# Patient Record
Sex: Female | Born: 1950
Health system: Southern US, Community
[De-identification: ages and names within clinical notes are randomized; demographics above are authoritative.]

## PROBLEM LIST (undated history)

## (undated) DIAGNOSIS — I509 Heart failure, unspecified: Secondary | ICD-10-CM

## (undated) DIAGNOSIS — Z8719 Personal history of other diseases of the digestive system: Secondary | ICD-10-CM

## (undated) DIAGNOSIS — M549 Dorsalgia, unspecified: Secondary | ICD-10-CM

## (undated) DIAGNOSIS — M1990 Unspecified osteoarthritis, unspecified site: Secondary | ICD-10-CM

## (undated) DIAGNOSIS — E785 Hyperlipidemia, unspecified: Secondary | ICD-10-CM

## (undated) DIAGNOSIS — E119 Type 2 diabetes mellitus without complications: Secondary | ICD-10-CM

## (undated) DIAGNOSIS — J449 Chronic obstructive pulmonary disease, unspecified: Secondary | ICD-10-CM

## (undated) DIAGNOSIS — I10 Essential (primary) hypertension: Secondary | ICD-10-CM

## (undated) DIAGNOSIS — G8929 Other chronic pain: Secondary | ICD-10-CM

## (undated) DIAGNOSIS — I639 Cerebral infarction, unspecified: Secondary | ICD-10-CM

## (undated) DIAGNOSIS — I471 Supraventricular tachycardia, unspecified: Secondary | ICD-10-CM

## (undated) DIAGNOSIS — I5030 Unspecified diastolic (congestive) heart failure: Secondary | ICD-10-CM

## (undated) DIAGNOSIS — J849 Interstitial pulmonary disease, unspecified: Secondary | ICD-10-CM

## (undated) DIAGNOSIS — N184 Chronic kidney disease, stage 4 (severe): Secondary | ICD-10-CM

## (undated) DIAGNOSIS — Z8673 Personal history of transient ischemic attack (TIA), and cerebral infarction without residual deficits: Secondary | ICD-10-CM

## (undated) HISTORY — PX: ABDOMINAL HYSTERECTOMY: SHX81

## (undated) HISTORY — DX: Cerebral infarction, unspecified: I63.9

## (undated) HISTORY — PX: HAND SURGERY: SHX662

## (undated) HISTORY — PX: OTHER SURGICAL HISTORY: SHX169

---

## 2001-02-17 ENCOUNTER — Emergency Department (HOSPITAL_COMMUNITY): Admission: EM | Admit: 2001-02-17 | Discharge: 2001-02-17 | Payer: Self-pay | Admitting: Emergency Medicine

## 2001-02-17 ENCOUNTER — Encounter: Payer: Self-pay | Admitting: Emergency Medicine

## 2001-04-19 ENCOUNTER — Emergency Department (HOSPITAL_COMMUNITY): Admission: EM | Admit: 2001-04-19 | Discharge: 2001-04-19 | Payer: Self-pay | Admitting: *Deleted

## 2001-07-13 ENCOUNTER — Emergency Department (HOSPITAL_COMMUNITY): Admission: EM | Admit: 2001-07-13 | Discharge: 2001-07-13 | Payer: Self-pay | Admitting: *Deleted

## 2001-09-04 ENCOUNTER — Ambulatory Visit (HOSPITAL_COMMUNITY): Admission: RE | Admit: 2001-09-04 | Discharge: 2001-09-04 | Payer: Self-pay | Admitting: Internal Medicine

## 2001-09-04 ENCOUNTER — Encounter: Payer: Self-pay | Admitting: Internal Medicine

## 2001-09-15 ENCOUNTER — Encounter: Payer: Self-pay | Admitting: Internal Medicine

## 2001-09-15 ENCOUNTER — Ambulatory Visit (HOSPITAL_COMMUNITY): Admission: RE | Admit: 2001-09-15 | Discharge: 2001-09-15 | Payer: Self-pay | Admitting: Internal Medicine

## 2001-09-30 ENCOUNTER — Encounter: Payer: Self-pay | Admitting: Internal Medicine

## 2001-09-30 ENCOUNTER — Ambulatory Visit (HOSPITAL_COMMUNITY): Admission: RE | Admit: 2001-09-30 | Discharge: 2001-09-30 | Payer: Self-pay | Admitting: Internal Medicine

## 2001-10-14 ENCOUNTER — Encounter (HOSPITAL_COMMUNITY): Admission: RE | Admit: 2001-10-14 | Discharge: 2001-11-13 | Payer: Self-pay | Admitting: Internal Medicine

## 2001-11-15 ENCOUNTER — Encounter (HOSPITAL_COMMUNITY): Admission: RE | Admit: 2001-11-15 | Discharge: 2001-12-15 | Payer: Self-pay | Admitting: Internal Medicine

## 2001-11-25 ENCOUNTER — Encounter: Payer: Self-pay | Admitting: Internal Medicine

## 2001-11-25 ENCOUNTER — Ambulatory Visit (HOSPITAL_COMMUNITY): Admission: RE | Admit: 2001-11-25 | Discharge: 2001-11-25 | Payer: Self-pay | Admitting: Internal Medicine

## 2001-12-02 ENCOUNTER — Encounter: Payer: Self-pay | Admitting: Internal Medicine

## 2001-12-02 ENCOUNTER — Ambulatory Visit (HOSPITAL_COMMUNITY): Admission: RE | Admit: 2001-12-02 | Discharge: 2001-12-02 | Payer: Self-pay | Admitting: Internal Medicine

## 2002-06-30 ENCOUNTER — Ambulatory Visit (HOSPITAL_COMMUNITY): Admission: RE | Admit: 2002-06-30 | Discharge: 2002-06-30 | Payer: Self-pay | Admitting: Internal Medicine

## 2002-06-30 ENCOUNTER — Encounter: Payer: Self-pay | Admitting: Internal Medicine

## 2002-07-10 ENCOUNTER — Encounter: Payer: Self-pay | Admitting: Internal Medicine

## 2002-07-10 ENCOUNTER — Ambulatory Visit (HOSPITAL_COMMUNITY): Admission: RE | Admit: 2002-07-10 | Discharge: 2002-07-10 | Payer: Self-pay | Admitting: Internal Medicine

## 2003-09-22 ENCOUNTER — Ambulatory Visit (HOSPITAL_COMMUNITY): Admission: RE | Admit: 2003-09-22 | Discharge: 2003-09-22 | Payer: Self-pay | Admitting: General Surgery

## 2003-09-29 ENCOUNTER — Ambulatory Visit (HOSPITAL_COMMUNITY): Admission: RE | Admit: 2003-09-29 | Discharge: 2003-09-29 | Payer: Self-pay | Admitting: General Surgery

## 2003-12-25 ENCOUNTER — Ambulatory Visit (HOSPITAL_COMMUNITY): Admission: RE | Admit: 2003-12-25 | Discharge: 2003-12-25 | Payer: Self-pay | Admitting: General Surgery

## 2004-01-19 ENCOUNTER — Encounter (HOSPITAL_COMMUNITY): Admission: RE | Admit: 2004-01-19 | Discharge: 2004-01-20 | Payer: Self-pay | Admitting: Internal Medicine

## 2004-01-21 ENCOUNTER — Ambulatory Visit (HOSPITAL_COMMUNITY): Admission: RE | Admit: 2004-01-21 | Discharge: 2004-01-21 | Payer: Self-pay | Admitting: General Surgery

## 2004-02-25 ENCOUNTER — Emergency Department (HOSPITAL_COMMUNITY): Admission: EM | Admit: 2004-02-25 | Discharge: 2004-02-25 | Payer: Self-pay | Admitting: Emergency Medicine

## 2004-08-15 ENCOUNTER — Ambulatory Visit (HOSPITAL_COMMUNITY): Admission: RE | Admit: 2004-08-15 | Discharge: 2004-08-15 | Payer: Self-pay | Admitting: General Surgery

## 2004-09-25 ENCOUNTER — Emergency Department (HOSPITAL_COMMUNITY): Admission: EM | Admit: 2004-09-25 | Discharge: 2004-09-25 | Payer: Self-pay | Admitting: Emergency Medicine

## 2005-03-03 ENCOUNTER — Ambulatory Visit (HOSPITAL_COMMUNITY): Admission: RE | Admit: 2005-03-03 | Discharge: 2005-03-03 | Payer: Self-pay | Admitting: General Surgery

## 2005-06-02 ENCOUNTER — Emergency Department (HOSPITAL_COMMUNITY): Admission: EM | Admit: 2005-06-02 | Discharge: 2005-06-02 | Payer: Self-pay | Admitting: Emergency Medicine

## 2005-08-05 ENCOUNTER — Emergency Department (HOSPITAL_COMMUNITY): Admission: EM | Admit: 2005-08-05 | Discharge: 2005-08-06 | Payer: Self-pay | Admitting: Emergency Medicine

## 2005-10-13 ENCOUNTER — Ambulatory Visit (HOSPITAL_COMMUNITY): Admission: RE | Admit: 2005-10-13 | Discharge: 2005-10-13 | Payer: Self-pay | Admitting: General Surgery

## 2006-01-17 ENCOUNTER — Ambulatory Visit (HOSPITAL_COMMUNITY): Admission: RE | Admit: 2006-01-17 | Discharge: 2006-01-17 | Payer: Self-pay | Admitting: Family Medicine

## 2006-03-20 ENCOUNTER — Ambulatory Visit (HOSPITAL_COMMUNITY): Admission: RE | Admit: 2006-03-20 | Discharge: 2006-03-20 | Payer: Self-pay | Admitting: General Surgery

## 2006-10-20 ENCOUNTER — Emergency Department (HOSPITAL_COMMUNITY): Admission: EM | Admit: 2006-10-20 | Discharge: 2006-10-20 | Payer: Self-pay | Admitting: Emergency Medicine

## 2007-03-14 ENCOUNTER — Ambulatory Visit (HOSPITAL_COMMUNITY): Admission: RE | Admit: 2007-03-14 | Discharge: 2007-03-14 | Payer: Self-pay | Admitting: Family Medicine

## 2007-04-16 ENCOUNTER — Ambulatory Visit (HOSPITAL_COMMUNITY): Admission: RE | Admit: 2007-04-16 | Discharge: 2007-04-16 | Payer: Self-pay | Admitting: Family Medicine

## 2007-09-18 ENCOUNTER — Emergency Department (HOSPITAL_COMMUNITY): Admission: EM | Admit: 2007-09-18 | Discharge: 2007-09-19 | Payer: Self-pay | Admitting: *Deleted

## 2007-09-26 HISTORY — PX: COLONOSCOPY: SHX174

## 2007-09-26 HISTORY — PX: OTHER SURGICAL HISTORY: SHX169

## 2007-11-29 ENCOUNTER — Emergency Department (HOSPITAL_COMMUNITY): Admission: EM | Admit: 2007-11-29 | Discharge: 2007-11-29 | Payer: Self-pay | Admitting: Emergency Medicine

## 2008-01-22 ENCOUNTER — Ambulatory Visit (HOSPITAL_COMMUNITY): Admission: RE | Admit: 2008-01-22 | Discharge: 2008-01-22 | Payer: Self-pay | Admitting: Internal Medicine

## 2008-01-22 ENCOUNTER — Ambulatory Visit: Payer: Self-pay | Admitting: Internal Medicine

## 2008-01-22 ENCOUNTER — Encounter: Payer: Self-pay | Admitting: Internal Medicine

## 2008-05-05 ENCOUNTER — Emergency Department (HOSPITAL_COMMUNITY): Admission: EM | Admit: 2008-05-05 | Discharge: 2008-05-05 | Payer: Self-pay | Admitting: Emergency Medicine

## 2009-08-16 ENCOUNTER — Other Ambulatory Visit: Admission: RE | Admit: 2009-08-16 | Discharge: 2009-08-16 | Payer: Self-pay | Admitting: Family Medicine

## 2010-03-31 ENCOUNTER — Ambulatory Visit (HOSPITAL_COMMUNITY): Admission: RE | Admit: 2010-03-31 | Discharge: 2010-03-31 | Payer: Self-pay | Admitting: Family Medicine

## 2010-06-30 ENCOUNTER — Emergency Department (HOSPITAL_COMMUNITY)
Admission: EM | Admit: 2010-06-30 | Discharge: 2010-06-30 | Payer: Self-pay | Source: Home / Self Care | Admitting: Emergency Medicine

## 2010-07-27 ENCOUNTER — Encounter: Payer: Self-pay | Admitting: Orthopedic Surgery

## 2010-08-04 ENCOUNTER — Ambulatory Visit: Payer: Self-pay | Admitting: Orthopedic Surgery

## 2010-08-04 DIAGNOSIS — M7020 Olecranon bursitis, unspecified elbow: Secondary | ICD-10-CM

## 2010-08-29 ENCOUNTER — Emergency Department (HOSPITAL_COMMUNITY)
Admission: EM | Admit: 2010-08-29 | Discharge: 2010-08-30 | Payer: Self-pay | Source: Home / Self Care | Admitting: Emergency Medicine

## 2010-10-16 ENCOUNTER — Encounter: Payer: Self-pay | Admitting: Family Medicine

## 2010-10-16 ENCOUNTER — Encounter: Payer: Self-pay | Admitting: General Surgery

## 2010-10-25 NOTE — Assessment & Plan Note (Signed)
Summary: left elbow effusion needs xr/uhc/hill/bsf   Vital Signs:  Patient profile:   60 year old female Height:      65 inches Weight:      190 pounds Pulse rate:   72 / minute Resp:     16 per minute  Vitals Entered By: Arther Abbott MD (August 04, 2010 8:59 AM)  Visit Type:  new patient Referring Provider:  Dr. Iona Beard Primary Provider:  Dr. Iona Beard  CC:  left elbow.  History of Present Illness: I saw Erica [name] in the office today for an initial visit.  She is a 60 years old woman with the complaint of:  left elbow pain and swelling.  No Injury.  Xrays today.  Meds: Oxycontin 40mg , Diovan+/HCTZ, Lipitor, Tekturna, Onglyza, ASA.  This is a 60 year old, diabetic female, with 2 months of pain and swelling over the LEFT elbow. No associated trauma. She complains of constant 10 out of 10 sharp pain over the LEFT elbow with no evidence of previous elbow pressure or resting arms on anything. Of note. She is diabetic   Allergies (verified): No Known Drug Allergies  Past History:  Past Medical History: Back pain htn arthritis Diabetes Bronchitis  Past Surgical History: left middle finger  Family History: Family History of Diabetes Family History of Arthritis  Social History: Patient is divorced.  unemployed smokes 1/2 ppd cigs no alcohol coffee in the am 11th grade ed.  Review of Systems Constitutional:  Denies weight loss, weight gain, fever, chills, and fatigue. Cardiovascular:  Denies chest pain, palpitations, fainting, and murmurs. Respiratory:  Complains of wheezing and couch; denies short of breath, tightness, pain on inspiration, and snoring . Gastrointestinal:  Denies heartburn, nausea, vomiting, diarrhea, constipation, and blood in your stools. Genitourinary:  Complains of frequency; denies urgency, difficulty urinating, painful urination, flank pain, and bleeding in urine. Neurologic:  Denies numbness, tingling, unsteady gait,  dizziness, tremors, and seizure. Musculoskeletal:  Complains of joint pain; denies swelling, instability, stiffness, redness, heat, and muscle pain. Endocrine:  Denies excessive thirst, exessive urination, and heat or cold intolerance. Psychiatric:  Denies nervousness, depression, anxiety, and hallucinations. Skin:  Denies changes in the skin, poor healing, rash, itching, and redness. HEENT:  Complains of watering; denies blurred or double vision, eye pain, and redness. Immunology:  Denies seasonal allergies, sinus problems, and allergic to bee stings. Hemoatologic:  Denies easy bleeding and brusing.  Physical Exam  Skin:  intact without lesions or rashes, skin of LEFT arm and elbow. Dark. Axillary Nodes:  tender axillary nodes without streaking Psych:  alert and cooperative; normal mood and affect; normal attention span and concentration   Shoulder/Elbow Exam  General:    Well-developed, well-nourished, normal body habitus; no deformities, normal grooming.    Inspection:    swelling: LEFT elbow, LEFT forearm  Palpation:     tenderness LEFT elbow, and forearm  Vascular:    Radial, ulnar, brachial, and axillary pulses 2+ and symmetric; capillary refill less than 2 seconds; no evidence of ischemia, clubbing, or cyanosis.    Sensory:    Gross sensation intact in the upper extremities.    Motor:    Normal strength in the upper extremities.    Elbow Exam:    Left:    Stability:  stable    Inspection/Palpation:  full flexion, extension of the elbow with discomfort. Large mass over the LEFT elbow associated with bursitis   Impression & Recommendations:  Problem # 1:  OLECRANON BURSITIS, LEFT (ICD-726.33) Assessment New  x-ray AP, lateral, LEFT elbow, show some soft tissue swelling over the LEFT olecranon with a small olecranon spur. There is mild arthritis on the ulnar side of the joint with a small spur there as well. Ulnohumeral joint is normal.  Aspiration injection LEFT  elbow bursa through  After sterile prep and drape in use of ethyl chloride for anesthesia. Approximately 4 cc of yellow orange fluid was aspirated from the bursa. There were no infectious material.  We injected a cc of Depo-Medrol and lidocaine mixture.  Application of Ace wrap.  Orders: New Patient Level III [id number]) Elbow x-ray, 2 views [id number]) Joint Aspirate / Injection, Intermediate (20605) Depo- Medrol 40mg  (J1030)  Patient Instructions: 1)  Wear ace 48 hrs  2)  Ice for 20 min three times a day x 48 hrs  3)  return as needed    Orders Added: 1)  New Patient Level III [id number] 2)  Elbow x-ray, 2 views [id number] 3)  Joint Aspirate / Injection, Intermediate [id number] 4)  Depo- Medrol 40mg  [id number]

## 2010-10-25 NOTE — Letter (Signed)
Summary: *Orthopedic Consult Note  [facility]  [address] [place] [address]  [place], [place] [address]   Phone: [phone]  Fax: [phone]    Re:    Erica [name] DOB:    July 31, 1951   Dear: Berneta Sages    Thank you for requesting that we see the above patient for consultation.  A copy of the detailed office note will be sent under separate cover, for your review.  Evaluation today is consistent with:  1)  OLECRANON BURSITIS, LEFT (ICD-726.33) 2)  FAMILY HISTORY OF ARTHRITIS (ICD-V17.7) 3)  FAMILY HISTORY OF DIABETES (ICD-V18.0)   an x-ray was obtained, and it was essentially normal except for soft tissue swelling, and some mild arthritis.  We also aspirated and injected the LEFT elbow bursa.         Thank you for this opportunity to look after your patient.  Sincerely,   Demetrius Revel. MD.

## 2010-10-28 NOTE — Letter (Signed)
Summary: history form   history form   Imported By: Ruffin Pyo 08/04/2010 10:32:00  _____________________________________________________________________  External Attachment:    Type:   Image     Comment:   External Document

## 2010-12-05 LAB — RAPID STREP SCREEN (MED CTR MEBANE ONLY): Streptococcus, Group A Screen (Direct): POSITIVE — AB

## 2011-02-07 NOTE — Op Note (Signed)
Erica [name], Erica [name]              ACCOUNT NO.:  [id number]   MEDICAL RECORD NO.:  [id number]          PATIENT TYPE:  AMB   LOCATION:  DAY                           FACILITY:  [facility]   PHYSICIAN:  R. Garfield Cornea, M.D. DATE OF BIRTH:  [birth year]   DATE OF PROCEDURE:  01/22/2008  DATE OF DISCHARGE:                               OPERATIVE REPORT   Colonoscopy with biopsy.   INDICATIONS FOR PROCEDURE:  The patient is a very pleasant 60 year old  [place] American female sent over courtesy of  Zannie Cove, PAC at  [facility] in [place], [place] for  colorectal cancer screening via colonoscopy.  She has never had her  lower GI tract imaged, she has no lower GI tract symptoms.  There is no  family history of colorectal neoplasia.  Colonoscopy is now being done  as a screening maneuver.  The risks, benefits, alternatives, and  limitations have been reviewed.  Questions answered and all parties  agreeable.   PROCEDURE NOTE:  O2 saturation, blood pressure, pulse, and respirations  monitored throughout the entire procedure.   CONSCIOUS SEDATION:  1. Versed 3 mg IV.  2. Demerol 75 mg IV in divided doses.   INSTRUMENT:  Pentax video chip system.   FINDINGS:  Digital rectal exam revealed no abnormalities.  Endoscopic  findings:  The prep was good.  Colon:  Colonic mucosa was surveyed from  the rectosigmoid junction through the left, transverse, and right colon,  to the appendiceal orifice, ileocecal valve, and cecum.  These  structures were well seen and photographed for the record.  From this  level, scope was slowly withdrawn.  All previously mentioned mucosal  surfaces were again seen.  The patient had 2 diminutive polyps in the  rectosigmoid junction.  The  remainder of the colonic mucosa appeared  normal.  These were cold biopsied/removed.  The scope was pulled down  the rectum where thorough examination of rectal mucosa, including  retroflexed view of  anal verge, demonstrated cluster of distal  diminutive rectal polyps.  Four were cold biopsied/removed.  The  reminder of the rectal mucosa appeared normal.  The patient tolerated  the procedure well and was reactive in endoscopy.   IMPRESSION:  1. Distal diminutive rectal polyps status post cold biopsy/removal.  2. Rectosigmoid polyps diminutive lesions, cold biopsy/removal.  3. The remainder of colonic mucosa appeared normal.   RECOMMENDATIONS:  1. We will follow up on path.  2. Further recommendations to follow.      Bridgette Habermann, M.D.  Electronically Signed     RMR/MEDQ  D:  01/22/2008  T:  01/23/2008  Job:  [id number]   cc:   Zannie Cove, [place], [place]

## 2011-02-10 NOTE — Procedures (Signed)
Erica [name], Erica [name]                        ACCOUNT NO.:  [id number]   MEDICAL RECORD NO.:  [id number]                   PATIENT TYPE:  OUT   LOCATION:  RAD                                  FACILITY:  [facility]   PHYSICIAN:  Leslye Peer, MD                    DATE OF BIRTH:  08-Dec-1950   DATE OF PROCEDURE:  DATE OF DISCHARGE:                                  ECHOCARDIOGRAM   INDICATION:  Ms. Kostiuk is a 60 year old female smoker, who has hypertension  and has been experiencing lower-extremity edema as well as cough.  She is  referred to evaluate for the possibility of congestive heart failure.   TECHNICAL QUALITY:  Adequate.   FINDINGS:  1. The aorta is within normal limits at 2.4 cm.  2. The left atrium is also at the upper limits of normal at 3.9 cm.  3. The patient appeared to be in sinus rhythm during this procedure.  4. The intraventricular septum and posterior wall are both mildly thickened     at 1.3 cm for each.  5. The aortic valve is not completely visualized, but appears grossly     structurally normal with normal leaflet excursion.  No aortic     insufficiency is appreciated.  6. Doppler interrogation of the aortic valve is within normal limits.  7. The mitral valve also appears grossly structurally normal, although it     may be mildly thickened. Leaflet excursion is normal.  There is a subtle     suggestion of prolapse of the anterior leaflet of the mitral valve, but     there is some dropout obscuring this definitive diagnosis.  There is no     significant mitral regurgitant lesion noted.  Doppler interrogation of     the mitral valve is within normal limits.  8. The pulmonic valve is incompletely visualized, but appears grossly     structurally normal.  9. Trivial pulmonic insufficiency is noted.  10.      The tricuspid valve also appears grossly structurally normal, and     no significant tricuspid regurgitation is noted.  11.      The left ventricle is normal in  size with the LBIDD measuring 4.1     cm, and the LVISD measured at 2.5 cm.  Overall left ventricular systolic     function appears normal to vigorous, and no regional wall motion     abnormalities are noted.  There is a subtle suggestion of mild diastolic     dysfunction by pulse wave Doppler across the mitral valve.  12.      The right-sided structures appear normal in size.  The right     ventricular systolic function appears normal.  13.      The IVC is not well visualized.  There is a small posterior and     apical pericardial effusion without evidence of  hemodynamic compromise/     tamponade.  I can not exclude the possibility of a small anterior     pericardial effusion as well, but this is not well visualized.   IMPRESSION:  1. Mild, concentric left ventricular hypertrophy.  2. Trivial pulmonic insufficiency.  3. Subtle suggestion of prolapse of the anterior leaflet of the mitral     valve, although there is some dropout to preclude definitive diagnosis;     however, there is no significant mitral regurgitant lesion noted.  4. Normal left ventricular size with normal-to-vigorous left ventricular     systolic function, and no regional wall motion abnormalities noted.  5. There is a suggestion of mild diastolic dysfunction.  6. Small posterior and apical pericardial effusion without evidence of     hemodynamic compromise/ tamponade.  I can not exclude the possibility of     a small anterior pericardial effusion as well.      ___________________________________________                                            Leslye Peer, MD   AB/MEDQ  D:  12/25/2003  T:  12/26/2003  Job:  [id number]   cc:   Vernon Prey. Tamala Julian, M.D.  [address]  [place]  [place] [address]  Fax: [phone]

## 2011-06-19 LAB — URINALYSIS, ROUTINE W REFLEX MICROSCOPIC
Bilirubin Urine: NEGATIVE
Glucose, UA: NEGATIVE
Hgb urine dipstick: NEGATIVE
Ketones, ur: NEGATIVE
Nitrite: NEGATIVE
Specific Gravity, Urine: 1.025
pH: 5

## 2011-06-19 LAB — COMPREHENSIVE METABOLIC PANEL
ALT: 19
AST: 15
Albumin: 4
Alkaline Phosphatase: 72
Calcium: 9.3
GFR calc Af Amer: 54 — ABNORMAL LOW
Glucose, Bld: 121 — ABNORMAL HIGH
Potassium: 4.2
Sodium: 138
Total Protein: 7.1

## 2011-06-19 LAB — DIFFERENTIAL
Basophils Absolute: 0
Basophils Relative: 0
Lymphocytes Relative: 21
Monocytes Absolute: 0.5
Neutro Abs: 4.8
Neutrophils Relative %: 70

## 2011-06-19 LAB — CBC
Hemoglobin: 15.3 — ABNORMAL HIGH
RBC: 5.36 — ABNORMAL HIGH
RDW: 13.8

## 2011-06-19 LAB — LIPASE, BLOOD: Lipase: 23

## 2011-06-30 LAB — URINALYSIS, ROUTINE W REFLEX MICROSCOPIC
Bilirubin Urine: NEGATIVE
Glucose, UA: NEGATIVE
Hgb urine dipstick: NEGATIVE
Specific Gravity, Urine: 1.025
Urobilinogen, UA: 0.2
pH: 5.5

## 2011-06-30 LAB — LIPASE, BLOOD: Lipase: 24

## 2011-06-30 LAB — CBC
Hemoglobin: 14.7
MCHC: 34.4
MCV: 81.4
RBC: 5.26 — ABNORMAL HIGH
WBC: 6.8

## 2011-06-30 LAB — COMPREHENSIVE METABOLIC PANEL
ALT: 18
AST: 22
CO2: 29
Chloride: 101
Creatinine, Ser: 0.81
GFR calc Af Amer: >60
GFR calc non Af Amer: >60
Glucose, Bld: 131 — ABNORMAL HIGH
Total Bilirubin: 1.1

## 2011-06-30 LAB — DIFFERENTIAL
Basophils Absolute: 0
Eosinophils Absolute: 0.1
Eosinophils Relative: 2
Neutrophils Relative %: 46

## 2013-03-14 ENCOUNTER — Emergency Department (HOSPITAL_COMMUNITY)
Admission: EM | Admit: 2013-03-14 | Discharge: 2013-03-14 | Disposition: A | Discharge status: Home / Self Care | Payer: Medicare Other | Attending: Emergency Medicine | Admitting: Emergency Medicine

## 2013-03-14 ENCOUNTER — Emergency Department (HOSPITAL_COMMUNITY): Payer: Medicare Other

## 2013-03-14 ENCOUNTER — Encounter (HOSPITAL_COMMUNITY): Payer: Self-pay | Admitting: *Deleted

## 2013-03-14 DIAGNOSIS — M545 Low back pain, unspecified: Secondary | ICD-10-CM | POA: Insufficient documentation

## 2013-03-14 DIAGNOSIS — Z90710 Acquired absence of both cervix and uterus: Secondary | ICD-10-CM | POA: Insufficient documentation

## 2013-03-14 DIAGNOSIS — J449 Chronic obstructive pulmonary disease, unspecified: Secondary | ICD-10-CM | POA: Insufficient documentation

## 2013-03-14 DIAGNOSIS — N949 Unspecified condition associated with female genital organs and menstrual cycle: Secondary | ICD-10-CM | POA: Insufficient documentation

## 2013-03-14 DIAGNOSIS — J4489 Other specified chronic obstructive pulmonary disease: Secondary | ICD-10-CM | POA: Insufficient documentation

## 2013-03-14 DIAGNOSIS — R109 Unspecified abdominal pain: Secondary | ICD-10-CM | POA: Insufficient documentation

## 2013-03-14 DIAGNOSIS — G8929 Other chronic pain: Secondary | ICD-10-CM | POA: Insufficient documentation

## 2013-03-14 DIAGNOSIS — F17200 Nicotine dependence, unspecified, uncomplicated: Secondary | ICD-10-CM | POA: Insufficient documentation

## 2013-03-14 HISTORY — DX: Other chronic pain: G89.29

## 2013-03-14 HISTORY — DX: Dorsalgia, unspecified: M54.9

## 2013-03-14 HISTORY — DX: Chronic obstructive pulmonary disease, unspecified: J44.9

## 2013-03-14 LAB — URINALYSIS, ROUTINE W REFLEX MICROSCOPIC
Bilirubin Urine: NEGATIVE
Ketones, ur: NEGATIVE mg/dL
Nitrite: NEGATIVE
Protein, ur: NEGATIVE mg/dL
Urobilinogen, UA: 0.2 mg/dL (ref 0.0–1.0)

## 2013-03-14 MED ORDER — CYCLOBENZAPRINE HCL 10 MG PO TABS: 10.0000 mg | ORAL_TABLET | Freq: Two times a day (BID) | ORAL | Status: DC | PRN

## 2013-03-14 MED ORDER — NAPROXEN 500 MG PO TABS: 500.0000 mg | ORAL_TABLET | Freq: Two times a day (BID) | ORAL | Status: DC

## 2013-03-14 MED ORDER — OXYCODONE-ACETAMINOPHEN 5-325 MG PO TABS
2.0000 | ORAL_TABLET | Freq: Once | ORAL | Status: AC
  Administered 2013-03-14: 2 via ORAL
  Filled 2013-03-14: qty 2

## 2013-03-14 NOTE — ED Notes (Signed)
Pt states left flank pain since last night. Denies urinary symptoms or vomiting. Pt states nausea at times.

## 2013-03-14 NOTE — ED Provider Notes (Signed)
History    This chart was scribed for Nat Christen, MD by Roxan Diesel, ED scribe.  This patient was seen in room APA03/APA03 and the patient's care was started at 4:39 PM.   CSN: [id number]  Arrival date & time 03/14/13  1558      Chief Complaint  Patient presents with  . Flank Pain     The history is provided by the patient. No language interpreter was used.    HPI Comments: Erica [name] is a 62 y.o. female with h/o chronic lower back pain who presents to the Emergency Department complaining of constant, moderate left posterior pelvis and left flank pain that began last night.  Pain is described as sharp and exacerbated by walking.  Pt denies injury that may have caused pain.  She denies h/o similar pain.  She denies urinary symptoms, fever, sore throat, CP, cough, SOB, abdominal pain, nausea, emesis, diarrhea, weakness, numbness, rash or any other associated symptoms.      Past Medical History  Diagnosis Date  . COPD (chronic obstructive pulmonary disease)   . Chronic back pain     Past Surgical History  Procedure Laterality Date  . Abdominal hysterectomy      No family history on file.  History  Substance Use Topics  . Smoking status: Current Every Day Smoker  . Smokeless tobacco: Not on file  . Alcohol Use: No    OB History   Grav Para Term Preterm Abortions TAB SAB Ect Mult Living                  Review of Systems A complete 10 system review of systems was obtained and all systems are negative except as noted in the HPI and PMH.    Allergies  Review of patient's allergies indicates no known allergies.  Home Medications  No current outpatient prescriptions on file.  BP 133/55  Temp(Src) 98.4 F (36.9 C) (Oral)  Resp 16  Ht 5\' 4"  (1.626 m)  Wt 189 lb (85.73 kg)  BMI 32.43 kg/m2  SpO2 94%  Physical Exam  Nursing note and vitals reviewed. Constitutional: She is oriented to person, place, and time. She appears well-developed and  well-nourished.  HENT:  Head: Normocephalic and atraumatic.  Eyes: Conjunctivae and EOM are normal. Pupils are equal, round, and reactive to light.  Neck: Normal range of motion. Neck supple.  Cardiovascular: Normal rate, regular rhythm and normal heart sounds.   Pulmonary/Chest: Effort normal and breath sounds normal.  Abdominal: Soft. Bowel sounds are normal.  Musculoskeletal: Normal range of motion. She exhibits tenderness.  Tenderness to left posterior pelvis and left flank  Neurological: She is alert and oriented to person, place, and time.  Skin: Skin is warm and dry.  Psychiatric: She has a normal mood and affect.    ED Course  Procedures (including critical care time)  DIAGNOSTIC STUDIES: Oxygen Saturation is 94% on room air, adequate by my interpretation.    COORDINATION OF CARE: 4:43 PM-Discussed treatment plan which includes pain medication, UA and pelvis x-ray with pt at bedside and pt agreed to plan.    Results for orders placed during the hospital encounter of 03/14/13  URINALYSIS, ROUTINE W REFLEX MICROSCOPIC      Result Value Range   Color, Urine YELLOW  YELLOW   APPearance CLEAR  CLEAR   Specific Gravity, Urine 1.025  1.005 - 1.030   pH 6.0  5.0 - 8.0   Glucose, UA NEGATIVE  NEGATIVE mg/dL  Hgb urine dipstick NEGATIVE  NEGATIVE   Bilirubin Urine NEGATIVE  NEGATIVE   Ketones, ur NEGATIVE  NEGATIVE mg/dL   Protein, ur NEGATIVE  NEGATIVE mg/dL   Urobilinogen, UA 0.2  0.0 - 1.0 mg/dL   Nitrite NEGATIVE  NEGATIVE   Leukocytes, UA NEGATIVE  NEGATIVE    Dg Pelvis 1-2 Views  03/14/2013   *RADIOLOGY REPORT*  Clinical Data: Left sided posterior pelvic pain.  PELVIS - 1-2 VIEW  Comparison: None.  Findings: There is no fracture, dislocation, bone destruction, or other significant abnormality.  Minimal arthritic changes at the SI joints and hips and symphysis pubis.  IMPRESSION: No acute abnormality.   Original Report Authenticated By: Lorriane Shire, M.D.      No  diagnosis found.    MDM  Patient is most tender in the left superior lateral pelvis.   No frank CVA tenderness.   Urinalysis and pelvic x-ray normal. Discharge medications Flexeril and Naprosyn.      I personally performed the services described in this documentation, which was scribed in my presence. The recorded information has been reviewed and is accurate.    Nat Christen, MD 03/19/13 [phone]

## 2013-03-14 NOTE — ED Notes (Signed)
Pain in left flank area, worse with movement

## 2013-03-14 NOTE — ED Notes (Signed)
Pt alert & oriented x4, stable gait. Patient given discharge instructions, paperwork & prescription(s). Patient  instructed to stop at the registration desk to finish any additional paperwork. Patient verbalized understanding. Pt left department w/ no further questions. 

## 2013-11-12 ENCOUNTER — Encounter (HOSPITAL_COMMUNITY): Payer: Self-pay | Admitting: Emergency Medicine

## 2013-11-12 ENCOUNTER — Emergency Department (HOSPITAL_COMMUNITY)
Admission: EM | Admit: 2013-11-12 | Discharge: 2013-11-12 | Disposition: A | Discharge status: Home / Self Care | Payer: Medicare Other | Attending: Emergency Medicine | Admitting: Emergency Medicine

## 2013-11-12 DIAGNOSIS — J441 Chronic obstructive pulmonary disease with (acute) exacerbation: Secondary | ICD-10-CM | POA: Insufficient documentation

## 2013-11-12 DIAGNOSIS — Z7982 Long term (current) use of aspirin: Secondary | ICD-10-CM | POA: Insufficient documentation

## 2013-11-12 DIAGNOSIS — F17200 Nicotine dependence, unspecified, uncomplicated: Secondary | ICD-10-CM | POA: Insufficient documentation

## 2013-11-12 DIAGNOSIS — G8929 Other chronic pain: Secondary | ICD-10-CM | POA: Insufficient documentation

## 2013-11-12 DIAGNOSIS — B86 Scabies: Secondary | ICD-10-CM | POA: Insufficient documentation

## 2013-11-12 DIAGNOSIS — Z79899 Other long term (current) drug therapy: Secondary | ICD-10-CM | POA: Insufficient documentation

## 2013-11-12 DIAGNOSIS — Z791 Long term (current) use of non-steroidal anti-inflammatories (NSAID): Secondary | ICD-10-CM | POA: Insufficient documentation

## 2013-11-12 MED ORDER — PERMETHRIN 5 % EX CREA: TOPICAL_CREAM | CUTANEOUS | Status: DC

## 2013-11-12 MED ORDER — ALBUTEROL SULFATE (2.5 MG/3ML) 0.083% IN NEBU: 2.5000 mg | INHALATION_SOLUTION | Freq: Once | RESPIRATORY_TRACT | Status: DC

## 2013-11-12 MED ORDER — ALBUTEROL SULFATE HFA 108 (90 BASE) MCG/ACT IN AERS
2.0000 | INHALATION_SPRAY | Freq: Once | RESPIRATORY_TRACT | Status: AC
  Administered 2013-11-12: 2 via RESPIRATORY_TRACT
  Filled 2013-11-12: qty 6.7

## 2013-11-12 MED ORDER — IPRATROPIUM-ALBUTEROL 0.5-2.5 (3) MG/3ML IN SOLN
RESPIRATORY_TRACT | Status: AC
  Filled 2013-11-12: qty 3

## 2013-11-12 MED ORDER — IPRATROPIUM BROMIDE 0.02 % IN SOLN: 0.5000 mg | Freq: Once | RESPIRATORY_TRACT | Status: DC

## 2013-11-12 MED ORDER — IPRATROPIUM-ALBUTEROL 0.5-2.5 (3) MG/3ML IN SOLN
3.0000 mL | Freq: Once | RESPIRATORY_TRACT | Status: AC
  Administered 2013-11-12: 3 mL via RESPIRATORY_TRACT
  Filled 2013-11-12: qty 3

## 2013-11-12 NOTE — Discharge Instructions (Signed)
Scabies Scabies are small bugs (mites) that burrow under the skin and cause red bumps and severe itching. These bugs can only be seen with a microscope. Scabies are highly contagious. They can spread easily from person to person by direct contact. They are also spread through sharing clothing or linens that have the scabies mites living in them. It is not unusual for an entire family to become infected through shared towels, clothing, or bedding.  HOME CARE INSTRUCTIONS   Your caregiver may prescribe a cream or lotion to kill the mites. If cream is prescribed, massage the cream into the entire body from the neck to the bottom of both feet. Also massage the cream into the scalp and face if your child is less than 51 year old. Avoid the eyes and mouth. Do not wash your hands after application.  Leave the cream on for 8 to 12 hours. Your child should bathe or shower after the 8 to 12 hour application period. Sometimes it is helpful to apply the cream to your child right before bedtime.  One treatment is usually effective and will eliminate approximately 95% of infestations. For severe cases, your caregiver may decide to repeat the treatment in 1 week. Everyone in your household should be treated with one application of the cream.  New rashes or burrows should not appear within 24 to 48 hours after successful treatment. However, the itching and rash may last for 2 to 4 weeks after successful treatment. Your caregiver may prescribe a medicine to help with the itching or to help the rash go away more quickly.  Scabies can live on clothing or linens for up to 3 days. All of your child's recently used clothing, towels, stuffed toys, and bed linens should be washed in hot water and then dried in a dryer for at least 20 minutes on high heat. Items that cannot be washed should be enclosed in a plastic bag for at least 3 days.  To help relieve itching, bathe your child in a cool bath or apply cool washcloths to the  affected areas.  Your child may return to school after treatment with the prescribed cream. SEEK MEDICAL CARE IF:   The itching persists longer than 4 weeks after treatment.  The rash spreads or becomes infected. Signs of infection include red blisters or yellow-tan crust. Document Released: 09/11/2005 Document Revised: 12/04/2011 Document Reviewed: 01/20/2009 [facility] Patient Information 2014 [place].  Apply cream, do not bathe for 12 hours after cream application Wash linens, towels, clothes in hot water (see above)

## 2013-11-12 NOTE — ED Provider Notes (Signed)
CSN: [id number]     Arrival date & time 11/12/13  1015 History   First MD Initiated Contact with Patient 11/12/13 1117     Chief Complaint  Patient presents with  . Rash     (Consider location/radiation/quality/duration/timing/severity/associated sxs/prior Treatment) Patient is a 63 y.o. female presenting with rash. The history is provided by the patient. No language interpreter was used.  Rash Associated symptoms: no fever, no joint pain and no myalgias    Pt is a 63 year old female who presents with a rash on her left arm. She reports that it is itchy and raised. She denies recent illness or fever. She has tried an OTC cream with no relief. She also has COPD and is currently out of albuterol.    Past Medical History  Diagnosis Date  . COPD (chronic obstructive pulmonary disease)   . Chronic back pain    Past Surgical History  Procedure Laterality Date  . Abdominal hysterectomy     No family history on file. History  Substance Use Topics  . Smoking status: Current Every Day Smoker  . Smokeless tobacco: Not on file  . Alcohol Use: No   OB History   Grav Para Term Preterm Abortions TAB SAB Ect Mult Living                 Review of Systems  Constitutional: Negative for fever and chills.  Musculoskeletal: Negative for arthralgias, joint swelling and myalgias.  Skin: Positive for rash.       Itchy, maculopapular rash      Allergies  Review of patient's allergies indicates no known allergies.  Home Medications   Current Outpatient Rx  Name  Route  Sig  Dispense  Refill  . aspirin EC 81 MG tablet   Oral   Take 162 mg by mouth every morning.         . gabapentin (NEURONTIN) 100 MG capsule   Oral   Take 1 capsule by mouth daily.         . meloxicam (MOBIC) 15 MG tablet   Oral   Take 1 tablet by mouth at bedtime.         . metFORMIN (GLUCOPHAGE) 500 MG tablet   Oral   Take 500 mg by mouth 2 (two) times daily.         Marland Kitchen oxymorphone (OPANA ER) 7.5 MG  TB12   Oral   Take 7.5 mg by mouth daily.         . saxagliptin HCl (ONGLYZA) 5 MG TABS tablet   Oral   Take 5 mg by mouth daily.         Marland Kitchen tiotropium (SPIRIVA) 18 MCG inhalation capsule   Inhalation   Place 18 mcg into inhaler and inhale 2 (two) times daily.         . valsartan-hydrochlorothiazide (DIOVAN-HCT) 160-12.5 MG per tablet   Oral   Take 1 tablet by mouth daily.          BP 166/64  Pulse 87  Temp(Src) 98.5 F (36.9 C)  Resp 20  Ht 5\' 4"  (1.626 m)  Wt 182 lb (82.555 kg)  BMI 31.22 kg/m2  SpO2 97% Physical Exam  Nursing note and vitals reviewed. Constitutional: She is oriented to person, place, and time. She appears well-developed and well-nourished.  HENT:  Head: Normocephalic and atraumatic.  Eyes: Conjunctivae and EOM are normal.  Neck: Normal range of motion. Neck supple. No JVD present. No tracheal deviation present.  No thyromegaly present.  Cardiovascular: Normal rate, regular rhythm and normal heart sounds.   Pulmonary/Chest: Effort normal.  Mild wheezing in upper and middle lung fields bilaterally.   Musculoskeletal: Normal range of motion.  Neurological: She is alert and oriented to person, place, and time.  Skin: Skin is warm and dry. Rash noted.  Itchy, maculopapular rash.  Psychiatric: She has a normal mood and affect. Her behavior is normal. Judgment and thought content normal.    ED Course  Procedures (including critical care time) Labs Review Labs Reviewed - No data to display Imaging Review No results found.  EKG Interpretation   None     Pt with bilateral wheezes on exam. Will give duoneb here and send home with albuterol inhaler. No hypoxia, shortness of breath or tachypnea.   MDM   Final diagnoses:  Scabies    Rash consistent with the appearance of scabies. Permethrin cream prescribed and instructed pt on use. Discussed plan of care and pt agrees. Albuterol inhaler given to take home. Slight wheezing on exam but cleared  after duoneb x1. Follow-up with PCP.     Elisha Headland, NP 11/16/13 1840

## 2013-11-12 NOTE — ED Notes (Signed)
Pt c/o itchy rash to left arm x 1 week.  Reports has been using an otc cream without relief.

## 2013-11-17 NOTE — ED Provider Notes (Signed)
Medical screening examination/treatment/procedure(s) were performed by non-physician practitioner and as supervising physician I was immediately available for consultation/collaboration.  EKG Interpretation   None         Maudry Diego, MD 11/17/13 [id number]

## 2014-05-13 ENCOUNTER — Other Ambulatory Visit (HOSPITAL_COMMUNITY): Payer: Self-pay | Admitting: Family Medicine

## 2014-05-13 DIAGNOSIS — Z1231 Encounter for screening mammogram for malignant neoplasm of breast: Secondary | ICD-10-CM

## 2014-05-20 ENCOUNTER — Ambulatory Visit (HOSPITAL_COMMUNITY): Payer: Medicare Other

## 2014-11-04 DIAGNOSIS — J449 Chronic obstructive pulmonary disease, unspecified: Secondary | ICD-10-CM | POA: Diagnosis not present

## 2014-12-03 DIAGNOSIS — J449 Chronic obstructive pulmonary disease, unspecified: Secondary | ICD-10-CM | POA: Diagnosis not present

## 2014-12-08 DIAGNOSIS — E118 Type 2 diabetes mellitus with unspecified complications: Secondary | ICD-10-CM | POA: Diagnosis not present

## 2014-12-08 DIAGNOSIS — J449 Chronic obstructive pulmonary disease, unspecified: Secondary | ICD-10-CM | POA: Diagnosis not present

## 2014-12-08 DIAGNOSIS — R739 Hyperglycemia, unspecified: Secondary | ICD-10-CM | POA: Diagnosis not present

## 2014-12-08 DIAGNOSIS — I10 Essential (primary) hypertension: Secondary | ICD-10-CM | POA: Diagnosis not present

## 2014-12-08 DIAGNOSIS — M545 Low back pain: Secondary | ICD-10-CM | POA: Diagnosis not present

## 2015-01-03 DIAGNOSIS — J449 Chronic obstructive pulmonary disease, unspecified: Secondary | ICD-10-CM | POA: Diagnosis not present

## 2015-01-28 DIAGNOSIS — H524 Presbyopia: Secondary | ICD-10-CM | POA: Diagnosis not present

## 2015-01-28 DIAGNOSIS — H5203 Hypermetropia, bilateral: Secondary | ICD-10-CM | POA: Diagnosis not present

## 2015-01-28 DIAGNOSIS — H5210 Myopia, unspecified eye: Secondary | ICD-10-CM | POA: Diagnosis not present

## 2015-01-28 DIAGNOSIS — H52209 Unspecified astigmatism, unspecified eye: Secondary | ICD-10-CM | POA: Diagnosis not present

## 2015-02-02 DIAGNOSIS — J449 Chronic obstructive pulmonary disease, unspecified: Secondary | ICD-10-CM | POA: Diagnosis not present

## 2015-03-05 DIAGNOSIS — J449 Chronic obstructive pulmonary disease, unspecified: Secondary | ICD-10-CM | POA: Diagnosis not present

## 2015-03-08 DIAGNOSIS — E118 Type 2 diabetes mellitus with unspecified complications: Secondary | ICD-10-CM | POA: Diagnosis not present

## 2015-03-08 DIAGNOSIS — J449 Chronic obstructive pulmonary disease, unspecified: Secondary | ICD-10-CM | POA: Diagnosis not present

## 2015-03-08 DIAGNOSIS — R634 Abnormal weight loss: Secondary | ICD-10-CM | POA: Diagnosis not present

## 2015-03-08 DIAGNOSIS — I10 Essential (primary) hypertension: Secondary | ICD-10-CM | POA: Diagnosis not present

## 2015-03-26 DIAGNOSIS — J449 Chronic obstructive pulmonary disease, unspecified: Secondary | ICD-10-CM | POA: Diagnosis not present

## 2015-04-04 DIAGNOSIS — J449 Chronic obstructive pulmonary disease, unspecified: Secondary | ICD-10-CM | POA: Diagnosis not present

## 2015-04-26 DIAGNOSIS — J449 Chronic obstructive pulmonary disease, unspecified: Secondary | ICD-10-CM | POA: Diagnosis not present

## 2015-05-04 ENCOUNTER — Ambulatory Visit (HOSPITAL_COMMUNITY)
Admission: RE | Admit: 2015-05-04 | Discharge: 2015-05-04 | Disposition: A | Discharge status: Home / Self Care | Payer: Commercial Managed Care - HMO | Source: Ambulatory Visit | Attending: Family Medicine | Admitting: Family Medicine

## 2015-05-04 ENCOUNTER — Other Ambulatory Visit (HOSPITAL_COMMUNITY): Payer: Self-pay | Admitting: Family Medicine

## 2015-05-04 DIAGNOSIS — Z720 Tobacco use: Secondary | ICD-10-CM | POA: Insufficient documentation

## 2015-05-04 DIAGNOSIS — R634 Abnormal weight loss: Secondary | ICD-10-CM | POA: Diagnosis not present

## 2015-05-04 DIAGNOSIS — F17200 Nicotine dependence, unspecified, uncomplicated: Secondary | ICD-10-CM

## 2015-05-04 DIAGNOSIS — R05 Cough: Secondary | ICD-10-CM | POA: Diagnosis not present

## 2015-05-05 DIAGNOSIS — J449 Chronic obstructive pulmonary disease, unspecified: Secondary | ICD-10-CM | POA: Diagnosis not present

## 2015-05-27 DIAGNOSIS — J449 Chronic obstructive pulmonary disease, unspecified: Secondary | ICD-10-CM | POA: Diagnosis not present

## 2015-06-05 DIAGNOSIS — J449 Chronic obstructive pulmonary disease, unspecified: Secondary | ICD-10-CM | POA: Diagnosis not present

## 2015-06-15 DIAGNOSIS — E118 Type 2 diabetes mellitus with unspecified complications: Secondary | ICD-10-CM | POA: Diagnosis not present

## 2015-06-15 DIAGNOSIS — I10 Essential (primary) hypertension: Secondary | ICD-10-CM | POA: Diagnosis not present

## 2015-06-15 DIAGNOSIS — J449 Chronic obstructive pulmonary disease, unspecified: Secondary | ICD-10-CM | POA: Diagnosis not present

## 2015-06-15 DIAGNOSIS — R0902 Hypoxemia: Secondary | ICD-10-CM | POA: Diagnosis not present

## 2015-06-15 DIAGNOSIS — Z79899 Other long term (current) drug therapy: Secondary | ICD-10-CM | POA: Diagnosis not present

## 2015-06-26 DIAGNOSIS — J449 Chronic obstructive pulmonary disease, unspecified: Secondary | ICD-10-CM | POA: Diagnosis not present

## 2015-07-05 DIAGNOSIS — J449 Chronic obstructive pulmonary disease, unspecified: Secondary | ICD-10-CM | POA: Diagnosis not present

## 2015-07-27 DIAGNOSIS — J449 Chronic obstructive pulmonary disease, unspecified: Secondary | ICD-10-CM | POA: Diagnosis not present

## 2015-08-05 DIAGNOSIS — J449 Chronic obstructive pulmonary disease, unspecified: Secondary | ICD-10-CM | POA: Diagnosis not present

## 2015-08-26 DIAGNOSIS — J449 Chronic obstructive pulmonary disease, unspecified: Secondary | ICD-10-CM | POA: Diagnosis not present

## 2015-09-04 DIAGNOSIS — J449 Chronic obstructive pulmonary disease, unspecified: Secondary | ICD-10-CM | POA: Diagnosis not present

## 2015-09-05 ENCOUNTER — Emergency Department (HOSPITAL_COMMUNITY): Payer: Commercial Managed Care - HMO

## 2015-09-05 ENCOUNTER — Inpatient Hospital Stay (HOSPITAL_COMMUNITY)
Admission: EM | Admit: 2015-09-05 | Discharge: 2015-09-08 | DRG: 190 | Disposition: A | Discharge status: Home / Self Care | Payer: Commercial Managed Care - HMO | Attending: Internal Medicine | Admitting: Internal Medicine

## 2015-09-05 ENCOUNTER — Encounter (HOSPITAL_COMMUNITY): Payer: Self-pay | Admitting: Emergency Medicine

## 2015-09-05 DIAGNOSIS — G894 Chronic pain syndrome: Secondary | ICD-10-CM | POA: Diagnosis not present

## 2015-09-05 DIAGNOSIS — F17210 Nicotine dependence, cigarettes, uncomplicated: Secondary | ICD-10-CM | POA: Diagnosis not present

## 2015-09-05 DIAGNOSIS — R0602 Shortness of breath: Secondary | ICD-10-CM | POA: Diagnosis not present

## 2015-09-05 DIAGNOSIS — E1165 Type 2 diabetes mellitus with hyperglycemia: Secondary | ICD-10-CM

## 2015-09-05 DIAGNOSIS — J441 Chronic obstructive pulmonary disease with (acute) exacerbation: Principal | ICD-10-CM | POA: Diagnosis present

## 2015-09-05 DIAGNOSIS — Z79899 Other long term (current) drug therapy: Secondary | ICD-10-CM | POA: Diagnosis not present

## 2015-09-05 DIAGNOSIS — J9611 Chronic respiratory failure with hypoxia: Secondary | ICD-10-CM | POA: Diagnosis not present

## 2015-09-05 DIAGNOSIS — Z7951 Long term (current) use of inhaled steroids: Secondary | ICD-10-CM | POA: Diagnosis not present

## 2015-09-05 DIAGNOSIS — J9621 Acute and chronic respiratory failure with hypoxia: Secondary | ICD-10-CM

## 2015-09-05 DIAGNOSIS — Z90710 Acquired absence of both cervix and uterus: Secondary | ICD-10-CM | POA: Diagnosis not present

## 2015-09-05 DIAGNOSIS — Z7984 Long term (current) use of oral hypoglycemic drugs: Secondary | ICD-10-CM

## 2015-09-05 DIAGNOSIS — R071 Chest pain on breathing: Secondary | ICD-10-CM | POA: Diagnosis not present

## 2015-09-05 DIAGNOSIS — I10 Essential (primary) hypertension: Secondary | ICD-10-CM

## 2015-09-05 DIAGNOSIS — F17200 Nicotine dependence, unspecified, uncomplicated: Secondary | ICD-10-CM | POA: Diagnosis present

## 2015-09-05 DIAGNOSIS — Z7982 Long term (current) use of aspirin: Secondary | ICD-10-CM | POA: Diagnosis not present

## 2015-09-05 DIAGNOSIS — E875 Hyperkalemia: Secondary | ICD-10-CM | POA: Diagnosis not present

## 2015-09-05 DIAGNOSIS — M549 Dorsalgia, unspecified: Secondary | ICD-10-CM | POA: Diagnosis not present

## 2015-09-05 DIAGNOSIS — J9612 Chronic respiratory failure with hypercapnia: Secondary | ICD-10-CM

## 2015-09-05 DIAGNOSIS — J9610 Chronic respiratory failure, unspecified whether with hypoxia or hypercapnia: Secondary | ICD-10-CM

## 2015-09-05 DIAGNOSIS — J069 Acute upper respiratory infection, unspecified: Secondary | ICD-10-CM | POA: Diagnosis not present

## 2015-09-05 DIAGNOSIS — E119 Type 2 diabetes mellitus without complications: Secondary | ICD-10-CM | POA: Diagnosis not present

## 2015-09-05 DIAGNOSIS — Z9981 Dependence on supplemental oxygen: Secondary | ICD-10-CM | POA: Diagnosis not present

## 2015-09-05 DIAGNOSIS — R05 Cough: Secondary | ICD-10-CM | POA: Diagnosis not present

## 2015-09-05 LAB — CBC WITH DIFFERENTIAL/PLATELET
Basophils Absolute: 0 10*3/uL (ref 0.0–0.1)
Basophils Relative: 0 %
Eosinophils Absolute: 0 10*3/uL (ref 0.0–0.7)
Eosinophils Relative: 0 %
HCT: 49.4 % — ABNORMAL HIGH (ref 36.0–46.0)
HEMOGLOBIN: 17 g/dL — AB (ref 12.0–15.0)
LYMPHS ABS: 1.8 10*3/uL (ref 0.7–4.0)
LYMPHS PCT: 31 %
MCH: 29 pg (ref 26.0–34.0)
MCHC: 34.4 g/dL (ref 30.0–36.0)
MCV: 84.2 fL (ref 78.0–100.0)
Monocytes Absolute: 0.5 10*3/uL (ref 0.1–1.0)
Monocytes Relative: 9 %
Neutro Abs: 3.4 10*3/uL (ref 1.7–7.7)
Neutrophils Relative %: 60 %
Platelets: 170 10*3/uL (ref 150–400)
RBC: 5.87 MIL/uL — ABNORMAL HIGH (ref 3.87–5.11)
RDW: 13.8 % (ref 11.5–15.5)
WBC: 5.7 10*3/uL (ref 4.0–10.5)

## 2015-09-05 LAB — COMPREHENSIVE METABOLIC PANEL
ALT: 24 U/L (ref 14–54)
AST: 23 U/L (ref 15–41)
Albumin: 3.7 g/dL (ref 3.5–5.0)
Alkaline Phosphatase: 74 U/L (ref 38–126)
Anion gap: 6 (ref 5–15)
BILIRUBIN TOTAL: 0.6 mg/dL (ref 0.3–1.2)
BUN: 25 mg/dL — AB (ref 6–20)
CO2: 31 mmol/L (ref 22–32)
Calcium: 8.8 mg/dL — ABNORMAL LOW (ref 8.9–10.3)
Chloride: 105 mmol/L (ref 101–111)
Creatinine, Ser: 1.22 mg/dL — ABNORMAL HIGH (ref 0.44–1.00)
GFR calc Af Amer: 52 mL/min — ABNORMAL LOW (ref >60)
GFR calc non Af Amer: 45 mL/min — ABNORMAL LOW (ref >60)
Glucose, Bld: 104 mg/dL — ABNORMAL HIGH (ref 65–99)
POTASSIUM: 4.1 mmol/L (ref 3.5–5.1)
SODIUM: 142 mmol/L (ref 135–145)
TOTAL PROTEIN: 6.9 g/dL (ref 6.5–8.1)

## 2015-09-05 LAB — I-STAT TROPONIN, ED: TROPONIN I, POC: 0.05 ng/mL (ref 0.00–0.08)

## 2015-09-05 LAB — I-STAT CG4 LACTIC ACID, ED: LACTIC ACID, VENOUS: 0.66 mmol/L (ref 0.5–2.0)

## 2015-09-05 MED ORDER — TIOTROPIUM BROMIDE MONOHYDRATE 18 MCG IN CAPS
ORAL_CAPSULE | RESPIRATORY_TRACT | Status: AC
  Filled 2015-09-05: qty 5

## 2015-09-05 MED ORDER — SODIUM CHLORIDE 0.9 % IV SOLN
INTRAVENOUS | Status: DC
  Administered 2015-09-06: 50 mL via INTRAVENOUS
  Administered 2015-09-08: 05:00:00 via INTRAVENOUS

## 2015-09-05 MED ORDER — OXYMORPHONE HCL ER 7.5 MG PO TB12: 7.5000 mg | ORAL_TABLET | Freq: Every day | ORAL | Status: DC

## 2015-09-05 MED ORDER — ASPIRIN EC 81 MG PO TBEC
162.0000 mg | DELAYED_RELEASE_TABLET | Freq: Every morning | ORAL | Status: DC
  Administered 2015-09-06 – 2015-09-08 (×3): 162 mg via ORAL
  Filled 2015-09-05 (×3): qty 2

## 2015-09-05 MED ORDER — HYDROCHLOROTHIAZIDE 12.5 MG PO CAPS
12.5000 mg | ORAL_CAPSULE | Freq: Every day | ORAL | Status: DC
  Administered 2015-09-06 – 2015-09-08 (×3): 12.5 mg via ORAL
  Filled 2015-09-05 (×3): qty 1

## 2015-09-05 MED ORDER — SODIUM CHLORIDE 0.9 % IV BOLUS (SEPSIS)
1000.0000 mL | Freq: Once | INTRAVENOUS | Status: AC
  Administered 2015-09-05: 1000 mL via INTRAVENOUS

## 2015-09-05 MED ORDER — ENOXAPARIN SODIUM 40 MG/0.4ML ~~LOC~~ SOLN
40.0000 mg | SUBCUTANEOUS | Status: DC
  Administered 2015-09-05 – 2015-09-07 (×3): 40 mg via SUBCUTANEOUS
  Filled 2015-09-05 (×3): qty 0.4

## 2015-09-05 MED ORDER — INSULIN ASPART 100 UNIT/ML ~~LOC~~ SOLN
0.0000 [IU] | Freq: Three times a day (TID) | SUBCUTANEOUS | Status: DC
  Administered 2015-09-06 – 2015-09-07 (×3): 3 [IU] via SUBCUTANEOUS
  Administered 2015-09-08 (×2): 2 [IU] via SUBCUTANEOUS

## 2015-09-05 MED ORDER — TIOTROPIUM BROMIDE MONOHYDRATE 18 MCG IN CAPS
18.0000 ug | ORAL_CAPSULE | Freq: Two times a day (BID) | RESPIRATORY_TRACT | Status: DC
  Administered 2015-09-06 – 2015-09-08 (×5): 18 ug via RESPIRATORY_TRACT
  Filled 2015-09-05: qty 5

## 2015-09-05 MED ORDER — ACETAMINOPHEN 650 MG RE SUPP: 650.0000 mg | Freq: Four times a day (QID) | RECTAL | Status: DC | PRN

## 2015-09-05 MED ORDER — AZITHROMYCIN 250 MG PO TABS
500.0000 mg | ORAL_TABLET | Freq: Once | ORAL | Status: AC
  Administered 2015-09-05: 500 mg via ORAL
  Filled 2015-09-05: qty 2

## 2015-09-05 MED ORDER — SODIUM CHLORIDE 0.9 % IJ SOLN
3.0000 mL | Freq: Two times a day (BID) | INTRAMUSCULAR | Status: DC
  Administered 2015-09-05 – 2015-09-07 (×5): 3 mL via INTRAVENOUS

## 2015-09-05 MED ORDER — METHYLPREDNISOLONE SODIUM SUCC 125 MG IJ SOLR: 80.0000 mg | Freq: Three times a day (TID) | INTRAMUSCULAR | Status: DC

## 2015-09-05 MED ORDER — ALBUTEROL SULFATE (2.5 MG/3ML) 0.083% IN NEBU: 2.5000 mg | INHALATION_SOLUTION | RESPIRATORY_TRACT | Status: DC | PRN

## 2015-09-05 MED ORDER — ALBUTEROL (5 MG/ML) CONTINUOUS INHALATION SOLN
2.5000 mg/h | INHALATION_SOLUTION | RESPIRATORY_TRACT | Status: DC
  Administered 2015-09-05: 2.5 mg/h via RESPIRATORY_TRACT

## 2015-09-05 MED ORDER — INSULIN ASPART 100 UNIT/ML ~~LOC~~ SOLN: 0.0000 [IU] | Freq: Every day | SUBCUTANEOUS | Status: DC

## 2015-09-05 MED ORDER — ACETAMINOPHEN 325 MG PO TABS: 650.0000 mg | ORAL_TABLET | Freq: Four times a day (QID) | ORAL | Status: DC | PRN

## 2015-09-05 MED ORDER — LEVOFLOXACIN IN D5W 750 MG/150ML IV SOLN
750.0000 mg | INTRAVENOUS | Status: DC
  Administered 2015-09-06: 750 mg via INTRAVENOUS
  Filled 2015-09-05 (×2): qty 150

## 2015-09-05 MED ORDER — GABAPENTIN 100 MG PO CAPS
100.0000 mg | ORAL_CAPSULE | Freq: Every day | ORAL | Status: DC
  Administered 2015-09-06 – 2015-09-08 (×3): 100 mg via ORAL
  Filled 2015-09-05 (×4): qty 1

## 2015-09-05 MED ORDER — NICOTINE 21 MG/24HR TD PT24
21.0000 mg | MEDICATED_PATCH | Freq: Every day | TRANSDERMAL | Status: DC
  Administered 2015-09-07 – 2015-09-08 (×2): 21 mg via TRANSDERMAL
  Filled 2015-09-05 (×3): qty 1

## 2015-09-05 MED ORDER — IRBESARTAN 150 MG PO TABS
150.0000 mg | ORAL_TABLET | Freq: Every day | ORAL | Status: DC
  Administered 2015-09-06 – 2015-09-08 (×3): 150 mg via ORAL
  Filled 2015-09-05 (×3): qty 1

## 2015-09-05 MED ORDER — IPRATROPIUM-ALBUTEROL 0.5-2.5 (3) MG/3ML IN SOLN: 3.0000 mL | RESPIRATORY_TRACT | Status: DC | PRN

## 2015-09-05 MED ORDER — METHYLPREDNISOLONE SODIUM SUCC 125 MG IJ SOLR
60.0000 mg | Freq: Three times a day (TID) | INTRAMUSCULAR | Status: DC
  Administered 2015-09-05 – 2015-09-08 (×8): 60 mg via INTRAVENOUS
  Filled 2015-09-05 (×8): qty 2

## 2015-09-05 MED ORDER — ALBUTEROL (5 MG/ML) CONTINUOUS INHALATION SOLN
10.0000 mg/h | INHALATION_SOLUTION | RESPIRATORY_TRACT | Status: DC
  Administered 2015-09-05: 10 mg/h via RESPIRATORY_TRACT
  Filled 2015-09-05 (×2): qty 20

## 2015-09-05 MED ORDER — MORPHINE SULFATE ER 15 MG PO TBCR
15.0000 mg | EXTENDED_RELEASE_TABLET | Freq: Every day | ORAL | Status: DC
  Administered 2015-09-06 – 2015-09-08 (×3): 15 mg via ORAL
  Filled 2015-09-05 (×3): qty 1

## 2015-09-05 MED ORDER — PANTOPRAZOLE SODIUM 40 MG PO TBEC
40.0000 mg | DELAYED_RELEASE_TABLET | Freq: Every day | ORAL | Status: DC
  Administered 2015-09-06 – 2015-09-08 (×3): 40 mg via ORAL
  Filled 2015-09-05 (×4): qty 1

## 2015-09-05 MED ORDER — IPRATROPIUM BROMIDE 0.02 % IN SOLN: 0.5000 mg | Freq: Four times a day (QID) | RESPIRATORY_TRACT | Status: DC

## 2015-09-05 MED ORDER — ALBUTEROL SULFATE (2.5 MG/3ML) 0.083% IN NEBU: 2.5000 mg | INHALATION_SOLUTION | Freq: Four times a day (QID) | RESPIRATORY_TRACT | Status: DC

## 2015-09-05 MED ORDER — VALSARTAN-HYDROCHLOROTHIAZIDE 160-12.5 MG PO TABS: 1.0000 | ORAL_TABLET | Freq: Every day | ORAL | Status: DC

## 2015-09-05 NOTE — ED Provider Notes (Addendum)
CSN: [id number]     Arrival date & time 09/05/15  1321 History   First MD Initiated Contact with Patient 09/05/15 1341     Chief Complaint  Patient presents with  . Shortness of Breath     (Consider location/radiation/quality/duration/timing/severity/associated sxs/prior Treatment) HPI 64 year old female who presents with cough and shortness of breath. History of COPD on 2 L home oxygen, hypertension and diabetes. States that 2 days ago developed cough, congestion, and runny nose. Has been coughing up yellowish to greenish sputum. States that she has been having progressive shortness of breath, I not improved by her home breathing treatment. Called EMS today, and in route received a breathing treatment and Solu-Medrol. Denies any fevers, nausea or vomiting, abdominal pain, urinary complaints, lightheadedness or syncope. States that whenever she coughs she has a sharp pain in the center part of her chest, but resolves. No pain with deep inspiration or activity. Past Medical History  Diagnosis Date  . COPD (chronic obstructive pulmonary disease) ([place])   . Chronic back pain    Past Surgical History  Procedure Laterality Date  . Abdominal hysterectomy     History reviewed. No pertinent family history. Social History  Substance Use Topics  . Smoking status: Current Every Day Smoker -- 1.00 packs/day for 16 years    Types: Cigarettes  . Smokeless tobacco: None  . Alcohol Use: No   OB History    No data available     Review of Systems 10/14 systems reviewed and are negative other than those stated in the HPI   Allergies  Review of patient's allergies indicates no known allergies.  Home Medications   Prior to Admission medications   Medication Sig Start Date End Date Taking? Authorizing Provider  aspirin EC 81 MG tablet Take 162 mg by mouth every morning.   Yes Historical Provider, MD  gabapentin (NEURONTIN) 100 MG capsule Take 1 capsule by mouth daily. 11/05/13  Yes Historical  Provider, MD  meloxicam (MOBIC) 15 MG tablet Take 1 tablet by mouth at bedtime. 11/11/13  Yes Historical Provider, MD  metFORMIN (GLUCOPHAGE) 500 MG tablet Take 500 mg by mouth 2 (two) times daily.   Yes Historical Provider, MD  oxymorphone (OPANA ER) 7.5 MG TB12 Take 7.5 mg by mouth daily.   Yes Historical Provider, MD  saxagliptin HCl (ONGLYZA) 5 MG TABS tablet Take 5 mg by mouth daily.   Yes Historical Provider, MD  tiotropium (SPIRIVA) 18 MCG inhalation capsule Place 18 mcg into inhaler and inhale 2 (two) times daily.   Yes Historical Provider, MD  valsartan-hydrochlorothiazide (DIOVAN-HCT) 160-12.5 MG per tablet Take 1 tablet by mouth daily.   Yes Historical Provider, MD  permethrin (ELIMITE) 5 % cream Apply to affected area once Patient not taking: Reported on 09/05/2015 11/12/13   Elisha Headland, NP   BP 130/60 mmHg  Pulse 112  Temp(Src) 98.4 F (36.9 C) (Oral)  Resp 16  Ht 5\' 4"  (1.626 m)  Wt 182 lb (82.555 kg)  BMI 31.22 kg/m2  SpO2 97% Physical Exam Physical Exam  Nursing note and vitals reviewed. Constitutional: Well developed, well nourished, non-toxic, and in no acute distress Head: Normocephalic and atraumatic.  Mouth/Throat: Oropharynx is clear. Mucous membranes dry.  Neck: Normal range of motion. Neck supple.  Cardiovascular: tachycardic rate and regular rhythm.   Pulmonary/Chest: Effort increased with tachypnea. No accessory muscle usage. No conversational dyspnea. rhonchorous breath sounds with diffuse expiratory wheezing. anterior chest wall tenderness over sternum Abdominal: Soft. There is no tenderness. There  is no rebound and no guarding.  Musculoskeletal: Normal range of motion.  Neurological: Alert, no facial droop, fluent speech, moves all extremities symmetrically Skin: Skin is warm and dry.  Psychiatric: Cooperative  ED Course  Procedures (including critical care time) Labs Review Labs Reviewed  CBC WITH DIFFERENTIAL/PLATELET - Abnormal; Notable for the  following:    RBC 5.87 (*)    Hemoglobin 17.0 (*)    HCT 49.4 (*)    All other components within normal limits  COMPREHENSIVE METABOLIC PANEL - Abnormal; Notable for the following:    Glucose, Bld 104 (*)    BUN 25 (*)    Creatinine, Ser 1.22 (*)    Calcium 8.8 (*)    GFR calc non Af Amer 45 (*)    GFR calc Af Amer 52 (*)    All other components within normal limits  BASIC METABOLIC PANEL  CBC  I-STAT TROPOININ, ED  I-STAT CG4 LACTIC ACID, ED  I-STAT CG4 LACTIC ACID, ED    Imaging Review Dg Chest 2 View  09/05/2015  CLINICAL DATA:  Cough and shortness of Breath EXAM: CHEST - 2 VIEW COMPARISON:  \05/04/2015 FINDINGS: Cardiac shadow is stable. Diffuse interstitial changes are identified bilaterally stable from the prior exam and likely chronic in nature. No sizable infiltrate or effusion is seen. Mild hyperinflation is noted. IMPRESSION: Stable interstitial changes when compare with the prior exam. Electronically Signed   By: Inez Catalina M.D.   On: 09/05/2015 16:49   I have personally reviewed and evaluated these images and lab results as part of my medical decision-making.   EKG Interpretation   Date/Time:  Sunday September 05 2015 13:42:32 EST Ventricular Rate:  105 PR Interval:  134 QRS Duration: 98 QT Interval:  363 QTC Calculation: 480 R Axis:   76 Text Interpretation:  Sinus tachycardia Biatrial enlargement Minimal  depression in inferior leads Confirmed by Quamir Willemsen MD, Hinton Dyer [id number]) on  09/05/2015 4:00:58 PM      MDM   Final diagnoses:  COPD exacerbation ([place])  Upper respiratory infection    64 year old female with history of COPD on 2 L home oxygen who presents with dyspnea in the setting of cough, sputum production, and congestion. On presentation, she is in no acute distress, has oxygenation in the low 90 percentile on 2 L. Eyes able to speak in full sentences, but with diffuse inspiratory and expiratory wheezing on exam and rhonchorous breath sounds throughout.  She has previously received Solu-Medrol by EMS prior to arrival. Also received 2 breathing treatments prior to arrival. Is placed on continuous albuterol for 1 hour, and on my reevaluation has improved air movement but continued rhonchorous breath sounds throughout. X-ray without acute infiltrate. Is given 1 g of azithromycin for treatment of COPD exacerbation.  She has been persistently tachycardic here in the emergency department even prior to receiving her continuous albuterol. Has received IV fluids with some mild improvement. EKG was sinus tachycardia and minimal depressions in the inferior leads, which is more rate related rather than due to underlying ACS. Her troponin is negative, and she is chest pain-free.  Patient not significantly improved with treatments here in the emergency department, but is in no respiratory distress to require BiPAP. Mentating well, and low suspicion for significant CO2 retention at this time. As discussed with [facility] and admitted to telemetry for ongoing treatment for COPD.    Forde Dandy, MD 09/05/15 2229  Forde Dandy, MD 09/14/15 [phone]

## 2015-09-05 NOTE — Progress Notes (Signed)
ANTIBIOTIC CONSULT NOTE - INITIAL  Pharmacy Consult for levacquin Indication:  COPD exacerbation  No Known Allergies  Patient Measurements: Height: 5\' 4"  (162.6 cm) Weight: 182 lb (82.555 kg) IBW/kg (Calculated) : 54.7   Vital Signs: Temp: 97.5 F (36.4 C) (12/11 1331) Temp Source: Oral (12/11 1331) BP: 146/61 mmHg (12/11 1730) Pulse Rate: 120 (12/11 1730) Intake/Output from previous day:   Intake/Output from this shift:    Labs:  Recent Labs  09/05/15 1418  WBC 5.7  HGB 17.0*  PLT 170  CREATININE 1.22*   Estimated Creatinine Clearance: 47.2 mL/min (by C-G formula based on Cr of 1.22). No results for input(s): VANCOTROUGH, VANCOPEAK, VANCORANDOM, GENTTROUGH, GENTPEAK, GENTRANDOM, TOBRATROUGH, TOBRAPEAK, TOBRARND, AMIKACINPEAK, AMIKACINTROU, AMIKACIN in the last 72 hours.   Microbiology: No results found for this or any previous visit (from the past 720 hour(s)).  Medical History: Past Medical History  Diagnosis Date  . COPD (chronic obstructive pulmonary disease) ([place])   . Chronic back pain     Medications:  See medication history Assessment: 64 yo lady to start levaquin for COPD exacerbation.  She received azithromycin earlier today so will delay start of levaquin until tomorrow morning.  Goal of Therapy:  Eradication of infection  Plan:  Levaquin 750 mg IV q48 hours F/u renal function, cultures and clinical course  Thanks for allowing pharmacy to be a part of this patient's care.  Excell Seltzer, PharmD Clinical Pharmacist 09/05/2015,6:14 PM

## 2015-09-05 NOTE — H&P (Signed)
Patient Demographics  Erica [name], is a 64 y.o. female  MRN: [id number]   DOB - 1951/07/02  Admit Date - 09/05/2015  Outpatient Primary MD for the patient is Maggie Font, MD   With History of -  Past Medical History  Diagnosis Date  . COPD (chronic obstructive pulmonary disease) ([place])   . Chronic back pain       Past Surgical History  Procedure Laterality Date  . Abdominal hysterectomy      in for   Chief Complaint  Patient presents with  . Shortness of Breath     HPI  Erica [name]  is a 64 y.o. female, with history of COPD on 2 L nasal cannula at nighttime and as needed daytime, hypertension, diabetes mellitus, chronic back pain, since with complaints of worsening dyspnea over last few days, ports been using nebs multiple times without help, as well had increased oxygen demand, where she needs to use it more often, reports cough, with a productive yellow sputum, denies fever, chills, hemoptysis, orthopnea, lower extremity edema, chest x-ray with no evidence of pneumonia, patient was given nebs in ED, and azithromycin , and tends to complain of dyspnea , but this requested to admit for COPD exacerbation , workup was significant for a glycohemoglobin of 17 ,     Review of Systems    In addition to the HPI above,  No Fever-chills, No Headache, No changes with Vision or hearing, No problems swallowing food or Liquids, No Chest pain,  complaints of Cough And  Shortness of Breath, No Abdominal pain, No Nausea or Vommitting, Bowel movements are regular, No Blood in stool or Urine, No dysuria, No new skin rashes or bruises, No new joints pains-aches,  No new weakness, tingling, numbness in any extremity, No recent weight gain or loss, No polyuria, polydypsia or polyphagia, No significant Mental Stressors.  A full 10 point Review of Systems was done, except as stated above, all other Review of Systems were negative.   Social History Social History    Substance Use Topics  . Smoking status: Current Every Day Smoker  . Smokeless tobacco: Not on file  . Alcohol Use: No     Family History  history reviewed, reports history of coronary artery disease in father, but not at young age.  Prior to Admission medications   Medication Sig Start Date End Date Taking? Authorizing Provider  aspirin EC 81 MG tablet Take 162 mg by mouth every morning.   Yes Historical Provider, MD  gabapentin (NEURONTIN) 100 MG capsule Take 1 capsule by mouth daily. 11/05/13  Yes Historical Provider, MD  meloxicam (MOBIC) 15 MG tablet Take 1 tablet by mouth at bedtime. 11/11/13  Yes Historical Provider, MD  metFORMIN (GLUCOPHAGE) 500 MG tablet Take 500 mg by mouth 2 (two) times daily.   Yes Historical Provider, MD  oxymorphone (OPANA ER) 7.5 MG TB12 Take 7.5 mg by mouth daily.   Yes Historical Provider, MD  saxagliptin HCl (ONGLYZA) 5 MG TABS tablet Take 5 mg by mouth daily.   Yes Historical Provider, MD  tiotropium (SPIRIVA) 18 MCG inhalation capsule Place 18 mcg into inhaler and inhale 2 (two) times daily.   Yes Historical Provider, MD  valsartan-hydrochlorothiazide (DIOVAN-HCT) 160-12.5 MG per tablet Take 1 tablet by mouth daily.   Yes Historical Provider, MD  permethrin (ELIMITE) 5 % cream Apply to affected area once Patient not taking: Reported on 09/05/2015 11/12/13   Elisha Headland, NP    No Known  Allergies  Physical Exam  Vitals  Blood pressure 133/56, pulse 118, temperature 97.5 F (36.4 C), temperature source Oral, resp. rate 25, height 5\' 4"  (1.626 m), weight 82.555 kg (182 lb), SpO2 92 %.   1. General elderly female lying in bed in NAD,    2. Normal affect and insight, Not Suicidal or Homicidal, Awake Alert, Oriented X 3.  3. No F.N deficits, ALL C.Nerves Intact, Strength 5/5 all 4 extremities, Sensation intact all 4 extremities, Plantars down going.  4. Ears and Eyes appear Normal, Conjunctivae clear, PERRLA. Moist Oral Mucosa.  5. Supple Neck,  No JVD, No cervical lymphadenopathy appriciated, No Carotid Bruits.  6. Symmetrical Chest wall movement,  diminished breath sounds bilaterally, and respiratory wheezing  7. Tachycardic, No Gallops, Rubs or Murmurs, No Parasternal Heave.  8. Positive Bowel Sounds, Abdomen Soft, No tenderness, No organomegaly appriciated,No rebound -guarding or rigidity.  9.  No Cyanosis, Normal Skin Turgor, No Skin Rash or Bruise.  10. Good muscle tone,  joints appear normal , no effusions, Normal ROM.  11. No Palpable Lymph Nodes in Neck or Axillae   Data Review  CBC  Recent Labs Lab 09/05/15 1418  WBC 5.7  HGB 17.0*  HCT 49.4*  PLT 170  MCV 84.2  MCH 29.0  MCHC 34.4  RDW 13.8  LYMPHSABS 1.8  MONOABS 0.5  EOSABS 0.0  BASOSABS 0.0   ------------------------------------------------------------------------------------------------------------------  Chemistries   Recent Labs Lab 09/05/15 1418  NA 142  K 4.1  CL 105  CO2 31  GLUCOSE 104*  BUN 25*  CREATININE 1.22*  CALCIUM 8.8*  AST 23  ALT 24  ALKPHOS 74  BILITOT 0.6   ------------------------------------------------------------------------------------------------------------------ estimated creatinine clearance is 47.2 mL/min (by C-G formula based on Cr of 1.22). ------------------------------------------------------------------------------------------------------------------ No results for input(s): TSH, T4TOTAL, T3FREE, THYROIDAB in the last 72 hours.  Invalid input(s): FREET3   Coagulation profile No results for input(s): INR, PROTIME in the last 168 hours. ------------------------------------------------------------------------------------------------------------------- No results for input(s): DDIMER in the last 72 hours. -------------------------------------------------------------------------------------------------------------------  Cardiac Enzymes No results for input(s): CKMB, TROPONINI, MYOGLOBIN in the  last 168 hours.  Invalid input(s): CK ------------------------------------------------------------------------------------------------------------------ Invalid input(s): POCBNP   ---------------------------------------------------------------------------------------------------------------  Urinalysis    Component Value Date/Time   COLORURINE YELLOW 03/14/2013 1847   APPEARANCEUR CLEAR 03/14/2013 1847   LABSPEC 1.025 03/14/2013 1847   PHURINE 6.0 03/14/2013 1847   GLUCOSEU NEGATIVE 03/14/2013 1847   HGBUR NEGATIVE 03/14/2013 1847   BILIRUBINUR NEGATIVE 03/14/2013 1847   KETONESUR NEGATIVE 03/14/2013 1847   PROTEINUR NEGATIVE 03/14/2013 1847   UROBILINOGEN 0.2 03/14/2013 1847   NITRITE NEGATIVE 03/14/2013 1847   LEUKOCYTESUR NEGATIVE 03/14/2013 1847    ----------------------------------------------------------------------------------------------------------------  Imaging results:   Dg Chest 2 View  09/05/2015  CLINICAL DATA:  Cough and shortness of Breath EXAM: CHEST - 2 VIEW COMPARISON:  \05/04/2015 FINDINGS: Cardiac shadow is stable. Diffuse interstitial changes are identified bilaterally stable from the prior exam and likely chronic in nature. No sizable infiltrate or effusion is seen. Mild hyperinflation is noted. IMPRESSION: Stable interstitial changes when compare with the prior exam. Electronically Signed   By: Inez Catalina M.D.   On: 09/05/2015 16:49       Assessment & Plan  Active Problems:   COPD exacerbation ([facility])   Chronic pain syndrome   Hypertension   Diabetes mellitus type 2, controlled ([facility])   Chronic respiratory failure ([facility])   COPD exacerbation  - Impression presents with cough, productive sputum, significant wheezing, known history of COPD ,  will be admitted to telemetry, will be started on IV Solu-Medrol, DuoNeb's, when necessary albuterol, during her productive cough with yellow sputum will be started on IV levofloxacin , continue with Spiriva  .  Acute on chronic respiratory failure - she is on 2 L nasal cannula at bedtime and as needed daytime, 2003 L nasal cannula at trace saturating 92%, - Does not appear to be in respiratory distress to require BiPAP  Diabetes mellitus - Hold oral hypoglycemic agent, and start insulin sliding scale  Hypertension - Continue with home medication  Chronic back pain - Continue with home medication  DVT Prophylaxis   Lovenox -  AM Labs Ordered, also please review Full Orders  Family Communication: Admission, patients condition and plan of care including tests being ordered have been discussed with the patient who indicate understanding and agree with the plan and Code Status.  Code Status Full  Likely DC to  Home when stable  Condition GUARDED    Time spent in minutes : 50 minutes    Delania Ferg M.D on 09/05/2015 at 5:56 PM  Between 7am to 7pm - Pager - [phone]  After 7pm go to [email or web address] - password [email or web address]  And look for the night coverage person covering me after hours  [facility] Office  [phone]

## 2015-09-05 NOTE — ED Notes (Signed)
Per EMS patient walked to EMS station from home. Complains of shortness of breath with left flank pain that radiates to left back, history of COPD.  Given by EMS:  Solumedrol 125 Albuterol nebulizer Deuoned

## 2015-09-06 ENCOUNTER — Encounter (HOSPITAL_COMMUNITY): Payer: Self-pay | Admitting: *Deleted

## 2015-09-06 DIAGNOSIS — J9611 Chronic respiratory failure with hypoxia: Secondary | ICD-10-CM

## 2015-09-06 DIAGNOSIS — E1165 Type 2 diabetes mellitus with hyperglycemia: Secondary | ICD-10-CM

## 2015-09-06 LAB — BASIC METABOLIC PANEL
ANION GAP: 6 (ref 5–15)
BUN: 22 mg/dL — ABNORMAL HIGH (ref 6–20)
CHLORIDE: 103 mmol/L (ref 101–111)
CO2: 32 mmol/L (ref 22–32)
CREATININE: 1.12 mg/dL — AB (ref 0.44–1.00)
Calcium: 8.6 mg/dL — ABNORMAL LOW (ref 8.9–10.3)
GFR calc non Af Amer: 50 mL/min — ABNORMAL LOW (ref >60)
GFR, EST AFRICAN AMERICAN: 58 mL/min — AB (ref >60)
Glucose, Bld: 154 mg/dL — ABNORMAL HIGH (ref 65–99)
POTASSIUM: 5.6 mmol/L — AB (ref 3.5–5.1)
SODIUM: 141 mmol/L (ref 135–145)

## 2015-09-06 LAB — GLUCOSE, CAPILLARY
GLUCOSE-CAPILLARY: 176 mg/dL — AB (ref 65–99)
GLUCOSE-CAPILLARY: 109 mg/dL — AB (ref 65–99)
Glucose-Capillary: 112 mg/dL — ABNORMAL HIGH (ref 65–99)
Glucose-Capillary: 155 mg/dL — ABNORMAL HIGH (ref 65–99)

## 2015-09-06 LAB — CBC
HEMATOCRIT: 48 % — AB (ref 36.0–46.0)
HEMOGLOBIN: 15.9 g/dL — AB (ref 12.0–15.0)
MCH: 28.5 pg (ref 26.0–34.0)
MCHC: 33.1 g/dL (ref 30.0–36.0)
MCV: 86 fL (ref 78.0–100.0)
Platelets: 176 10*3/uL (ref 150–400)
RBC: 5.58 MIL/uL — ABNORMAL HIGH (ref 3.87–5.11)
RDW: 14 % (ref 11.5–15.5)
WBC: 6.8 10*3/uL (ref 4.0–10.5)

## 2015-09-06 MED ORDER — SODIUM POLYSTYRENE SULFONATE 15 GM/60ML PO SUSP
15.0000 g | Freq: Once | ORAL | Status: AC
  Administered 2015-09-06: 15 g via ORAL
  Filled 2015-09-06: qty 60

## 2015-09-06 MED ORDER — HYDROCOD POLST-CPM POLST ER 10-8 MG/5ML PO SUER
5.0000 mL | Freq: Two times a day (BID) | ORAL | Status: DC | PRN
  Administered 2015-09-06: 5 mL via ORAL
  Filled 2015-09-06: qty 5

## 2015-09-06 MED ORDER — ALBUTEROL SULFATE (2.5 MG/3ML) 0.083% IN NEBU
2.5000 mg | INHALATION_SOLUTION | Freq: Four times a day (QID) | RESPIRATORY_TRACT | Status: DC
  Administered 2015-09-06 – 2015-09-08 (×7): 2.5 mg via RESPIRATORY_TRACT
  Filled 2015-09-06 (×8): qty 3

## 2015-09-06 NOTE — Progress Notes (Signed)
[facility] PROGRESS NOTE  Erica [name] [id number] DOB: December 10, 1950 DOA: 09/05/2015 PCP: Maggie Font, MD  Assessment/Plan: Active Problems:   COPD exacerbation ([place]) - Continue current regimen. Currently improving on this regimen. - With continued improvement will plan on decreasing Solumedrol dose. - Pt on levaquin  Hyperkalemia - Will administer kayexalate - albuterol on board    Chronic pain syndrome - continue supportive therapy    Hypertension - Pt is currently on Avapro and hctz    Diabetes mellitus type 2, controlled ([place]) - Currently on SSI,  - continue diabetic diet    Chronic respiratory failure ([place]) - continue supplemental oxygen as needed  Code Status: full Family Communication:  None at bedside Disposition Plan:    Consultants:  None  Procedures:  None  Antibiotics:  Levaquin  HPI/Subjective: Pt has no new complaints. No acute issues reported overnight.  Objective: Filed Vitals:   09/06/15 0521 09/06/15 0738  BP: 147/79   Pulse: 95 97  Temp: 98.5 F (36.9 C)   Resp: 18     Intake/Output Summary (Last 24 hours) at 09/06/15 1414 Last data filed at 09/06/15 0800  Gross per 24 hour  Intake    240 ml  Output      0 ml  Net    240 ml   Filed Weights   09/05/15 1331 09/06/15 0521  Weight: 82.555 kg (182 lb) 72.439 kg (159 lb 11.2 oz)    Exam:   General:  Pt in nad, alert and awake  Cardiovascular: rrr, no mrg  Respiratory: prolonged exp phase, no wheezes, [place] in place, equal chest rise.  Abdomen: soft, ND, NT  Musculoskeletal: no cyanosis or clubbing   Data Reviewed: Basic Metabolic Panel:  Recent Labs Lab 09/05/15 1418 09/06/15 0703  NA 142 141  K 4.1 5.6*  CL 105 103  CO2 31 32  GLUCOSE 104* 154*  BUN 25* 22*  CREATININE 1.22* 1.12*  CALCIUM 8.8* 8.6*   Liver Function Tests:  Recent Labs Lab 09/05/15 1418  AST 23  ALT 24  ALKPHOS 74  BILITOT 0.6  PROT 6.9  ALBUMIN 3.7   No results  for input(s): LIPASE, AMYLASE in the last 168 hours. No results for input(s): AMMONIA in the last 168 hours. CBC:  Recent Labs Lab 09/05/15 1418 09/06/15 0703  WBC 5.7 6.8  NEUTROABS 3.4  --   HGB 17.0* 15.9*  HCT 49.4* 48.0*  MCV 84.2 86.0  PLT 170 176   Cardiac Enzymes: No results for input(s): CKTOTAL, CKMB, CKMBINDEX, TROPONINI in the last 168 hours. BNP (last 3 results) No results for input(s): BNP in the last 8760 hours.  ProBNP (last 3 results) No results for input(s): PROBNP in the last 8760 hours.  CBG:  Recent Labs Lab 09/06/15 0823 09/06/15 1126  GLUCAP 176* 109*    No results found for this or any previous visit (from the past 240 hour(s)).   Studies: Dg Chest 2 View  09/05/2015  CLINICAL DATA:  Cough and shortness of Breath EXAM: CHEST - 2 VIEW COMPARISON:  \05/04/2015 FINDINGS: Cardiac shadow is stable. Diffuse interstitial changes are identified bilaterally stable from the prior exam and likely chronic in nature. No sizable infiltrate or effusion is seen. Mild hyperinflation is noted. IMPRESSION: Stable interstitial changes when compare with the prior exam. Electronically Signed   By: Inez Catalina M.D.   On: 09/05/2015 16:49    Scheduled Meds: . albuterol  2.5 mg Nebulization Q6H WA  . aspirin EC  162 mg Oral q morning - 10a  . enoxaparin (LOVENOX) injection  40 mg Subcutaneous Q24H  . gabapentin  100 mg Oral Daily  . irbesartan  150 mg Oral Daily   And  . hydrochlorothiazide  12.5 mg Oral Daily  . insulin aspart  0-15 Units Subcutaneous TID WC  . insulin aspart  0-5 Units Subcutaneous QHS  . levofloxacin (LEVAQUIN) IV  750 mg Intravenous Q48H  . methylPREDNISolone (SOLU-MEDROL) injection  60 mg Intravenous 3 times per day  . morphine  15 mg Oral Daily  . nicotine  21 mg Transdermal Daily  . pantoprazole  40 mg Oral Daily  . sodium chloride  3 mL Intravenous Q12H  . tiotropium  18 mcg Inhalation BID   Continuous Infusions: . sodium chloride 50  mL (09/06/15 0103)    Time spent: > 35 minutes   Velvet Bathe  [facility] Pager [phone] If 7PM-7AM, please contact night-coverage at [email or web address], password [email or web address] 09/06/2015, 2:14 PM  LOS: 1 day

## 2015-09-06 NOTE — Care Management Note (Signed)
Case Management Note  Patient Details  Name: Erica [name] MRN: [id number] Date of Birth: 01/18/1951  Subjective/Objective:                  Pt is from home, lives with her son and is ind with ADL's. Pt admitted for COPD exacerbation. Pt has home O2 through [place]. Pt has neb machine. Pt uses cane/walker as needed. Prior to admission pt has no DME needs or [place] services.   Action/Plan: Pt plans to return home, lives with her son. No CM needs anticipated.   Expected Discharge Date:     09/09/2015             Expected Discharge Plan:  Home/Self Care  In-House Referral:  NA  Discharge planning Services  CM Consult  Post Acute Care Choice:  NA Choice offered to:  NA  DME Arranged:    DME Agency:     HH Arranged:    HH Agency:     Status of Service:  Completed, signed off  Medicare Important Message Given:    Date Medicare IM Given:    Medicare IM give by:    Date Additional Medicare IM Given:    Additional Medicare Important Message give by:     If discussed at [place] of Stay Meetings, dates discussed:    Additional Comments:  Sherald Barge, RN 09/06/2015, 3:16 PM

## 2015-09-06 NOTE — Progress Notes (Signed)
Notified the MD of the patients request for cough syrup.  I requested tussinex.  New orders given and followed.

## 2015-09-07 LAB — BASIC METABOLIC PANEL
ANION GAP: 4 — AB (ref 5–15)
BUN: 29 mg/dL — ABNORMAL HIGH (ref 6–20)
CO2: 34 mmol/L — ABNORMAL HIGH (ref 22–32)
Calcium: 8.5 mg/dL — ABNORMAL LOW (ref 8.9–10.3)
Chloride: 101 mmol/L (ref 101–111)
Creatinine, Ser: 1.01 mg/dL — ABNORMAL HIGH (ref 0.44–1.00)
GFR, EST NON AFRICAN AMERICAN: 57 mL/min — AB (ref >60)
Glucose, Bld: 148 mg/dL — ABNORMAL HIGH (ref 65–99)
POTASSIUM: 4.8 mmol/L (ref 3.5–5.1)
SODIUM: 139 mmol/L (ref 135–145)

## 2015-09-07 LAB — GLUCOSE, CAPILLARY
GLUCOSE-CAPILLARY: 158 mg/dL — AB (ref 65–99)
GLUCOSE-CAPILLARY: 116 mg/dL — AB (ref 65–99)
Glucose-Capillary: 197 mg/dL — ABNORMAL HIGH (ref 65–99)
Glucose-Capillary: 107 mg/dL — ABNORMAL HIGH (ref 65–99)

## 2015-09-07 MED ORDER — LEVOFLOXACIN IN D5W 750 MG/150ML IV SOLN
750.0000 mg | INTRAVENOUS | Status: DC
  Administered 2015-09-07: 750 mg via INTRAVENOUS

## 2015-09-07 MED ORDER — BISACODYL 5 MG PO TBEC
10.0000 mg | DELAYED_RELEASE_TABLET | Freq: Once | ORAL | Status: AC
  Administered 2015-09-07: 10 mg via ORAL
  Filled 2015-09-07: qty 2

## 2015-09-07 NOTE — Progress Notes (Signed)
[facility] PROGRESS NOTE  Erica [name] [id number] DOB: 1951-04-04 DOA: 09/05/2015 PCP: Maggie Font, MD  Brief Narrative: 64 year old presented with acute on chronic respiratory failure. She has history of COPD and states he only uses oxygen at night. Presented hypoxic and requiring supplemental oxygen. Diagnosed with COPD exacerbation.  Assessment/Plan: Active Problems:   COPD exacerbation (HCC) - Continue current regimen. Currently improving on this regimen. - With continued improvement will plan on decreasing Solumedrol dose. - Pt on levaquin - Wean to room air as tolerated  Hyperkalemia -Resolved after administration of kayexalate    Chronic pain syndrome - continue supportive therapy    Hypertension - Pt is currently on Avapro and hctz    Diabetes mellitus type 2, controlled ([place]) - Currently on SSI,  - continue diabetic diet    Chronic respiratory failure ([place]) - continue supplemental oxygen as needed  Code Status: full Family Communication:  None at bedside Disposition Plan: Patient is a discharge is shortness of breath above baseline requiring supplemental oxygenation.   Consultants:  None  Procedures:  None  Antibiotics:  Levaquin  HPI/Subjective: Pt has no new complaints. No acute issues reported overnight.  Objective: Filed Vitals:   09/06/15 2033 09/07/15 0552  BP: 142/70 133/77  Pulse: 88 104  Temp: 98.4 F (36.9 C) 97.7 F (36.5 C)  Resp: 18 18    Intake/Output Summary (Last 24 hours) at 09/07/15 1123 Last data filed at 09/07/15 0846  Gross per 24 hour  Intake    720 ml  Output    400 ml  Net    320 ml   Filed Weights   09/05/15 1331 09/06/15 0521  Weight: 82.555 kg (182 lb) 72.439 kg (159 lb 11.2 oz)    Exam:   General:  Pt in nad, alert and awake  Cardiovascular: rrr, no mrg  Respiratory: prolonged exp phase, no wheezes, [place] in place, equal chest rise.  Abdomen: soft, ND, NT  Musculoskeletal: no  cyanosis or clubbing   Data Reviewed: Basic Metabolic Panel:  Recent Labs Lab 09/05/15 1418 09/06/15 0703 09/07/15 0557  NA 142 141 139  K 4.1 5.6* 4.8  CL 105 103 101  CO2 31 32 34*  GLUCOSE 104* 154* 148*  BUN 25* 22* 29*  CREATININE 1.22* 1.12* 1.01*  CALCIUM 8.8* 8.6* 8.5*   Liver Function Tests:  Recent Labs Lab 09/05/15 1418  AST 23  ALT 24  ALKPHOS 74  BILITOT 0.6  PROT 6.9  ALBUMIN 3.7   No results for input(s): LIPASE, AMYLASE in the last 168 hours. No results for input(s): AMMONIA in the last 168 hours. CBC:  Recent Labs Lab 09/05/15 1418 09/06/15 0703  WBC 5.7 6.8  NEUTROABS 3.4  --   HGB 17.0* 15.9*  HCT 49.4* 48.0*  MCV 84.2 86.0  PLT 170 176   Cardiac Enzymes: No results for input(s): CKTOTAL, CKMB, CKMBINDEX, TROPONINI in the last 168 hours. BNP (last 3 results) No results for input(s): BNP in the last 8760 hours.  ProBNP (last 3 results) No results for input(s): PROBNP in the last 8760 hours.  CBG:  Recent Labs Lab 09/06/15 0823 09/06/15 1126 09/06/15 1629 09/06/15 2037 09/07/15 0737  GLUCAP 176* 109* 112* 155* 158*    No results found for this or any previous visit (from the past 240 hour(s)).   Studies: Dg Chest 2 View  09/05/2015  CLINICAL DATA:  Cough and shortness of Breath EXAM: CHEST - 2 VIEW COMPARISON:  \05/04/2015 FINDINGS: Cardiac shadow  is stable. Diffuse interstitial changes are identified bilaterally stable from the prior exam and likely chronic in nature. No sizable infiltrate or effusion is seen. Mild hyperinflation is noted. IMPRESSION: Stable interstitial changes when compare with the prior exam. Electronically Signed   By: Inez Catalina M.D.   On: 09/05/2015 16:49    Scheduled Meds: . albuterol  2.5 mg Nebulization Q6H WA  . aspirin EC  162 mg Oral q morning - 10a  . enoxaparin (LOVENOX) injection  40 mg Subcutaneous Q24H  . gabapentin  100 mg Oral Daily  . irbesartan  150 mg Oral Daily   And  .  hydrochlorothiazide  12.5 mg Oral Daily  . insulin aspart  0-15 Units Subcutaneous TID WC  . insulin aspart  0-5 Units Subcutaneous QHS  . levofloxacin (LEVAQUIN) IV  750 mg Intravenous Q48H  . methylPREDNISolone (SOLU-MEDROL) injection  60 mg Intravenous 3 times per day  . morphine  15 mg Oral Daily  . nicotine  21 mg Transdermal Daily  . pantoprazole  40 mg Oral Daily  . sodium chloride  3 mL Intravenous Q12H  . tiotropium  18 mcg Inhalation BID   Continuous Infusions: . sodium chloride 50 mL (09/06/15 0103)    Time spent: > 35 minutes   Velvet Bathe  [facility] Pager [phone] If 7PM-7AM, please contact night-coverage at [email or web address], password [facility] 09/07/2015, 11:23 AM  LOS: 2 days

## 2015-09-07 NOTE — Consult Note (Signed)
   [facility] CM Inpatient Consult   09/07/2015  Erica [name] Jul 26, 1951 [id number]  Met with the patient at bedside to offer [place] Management services as benefit of [facility] Gold/Silverback insurance. Patient agreeable to services and will receive post hospital discharge call and will be evaluated for monthly home visits.  Of note, [facility] Care Management services does not replace or interfere with any services that are arranged by inpatient case management or social work. For additional questions or referrals please contact: Royetta Crochet. Laymond Purser, RN, BSN, [facility] Liaison [phone]

## 2015-09-07 NOTE — Progress Notes (Signed)
ANTIBIOTIC CONSULT NOTE   Pharmacy Consult for levaquin Indication:  COPD exacerbation  No Known Allergies  Patient Measurements: Height: 5\' 4"  (162.6 cm) Weight: 159 lb 11.2 oz (72.439 kg) IBW/kg (Calculated) : 54.7  Vital Signs: Temp: 97.7 F (36.5 C) (12/13 0552) Temp Source: Oral (12/13 0552) BP: 133/77 mmHg (12/13 0552) Pulse Rate: 104 (12/13 0552) Intake/Output from previous day: 12/12 0701 - 12/13 0700 In: 960 [P.O.:960] Out: -  Intake/Output from this shift: Total I/O In: -  Out: 400 [Urine:400]  Labs:  Recent Labs  09/05/15 1418 09/06/15 0703 09/07/15 0557  WBC 5.7 6.8  --   HGB 17.0* 15.9*  --   PLT 170 176  --   CREATININE 1.22* 1.12* 1.01*   Estimated Creatinine Clearance: 53.5 mL/min (by C-G formula based on Cr of 1.01). No results for input(s): VANCOTROUGH, VANCOPEAK, VANCORANDOM, GENTTROUGH, GENTPEAK, GENTRANDOM, TOBRATROUGH, TOBRAPEAK, TOBRARND, AMIKACINPEAK, AMIKACINTROU, AMIKACIN in the last 72 hours.   Microbiology: No results found for this or any previous visit (from the past 720 hour(s)).  Medical History: Past Medical History  Diagnosis Date  . COPD (chronic obstructive pulmonary disease) ([place])   . Chronic back pain    Anti-infectives    Start     Dose/Rate Route Frequency Ordered Stop   09/07/15 1300  levofloxacin (LEVAQUIN) IVPB 750 mg     750 mg 100 mL/hr over 90 Minutes Intravenous Every 24 hours 09/07/15 1156     09/06/15 1000  levofloxacin (LEVAQUIN) IVPB 750 mg  Status:  Discontinued     750 mg 100 mL/hr over 90 Minutes Intravenous Every 48 hours 09/05/15 1814 09/07/15 1156   09/05/15 1415  azithromycin (ZITHROMAX) tablet 500 mg     500 mg Oral  Once 09/05/15 1404 09/05/15 1418     Assessment: 64 yo lady to start levaquin for COPD exacerbation.  SCr has improved since admission. Estimated Creatinine Clearance: 53.5 mL/min (by C-G formula based on Cr of 1.01).  Goal of Therapy:  Eradication of infection  Plan:   Levaquin 750 mg IV q24hrs F/u renal function, cultures and clinical course  Thanks for allowing pharmacy to be a part of this patient's care.  Hart Robinsons, PharmD Clinical Pharmacist 09/07/2015,11:57 AM

## 2015-09-08 DIAGNOSIS — J441 Chronic obstructive pulmonary disease with (acute) exacerbation: Principal | ICD-10-CM

## 2015-09-08 LAB — GLUCOSE, CAPILLARY
GLUCOSE-CAPILLARY: 126 mg/dL — AB (ref 65–99)
Glucose-Capillary: 129 mg/dL — ABNORMAL HIGH (ref 65–99)

## 2015-09-08 MED ORDER — ALBUTEROL SULFATE (2.5 MG/3ML) 0.083% IN NEBU: 2.5000 mg | INHALATION_SOLUTION | Freq: Four times a day (QID) | RESPIRATORY_TRACT | Status: DC

## 2015-09-08 MED ORDER — LEVOFLOXACIN 500 MG PO TABS: 500.0000 mg | ORAL_TABLET | Freq: Every day | ORAL | Status: DC

## 2015-09-08 MED ORDER — PREDNISONE 20 MG PO TABS: 60.0000 mg | ORAL_TABLET | Freq: Two times a day (BID) | ORAL | Status: DC

## 2015-09-08 MED ORDER — PREDNISONE 20 MG PO TABS: ORAL_TABLET | ORAL | Status: DC

## 2015-09-08 MED ORDER — LEVOFLOXACIN 500 MG PO TABS
500.0000 mg | ORAL_TABLET | Freq: Every day | ORAL | Status: DC
  Administered 2015-09-08: 500 mg via ORAL
  Filled 2015-09-08: qty 1

## 2015-09-08 NOTE — Care Management Important Message (Signed)
Important Message  Patient Details  Name: Erica [name] MRN: [id number] Date of Birth: [birth year]   Medicare Important Message Given:  Yes    Joylene Draft, RN 09/08/2015, 11:20 AM

## 2015-09-08 NOTE — Progress Notes (Signed)
Oxygen delivered by [facility] for discharge home.  Central telemetry notified of discharge, telemetry removed.  Discharge instructions reviewed with patient, questions answered.  Oxygen connected to portable tank on 3L.

## 2015-09-08 NOTE — Discharge Instructions (Signed)
Chronic Obstructive Pulmonary Disease Chronic obstructive pulmonary disease (COPD) is a common lung condition in which airflow from the lungs is limited. COPD is a general term that can be used to describe many different lung problems that limit airflow, including both chronic bronchitis and emphysema. If you have COPD, your lung function will probably never return to normal, but there are measures you can take to improve lung function and make yourself feel better. CAUSES   Smoking (common).  Exposure to secondhand smoke.  Genetic problems.  Chronic inflammatory lung diseases or recurrent infections. SYMPTOMS  Shortness of breath, especially with physical activity.  Deep, persistent (chronic) cough with a large amount of thick mucus.  Wheezing.  Rapid breaths (tachypnea).  Gray or bluish discoloration (cyanosis) of the skin, especially in your fingers, toes, or lips.  Fatigue.  Weight loss.  Frequent infections or episodes when breathing symptoms become much worse (exacerbations).  Chest tightness. DIAGNOSIS Your health care provider will take a medical history and perform a physical examination to diagnose COPD. Additional tests for COPD may include:  Lung (pulmonary) function tests.  Chest X-ray.  CT scan.  Blood tests. TREATMENT  Treatment for COPD may include:  Inhaler and nebulizer medicines. These help manage the symptoms of COPD and make your breathing more comfortable.  Supplemental oxygen. Supplemental oxygen is only helpful if you have a low oxygen level in your blood.  Exercise and physical activity. These are beneficial for nearly all people with COPD.  Lung surgery or transplant.  Nutrition therapy to gain weight, if you are underweight.  Pulmonary rehabilitation. This may involve working with a team of health care providers and specialists, such as respiratory, occupational, and physical therapists. HOME CARE INSTRUCTIONS  Take all medicines  (inhaled or pills) as directed by your health care provider.  Avoid over-the-counter medicines or cough syrups that dry up your airway (such as antihistamines) and slow down the elimination of secretions unless instructed otherwise by your health care provider.  If you are a smoker, the most important thing that you can do is stop smoking. Continuing to smoke will cause further lung damage and breathing trouble. Ask your health care provider for help with quitting smoking. He or she can direct you to community resources or hospitals that provide support.  Avoid exposure to irritants such as smoke, chemicals, and fumes that aggravate your breathing.  Use oxygen therapy and pulmonary rehabilitation if directed by your health care provider. If you require home oxygen therapy, ask your health care provider whether you should purchase a pulse oximeter to measure your oxygen level at home.  Avoid contact with individuals who have a contagious illness.  Avoid extreme temperature and humidity changes.  Eat healthy foods. Eating smaller, more frequent meals and resting before meals may help you maintain your strength.  Stay active, but balance activity with periods of rest. Exercise and physical activity will help you maintain your ability to do things you want to do.  Preventing infection and hospitalization is very important when you have COPD. Make sure to receive all the vaccines your health care provider recommends, especially the pneumococcal and influenza vaccines. Ask your health care provider whether you need a pneumonia vaccine.  Learn and use relaxation techniques to manage stress.  Learn and use controlled breathing techniques as directed by your health care provider. Controlled breathing techniques include:  Pursed lip breathing. Start by breathing in (inhaling) through your nose for 1 second. Then, purse your lips as if you were   going to whistle and breathe out (exhale) through the  pursed lips for 2 seconds.  Diaphragmatic breathing. Start by putting one hand on your abdomen just above your waist. Inhale slowly through your nose. The hand on your abdomen should move out. Then purse your lips and exhale slowly. You should be able to feel the hand on your abdomen moving in as you exhale.  Learn and use controlled coughing to clear mucus from your lungs. Controlled coughing is a series of short, progressive coughs. The steps of controlled coughing are: 1. Lean your head slightly forward. 2. Breathe in deeply using diaphragmatic breathing. 3. Try to hold your breath for 3 seconds. 4. Keep your mouth slightly open while coughing twice. 5. Spit any mucus out into a tissue. 6. Rest and repeat the steps once or twice as needed. SEEK MEDICAL CARE IF:  You are coughing up more mucus than usual.  There is a change in the color or thickness of your mucus.  Your breathing is more labored than usual.  Your breathing is faster than usual. SEEK IMMEDIATE MEDICAL CARE IF:  You have shortness of breath while you are resting.  You have shortness of breath that prevents you from:  Being able to talk.  Performing your usual physical activities.  You have chest pain lasting longer than 5 minutes.  Your skin color is more cyanotic than usual.  You measure low oxygen saturations for longer than 5 minutes with a pulse oximeter. MAKE SURE YOU:  Understand these instructions.  Will watch your condition.  Will get help right away if you are not doing well or get worse.   This information is not intended to replace advice given to you by your health care provider. Make sure you discuss any questions you have with your health care provider.   Document Released: 06/21/2005 Document Revised: 10/02/2014 Document Reviewed: 05/08/2013 Elsevier Interactive Patient Education 2016 Elsevier Inc.  

## 2015-09-08 NOTE — Progress Notes (Signed)
Left via wheelchair accompanied by staff for discharge home in care of family.  Oxygen on 3L, [facility] to deliver oxygen at home today.  Stable at discharge.

## 2015-09-08 NOTE — Progress Notes (Addendum)
SATURATION QUALIFICATIONS: (This note is used to comply with regulatory documentation for home oxygen)  Patient Saturations on Room Air at Rest = 90%   Patient Saturations on Room Air while Ambulating = 77%  Patient Saturations on 3 Liters of oxygen while Ambulating = 92%  Please briefly explain why patient needs home oxygen: Sats decreased to 77 off oxygen while ambulating, did not appear to be in respiratory distress, ambulated with 2L with Sat up to 88%, then increased to 3L and Sat up to 92% while ambulating.

## 2015-09-08 NOTE — Care Management Note (Signed)
Case Management Note  Patient Details  Name: Erica [name] MRN: [id number] Date of Birth: March 26, 1951  Subjective/Objective:                    Action/Plan:   Expected Discharge Date:                  Expected Discharge Plan:  Home/Self Care  In-House Referral:  NA  Discharge planning Services  CM Consult  Post Acute Care Choice:  Durable Medical Equipment Choice offered to:  Patient  DME Arranged:  Oxygen DME Agency:  [place]:    [facility] Agency:     Status of Service:  Completed, signed off  Medicare Important Message Given:  Yes Date Medicare IM Given:    Medicare IM give by:    Date Additional Medicare IM Given:    Additional Medicare Important Message give by:     If discussed at [place] of Stay Meetings, dates discussed:    Additional Comments: Pt discharged home today with home O2 arranged with [facility] (per pts choice). Romualdo Bolk of [facility] is aware and will collect the pts information from the chart. Portable O2 will be delivered to pts room prior to discharge. Pt has concentrator already in the home from several years ago. Pt also has neb machine in place. No further CM needs noted. Pt and pts nurse aware of discharge arrangements. Christinia Gully [place], RN 09/08/2015, 3:11 PM

## 2015-09-08 NOTE — Discharge Summary (Signed)
[facility]  Physician Discharge Summary   Patient ID: Erica [name] MRN: [id number] DOB/AGE: 66-Jan-1952 66 y.o.  Admit date: 09/05/2015 Discharge date: 09/08/2015  PCP: Maggie Font, MD  DISCHARGE DIAGNOSES:  Active Problems:   COPD exacerbation (HCC)   Chronic pain syndrome   Hypertension   Diabetes mellitus type 2, controlled ([place])   Chronic respiratory failure (HCC)   RECOMMENDATIONS FOR OUTPATIENT FOLLOW UP: 1. Patient to use oxygen around-the-clock for now   DISCHARGE CONDITION: fair  Diet recommendation: Modified, carbohydrate  Filed Weights   09/05/15 1331 09/06/15 0521  Weight: 82.555 kg (182 lb) 72.439 kg (159 lb 11.2 oz)    INITIAL HISTORY: 64 year old presented with acute on chronic respiratory failure. She has history of COPD and states she only uses oxygen at night. Presented hypoxic and requiring supplemental oxygen. Diagnosed with COPD exacerbation.  Consultations:  None  Procedures:  None  HOSPITAL COURSE:   Acute COPD exacerbation/Likely Interstitial Lung Disease Patient was admitted to the hospital and started on nebulizer treatments, steroids and antibiotics. She started improving with this regimen. Steroids were tapered. Today, patient feels much better and wants to go home. She feels like she is back to her baseline. She uses oxygen only at nighttime. She was ambulated and her sats did drop into the 70s without causing her any distress. With oxygen it came up to 90s. She will be asked to wear her oxygen around-the-clock for now. She has a follow-up with her primary care physician next week. She'll be discharged on tapering doses of steroids and will remain on antibiotics for 3 more days. She likely has a component of interstitial lung disease as well based on x-ray findings. Interstitial changes noted.  Hyperkalemia Resolved after administration of kayexalate  Chronic pain syndrome Continue supportive therapy  Essential  Hypertension Patient may continue her home medications.  Diabetes mellitus type 2, controlled Continue home medications. Blood sugars were reasonably well controlled.  Patient feels much better. She was to go home today. Overall, she appears to be stable for discharge.   PERTINENT LABS:  The results of significant diagnostics from this hospitalization (including imaging, microbiology, ancillary and laboratory) are listed below for reference.     Labs: Basic Metabolic Panel:  Recent Labs Lab 09/05/15 1418 09/06/15 0703 09/07/15 0557  NA 142 141 139  K 4.1 5.6* 4.8  CL 105 103 101  CO2 31 32 34*  GLUCOSE 104* 154* 148*  BUN 25* 22* 29*  CREATININE 1.22* 1.12* 1.01*  CALCIUM 8.8* 8.6* 8.5*   Liver Function Tests:  Recent Labs Lab 09/05/15 1418  AST 23  ALT 24  ALKPHOS 74  BILITOT 0.6  PROT 6.9  ALBUMIN 3.7   CBC:  Recent Labs Lab 09/05/15 1418 09/06/15 0703  WBC 5.7 6.8  NEUTROABS 3.4  --   HGB 17.0* 15.9*  HCT 49.4* 48.0*  MCV 84.2 86.0  PLT 170 176    CBG:  Recent Labs Lab 09/07/15 1153 09/07/15 1638 09/07/15 2042 09/08/15 0753 09/08/15 1122  GLUCAP 116* 197* 107* 129* 126*     IMAGING STUDIES Dg Chest 2 View  09/05/2015  CLINICAL DATA:  Cough and shortness of Breath EXAM: CHEST - 2 VIEW COMPARISON:  \05/04/2015 FINDINGS: Cardiac shadow is stable. Diffuse interstitial changes are identified bilaterally stable from the prior exam and likely chronic in nature. No sizable infiltrate or effusion is seen. Mild hyperinflation is noted. IMPRESSION: Stable interstitial changes when compare with the prior exam. Electronically Signed   By:  Inez Catalina M.D.   On: 09/05/2015 16:49    DISCHARGE EXAMINATION: Filed Vitals:   09/07/15 2022 09/07/15 2132 09/08/15 0518 09/08/15 0813  BP:  140/69 130/76   Pulse: 87 90 88 87  Temp:  98.1 F (36.7 C) 98.2 F (36.8 C)   TempSrc:  Oral Oral   Resp:  18 20 18   Height:      Weight:      SpO2: 93% 93% 96%  93%   General appearance: alert, cooperative and no distress Resp: Few coarse crackles noted bilaterally in the bases, right more than left. No wheezing, rhonchi. Cardio: regular rate and rhythm, S1, S2 normal, no murmur, click, rub or gallop GI: soft, non-tender; bowel sounds normal; no masses,  no organomegaly Extremities: extremities normal, atraumatic, no cyanosis or edema Neurologic: No focal deficits  DISPOSITION: Home  Discharge Instructions    AMB Referral to [place] Management    Complete by:  As directed   Refer to [place] for transition of care.  Patient has 1 inpatient in past 48mos Hx of COPD Of note patient address is in [place]. Please contact for any questions or concerns. Royetta Crochet. Niemczura, RN, BSN, CCM  [facility] [phone]  Reason for consult:  [facility] community, transition of care  Expected date of contact:  1-3 days (reserved for hospital discharges)     Call MD for:  difficulty breathing, headache or visual disturbances    Complete by:  As directed      Call MD for:  extreme fatigue    Complete by:  As directed      Call MD for:  persistant dizziness or light-headedness    Complete by:  As directed      Call MD for:  persistant nausea and vomiting    Complete by:  As directed      Call MD for:  severe uncontrolled pain    Complete by:  As directed      Call MD for:  temperature >100.4    Complete by:  As directed      Diet Carb Modified    Complete by:  As directed      Discharge instructions    Complete by:  As directed   Please follow up with your PCP as planned. Use your O2 even during day time till seen by your PCP in follow up.  You were cared for by a hospitalist during your hospital stay. If you have any questions about your discharge medications or the care you received while you were in the hospital after you are discharged, you can call the unit and asked to speak with the hospitalist on call if the hospitalist that took  care of you is not available. Once you are discharged, your primary care physician will handle any further medical issues. Please note that NO REFILLS for any discharge medications will be authorized once you are discharged, as it is imperative that you return to your primary care physician (or establish a relationship with a primary care physician if you do not have one) for your aftercare needs so that they can reassess your need for medications and monitor your lab values. If you do not have a primary care physician, you can call [phone] for a physician referral.     Increase activity slowly    Complete by:  As directed            ALLERGIES: No Known Allergies  Current Discharge Medication List    START taking these medications   Details  albuterol (PROVENTIL) (2.5 MG/3ML) 0.083% nebulizer solution Take 3 mLs (2.5 mg total) by nebulization every 6 (six) hours. Qty: 75 mL, Refills: 12    levofloxacin (LEVAQUIN) 500 MG tablet Take 1 tablet (500 mg total) by mouth daily. For 4 more days Qty: 4 tablet, Refills: 0    predniSONE (DELTASONE) 20 MG tablet Take 3 tablets twice daily for 3 days, then take 3 tablets once daily for 3 days, then take 2 tablets once daily for 3 days, then take 1 tablet once daily for 3 days, then STOP. Qty: 36 tablet, Refills: 0      CONTINUE these medications which have NOT CHANGED   Details  aspirin EC 81 MG tablet Take 162 mg by mouth every morning.    gabapentin (NEURONTIN) 100 MG capsule Take 1 capsule by mouth daily.    meloxicam (MOBIC) 15 MG tablet Take 1 tablet by mouth at bedtime.    metFORMIN (GLUCOPHAGE) 500 MG tablet Take 500 mg by mouth 2 (two) times daily.    oxymorphone (OPANA ER) 7.5 MG TB12 Take 7.5 mg by mouth daily.    saxagliptin HCl (ONGLYZA) 5 MG TABS tablet Take 5 mg by mouth daily.    tiotropium (SPIRIVA) 18 MCG inhalation capsule Place 18 mcg into inhaler and inhale 2 (two) times daily.    valsartan-hydrochlorothiazide  (DIOVAN-HCT) 160-12.5 MG per tablet Take 1 tablet by mouth daily.      STOP taking these medications     permethrin (ELIMITE) 5 % cream        Follow-up Information    Go to Maggie Font, MD.   Specialty:  Family Medicine   Why:  as scheduled   Contact information:   [address] [place] [address] [phone]       TOTAL DISCHARGE TIME: 21 minutes  [facility] [facility] Pager [phone]  09/08/2015, 12:55 PM

## 2015-09-09 ENCOUNTER — Other Ambulatory Visit: Payer: Self-pay | Admitting: *Deleted

## 2015-09-10 ENCOUNTER — Other Ambulatory Visit: Payer: Self-pay

## 2015-09-10 DIAGNOSIS — M545 Low back pain: Secondary | ICD-10-CM | POA: Diagnosis not present

## 2015-09-10 DIAGNOSIS — E1122 Type 2 diabetes mellitus with diabetic chronic kidney disease: Secondary | ICD-10-CM | POA: Diagnosis not present

## 2015-09-10 DIAGNOSIS — N183 Chronic kidney disease, stage 3 (moderate): Secondary | ICD-10-CM | POA: Diagnosis not present

## 2015-09-10 DIAGNOSIS — J441 Chronic obstructive pulmonary disease with (acute) exacerbation: Secondary | ICD-10-CM

## 2015-09-10 DIAGNOSIS — J449 Chronic obstructive pulmonary disease, unspecified: Secondary | ICD-10-CM | POA: Diagnosis not present

## 2015-09-10 NOTE — Patient Outreach (Addendum)
This RN CM was successful in making contact with patient via telephone for initial transition of care call. Patient identified herself using HIP PA identifiers by providing date of birth and address.  Patient was recently discharged from acute for what she describes as a coughing episode.  Patient has history of COPD and diabetes.  Patient has made post acute care discharge for 12/22 with primary care in [place], however, she states she does not have gas money to make appointment.  Referral made to [facility] for assistance as patient states she would have to cancel because it takes a lot of gas to get here.  Patient states she lives in home with son who is employed and works at night Patient states she is able to afford her medications.  This RN CM collaborated to create her care management goals for managing her chronic illness.    Plan: Meet patient at appointment with primary care physician on December 22.

## 2015-09-10 NOTE — Patient Outreach (Deleted)
Per EPIC, patient remains in acute care. Will check for disposition on Monday, December 19

## 2015-09-16 ENCOUNTER — Other Ambulatory Visit: Payer: Self-pay

## 2015-09-17 ENCOUNTER — Other Ambulatory Visit: Payer: Self-pay | Admitting: Licensed Clinical Social Worker

## 2015-09-17 NOTE — Patient Outreach (Signed)
[facility]) Care Management  September 16, 2015  Erica [name] May 15, 1951 [id number]    Contact was made with patient who identified herself by providing date of birth and address. Contact made with patient to remind her of appointment with her primary care physician. Patient stated the doctor's office called today to change her appointment to Monday, December 26 and her care was broken down.  Patient states her car would be repaired by Saturday, December 24.  Patient states she needs assistance with purchasing automobile fuel for her car. This RNCM reminded patient that Monday is considered a holiday, THN's office will not be open so I will not be available to work with her.   Will follow up with patient next week for further assessment of care management needs.

## 2015-09-17 NOTE — Patient Outreach (Signed)
[facility]) Care Management  09/17/2015  Erica [name] May 21, 1951 [id number]   Assessment-CSW completed initial outreach to patient on 09/17/15. CSW was able to successfully reach her. Patient provided HIPPA verifications. CSW introduced self, reason for call and of [place] services. Patient has canceled MD appointments as she has not had enough money for gas in order to travel to appointments. Patient shares it usually takes her 1 hour to 1 hour and 1/2 to get to her medical appointments. Patient is eligible for financial assistance program as she has Medicaid. CSW will provide patient with a $25.00 gift card to be solely used for gas to her medical appointments.Patient expresses understanding. Patient will pick up her gift card at her medical appointment on 09/20/15. CSW contacted medical office to inform them that envelope will be left for patient to pick up on 09/20/15 for PCP appointment. Patient reports receiving $874.00 per month from [employer]. Patient lives alone. Patient denies any further social work assistance besides needing assistance with affording gas. Patient is agreeable to CSW mailing out financial resources for [employer] and a English as a second language teacher for [place]. CSW reviewed resources available to her over the phone. Patient confirmed mailing address. Patient is agreeable to outreach within two weeks. Patient made aware that CSW will be out of the office starting 09/20/15 and will return on 09/28/15.  Plan-CSW will send in basket message to Care Management Assistant in order to mail out needed resources. CSW will continue to assist patient with any social work needs.  Erica [name], Erica [name], Erica [name], [email or web address] Phone: [phone] Fax: [phone]

## 2015-09-20 DIAGNOSIS — J449 Chronic obstructive pulmonary disease, unspecified: Secondary | ICD-10-CM | POA: Diagnosis not present

## 2015-09-20 DIAGNOSIS — E1122 Type 2 diabetes mellitus with diabetic chronic kidney disease: Secondary | ICD-10-CM | POA: Diagnosis not present

## 2015-09-20 DIAGNOSIS — N183 Chronic kidney disease, stage 3 (moderate): Secondary | ICD-10-CM | POA: Diagnosis not present

## 2015-09-21 ENCOUNTER — Other Ambulatory Visit: Payer: Self-pay | Admitting: Pharmacist

## 2015-09-21 NOTE — Patient Outreach (Signed)
[facility]) Care Management  09/21/2015  AZJA MAXIM 1951/01/21 [id number]   Erica [name] is a 64 y.o. female who was referred to the [employer] for smoking cessation from [place].  I made outreach call to patient regarding smoking cessation.  Patient answered the phone and reports she is still interested in smoking cessation, but reported she did not feel like talking today.  Patient requested an outreach call tomorrow.  I will plan to call patient tomorrow, 09/22/15.     Elisabeth Most, Pharm.D. Pharmacy Resident [place] [phone]

## 2015-09-22 ENCOUNTER — Encounter: Payer: Self-pay | Admitting: Pharmacist

## 2015-09-22 ENCOUNTER — Other Ambulatory Visit: Payer: Self-pay | Admitting: Pharmacist

## 2015-09-22 NOTE — Patient Outreach (Signed)
[facility]) Care Management  09/22/2015  GOLDA MANDO Feb 12, 1951 [id number]   Request received from Eula Fried, LCSW to mail patient information on community resources and financial resources in [place]. Information mailed today, 09/22/15.   Josepha Pigg [facility] CM Assistant

## 2015-09-22 NOTE — Patient Outreach (Signed)
[facility]) Care Management  [place]   09/22/2015  CUCA MOREFIELD Jan 15, 1951 [id number]  Subjective: Erica [name] is a 64 y.o. female who was referred to the [employer] for smoking cessation from [place]. I received a phone call from Sheryn Bison with [facility] who reports patient is out of her Spiriva.  I made outreach call to patient regarding smoking cessation and Spiriva prescription. Patient confirms that she is currently out of Spiriva.  Patient reports she has been out for approximately one month.  Patient had recent hospitalization for COPD exacerbation.  Patient confirms that she completed course of levofloxacin and prednisone and has albuterol nebulizer available if needed.  Patient reports she has [employer] and [employer], but she does not have enough money to afford the prescription copays.  Patient also reports she is out of aspirin 81mg  tablets and she does not have enough money to pay for her Opana ER prescription.  I called patient's pharmacy [facility] in [place]) who reports that patient does have a refill of Spiriva available.  They report that patient has not filled the prescription for Spiriva since September 2016.  Patient reports she has a supply of metformin, Diovan HCT, Onglyza, and gabapentin.  Patient reports her supply will last her until her next paycheck at the beginning of January.    I also discussed smoking cessation with patient.  Patient reports she started using tobacco on a daily bases at the age of 64 yo.  She is currently smoking 10 to 11 cigarettes per day and she is working on reducing the number of cigarettes each day.  She reports her most recent quit attempt was 2 or 3 years ago and the longest time ever been tobacco free was 6 weeks.  During previous quit attempt she did not use any medications (NRT, bupropion, varenicline).  Patient  has current quit date set for 10/13/15.   Rates IMPORTANCE of quitting tobacco on 1-10 scale of 10 Rates READINESS of quitting tobacco on 1-10 scale of 10 Rates CONFIDENCE of quitting tobacco on 1-10 scale of 10 Triggers to use tobacco include: stress  Objective:   Current Medications: Current Outpatient Prescriptions  Medication Sig Dispense Refill  . albuterol (PROVENTIL) (2.5 MG/3ML) 0.083% nebulizer solution Take 3 mLs (2.5 mg total) by nebulization every 6 (six) hours. 75 mL 12  . gabapentin (NEURONTIN) 100 MG capsule Take 1 capsule by mouth daily.    . metFORMIN (GLUCOPHAGE) 500 MG tablet Take 500 mg by mouth 2 (two) times daily.    . saxagliptin HCl (ONGLYZA) 5 MG TABS tablet Take 5 mg by mouth daily. Reported on 09/22/2015    . valsartan-hydrochlorothiazide (DIOVAN-HCT) 160-12.5 MG per tablet Take 1 tablet by mouth daily.    Marland Kitchen aspirin EC 81 MG tablet Take 162 mg by mouth every morning. Reported on 09/22/2015    . meloxicam (MOBIC) 15 MG tablet Take 1 tablet by mouth at bedtime. Reported on 09/22/2015    . oxymorphone (OPANA ER) 7.5 MG TB12 Take 7.5 mg by mouth daily. Reported on 09/22/2015    . tiotropium (SPIRIVA) 18 MCG inhalation capsule Place 18 mcg into inhaler and inhale daily. Reported on 09/22/2015     No current facility-administered medications for this visit.   Functional Status: In your present state of health, do you have any difficulty performing the following activities: 09/06/2015  Hearing? N  Vision? N  Difficulty concentrating or making decisions? N  Walking or climbing stairs? N  Dressing or bathing? N  Doing errands, shopping? N   Fall/Depression Screening: PHQ 2/9 Scores 09/10/2015  PHQ - 2 Score 0    Assessment: 1.  Medication assistance:  Patient had recent hospitalization for COPD and qualifies for [place] [facility] for Spiriva and aspirin.  I informed patient that we would be unable to assist with the copay of controlled substance  prescriptions.  Patient reports she will be able to go pick up prescription for Spiriva if we arrange for medication to be covered by [place].  Counseled patient on purpose and proper use of Spiriva inhaler.  Counseled patient on different between maintenance inhaler (Spiriva) and rescue medication (albuterol) and educated patient on importance of using maintenance inhaler every day.  Discussed with patient the importance of setting aside money each month to cover her prescription medications.  Patient voices understanding and reports she will be able to pay for her prescription medications next month.     2.  Smoking cessation:  Patient appears motivated to quit smoking.  Patient reports she has already called 1 800-QUIT NOW line and has 21mg  nicotine patches and 4mg  nicotine gum at the house.  Recommended nicotine replacement treatment with 21mg  patches for 6 weeks followed by 14 mg patches for 2 weeks then 7 mg patches for 2 weeks to start on patient's quit date of 10/13/15.  Counseled patient to chew 1 piece of nicotine gum every 1 to 2 hours as needed (maximum: 24 pieces/ day).  Patient counseled on purpose, proper use, and potential adverse effects of both nicotine patch and gum.    Plan:  1.  I paid for patient's Spiriva and aspirin using [place] emergency fund.  Patient will go to [facility] to pick up her prescriptions and reports she will call me to confirm she picked up prescriptions.   2.  Patient will quit smoking on 10/13/15.  I mailed patient information regarding Quit Smart Class Schedule through [facility].     Elisabeth Most, Pharm.D. Pharmacy Resident [place] [phone]

## 2015-09-23 ENCOUNTER — Other Ambulatory Visit: Payer: Self-pay | Admitting: Pharmacist

## 2015-09-23 ENCOUNTER — Other Ambulatory Visit: Payer: Self-pay

## 2015-09-23 NOTE — Patient Outreach (Signed)
[facility]) Care Management  09/23/2015  Erica [name] 27-Jun-1951 [id number]   Erica [name] is a [phone] who I am following for medication assistance and smoking cessation.  I spoke to patient who confirms she was able to pick up her Spiriva from [facility] yesterday.  Counseled patient on purpose and proper use of Spiriva.  Patient voices understanding.  I will plan to follow up with patient in three weeks.     Elisabeth Most, Pharm.D. Pharmacy Resident [place] [phone]

## 2015-09-23 NOTE — Patient Outreach (Signed)
This RN CM was able to make contact with patient who identified herself by providing date of birth and address. Patient states she has been in contact with Elisabeth Most, [place] for Smoking Cessation information.  Patient advised this RNCM she had received the gift card to be used for purchasing gasoline for there car to transport her to medical appointments.  Patient was agreeable to disease management case management for further intervention.  Plan: Transfer case to [facility] Disease Management for chronic disease education.

## 2015-09-26 DIAGNOSIS — J449 Chronic obstructive pulmonary disease, unspecified: Secondary | ICD-10-CM | POA: Diagnosis not present

## 2015-10-04 ENCOUNTER — Other Ambulatory Visit: Payer: Self-pay | Admitting: Licensed Clinical Social Worker

## 2015-10-04 NOTE — Patient Outreach (Signed)
[facility]) Care Management  10/04/2015  Erica [name] August 23, 1951 [id number]   Assessment-CSW completed outreach to patient on 10/04/15. Patient answered phone and provided HIPPA verifications. Patient shares that she was able to gain $25.00 gift card which helped to afford her transportation cost to her medication appointments. Patient shares that she received community resource packet for [facility] as well as financial resources for [employer]. Patient reports that she was unable to afford energy bill which was over $600.00 for the month of December of 2016. Patient reports that she became aware of the [place] program at [facility] through the information that was provided by CSW. Patient reports that she went to social services and completed application and was granted $200.00 to pay for energy bill. Patient reports that she was able to pay $356.00 of energy bill and will start on a payment plan to pay the rest starting February of 2017. Patient appreciative of social work resources. Patient reports that she was unable to afford all of rent this past month ($349.00) as well but has great rapport with landlord who is working with her. Patient reports that she only has $100.00 left to pay landlord and stated "I'm not worried about him. He knows I will pay him back because we are friends." CSW took time review all of [place] resources with patient including the [employer] program which provides crisis assistance resources. Patient reports that she will keep resource information in a safe place in case she were to need information. Patient reports "I usually am able to pay my bills better but with the holidays it put me back some." CSW encouraged patient to have other members that live with her to provide assistance with bills as well. Patient expressed understanding. Patient denies needing any further social work  assistance but patient agrees to contact this CSW if ever needed.  Plan-CSW will update involved [place] staff of social work discharge.  Eula Fried, BSW, [email or web address], [email or web address] Phone: [phone] Fax: [phone]

## 2015-10-05 DIAGNOSIS — J449 Chronic obstructive pulmonary disease, unspecified: Secondary | ICD-10-CM | POA: Diagnosis not present

## 2015-10-13 ENCOUNTER — Other Ambulatory Visit: Payer: Self-pay | Admitting: Pharmacist

## 2015-10-13 NOTE — Patient Outreach (Signed)
[facility]) Care Management  [place]   10/13/2015  MANROOP DSOUZA 1951/07/13 [id number]  Subjective: Erica [name] is a 65yo who was referred to [place] for smoking cessation and medication assistance.  I made outreach call to patient to follow up on her medications.  Patient reports compliance with all medications.  Patient reports having all medications at this time and denies any difficulty affording her medications.  Patient has [employer] and [employer].  Patient reports she needs a refill of her glucose test strips.  She reports she tried to call [facility] mail order pharmacy to reorder test strips for her One Touch Ultra Mini glucometer but they told her she needed to get a new meter.  I confirmed with [employer] representative that preferred glucometers through [employer] are now [employer] or [employer].    [place] is no longer providing smoking cessation services. I have provided patient with other resources such as 1 800 QUITNOW and Quit Smart Classes through [facility].  Patient reports she has reduced number of cigarettes per day from 10-11 to 2 cigarettes daily.  Patient reports she has been using the 1 800-QUITNOW line and she received the information I sent her regarding Quit Smart Classes.    Objective:   Current Medications: Current Outpatient Prescriptions  Medication Sig Dispense Refill  . albuterol (PROVENTIL) (2.5 MG/3ML) 0.083% nebulizer solution Take 3 mLs (2.5 mg total) by nebulization every 6 (six) hours. 75 mL 12  . aspirin EC 81 MG tablet Take 162 mg by mouth every morning. Reported on 09/22/2015    . gabapentin (NEURONTIN) 100 MG capsule Take 1 capsule by mouth daily.    . meloxicam (MOBIC) 15 MG tablet Take 1 tablet by mouth at bedtime. Reported on 09/22/2015    . metFORMIN (GLUCOPHAGE) 500 MG tablet Take 500 mg by mouth 2 (two) times daily.    Marland Kitchen oxymorphone (OPANA ER) 7.5 MG TB12 Take 7.5 mg by mouth  daily. Reported on 09/22/2015    . saxagliptin HCl (ONGLYZA) 5 MG TABS tablet Take 5 mg by mouth daily. Reported on 09/22/2015    . tiotropium (SPIRIVA) 18 MCG inhalation capsule Place 18 mcg into inhaler and inhale daily. Reported on 09/22/2015    . valsartan-hydrochlorothiazide (DIOVAN-HCT) 160-12.5 MG per tablet Take 1 tablet by mouth daily.     No current facility-administered medications for this visit.   Functional Status: In your present state of health, do you have any difficulty performing the following activities: 09/06/2015  Hearing? N  Vision? N  Difficulty concentrating or making decisions? N  Walking or climbing stairs? N  Dressing or bathing? N  Doing errands, shopping? N   Fall/Depression Screening: PHQ 2/9 Scores 09/10/2015  PHQ - 2 Score 0    Assessment: 1.  Medication assistance:  Patient has [employer] and [employer].  Patient reports she has all medications at this time and denies any difficulty affording prescription copays.  Patient did report needing a new glucometer.  I reviewed glucometers that are covered by [employer].  Patient wrote down names and reports she will request a new glucometer from her primary care provider.    Plan: 1.  Patient will call primary care provider to request a prescription for a new glucometer.  I will also send a letter to patient's primary care provider listing preferred glucometers under patient's insurance plan.   2.  Patient denies any further pharmacy needs at this time.  Will close pharmacy program.  I will update involved [facility] staff of pharmacy discharge.  Provided patient with my phone number and encouraged her to call me if she has any questions or concerns regarding her medications.    Elisabeth Most, Pharm.D. Pharmacy Resident [place] [phone]

## 2015-10-14 ENCOUNTER — Encounter: Payer: Self-pay | Admitting: Pharmacist

## 2015-11-05 DIAGNOSIS — J449 Chronic obstructive pulmonary disease, unspecified: Secondary | ICD-10-CM | POA: Diagnosis not present

## 2015-11-08 DIAGNOSIS — H524 Presbyopia: Secondary | ICD-10-CM | POA: Diagnosis not present

## 2015-11-08 DIAGNOSIS — Z0100 Encounter for examination of eyes and vision without abnormal findings: Secondary | ICD-10-CM | POA: Diagnosis not present

## 2015-11-08 DIAGNOSIS — H52209 Unspecified astigmatism, unspecified eye: Secondary | ICD-10-CM | POA: Diagnosis not present

## 2015-11-08 DIAGNOSIS — H5210 Myopia, unspecified eye: Secondary | ICD-10-CM | POA: Diagnosis not present

## 2015-11-08 DIAGNOSIS — H5203 Hypermetropia, bilateral: Secondary | ICD-10-CM | POA: Diagnosis not present

## 2015-11-20 ENCOUNTER — Emergency Department (HOSPITAL_COMMUNITY): Payer: Commercial Managed Care - HMO

## 2015-11-20 ENCOUNTER — Encounter (HOSPITAL_COMMUNITY): Payer: Self-pay | Admitting: *Deleted

## 2015-11-20 ENCOUNTER — Inpatient Hospital Stay (HOSPITAL_COMMUNITY)
Admission: EM | Admit: 2015-11-20 | Discharge: 2015-11-23 | DRG: 190 | Disposition: A | Discharge status: Home Health | Payer: Commercial Managed Care - HMO | Attending: Internal Medicine | Admitting: Internal Medicine

## 2015-11-20 DIAGNOSIS — K219 Gastro-esophageal reflux disease without esophagitis: Secondary | ICD-10-CM | POA: Diagnosis present

## 2015-11-20 DIAGNOSIS — I5033 Acute on chronic diastolic (congestive) heart failure: Secondary | ICD-10-CM | POA: Diagnosis not present

## 2015-11-20 DIAGNOSIS — Z23 Encounter for immunization: Secondary | ICD-10-CM

## 2015-11-20 DIAGNOSIS — E1165 Type 2 diabetes mellitus with hyperglycemia: Secondary | ICD-10-CM | POA: Diagnosis present

## 2015-11-20 DIAGNOSIS — G894 Chronic pain syndrome: Secondary | ICD-10-CM | POA: Diagnosis present

## 2015-11-20 DIAGNOSIS — T380X5A Adverse effect of glucocorticoids and synthetic analogues, initial encounter: Secondary | ICD-10-CM | POA: Diagnosis present

## 2015-11-20 DIAGNOSIS — I10 Essential (primary) hypertension: Secondary | ICD-10-CM | POA: Diagnosis not present

## 2015-11-20 DIAGNOSIS — F17210 Nicotine dependence, cigarettes, uncomplicated: Secondary | ICD-10-CM | POA: Diagnosis not present

## 2015-11-20 DIAGNOSIS — J9601 Acute respiratory failure with hypoxia: Secondary | ICD-10-CM | POA: Diagnosis not present

## 2015-11-20 DIAGNOSIS — R0602 Shortness of breath: Secondary | ICD-10-CM

## 2015-11-20 DIAGNOSIS — Z833 Family history of diabetes mellitus: Secondary | ICD-10-CM

## 2015-11-20 DIAGNOSIS — Z720 Tobacco use: Secondary | ICD-10-CM | POA: Diagnosis present

## 2015-11-20 DIAGNOSIS — I110 Hypertensive heart disease with heart failure: Secondary | ICD-10-CM | POA: Diagnosis present

## 2015-11-20 DIAGNOSIS — Z8249 Family history of ischemic heart disease and other diseases of the circulatory system: Secondary | ICD-10-CM

## 2015-11-20 DIAGNOSIS — J441 Chronic obstructive pulmonary disease with (acute) exacerbation: Secondary | ICD-10-CM | POA: Diagnosis not present

## 2015-11-20 HISTORY — DX: Type 2 diabetes mellitus without complications: E11.9

## 2015-11-20 HISTORY — DX: Essential (primary) hypertension: I10

## 2015-11-20 LAB — CBC WITH DIFFERENTIAL/PLATELET
BASOS ABS: 0 10*3/uL (ref 0.0–0.1)
BASOS PCT: 0 %
EOS ABS: 0 10*3/uL (ref 0.0–0.7)
Eosinophils Relative: 0 %
HCT: 46.7 % — ABNORMAL HIGH (ref 36.0–46.0)
HEMOGLOBIN: 16.3 g/dL — AB (ref 12.0–15.0)
Lymphocytes Relative: 6 %
Lymphs Abs: 0.6 10*3/uL — ABNORMAL LOW (ref 0.7–4.0)
MCH: 30 pg (ref 26.0–34.0)
MCHC: 34.9 g/dL (ref 30.0–36.0)
MCV: 85.8 fL (ref 78.0–100.0)
MONOS PCT: 8 %
Monocytes Absolute: 0.8 10*3/uL (ref 0.1–1.0)
NEUTROS PCT: 86 %
Neutro Abs: 8.1 10*3/uL — ABNORMAL HIGH (ref 1.7–7.7)
Platelets: 157 10*3/uL (ref 150–400)
RBC: 5.44 MIL/uL — ABNORMAL HIGH (ref 3.87–5.11)
RDW: 14.1 % (ref 11.5–15.5)
WBC: 9.5 10*3/uL (ref 4.0–10.5)

## 2015-11-20 LAB — BLOOD GAS, ARTERIAL
ACID-BASE EXCESS: 5.5 mmol/L — AB (ref 0.0–2.0)
BICARBONATE: 27.8 meq/L — AB (ref 20.0–24.0)
Drawn by: 23534
O2 Content: 5 L/min
O2 Saturation: 90.5 %
PCO2 ART: 55.7 mmHg — AB (ref 35.0–45.0)
PH ART: 7.36 (ref 7.350–7.450)
pO2, Arterial: 62.3 mmHg — ABNORMAL LOW (ref 80.0–100.0)

## 2015-11-20 LAB — HEPATIC FUNCTION PANEL
ALBUMIN: 3.8 g/dL (ref 3.5–5.0)
ALK PHOS: 69 U/L (ref 38–126)
ALT: 13 U/L — ABNORMAL LOW (ref 14–54)
AST: 18 U/L (ref 15–41)
BILIRUBIN TOTAL: 0.4 mg/dL (ref 0.3–1.2)
Bilirubin, Direct: <0.1 mg/dL — ABNORMAL LOW (ref 0.1–0.5)
TOTAL PROTEIN: 6.9 g/dL (ref 6.5–8.1)

## 2015-11-20 LAB — BASIC METABOLIC PANEL
ANION GAP: 7 (ref 5–15)
BUN: 20 mg/dL (ref 6–20)
CALCIUM: 8.8 mg/dL — AB (ref 8.9–10.3)
CHLORIDE: 98 mmol/L — AB (ref 101–111)
CO2: 32 mmol/L (ref 22–32)
CREATININE: 1.15 mg/dL — AB (ref 0.44–1.00)
GFR calc non Af Amer: 48 mL/min — ABNORMAL LOW (ref >60)
GFR, EST AFRICAN AMERICAN: 56 mL/min — AB (ref >60)
Glucose, Bld: 111 mg/dL — ABNORMAL HIGH (ref 65–99)
Potassium: 4.3 mmol/L (ref 3.5–5.1)
SODIUM: 137 mmol/L (ref 135–145)

## 2015-11-20 LAB — GLUCOSE, CAPILLARY
GLUCOSE-CAPILLARY: 221 mg/dL — AB (ref 65–99)
Glucose-Capillary: 182 mg/dL — ABNORMAL HIGH (ref 65–99)

## 2015-11-20 LAB — D-DIMER, QUANTITATIVE: D-Dimer, Quant: 0.29 ug/mL-FEU (ref 0.00–0.50)

## 2015-11-20 LAB — TSH: TSH: 0.484 u[IU]/mL (ref 0.350–4.500)

## 2015-11-20 LAB — INFLUENZA PANEL BY PCR (TYPE A & B)
H1N1 flu by pcr: NOT DETECTED
Influenza A By PCR: NEGATIVE
Influenza B By PCR: NEGATIVE

## 2015-11-20 LAB — PHOSPHORUS: PHOSPHORUS: 3.2 mg/dL (ref 2.5–4.6)

## 2015-11-20 LAB — MAGNESIUM: Magnesium: 1.8 mg/dL (ref 1.7–2.4)

## 2015-11-20 LAB — BRAIN NATRIURETIC PEPTIDE: B NATRIURETIC PEPTIDE 5: 133 pg/mL — AB (ref 0.0–100.0)

## 2015-11-20 LAB — TROPONIN I

## 2015-11-20 LAB — I-STAT CG4 LACTIC ACID, ED: Lactic Acid, Venous: 1.33 mmol/L (ref 0.5–2.0)

## 2015-11-20 MED ORDER — PREDNISONE 50 MG PO TABS
60.0000 mg | ORAL_TABLET | Freq: Once | ORAL | Status: AC
  Administered 2015-11-20: 60 mg via ORAL
  Filled 2015-11-20: qty 1

## 2015-11-20 MED ORDER — ONDANSETRON HCL 4 MG/2ML IJ SOLN: 4.0000 mg | Freq: Four times a day (QID) | INTRAMUSCULAR | Status: DC | PRN

## 2015-11-20 MED ORDER — DOXYCYCLINE HYCLATE 100 MG PO TABS
100.0000 mg | ORAL_TABLET | Freq: Two times a day (BID) | ORAL | Status: DC
  Administered 2015-11-20 – 2015-11-23 (×6): 100 mg via ORAL
  Filled 2015-11-20 (×6): qty 1

## 2015-11-20 MED ORDER — CETYLPYRIDINIUM CHLORIDE 0.05 % MT LIQD
7.0000 mL | Freq: Two times a day (BID) | OROMUCOSAL | Status: DC
  Administered 2015-11-20 – 2015-11-22 (×2): 7 mL via OROMUCOSAL

## 2015-11-20 MED ORDER — PNEUMOCOCCAL VAC POLYVALENT 25 MCG/0.5ML IJ INJ
0.5000 mL | INJECTION | INTRAMUSCULAR | Status: AC
  Administered 2015-11-21: 0.5 mL via INTRAMUSCULAR
  Filled 2015-11-20: qty 0.5

## 2015-11-20 MED ORDER — SODIUM CHLORIDE 0.9 % IV SOLN: 250.0000 mL | INTRAVENOUS | Status: DC | PRN

## 2015-11-20 MED ORDER — IPRATROPIUM-ALBUTEROL 0.5-2.5 (3) MG/3ML IN SOLN
3.0000 mL | Freq: Once | RESPIRATORY_TRACT | Status: AC
  Administered 2015-11-20: 3 mL via RESPIRATORY_TRACT
  Filled 2015-11-20: qty 3

## 2015-11-20 MED ORDER — INFLUENZA VAC SPLIT QUAD 0.5 ML IM SUSY
0.5000 mL | PREFILLED_SYRINGE | INTRAMUSCULAR | Status: AC
  Administered 2015-11-21: 0.5 mL via INTRAMUSCULAR
  Filled 2015-11-20: qty 0.5

## 2015-11-20 MED ORDER — HYDRALAZINE HCL 20 MG/ML IJ SOLN: 10.0000 mg | Freq: Three times a day (TID) | INTRAMUSCULAR | Status: DC | PRN

## 2015-11-20 MED ORDER — FUROSEMIDE 10 MG/ML IJ SOLN
40.0000 mg | Freq: Every day | INTRAMUSCULAR | Status: DC
Start: 2015-11-20 — End: 2015-11-21
  Administered 2015-11-20 – 2015-11-21 (×2): 40 mg via INTRAVENOUS
  Filled 2015-11-20 (×2): qty 4

## 2015-11-20 MED ORDER — IPRATROPIUM BROMIDE 0.02 % IN SOLN
0.5000 mg | Freq: Once | RESPIRATORY_TRACT | Status: AC
  Administered 2015-11-20: 0.5 mg via RESPIRATORY_TRACT
  Filled 2015-11-20: qty 2.5

## 2015-11-20 MED ORDER — ALBUTEROL SULFATE (2.5 MG/3ML) 0.083% IN NEBU: 2.5000 mg | INHALATION_SOLUTION | RESPIRATORY_TRACT | Status: DC | PRN

## 2015-11-20 MED ORDER — ASPIRIN EC 81 MG PO TBEC
81.0000 mg | DELAYED_RELEASE_TABLET | Freq: Every day | ORAL | Status: DC
  Administered 2015-11-21 – 2015-11-23 (×3): 81 mg via ORAL
  Filled 2015-11-20 (×5): qty 1

## 2015-11-20 MED ORDER — PANTOPRAZOLE SODIUM 40 MG PO TBEC
40.0000 mg | DELAYED_RELEASE_TABLET | Freq: Every day | ORAL | Status: DC
  Administered 2015-11-20 – 2015-11-23 (×4): 40 mg via ORAL
  Filled 2015-11-20 (×4): qty 1

## 2015-11-20 MED ORDER — GUAIFENESIN ER 600 MG PO TB12
600.0000 mg | ORAL_TABLET | Freq: Two times a day (BID) | ORAL | Status: DC
  Administered 2015-11-20 – 2015-11-23 (×6): 600 mg via ORAL
  Filled 2015-11-20 (×6): qty 1

## 2015-11-20 MED ORDER — INSULIN ASPART 100 UNIT/ML ~~LOC~~ SOLN
0.0000 [IU] | Freq: Every day | SUBCUTANEOUS | Status: DC
  Administered 2015-11-20: 2 [IU] via SUBCUTANEOUS
  Administered 2015-11-21: 3 [IU] via SUBCUTANEOUS
  Administered 2015-11-22: 2 [IU] via SUBCUTANEOUS

## 2015-11-20 MED ORDER — ACETAMINOPHEN 325 MG PO TABS
650.0000 mg | ORAL_TABLET | Freq: Once | ORAL | Status: AC
  Administered 2015-11-20: 650 mg via ORAL

## 2015-11-20 MED ORDER — MAGNESIUM SULFATE 2 GM/50ML IV SOLN
2.0000 g | Freq: Once | INTRAVENOUS | Status: AC
  Administered 2015-11-20: 2 g via INTRAVENOUS
  Filled 2015-11-20: qty 50

## 2015-11-20 MED ORDER — ACETAMINOPHEN 650 MG RE SUPP: 650.0000 mg | Freq: Four times a day (QID) | RECTAL | Status: DC | PRN

## 2015-11-20 MED ORDER — METHYLPREDNISOLONE SODIUM SUCC 125 MG IJ SOLR
80.0000 mg | Freq: Once | INTRAMUSCULAR | Status: AC
  Administered 2015-11-20: 12:00:00 via INTRAVENOUS
  Filled 2015-11-20: qty 2

## 2015-11-20 MED ORDER — BUDESONIDE 0.25 MG/2ML IN SUSP
0.2500 mg | Freq: Two times a day (BID) | RESPIRATORY_TRACT | Status: DC
  Administered 2015-11-20 – 2015-11-23 (×6): 0.25 mg via RESPIRATORY_TRACT
  Filled 2015-11-20 (×6): qty 2

## 2015-11-20 MED ORDER — MORPHINE SULFATE (PF) 2 MG/ML IV SOLN
1.0000 mg | INTRAVENOUS | Status: DC | PRN
  Administered 2015-11-21: 1 mg via INTRAVENOUS
  Filled 2015-11-20 (×2): qty 1

## 2015-11-20 MED ORDER — FUROSEMIDE 10 MG/ML IJ SOLN
40.0000 mg | Freq: Once | INTRAMUSCULAR | Status: AC
  Administered 2015-11-20: 40 mg via INTRAVENOUS
  Filled 2015-11-20: qty 4

## 2015-11-20 MED ORDER — CHLORHEXIDINE GLUCONATE 0.12 % MT SOLN
15.0000 mL | Freq: Two times a day (BID) | OROMUCOSAL | Status: DC
  Administered 2015-11-20 – 2015-11-23 (×6): 15 mL via OROMUCOSAL
  Filled 2015-11-20 (×6): qty 15

## 2015-11-20 MED ORDER — ACETAMINOPHEN 325 MG PO TABS
ORAL_TABLET | ORAL | Status: AC
  Filled 2015-11-20: qty 2

## 2015-11-20 MED ORDER — SODIUM CHLORIDE 0.9% FLUSH
3.0000 mL | Freq: Two times a day (BID) | INTRAVENOUS | Status: DC
  Administered 2015-11-20 – 2015-11-23 (×6): 3 mL via INTRAVENOUS

## 2015-11-20 MED ORDER — IPRATROPIUM-ALBUTEROL 0.5-2.5 (3) MG/3ML IN SOLN
3.0000 mL | Freq: Four times a day (QID) | RESPIRATORY_TRACT | Status: DC
  Administered 2015-11-20 – 2015-11-21 (×5): 3 mL via RESPIRATORY_TRACT
  Filled 2015-11-20 (×5): qty 3

## 2015-11-20 MED ORDER — OXYMORPHONE HCL ER 7.5 MG PO T12A: 1.0000 | EXTENDED_RELEASE_TABLET | Freq: Every day | ORAL | Status: DC

## 2015-11-20 MED ORDER — ONDANSETRON HCL 4 MG PO TABS: 4.0000 mg | ORAL_TABLET | Freq: Four times a day (QID) | ORAL | Status: DC | PRN

## 2015-11-20 MED ORDER — SODIUM CHLORIDE 0.9% FLUSH: 3.0000 mL | INTRAVENOUS | Status: DC | PRN

## 2015-11-20 MED ORDER — ALBUTEROL (5 MG/ML) CONTINUOUS INHALATION SOLN
10.0000 mg/h | INHALATION_SOLUTION | Freq: Once | RESPIRATORY_TRACT | Status: AC
  Administered 2015-11-20: 10 mg/h via RESPIRATORY_TRACT
  Filled 2015-11-20: qty 20

## 2015-11-20 MED ORDER — METHYLPREDNISOLONE SODIUM SUCC 125 MG IJ SOLR
80.0000 mg | Freq: Three times a day (TID) | INTRAMUSCULAR | Status: DC
  Administered 2015-11-20 – 2015-11-23 (×7): 80 mg via INTRAVENOUS
  Filled 2015-11-20 (×7): qty 2

## 2015-11-20 MED ORDER — ACETAMINOPHEN 325 MG PO TABS
650.0000 mg | ORAL_TABLET | Freq: Four times a day (QID) | ORAL | Status: DC | PRN
  Administered 2015-11-21: 650 mg via ORAL
  Filled 2015-11-20: qty 2

## 2015-11-20 MED ORDER — INSULIN ASPART 100 UNIT/ML ~~LOC~~ SOLN
0.0000 [IU] | Freq: Three times a day (TID) | SUBCUTANEOUS | Status: DC
  Administered 2015-11-21: 5 [IU] via SUBCUTANEOUS
  Administered 2015-11-21: 2 [IU] via SUBCUTANEOUS
  Administered 2015-11-22: 3 [IU] via SUBCUTANEOUS
  Administered 2015-11-22 – 2015-11-23 (×2): 2 [IU] via SUBCUTANEOUS

## 2015-11-20 MED ORDER — ASPIRIN 81 MG PO CHEW
324.0000 mg | CHEWABLE_TABLET | Freq: Once | ORAL | Status: AC
  Administered 2015-11-20: 324 mg via ORAL
  Filled 2015-11-20: qty 4

## 2015-11-20 MED ORDER — HEPARIN SODIUM (PORCINE) 5000 UNIT/ML IJ SOLN
5000.0000 [IU] | Freq: Three times a day (TID) | INTRAMUSCULAR | Status: DC
  Administered 2015-11-20 – 2015-11-23 (×7): 5000 [IU] via SUBCUTANEOUS
  Filled 2015-11-20 (×7): qty 1

## 2015-11-20 NOTE — ED Notes (Addendum)
Attempted to call report. Nurse to call back. 

## 2015-11-20 NOTE — ED Notes (Signed)
Pt reports body aches, chills, cough (non productive) x 1 week. Denies n/v/d.

## 2015-11-20 NOTE — H&P (Signed)
[facility] History and Physical  Erica [name] [id number] DOB: March 02, 1951 DOA: 11/20/2015  Referring physician: Dr. Wyvonnia Dusky PCP: Maggie Font, MD   Chief Complaint: SOB, myalgia, productive cough  HPI: Erica [name] is a 65 y.o. female with PMH significant for HTN, type 2 diabetes, ongoing tobacco abuse and COPD; who came to ED complaining of 4-5 days worsening SOB, productive cough, myalgias and chills/sweating. Patient reports has failed to improved with use of nebulizer and inhalers at home. In fact they have continue worsening and are now associated with HA's and general malaise. She denies CP, objective fever, nausea, vomiting, abd pain, dysuria, or any other complaints. In Ed she was found to be in acute resp distress, hypoxic on RA, tachypneic, tachycardic and with diffuse wheezing. Patient escalated to require BIPAP due to resp failure. CXR with bronchitic changes, vascular congestion and interstitial edema. [facility] called to admit patient for further evaluation and treatment.  Review of Systems:  Negative except as mentioned on HPI.  Past Medical History  Diagnosis Date  . Diabetes mellitus without complication ([place])   . Hypertension   . COPD (chronic obstructive pulmonary disease) ([place])    History reviewed. No pertinent past surgical history.   Social History:  reports that she has been smoking Cigarettes.  She has been smoking about 1.00 pack per day. She does not have any smokeless tobacco history on file. She reports that she does not drink alcohol. Her drug history is not on file.  No Known Allergies  Family History: for HTN and diabetes.  Prior to Admission medications   Medication Sig Start Date End Date Taking? Authorizing Provider  albuterol (PROVENTIL) (2.5 MG/3ML) 0.083% nebulizer solution Take 2.5 mg by nebulization every 6 (six) hours as needed for wheezing or shortness of breath.  09/09/15  Yes Historical Provider, MD  aspirin 81 MG tablet Take 1  tablet by mouth daily. 10/22/15  Yes Historical Provider, MD  famotidine (PEPCID) 20 MG tablet Take 1 tablet by mouth 2 (two) times daily. 09/21/15  Yes Historical Provider, MD  lidocaine (XYLOCAINE) 5 % ointment Apply 1 application topically 2 (two) times daily. 09/23/15  Yes Historical Provider, MD  lidocaine-prilocaine (EMLA) cream Apply 1 application topically daily as needed (port use).  11/16/15  Yes Historical Provider, MD  OPANA ER, CRUSH RESISTANT, 7.5 MG T12A Take 1 tablet by mouth daily. 09/21/15  Yes Historical Provider, MD  pantoprazole (PROTONIX) 40 MG tablet Take 1 tablet by mouth daily. 10/28/15  Yes Historical Provider, MD  predniSONE (DELTASONE) 20 MG tablet Take by mouth See admin instructions. Take 3 tabs twice daily for 3 days, then 3 tabs daily x 3 days, then 2 tabs once daily for 3 days. 10/28/15  Yes Historical Provider, MD  saxagliptin HCl (ONGLYZA) 5 MG TABS tablet Take 5 mg by mouth daily.   Yes Historical Provider, MD  SPIRIVA HANDIHALER 18 MCG inhalation capsule Place 1 capsule into inhaler and inhale daily. 09/22/15  Yes Historical Provider, MD  valsartan-hydrochlorothiazide (DIOVAN-HCT) 160-12.5 MG tablet Take 1 tablet by mouth daily.   Yes Historical Provider, MD   Physical Exam: Filed Vitals:   11/20/15 1459 11/20/15 1514 11/20/15 1524 11/20/15 1600  BP:   117/63 113/55  Pulse: 95  99 84  Temp:  97.9 F (36.6 C)    TempSrc:  Axillary    Resp: 38  27 26  Height:      Weight:      SpO2: 92%  94% 96%  Wt Readings from Last 3 Encounters:  11/20/15 74.844 kg (165 lb)    General:  Afebrile, in mild resp distress; tachypneic in high 20's. Denies CP. On BIPAP and with difficulty speaking in full sentences. Eyes: PERRL, normal lids, irises & conjunctiva, no icterus, no nystagmus ENT: grossly normal hearing, moist MM; no erythema, no exudates, no thrush. Patient without drainage out of ears or nostrils  Neck: no LAD, masses or thyromegaly, no JVD Cardiovascular:  mild tachycardia, no m/r/g. No LE edema. Respiratory: tachypneic, coarse breath sounds, positive exp wheezing, diffuse rhonchi. No use of accessory muscles  Abdomen: soft, nt, nd, positive BS Skin: no rash, open wounds or induration seen on limited exam Musculoskeletal: grossly normal tone BUE/BLE, FROM Psychiatric: grossly normal mood and affect, speech fluent and appropriate Neurologic: grossly non-focal.          Labs on Admission:  Basic Metabolic Panel:  Recent Labs Lab 11/20/15 1024  NA 137  K 4.3  CL 98*  CO2 32  GLUCOSE 111*  BUN 20  CREATININE 1.15*  CALCIUM 8.8*   CBC:  Recent Labs Lab 11/20/15 1024  WBC 9.5  NEUTROABS 8.1*  HGB 16.3*  HCT 46.7*  MCV 85.8  PLT 157   Cardiac Enzymes:  Recent Labs Lab 11/20/15 1024  TROPONINI <0.03    BNP (last 3 results)  Recent Labs  11/20/15 1024  BNP 133.0*   CBG:  Recent Labs Lab 11/20/15 1509  GLUCAP 182*    Radiological Exams on Admission: Dg Chest Port 1 View  11/20/2015  CLINICAL DATA:  Shortness of breath for 1 week. EXAM: PORTABLE CHEST 1 VIEW COMPARISON:  None. FINDINGS: Cardiomegaly, pulmonary vascular congestion and interstitial opacities noted, likely representing interstitial edema. No definite pleural effusions or pneumothorax. No acute bony abnormalities are present. IMPRESSION: Pulmonary is scratch a cardiomegaly, pulmonary vascular congestion and probable interstitial pulmonary edema. Electronically Signed   By: Margarette Canada M.D.   On: 11/20/2015 11:27    EKG:  Sinus tachycardia, non-specific T waves abnormalities, LAE.  Assessment/Plan 1-Acute respiratory failure with hypoxia ([place]): appears to be secondary to bronchitis and COPD exacerbation. Some vascular congestion and interstitial edema on CXR, no prior hx of CHF. -patient required BIPAP on admission for resp failure -admitted to stepdown -ABG ok -started on steroids, nebulizer treatment, antibiotics, flutter valve and  mucinex -IV lasix and 2-D echo ordered -strict daily weights -modified carb and low sodium diet to be given once off BIPAP -daily weights, strict intake and output -neg troponin and influenza by PCR  2-COPD exacerbation ([place]): -treatment as mentioned above -patient still smoking -cessation counseling provided -nicotine patch ordered  3-Benign essential HTN: -BP overall stable -will use lasix -holding losartan and HCTZ overnight -follow VS and adjust antihypertensives as needed   4-GERD (gastroesophageal reflux disease) -continue PPI  5-Type 2 diabetes mellitus with hyperglycemia ([place]): -will hold oral agents while inpatient -will use SSI -follow A1C  6-chronic pain syndrome -continue home analgesic regimen (Opana) -PRN morphine for breakthrough/uncontrolled pain    Code Status: Full DVT Prophylaxis:heparin Family Communication: daughter at bedside Disposition Plan: inpatient, stepdown, LOS > 2 midnights.  Time spent:65 minutes  Barton Dubois [facility] Pager [phone]

## 2015-11-20 NOTE — ED Notes (Signed)
O2 sats decreased to 67 on room air with good pleth showing. PT placed on 6L [place] initially and O2 sats increased to 95%. EDP to bedside. RT paged for tx and blood gas. Pt states she uses O2 at home prn at night.

## 2015-11-20 NOTE — ED Provider Notes (Signed)
CSN: [id number]     Arrival date & time 11/20/15  [id number] History  By signing my name below, I, Altamease Oiler, attest that this documentation has been prepared under the direction and in the presence of Ezequiel Essex, MD. Electronically Signed: Altamease Oiler, ED Scribe. 11/20/2015. 10:18 AM   Chief Complaint  Patient presents with  . Generalized Body Aches    The history is provided by the patient. No language interpreter was used.   Erica [name] is a 65 y.o. female with history of DM, HTN, and COPD who presents to the Emergency Department complaining of diffuse myalgias with onset 1 week ago. Associated symptoms include sweating, dry cough, SOB not improved by nebulizer treatments or albuterol and Spiriva inhalers, myalgias, and headache. She uses her inhalers and nebulizer machine as needed and has used them everyday this week. Her last breathing treatment was just over 1 hour ago. She uses oxygen at home as needed. Pt denies change in appetite, fever, sore throat, chest pain, abdominal pain, n/v/d, and LE pain or swelling. Pt is a smoker. Her friend is ill with similar symptoms. She did not have a flu shot.    Past Medical History  Diagnosis Date  . Diabetes mellitus without complication ([place])   . Hypertension   . COPD (chronic obstructive pulmonary disease) ([place])    History reviewed. No pertinent past surgical history. History reviewed. No pertinent family history. Social History  Substance Use Topics  . Smoking status: Current Every Day Smoker -- 1.00 packs/day    Types: Cigarettes  . Smokeless tobacco: None  . Alcohol Use: No   OB History    No data available     Review of Systems  10 Systems reviewed and all are negative for acute change except as noted in the HPI.  Allergies  Review of patient's allergies indicates no known allergies.  Home Medications   Prior to Admission medications   Medication Sig Start Date End Date Taking? Authorizing Provider  albuterol  (PROVENTIL) (2.5 MG/3ML) 0.083% nebulizer solution Take 2.5 mg by nebulization every 6 (six) hours as needed for wheezing or shortness of breath.  09/09/15  Yes Historical Provider, MD  aspirin 81 MG tablet Take 1 tablet by mouth daily. 10/22/15  Yes Historical Provider, MD  famotidine (PEPCID) 20 MG tablet Take 1 tablet by mouth 2 (two) times daily. 09/21/15  Yes Historical Provider, MD  lidocaine (XYLOCAINE) 5 % ointment Apply 1 application topically 2 (two) times daily. 09/23/15  Yes Historical Provider, MD  lidocaine-prilocaine (EMLA) cream Apply 1 application topically daily as needed (port use).  11/16/15  Yes Historical Provider, MD  OPANA ER, CRUSH RESISTANT, 7.5 MG T12A Take 1 tablet by mouth daily. 09/21/15  Yes Historical Provider, MD  pantoprazole (PROTONIX) 40 MG tablet Take 1 tablet by mouth daily. 10/28/15  Yes Historical Provider, MD  predniSONE (DELTASONE) 20 MG tablet Take by mouth See admin instructions. Take 3 tabs twice daily for 3 days, then 3 tabs daily x 3 days, then 2 tabs once daily for 3 days. 10/28/15  Yes Historical Provider, MD  saxagliptin HCl (ONGLYZA) 5 MG TABS tablet Take 5 mg by mouth daily.   Yes Historical Provider, MD  SPIRIVA HANDIHALER 18 MCG inhalation capsule Place 1 capsule into inhaler and inhale daily. 09/22/15  Yes Historical Provider, MD  valsartan-hydrochlorothiazide (DIOVAN-HCT) 160-12.5 MG tablet Take 1 tablet by mouth daily.   Yes Historical Provider, MD   BP 146/59 mmHg  Pulse 122  Temp(Src) 99.1  F (37.3 C) (Oral)  Resp 20  Ht 5\' 4"  (1.626 m)  Wt 165 lb (74.844 kg)  BMI 28.31 kg/m2  SpO2 99%  LMP  Physical Exam  Constitutional: She is oriented to person, place, and time. She appears well-developed and well-nourished. She appears distressed.  HENT:  Head: Normocephalic and atraumatic.  Mouth/Throat: Oropharynx is clear and moist. No oropharyngeal exudate.  Eyes: Conjunctivae and EOM are normal. Pupils are equal, round, and reactive to light.   Neck: Normal range of motion. Neck supple.  No meningismus.  Cardiovascular: Regular rhythm, normal heart sounds and intact distal pulses.   No murmur heard. Tachycardic to the 12os  Pulmonary/Chest: Effort normal. No respiratory distress. She exhibits no tenderness.  Dyspneic with conversation Decreased breath sounds with scattered wheezing  Abdominal: Soft. There is no tenderness. There is no rebound and no guarding.  Musculoskeletal: Normal range of motion. She exhibits no edema or tenderness.  No LE edema  Neurological: She is alert and oriented to person, place, and time. No cranial nerve deficit. She exhibits normal muscle tone. Coordination normal.  No ataxia on finger to nose bilaterally. No pronator drift. 5/5 strength throughout. CN 2-12 intact.Equal grip strength. Sensation intact.   Skin: Skin is warm.  Psychiatric: She has a normal mood and affect. Her behavior is normal.  Nursing note and vitals reviewed.   ED Course  Procedures (including critical care time) DIAGNOSTIC STUDIES: Oxygen Saturation is 99% on RA,  normal by my interpretation.    COORDINATION OF CARE: 10:13 AM Discussed treatment plan which includes lab work, CXR, EKG, prednisone, and a breathing treatment with pt at bedside and pt agreed to plan.  Labs Review Labs Reviewed  CBC WITH DIFFERENTIAL/PLATELET - Abnormal; Notable for the following:    RBC 5.44 (*)    Hemoglobin 16.3 (*)    HCT 46.7 (*)    Neutro Abs 8.1 (*)    Lymphs Abs 0.6 (*)    All other components within normal limits  BASIC METABOLIC PANEL - Abnormal; Notable for the following:    Chloride 98 (*)    Glucose, Bld 111 (*)    Creatinine, Ser 1.15 (*)    Calcium 8.8 (*)    GFR calc non Af Amer 48 (*)    GFR calc Af Amer 56 (*)    All other components within normal limits  BRAIN NATRIURETIC PEPTIDE - Abnormal; Notable for the following:    B Natriuretic Peptide 133.0 (*)    All other components within normal limits  BLOOD GAS,  ARTERIAL - Abnormal; Notable for the following:    pCO2 arterial 55.7 (*)    pO2, Arterial 62.3 (*)    Bicarbonate 27.8 (*)    Acid-Base Excess 5.5 (*)    All other components within normal limits  GLUCOSE, CAPILLARY - Abnormal; Notable for the following:    Glucose-Capillary 182 (*)    All other components within normal limits  MRSA PCR SCREENING  TROPONIN I  D-DIMER, QUANTITATIVE (NOT AT [facility])  INFLUENZA PANEL BY PCR (TYPE A & B, H1N1)  I-STAT CG4 LACTIC ACID, ED  I-STAT CG4 LACTIC ACID, ED    Imaging Review Dg Chest Port 1 View  11/20/2015  CLINICAL DATA:  Shortness of breath for 1 week. EXAM: PORTABLE CHEST 1 VIEW COMPARISON:  None. FINDINGS: Cardiomegaly, pulmonary vascular congestion and interstitial opacities noted, likely representing interstitial edema. No definite pleural effusions or pneumothorax. No acute bony abnormalities are present. IMPRESSION: Pulmonary is scratch a  cardiomegaly, pulmonary vascular congestion and probable interstitial pulmonary edema. Electronically Signed   By: Margarette Canada M.D.   On: 11/20/2015 11:27   I have personally reviewed and evaluated these images and lab results as part of my medical decision-making.   EKG Interpretation   Date/Time:  Saturday November 20 2015 10:19:46 EST Ventricular Rate:  126 PR Interval:  123 QRS Duration: 97 QT Interval:  332 QTC Calculation: 481 R Axis:   81 Text Interpretation:  Sinus tachycardia LAE, consider biatrial enlargement  Borderline right axis deviation ST depr, consider ischemia, inferior leads  Baseline wander in lead(s) V2 V5 inferior ST depression No previous ECGs  available Confirmed by Wyvonnia Dusky  MD, Jessiah Wojnar ([id number]) on 11/20/2015 10:31:43  AM      MDM   Final diagnoses:  Acute respiratory failure with hypoxemia (HCC)  COPD exacerbation (HCC)   1 week of body aches, chills, nonproductive cough. No fever. Smoker with history of COPD. denies chest pain.  EKG sinus tachycardia with ST  depression. Poor air exchange with hypoxia on room air. nebulizers and steroids given.  ABG does not show any setting of CO2 retention. Patient remains tachypnea and tachycardic. D-dimer is negative. She is placed on BiPAP for work of breathing. Continue nebulizers and steroids. She denies any chest pain. X-ray shows interstitial edema and she'll be given a small dose of Lasix. Denies any history of heart failure.  Patient tolerating BiPAP well. Heart rate has improved but respirations remain in the 30s. She appears more comfortable but is moving good air. Additional nebulizer given. Denies chest pain. Continue bronchodilators and steroids. She was also given a small dose of Lasix for interstitial edema on her her chest x-ray but does not have a history of CHF.  Admission discussed with Dr. Dyann Kief.  CRITICAL CARE Performed by: Ezequiel Essex Total critical care time: 60 minutes Critical care time was exclusive of separately billable procedures and treating other patients. Critical care was necessary to treat or prevent imminent or life-threatening deterioration. Critical care was time spent personally by me on the following activities: development of treatment plan with patient and/or surrogate as well as nursing, discussions with consultants, evaluation of patient's response to treatment, examination of patient, obtaining history from patient or surrogate, ordering and performing treatments and interventions, ordering and review of laboratory studies, ordering and review of radiographic studies, pulse oximetry and re-evaluation of patient's condition.   I personally performed the services described in this documentation, which was scribed in my presence. The recorded information has been reviewed and is accurate.   Ezequiel Essex, MD 11/20/15 [phone]

## 2015-11-21 ENCOUNTER — Inpatient Hospital Stay (HOSPITAL_COMMUNITY): Payer: Commercial Managed Care - HMO

## 2015-11-21 DIAGNOSIS — J9601 Acute respiratory failure with hypoxia: Secondary | ICD-10-CM | POA: Diagnosis not present

## 2015-11-21 DIAGNOSIS — Z8249 Family history of ischemic heart disease and other diseases of the circulatory system: Secondary | ICD-10-CM | POA: Diagnosis not present

## 2015-11-21 DIAGNOSIS — K219 Gastro-esophageal reflux disease without esophagitis: Secondary | ICD-10-CM | POA: Diagnosis not present

## 2015-11-21 DIAGNOSIS — Z23 Encounter for immunization: Secondary | ICD-10-CM | POA: Diagnosis not present

## 2015-11-21 DIAGNOSIS — J441 Chronic obstructive pulmonary disease with (acute) exacerbation: Secondary | ICD-10-CM | POA: Diagnosis not present

## 2015-11-21 DIAGNOSIS — E1165 Type 2 diabetes mellitus with hyperglycemia: Secondary | ICD-10-CM | POA: Diagnosis not present

## 2015-11-21 DIAGNOSIS — Z833 Family history of diabetes mellitus: Secondary | ICD-10-CM | POA: Diagnosis not present

## 2015-11-21 DIAGNOSIS — F17210 Nicotine dependence, cigarettes, uncomplicated: Secondary | ICD-10-CM | POA: Diagnosis not present

## 2015-11-21 DIAGNOSIS — I5033 Acute on chronic diastolic (congestive) heart failure: Secondary | ICD-10-CM | POA: Diagnosis not present

## 2015-11-21 LAB — CBC
HCT: 44.1 % (ref 36.0–46.0)
HEMOGLOBIN: 15 g/dL (ref 12.0–15.0)
MCH: 29.2 pg (ref 26.0–34.0)
MCHC: 34 g/dL (ref 30.0–36.0)
MCV: 86 fL (ref 78.0–100.0)
Platelets: 147 10*3/uL — ABNORMAL LOW (ref 150–400)
RBC: 5.13 MIL/uL — AB (ref 3.87–5.11)
RDW: 14 % (ref 11.5–15.5)
WBC: 9.7 10*3/uL (ref 4.0–10.5)

## 2015-11-21 LAB — BASIC METABOLIC PANEL
ANION GAP: 9 (ref 5–15)
BUN: 30 mg/dL — ABNORMAL HIGH (ref 6–20)
CHLORIDE: 98 mmol/L — AB (ref 101–111)
CO2: 32 mmol/L (ref 22–32)
Calcium: 8.4 mg/dL — ABNORMAL LOW (ref 8.9–10.3)
Creatinine, Ser: 1.17 mg/dL — ABNORMAL HIGH (ref 0.44–1.00)
GFR calc non Af Amer: 47 mL/min — ABNORMAL LOW (ref >60)
GFR, EST AFRICAN AMERICAN: 55 mL/min — AB (ref >60)
Glucose, Bld: 158 mg/dL — ABNORMAL HIGH (ref 65–99)
POTASSIUM: 5 mmol/L (ref 3.5–5.1)
SODIUM: 139 mmol/L (ref 135–145)

## 2015-11-21 LAB — GLUCOSE, CAPILLARY
GLUCOSE-CAPILLARY: 206 mg/dL — AB (ref 65–99)
GLUCOSE-CAPILLARY: 102 mg/dL — AB (ref 65–99)
GLUCOSE-CAPILLARY: 254 mg/dL — AB (ref 65–99)
Glucose-Capillary: 133 mg/dL — ABNORMAL HIGH (ref 65–99)

## 2015-11-21 LAB — MRSA PCR SCREENING: MRSA by PCR: NEGATIVE

## 2015-11-21 MED ORDER — IPRATROPIUM-ALBUTEROL 0.5-2.5 (3) MG/3ML IN SOLN
3.0000 mL | Freq: Three times a day (TID) | RESPIRATORY_TRACT | Status: DC
  Administered 2015-11-22: 3 mL via RESPIRATORY_TRACT
  Filled 2015-11-21: qty 3

## 2015-11-21 MED ORDER — FUROSEMIDE 10 MG/ML IJ SOLN
20.0000 mg | Freq: Every day | INTRAMUSCULAR | Status: DC
  Administered 2015-11-22: 20 mg via INTRAVENOUS
  Filled 2015-11-21: qty 2

## 2015-11-21 NOTE — Progress Notes (Signed)
*  PRELIMINARY RESULTS* Echocardiogram 2D Echocardiogram has been performed.  Leavy Cella 11/21/2015, 10:06 AM

## 2015-11-21 NOTE — Progress Notes (Signed)
[facility] PROGRESS NOTE  Erica [name] [id number] DOB: 08-08-1951 DOA: 11/20/2015 PCP: Maggie Font, MD  Assessment/Plan: 1-Acute respiratory failure with hypoxia [facility]): appears to be secondary to bronchitis and COPD exacerbation. Some vascular congestion and interstitial edema on CXR, no prior hx of CHF. -patient required BIPAP on admission for resp failure; now on nasal cannula supplementation (3-4 L with O2 sat of 90-93%) -Patient will remain in stepdown -Will continue treatment with steroids, nebulizer treatment, antibiotics, flutter valve and mucinex -We'll continue IV Lasix, but dose has been adjusted -Will follow 2-D echo (currently pending, but has already been done) -Continue modified carb and low sodium diet to be given once off BIPAP -Continue daily weights, strict intake and output -neg troponin and negative influenza by PCR -Will follow clinical response.  2-COPD exacerbation [facility]): -treatment as mentioned above -patient still smoking; but after cessation counseling motivated and looking to quit. -nicotine patch ordered  3-Benign essential HTN: -BP overall stable -will use lasix -Continue holding losartan and HCTZ for now -follow VS and adjust antihypertensives as needed   4-GERD (gastroesophageal reflux disease) -continue PPI  5-Type 2 diabetes mellitus with hyperglycemia ([place]): -will hold oral agents while inpatient -will continue use of SSI -follow A1C  6-chronic pain syndrome: Affecting her Lower back  -continue home analgesic regimen (Opana) -PRN morphine for breakthrough/uncontrolled pain    Code Status: Full code Family Communication: Daughter at bedside Disposition Plan: Since the patient has remained tachypneic with minimal exertion and her oxygen saturation are still borderline, will keep in the stepdown unit for another 24 hours to guarantee stability. Continue nebulizer treatment, antibiotics, oxygen supplementation, follow 2-D  echo.   Consultants:  None  Procedures:  See below for x-ray reports  2-D echo: Pending  Antibiotics:  Doxycycline 2/25  HPI/Subjective: Overall feeling better. Currently afebrile. He still short of breath and bili tachypnea with minimal exertion. Unable to speak in full sentences.  Objective: Filed Vitals:   11/21/15 0900 11/21/15 1206  BP: 118/61   Pulse: 89   Temp:  98.4 F (36.9 C)  Resp: 30     Intake/Output Summary (Last 24 hours) at 11/21/15 1236 Last data filed at 11/20/15 1900  Gross per 24 hour  Intake      0 ml  Output    675 ml  Net   -675 ml   Filed Weights   11/20/15 0951 11/21/15 0500  Weight: 74.844 kg (165 lb) 72.7 kg (160 lb 4.4 oz)    Exam:   General:  Patient is currently afebrile, feeling better overall but is still short of breath. Tachypneic with minimal exertion. Denies chest pain, nausea, vomiting, dysuria, headaches and any other complaints.  Cardiovascular: S1 and S2, no rubs, no gallops  Respiratory: Coarse breath sounds, positive expiratory wheezing, improved air movement in comparison to physical exam on admission. Positive rhonchi.  Abdomen: Soft, nontender, nondistended, positive bowel sounds  Musculoskeletal: No edema, no cyanosis  Data Reviewed: Basic Metabolic Panel:  Recent Labs Lab 11/20/15 1024 11/21/15 0518  NA 137 139  K 4.3 5.0  CL 98* 98*  CO2 32 32  GLUCOSE 111* 158*  BUN 20 30*  CREATININE 1.15* 1.17*  CALCIUM 8.8* 8.4*  MG 1.8  --   PHOS 3.2  --    Liver Function Tests:  Recent Labs Lab 11/20/15 1024  AST 18  ALT 13*  ALKPHOS 69  BILITOT 0.4  PROT 6.9  ALBUMIN 3.8   CBC:  Recent Labs Lab 11/20/15 1024 11/21/15  0518  WBC 9.5 9.7  NEUTROABS 8.1*  --   HGB 16.3* 15.0  HCT 46.7* 44.1  MCV 85.8 86.0  PLT 157 147*   Cardiac Enzymes:  Recent Labs Lab 11/20/15 1024  TROPONINI <0.03   BNP (last 3 results)  Recent Labs  11/20/15 1024  BNP 133.0*   CBG:  Recent Labs Lab  11/20/15 1509 11/20/15 2147 11/21/15 0727 11/21/15 1114  GLUCAP 182* 221* 133* 206*    Recent Results (from the past 240 hour(s))  MRSA PCR Screening     Status: None   Collection Time: 11/21/15  3:12 AM  Result Value Ref Range Status   MRSA by PCR NEGATIVE NEGATIVE Final    Comment:        The GeneXpert MRSA Assay (FDA approved for NASAL specimens only), is one component of a comprehensive MRSA colonization surveillance program. It is not intended to diagnose MRSA infection nor to guide or monitor treatment for MRSA infections.      Studies: Dg Chest Port 1 View  11/20/2015  CLINICAL DATA:  Shortness of breath for 1 week. EXAM: PORTABLE CHEST 1 VIEW COMPARISON:  None. FINDINGS: Cardiomegaly, pulmonary vascular congestion and interstitial opacities noted, likely representing interstitial edema. No definite pleural effusions or pneumothorax. No acute bony abnormalities are present. IMPRESSION: Pulmonary is scratch a cardiomegaly, pulmonary vascular congestion and probable interstitial pulmonary edema. Electronically Signed   By: Margarette Canada M.D.   On: 11/20/2015 11:27    Scheduled Meds: . antiseptic oral rinse  7 mL Mouth Rinse q12n4p  . aspirin EC  81 mg Oral Daily  . budesonide (PULMICORT) nebulizer solution  0.25 mg Nebulization BID  . chlorhexidine  15 mL Mouth Rinse BID  . doxycycline  100 mg Oral Q12H  . furosemide  40 mg Intravenous Daily  . guaiFENesin  600 mg Oral BID  . heparin  5,000 Units Subcutaneous 3 times per day  . insulin aspart  0-15 Units Subcutaneous TID WC  . insulin aspart  0-5 Units Subcutaneous QHS  . ipratropium-albuterol  3 mL Nebulization Q6H  . methylPREDNISolone (SOLU-MEDROL) injection  80 mg Intravenous 3 times per day  . Oxymorphone ER (Crush Resist)  1 tablet Oral Daily  . pantoprazole  40 mg Oral Daily  . sodium chloride flush  3 mL Intravenous Q12H   Continuous Infusions:   Principal Problem:   Acute respiratory failure with hypoxia  (HCC) Active Problems:   COPD exacerbation (HCC)   Benign essential HTN   GERD (gastroesophageal reflux disease)   Tobacco abuse   Type 2 diabetes mellitus with hyperglycemia ([place])    Time spent: 35 minutes    Barton Dubois  [facility] Pager [phone]. If 7PM-7AM, please contact night-coverage at [email or web address], password [facility] 11/21/2015, 12:36 PM  LOS: 1 day

## 2015-11-22 ENCOUNTER — Inpatient Hospital Stay (HOSPITAL_COMMUNITY): Payer: Commercial Managed Care - HMO

## 2015-11-22 LAB — GLUCOSE, CAPILLARY
GLUCOSE-CAPILLARY: 186 mg/dL — AB (ref 65–99)
GLUCOSE-CAPILLARY: 110 mg/dL — AB (ref 65–99)
Glucose-Capillary: 147 mg/dL — ABNORMAL HIGH (ref 65–99)
Glucose-Capillary: 226 mg/dL — ABNORMAL HIGH (ref 65–99)

## 2015-11-22 LAB — HEMOGLOBIN A1C
HEMOGLOBIN A1C: 5.9 % — AB (ref 4.8–5.6)
MEAN PLASMA GLUCOSE: 123 mg/dL

## 2015-11-22 MED ORDER — FUROSEMIDE 20 MG PO TABS
20.0000 mg | ORAL_TABLET | Freq: Every day | ORAL | Status: DC
  Administered 2015-11-23: 20 mg via ORAL
  Filled 2015-11-22: qty 1

## 2015-11-22 MED ORDER — IPRATROPIUM-ALBUTEROL 0.5-2.5 (3) MG/3ML IN SOLN
3.0000 mL | Freq: Two times a day (BID) | RESPIRATORY_TRACT | Status: DC
  Administered 2015-11-22 – 2015-11-23 (×2): 3 mL via RESPIRATORY_TRACT
  Filled 2015-11-22 (×2): qty 3

## 2015-11-22 NOTE — Progress Notes (Signed)
SATURATION QUALIFICATIONS: (This note is used to comply with regulatory documentation for home oxygen)  Patient Saturations on Room Air at Rest 91%  Patient Saturations on Room Air while Ambulating 79%  Patient Saturations on 3 Liters of oxygen while Ambulating 86%  Please briefly explain why patient needs home oxygen:

## 2015-11-22 NOTE — Progress Notes (Signed)
PT Cancellation Note  Patient Details Name: Faatima Peikert MRN: [id number] DOB: Dec 08, 1950   Cancelled Treatment:    Reason Eval/Treat Not Completed: PT screened, no needs identified, will sign off. Chart reviewed, consulted nursing. Pt has been up, ambulating the unit with NA, with mild desaturation, but otherwise strength, balance, and mobility near baseline. No skilled PT services needed at this time. Recommending to continue AMB with nursing daily. Please reorder PT as needed. Signing off.     5:00 PM, 11/22/2015 Etta Grandchild, PT, DPT PRN [facility]rap[facility] at [place] License # [id number] (wireless)  [phone] (mobile)

## 2015-11-22 NOTE — Progress Notes (Signed)
Nutrition Brief Note  Patient identified on the Malnutrition Screening Tool (MST) Report  Pt has hx of DM type 2, HTN and COPD. Complaining of shortness of breath on admission.  Wt Readings from Last 15 Encounters:  11/22/15 159 lb 9.8 oz (72.4 kg)    Body mass index is 27.38 kg/(m^2). Patient meets criteria for overweight based on current BMI. Pt denies any changes in weight or appetite.  Current diet order is [place] Healthy/CHO Modified patient is consuming approximately 100% of meals at this time. She is complaining that she can't have sausage, bacon or a salt packet.  She is eating very well despite these limitations.   Labs and medications reviewed. Pt checks her blood glucose every morning and reports its usually between (117-127).   No nutrition interventions warranted at this time. If nutrition issues arise, please consult RD.   Colman Cater MS,RD,CSG,LDN Office: [phone] Pager: [phone]

## 2015-11-22 NOTE — Progress Notes (Signed)
Inpatient Diabetes Program Recommendations  AACE/ADA: New Consensus Statement on Inpatient Glycemic Control (2015)  Target Ranges:  Prepandial:   less than 140 mg/dL      Peak postprandial:   less than 180 mg/dL (1-2 hours)      Critically ill patients:  140 - 180 mg/dL  Results for ELLIOTT, DILAURO (MRN [id number]) as of 11/22/2015 09:29  Ref. Range 11/21/2015 07:27 11/21/2015 11:14 11/21/2015 16:29 11/21/2015 21:28 11/22/2015 07:46  Glucose-Capillary Latest Ref Range: 65-99 mg/dL 133 (H) 206 (H) 102 (H) 254 (H) 147 (H)   Review of Glycemic Control  Diabetes history: DM2 Outpatient Diabetes medications: Onglyza 5 mg daily Current orders for Inpatient glycemic control: Novolog 0-15 units TID with meals, Novolog 0-5 units QHS   Inpatient Diabetes Program Recommendations: Insulin - Meal Coverage: If steroids are continued as ordered (Solumedrol 80 mg TID), please consider ordering Novolog 4 units TID with meals for meal coverage.  Thanks, Barnie Alderman, RN, MSN, CDE Diabetes Coordinator Inpatient Diabetes Program [phone] (Team Pager from [place] to [place]) [phone] ([facility] office) [phone] [facility] office) [phone] [facility] office)

## 2015-11-22 NOTE — Progress Notes (Signed)
[facility] PROGRESS NOTE  Erica [name] [id number] DOB: 1951-09-22 DOA: 11/20/2015 PCP: Maggie Font, MD  Assessment/Plan: 1-Acute respiratory failure with hypoxia [facility]): appears to be secondary to bronchitis and COPD exacerbation. Some vascular congestion and interstitial edema on CXR, no prior hx of CHF. -patient required BIPAP on admission for resp failure; now on nasal cannula supplementation (3-4 L with O2 sat of 90-95%; but desaturating on exertion) -Patient will Will be transferred to telemetry bed. -Will continue treatment with steroids, nebulizer treatment, antibiotics, flutter valve and mucinex. Patient is slowly making good progress. -Will continue Lasix, but will transition to by mouth regimen.  -2-D echo (with preserved ejection fraction, no wall motion abnormalities and confirming diastolic heart failure) -Continue modified carb and low sodium diet to be given once off BIPAP -Continue daily weights, strict intake and output -neg troponin and negative influenza by PCR -Will follow clinical response.  2-COPD exacerbation [facility]): -treatment as mentioned above -patient still smoking; but after cessation counseling motivated and looking to quit. -nicotine patch ordered  3-Benign essential HTN: -BP overall stable -will use lasix and resume the rest of her medications slowly. -follow VS and adjust antihypertensives as needed   4-GERD (gastroesophageal reflux disease) -continue PPI  5-Type 2 diabetes mellitus with hyperglycemia ([place]): -will hold oral agents while inpatient -will continue use of SSI while inpatient -A1C 5.9 -CBGs slightly elevated secondary to steroids use.  6-chronic pain syndrome: Affecting her Lower back  -continue home analgesic regimen (Opana) -PRN morphine for breakthrough/uncontrolled pain    Code Status: Full code Family Communication: Daughter at bedside Disposition Plan: Since the patient has remained tachypneic with minimal  exertion and her oxygen saturation are still borderline, will keep in the stepdown unit for another 24 hours to guarantee stability. Continue nebulizer treatment, antibiotics, oxygen supplementation, follow 2-D echo.   Consultants:  None  Procedures:  See below for x-ray reports  2-D echo: Preserved ejection fraction, no wall motion abnormalities; findings revealing diastolic heart failure.  Antibiotics:  Doxycycline 2/25  HPI/Subjective: Overall feeling better. Patient is afebrile and with improved air movement on physical exam. Still wheezing, tachypnea take with exertion; ongoing desaturation despite oxygen supplementation on exertion and mild difficulty speaking in full sentences. Has no longer require BiPAP in the last 48 hours.  Objective: Filed Vitals:   11/22/15 1200 11/22/15 1300  BP:    Pulse: 90 90  Temp:    Resp: 27 21    Intake/Output Summary (Last 24 hours) at 11/22/15 1646 Last data filed at 11/22/15 1018  Gross per 24 hour  Intake    480 ml  Output    400 ml  Net     80 ml   Filed Weights   11/20/15 0951 11/21/15 0500 11/22/15 0500  Weight: 74.844 kg (165 lb) 72.7 kg (160 lb 4.4 oz) 72.4 kg (159 lb 9.8 oz)    Exam:   General:  Patient is currently afebrile, feeling better overall but is still short of breath, requiring oxygen supplementation and with desaturation on exertion. Tachypneic with activity, even improved and wheezy.  Cardiovascular: S1 and S2, no rubs, no gallops  Respiratory: Coarse breath sounds, positive expiratory wheezing, moving more air. positive rhonchi .  Abdomen: Soft, nontender, nondistended, positive bowel sounds  Musculoskeletal: No edema, no cyanosis  Data Reviewed: Basic Metabolic Panel:  Recent Labs Lab 11/20/15 1024 11/21/15 0518  NA 137 139  K 4.3 5.0  CL 98* 98*  CO2 32 32  GLUCOSE 111* 158*  BUN  20 30*  CREATININE 1.15* 1.17*  CALCIUM 8.8* 8.4*  MG 1.8  --   PHOS 3.2  --    Liver Function  Tests:  Recent Labs Lab 11/20/15 1024  AST 18  ALT 13*  ALKPHOS 69  BILITOT 0.4  PROT 6.9  ALBUMIN 3.8   CBC:  Recent Labs Lab 11/20/15 1024 11/21/15 0518  WBC 9.5 9.7  NEUTROABS 8.1*  --   HGB 16.3* 15.0  HCT 46.7* 44.1  MCV 85.8 86.0  PLT 157 147*   Cardiac Enzymes:  Recent Labs Lab 11/20/15 1024  TROPONINI <0.03   BNP (last 3 results)  Recent Labs  11/20/15 1024  BNP 133.0*   CBG:  Recent Labs Lab 11/21/15 1114 11/21/15 1629 11/21/15 2128 11/22/15 0746 11/22/15 1159  GLUCAP 206* 102* 254* 147* 186*    Recent Results (from the past 240 hour(s))  MRSA PCR Screening     Status: None   Collection Time: 11/21/15  3:12 AM  Result Value Ref Range Status   MRSA by PCR NEGATIVE NEGATIVE Final    Comment:        The GeneXpert MRSA Assay (FDA approved for NASAL specimens only), is one component of a comprehensive MRSA colonization surveillance program. It is not intended to diagnose MRSA infection nor to guide or monitor treatment for MRSA infections.      Studies: Dg Chest 2 View  11/22/2015  CLINICAL DATA:  Shortness of breath, hit history of bronchitis, COPD EXAM: CHEST  2 VIEW COMPARISON:  11/20/2015 FINDINGS: Cardiomegaly again noted. Mild hyperinflation. Persistent bilateral reticular interstitial prominence highly suspicious for interstitial edema or pneumonitis. No pleural effusion. No pneumothorax. IMPRESSION: Hyperinflation again noted. Cardiomegaly. Persistent bilateral reticular interstitial prominence highly suspicious for interstitial edema or pneumonitis. Electronically Signed   By: Lahoma Crocker M.D.   On: 11/22/2015 14:24    Scheduled Meds: . antiseptic oral rinse  7 mL Mouth Rinse q12n4p  . aspirin EC  81 mg Oral Daily  . budesonide (PULMICORT) nebulizer solution  0.25 mg Nebulization BID  . chlorhexidine  15 mL Mouth Rinse BID  . doxycycline  100 mg Oral Q12H  . furosemide  20 mg Oral Daily  . guaiFENesin  600 mg Oral BID  .  heparin  5,000 Units Subcutaneous 3 times per day  . insulin aspart  0-15 Units Subcutaneous TID WC  . insulin aspart  0-5 Units Subcutaneous QHS  . ipratropium-albuterol  3 mL Nebulization BID  . methylPREDNISolone (SOLU-MEDROL) injection  80 mg Intravenous 3 times per day  . pantoprazole  40 mg Oral Daily  . sodium chloride flush  3 mL Intravenous Q12H   Continuous Infusions:   Principal Problem:   Acute respiratory failure with hypoxia (HCC) Active Problems:   COPD exacerbation (HCC)   Benign essential HTN   GERD (gastroesophageal reflux disease)   Tobacco abuse   Type 2 diabetes mellitus with hyperglycemia ([place])    Time spent: 35 minutes    Barton Dubois  [facility] Pager [phone]. If 7PM-7AM, please contact night-coverage at [email or web address], password [facility] 11/22/2015, 4:46 PM  LOS: 2 days

## 2015-11-23 LAB — GLUCOSE, CAPILLARY
GLUCOSE-CAPILLARY: 174 mg/dL — AB (ref 65–99)
Glucose-Capillary: 146 mg/dL — ABNORMAL HIGH (ref 65–99)

## 2015-11-23 MED ORDER — BUDESONIDE-FORMOTEROL FUMARATE 160-4.5 MCG/ACT IN AERO: 2.0000 | INHALATION_SPRAY | Freq: Two times a day (BID) | RESPIRATORY_TRACT | Status: DC

## 2015-11-23 MED ORDER — GUAIFENESIN ER 600 MG PO TB12: 600.0000 mg | ORAL_TABLET | Freq: Two times a day (BID) | ORAL | Status: DC

## 2015-11-23 MED ORDER — IPRATROPIUM-ALBUTEROL 18-103 MCG/ACT IN AERO: 1.0000 | INHALATION_SPRAY | Freq: Four times a day (QID) | RESPIRATORY_TRACT | Status: DC | PRN

## 2015-11-23 MED ORDER — DOXYCYCLINE HYCLATE 100 MG PO TABS: 100.0000 mg | ORAL_TABLET | Freq: Two times a day (BID) | ORAL | Status: AC

## 2015-11-23 MED ORDER — PREDNISONE 20 MG PO TABS: ORAL_TABLET | ORAL | Status: DC

## 2015-11-23 NOTE — Discharge Summary (Signed)
Expand All Collapse All   Physician Discharge Summary  Erica [name] [id number] DOB: 09-Nov-1950 DOA: 11/20/2015  PCP: Maggie Font, MD  Admit date: 11/20/2015 Discharge date: 11/23/2015  Time spent: 40 minutes  Recommendations for Outpatient Follow-up:  1. Repeat basic metabolic panel to follow electrolytes and renal function 2. Please reassess patient's volume status and adjust her diuretic regimen further as needed 3. Reassess needs for oxygen supplementation 4. Arrange referral to pulmonary service for PFTs and further adjustment on maintenance therapy for her COPD   Discharge Diagnoses:  Principal Problem:  Acute respiratory failure with hypoxia ([place]) Active Problems:  COPD exacerbation (HCC)  Benign essential HTN  GERD (gastroesophageal reflux disease)  Tobacco abuse  Type 2 diabetes mellitus with hyperglycemia ([place])   Discharge Condition: Stable and improved. Still with mild wheezing and desaturation off oxygen supplementation at the moment of discharge. No chest pain, no fever, significant improvement in the air movement and the patient asking to go home. Instructions provided as mentioned below for prednisone tapering, continue antibiotics, rescue inhalers and the use of Pulmicort twice a day. Patient will resume the use of her S per year and has been instructed to stop smoking. Follow-up with PCP in one week.  Diet recommendation: Modify carbohydrates and heart healthy/low sodium diet  Filed Weights   11/21/15 0500 11/22/15 0500 11/23/15 0500  Weight: 72.7 kg (160 lb 4.4 oz) 72.4 kg (159 lb 9.8 oz) 71.7 kg (158 lb 1.1 oz)    History of present illness:  65 y.o. female with PMH significant for HTN, type 2 diabetes, ongoing tobacco abuse and COPD; who came to ED complaining of 4-5 days worsening SOB, productive cough, myalgias and chills/sweating. Patient reports has failed to improved with use of nebulizer and inhalers at home. In fact they have  continue worsening and are now associated with HA's and general malaise. She denies CP, objective fever, nausea, vomiting, abd pain, dysuria, or any other complaints. In Ed she was found to be in acute resp distress, hypoxic on RA, tachypneic, tachycardic and with diffuse wheezing. Patient escalated to require BIPAP due to resp failure. CXR with bronchitic changes, vascular congestion and interstitial edema. [facility] called to admit patient for further evaluation and treatment.  Hospital Course:  1-Acute respiratory failure with hypoxia ([place]): appears to be secondary to bronchitis and COPD exacerbation. Some vascular congestion and interstitial edema on CXR also appreciated. -patient required BIPAP on admission for resp failure; now on nasal cannula supplementation (3 with O2 sat of 90-94%; but still with mild desaturation on exertion) -Patient will be discharge with oxygen supplementation, prednisone tapering instructions, doxycycline BID, Combivent as a rescue inhaler and Symbicort twice a day for maintenance. -She will benefit of outpatient referral to a pulmonary service for PFTs and further adjustment to her maintenance treatment. -Negative troponin and negative influenza by PCR. -Will follow up with PCP in one week  2-COPD exacerbation [facility]): -treatment as mentioned above -patient still smoking; but after cessation counseling motivated and looking to quit. -nicotine patch over-the-counter recommended  3-Benign essential HTN: -BP overall stable -will will resume home medication regimen -Advised to follow a heart healthy diet  4-GERD (gastroesophageal reflux disease) -continue PPI  5-Type 2 diabetes mellitus with hyperglycemia ([place]): -will resume oral hypoglycemic regimen -Patient advised to follow a modified carbohydrate diet -A1C 5.9; demonstrating a stable condition -CBGs slightly elevated during hospitalization secondary to steroids. Anticipation of her to get back to baseline once  completely tapered off prednisone.  6-chronic pain syndrome:  Affecting her Lower back  -continue home analgesic regimen (Opana)  7-mild acute on chronic diastolic heart failure: Preserved ejection fraction and no wall motion abnormalities on 2-D echo -Patient advised to follow a low-sodium diet, to check her weight on daily basis and to maintain adequate hydration -She will continue ACT C and treatment with current antihypertensive medications. Follow-up with PCP 1 week; will be important to reassess patient believed status and if needed adjust long-term therapy with diuretics regimen.  Procedures:  See below for x-ray reports  2-D echo: Preserved ejection fraction, no wall motion abnormalities; findings revealing diastolic heart failure.   Consultations:  None  Discharge Exam: Filed Vitals:   11/23/15 0700 11/23/15 0800  BP: 132/60 119/68  Pulse: 79 90  Temp:    Resp: 27 18    General: Patient is currently afebrile, feeling much better and even she is still slightly short of breath, is a stable for discharge. Patient has required oxygen supplementation especially on exertion to keep oxygen saturation above 90%. Betty mild wheezing on expiration and minimal tachypnea. She wants to go home.  Cardiovascular: S1 and S2, no rubs, no gallops  Respiratory: Positive rhonchi; positive mild expiratory wheezing, improvement in her air movement. No use of accessory muscles and able to speak in full sentences now.   Abdomen: Soft, nontender, nondistended, positive bowel sounds  Musculoskeletal: No edema, no cyanosis   Discharge Instructions   Discharge Instructions    Diet - low sodium heart healthy  Complete by: As directed      Discharge instructions  Complete by: As directed   Arrange follow up with PCP in 1 week Take medications as prescribed Please stop smoking Follow low sodium diet (less than 2 gram sodium daily) Check weight on  daily basis  Use oxygen 24/7 as instructed     Increase activity slowly  Complete by: As directed           Current Discharge Medication List    START taking these medications   Details  albuterol-ipratropium (COMBIVENT) 18-103 MCG/ACT inhaler Inhale 1 puff into the lungs every 6 (six) hours as needed for wheezing or shortness of breath. Qty: 1 Inhaler, Refills: 2    budesonide-formoterol (SYMBICORT) 160-4.5 MCG/ACT inhaler Inhale 2 puffs into the lungs 2 (two) times daily. Qty: 1 Inhaler, Refills: 3    doxycycline (VIBRA-TABS) 100 MG tablet Take 1 tablet (100 mg total) by mouth every 12 (twelve) hours. Qty: 12 tablet, Refills: 0    guaiFENesin (MUCINEX) 600 MG 12 hr tablet Take 1 tablet (600 mg total) by mouth 2 (two) times daily. Qty: 40 tablet, Refills: 0      CONTINUE these medications which have CHANGED   Details  predniSONE (DELTASONE) 20 MG tablet Take 3 tabs daily for 2 days, then 2 tabs daily x 3 days, then 1 tab daily for 3 days; then 1/2 tablet for 3 days and stop prednisone Qty: 18 tablet, Refills: 0      CONTINUE these medications which have NOT CHANGED   Details  albuterol (PROVENTIL) (2.5 MG/3ML) 0.083% nebulizer solution Take 2.5 mg by nebulization every 6 (six) hours as needed for wheezing or shortness of breath.     aspirin 81 MG tablet Take 1 tablet by mouth daily.    famotidine (PEPCID) 20 MG tablet Take 1 tablet by mouth 2 (two) times daily.    lidocaine (XYLOCAINE) 5 % ointment Apply 1 application topically 2 (two) times daily. Refills: 0    lidocaine-prilocaine (EMLA)  cream Apply 1 application topically daily as needed (port use).     OPANA ER, CRUSH RESISTANT, 7.5 MG T12A Take 1 tablet by mouth daily.    pantoprazole (PROTONIX) 40 MG tablet Take 1 tablet by mouth daily.    saxagliptin HCl (ONGLYZA) 5 MG TABS tablet Take 5 mg by mouth daily.    SPIRIVA HANDIHALER 18 MCG  inhalation capsule Place 1 capsule into inhaler and inhale daily.    valsartan-hydrochlorothiazide (DIOVAN-HCT) 160-12.5 MG tablet Take 1 tablet by mouth daily.       No Known Allergies Follow-up Information    Follow up with Maggie Font, MD. Schedule an appointment as soon as possible for a visit in 1 week.   Specialty: Family Medicine   Contact information:   [address] [place] [address] [phone]        The results of significant diagnostics from this hospitalization (including imaging, microbiology, ancillary and laboratory) are listed below for reference.    Significant Diagnostic Studies:  Imaging Results    Dg Chest 2 View  11/22/2015 CLINICAL DATA: Shortness of breath, hit history of bronchitis, COPD EXAM: CHEST 2 VIEW COMPARISON: 11/20/2015 FINDINGS: Cardiomegaly again noted. Mild hyperinflation. Persistent bilateral reticular interstitial prominence highly suspicious for interstitial edema or pneumonitis. No pleural effusion. No pneumothorax. IMPRESSION: Hyperinflation again noted. Cardiomegaly. Persistent bilateral reticular interstitial prominence highly suspicious for interstitial edema or pneumonitis. Electronically Signed By: Lahoma Crocker M.D. On: 11/22/2015 14:24   Dg Chest Port 1 View  11/20/2015 CLINICAL DATA: Shortness of breath for 1 week. EXAM: PORTABLE CHEST 1 VIEW COMPARISON: None. FINDINGS: Cardiomegaly, pulmonary vascular congestion and interstitial opacities noted, likely representing interstitial edema. No definite pleural effusions or pneumothorax. No acute bony abnormalities are present. IMPRESSION: Pulmonary is scratch a cardiomegaly, pulmonary vascular congestion and probable interstitial pulmonary edema. Electronically Signed By: Margarette Canada M.D. On: 11/20/2015 11:27     Microbiology: Recent Results (from the past 240 hour(s))  MRSA PCR Screening Status: None   Collection Time: 11/21/15 3:12  AM  Result Value Ref Range Status   MRSA by PCR NEGATIVE NEGATIVE Final    Comment:   The GeneXpert MRSA Assay (FDA approved for NASAL specimens only), is one component of a comprehensive MRSA colonization surveillance program. It is not intended to diagnose MRSA infection nor to guide or monitor treatment for MRSA infections.      Labs: Basic Metabolic Panel:  Last Labs      Recent Labs Lab 11/20/15 1024 11/21/15 0518  NA 137 139  K 4.3 5.0  CL 98* 98*  CO2 32 32  GLUCOSE 111* 158*  BUN 20 30*  CREATININE 1.15* 1.17*  CALCIUM 8.8* 8.4*  MG 1.8 --   PHOS 3.2 --      Liver Function Tests:  Last Labs      Recent Labs Lab 11/20/15 1024  AST 18  ALT 13*  ALKPHOS 69  BILITOT 0.4  PROT 6.9  ALBUMIN 3.8     CBC:  Last Labs      Recent Labs Lab 11/20/15 1024 11/21/15 0518  WBC 9.5 9.7  NEUTROABS 8.1* --   HGB 16.3* 15.0  HCT 46.7* 44.1  MCV 85.8 86.0  PLT 157 147*     Cardiac Enzymes:  Last Labs      Recent Labs Lab 11/20/15 1024  TROPONINI <0.03     BNP: BNP (last 3 results)  Recent Labs (within last 365 days)     Recent  Labs  11/20/15 1024  BNP 133.0*      CBG:  Last Labs      Recent Labs Lab 11/22/15 1159 11/22/15 1616 11/22/15 2121 11/23/15 0733 11/23/15 1141  GLUCAP 186* 110* 226* 146* 174*      Signed:  Barton Dubois MD.  [facility] 11/23/2015, 12:04 PM          Routing History     Date/Time From To Method   11/23/2015 12:24 PM Barton Dubois, MD Iona Beard, MD Fax

## 2015-11-23 NOTE — Care Management Important Message (Signed)
Important Message  Patient Details  Name: Erica [name] MRN: [id number] Date of Birth: 01-13-1951   Medicare Important Message Given:  Yes    Sherald Barge, RN 11/23/2015, 4:09 PM

## 2015-11-23 NOTE — Progress Notes (Signed)
Physician Discharge Summary  Erica [name] [id number] DOB: 24-Sep-1951 DOA: 11/20/2015  PCP: Maggie Font, MD  Admit date: 11/20/2015 Discharge date: 11/23/2015  Time spent: 40 minutes  Recommendations for Outpatient Follow-up:  1. Repeat basic metabolic panel to follow electrolytes and renal function 2. Please reassess patient's volume status and adjust her diuretic regimen further as needed 3. Reassess needs for oxygen supplementation 4. Arrange referral to pulmonary service for PFTs and further adjustment on maintenance therapy for her COPD   Discharge Diagnoses:  Principal Problem:   Acute respiratory failure with hypoxia ([place]) Active Problems:   COPD exacerbation (HCC)   Benign essential HTN   GERD (gastroesophageal reflux disease)   Tobacco abuse   Type 2 diabetes mellitus with hyperglycemia ([place])   Discharge Condition: Stable and improved. Still with mild wheezing and desaturation off oxygen supplementation at the moment of discharge. No chest pain, no fever, significant improvement in the air movement and the patient asking to go home. Instructions provided as mentioned below for prednisone tapering, continue antibiotics, rescue inhalers and the use of Pulmicort twice a day. Patient will resume the use of her S per year and has been instructed to stop smoking. Follow-up with PCP in one week.  Diet recommendation: Modify carbohydrates and heart healthy/low sodium diet  Filed Weights   11/21/15 0500 11/22/15 0500 11/23/15 0500  Weight: 72.7 kg (160 lb 4.4 oz) 72.4 kg (159 lb 9.8 oz) 71.7 kg (158 lb 1.1 oz)    History of present illness:  65 y.o. female with PMH significant for HTN, type 2 diabetes, ongoing tobacco abuse and COPD; who came to ED complaining of 4-5 days worsening SOB, productive cough, myalgias and chills/sweating. Patient reports has failed to improved with use of nebulizer and inhalers at home. In fact they have continue worsening and are now associated  with HA's and general malaise. She denies CP, objective fever, nausea, vomiting, abd pain, dysuria, or any other complaints. In Ed she was found to be in acute resp distress, hypoxic on RA, tachypneic, tachycardic and with diffuse wheezing. Patient escalated to require BIPAP due to resp failure. CXR with bronchitic changes, vascular congestion and interstitial edema. TRH called to admit patient for further evaluation and treatment.  Hospital Course:  1-Acute respiratory failure with hypoxia ([place]): appears to be secondary to bronchitis and COPD exacerbation. Some vascular congestion and interstitial edema on CXR also appreciated. -patient required BIPAP on admission for resp failure; now on nasal cannula supplementation (3 with O2 sat of 90-94%; but still with mild desaturation on exertion) -Patient will be discharge with oxygen supplementation, prednisone tapering instructions, doxycycline BID, Combivent as a rescue inhaler and Symbicort twice a day for maintenance. -She will benefit of outpatient referral to a pulmonary service for PFTs and further adjustment to her maintenance treatment. -Negative troponin and negative influenza by PCR. -Will follow up with PCP in one week  2-COPD exacerbation [facility]): -treatment as mentioned above -patient still smoking; but after cessation counseling motivated and looking to quit. -nicotine patch over-the-counter recommended  3-Benign essential HTN: -BP overall stable -will will resume home medication regimen -Advised to follow a heart healthy diet  4-GERD (gastroesophageal reflux disease) -continue PPI  5-Type 2 diabetes mellitus with hyperglycemia ([place]): -will resume oral hypoglycemic regimen -Patient advised to follow a modified carbohydrate diet -A1C 5.9; demonstrating a stable condition -CBGs slightly elevated during hospitalization secondary to steroids. Anticipation of her to get back to baseline once completely tapered off  prednisone.  6-chronic pain syndrome:  Affecting her Lower back  -continue home analgesic regimen (Opana)  7-mild acute on chronic diastolic heart failure: Preserved ejection fraction and no wall motion abnormalities on 2-D echo -Patient advised to follow a low-sodium diet, to check her weight on daily basis and to maintain adequate hydration -She will continue ACT C and treatment with current antihypertensive medications. Follow-up with PCP 1 week; will be important to reassess patient believed status and if needed adjust long-term therapy with diuretics regimen.  Procedures:  See below for x-ray reports  2-D echo: Preserved ejection fraction, no wall motion abnormalities; findings revealing diastolic heart failure.   Consultations:  None  Discharge Exam: Filed Vitals:   11/23/15 0700 11/23/15 0800  BP: 132/60 119/68  Pulse: 79 90  Temp:    Resp: 27 18    General: Patient is currently afebrile, feeling much better and even she is still slightly short of breath, is a stable for discharge. Patient has required oxygen supplementation especially on exertion to keep oxygen saturation above 90%. Betty mild wheezing on expiration and minimal tachypnea. She wants to go home.  Cardiovascular: S1 and S2, no rubs, no gallops  Respiratory: Positive rhonchi; positive mild expiratory wheezing, improvement in her air movement. No use of accessory muscles and able to speak in full sentences now.   Abdomen: Soft, nontender, nondistended, positive bowel sounds  Musculoskeletal: No edema, no cyanosis   Discharge Instructions   Discharge Instructions    Diet - low sodium heart healthy    Complete by:  As directed      Discharge instructions    Complete by:  As directed   Arrange follow up with PCP in 1 week Take medications as prescribed Please stop smoking Follow low sodium diet (less than 2 gram sodium daily) Check weight on daily basis  Use oxygen 24/7 as instructed      Increase activity slowly    Complete by:  As directed           Current Discharge Medication List    START taking these medications   Details  albuterol-ipratropium (COMBIVENT) 18-103 MCG/ACT inhaler Inhale 1 puff into the lungs every 6 (six) hours as needed for wheezing or shortness of breath. Qty: 1 Inhaler, Refills: 2    budesonide-formoterol (SYMBICORT) 160-4.5 MCG/ACT inhaler Inhale 2 puffs into the lungs 2 (two) times daily. Qty: 1 Inhaler, Refills: 3    doxycycline (VIBRA-TABS) 100 MG tablet Take 1 tablet (100 mg total) by mouth every 12 (twelve) hours. Qty: 12 tablet, Refills: 0    guaiFENesin (MUCINEX) 600 MG 12 hr tablet Take 1 tablet (600 mg total) by mouth 2 (two) times daily. Qty: 40 tablet, Refills: 0      CONTINUE these medications which have CHANGED   Details  predniSONE (DELTASONE) 20 MG tablet Take 3 tabs daily for 2 days, then 2 tabs daily x 3 days, then 1 tab daily for 3 days; then 1/2 tablet for 3 days and stop prednisone Qty: 18 tablet, Refills: 0      CONTINUE these medications which have NOT CHANGED   Details  albuterol (PROVENTIL) (2.5 MG/3ML) 0.083% nebulizer solution Take 2.5 mg by nebulization every 6 (six) hours as needed for wheezing or shortness of breath.     aspirin 81 MG tablet Take 1 tablet by mouth daily.    famotidine (PEPCID) 20 MG tablet Take 1 tablet by mouth 2 (two) times daily.    lidocaine (XYLOCAINE) 5 % ointment Apply 1 application topically 2 (two)  times daily. Refills: 0    lidocaine-prilocaine (EMLA) cream Apply 1 application topically daily as needed (port use).     OPANA ER, CRUSH RESISTANT, 7.5 MG T12A Take 1 tablet by mouth daily.    pantoprazole (PROTONIX) 40 MG tablet Take 1 tablet by mouth daily.    saxagliptin HCl (ONGLYZA) 5 MG TABS tablet Take 5 mg by mouth daily.    SPIRIVA HANDIHALER 18 MCG inhalation capsule Place 1 capsule into inhaler and inhale daily.    valsartan-hydrochlorothiazide (DIOVAN-HCT) 160-12.5  MG tablet Take 1 tablet by mouth daily.       No Known Allergies Follow-up Information    Follow up with Maggie Font, MD. Schedule an appointment as soon as possible for a visit in 1 week.   Specialty:  Family Medicine   Contact information:   [address] [place] [address] [phone]       The results of significant diagnostics from this hospitalization (including imaging, microbiology, ancillary and laboratory) are listed below for reference.    Significant Diagnostic Studies: Dg Chest 2 View  11/22/2015  CLINICAL DATA:  Shortness of breath, hit history of bronchitis, COPD EXAM: CHEST  2 VIEW COMPARISON:  11/20/2015 FINDINGS: Cardiomegaly again noted. Mild hyperinflation. Persistent bilateral reticular interstitial prominence highly suspicious for interstitial edema or pneumonitis. No pleural effusion. No pneumothorax. IMPRESSION: Hyperinflation again noted. Cardiomegaly. Persistent bilateral reticular interstitial prominence highly suspicious for interstitial edema or pneumonitis. Electronically Signed   By: Lahoma Crocker M.D.   On: 11/22/2015 14:24   Dg Chest Port 1 View  11/20/2015  CLINICAL DATA:  Shortness of breath for 1 week. EXAM: PORTABLE CHEST 1 VIEW COMPARISON:  None. FINDINGS: Cardiomegaly, pulmonary vascular congestion and interstitial opacities noted, likely representing interstitial edema. No definite pleural effusions or pneumothorax. No acute bony abnormalities are present. IMPRESSION: Pulmonary is scratch a cardiomegaly, pulmonary vascular congestion and probable interstitial pulmonary edema. Electronically Signed   By: Margarette Canada M.D.   On: 11/20/2015 11:27    Microbiology: Recent Results (from the past 240 hour(s))  MRSA PCR Screening     Status: None   Collection Time: 11/21/15  3:12 AM  Result Value Ref Range Status   MRSA by PCR NEGATIVE NEGATIVE Final    Comment:        The GeneXpert MRSA Assay (FDA approved for NASAL specimens only), is one  component of a comprehensive MRSA colonization surveillance program. It is not intended to diagnose MRSA infection nor to guide or monitor treatment for MRSA infections.      Labs: Basic Metabolic Panel:  Recent Labs Lab 11/20/15 1024 11/21/15 0518  NA 137 139  K 4.3 5.0  CL 98* 98*  CO2 32 32  GLUCOSE 111* 158*  BUN 20 30*  CREATININE 1.15* 1.17*  CALCIUM 8.8* 8.4*  MG 1.8  --   PHOS 3.2  --    Liver Function Tests:  Recent Labs Lab 11/20/15 1024  AST 18  ALT 13*  ALKPHOS 69  BILITOT 0.4  PROT 6.9  ALBUMIN 3.8   CBC:  Recent Labs Lab 11/20/15 1024 11/21/15 0518  WBC 9.5 9.7  NEUTROABS 8.1*  --   HGB 16.3* 15.0  HCT 46.7* 44.1  MCV 85.8 86.0  PLT 157 147*   Cardiac Enzymes:  Recent Labs Lab 11/20/15 1024  TROPONINI <0.03   BNP: BNP (last 3 results)  Recent Labs  11/20/15 1024  BNP 133.0*    CBG:  Recent Labs  Lab 11/22/15 1159 11/22/15 1616 11/22/15 2121 11/23/15 0733 11/23/15 1141  GLUCAP 186* 110* 226* 146* 174*    Signed:  Barton Dubois MD.  [facility] 11/23/2015, 12:04 PM

## 2015-11-23 NOTE — Progress Notes (Signed)
Discharge instruction reviewed with patient. No distress noted . Patient taken to lobby via wheelchair.

## 2015-11-23 NOTE — Care Management Note (Signed)
Case Management Note  Patient Details  Name: Nikka Griesmer MRN: [id number] Date of Birth: 02-11-1951  Subjective/Objective:                  Pt admitted with acute/chronic resp failure. Pt's son at bedside. Pt is from home, lives with son and is ind with ADL's. Pt has no HH services prior to admission. Pt has neb machine and home O2 for nighttime use, pt does not have port tanks. Pt does not meet requirements for continuous O2 at this time. Pt has PCP and plans to driver herself to appointments. Pt has no problems obtaining her medications. Pt plans to return home with self care today.   Action/Plan: No CM needs.   Expected Discharge Date:  11/23/15               Expected Discharge Plan:  Home/Self Care  In-House Referral:  NA  Discharge planning Services  CM Consult  Post Acute Care Choice:  NA Choice offered to:  NA  DME Arranged:    DME Agency:     HH Arranged:    HH Agency:     Status of Service:  Completed, signed off  Medicare Important Message Given:    yes Date Medicare IM Given:    Medicare IM give by:    Date Additional Medicare IM Given:    Additional Medicare Important Message give by:     If discussed at [place] of Stay Meetings, dates discussed:    Additional Comments:  Sherald Barge, RN 11/23/2015, 4:08 PM

## 2015-11-23 NOTE — Consult Note (Signed)
   [facility] CM Inpatient Consult   11/23/2015  Auryn Selner 1951/09/11 [id number]   Spoke with patient at bedside, she continues to states she wants to continue participation with [facility] services. Patient is currently active with [facility] for chronic disease management services.  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  Our community based plan of care has focused on disease management and community resource support.  Patient will receive a post discharge transition of care call and will be evaluated for monthly home visits for assessments and disease process education.  Made Inpatient Case Manager aware that [facility] following.  Of note, [facility] Care Management services does not replace or interfere with any services that are arranged by inpatient case management or social work.   For additional questions or referrals please contact:  Royetta Crochet. Laymond Purser, RN, BSN, [place] Liaison [phone]

## 2015-11-25 ENCOUNTER — Other Ambulatory Visit: Payer: Self-pay

## 2015-11-25 NOTE — Patient Outreach (Signed)
Unsuccessful attempt made to contact patient for assessment of community care coordination. Female answered telephone at [phone], stated patient was not at home, did not know when she would be. Female asked for this RNCM to call back later.    Plan: Telephone contact on tomorrow, March 3

## 2015-11-26 ENCOUNTER — Other Ambulatory Visit: Payer: Self-pay

## 2015-11-26 NOTE — Patient Outreach (Signed)
Successful telephone contact with patient for assessment of community care coordination needs.  Patient identified herself by providing date of birth and address.  Patient and this RNCM collaborated to form this initial care plan to focus on COPD education.  Home visit planned for March 9 for additional education, assessment of needs.

## 2015-12-01 DIAGNOSIS — I10 Essential (primary) hypertension: Secondary | ICD-10-CM | POA: Diagnosis not present

## 2015-12-01 DIAGNOSIS — Z79891 Long term (current) use of opiate analgesic: Secondary | ICD-10-CM | POA: Diagnosis not present

## 2015-12-01 DIAGNOSIS — J449 Chronic obstructive pulmonary disease, unspecified: Secondary | ICD-10-CM | POA: Diagnosis not present

## 2015-12-01 DIAGNOSIS — E119 Type 2 diabetes mellitus without complications: Secondary | ICD-10-CM | POA: Diagnosis not present

## 2015-12-02 ENCOUNTER — Other Ambulatory Visit: Payer: Self-pay

## 2015-12-02 NOTE — Patient Outreach (Signed)
[facility]) Care Management  12/02/2015  Erica [name] May 30, 1951 [id number]   This RNCM called patient en route to her home to get physical address as patient has a post office box listed as her address. Patient answered telephone, provided date of birth and p.o. Box address.  Patient then informed his RNCM she could not meet today, she has an appointment with a heart doctor in [place], [place] today.  She stated further the appointment was just made today.   Patient and this RNCM agreed to [facility] contact next week to reschedule home visit.

## 2015-12-03 DIAGNOSIS — J449 Chronic obstructive pulmonary disease, unspecified: Secondary | ICD-10-CM | POA: Diagnosis not present

## 2015-12-07 ENCOUNTER — Other Ambulatory Visit: Payer: Self-pay

## 2015-12-07 NOTE — Patient Outreach (Signed)
Unsuccessful attempt made to contact patient via telephone at [phone] or at [phone]. Attempted to contact to reschedule appointment abruptly cancelled last week because patient reported having an appointment with her cardiologist in [place], [place].    Plan:  Plan to attempt contact patient on Friday, March 17.

## 2015-12-10 ENCOUNTER — Other Ambulatory Visit: Payer: Self-pay

## 2015-12-10 NOTE — Patient Outreach (Signed)
Unsuccessful attempt  Made to contact patient via telephone for assessment of community care coordination, discuss plan of care.  Female who answered telephone at [phone] stated patient was not at home, informed me I could call back later to talk with her.  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

## 2015-12-13 ENCOUNTER — Other Ambulatory Visit: Payer: Self-pay

## 2015-12-13 NOTE — Patient Outreach (Signed)
Unsuccessful attempt made to contact patient via telephone.  Will make another attempt to contact patient via telephone on Tuesday, March 21.

## 2015-12-14 ENCOUNTER — Other Ambulatory Visit: Payer: Self-pay

## 2015-12-15 NOTE — Patient Outreach (Signed)
This RNCM was finally successful in making telephone contact with patient who identified herself by date of birth and address.  Patient and RNCM discussed patient's case management care plan which includes COPD Education. Patient states she has an appointment with her primary care physician, Dr. Berdine Addison on April 5.   Patient and this RNCM agreed to meet with patient at that appointment for rCOPD education.    Plan: Telephone contact week of March 27, Appointment with primary care physician on April 5

## 2015-12-23 DIAGNOSIS — J441 Chronic obstructive pulmonary disease with (acute) exacerbation: Secondary | ICD-10-CM | POA: Diagnosis not present

## 2015-12-23 DIAGNOSIS — R0602 Shortness of breath: Secondary | ICD-10-CM | POA: Diagnosis not present

## 2015-12-27 DIAGNOSIS — E119 Type 2 diabetes mellitus without complications: Secondary | ICD-10-CM | POA: Diagnosis not present

## 2015-12-27 DIAGNOSIS — K5903 Drug induced constipation: Secondary | ICD-10-CM | POA: Diagnosis not present

## 2015-12-27 DIAGNOSIS — Z79899 Other long term (current) drug therapy: Secondary | ICD-10-CM | POA: Diagnosis not present

## 2015-12-27 DIAGNOSIS — K219 Gastro-esophageal reflux disease without esophagitis: Secondary | ICD-10-CM | POA: Diagnosis not present

## 2015-12-27 DIAGNOSIS — B370 Candidal stomatitis: Secondary | ICD-10-CM | POA: Diagnosis not present

## 2015-12-27 DIAGNOSIS — J449 Chronic obstructive pulmonary disease, unspecified: Secondary | ICD-10-CM | POA: Diagnosis not present

## 2015-12-27 DIAGNOSIS — E782 Mixed hyperlipidemia: Secondary | ICD-10-CM | POA: Diagnosis not present

## 2015-12-27 DIAGNOSIS — Z0000 Encounter for general adult medical examination without abnormal findings: Secondary | ICD-10-CM | POA: Diagnosis not present

## 2015-12-27 DIAGNOSIS — I10 Essential (primary) hypertension: Secondary | ICD-10-CM | POA: Diagnosis not present

## 2015-12-28 DIAGNOSIS — J449 Chronic obstructive pulmonary disease, unspecified: Secondary | ICD-10-CM | POA: Diagnosis not present

## 2015-12-28 DIAGNOSIS — I10 Essential (primary) hypertension: Secondary | ICD-10-CM | POA: Diagnosis not present

## 2015-12-28 DIAGNOSIS — E782 Mixed hyperlipidemia: Secondary | ICD-10-CM | POA: Diagnosis not present

## 2015-12-28 DIAGNOSIS — E119 Type 2 diabetes mellitus without complications: Secondary | ICD-10-CM | POA: Diagnosis not present

## 2015-12-28 DIAGNOSIS — Z79899 Other long term (current) drug therapy: Secondary | ICD-10-CM | POA: Diagnosis not present

## 2015-12-29 ENCOUNTER — Other Ambulatory Visit: Payer: Self-pay

## 2015-12-29 DIAGNOSIS — E119 Type 2 diabetes mellitus without complications: Secondary | ICD-10-CM | POA: Diagnosis not present

## 2015-12-29 DIAGNOSIS — R0602 Shortness of breath: Secondary | ICD-10-CM | POA: Diagnosis not present

## 2015-12-29 DIAGNOSIS — J441 Chronic obstructive pulmonary disease with (acute) exacerbation: Secondary | ICD-10-CM | POA: Diagnosis not present

## 2015-12-29 DIAGNOSIS — K5900 Constipation, unspecified: Secondary | ICD-10-CM | POA: Diagnosis not present

## 2015-12-30 NOTE — Patient Outreach (Signed)
[facility]) Care Management  12/30/2015  Erica [name] 1951/02/26 [id number]   Unsuccessful attempt to make telephone contact with patient to coordinate office visit to coordinate care. Plan: Make another attempt to contact patient via telephone on Friday, April 7 to assess need for community care coordination and to schedule home visit.

## 2015-12-31 ENCOUNTER — Other Ambulatory Visit: Payer: Self-pay

## 2015-12-31 NOTE — Patient Outreach (Signed)
Unsuccessful attempt made to contact patient via telephone at  [phone]  Plan: Make another attempt to contact patient on Monday, April 10

## 2016-01-03 ENCOUNTER — Other Ambulatory Visit: Payer: Self-pay

## 2016-01-03 DIAGNOSIS — J449 Chronic obstructive pulmonary disease, unspecified: Secondary | ICD-10-CM | POA: Diagnosis not present

## 2016-01-03 NOTE — Patient Outreach (Signed)
Successful attempt made to contact patient via telephone for community care coordination, schedule home visit. Home visit scheduled for tomorrow for COPD Education and assessment of community case management needs.

## 2016-01-04 ENCOUNTER — Other Ambulatory Visit: Payer: Self-pay

## 2016-01-04 ENCOUNTER — Emergency Department (HOSPITAL_COMMUNITY): Payer: Commercial Managed Care - HMO

## 2016-01-04 ENCOUNTER — Encounter (HOSPITAL_COMMUNITY): Payer: Self-pay | Admitting: Emergency Medicine

## 2016-01-04 ENCOUNTER — Emergency Department (HOSPITAL_COMMUNITY)
Admission: EM | Admit: 2016-01-04 | Discharge: 2016-01-04 | Disposition: A | Discharge status: Home / Self Care | Payer: Commercial Managed Care - HMO | Attending: Emergency Medicine | Admitting: Emergency Medicine

## 2016-01-04 DIAGNOSIS — Z7982 Long term (current) use of aspirin: Secondary | ICD-10-CM | POA: Diagnosis not present

## 2016-01-04 DIAGNOSIS — W228XXA Striking against or struck by other objects, initial encounter: Secondary | ICD-10-CM | POA: Diagnosis not present

## 2016-01-04 DIAGNOSIS — Z79899 Other long term (current) drug therapy: Secondary | ICD-10-CM | POA: Insufficient documentation

## 2016-01-04 DIAGNOSIS — Y929 Unspecified place or not applicable: Secondary | ICD-10-CM | POA: Diagnosis not present

## 2016-01-04 DIAGNOSIS — F17210 Nicotine dependence, cigarettes, uncomplicated: Secondary | ICD-10-CM | POA: Diagnosis not present

## 2016-01-04 DIAGNOSIS — S5401XA Injury of ulnar nerve at forearm level, right arm, initial encounter: Secondary | ICD-10-CM | POA: Diagnosis not present

## 2016-01-04 DIAGNOSIS — R0789 Other chest pain: Secondary | ICD-10-CM | POA: Diagnosis not present

## 2016-01-04 DIAGNOSIS — E119 Type 2 diabetes mellitus without complications: Secondary | ICD-10-CM | POA: Diagnosis not present

## 2016-01-04 DIAGNOSIS — J441 Chronic obstructive pulmonary disease with (acute) exacerbation: Secondary | ICD-10-CM | POA: Diagnosis not present

## 2016-01-04 DIAGNOSIS — Y9389 Activity, other specified: Secondary | ICD-10-CM | POA: Diagnosis not present

## 2016-01-04 DIAGNOSIS — N289 Disorder of kidney and ureter, unspecified: Secondary | ICD-10-CM | POA: Diagnosis not present

## 2016-01-04 DIAGNOSIS — R05 Cough: Secondary | ICD-10-CM | POA: Diagnosis not present

## 2016-01-04 DIAGNOSIS — Y999 Unspecified external cause status: Secondary | ICD-10-CM | POA: Diagnosis not present

## 2016-01-04 DIAGNOSIS — I10 Essential (primary) hypertension: Secondary | ICD-10-CM | POA: Insufficient documentation

## 2016-01-04 DIAGNOSIS — Z7984 Long term (current) use of oral hypoglycemic drugs: Secondary | ICD-10-CM | POA: Insufficient documentation

## 2016-01-04 DIAGNOSIS — R209 Unspecified disturbances of skin sensation: Secondary | ICD-10-CM | POA: Diagnosis present

## 2016-01-04 LAB — I-STAT CHEM 8, ED
BUN: 57 mg/dL — AB (ref 6–20)
CHLORIDE: 95 mmol/L — AB (ref 101–111)
CREATININE: 1.7 mg/dL — AB (ref 0.44–1.00)
Calcium, Ion: 1.12 mmol/L — ABNORMAL LOW (ref 1.13–1.30)
Glucose, Bld: 140 mg/dL — ABNORMAL HIGH (ref 65–99)
HCT: 48 % — ABNORMAL HIGH (ref 36.0–46.0)
HEMOGLOBIN: 16.3 g/dL — AB (ref 12.0–15.0)
Potassium: 5.3 mmol/L — ABNORMAL HIGH (ref 3.5–5.1)
Sodium: 137 mmol/L (ref 135–145)
TCO2: 37 mmol/L (ref 0–100)

## 2016-01-04 MED ORDER — INSULIN ASPART 100 UNIT/ML ~~LOC~~ SOLN
SUBCUTANEOUS | Status: AC
Start: 2016-01-04 — End: 2016-01-04
  Filled 2016-01-04: qty 1

## 2016-01-04 MED ORDER — SODIUM CHLORIDE 0.9 % IV BOLUS (SEPSIS)
500.0000 mL | Freq: Once | INTRAVENOUS | Status: AC
  Administered 2016-01-04: 500 mL via INTRAVENOUS

## 2016-01-04 MED ORDER — METHYLPREDNISOLONE SODIUM SUCC 125 MG IJ SOLR
125.0000 mg | Freq: Once | INTRAMUSCULAR | Status: AC
  Administered 2016-01-04: 125 mg via INTRAVENOUS

## 2016-01-04 MED ORDER — LEVOFLOXACIN 500 MG PO TABS: 500.0000 mg | ORAL_TABLET | Freq: Every day | ORAL | Status: DC

## 2016-01-04 MED ORDER — ALBUTEROL SULFATE (2.5 MG/3ML) 0.083% IN NEBU
5.0000 mg | INHALATION_SOLUTION | Freq: Once | RESPIRATORY_TRACT | Status: AC
  Administered 2016-01-04: 5 mg via RESPIRATORY_TRACT
  Filled 2016-01-04: qty 6

## 2016-01-04 MED ORDER — PREDNISONE 20 MG PO TABS: 40.0000 mg | ORAL_TABLET | Freq: Every day | ORAL | Status: DC

## 2016-01-04 MED ORDER — METHYLPREDNISOLONE SODIUM SUCC 125 MG IJ SOLR
125.0000 mg | Freq: Once | INTRAMUSCULAR | Status: DC
  Filled 2016-01-04: qty 2

## 2016-01-04 NOTE — Discharge Instructions (Signed)
Chronic Obstructive Pulmonary Disease Exacerbation Chronic obstructive pulmonary disease (COPD) is a common lung condition in which airflow from the lungs is limited. COPD is a general term that can be used to describe many different lung problems that limit airflow, including chronic bronchitis and emphysema. COPD exacerbations are episodes when breathing symptoms become much worse and require extra treatment. Without treatment, COPD exacerbations can be life threatening, and frequent COPD exacerbations can cause further damage to your lungs. CAUSES  Respiratory infections.  Exposure to smoke.  Exposure to air pollution, chemical fumes, or dust. Sometimes there is no apparent cause or trigger. RISK FACTORS  Smoking cigarettes.  Older age.  Frequent prior COPD exacerbations. SIGNS AND SYMPTOMS  Increased coughing.  Increased thick spit (sputum) production.  Increased wheezing.  Increased shortness of breath.  Rapid breathing.  Chest tightness. DIAGNOSIS Your medical history, a physical exam, and tests will help your health care provider make a diagnosis. Tests may include:  A chest X-ray.  Basic lab tests.  Sputum testing.  An arterial blood gas test. TREATMENT Depending on the severity of your COPD exacerbation, you may need to be admitted to a hospital for treatment. Some of the treatments commonly used to treat COPD exacerbations are:   Antibiotic medicines.  Bronchodilators. These are drugs that expand the air passages. They may be given with an inhaler or nebulizer. Spacer devices may be needed to help improve drug delivery.  Corticosteroid medicines.  Supplemental oxygen therapy.  Airway clearing techniques, such as noninvasive ventilation (NIV) and positive expiratory pressure (PEP). These provide respiratory support through a mask or other noninvasive device. HOME CARE INSTRUCTIONS  Do not smoke. Quitting smoking is very important to prevent COPD from  getting worse and exacerbations from happening as often.  Avoid exposure to all substances that irritate the airway, especially to tobacco smoke.  If you were prescribed an antibiotic medicine, finish it all even if you start to feel better.  Take all medicines as directed by your health care provider.It is important to use correct technique with inhaled medicines.  Drink enough fluids to keep your urine clear or pale yellow (unless you have a medical condition that requires fluid restriction).  Use a cool mist vaporizer. This makes it easier to clear your chest when you cough.  If you have a home nebulizer and oxygen, continue to use them as directed.  Maintain all necessary vaccinations to prevent infections.  Exercise regularly.  Eat a healthy diet.  Keep all follow-up appointments as directed by your health care provider. SEEK IMMEDIATE MEDICAL CARE IF:  You have worsening shortness of breath.  You have trouble talking.  You have severe chest pain.  You have blood in your sputum.  You have a fever.  You have weakness, vomit repeatedly, or faint.  You feel confused.  You continue to get worse. MAKE SURE YOU:  Understand these instructions.  Will watch your condition.  Will get help right away if you are not doing well or get worse.   This information is not intended to replace advice given to you by your health care provider. Make sure you discuss any questions you have with your health care provider.   Document Released: 07/09/2007 Document Revised: 10/02/2014 Document Reviewed: 05/16/2013 Elsevier Interactive Patient Education 2016 [employer]vier [employer]  Chest Wall Pain Chest wall pain is pain in or around the bones and muscles of your chest. Sometimes, an injury causes this pain. Sometimes, the cause may not be known. This pain may  take several weeks or longer to get better. HOME CARE INSTRUCTIONS  Pay attention to any changes in your symptoms. Take these actions  to help with your pain:   Rest as told by your health care provider.   Avoid activities that cause pain. These include any activities that use your chest muscles or your abdominal and side muscles to lift heavy items.   If directed, apply ice to the painful area:  Put ice in a plastic bag.  Place a towel between your skin and the bag.  Leave the ice on for 20 minutes, 2-3 times per day.  Take over-the-counter and prescription medicines only as told by your health care provider.  Do not use tobacco products, including cigarettes, chewing tobacco, and e-cigarettes. If you need help quitting, ask your health care provider.  Keep all follow-up visits as told by your health care provider. This is important. SEEK MEDICAL CARE IF:  You have a fever.  Your chest pain becomes worse.  You have new symptoms. SEEK IMMEDIATE MEDICAL CARE IF:  You have nausea or vomiting.  You feel sweaty or light-headed.  You have a cough with phlegm (sputum) or you cough up blood.  You develop shortness of breath.   This information is not intended to replace advice given to you by your health care provider. Make sure you discuss any questions you have with your health care provider.   Document Released: 09/11/2005 Document Revised: 06/02/2015 Document Reviewed: 12/07/2014 Elsevier Interactive Patient Education 2016 [place] is a temporary loss of nerve function. It does not cause permanent damage to a nerve. If your foot "falls asleep," that is a type of neurapraxia. It will go away as soon as you start moving your foot. A more serious neurapraxia could take up to 6 weeks to go away. CAUSES   Anything that strains a nerve can cause neurapraxia. The nerve might be stretched or twisted. Something might press or pound on it and cause decreased blood flow to the nerve. Causes of neurapraxia can include:  Bones that break (fracture) or move out of place (dislocation).  This can cause the nerves to stretch or twist out of their normal position. This happens most often in the arms and shoulders.  Neck strain when the head moves suddenly, such as in a car crash. This is called whiplash (cervical neurapraxia). It can also happen while playing sports. For example, a football injury called a stinger is a type of neurapraxia. It causes severe pain to shoot down an arm.  Stretching the neck too far from the shoulder. This damages the nerves that go into the shoulder, arm, and hand. It causes numbness and muscle weakness. This condition is called brachial plexus neurapraxia.  Repeated or prolonged pressure can cause decreased blood flow to the nerve (ischemic neurapraxia). Such causes of neurapraxia can include:  A cast or bandage that is too tight around an injured arm or leg. This limits blood flow to the nerves and causes a condition called compartment syndrome. This condition develops when pressure builds up around muscles and nerves.  Infection and disease. This can cut off blood flow to the nerve.  Very cold temperatures.  Sleeping in a position that limits blood flow to your leg or arm. SIGNS AND SYMPTOMS  Numbness and tingling.  Muscle weakness.  Burning pain.  Cool skin. DIAGNOSIS  Neurapraxia is diagnosed through:  A physical exam. This will include asking questions about your health. Your health care  provider will ask about any symptoms you are having. Your health care provider may also:  Test how strong your muscles are.  Check feeling (sensation) in various areas of your body. A very light touch or pricks with a pin may be used.  Check whether you have signs of nerve damage on one side of the body or both.  Tests such as:  Electromyography (EMG). This test measures electrical activity in a muscle. It shows whether the nerve that supplies the muscle is working.  Nerve conduction studies (NCS). They measure the flow of electricity through a  nerve.  Magnetic resonance imaging (MRI). This is a machine that uses magnets and a computer to create pictures of your nerves. TREATMENT  Treatment aims to ease pain and swelling and to provide support while your body heals.  Medicine may include:  Pain medicine.  Antidepressants.  Seizure medicine.  Medicine to reduce swelling.  For support, options may include:  Braces, walkers, or crutches.  Physical therapy. Having a specialist work with you often speeds healing. It can also help prevent stiffness and future damage.  Surgery. This may be needed if broken or dislocated bones are part of the problem. Surgery also may be done to relieve pressure on nerves or to restore normal blood flow to nerves and muscles.  Electrical stimulators. These devices send pulses of electricity into the muscles. The aim is to bring back movement. HOME CARE INSTRUCTIONS What you need to do at home will vary. It will depend on your treatment plan and the type of neurapraxia you have. In general:  Take medicine as told by your health care provider. Follow the directions carefully.  Rest. Give your body time to heal.  Use any splints, braces, or other support devices as directed. If you have questions about their use, ask your health care provider.  Start physical therapy, if that is suggested. Ask if it is okay to practice the exercises at home, too.  If areas of your body are numb, take care to protect them from burns or other injury.  If you had surgery, you will need to care for your surgical cut (incision). Ask for instructions before you leave the hospital.  Keep all follow-up appointments with your health care provider. SEEK MEDICAL CARE IF:   You have any questions about your medicine.  Numbness or muscle weakness continues.  Pain continues, even after taking pain medicine. SEEK IMMEDIATE MEDICAL CARE IF:   Your pain suddenly becomes severe.  Numbness or weakness gets much  worse.  Your muscles start to twitch or you have muscle spasms.   This information is not intended to replace advice given to you by your health care provider. Make sure you discuss any questions you have with your health care provider.   Document Released: 02/13/2011 Document Revised: 10/02/2014 Document Reviewed: 03/15/2015 Elsevier Interactive Patient Education [employer].

## 2016-01-04 NOTE — ED Provider Notes (Signed)
CSN: [id number]     Arrival date & time 01/04/16  1231 History   First MD Initiated Contact with Patient 01/04/16 1305     Chief Complaint  Patient presents with  . Arm Pain     (Consider location/radiation/quality/duration/timing/severity/associated sxs/prior Treatment) HPI Patient presents with right hand and lower arm paresthesias for the past 2 days. States she went to sit in a chair and struck her elbow on the arm radial. She has no weakness. The tingling sensation is mostly in the hand ulnar side of the forearm and the third through fifth digits. No previously similar symptoms. Next  Patient also has a history of COPD. She wears 2 L of oxygen consistently. She states she's had a productive cough of yellow sputum recently right-sided chest pain with deep breathing and increase dyspnea with exertion. She currently complains of no chest pain or shortness of breath. Past Medical History  Diagnosis Date  . Chronic back pain   . Oxygen deficiency   . Diabetes mellitus without complication ([place])   . Hypertension   . COPD (chronic obstructive pulmonary disease) [facility])    Past Surgical History  Procedure Laterality Date  . Abdominal hysterectomy     History reviewed. No pertinent family history. Social History  Substance Use Topics  . Smoking status: Current Every Day Smoker -- 1.00 packs/day for 16 years    Types: Cigarettes  . Smokeless tobacco: None  . Alcohol Use: No   OB History    Gravida Para Term Preterm AB TAB SAB Ectopic Multiple Living   0 0 0 0 0 0 0 0       Review of Systems  Constitutional: Negative for fever and chills.  Respiratory: Positive for cough, shortness of breath and wheezing.   Cardiovascular: Positive for chest pain. Negative for leg swelling.  Gastrointestinal: Negative for nausea, vomiting, abdominal pain and diarrhea.  Musculoskeletal: Negative for myalgias, back pain, arthralgias and neck pain.  Skin: Negative for rash and wound.  Neurological:  Positive for numbness. Negative for dizziness, weakness, light-headedness and headaches.  All other systems reviewed and are negative.     Allergies  Review of patient's allergies indicates no known allergies.  Home Medications   Prior to Admission medications   Medication Sig Start Date End Date Taking? Authorizing Provider  albuterol (PROVENTIL) (2.5 MG/3ML) 0.083% nebulizer solution Take 2.5 mg by nebulization every 6 (six) hours as needed for wheezing or shortness of breath.  09/09/15  Yes Historical Provider, MD  albuterol-ipratropium (COMBIVENT) 18-103 MCG/ACT inhaler Inhale 1 puff into the lungs every 6 (six) hours as needed for wheezing or shortness of breath. 11/23/15  Yes Barton Dubois, MD  aspirin 81 MG tablet Take 2 tablets by mouth daily.  10/22/15  Yes Historical Provider, MD  budesonide-formoterol (SYMBICORT) 160-4.5 MCG/ACT inhaler Inhale 2 puffs into the lungs 2 (two) times daily. 11/23/15  Yes Barton Dubois, MD  famotidine (PEPCID) 20 MG tablet Take 1 tablet by mouth 2 (two) times daily. 09/21/15  Yes Historical Provider, MD  gabapentin (NEURONTIN) 100 MG capsule Take 1 capsule by mouth daily. 11/05/13  Yes Historical Provider, MD  lidocaine-prilocaine (EMLA) cream Apply 1 application topically daily as needed (port use).  11/16/15  Yes Historical Provider, MD  Linaclotide Rolan Lipa) 145 MCG CAPS capsule Take 145 mcg by mouth daily.   Yes Historical Provider, MD  meloxicam (MOBIC) 15 MG tablet Take 1 tablet by mouth at bedtime. Reported on 09/22/2015 11/11/13  Yes Historical Provider, MD  metFORMIN (  GLUCOPHAGE) 500 MG tablet Take 500 mg by mouth 2 (two) times daily.   Yes Historical Provider, MD  pantoprazole (PROTONIX) 40 MG tablet Take 1 tablet by mouth daily. 10/28/15  Yes Historical Provider, MD  saxagliptin HCl (ONGLYZA) 5 MG TABS tablet Take 5 mg by mouth daily. Reported on 09/22/2015   Yes Historical Provider, MD  SPIRIVA HANDIHALER 18 MCG inhalation capsule Place 1 capsule  into inhaler and inhale daily. 09/22/15  Yes Historical Provider, MD  valsartan-hydrochlorothiazide (DIOVAN-HCT) 160-12.5 MG per tablet Take 1 tablet by mouth daily.   Yes Historical Provider, MD  levofloxacin (LEVAQUIN) 500 MG tablet Take 1 tablet (500 mg total) by mouth daily. 01/04/16   Julianne Rice, MD  predniSONE (DELTASONE) 20 MG tablet Take 2 tablets (40 mg total) by mouth daily. 01/04/16   Julianne Rice, MD   BP 119/59 mmHg  Pulse 87  Temp(Src) 98.2 F (36.8 C) (Oral)  Resp 18  Ht 5\' 4"  (1.626 m)  Wt 167 lb (75.751 kg)  BMI 28.65 kg/m2  SpO2 90% Physical Exam  Constitutional: She is oriented to person, place, and time. She appears well-developed and well-nourished. No distress.  HENT:  Head: Normocephalic and atraumatic.  Mouth/Throat: Oropharynx is clear and moist. No oropharyngeal exudate.  Eyes: EOM are normal. Pupils are equal, round, and reactive to light.  Neck: Normal range of motion. Neck supple. No JVD present.  Cardiovascular: Normal rate and regular rhythm.  Exam reveals no gallop and no friction rub.   No murmur heard. Pulmonary/Chest: Effort normal and breath sounds normal. No respiratory distress. She has no wheezes. She has no rales. She exhibits no tenderness.  Patient has diffuse inspiratory wheezing. Decreased air movement. Patient is in no respiratory distress.  Abdominal: Soft. Bowel sounds are normal. She exhibits no distension and no mass. There is no tenderness. There is no rebound and no guarding.  Musculoskeletal: Normal range of motion. She exhibits no edema or tenderness.  Patient full range of motion of the right elbow. No tenderness to palpation. No obvious swelling. Distal pulses are 2+. Distal cap refill is 2+. Positive Tinel sign over the ulnar nerve on the right.  Neurological: She is alert and oriented to person, place, and time.  5/5 grip strength bilaterally. Patient has decreased sensation to the third through fifth digits to the right hand  as well as the ulnar surface of the palm and ulnar surface of the right forearm.  Skin: Skin is warm and dry. No rash noted. No erythema.  Psychiatric: She has a normal mood and affect. Her behavior is normal.  Nursing note and vitals reviewed.   ED Course  Procedures (including critical care time) Labs Review Labs Reviewed  I-STAT CHEM 8, ED - Abnormal; Notable for the following:    Potassium 5.3 (*)    Chloride 95 (*)    BUN 57 (*)    Creatinine, Ser 1.70 (*)    Glucose, Bld 140 (*)    Calcium, Ion 1.12 (*)    Hemoglobin 16.3 (*)    HCT 48.0 (*)    All other components within normal limits    Imaging Review Dg Chest 2 View  01/04/2016  CLINICAL DATA:  64 year old female with right upper extremity pain for 2 days with no known injury. Cough. Initial encounter. EXAM: CHEST  2 VIEW COMPARISON:  Report of 09/05/2015 radiographs (no images available). FINDINGS: Chronic interstitial markings are described on the prior study. There are small pleural effusions. Calcified pleural plaques are  suspected in the right lung. No pneumothorax. No consolidation. Stable cardiomegaly and mediastinal contours. Visualized tracheal air column is within normal limits. No acute osseous abnormality identified. Calcified aortic at sclerosis. IMPRESSION: Small bilateral pleural effusions. No other acute cardiopulmonary abnormality; diffuse pulmonary interstitial markings felt to be chronic. Electronically Signed   By: Genevie Ann M.D.   On: 01/04/2016 13:40   I have personally reviewed and evaluated these images and lab results as part of my medical decision-making.   EKG Interpretation None      MDM   Final diagnoses:  Neuropraxia of right ulnar nerve, initial encounter  Chronic obstructive pulmonary disease with acute exacerbation (HCC)  Right-sided chest wall pain  Renal insufficiency   Patient states she is feeling much better after breathing treatment. Chest x-ray without any acute findings. Given  history of COPD and productive cough will start on antibiotics for possible atypical infection. Patient does have mild increase in her creatinine and BUN. Possibly due to mild dehydration. Given IV fluids in the emergency department. Patient's right arm paresthesias likely due to ulnar nerve neuropraxia. She stands need to follow-up with neurology should symptoms persist greater than 1 week. Return precautions given.     Julianne Rice, MD 01/04/16 1520

## 2016-01-04 NOTE — Patient Outreach (Signed)
[facility]) Care Management  01/04/2016  Erica [name] September 04, 1951 [id number]   Initial home visit for community care coordination and COPD Education. During this home visit, patient complained of right lower arm numbness for the last 3 days, dizziness.  Call made to Dr. Cathey Endow office, was advised by Dr. Cathey Endow Nurse to get patient to emergency room Patient advised, refused emergency transportation. Patient elected instead to have her cousin transport her in her (patient's) car.  Patient stated she has been out of her fluid pills for 2 weeks. Patient also stated she was started on Linzess 145 mg, was given samples by Dr. Berdine Addison.   Patient reports not being able to afford fluid medication or Liness. Patient states she has chronic constipation, Linzess has helped her a whole lot.    Initial case management care plan goals focus will be on COPD Education, being connected to appropriate community resources to assist with purchasing medication. Cousin arrived to transport patient to the emergency room at [place], [facility] Liaison, called and advised of assessment and physician's advice for patient to be evaluated in [facility] Emergency Room.  Plan: Telephone call Thursday to reschedule home visit. Pharmacy referral to assess for community resource to assist with purchasing medications.

## 2016-01-04 NOTE — ED Notes (Addendum)
PT c/o right arm pain with no reported injury x2 days. Upper extremity strength are equal in both sides. PT states painful pulsation with tingling. PT also c/o cough and right sided rib pain with cramping.

## 2016-01-05 ENCOUNTER — Other Ambulatory Visit: Payer: Self-pay | Admitting: Pharmacist

## 2016-01-05 NOTE — Patient Outreach (Addendum)
[place] [facility]) Care Management  [place]   01/05/2016  Erica [name] 26-Nov-1950 [id number]  Subjective:  Patient was referred to [place] for patient assistance with a fluid pill and Linzess per referral from [facility] [place].   Pharmacist placed call to patient and patient verified name and date of birth.  The only fluid pill on patient's medication list was valsartan/hydrochlorothiazide 160/12.5 mg.  Patient reports that her PCP [name] increased this to valsartan/hydrochlorothiazide 320/25 mg daily and she reports she has both at home but is only taking the higher strength.    Patient was unsure what other fluid pill she may take.   She reports that she had previously filled Amitiza and had sample from her PCP, she also reports that she has samples of Linzess from PCP.  Patient reports Linzess helps her more.    Patient appears to have be fully dual eligible with [facility] SNP plan, which means that patient assistance options will be very limited.    Objective:   Current Medications: Current Outpatient Prescriptions  Medication Sig Dispense Refill  . albuterol (PROVENTIL) (2.5 MG/3ML) 0.083% nebulizer solution Take 2.5 mg by nebulization every 6 (six) hours as needed for wheezing or shortness of breath.     Marland Kitchen albuterol-ipratropium (COMBIVENT) 18-103 MCG/ACT inhaler Inhale 1 puff into the lungs every 6 (six) hours as needed for wheezing or shortness of breath. 1 Inhaler 2  . aspirin 81 MG tablet Take 2 tablets by mouth daily.     . budesonide-formoterol (SYMBICORT) 160-4.5 MCG/ACT inhaler Inhale 2 puffs into the lungs 2 (two) times daily. 1 Inhaler 3  . famotidine (PEPCID) 20 MG tablet Take 1 tablet by mouth 2 (two) times daily.    Marland Kitchen gabapentin (NEURONTIN) 100 MG capsule Take 1 capsule by mouth daily.    Marland Kitchen levofloxacin (LEVAQUIN) 500 MG tablet Take 1 tablet (500 mg total) by mouth daily. 7 tablet 0  . lidocaine-prilocaine (EMLA) cream Apply 1  application topically daily as needed (port use).     . Linaclotide (LINZESS) 145 MCG CAPS capsule Take 145 mcg by mouth daily.    . meloxicam (MOBIC) 15 MG tablet Take 1 tablet by mouth at bedtime. Reported on 09/22/2015    . metFORMIN (GLUCOPHAGE) 500 MG tablet Take 500 mg by mouth 2 (two) times daily.    . pantoprazole (PROTONIX) 40 MG tablet Take 1 tablet by mouth daily.    . predniSONE (DELTASONE) 20 MG tablet Take 2 tablets (40 mg total) by mouth daily. 10 tablet 0  . saxagliptin HCl (ONGLYZA) 5 MG TABS tablet Take 5 mg by mouth daily. Reported on 09/22/2015    . SPIRIVA HANDIHALER 18 MCG inhalation capsule Place 1 capsule into inhaler and inhale daily.    . valsartan-hydrochlorothiazide (DIOVAN-HCT) 160-12.5 MG per tablet Take 1 tablet by mouth daily.     No current facility-administered medications for this visit.    Functional Status: In your present state of health, do you have any difficulty performing the following activities: 12/14/2015 11/20/2015  Hearing? N N  Vision? Y N  Difficulty concentrating or making decisions? N N  Walking or climbing stairs? N N  Dressing or bathing? N N  Doing errands, shopping? N N  Preparing Food and eating ? N -  Using the Toilet? N -  In the past six months, have you accidently leaked urine? N -  Do you have problems with loss of bowel control? N -  Managing  your Medications? N -  Managing your Finances? N -  Housekeeping or managing your Housekeeping? N -    Fall/Depression Screening: PHQ 2/9 Scores 01/04/2016 12/14/2015 11/26/2015 09/10/2015  PHQ - 2 Score 0 0 0 0    Assessment:  1) Medication assistance:  Called patient's pharmacy, [facility], with patient's permission to verify if there is any other fluid pill besides valsartan/hydrochlorothiazide or a Linzess prescription.   [facility] reports the valsartan/hydrochlorothiazide is the only fluid pill they have filled recently and that there is no prescription  for Linzess there.   Placed call to Dr Audree Bane office and was told that patient doesn't have any other fluid pill and that she was given samples of Amitiza to take, not Linzess.    Plan:  1) Counseled patient that valsartan/hydrochlorothiazide has two medications in it---a blood pressure pill (valsartan) and a diuretic (hydrochlorothiazide).    2) Advised patient to call her PCP to discuss using either Amitiza or Linzess---and to have PCP send prescription to her pharmacy given she has full extra help.    3) Recommend consideration to a repeat BMP to monitor renal function given recent dose increase to valsartan/hydrochlorothizide.    4)  Will call patient in 1-2 weeks to see if she was able to get her prescription and afford it.   Will route this initial note/assessment to patient's PCP.    Karrie Meres, PharmD, [place] [phone]

## 2016-01-06 DIAGNOSIS — R0602 Shortness of breath: Secondary | ICD-10-CM | POA: Diagnosis not present

## 2016-01-06 DIAGNOSIS — R918 Other nonspecific abnormal finding of lung field: Secondary | ICD-10-CM | POA: Diagnosis not present

## 2016-01-06 DIAGNOSIS — J449 Chronic obstructive pulmonary disease, unspecified: Secondary | ICD-10-CM | POA: Diagnosis not present

## 2016-01-06 DIAGNOSIS — J929 Pleural plaque without asbestos: Secondary | ICD-10-CM | POA: Diagnosis not present

## 2016-01-10 DIAGNOSIS — B370 Candidal stomatitis: Secondary | ICD-10-CM | POA: Diagnosis not present

## 2016-01-10 DIAGNOSIS — K5903 Drug induced constipation: Secondary | ICD-10-CM | POA: Diagnosis not present

## 2016-01-10 DIAGNOSIS — K219 Gastro-esophageal reflux disease without esophagitis: Secondary | ICD-10-CM | POA: Diagnosis not present

## 2016-01-10 DIAGNOSIS — R634 Abnormal weight loss: Secondary | ICD-10-CM | POA: Diagnosis not present

## 2016-01-10 DIAGNOSIS — Z1239 Encounter for other screening for malignant neoplasm of breast: Secondary | ICD-10-CM | POA: Diagnosis not present

## 2016-01-10 DIAGNOSIS — E119 Type 2 diabetes mellitus without complications: Secondary | ICD-10-CM | POA: Diagnosis not present

## 2016-01-10 DIAGNOSIS — J449 Chronic obstructive pulmonary disease, unspecified: Secondary | ICD-10-CM | POA: Diagnosis not present

## 2016-01-13 DIAGNOSIS — I5032 Chronic diastolic (congestive) heart failure: Secondary | ICD-10-CM | POA: Diagnosis not present

## 2016-01-13 DIAGNOSIS — E871 Hypo-osmolality and hyponatremia: Secondary | ICD-10-CM | POA: Diagnosis not present

## 2016-01-13 DIAGNOSIS — E860 Dehydration: Secondary | ICD-10-CM | POA: Diagnosis not present

## 2016-01-13 DIAGNOSIS — D72829 Elevated white blood cell count, unspecified: Secondary | ICD-10-CM | POA: Diagnosis not present

## 2016-01-13 DIAGNOSIS — I10 Essential (primary) hypertension: Secondary | ICD-10-CM | POA: Diagnosis not present

## 2016-01-13 DIAGNOSIS — J449 Chronic obstructive pulmonary disease, unspecified: Secondary | ICD-10-CM | POA: Diagnosis not present

## 2016-01-13 DIAGNOSIS — I509 Heart failure, unspecified: Secondary | ICD-10-CM | POA: Diagnosis not present

## 2016-01-13 DIAGNOSIS — N179 Acute kidney failure, unspecified: Secondary | ICD-10-CM | POA: Diagnosis not present

## 2016-01-13 DIAGNOSIS — E1165 Type 2 diabetes mellitus with hyperglycemia: Secondary | ICD-10-CM | POA: Diagnosis not present

## 2016-01-13 DIAGNOSIS — K5790 Diverticulosis of intestine, part unspecified, without perforation or abscess without bleeding: Secondary | ICD-10-CM | POA: Diagnosis not present

## 2016-01-13 DIAGNOSIS — N261 Atrophy of kidney (terminal): Secondary | ICD-10-CM | POA: Diagnosis not present

## 2016-01-14 ENCOUNTER — Other Ambulatory Visit: Payer: Self-pay | Admitting: Pharmacist

## 2016-01-14 ENCOUNTER — Encounter: Payer: Self-pay | Admitting: Pharmacist

## 2016-01-14 NOTE — Patient Outreach (Signed)
[place] [facility]) Care Management  01/14/2016  TEIGAN CARVER Dec 05, 1950 [id number]   [place] Pharmacist attempted to reach patient to follow-up if she followed-up with her PCP for her prescription for Linzess.  No answer, HIPAA compliant message left.     Will attempt to each patient within the next week.  Karrie Meres, PharmD, [place] [phone]

## 2016-01-14 NOTE — Patient Outreach (Signed)
[place] [facility]) Care Management  01/14/2016  COSETTE SURACE 06-29-1951 [id number]  Pharmacist placed call to patient to follow-up on medication assistance---patient was going to follow-up with her PCP regarding Linzess prescription.   No answer, left HIPAA compliant message.   Will make another attempt to reach patient within next week.  Karrie Meres, PharmD, [place] [phone]

## 2016-01-19 DIAGNOSIS — Z1231 Encounter for screening mammogram for malignant neoplasm of breast: Secondary | ICD-10-CM | POA: Diagnosis not present

## 2016-01-21 ENCOUNTER — Other Ambulatory Visit: Payer: Self-pay | Admitting: Pharmacist

## 2016-01-21 NOTE — Patient Outreach (Signed)
[place] [facility]) Care Management  01/21/2016  Erica [name] Jul 14, 1951 [id number]  [facility] Pharmacist called patient and patient verified name and date of birth.  Pharmacist was calling patient to follow-up her medication assistance needs and pharmacy needs.   Patient reports she is still using Linzess and reports she will be getting a prescription from her PCP for Linzess---she reports she has not done this yet since she has samples.    Given that patient was referred to [place] for patient assistance needs with Linzess and patient is full dual eligible with [employer] SNP, would not expect many patient assistance options.   As patient denies other pharmacy needs, will close out pharmacy case at this time.  Patient confirmed she has pharmacist phone number should something new arise.    Plan:    Will notify Jeannene Patella [facility] RN community care coordinator of pharmacy case closure via in-basket.  Would be happy to assist should new pharmacy needs arise in the future.   Karrie Meres, PharmD, [place] [phone]

## 2016-01-24 ENCOUNTER — Encounter: Payer: Self-pay | Admitting: Licensed Clinical Social Worker

## 2016-01-27 DIAGNOSIS — R922 Inconclusive mammogram: Secondary | ICD-10-CM | POA: Diagnosis not present

## 2016-02-05 DIAGNOSIS — J449 Chronic obstructive pulmonary disease, unspecified: Secondary | ICD-10-CM | POA: Diagnosis not present

## 2016-02-09 DIAGNOSIS — K5903 Drug induced constipation: Secondary | ICD-10-CM | POA: Diagnosis not present

## 2016-02-09 DIAGNOSIS — Z789 Other specified health status: Secondary | ICD-10-CM | POA: Diagnosis not present

## 2016-02-09 DIAGNOSIS — R634 Abnormal weight loss: Secondary | ICD-10-CM | POA: Diagnosis not present

## 2016-02-09 DIAGNOSIS — J449 Chronic obstructive pulmonary disease, unspecified: Secondary | ICD-10-CM | POA: Diagnosis not present

## 2016-02-09 DIAGNOSIS — Z720 Tobacco use: Secondary | ICD-10-CM | POA: Diagnosis not present

## 2016-02-16 DIAGNOSIS — J449 Chronic obstructive pulmonary disease, unspecified: Secondary | ICD-10-CM | POA: Diagnosis not present

## 2016-03-01 DIAGNOSIS — Z6827 Body mass index (BMI) 27.0-27.9, adult: Secondary | ICD-10-CM | POA: Diagnosis not present

## 2016-03-01 DIAGNOSIS — E118 Type 2 diabetes mellitus with unspecified complications: Secondary | ICD-10-CM | POA: Diagnosis not present

## 2016-03-01 DIAGNOSIS — J449 Chronic obstructive pulmonary disease, unspecified: Secondary | ICD-10-CM | POA: Diagnosis not present

## 2016-03-07 DIAGNOSIS — J449 Chronic obstructive pulmonary disease, unspecified: Secondary | ICD-10-CM | POA: Diagnosis not present

## 2016-03-09 DIAGNOSIS — K219 Gastro-esophageal reflux disease without esophagitis: Secondary | ICD-10-CM | POA: Diagnosis not present

## 2016-03-09 DIAGNOSIS — J449 Chronic obstructive pulmonary disease, unspecified: Secondary | ICD-10-CM | POA: Diagnosis not present

## 2016-03-09 DIAGNOSIS — J441 Chronic obstructive pulmonary disease with (acute) exacerbation: Secondary | ICD-10-CM | POA: Diagnosis not present

## 2016-04-06 DIAGNOSIS — J449 Chronic obstructive pulmonary disease, unspecified: Secondary | ICD-10-CM | POA: Diagnosis not present

## 2016-04-26 DIAGNOSIS — R0902 Hypoxemia: Secondary | ICD-10-CM | POA: Diagnosis not present

## 2016-04-26 DIAGNOSIS — E118 Type 2 diabetes mellitus with unspecified complications: Secondary | ICD-10-CM | POA: Diagnosis not present

## 2016-04-26 DIAGNOSIS — I10 Essential (primary) hypertension: Secondary | ICD-10-CM | POA: Diagnosis not present

## 2016-04-26 DIAGNOSIS — J449 Chronic obstructive pulmonary disease, unspecified: Secondary | ICD-10-CM | POA: Diagnosis not present

## 2016-04-26 DIAGNOSIS — F1120 Opioid dependence, uncomplicated: Secondary | ICD-10-CM | POA: Diagnosis not present

## 2016-04-27 DIAGNOSIS — J449 Chronic obstructive pulmonary disease, unspecified: Secondary | ICD-10-CM | POA: Diagnosis not present

## 2016-05-28 DIAGNOSIS — J449 Chronic obstructive pulmonary disease, unspecified: Secondary | ICD-10-CM | POA: Diagnosis not present

## 2016-06-06 DIAGNOSIS — Z79899 Other long term (current) drug therapy: Secondary | ICD-10-CM | POA: Diagnosis not present

## 2016-06-06 DIAGNOSIS — I10 Essential (primary) hypertension: Secondary | ICD-10-CM | POA: Diagnosis not present

## 2016-06-06 DIAGNOSIS — K219 Gastro-esophageal reflux disease without esophagitis: Secondary | ICD-10-CM | POA: Diagnosis not present

## 2016-06-06 DIAGNOSIS — E119 Type 2 diabetes mellitus without complications: Secondary | ICD-10-CM | POA: Diagnosis not present

## 2016-06-06 DIAGNOSIS — E782 Mixed hyperlipidemia: Secondary | ICD-10-CM | POA: Diagnosis not present

## 2016-06-06 DIAGNOSIS — J449 Chronic obstructive pulmonary disease, unspecified: Secondary | ICD-10-CM | POA: Diagnosis not present

## 2016-06-07 DIAGNOSIS — R0602 Shortness of breath: Secondary | ICD-10-CM | POA: Diagnosis not present

## 2016-06-07 DIAGNOSIS — D869 Sarcoidosis, unspecified: Secondary | ICD-10-CM | POA: Diagnosis not present

## 2016-06-07 DIAGNOSIS — R0902 Hypoxemia: Secondary | ICD-10-CM | POA: Diagnosis not present

## 2016-06-07 DIAGNOSIS — J984 Other disorders of lung: Secondary | ICD-10-CM | POA: Diagnosis not present

## 2016-06-07 DIAGNOSIS — Z720 Tobacco use: Secondary | ICD-10-CM | POA: Diagnosis not present

## 2016-06-07 DIAGNOSIS — R938 Abnormal findings on diagnostic imaging of other specified body structures: Secondary | ICD-10-CM | POA: Diagnosis not present

## 2016-06-07 DIAGNOSIS — R0689 Other abnormalities of breathing: Secondary | ICD-10-CM | POA: Diagnosis not present

## 2016-06-07 DIAGNOSIS — R942 Abnormal results of pulmonary function studies: Secondary | ICD-10-CM | POA: Diagnosis not present

## 2016-06-12 DIAGNOSIS — R0902 Hypoxemia: Secondary | ICD-10-CM | POA: Diagnosis not present

## 2016-06-27 DIAGNOSIS — J449 Chronic obstructive pulmonary disease, unspecified: Secondary | ICD-10-CM | POA: Diagnosis not present

## 2016-07-28 DIAGNOSIS — J449 Chronic obstructive pulmonary disease, unspecified: Secondary | ICD-10-CM | POA: Diagnosis not present

## 2016-08-03 DIAGNOSIS — Z720 Tobacco use: Secondary | ICD-10-CM | POA: Diagnosis not present

## 2016-08-03 DIAGNOSIS — R0689 Other abnormalities of breathing: Secondary | ICD-10-CM | POA: Diagnosis not present

## 2016-08-03 DIAGNOSIS — J984 Other disorders of lung: Secondary | ICD-10-CM | POA: Diagnosis not present

## 2016-08-03 DIAGNOSIS — R0602 Shortness of breath: Secondary | ICD-10-CM | POA: Diagnosis not present

## 2016-08-03 DIAGNOSIS — R0902 Hypoxemia: Secondary | ICD-10-CM | POA: Diagnosis not present

## 2016-08-03 DIAGNOSIS — D869 Sarcoidosis, unspecified: Secondary | ICD-10-CM | POA: Diagnosis not present

## 2016-08-03 DIAGNOSIS — Z23 Encounter for immunization: Secondary | ICD-10-CM | POA: Diagnosis not present

## 2016-08-03 DIAGNOSIS — J929 Pleural plaque without asbestos: Secondary | ICD-10-CM | POA: Diagnosis not present

## 2016-08-27 DIAGNOSIS — J449 Chronic obstructive pulmonary disease, unspecified: Secondary | ICD-10-CM | POA: Diagnosis not present

## 2016-09-07 DIAGNOSIS — R0689 Other abnormalities of breathing: Secondary | ICD-10-CM | POA: Diagnosis not present

## 2016-09-07 DIAGNOSIS — R938 Abnormal findings on diagnostic imaging of other specified body structures: Secondary | ICD-10-CM | POA: Diagnosis not present

## 2016-09-07 DIAGNOSIS — Z79899 Other long term (current) drug therapy: Secondary | ICD-10-CM | POA: Diagnosis not present

## 2016-09-07 DIAGNOSIS — R0902 Hypoxemia: Secondary | ICD-10-CM | POA: Diagnosis not present

## 2016-09-07 DIAGNOSIS — J449 Chronic obstructive pulmonary disease, unspecified: Secondary | ICD-10-CM | POA: Diagnosis not present

## 2016-09-07 DIAGNOSIS — R0602 Shortness of breath: Secondary | ICD-10-CM | POA: Diagnosis not present

## 2016-09-07 DIAGNOSIS — J984 Other disorders of lung: Secondary | ICD-10-CM | POA: Diagnosis not present

## 2016-09-07 DIAGNOSIS — D869 Sarcoidosis, unspecified: Secondary | ICD-10-CM | POA: Diagnosis not present

## 2016-09-07 DIAGNOSIS — Z720 Tobacco use: Secondary | ICD-10-CM | POA: Diagnosis not present

## 2016-09-07 DIAGNOSIS — K219 Gastro-esophageal reflux disease without esophagitis: Secondary | ICD-10-CM | POA: Diagnosis not present

## 2016-09-07 DIAGNOSIS — E119 Type 2 diabetes mellitus without complications: Secondary | ICD-10-CM | POA: Diagnosis not present

## 2016-09-07 DIAGNOSIS — I10 Essential (primary) hypertension: Secondary | ICD-10-CM | POA: Diagnosis not present

## 2016-09-07 DIAGNOSIS — E782 Mixed hyperlipidemia: Secondary | ICD-10-CM | POA: Diagnosis not present

## 2016-09-12 DIAGNOSIS — E782 Mixed hyperlipidemia: Secondary | ICD-10-CM | POA: Diagnosis not present

## 2016-09-12 DIAGNOSIS — J449 Chronic obstructive pulmonary disease, unspecified: Secondary | ICD-10-CM | POA: Diagnosis not present

## 2016-09-12 DIAGNOSIS — K219 Gastro-esophageal reflux disease without esophagitis: Secondary | ICD-10-CM | POA: Diagnosis not present

## 2016-09-12 DIAGNOSIS — I10 Essential (primary) hypertension: Secondary | ICD-10-CM | POA: Diagnosis not present

## 2016-09-12 DIAGNOSIS — G8929 Other chronic pain: Secondary | ICD-10-CM | POA: Diagnosis not present

## 2016-09-12 DIAGNOSIS — E119 Type 2 diabetes mellitus without complications: Secondary | ICD-10-CM | POA: Diagnosis not present

## 2016-09-12 DIAGNOSIS — M5441 Lumbago with sciatica, right side: Secondary | ICD-10-CM | POA: Diagnosis not present

## 2016-09-12 DIAGNOSIS — M5442 Lumbago with sciatica, left side: Secondary | ICD-10-CM | POA: Diagnosis not present

## 2016-09-12 DIAGNOSIS — Z79899 Other long term (current) drug therapy: Secondary | ICD-10-CM | POA: Diagnosis not present

## 2016-09-25 DIAGNOSIS — J449 Chronic obstructive pulmonary disease, unspecified: Secondary | ICD-10-CM | POA: Diagnosis not present

## 2016-09-27 DIAGNOSIS — J449 Chronic obstructive pulmonary disease, unspecified: Secondary | ICD-10-CM | POA: Diagnosis not present

## 2016-10-20 DIAGNOSIS — J449 Chronic obstructive pulmonary disease, unspecified: Secondary | ICD-10-CM | POA: Diagnosis not present

## 2016-10-20 DIAGNOSIS — J441 Chronic obstructive pulmonary disease with (acute) exacerbation: Secondary | ICD-10-CM | POA: Diagnosis not present

## 2016-10-23 DIAGNOSIS — Z0100 Encounter for examination of eyes and vision without abnormal findings: Secondary | ICD-10-CM | POA: Diagnosis not present

## 2016-10-23 DIAGNOSIS — H25013 Cortical age-related cataract, bilateral: Secondary | ICD-10-CM | POA: Diagnosis not present

## 2016-10-23 DIAGNOSIS — E119 Type 2 diabetes mellitus without complications: Secondary | ICD-10-CM | POA: Diagnosis not present

## 2016-10-23 DIAGNOSIS — H524 Presbyopia: Secondary | ICD-10-CM | POA: Diagnosis not present

## 2016-10-28 DIAGNOSIS — J449 Chronic obstructive pulmonary disease, unspecified: Secondary | ICD-10-CM | POA: Diagnosis not present

## 2016-11-07 DIAGNOSIS — R21 Rash and other nonspecific skin eruption: Secondary | ICD-10-CM | POA: Diagnosis not present

## 2016-11-07 DIAGNOSIS — R319 Hematuria, unspecified: Secondary | ICD-10-CM | POA: Diagnosis not present

## 2016-11-25 DIAGNOSIS — J449 Chronic obstructive pulmonary disease, unspecified: Secondary | ICD-10-CM | POA: Diagnosis not present

## 2016-12-07 DIAGNOSIS — E782 Mixed hyperlipidemia: Secondary | ICD-10-CM | POA: Diagnosis not present

## 2016-12-07 DIAGNOSIS — E119 Type 2 diabetes mellitus without complications: Secondary | ICD-10-CM | POA: Diagnosis not present

## 2016-12-07 DIAGNOSIS — J449 Chronic obstructive pulmonary disease, unspecified: Secondary | ICD-10-CM | POA: Diagnosis not present

## 2016-12-07 DIAGNOSIS — I10 Essential (primary) hypertension: Secondary | ICD-10-CM | POA: Diagnosis not present

## 2016-12-07 DIAGNOSIS — Z79899 Other long term (current) drug therapy: Secondary | ICD-10-CM | POA: Diagnosis not present

## 2016-12-07 DIAGNOSIS — Z1159 Encounter for screening for other viral diseases: Secondary | ICD-10-CM | POA: Diagnosis not present

## 2016-12-12 DIAGNOSIS — J449 Chronic obstructive pulmonary disease, unspecified: Secondary | ICD-10-CM | POA: Diagnosis not present

## 2016-12-12 DIAGNOSIS — Z13820 Encounter for screening for osteoporosis: Secondary | ICD-10-CM | POA: Diagnosis not present

## 2016-12-12 DIAGNOSIS — E119 Type 2 diabetes mellitus without complications: Secondary | ICD-10-CM | POA: Diagnosis not present

## 2016-12-12 DIAGNOSIS — Z79899 Other long term (current) drug therapy: Secondary | ICD-10-CM | POA: Diagnosis not present

## 2016-12-12 DIAGNOSIS — I10 Essential (primary) hypertension: Secondary | ICD-10-CM | POA: Diagnosis not present

## 2016-12-12 DIAGNOSIS — E782 Mixed hyperlipidemia: Secondary | ICD-10-CM | POA: Diagnosis not present

## 2016-12-18 ENCOUNTER — Encounter (HOSPITAL_COMMUNITY): Payer: Self-pay | Admitting: Emergency Medicine

## 2016-12-18 ENCOUNTER — Emergency Department (HOSPITAL_COMMUNITY)
Admission: EM | Admit: 2016-12-18 | Discharge: 2016-12-18 | Disposition: A | Discharge status: Home / Self Care | Payer: Medicare HMO | Attending: Emergency Medicine | Admitting: Emergency Medicine

## 2016-12-18 ENCOUNTER — Emergency Department (HOSPITAL_COMMUNITY): Payer: Medicare HMO

## 2016-12-18 DIAGNOSIS — Z7982 Long term (current) use of aspirin: Secondary | ICD-10-CM | POA: Insufficient documentation

## 2016-12-18 DIAGNOSIS — J069 Acute upper respiratory infection, unspecified: Secondary | ICD-10-CM | POA: Insufficient documentation

## 2016-12-18 DIAGNOSIS — Z7984 Long term (current) use of oral hypoglycemic drugs: Secondary | ICD-10-CM | POA: Insufficient documentation

## 2016-12-18 DIAGNOSIS — I10 Essential (primary) hypertension: Secondary | ICD-10-CM | POA: Insufficient documentation

## 2016-12-18 DIAGNOSIS — R0602 Shortness of breath: Secondary | ICD-10-CM | POA: Diagnosis not present

## 2016-12-18 DIAGNOSIS — Z79899 Other long term (current) drug therapy: Secondary | ICD-10-CM | POA: Insufficient documentation

## 2016-12-18 DIAGNOSIS — J441 Chronic obstructive pulmonary disease with (acute) exacerbation: Secondary | ICD-10-CM | POA: Insufficient documentation

## 2016-12-18 DIAGNOSIS — R05 Cough: Secondary | ICD-10-CM | POA: Diagnosis not present

## 2016-12-18 DIAGNOSIS — E119 Type 2 diabetes mellitus without complications: Secondary | ICD-10-CM | POA: Diagnosis not present

## 2016-12-18 DIAGNOSIS — F17210 Nicotine dependence, cigarettes, uncomplicated: Secondary | ICD-10-CM | POA: Insufficient documentation

## 2016-12-18 LAB — CBC WITH DIFFERENTIAL/PLATELET
Basophils Absolute: 0 K/uL (ref 0.0–0.1)
Basophils Relative: 0 %
Eosinophils Absolute: 0.3 K/uL (ref 0.0–0.7)
Eosinophils Relative: 3 %
HCT: 39.8 % (ref 36.0–46.0)
Hemoglobin: 13.3 g/dL (ref 12.0–15.0)
Lymphocytes Relative: 34 %
Lymphs Abs: 2.9 K/uL (ref 0.7–4.0)
MCH: 27.5 pg (ref 26.0–34.0)
MCHC: 33.4 g/dL (ref 30.0–36.0)
MCV: 82.4 fL (ref 78.0–100.0)
Monocytes Absolute: 0.7 K/uL (ref 0.1–1.0)
Monocytes Relative: 8 %
Neutro Abs: 4.6 K/uL (ref 1.7–7.7)
Neutrophils Relative %: 55 %
Platelets: 280 K/uL (ref 150–400)
RBC: 4.83 MIL/uL (ref 3.87–5.11)
RDW: 15 % (ref 11.5–15.5)
WBC: 8.6 K/uL (ref 4.0–10.5)

## 2016-12-18 LAB — COMPREHENSIVE METABOLIC PANEL WITH GFR
ALT: 10 U/L — ABNORMAL LOW (ref 14–54)
AST: 13 U/L — ABNORMAL LOW (ref 15–41)
Albumin: 3.6 g/dL (ref 3.5–5.0)
Alkaline Phosphatase: 81 U/L (ref 38–126)
Anion gap: 9 (ref 5–15)
BUN: 18 mg/dL (ref 6–20)
CO2: 28 mmol/L (ref 22–32)
Calcium: 9.5 mg/dL (ref 8.9–10.3)
Chloride: 103 mmol/L (ref 101–111)
Creatinine, Ser: 1.4 mg/dL — ABNORMAL HIGH (ref 0.44–1.00)
GFR calc Af Amer: 44 mL/min — ABNORMAL LOW (ref >60)
GFR calc non Af Amer: 38 mL/min — ABNORMAL LOW (ref >60)
Glucose, Bld: 128 mg/dL — ABNORMAL HIGH (ref 65–99)
Potassium: 4.1 mmol/L (ref 3.5–5.1)
Sodium: 140 mmol/L (ref 135–145)
Total Bilirubin: 0.6 mg/dL (ref 0.3–1.2)
Total Protein: 8 g/dL (ref 6.5–8.1)

## 2016-12-18 LAB — TROPONIN I: Troponin I: <0.03 ng/mL (ref <0.03)

## 2016-12-18 MED ORDER — AZITHROMYCIN 250 MG PO TABS
500.0000 mg | ORAL_TABLET | Freq: Once | ORAL | Status: AC
  Administered 2016-12-18: 500 mg via ORAL
  Filled 2016-12-18: qty 2

## 2016-12-18 MED ORDER — PREDNISONE 20 MG PO TABS
20.0000 mg | ORAL_TABLET | Freq: Once | ORAL | Status: AC
  Administered 2016-12-18: 20 mg via ORAL
  Filled 2016-12-18: qty 1

## 2016-12-18 MED ORDER — PREDNISONE 10 MG PO TABS: 20.0000 mg | ORAL_TABLET | Freq: Every day | ORAL | 0 refills | Status: DC

## 2016-12-18 MED ORDER — SODIUM CHLORIDE 0.9 % IV BOLUS (SEPSIS): 1000.0000 mL | Freq: Once | INTRAVENOUS | Status: DC

## 2016-12-18 MED ORDER — IPRATROPIUM-ALBUTEROL 0.5-2.5 (3) MG/3ML IN SOLN
3.0000 mL | Freq: Once | RESPIRATORY_TRACT | Status: AC
  Administered 2016-12-18: 3 mL via RESPIRATORY_TRACT
  Filled 2016-12-18: qty 3

## 2016-12-18 MED ORDER — AZITHROMYCIN 250 MG PO TABS: ORAL_TABLET | ORAL | 0 refills | Status: DC

## 2016-12-18 MED ORDER — HYDROCOD POLST-CPM POLST ER 10-8 MG/5ML PO SUER: 5.0000 mL | Freq: Two times a day (BID) | ORAL | 0 refills | Status: DC | PRN

## 2016-12-18 MED ORDER — HYDROCOD POLST-CPM POLST ER 10-8 MG/5ML PO SUER
5.0000 mL | Freq: Once | ORAL | Status: AC
  Administered 2016-12-18: 5 mL via ORAL
  Filled 2016-12-18: qty 5

## 2016-12-18 NOTE — ED Notes (Signed)
ED Provider at bedside. 

## 2016-12-18 NOTE — Discharge Instructions (Signed)
Chest x-ray showed no pneumonia. I would recommend using your nebulizer machine. Prescription for antibiotic, prednisone, cough syrup.

## 2016-12-18 NOTE — ED Provider Notes (Signed)
[facility] DEPT Provider Note   CSN: [id number] Arrival date & time: 12/18/16  1556     History   Chief Complaint Chief Complaint  Patient presents with  . Shortness of Breath    HPI Erica [name] is a 66 y.o. female.  Patient with known COPD and oxygen dependency presents with productive cough with yellow sputum, wheezing, dyspnea. She is normally on 2 L of oxygen at home. No fever, sweats, chills, rusty sputum. She has not been using her home nebulizer machine. Severity of symptoms is moderate. Nothing makes symptoms better or worse      Past Medical History:  Diagnosis Date  . Chronic back pain   . COPD (chronic obstructive pulmonary disease) ([place])   . Diabetes mellitus without complication ([place])   . Hypertension   . Oxygen deficiency     Patient Active Problem List   Diagnosis Date Noted  . Acute respiratory failure with hypoxia ([place]) 11/20/2015  . COPD exacerbation ([place]) 11/20/2015  . Benign essential HTN 11/20/2015  . GERD (gastroesophageal reflux disease) 11/20/2015  . Tobacco abuse 11/20/2015  . Type 2 diabetes mellitus with hyperglycemia ([place]) 11/20/2015  . COPD exacerbation ([place]) 09/05/2015  . Chronic pain syndrome 09/05/2015  . Hypertension 09/05/2015  . Diabetes mellitus type 2, controlled ([place]) 09/05/2015  . Chronic respiratory failure ([place]) 09/05/2015  . OLECRANON BURSITIS, LEFT 08/04/2010    Past Surgical History:  Procedure Laterality Date  . ABDOMINAL HYSTERECTOMY      OB History    Gravida Para Term Preterm AB Living   0 0 0 0 0     SAB TAB Ectopic Multiple Live Births   0 0 0           Home Medications    Prior to Admission medications   Medication Sig Start Date End Date Taking? Authorizing Provider  albuterol (PROVENTIL) (2.5 MG/3ML) 0.083% nebulizer solution Take 2.5 mg by nebulization every 6 (six) hours as needed for wheezing or shortness of breath.  09/09/15  Yes Historical Provider, MD  albuterol-ipratropium  (COMBIVENT) 18-103 MCG/ACT inhaler Inhale 1 puff into the lungs every 6 (six) hours as needed for wheezing or shortness of breath. 11/23/15  Yes Barton Dubois, MD  aspirin 81 MG tablet Take 2 tablets by mouth daily.  10/22/15  Yes Historical Provider, MD  atorvastatin (LIPITOR) 40 MG tablet Take 40 mg by mouth daily. 12/04/16  Yes Historical Provider, MD  budesonide-formoterol (SYMBICORT) 160-4.5 MCG/ACT inhaler Inhale 2 puffs into the lungs 2 (two) times daily. 11/23/15  Yes Barton Dubois, MD  buPROPion [facility] SR) 150 MG 12 hr tablet Take 150 mg by mouth 2 (two) times daily. 12/04/16  Yes Historical Provider, MD  famotidine (PEPCID) 20 MG tablet Take 1 tablet by mouth 2 (two) times daily. 09/21/15  Yes Historical Provider, MD  gabapentin (NEURONTIN) 100 MG capsule Take 1 capsule by mouth daily. 11/05/13  Yes Historical Provider, MD  Linaclotide Rolan Lipa) 145 MCG CAPS capsule Take 145 mcg by mouth daily.   Yes Historical Provider, MD  meloxicam (MOBIC) 15 MG tablet Take 1 tablet by mouth daily. Reported on 09/22/2015 11/11/13  Yes Historical Provider, MD  metFORMIN (GLUCOPHAGE-XR) 500 MG 24 hr tablet Take 500 mg by mouth 2 (two) times daily. 12/04/16  Yes Historical Provider, MD  omega-3 acid ethyl esters (LOVAZA) 1 g capsule Take 1 capsule by mouth 4 (four) times daily. 12/07/16  Yes Historical Provider, MD  omeprazole (PRILOSEC) 20 MG capsule Take 20 mg by mouth  daily. 12/04/16  Yes Historical Provider, MD  saxagliptin HCl (ONGLYZA) 5 MG TABS tablet Take 5 mg by mouth daily. Reported on 09/22/2015   Yes Historical Provider, MD  SPIRIVA HANDIHALER 18 MCG inhalation capsule Place 1 capsule into inhaler and inhale daily. 09/22/15  Yes Historical Provider, MD  valsartan-hydrochlorothiazide (DIOVAN-HCT) 160-12.5 MG per tablet Take 1 tablet by mouth daily.   Yes Historical Provider, MD  azithromycin (ZITHROMAX) 250 MG tablet 1 tablet daily starting Tuesday evening 12/18/16   Nat Christen, MD    chlorpheniramine-HYDROcodone [facility]) 10-8 MG/5ML SUER Take 5 mLs by mouth every 12 (twelve) hours as needed for cough. 12/18/16   Nat Christen, MD  predniSONE (DELTASONE) 10 MG tablet Take 2 tablets (20 mg total) by mouth daily. 12/18/16   Nat Christen, MD    Family History History reviewed. No pertinent family history.  Social History Social History  Substance Use Topics  . Smoking status: Current Every Day Smoker    Packs/day: 0.50    Years: 16.00    Types: Cigarettes  . Smokeless tobacco: Never Used  . Alcohol use No     Allergies   Patient has no known allergies.   Review of Systems Review of Systems  All other systems reviewed and are negative.    Physical Exam Updated Vital Signs BP (!) 161/83 (BP Location: Left Arm)   Pulse (!) 102   Temp 98.4 F (36.9 C) (Oral)   Resp 20   Ht 5\' 4"  (1.626 m)   Wt 180 lb (81.6 kg)   SpO2 96%   BMI 30.90 kg/m   Physical Exam  Constitutional: She is oriented to person, place, and time. She appears well-developed and well-nourished.  No acute distress.  HENT:  Head: Normocephalic and atraumatic.  Eyes: Conjunctivae are normal.  Neck: Neck supple.  Cardiovascular: Normal rate and regular rhythm.   Pulmonary/Chest:  Bilateral expiratory wheeze  Abdominal: Soft. Bowel sounds are normal.  Musculoskeletal: Normal range of motion.  Neurological: She is alert and oriented to person, place, and time.  Skin: Skin is warm and dry.  Psychiatric: She has a normal mood and affect. Her behavior is normal.  Nursing note and vitals reviewed.    ED Treatments / Results  Labs (all labs ordered are listed, but only abnormal results are displayed) Labs Reviewed  COMPREHENSIVE METABOLIC PANEL - Abnormal; Notable for the following:       Result Value   Glucose, Bld 128 (*)    Creatinine, Ser 1.40 (*)    AST 13 (*)    ALT 10 (*)    GFR calc non Af Amer 38 (*)    GFR calc Af Amer 44 (*)    All other components  within normal limits  TROPONIN I  CBC WITH DIFFERENTIAL/PLATELET    EKG  EKG Interpretation None       Radiology Dg Chest 2 View  Result Date: 12/18/2016 CLINICAL DATA:  Productive cough. EXAM: CHEST  2 VIEW COMPARISON:  01/04/2016.  09/05/2015.  08/30/2010. FINDINGS: Mediastinum and hilar structures normal. Heart size normal. Diffuse bilateral from interstitial prominence noted consistent with interstitial fibrosis. These changes are stable. Stable bilateral pleural-parenchymal thickening noted consistent with scarring. IMPRESSION: 1. Stable chronic interstitial lung disease and bilateral pleuroparenchymal scarring. No acute focal alveolar infiltrate. 2. Stable cardiomegaly . Electronically Signed   By: Marcello Moores  Register   On: 12/18/2016 16:24    Procedures Procedures (including critical care time)  Medications Ordered in ED Medications  chlorpheniramine-HYDROcodone (TUSSIONEX) 10-8 MG/5ML suspension 5 mL (not administered)  sodium chloride 0.9 % bolus 1,000 mL (not administered)  ipratropium-albuterol (DUONEB) 0.5-2.5 (3) MG/3ML nebulizer solution 3 mL (3 mLs Nebulization Given 12/18/16 2010)  azithromycin (ZITHROMAX) tablet 500 mg (500 mg Oral Given 12/18/16 2000)  predniSONE (DELTASONE) tablet 20 mg (20 mg Oral Given 12/18/16 2000)     Initial Impression / Assessment and Plan / ED Course  I have reviewed the triage vital signs and the nursing notes.  Pertinent labs & imaging results that were available during my care of the patient were reviewed by me and considered in my medical decision making (see chart for details).     Patient has known COPD and is oxygen dependent. Chest x-ray shows no pneumonia. She's feels better after nebulizer treatments. Discharge medications include Zithromax, prednisone, Tussionex.  Final Clinical Impressions(s) / ED Diagnoses   Final diagnoses:  Upper respiratory tract infection, unspecified type    New Prescriptions New Prescriptions    AZITHROMYCIN (ZITHROMAX) 250 MG TABLET    1 tablet daily starting Tuesday evening   CHLORPHENIRAMINE-HYDROCODONE (TUSSIONEX PENNKINETIC ER) 10-8 MG/5ML SUER    Take 5 mLs by mouth every 12 (twelve) hours as needed for cough.   PREDNISONE (DELTASONE) 10 MG TABLET    Take 2 tablets (20 mg total) by mouth daily.     Nat Christen, MD 12/18/16 2208

## 2016-12-18 NOTE — ED Triage Notes (Signed)
PT c/o SOB on exertion over the past week with productive yellow sputum cough and states has been wearing her night time oxygen daily over the past few days as well.

## 2016-12-18 NOTE — ED Notes (Signed)
Called patient to obtain vital signs not received during triage. Patient ambulated to desk in waiting room and sat down in chair. Patient's O2 saturation 79% on room air. Patient complaining of shortness of breath. States "I'm normally on oxygen all the time, 2 L, but my portable tank is out." NAD noted. Patient able to talk in full sentences. Retrieved oxygen tank and nasal canula to place patient on hospital O2. Upon returning to patient, patient's O2 saturation noted to be 90% on room air. States "It's because I rested." Placed patient on 2L O2 via nasal canula and returned patient to waiting room area. Advised patient to make this nurse aware of any worsening conditions.

## 2016-12-26 DIAGNOSIS — J449 Chronic obstructive pulmonary disease, unspecified: Secondary | ICD-10-CM | POA: Diagnosis not present

## 2016-12-27 DIAGNOSIS — E2839 Other primary ovarian failure: Secondary | ICD-10-CM | POA: Diagnosis not present

## 2017-01-17 DIAGNOSIS — R062 Wheezing: Secondary | ICD-10-CM | POA: Diagnosis not present

## 2017-01-17 DIAGNOSIS — J441 Chronic obstructive pulmonary disease with (acute) exacerbation: Secondary | ICD-10-CM | POA: Diagnosis not present

## 2017-01-22 DIAGNOSIS — E118 Type 2 diabetes mellitus with unspecified complications: Secondary | ICD-10-CM | POA: Diagnosis not present

## 2017-01-22 DIAGNOSIS — J449 Chronic obstructive pulmonary disease, unspecified: Secondary | ICD-10-CM | POA: Diagnosis not present

## 2017-01-23 DIAGNOSIS — J449 Chronic obstructive pulmonary disease, unspecified: Secondary | ICD-10-CM | POA: Diagnosis not present

## 2017-01-25 DIAGNOSIS — J449 Chronic obstructive pulmonary disease, unspecified: Secondary | ICD-10-CM | POA: Diagnosis not present

## 2017-02-11 ENCOUNTER — Emergency Department (HOSPITAL_COMMUNITY): Payer: Medicare Other

## 2017-02-11 ENCOUNTER — Inpatient Hospital Stay (HOSPITAL_COMMUNITY)
Admission: EM | Admit: 2017-02-11 | Discharge: 2017-02-13 | DRG: 190 | Disposition: A | Discharge status: Home / Self Care | Payer: Medicare Other | Attending: Family Medicine | Admitting: Family Medicine

## 2017-02-11 ENCOUNTER — Encounter (HOSPITAL_COMMUNITY): Payer: Self-pay | Admitting: Emergency Medicine

## 2017-02-11 DIAGNOSIS — Z7984 Long term (current) use of oral hypoglycemic drugs: Secondary | ICD-10-CM | POA: Diagnosis not present

## 2017-02-11 DIAGNOSIS — Z7982 Long term (current) use of aspirin: Secondary | ICD-10-CM

## 2017-02-11 DIAGNOSIS — J9621 Acute and chronic respiratory failure with hypoxia: Secondary | ICD-10-CM | POA: Diagnosis not present

## 2017-02-11 DIAGNOSIS — J9622 Acute and chronic respiratory failure with hypercapnia: Secondary | ICD-10-CM | POA: Diagnosis present

## 2017-02-11 DIAGNOSIS — N179 Acute kidney failure, unspecified: Secondary | ICD-10-CM | POA: Diagnosis not present

## 2017-02-11 DIAGNOSIS — Z66 Do not resuscitate: Secondary | ICD-10-CM | POA: Diagnosis not present

## 2017-02-11 DIAGNOSIS — K219 Gastro-esophageal reflux disease without esophagitis: Secondary | ICD-10-CM | POA: Diagnosis present

## 2017-02-11 DIAGNOSIS — Z720 Tobacco use: Secondary | ICD-10-CM | POA: Diagnosis not present

## 2017-02-11 DIAGNOSIS — J9620 Acute and chronic respiratory failure, unspecified whether with hypoxia or hypercapnia: Secondary | ICD-10-CM | POA: Diagnosis not present

## 2017-02-11 DIAGNOSIS — E1165 Type 2 diabetes mellitus with hyperglycemia: Secondary | ICD-10-CM | POA: Diagnosis not present

## 2017-02-11 DIAGNOSIS — E119 Type 2 diabetes mellitus without complications: Secondary | ICD-10-CM | POA: Diagnosis not present

## 2017-02-11 DIAGNOSIS — R0600 Dyspnea, unspecified: Secondary | ICD-10-CM

## 2017-02-11 DIAGNOSIS — I5033 Acute on chronic diastolic (congestive) heart failure: Secondary | ICD-10-CM | POA: Diagnosis not present

## 2017-02-11 DIAGNOSIS — Z7951 Long term (current) use of inhaled steroids: Secondary | ICD-10-CM | POA: Diagnosis not present

## 2017-02-11 DIAGNOSIS — I110 Hypertensive heart disease with heart failure: Secondary | ICD-10-CM | POA: Diagnosis not present

## 2017-02-11 DIAGNOSIS — Z9981 Dependence on supplemental oxygen: Secondary | ICD-10-CM | POA: Diagnosis not present

## 2017-02-11 DIAGNOSIS — I10 Essential (primary) hypertension: Secondary | ICD-10-CM | POA: Diagnosis present

## 2017-02-11 DIAGNOSIS — F17210 Nicotine dependence, cigarettes, uncomplicated: Secondary | ICD-10-CM | POA: Diagnosis present

## 2017-02-11 DIAGNOSIS — J441 Chronic obstructive pulmonary disease with (acute) exacerbation: Secondary | ICD-10-CM | POA: Diagnosis not present

## 2017-02-11 DIAGNOSIS — R0602 Shortness of breath: Secondary | ICD-10-CM | POA: Diagnosis not present

## 2017-02-11 HISTORY — DX: Heart failure, unspecified: I50.9

## 2017-02-11 LAB — BASIC METABOLIC PANEL
ANION GAP: 9 (ref 5–15)
BUN: 14 mg/dL (ref 6–20)
CALCIUM: 9.3 mg/dL (ref 8.9–10.3)
CO2: 28 mmol/L (ref 22–32)
CREATININE: 1.21 mg/dL — AB (ref 0.44–1.00)
Chloride: 104 mmol/L (ref 101–111)
GFR calc Af Amer: 52 mL/min — ABNORMAL LOW (ref >60)
GFR, EST NON AFRICAN AMERICAN: 45 mL/min — AB (ref >60)
GLUCOSE: 123 mg/dL — AB (ref 65–99)
Potassium: 4 mmol/L (ref 3.5–5.1)
Sodium: 141 mmol/L (ref 135–145)

## 2017-02-11 LAB — CBC WITH DIFFERENTIAL/PLATELET
BASOS ABS: 0 10*3/uL (ref 0.0–0.1)
BASOS PCT: 0 %
Eosinophils Absolute: 0.2 10*3/uL (ref 0.0–0.7)
Eosinophils Relative: 3 %
HEMATOCRIT: 36.6 % (ref 36.0–46.0)
Hemoglobin: 12.1 g/dL (ref 12.0–15.0)
Lymphocytes Relative: 29 %
Lymphs Abs: 1.7 10*3/uL (ref 0.7–4.0)
MCH: 26.9 pg (ref 26.0–34.0)
MCHC: 33.1 g/dL (ref 30.0–36.0)
MCV: 81.5 fL (ref 78.0–100.0)
Monocytes Absolute: 0.6 10*3/uL (ref 0.1–1.0)
Monocytes Relative: 11 %
NEUTROS ABS: 3.3 10*3/uL (ref 1.7–7.7)
NEUTROS PCT: 57 %
PLATELETS: 223 10*3/uL (ref 150–400)
RBC: 4.49 MIL/uL (ref 3.87–5.11)
RDW: 15.7 % — AB (ref 11.5–15.5)
WBC: 5.8 10*3/uL (ref 4.0–10.5)

## 2017-02-11 LAB — GLUCOSE, CAPILLARY: Glucose-Capillary: 224 mg/dL — ABNORMAL HIGH (ref 65–99)

## 2017-02-11 LAB — BRAIN NATRIURETIC PEPTIDE: B NATRIURETIC PEPTIDE 5: 243 pg/mL — AB (ref 0.0–100.0)

## 2017-02-11 MED ORDER — ENOXAPARIN SODIUM 40 MG/0.4ML ~~LOC~~ SOLN
40.0000 mg | SUBCUTANEOUS | Status: DC
  Administered 2017-02-11 – 2017-02-12 (×2): 40 mg via SUBCUTANEOUS
  Filled 2017-02-11 (×2): qty 0.4

## 2017-02-11 MED ORDER — PREDNISONE 20 MG PO TABS
40.0000 mg | ORAL_TABLET | Freq: Once | ORAL | Status: AC
  Administered 2017-02-11: 40 mg via ORAL
  Filled 2017-02-11: qty 2

## 2017-02-11 MED ORDER — IRBESARTAN 150 MG PO TABS
150.0000 mg | ORAL_TABLET | Freq: Every day | ORAL | Status: DC
  Administered 2017-02-12 – 2017-02-13 (×3): 150 mg via ORAL
  Filled 2017-02-11 (×3): qty 1

## 2017-02-11 MED ORDER — DOXYCYCLINE HYCLATE 100 MG PO TABS
100.0000 mg | ORAL_TABLET | Freq: Two times a day (BID) | ORAL | Status: DC
  Administered 2017-02-11 – 2017-02-13 (×4): 100 mg via ORAL
  Filled 2017-02-11 (×4): qty 1

## 2017-02-11 MED ORDER — VALSARTAN-HYDROCHLOROTHIAZIDE 160-12.5 MG PO TABS: 1.0000 | ORAL_TABLET | Freq: Every day | ORAL | Status: DC

## 2017-02-11 MED ORDER — LINACLOTIDE 145 MCG PO CAPS
145.0000 ug | ORAL_CAPSULE | Freq: Every day | ORAL | Status: DC
  Administered 2017-02-12 – 2017-02-13 (×2): 145 ug via ORAL
  Filled 2017-02-11 (×2): qty 1

## 2017-02-11 MED ORDER — ONDANSETRON HCL 4 MG/2ML IJ SOLN: 4.0000 mg | Freq: Four times a day (QID) | INTRAMUSCULAR | Status: DC | PRN

## 2017-02-11 MED ORDER — ACETAMINOPHEN 325 MG PO TABS: 650.0000 mg | ORAL_TABLET | ORAL | Status: DC | PRN

## 2017-02-11 MED ORDER — SODIUM CHLORIDE 0.9 % IV SOLN: 250.0000 mL | INTRAVENOUS | Status: DC | PRN

## 2017-02-11 MED ORDER — METHYLPREDNISOLONE SODIUM SUCC 125 MG IJ SOLR
60.0000 mg | Freq: Two times a day (BID) | INTRAMUSCULAR | Status: DC
  Administered 2017-02-11 – 2017-02-12 (×2): 60 mg via INTRAVENOUS
  Filled 2017-02-11 (×2): qty 2

## 2017-02-11 MED ORDER — INSULIN ASPART 100 UNIT/ML ~~LOC~~ SOLN
0.0000 [IU] | Freq: Three times a day (TID) | SUBCUTANEOUS | Status: DC
  Administered 2017-02-12 (×3): 3 [IU] via SUBCUTANEOUS

## 2017-02-11 MED ORDER — MOMETASONE FURO-FORMOTEROL FUM 200-5 MCG/ACT IN AERO
2.0000 | INHALATION_SPRAY | Freq: Two times a day (BID) | RESPIRATORY_TRACT | Status: DC
  Administered 2017-02-12 – 2017-02-13 (×2): 2 via RESPIRATORY_TRACT
  Filled 2017-02-11: qty 8.8

## 2017-02-11 MED ORDER — IPRATROPIUM-ALBUTEROL 0.5-2.5 (3) MG/3ML IN SOLN
3.0000 mL | Freq: Four times a day (QID) | RESPIRATORY_TRACT | Status: DC
  Administered 2017-02-11 – 2017-02-12 (×4): 3 mL via RESPIRATORY_TRACT
  Filled 2017-02-11 (×3): qty 3

## 2017-02-11 MED ORDER — FUROSEMIDE 10 MG/ML IJ SOLN
40.0000 mg | Freq: Two times a day (BID) | INTRAMUSCULAR | Status: DC
  Administered 2017-02-11 – 2017-02-12 (×2): 40 mg via INTRAVENOUS
  Filled 2017-02-11 (×2): qty 4

## 2017-02-11 MED ORDER — ALBUTEROL (5 MG/ML) CONTINUOUS INHALATION SOLN
10.0000 mg/h | INHALATION_SOLUTION | Freq: Once | RESPIRATORY_TRACT | Status: AC
  Administered 2017-02-11: 10 mg/h via RESPIRATORY_TRACT
  Filled 2017-02-11: qty 20

## 2017-02-11 MED ORDER — NICOTINE 21 MG/24HR TD PT24
21.0000 mg | MEDICATED_PATCH | Freq: Every day | TRANSDERMAL | Status: DC
  Administered 2017-02-12 – 2017-02-13 (×2): 21 mg via TRANSDERMAL
  Filled 2017-02-11 (×2): qty 1

## 2017-02-11 MED ORDER — FUROSEMIDE 10 MG/ML IJ SOLN
40.0000 mg | Freq: Once | INTRAMUSCULAR | Status: AC
  Administered 2017-02-11: 40 mg via INTRAVENOUS
  Filled 2017-02-11: qty 4

## 2017-02-11 MED ORDER — ALBUTEROL SULFATE (2.5 MG/3ML) 0.083% IN NEBU
5.0000 mg | INHALATION_SOLUTION | Freq: Once | RESPIRATORY_TRACT | Status: AC
  Administered 2017-02-11: 5 mg via RESPIRATORY_TRACT
  Filled 2017-02-11: qty 6

## 2017-02-11 MED ORDER — ALBUTEROL SULFATE (2.5 MG/3ML) 0.083% IN NEBU: 2.5000 mg | INHALATION_SOLUTION | RESPIRATORY_TRACT | Status: DC | PRN

## 2017-02-11 MED ORDER — SODIUM CHLORIDE 0.9% FLUSH: 3.0000 mL | INTRAVENOUS | Status: DC | PRN

## 2017-02-11 MED ORDER — HYDROCHLOROTHIAZIDE 12.5 MG PO CAPS
12.5000 mg | ORAL_CAPSULE | Freq: Every day | ORAL | Status: DC
  Administered 2017-02-11 – 2017-02-12 (×2): 12.5 mg via ORAL
  Filled 2017-02-11 (×2): qty 1

## 2017-02-11 MED ORDER — GUAIFENESIN ER 600 MG PO TB12
600.0000 mg | ORAL_TABLET | Freq: Two times a day (BID) | ORAL | Status: DC
  Administered 2017-02-11 – 2017-02-13 (×4): 600 mg via ORAL
  Filled 2017-02-11 (×6): qty 1

## 2017-02-11 MED ORDER — GABAPENTIN 100 MG PO CAPS
100.0000 mg | ORAL_CAPSULE | Freq: Every day | ORAL | Status: DC
  Administered 2017-02-12 – 2017-02-13 (×3): 100 mg via ORAL
  Filled 2017-02-11 (×3): qty 1

## 2017-02-11 MED ORDER — ASPIRIN EC 81 MG PO TBEC
162.0000 mg | DELAYED_RELEASE_TABLET | Freq: Every day | ORAL | Status: DC
  Administered 2017-02-12 – 2017-02-13 (×2): 162 mg via ORAL
  Filled 2017-02-11 (×4): qty 2

## 2017-02-11 MED ORDER — BUPROPION HCL ER (SR) 150 MG PO TB12
150.0000 mg | ORAL_TABLET | Freq: Two times a day (BID) | ORAL | Status: DC
  Administered 2017-02-11 – 2017-02-13 (×4): 150 mg via ORAL
  Filled 2017-02-11 (×4): qty 1

## 2017-02-11 MED ORDER — ATORVASTATIN CALCIUM 40 MG PO TABS
40.0000 mg | ORAL_TABLET | Freq: Every day | ORAL | Status: DC
  Administered 2017-02-12 – 2017-02-13 (×2): 40 mg via ORAL
  Filled 2017-02-11 (×2): qty 1

## 2017-02-11 MED ORDER — PANTOPRAZOLE SODIUM 40 MG PO TBEC
40.0000 mg | DELAYED_RELEASE_TABLET | Freq: Every day | ORAL | Status: DC
  Administered 2017-02-12 – 2017-02-13 (×2): 40 mg via ORAL
  Filled 2017-02-11 (×2): qty 1

## 2017-02-11 MED ORDER — SODIUM CHLORIDE 0.9% FLUSH
3.0000 mL | Freq: Two times a day (BID) | INTRAVENOUS | Status: DC
Start: 2017-02-11 — End: 2017-02-13
  Administered 2017-02-11 – 2017-02-13 (×4): 3 mL via INTRAVENOUS

## 2017-02-11 NOTE — ED Notes (Signed)
Pt says she tolerated walking well and did not feel tired but VS were... O2 - 91% HR 147, Respirations were 32, and BP 132/60 upon sitting back down. Had to tell her to take some deep breaths to slow respirations down.

## 2017-02-11 NOTE — ED Triage Notes (Signed)
Patient c/o shortness of breath that started yesterday. Per patient productive cough with thick white sputum, sore throat, and possible fever. Patient also states soreness in chest with cough. Hx of COPD and CHF. Patient does report swelling in feet bilaterally that started last night. Per patient used neb treatment last night with no relief.

## 2017-02-11 NOTE — ED Provider Notes (Signed)
[facility] DEPT Provider Note   CSN: [id number] Arrival date & time: 02/11/17  1252  By signing my name below, I, Hansel Feinstein, attest that this documentation has been prepared under the direction and in the presence of Orlie Dakin, MD. Electronically Signed: Hansel Feinstein, ED Scribe. 02/11/17. 1:36 PM.     History   Chief Complaint Chief Complaint  Patient presents with  . Shortness of Breath    HPI Erica [name] is a 66 y.o. female with h/o COPD, CHF, bronchitis, DM who presents to the Emergency Department complaining of intermittent, productive cough with white phlegm that began a week ago and worsened last night. Pt reports associated subjective fever and sore throat. Pt has taken her regular medications as prescribed with no relief. She additionally reports b/l feet swelling last night that improved in the morning. She is O2 dependent at home on 2L. Pt reports h/o back injury 19 years ago with chronicBack pain. She is a current smoker. Pt is a non-drinker and denies illicit drug use. She denies any additional associated symptoms. She treated herself with her usual medications, without relief. Associated symptoms include questionable fever but she did not take her temperature No other associated symptoms. Nothing makes symptoms better or worse  The history is provided by the patient. No language interpreter was used.    Past Medical History:  Diagnosis Date  . Chronic back pain   . COPD (chronic obstructive pulmonary disease) ([place])   . Diabetes mellitus without complication ([place])   . Hypertension   . Oxygen deficiency     Patient Active Problem List   Diagnosis Date Noted  . Acute respiratory failure with hypoxia ([place]) 11/20/2015  . COPD exacerbation ([place]) 11/20/2015  . Benign essential HTN 11/20/2015  . GERD (gastroesophageal reflux disease) 11/20/2015  . Tobacco abuse 11/20/2015  . Type 2 diabetes mellitus with hyperglycemia ([place]) 11/20/2015  . COPD exacerbation  ([place]) 09/05/2015  . Chronic pain syndrome 09/05/2015  . Hypertension 09/05/2015  . Diabetes mellitus type 2, controlled ([place]) 09/05/2015  . Chronic respiratory failure ([place]) 09/05/2015  . OLECRANON BURSITIS, LEFT 08/04/2010    Past Surgical History:  Procedure Laterality Date  . ABDOMINAL HYSTERECTOMY      OB History    Gravida Para Term Preterm AB Living   0 0 0 0 0     SAB TAB Ectopic Multiple Live Births   0 0 0           Home Medications    Prior to Admission medications   Medication Sig Start Date End Date Taking? Authorizing Provider  albuterol (PROVENTIL) (2.5 MG/3ML) 0.083% nebulizer solution Take 2.5 mg by nebulization every 6 (six) hours as needed for wheezing or shortness of breath.  09/09/15   [provider]  albuterol-ipratropium (COMBIVENT) 18-103 MCG/ACT inhaler Inhale 1 puff into the lungs every 6 (six) hours as needed for wheezing or shortness of breath. 11/23/15   Barton Dubois, MD  aspirin 81 MG tablet Take 2 tablets by mouth daily.  10/22/15   [provider]  atorvastatin (LIPITOR) 40 MG tablet Take 40 mg by mouth daily. 12/04/16   [provider]  azithromycin (ZITHROMAX) 250 MG tablet 1 tablet daily starting Tuesday evening 12/18/16   Nat Christen, MD  budesonide-formoterol [facility]) 160-4.5 MCG/ACT inhaler Inhale 2 puffs into the lungs 2 (two) times daily. 11/23/15   Barton Dubois, MD  buPROPion [facility] SR) 150 MG 12 hr tablet Take 150 mg by mouth 2 (two) times daily.  12/04/16   [provider]  chlorpheniramine-HYDROcodone (TUSSIONEX PENNKINETIC ER) 10-8 MG/5ML SUER Take 5 mLs by mouth every 12 (twelve) hours as needed for cough. 12/18/16   Nat Christen, MD  famotidine (PEPCID) 20 MG tablet Take 1 tablet by mouth 2 (two) times daily. 09/21/15   [provider]  gabapentin (NEURONTIN) 100 MG capsule Take 1 capsule by mouth daily. 11/05/13   [provider]  Linaclotide [facility]) 145 MCG CAPS capsule Take  145 mcg by mouth daily.    [provider]  meloxicam (MOBIC) 15 MG tablet Take 1 tablet by mouth daily. Reported on 09/22/2015 11/11/13   [provider]  metFORMIN (GLUCOPHAGE-XR) 500 MG 24 hr tablet Take 500 mg by mouth 2 (two) times daily. 12/04/16   [provider]  omega-3 acid ethyl esters (LOVAZA) 1 g capsule Take 1 capsule by mouth 4 (four) times daily. 12/07/16   [provider]  omeprazole (PRILOSEC) 20 MG capsule Take 20 mg by mouth daily. 12/04/16   [provider]  predniSONE (DELTASONE) 10 MG tablet Take 2 tablets (20 mg total) by mouth daily. 12/18/16   Nat Christen, MD  saxagliptin HCl (ONGLYZA) 5 MG TABS tablet Take 5 mg by mouth daily. Reported on 09/22/2015    [provider]  SPIRIVA HANDIHALER 18 MCG inhalation capsule Place 1 capsule into inhaler and inhale daily. 09/22/15   [provider]  valsartan-hydrochlorothiazide (DIOVAN-HCT) 160-12.5 MG per tablet Take 1 tablet by mouth daily.    [provider]    Family History No family history on file.  Social History Social History  Substance Use Topics  . Smoking status: Current Every Day Smoker    Packs/day: 0.50    Years: 16.00    Types: Cigarettes  . Smokeless tobacco: Never Used  . Alcohol use No     Allergies   Patient has no known allergies.   Review of Systems Review of Systems  Constitutional: Positive for fever (subjective).  HENT: Positive for sore throat.   Respiratory: Positive for cough.   Cardiovascular: Positive for leg swelling.  Gastrointestinal: Negative.   Musculoskeletal: Positive for back pain.  Skin: Negative.   Allergic/Immunologic: Positive for immunocompromised state.       Diabetic  Neurological: Negative.   Psychiatric/Behavioral: Negative.   All other systems reviewed and are negative.    Physical Exam Updated Vital Signs Ht 5\' 4"  (1.626 m)   Wt 177 lb (80.3 kg)   SpO2 97%   BMI 30.38 kg/m    Physical Exam  Constitutional:  Chronically ill-appearing. No respiratory distress  HENT:  Head: Normocephalic and atraumatic.  Mouth/Throat: Oropharynx is clear and moist.  Eyes: Conjunctivae are normal. Pupils are equal, round, and reactive to light.  Neck: Neck supple. No tracheal deviation present. No thyromegaly present.  Cardiovascular: Normal rate.   No murmur heard. Mildly tachycardia  Pulmonary/Chest: Effort normal.  Expiratory wheezes and diffuse rhonchi no respiratory distress  Abdominal: Soft. Bowel sounds are normal. She exhibits no distension. There is no tenderness.  Musculoskeletal: Normal range of motion. She exhibits edema. She exhibits no tenderness.  Trace minimal pedal edema bilaterally  Neurological: She is alert. Coordination normal.  Skin: Skin is warm and dry. No rash noted.  Psychiatric: She has a normal mood and affect.  Nursing note and vitals reviewed.    ED Treatments / Results   DIAGNOSTIC STUDIES: Oxygen Saturation is 97% on RA, normal by my interpretation.    COORDINATION OF CARE:  1:29 PM Discussed treatment plan with pt at bedside which includes labs and pt agreed to plan.    Labs (all labs ordered are listed, but only abnormal results are displayed) Labs Reviewed  BRAIN NATRIURETIC PEPTIDE    EKG  EKG Interpretation  Date/Time:  Sunday Feb 11 2017 13:01:23 EDT Ventricular Rate:  122 PR Interval:    QRS Duration: 105 QT Interval:  318 QTC Calculation: 453 R Axis:   71 Text Interpretation:  Sinus tachycardia Ventricular premature complex Aberrant conduction of SV complex(es) Consider right atrial enlargement Minimal ST depression, inferior leads Minimal ST elevation, lateral leads No significant change since last tracing Confirmed by Winfred Leeds  MD, Amritha Yorke [phone]) on 02/11/2017 1:28:39 PM       Radiology No results found.  Procedures Procedures (including critical care time)  Medications Ordered in ED Medications  albuterol  (PROVENTIL) (2.5 MG/3ML) 0.083% nebulizer solution 5 mg (not administered)    Chest x-ray viewed by me Results for orders placed or performed during the hospital encounter of 02/11/17  Brain natriuretic peptide  Result Value Ref Range   B Natriuretic Peptide 243.0 (H) 0.0 - 100.0 pg/mL  CBC with Differential/Platelet  Result Value Ref Range   WBC 5.8 4.0 - 10.5 K/uL   RBC 4.49 3.87 - 5.11 MIL/uL   Hemoglobin 12.1 12.0 - 15.0 g/dL   HCT 36.6 36.0 - 46.0 %   MCV 81.5 78.0 - 100.0 fL   MCH 26.9 26.0 - 34.0 pg   MCHC 33.1 30.0 - 36.0 g/dL   RDW 15.7 (H) 11.5 - 15.5 %   Platelets 223 150 - 400 K/uL   Neutrophils Relative % 57 %   Neutro Abs 3.3 1.7 - 7.7 K/uL   Lymphocytes Relative 29 %   Lymphs Abs 1.7 0.7 - 4.0 K/uL   Monocytes Relative 11 %   Monocytes Absolute 0.6 0.1 - 1.0 K/uL   Eosinophils Relative 3 %   Eosinophils Absolute 0.2 0.0 - 0.7 K/uL   Basophils Relative 0 %   Basophils Absolute 0.0 0.0 - 0.1 K/uL  Basic metabolic panel  Result Value Ref Range   Sodium 141 135 - 145 mmol/L   Potassium 4.0 3.5 - 5.1 mmol/L   Chloride 104 101 - 111 mmol/L   CO2 28 22 - 32 mmol/L   Glucose, Bld 123 (H) 65 - 99 mg/dL   BUN 14 6 - 20 mg/dL   Creatinine, Ser 1.21 (H) 0.44 - 1.00 mg/dL   Calcium 9.3 8.9 - 10.3 mg/dL   GFR calc non Af Amer 45 (L) >60 mL/min   GFR calc Af Amer 52 (L) >60 mL/min   Anion gap 9 5 - 15  Chest x-ray viewed by me Dg Chest 2 View  Result Date: 02/11/2017 CLINICAL DATA:  Shortness of breath. EXAM: CHEST  2 VIEW COMPARISON:  December 18, 2016 FINDINGS: No pneumothorax. Cardiomegaly identified. Small bilateral pleural effusions, unchanged since March 2017. Diffuse interstitial opacities are largely chronic. It would be difficult to exclude an acute on chronic process is a findings are more prominent when compared to March 2018. The cardiomediastinal silhouette is stable. No pneumothorax. No other change. IMPRESSION: 1. Diffuse interstitial opacities certainly have  are chronic component consistent with chronic interstitial lung disease. There are also small chronic pleural effusions. An acute on chronic process such is mild edema is not excluded as the findings are mildly more prominent when compared to March of 2018. Electronically Signed   By: Dorise Bullion  III M.D   On: 02/11/2017 13:59   Initial Impression / Assessment and Plan / ED Course  I have reviewed the triage vital signs and the nursing notes.  Pertinent labs & imaging results that were available during my care of the patient were reviewed by me and considered in my medical decision making (see chart for details).     6:10 PM Patient diuresed 900 mL of urine after treatment intravenous Lasix and oral prednisone. Her breathing is improved after treatment with continuous nebulization with albuterol. However she remains tachypneic and tachycardic. Lungs with prolonged at expiratory with extra wheezes .  Dyspnea is multifactorial secondary to CHF and COPD. I've consulted Dr. Lorin Mercy from hospital service who will arrange for overnight stay  Final Clinical Impressions(s) / ED Diagnoses  Diagnosis acute dyspnea  Final diagnoses:  None    New Prescriptions New Prescriptions   No medications on file    I personally performed the services described in this documentation, which was scribed in my presence. The recorded information has been reviewed and considered.     Orlie Dakin, MD 02/11/17 [phone]

## 2017-02-11 NOTE — ED Notes (Signed)
Pt.'s O2 sats were reading lower at times due to SPO2 monitor. Changed out cord and now sats are in the mid 90s.

## 2017-02-11 NOTE — H&P (Signed)
History and Physical    Erica [name] ZOX:[id number] DOB: 1951-02-04 DOA: 02/11/2017  PCP: Iona Beard, MD Consultants:  Pulmonology in [place] Patient coming from: Home - lives with son; Donald Prose: sister, [phone]  Chief Complaint: SOB  HPI: Erica [name] is a 67 y.o. female with medical history significant of HTN, DM, COPD on 2L home O2, chronic pain, and CHF (preserved EF with ungradeable diastolic dysfunction in 5/62) presents because "I couldn't breathe, I was short of breath".  Started last night, feet "swelled up real big" so she came over to the ER today.  +wheezing.  +cough - thick yellow-white phlegm, "sometimes it feels like it's stuck down inside and won't come up".  No fever but she has been sweating and feeling cold.  Swelling in her feet is improved with Lasix given in the ER.  Wears 2L home O2, currently still on 2L.     ED Course:  Diuresed 900 mL in ER with Lasix and given PO Prednisone and Albuterol neb.  +tachycardia, tachypnea and prolonged expiration with expiratory wheezing.  Dyspnea multifactorial from COPD and CHF.  Review of Systems: As per HPI; otherwise review of systems reviewed and negative.   Ambulatory Status:  Ambulates with a cane  Past Medical History:  Diagnosis Date  . CHF (congestive heart failure) ([place])   . Chronic back pain   . COPD (chronic obstructive pulmonary disease) ([facility])    2L home O2  . Diabetes mellitus without complication ([place])   . Hypertension   . Oxygen deficiency     Past Surgical History:  Procedure Laterality Date  . ABDOMINAL HYSTERECTOMY      Social History   Social History  . Marital status: Divorced    Spouse name: N/A  . Number of children: N/A  . Years of education: N/A   Occupational History  . retired    Social History Main Topics  . Smoking status: Current Every Day Smoker    Packs/day: 1.00    Years: 51.00    Types: Cigarettes  . Smokeless tobacco: Never Used  . Alcohol use No  . Drug use: No   . Sexual activity: Not on file   Other Topics Concern  . Not on file   Social History Narrative   ** Merged History Encounter **        No Known Allergies  Family History  Problem Relation Age of Onset  . CVA Mother 45    Prior to Admission medications   Medication Sig Start Date End Date Taking? Authorizing Provider  albuterol (PROVENTIL) (2.5 MG/3ML) 0.083% nebulizer solution Take 2.5 mg by nebulization every 6 (six) hours as needed for wheezing or shortness of breath.  09/09/15  Yes [provider]  albuterol-ipratropium (COMBIVENT) 18-103 MCG/ACT inhaler Inhale 1 puff into the lungs every 6 (six) hours as needed for wheezing or shortness of breath. 11/23/15  Yes Barton Dubois, MD  aspirin 81 MG tablet Take 2 tablets by mouth daily.  10/22/15  Yes [provider]  atorvastatin (LIPITOR) 40 MG tablet Take 40 mg by mouth daily. 12/04/16  Yes [provider]  budesonide-formoterol (SYMBICORT) 160-4.5 MCG/ACT inhaler Inhale 2 puffs into the lungs 2 (two) times daily. 11/23/15  Yes Barton Dubois, MD  buPROPion [facility] SR) 150 MG 12 hr tablet Take 150 mg by mouth 2 (two) times daily. 12/04/16  Yes [provider]  chlorpheniramine-HYDROcodone (TUSSIONEX PENNKINETIC ER) 10-8 MG/5ML SUER Take 5 mLs by mouth every 12 (twelve) hours as  needed for cough. 12/18/16  Yes Nat Christen, MD  gabapentin (NEURONTIN) 100 MG capsule Take 1 capsule by mouth daily. 11/05/13  Yes [provider]  Linaclotide (LINZESS) 145 MCG CAPS capsule Take 145 mcg by mouth daily.   Yes [provider]  metFORMIN (GLUCOPHAGE-XR) 500 MG 24 hr tablet Take 500 mg by mouth 2 (two) times daily. 12/04/16  Yes [provider]  Multiple Vitamin (MULTIVITAMIN WITH MINERALS) TABS tablet Take 1 tablet by mouth daily.   Yes [provider]  naproxen sodium (ANAPROX) 220 MG tablet Take 440 mg by mouth daily as needed (pain).   Yes [provider]    omega-3 acid ethyl esters (LOVAZA) 1 g capsule Take 1 capsule by mouth 4 (four) times daily. 12/07/16  Yes [provider]  omeprazole (PRILOSEC) 20 MG capsule Take 20 mg by mouth daily. 12/04/16  Yes [provider]  saxagliptin HCl (ONGLYZA) 5 MG TABS tablet Take 5 mg by mouth daily. Reported on 09/22/2015   Yes [provider]  SPIRIVA HANDIHALER 18 MCG inhalation capsule Place 1 capsule into inhaler and inhale daily. 09/22/15  Yes [provider]  valsartan-hydrochlorothiazide (DIOVAN-HCT) 160-12.5 MG per tablet Take 1 tablet by mouth daily.   Yes [provider]  azithromycin (ZITHROMAX) 250 MG tablet 1 tablet daily starting Tuesday evening Patient not taking: Reported on 02/11/2017 12/18/16   Nat Christen, MD    Physical Exam: Vitals:   02/11/17 1938 02/11/17 1950 02/11/17 2000 02/11/17 2030  BP:  (!) 136/56 (!) 183/163 136/61  Pulse:  (!) 115 (!) 128 (!) 119  Resp:  17 (!) 29 (!) 22  Temp:      TempSrc:      SpO2: 97% 98% 100% 97%  Weight:      Height:         General:  Appears calm and comfortable and is NAD on [place] O2 Eyes:  PERRL, EOMI, normal lids, iris ENT:  grossly normal hearing, lips & tongue, mmm Neck:  no LAD, masses or thyromegaly Cardiovascular:  +tachycardia,  no m/r/g. Trace dependent LE edema.  Respiratory: Diffuse expiratory wheezes.  Mild L>R bibasilar crackles.  Increased respiratory rate. Abdomen:  soft, mild midepigastric TTP, nd, NABS Skin:  no rash or induration seen on limited exam Musculoskeletal:  grossly normal tone BUE/BLE, good ROM, no bony abnormality Psychiatric:  grossly normal mood and affect, speech fluent and appropriate, AOx3 Neurologic:  CN 2-12 grossly intact, moves all extremities in coordinated fashion, sensation intact  Labs on Admission: I have personally reviewed following labs and imaging studies  CBC:  Recent Labs Lab 02/11/17 1303  WBC 5.8  NEUTROABS 3.3  HGB 12.1  HCT 36.6  MCV  81.5  PLT 706   Basic Metabolic Panel:  Recent Labs Lab 02/11/17 1303  NA 141  K 4.0  CL 104  CO2 28  GLUCOSE 123*  BUN 14  CREATININE 1.21*  CALCIUM 9.3   GFR: Estimated Creatinine Clearance: 46.2 mL/min (A) (by C-G formula based on SCr of 1.21 mg/dL (H)). Liver Function Tests: No results for input(s): AST, ALT, ALKPHOS, BILITOT, PROT, ALBUMIN in the last 168 hours. No results for input(s): LIPASE, AMYLASE in the last 168 hours. No results for input(s): AMMONIA in the last 168 hours. Coagulation Profile: No results for input(s): INR, PROTIME in the last 168 hours. Cardiac Enzymes: No results for input(s): CKTOTAL, CKMB, CKMBINDEX, TROPONINI in the last 168 hours. BNP (last 3 results) No results for input(s):  PROBNP in the last 8760 hours. HbA1C: No results for input(s): HGBA1C in the last 72 hours. CBG: No results for input(s): GLUCAP in the last 168 hours. Lipid Profile: No results for input(s): CHOL, HDL, LDLCALC, TRIG, CHOLHDL, LDLDIRECT in the last 72 hours. Thyroid Function Tests: No results for input(s): TSH, T4TOTAL, FREET4, T3FREE, THYROIDAB in the last 72 hours. Anemia Panel: No results for input(s): VITAMINB12, FOLATE, FERRITIN, TIBC, IRON, RETICCTPCT in the last 72 hours. Urine analysis:    Component Value Date/Time   COLORURINE YELLOW 03/14/2013 1847   APPEARANCEUR CLEAR 03/14/2013 1847   LABSPEC 1.025 03/14/2013 1847   PHURINE 6.0 03/14/2013 1847   GLUCOSEU NEGATIVE 03/14/2013 1847   HGBUR NEGATIVE 03/14/2013 1847   BILIRUBINUR NEGATIVE 03/14/2013 1847   KETONESUR NEGATIVE 03/14/2013 1847   PROTEINUR NEGATIVE 03/14/2013 1847   UROBILINOGEN 0.2 03/14/2013 1847   NITRITE NEGATIVE 03/14/2013 1847   LEUKOCYTESUR NEGATIVE 03/14/2013 1847    Creatinine Clearance: Estimated Creatinine Clearance: 46.2 mL/min (A) (by C-G formula based on SCr of 1.21 mg/dL (H)).  Sepsis Labs: @LABRCNTIP (procalcitonin:4,lacticidven:4) )No results found for this or any  previous visit (from the past 240 hour(s)).   Radiological Exams on Admission: Dg Chest 2 View  Result Date: 02/11/2017 CLINICAL DATA:  Shortness of breath. EXAM: CHEST  2 VIEW COMPARISON:  December 18, 2016 FINDINGS: No pneumothorax. Cardiomegaly identified. Small bilateral pleural effusions, unchanged since March 2017. Diffuse interstitial opacities are largely chronic. It would be difficult to exclude an acute on chronic process is a findings are more prominent when compared to March 2018. The cardiomediastinal silhouette is stable. No pneumothorax. No other change. IMPRESSION: 1. Diffuse interstitial opacities certainly have are chronic component consistent with chronic interstitial lung disease. There are also small chronic pleural effusions. An acute on chronic process such is mild edema is not excluded as the findings are mildly more prominent when compared to March of 2018. Electronically Signed   By: Dorise Bullion III M.D   On: 02/11/2017 13:59    EKG: Independently reviewed.  Sinus tachycardia with rate 122;rate related changes with NSCSLT  Assessment/Plan Principal Problem:   Acute on chronic respiratory failure (H[facility]) Active Problems:   COPD exacerbation ([facility])   Diabetes mellitus type 2, controlled ([place])   Benign essential HTN   Tobacco abuse   Acute on chronic diastolic CHF (congestive heart failure) (H[facility])     Acute on chronic respiratory failure likely resulting from both COPD and mild CHF exacerbation -Patient presenting with c/o SOB; also with tachycardia and tachypnea -Given both Lasix and Prednisone in the ER with some improvement -She is on her home O2 at 2L at this time -will place in observation with telemetry bed  -Nebulizers: scheduled Duoneb and prn albuterol -Solu-Medrol 60 mg IV BID  -Oral doxycycline.  -Mucinex for cough  -CXR somewhat consistent with pulmonary edema -Elevated BNP 243, prior 133 in 2/17 -There is likely also a CHF component here -Will  request echocardiogram -last one appears to have been in 2/17 -Continue ASA -Will continue Diovan HCT -She is not on apparent beta blocker; will not start due to acute exacerbation but consider addition -CHF order set utilized; may need CHF team consult but will hold until Echo results are available -Was given Lasix 40 mg x 1 in ER and will repeat with 40 mg IV BID for now -Continue  O2  -Stable kidney function at this time (Creatinine 1.21/GFR 45), will follow -Repeat EKG in AM  DM -Glucose 123 -Will check  A1c -hold Glucophage, Onglyza -Cover with SSI  HTN -Continue Diovan-HCT -Consider addition of beta blocker, as above  Tobacco dependence -Encourage cessation.  This was discussed with the patient and should be reviewed on an ongoing basis.   -Patch ordered at patient request.  DVT prophylaxis: Lovenox  Code Status: DNR - confirmed with patient Family Communication: None present Disposition Plan:  Home once clinically improved Consults called: None  Admission status: It is my clinical opinion that referral for OBSERVATION is reasonable and necessary in this patient based on the above information provided. The aforementioned taken together are felt to place the patient at high risk for further clinical deterioration. However it is anticipated that the patient may be medically stable for discharge from the hospital within 24 to 48 hours.    Karmen Bongo MD [facility]  If 7PM-7AM, please contact night-coverage [email or web address] Password [facility]  02/11/2017, 9:16 PM

## 2017-02-12 ENCOUNTER — Observation Stay (HOSPITAL_BASED_OUTPATIENT_CLINIC_OR_DEPARTMENT_OTHER): Payer: Medicare Other

## 2017-02-12 DIAGNOSIS — E119 Type 2 diabetes mellitus without complications: Secondary | ICD-10-CM | POA: Diagnosis not present

## 2017-02-12 DIAGNOSIS — J9620 Acute and chronic respiratory failure, unspecified whether with hypoxia or hypercapnia: Secondary | ICD-10-CM

## 2017-02-12 DIAGNOSIS — Z9981 Dependence on supplemental oxygen: Secondary | ICD-10-CM | POA: Diagnosis not present

## 2017-02-12 DIAGNOSIS — J441 Chronic obstructive pulmonary disease with (acute) exacerbation: Secondary | ICD-10-CM | POA: Diagnosis not present

## 2017-02-12 DIAGNOSIS — I10 Essential (primary) hypertension: Secondary | ICD-10-CM | POA: Diagnosis not present

## 2017-02-12 DIAGNOSIS — I5033 Acute on chronic diastolic (congestive) heart failure: Secondary | ICD-10-CM

## 2017-02-12 DIAGNOSIS — I340 Nonrheumatic mitral (valve) insufficiency: Secondary | ICD-10-CM | POA: Diagnosis not present

## 2017-02-12 DIAGNOSIS — F17210 Nicotine dependence, cigarettes, uncomplicated: Secondary | ICD-10-CM | POA: Diagnosis not present

## 2017-02-12 DIAGNOSIS — J9621 Acute and chronic respiratory failure with hypoxia: Secondary | ICD-10-CM | POA: Diagnosis not present

## 2017-02-12 DIAGNOSIS — Z7982 Long term (current) use of aspirin: Secondary | ICD-10-CM | POA: Diagnosis not present

## 2017-02-12 DIAGNOSIS — Z720 Tobacco use: Secondary | ICD-10-CM

## 2017-02-12 DIAGNOSIS — R0600 Dyspnea, unspecified: Secondary | ICD-10-CM

## 2017-02-12 DIAGNOSIS — E1165 Type 2 diabetes mellitus with hyperglycemia: Secondary | ICD-10-CM | POA: Diagnosis not present

## 2017-02-12 DIAGNOSIS — K219 Gastro-esophageal reflux disease without esophagitis: Secondary | ICD-10-CM | POA: Diagnosis not present

## 2017-02-12 DIAGNOSIS — Z7984 Long term (current) use of oral hypoglycemic drugs: Secondary | ICD-10-CM | POA: Diagnosis not present

## 2017-02-12 DIAGNOSIS — I110 Hypertensive heart disease with heart failure: Secondary | ICD-10-CM | POA: Diagnosis not present

## 2017-02-12 DIAGNOSIS — J9622 Acute and chronic respiratory failure with hypercapnia: Secondary | ICD-10-CM | POA: Diagnosis not present

## 2017-02-12 DIAGNOSIS — N179 Acute kidney failure, unspecified: Secondary | ICD-10-CM | POA: Diagnosis not present

## 2017-02-12 DIAGNOSIS — Z66 Do not resuscitate: Secondary | ICD-10-CM | POA: Diagnosis not present

## 2017-02-12 DIAGNOSIS — Z7951 Long term (current) use of inhaled steroids: Secondary | ICD-10-CM | POA: Diagnosis not present

## 2017-02-12 LAB — CBC WITH DIFFERENTIAL/PLATELET
BASOS ABS: 0 10*3/uL (ref 0.0–0.1)
Basophils Relative: 0 %
EOS PCT: 0 %
Eosinophils Absolute: 0 10*3/uL (ref 0.0–0.7)
HCT: 37.5 % (ref 36.0–46.0)
Hemoglobin: 12.1 g/dL (ref 12.0–15.0)
Lymphocytes Relative: 8 %
Lymphs Abs: 0.4 10*3/uL — ABNORMAL LOW (ref 0.7–4.0)
MCH: 26.4 pg (ref 26.0–34.0)
MCHC: 32.3 g/dL (ref 30.0–36.0)
MCV: 81.7 fL (ref 78.0–100.0)
MONO ABS: 0 10*3/uL — AB (ref 0.1–1.0)
MONOS PCT: 1 %
NEUTROS ABS: 4.5 10*3/uL (ref 1.7–7.7)
Neutrophils Relative %: 91 %
PLATELETS: 230 10*3/uL (ref 150–400)
RBC: 4.59 MIL/uL (ref 3.87–5.11)
RDW: 15.7 % — AB (ref 11.5–15.5)
WBC: 5 10*3/uL (ref 4.0–10.5)

## 2017-02-12 LAB — GLUCOSE, CAPILLARY
GLUCOSE-CAPILLARY: 146 mg/dL — AB (ref 65–99)
Glucose-Capillary: 188 mg/dL — ABNORMAL HIGH (ref 65–99)
Glucose-Capillary: 164 mg/dL — ABNORMAL HIGH (ref 65–99)
Glucose-Capillary: 187 mg/dL — ABNORMAL HIGH (ref 65–99)

## 2017-02-12 LAB — BASIC METABOLIC PANEL
ANION GAP: 12 (ref 5–15)
BUN: 18 mg/dL (ref 6–20)
CO2: 30 mmol/L (ref 22–32)
CREATININE: 1.41 mg/dL — AB (ref 0.44–1.00)
Calcium: 9.2 mg/dL (ref 8.9–10.3)
Chloride: 98 mmol/L — ABNORMAL LOW (ref 101–111)
GFR, EST AFRICAN AMERICAN: 44 mL/min — AB (ref >60)
GFR, EST NON AFRICAN AMERICAN: 38 mL/min — AB (ref >60)
Glucose, Bld: 184 mg/dL — ABNORMAL HIGH (ref 65–99)
Potassium: 4.4 mmol/L (ref 3.5–5.1)
SODIUM: 140 mmol/L (ref 135–145)

## 2017-02-12 LAB — ECHOCARDIOGRAM COMPLETE
HEIGHTINCHES: 64 in
Weight: 2801.6 oz

## 2017-02-12 MED ORDER — PREDNISONE 20 MG PO TABS
40.0000 mg | ORAL_TABLET | Freq: Every day | ORAL | Status: DC
  Administered 2017-02-13: 40 mg via ORAL
  Filled 2017-02-12: qty 2

## 2017-02-12 MED ORDER — LEVALBUTEROL HCL 0.63 MG/3ML IN NEBU
0.6300 mg | INHALATION_SOLUTION | Freq: Three times a day (TID) | RESPIRATORY_TRACT | Status: DC
  Administered 2017-02-12 – 2017-02-13 (×2): 0.63 mg via RESPIRATORY_TRACT
  Filled 2017-02-12 (×2): qty 3

## 2017-02-12 MED ORDER — IPRATROPIUM-ALBUTEROL 0.5-2.5 (3) MG/3ML IN SOLN: 3.0000 mL | Freq: Four times a day (QID) | RESPIRATORY_TRACT | Status: DC | PRN

## 2017-02-12 MED ORDER — FUROSEMIDE 20 MG PO TABS
20.0000 mg | ORAL_TABLET | Freq: Every day | ORAL | Status: DC
  Administered 2017-02-13: 20 mg via ORAL
  Filled 2017-02-12: qty 1

## 2017-02-12 MED ORDER — LEVALBUTEROL HCL 0.63 MG/3ML IN NEBU: 0.6300 mg | INHALATION_SOLUTION | Freq: Four times a day (QID) | RESPIRATORY_TRACT | Status: DC | PRN

## 2017-02-12 NOTE — Progress Notes (Signed)
PROGRESS NOTE    Erica [name]  KDX:[id number] DOB: 04/17/1951 DOA: 02/11/2017 PCP: Iona Beard, MD   Brief Narrative:  Erica [name] is a 66 y.o. female with medical history significant of HTN, DM, COPD on 2L home O2, chronic pain, and CHF (preserved EF with ungradeable diastolic dysfunction in 9/76) presents because "I couldn't breathe, I was short of breath".  Started last night, feet "swelled up real big" so she came over to the ER today.  +wheezing.  +cough - thick yellow-white phlegm, "sometimes it feels like it's stuck down inside and won't come up".  No fever but she has been sweating and feeling cold.  Swelling in her feet is improved with Lasix given in the ER.  Wears 2L home O2, currently still on 2L.    Assessment & Plan:   Principal Problem:   Acute on chronic respiratory failure (HCC) Active Problems:   COPD exacerbation (HCC)   Diabetes mellitus type 2, controlled ([place])   Benign essential HTN   Tobacco abuse   Acute on chronic diastolic CHF (congestive heart failure) (HCC)   Acute on chronic respiratory failure likely resulting from both COPD and mild CHF exacerbation - continue home O2 at 2L at this time  - Nebulizers: scheduled Duoneb and prn albuterol - transition to PO prednisone in am - Oral doxycycline.  - Mucinex for cough  - echocardiogram done today -Continue ASA -Will continue Diovan HCT -Continue [place] O2  - slightly increased creatinine so will change lasix to PO  DM -Glucose 123 -Will check A1c -hold Glucophage, Onglyza -Cover with SSI  HTN -Continue Diovan-HCT -Consider addition of beta blocker  Tobacco dependence -Encourage cessation  DVT prophylaxis: Lovenox  Code Status: DNR  Family Communication: None present Disposition Plan:  Home once clinically improved- likely tomorrow if tachycardia improves   Consultants:   None  Procedures:   None  Antimicrobials:   Doxycycline    Subjective: Patient seen and  examined.  She is hopeful to go home today but was very tachycardic during ambulation and when discussing echocardiogram.  Says she feels markedly better today.  Still needing 2L [place] oxygen.    Objective: Vitals:   02/12/17 1115 02/12/17 1449 02/12/17 1535 02/12/17 1631  BP:   (!) 144/84 121/68  Pulse: (!) 133  (!) 119 72  Resp:   18 20  Temp:    98.2 F (36.8 C)  TempSrc:    Oral  SpO2: 90% 93% 92% 95%  Weight:      Height:        Intake/Output Summary (Last 24 hours) at 02/12/17 1824 Last data filed at 02/12/17 1200  Gross per 24 hour  Intake              480 ml  Output             1500 ml  Net            -1020 ml   Filed Weights   02/11/17 1300 02/11/17 2135 02/12/17 0538  Weight: 80.3 kg (177 lb) 80.3 kg (177 lb) 79.4 kg (175 lb 1.6 oz)    Examination:  General exam: mild distress Respiratory system: expiratory wheezing, no rales, slightly increased work of breathing but no accessory muscle use Cardiovascular system: S1 & S2 heard, tachycardic. No JVD, murmurs, rubs, gallops or clicks. No pedal edema. Gastrointestinal system: Abdomen is nondistended, soft and nontender. No organomegaly or masses felt. Normal bowel sounds heard. Central nervous system: Alert and  oriented. No focal neurological deficits. Extremities: Symmetric 5 x 5 power. Skin: No rashes, lesions or ulcers Psychiatry: Judgement and insight appear normal. Mood & affect appropriate.     Data Reviewed: I have personally reviewed following labs and imaging studies  CBC:  Recent Labs Lab 02/11/17 1303 02/12/17 0429  WBC 5.8 5.0  NEUTROABS 3.3 4.5  HGB 12.1 12.1  HCT 36.6 37.5  MCV 81.5 81.7  PLT 223 518   Basic Metabolic Panel:  Recent Labs Lab 02/11/17 1303 02/12/17 0429  NA 141 140  K 4.0 4.4  CL 104 98*  CO2 28 30  GLUCOSE 123* 184*  BUN 14 18  CREATININE 1.21* 1.41*  CALCIUM 9.3 9.2   GFR: Estimated Creatinine Clearance: 39.5 mL/min (A) (by C-G formula based on SCr of 1.41  mg/dL (H)). Liver Function Tests: No results for input(s): AST, ALT, ALKPHOS, BILITOT, PROT, ALBUMIN in the last 168 hours. No results for input(s): LIPASE, AMYLASE in the last 168 hours. No results for input(s): AMMONIA in the last 168 hours. Coagulation Profile: No results for input(s): INR, PROTIME in the last 168 hours. Cardiac Enzymes: No results for input(s): CKTOTAL, CKMB, CKMBINDEX, TROPONINI in the last 168 hours. BNP (last 3 results) No results for input(s): PROBNP in the last 8760 hours. HbA1C: No results for input(s): HGBA1C in the last 72 hours. CBG:  Recent Labs Lab 02/11/17 2218 02/12/17 0736 02/12/17 1103 02/12/17 1615  GLUCAP 224* 188* 164* 187*   Lipid Profile: No results for input(s): CHOL, HDL, LDLCALC, TRIG, CHOLHDL, LDLDIRECT in the last 72 hours. Thyroid Function Tests: No results for input(s): TSH, T4TOTAL, FREET4, T3FREE, THYROIDAB in the last 72 hours. Anemia Panel: No results for input(s): VITAMINB12, FOLATE, FERRITIN, TIBC, IRON, RETICCTPCT in the last 72 hours. Sepsis Labs: No results for input(s): PROCALCITON, LATICACIDVEN in the last 168 hours.  No results found for this or any previous visit (from the past 240 hour(s)).       Radiology Studies: Dg Chest 2 View  Result Date: 02/11/2017 CLINICAL DATA:  Shortness of breath. EXAM: CHEST  2 VIEW COMPARISON:  December 18, 2016 FINDINGS: No pneumothorax. Cardiomegaly identified. Small bilateral pleural effusions, unchanged since March 2017. Diffuse interstitial opacities are largely chronic. It would be difficult to exclude an acute on chronic process is a findings are more prominent when compared to March 2018. The cardiomediastinal silhouette is stable. No pneumothorax. No other change. IMPRESSION: 1. Diffuse interstitial opacities certainly have are chronic component consistent with chronic interstitial lung disease. There are also small chronic pleural effusions. An acute on chronic process such is  mild edema is not excluded as the findings are mildly more prominent when compared to March of 2018. Electronically Signed   By: Dorise Bullion III M.D   On: 02/11/2017 13:59        Scheduled Meds: . aspirin EC  162 mg Oral Daily  . atorvastatin  40 mg Oral Daily  . buPROPion  150 mg Oral BID  . doxycycline  100 mg Oral Q12H  . enoxaparin (LOVENOX) injection  40 mg Subcutaneous Q24H  . gabapentin  100 mg Oral Daily  . guaiFENesin  600 mg Oral BID  . irbesartan  150 mg Oral Daily   And  . hydrochlorothiazide  12.5 mg Oral Daily  . insulin aspart  0-15 Units Subcutaneous TID WC  . levalbuterol  0.63 mg Nebulization TID  . linaclotide  145 mcg Oral Daily  . methylPREDNISolone (SOLU-MEDROL) injection  60 mg  Intravenous Q12H  . mometasone-formoterol  2 puff Inhalation BID  . nicotine  21 mg Transdermal Daily  . pantoprazole  40 mg Oral Daily  . sodium chloride flush  3 mL Intravenous Q12H   Continuous Infusions: . sodium chloride       LOS: 0 days    Time spent: 30 minutes    Loretha Stapler, MD [facility] Pager [phone]  If 7PM-7AM, please contact night-coverage [email or web address] Password [facility] 02/12/2017, 6:24 PM

## 2017-02-12 NOTE — Progress Notes (Signed)
Initial Nutrition Assessment   INTERVENTION:  Focus on preventing unplanned wt loss  Provide soft foods which will require less chewing during symptom exacerbation  Encourage adquate fluid intake and compliance with limiting high sodium foods   NUTRITION DIAGNOSIS:   Inadequate oral intake related to  (acute exacerbation COPD) as evidenced by per patient/family report.   GOAL:   Patient will meet greater than or equal to 90% of their needs (The pt will recognize symptoms that can interfere with adequate oral intake such as shortness of breath and early satiety. She will consume high energy-nutrient rich foods when needed to compensate for decreased consumption of solid foods)   MONITOR:   PO intake, Labs, Weight trends  REASON FOR ASSESSMENT:   Consult COPD Protocol  ASSESSMENT:  Erica [name] is a 66 yo female smoker with hx of COPD, DM, HTN and CHF. She presents with shortness of breath and is on home O2 @ 2 L.   Her home diet is Regular textures and pt says she limits high salt food items. She endorses a good appetite. Her meal intake is self- reported at 25% of breakfast, 50-75% of lunch.  Weight hx reviewed and is within usual range of 76-82 kg. Her nutrition focused exam unremarkable.  She is at risk for malnutrition and decline in nutrition status with the progression of her COPD.  Strategies to prevent unwanted wt loss during times of exacerbation discussed.  Recent Labs Lab 02/11/17 1303 02/12/17 0429  NA 141 140  K 4.0 4.4  CL 104 98*  CO2 28 30  BUN 14 18  CREATININE 1.21* 1.41*  CALCIUM 9.3 9.2  GLUCOSE 123* 184*   Labs and meds reviewed.  Diet Order:  Diet heart healthy/carb modified Room service appropriate? Yes; Fluid consistency: Thin  Skin:  Reviewed, no issues  Last BM:  prior to admission  Height:   Ht Readings from Last 1 Encounters:  02/11/17 5\' 4"  (1.626 m)    Weight:   Wt Readings from Last 1 Encounters:  02/12/17 175 lb 1.6 oz  (79.4 kg)    Ideal Body Weight:  55 kg  BMI:  Body mass index is 30.06 kg/m.  Estimated Nutritional Needs:   Kcal:  1585-1717 (MSJ X1.2-1.3 AF)  Protein:  93-99 gr   Fluid:  1.6-1.7 liters daily  EDUCATION NEEDS:    (Encourage Heart Healthy [place] modified diet)  Colman Cater Erica,RD,CSG,LDN Office: [phone] Pager: [phone]

## 2017-02-12 NOTE — Progress Notes (Signed)
*  PRELIMINARY RESULTS* Echocardiogram 2D Echocardiogram has been performed.  Leavy Cella 02/12/2017, 2:54 PM

## 2017-02-12 NOTE — Progress Notes (Signed)
Pt has order to discontinue monitor. Dr. On call paged to make aware d/t pt being ST and staying in hospital one more day d/t HR in the 120s.  Dr. Hal Hope order to keep monitor on pt for now.

## 2017-02-12 NOTE — Evaluation (Signed)
Physical Therapy Evaluation Patient Details Name: Erica [name] MRN: [id number] DOB: 05-13-1951 Today's Date: 02/12/2017   History of Present Illness  66 y.o. female with medical history significant of HTN, DM, COPD on 2L home O2, chronic pain, and CHF (preserved EF with ungradeable diastolic dysfunction in 7/67) presents because "I couldn't breathe, I was short of breath".  Started last night, feet "swelled up real big" so she came over to the ER today.  +wheezing.  +cough - thick yellow-white phlegm, "sometimes it feels like it's stuck down inside and won't come up".  No fever but she has been sweating and feeling cold.  Swelling in her feet is improved with Lasix given in the ER.  Wears 2L home O2, currently still on 2L.  Dyspnea multifactorial from COPD and CHF    Clinical Impression  Pt received in bed, and is agreeable to PT evaluation.  She is normally independent with ambulation, as well as ADL's, and IADL's.  During today's PT evaluation, she was able to ambulate 25ft independently, no LOB, and negotiate portable O2 tank independently.  During ambulation SpO2 ranged from 98-90% while on 3L, and HR increased to 133bpm.  She does not demonstrate need for skilled PT at this time, therefore will d/c PT.      Follow Up Recommendations No PT follow up    Equipment Recommendations  None recommended by PT    Recommendations for Other Services       Precautions / Restrictions Precautions Precautions: None Restrictions Weight Bearing Restrictions: No      Mobility  Bed Mobility Overal bed mobility: Independent                Transfers Overall transfer level: Independent                  Ambulation/Gait Ambulation/Gait assistance: Independent Ambulation Distance (Feet): 200 Feet Assistive device: None     Gait velocity interpretation: at or above normal speed for age/gender General Gait Details: Able to negotiate portable O2 tank independently.   Stairs             Wheelchair Mobility    Modified Rankin (Stroke Patients Only)       Balance Overall balance assessment: Independent;No apparent balance deficits (not formally assessed)                                           Pertinent Vitals/Pain Pain Assessment: 0-10 Pain Location: L ankle muscle spasm - anterior tibialis area Pain Descriptors / Indicators: Cramping Pain Intervention(s): Limited activity within patient's tolerance;Monitored during session;Repositioned    Home Living   Living Arrangements:  (friend) Available Help at Discharge: Available 24 hours/day Type of Home: Mobile home Home Access: Stairs to enter Entrance Stairs-Rails: Can reach both Entrance Stairs-Number of Steps: 5 Home Layout: One level Home Equipment: Cane - single point;Other (comment) (O2 - 2L prn)      Prior Function Level of Independence: Independent   Gait / Transfers Assistance Needed: independent  ADL's / Homemaking Assistance Needed: independent  Comments: independent with driving and running errands and grocery shopping.      Hand Dominance        Extremity/Trunk Assessment   Upper Extremity Assessment Upper Extremity Assessment: Overall WFL for tasks assessed    Lower Extremity Assessment Lower Extremity Assessment: Overall WFL for tasks assessed  Communication   Communication: No difficulties  Cognition Arousal/Alertness: Awake/alert Behavior During Therapy: WFL for tasks assessed/performed Overall Cognitive Status: Within Functional Limits for tasks assessed                                        General Comments      Exercises     Assessment/Plan    PT Assessment Patent does not need any further PT services  PT Problem List         PT Treatment Interventions      PT Goals (Current goals can be found in the Care Plan section)  Acute Rehab PT Goals Patient Stated Goal: Pt wants to go home.  PT Goal  Formulation: All assessment and education complete, DC therapy    Frequency     Barriers to discharge        Co-evaluation               AM-PAC PT "6 Clicks" Daily Activity  Outcome Measure Difficulty turning over in bed (including adjusting bedclothes, sheets and blankets)?: None Difficulty moving from lying on back to sitting on the side of the bed? : None Difficulty sitting down on and standing up from a chair with arms (e.g., wheelchair, bedside commode, etc,.)?: None Help needed moving to and from a bed to chair (including a wheelchair)?: None Help needed walking in hospital room?: None Help needed climbing 3-5 steps with a railing? : None 6 Click Score: 24    End of Session Equipment Utilized During Treatment: Gait belt;Oxygen Activity Tolerance: Patient tolerated treatment well Patient left: in chair;with call bell/phone within reach Nurse Communication: Mobility status (mobility sheet left hanging in pt's room.  Encouraged mobility in the room.  ) PT Visit Diagnosis: Muscle weakness (generalized) (M62.81);Other abnormalities of gait and mobility (R26.89)    Time: 4268-3419 PT Time Calculation (min) (ACUTE ONLY): 16 min   Charges:   PT Evaluation $PT Eval Low Complexity: 1 Procedure     PT G Codes:   PT G-Codes **NOT FOR INPATIENT CLASS** Functional Assessment Tool Used: AM-PAC 6 Clicks Basic Mobility;Clinical judgement Functional Limitation: Mobility: Walking and moving around Mobility: Walking and Moving Around Current Status (Q2229): 0 percent impaired, limited or restricted Mobility: Walking and Moving Around Goal Status (N9892): 0 percent impaired, limited or restricted Mobility: Walking and Moving Around Discharge Status (J1941): 0 percent impaired, limited or restricted    Beth Yola [place], PT, DPT X: [id number]

## 2017-02-12 NOTE — Care Management Note (Signed)
Case Management Note  Patient Details  Name: Erica [name] MRN: [id number] Date of Birth: [birth year]  Subjective/Objective:                  Pt admitted with CHF. She is from home, lives with a friend. She is ind with ADL's. She has PCP, goes to there appointments regularly. She drives herself to appointments, she is not homebound. She has insurance with drug coverage and reports no difficulty affording medications. She has scale that she does not use everyday but does weigh herself every few days. She uses a cane with ambulation. She has no HH. She has home oxygen on 2lpm through [employer]. She has neb machine but says its old and not working properly, asking for new one to be ordered. She would like it ordered through [facility]. Pt communicates no other needs.   Action/Plan: Plan for return home with self care. Romualdo Bolk, of [facility], aware of DME referral. Will obtain pt info from chart and deliver to pt room prior to DC. CM will cont to follow to DC.   Expected Discharge Date:     02/16/2017             Expected Discharge Plan:  Home/Self Care  In-House Referral:  NA  Discharge planning Services  CM Consult  Post Acute Care Choice:  Durable Medical Equipment Choice offered to:  Patient  DME Arranged:  Nebulizer/meds DME Agency:  [facility].  Status of Service:  In process, will continue to follow  Sherald Barge, RN 02/12/2017, 11:07 AM

## 2017-02-12 NOTE — Discharge Summary (Signed)
Physician Discharge Summary  [name] INO:[id number] DOB: [birth year] DOA: 02/11/2017  PCP: Iona Beard, MD  Admit date: 02/11/2017 Discharge date: 02/13/2017  Admitted From:  Home  Disposition:  Home   Recommendations for Outpatient Follow-up:  1. Follow up with PCP in 1-2 weeks 2. Take prescriptions as prescribed 3. Take lasix as prescribed 4. Please obtain BMP/CBC in one week   Home Health: No Equipment/Devices: Oxygen 2L [place]   Discharge Condition: Stable  CODE STATUS: DNR  Diet recommendation: Heart Healthy  With 1537ml fluid restriction  Brief/Interim Summary: Erica [name] is a 66 y.o. female with medical history significant of HTN, DM, COPD on 2L home O2, chronic pain, and CHF (preserved EF with ungradeable diastolic dysfunction in 0/96) presents because "I couldn't breathe, I was short of breath".  Started last night, feet "swelled up real big" so she came over to the ER today.  +wheezing.  +cough - thick yellow-white phlegm, "sometimes it feels like it's stuck down inside and won't come up".  No fever but she has been sweating and feeling cold.  Swelling in her feet is improved with Lasix given in the ER.  Wears 2L home O2, currently still on 2L.  Patient was diuresed and given breathing treatments as well as antibiotics for COPD exacerbation.  She improved and was stable for discharge on 5/22.  She was given a prescription for lasix outpatient and told to follow up with her PCP for BMP and check up within 1 week.  She was also given a nebulizer machine while admitted but stated she had nebulizer medications at home.  Discharge Diagnoses:  Principal Problem:   Acute on chronic respiratory failure (HCC) Active Problems:   COPD exacerbation (HCC)   Diabetes mellitus type 2, controlled (HCC)   Benign essential HTN   Tobacco abuse   Acute on chronic diastolic CHF (congestive heart failure) (HCC)   Acute dyspnea    Discharge Instructions  Discharge Instructions     Call MD for:  difficulty breathing, headache or visual disturbances    Complete by:  As directed    Call MD for:  extreme fatigue    Complete by:  As directed    Call MD for:  hives    Complete by:  As directed    Call MD for:  persistant dizziness or light-headedness    Complete by:  As directed    Call MD for:  persistant nausea and vomiting    Complete by:  As directed    Call MD for:  severe uncontrolled pain    Complete by:  As directed    Call MD for:  temperature >100.4    Complete by:  As directed    Diet - low sodium heart healthy    Complete by:  As directed    Discharge instructions    Complete by:  As directed    Please use oxygen as instructed Use nebulizer medications in nebulizer machine Follow up with your PCP within 1 week and get blood work (metabolic panel) within 1 week Return to ED with any increased breathlessness or shortness of breath   Increase activity slowly    Complete by:  As directed      Allergies as of 02/13/2017   No Known Allergies     Medication List    TAKE these medications   albuterol (2.5 MG/3ML) 0.083% nebulizer solution Commonly known as:  PROVENTIL Take 2.5 mg by nebulization every 6 (six) hours as needed  for wheezing or shortness of breath.   albuterol-ipratropium 18-103 MCG/ACT inhaler Commonly known as:  COMBIVENT Inhale 1 puff into the lungs every 6 (six) hours as needed for wheezing or shortness of breath.   aspirin 81 MG tablet Take 2 tablets by mouth daily.   atorvastatin 40 MG tablet Commonly known as:  LIPITOR Take 40 mg by mouth daily.   budesonide-formoterol 160-4.5 MCG/ACT inhaler Commonly known as:  SYMBICORT Inhale 2 puffs into the lungs 2 (two) times daily.   buPROPion 150 MG 12 hr tablet Commonly known as:  WELLBUTRIN SR Take 150 mg by mouth 2 (two) times daily.   chlorpheniramine-HYDROcodone 10-8 MG/5ML Suer Commonly known as:  TUSSIONEX PENNKINETIC ER Take 5 mLs by mouth every 12 (twelve) hours as  needed for cough.   doxycycline 100 MG tablet Commonly known as:  ADOXA Take 1 tablet (100 mg total) by mouth 2 (two) times daily.   furosemide 20 MG tablet Commonly known as:  LASIX Take 1 tablet (20 mg total) by mouth daily. Start taking on:  02/14/2017   gabapentin 100 MG capsule Commonly known as:  NEURONTIN Take 1 capsule by mouth daily.   LINZESS 145 MCG Caps capsule Generic drug:  linaclotide Take 145 mcg by mouth daily.   metFORMIN 500 MG 24 hr tablet Commonly known as:  GLUCOPHAGE-XR Take 500 mg by mouth 2 (two) times daily.   multivitamin with minerals Tabs tablet Take 1 tablet by mouth daily.   naproxen sodium 220 MG tablet Commonly known as:  ANAPROX Take 440 mg by mouth daily as needed (pain).   omega-3 acid ethyl esters 1 g capsule Commonly known as:  LOVAZA Take 1 capsule by mouth 4 (four) times daily.   omeprazole 20 MG capsule Commonly known as:  PRILOSEC Take 20 mg by mouth daily.   ONGLYZA 5 MG Tabs tablet Generic drug:  saxagliptin HCl Take 5 mg by mouth daily. Reported on 09/22/2015   predniSONE 20 MG tablet Commonly known as:  DELTASONE Take 2 tablets (40 mg total) by mouth daily with breakfast. Start taking on:  02/14/2017   SPIRIVA HANDIHALER 18 MCG inhalation capsule Generic drug:  tiotropium Place 1 capsule into inhaler and inhale daily.   valsartan-hydrochlorothiazide 160-12.5 MG tablet Commonly known as:  DIOVAN-HCT Take 1 tablet by mouth daily.      Follow-up Information    Iona Beard, MD. Go on 02/26/2017.   Specialty:  Family Medicine Why:  at 3:15pm Contact information: [address] [place] [address] [phone]          No Known Allergies  Consultations:  CM   Procedures/Studies: Dg Chest 2 View  Result Date: 02/11/2017 CLINICAL DATA:  Shortness of breath. EXAM: CHEST  2 VIEW COMPARISON:  December 18, 2016 FINDINGS: No pneumothorax. Cardiomegaly identified. Small bilateral pleural effusions,  unchanged since March 2017. Diffuse interstitial opacities are largely chronic. It would be difficult to exclude an acute on chronic process is a findings are more prominent when compared to March 2018. The cardiomediastinal silhouette is stable. No pneumothorax. No other change. IMPRESSION: 1. Diffuse interstitial opacities certainly have are chronic component consistent with chronic interstitial lung disease. There are also small chronic pleural effusions. An acute on chronic process such is mild edema is not excluded as the findings are mildly more prominent when compared to March of 2018. Electronically Signed   By: Dorise Bullion III M.D   On: 02/11/2017 13:59   Echocardiogram: Left ventricle: The  cavity size was normal. Wall thickness was   increased in a pattern of mild LVH. Systolic function was normal.   The estimated ejection fraction was in the range of 60% to 65%.   Wall motion was normal; there were no regional wall motion   abnormalities. Doppler parameters are consistent with abnormal   left ventricular relaxation (grade 1 diastolic dysfunction).   Doppler parameters are consistent with high ventricular filling   pressure. - Mitral valve: There was mild regurgitation. - Pulmonic valve: There was mild regurgitation.   Subjective: Patient ambulating about the room.  Feels well.  Discharge Exam: Vitals:   02/12/17 2210 02/13/17 0531  BP: 118/63 99/61  Pulse: 87 93  Resp: 20 20  Temp: 98.3 F (36.8 C) 98 F (36.7 C)   Vitals:   02/12/17 2210 02/13/17 0531 02/13/17 0813 02/13/17 0823  BP: 118/63 99/61    Pulse: 87 93    Resp: 20 20    Temp: 98.3 F (36.8 C) 98 F (36.7 C)    TempSrc: Oral Oral    SpO2: 94% 97% 91% 92%  Weight:  78.9 kg (173 lb 14.4 oz)    Height:        General: Pt is alert, awake, not in acute distress Cardiovascular: RRR, S1/S2 +, no rubs, no gallops Respiratory: few end expiratory wheezes, no rhonchi Abdominal: Soft, NT, ND, bowel sounds  + Extremities: no edema, no cyanosis    The results of significant diagnostics from this hospitalization (including imaging, microbiology, ancillary and laboratory) are listed below for reference.     Microbiology: No results found for this or any previous visit (from the past 240 hour(s)).   Labs: BNP (last 3 results)  Recent Labs  02/11/17 1303  BNP 297.9*   Basic Metabolic Panel:  Recent Labs Lab 02/11/17 1303 02/12/17 0429 02/13/17 0442  NA 141 140 139  K 4.0 4.4 5.0  CL 104 98* 97*  CO2 28 30 32  GLUCOSE 123* 184* 116*  BUN 14 18 34*  CREATININE 1.21* 1.41* 1.55*  CALCIUM 9.3 9.2 8.9   Liver Function Tests: No results for input(s): AST, ALT, ALKPHOS, BILITOT, PROT, ALBUMIN in the last 168 hours. No results for input(s): LIPASE, AMYLASE in the last 168 hours. No results for input(s): AMMONIA in the last 168 hours. CBC:  Recent Labs Lab 02/11/17 1303 02/12/17 0429  WBC 5.8 5.0  NEUTROABS 3.3 4.5  HGB 12.1 12.1  HCT 36.6 37.5  MCV 81.5 81.7  PLT 223 230   Cardiac Enzymes: No results for input(s): CKTOTAL, CKMB, CKMBINDEX, TROPONINI in the last 168 hours. BNP: Invalid input(s): POCBNP CBG:  Recent Labs Lab 02/12/17 1103 02/12/17 1615 02/12/17 2018 02/13/17 0716 02/13/17 1119  GLUCAP 164* 187* 146* 115* 115*   D-Dimer No results for input(s): DDIMER in the last 72 hours. Hgb A1c  Recent Labs  02/12/17 0429  HGBA1C 5.7*   Lipid Profile No results for input(s): CHOL, HDL, LDLCALC, TRIG, CHOLHDL, LDLDIRECT in the last 72 hours. Thyroid function studies No results for input(s): TSH, T4TOTAL, T3FREE, THYROIDAB in the last 72 hours.  Invalid input(s): FREET3 Anemia work up No results for input(s): VITAMINB12, FOLATE, FERRITIN, TIBC, IRON, RETICCTPCT in the last 72 hours. Urinalysis    Component Value Date/Time   COLORURINE YELLOW 03/14/2013 1847   APPEARANCEUR CLEAR 03/14/2013 1847   LABSPEC 1.025 03/14/2013 1847   PHURINE 6.0  03/14/2013 1847   GLUCOSEU NEGATIVE 03/14/2013 1847   HGBUR NEGATIVE 03/14/2013 1847  BILIRUBINUR NEGATIVE 03/14/2013 1847   KETONESUR NEGATIVE 03/14/2013 1847   PROTEINUR NEGATIVE 03/14/2013 1847   UROBILINOGEN 0.2 03/14/2013 1847   NITRITE NEGATIVE 03/14/2013 1847   LEUKOCYTESUR NEGATIVE 03/14/2013 1847   Sepsis Labs Invalid input(s): PROCALCITONIN,  WBC,  LACTICIDVEN Microbiology No results found for this or any previous visit (from the past 240 hour(s)).   Time coordinating discharge: Over 30 minutes  SIGNED:   Loretha Stapler, MD  [facility] 02/13/2017, 6:43 PM Pager [phone] If 7PM-7AM, please contact night-coverage [email or web address] Password [email or web address]

## 2017-02-12 NOTE — Care Management Obs Status (Signed)
[place] NOTIFICATION   Patient Details  Name: Erica [name] MRN: [id number] Date of Birth: 02/13/1951   Medicare Observation Status Notification Given:  Yes    Sherald Barge, RN 02/12/2017, 11:06 AM

## 2017-02-13 DIAGNOSIS — I110 Hypertensive heart disease with heart failure: Secondary | ICD-10-CM | POA: Diagnosis not present

## 2017-02-13 DIAGNOSIS — E119 Type 2 diabetes mellitus without complications: Secondary | ICD-10-CM | POA: Diagnosis not present

## 2017-02-13 DIAGNOSIS — J9610 Chronic respiratory failure, unspecified whether with hypoxia or hypercapnia: Secondary | ICD-10-CM | POA: Diagnosis not present

## 2017-02-13 DIAGNOSIS — K219 Gastro-esophageal reflux disease without esophagitis: Secondary | ICD-10-CM | POA: Diagnosis not present

## 2017-02-13 DIAGNOSIS — J9622 Acute and chronic respiratory failure with hypercapnia: Secondary | ICD-10-CM | POA: Diagnosis not present

## 2017-02-13 DIAGNOSIS — Z9981 Dependence on supplemental oxygen: Secondary | ICD-10-CM | POA: Diagnosis not present

## 2017-02-13 DIAGNOSIS — R0600 Dyspnea, unspecified: Secondary | ICD-10-CM | POA: Diagnosis not present

## 2017-02-13 DIAGNOSIS — J9621 Acute and chronic respiratory failure with hypoxia: Secondary | ICD-10-CM | POA: Diagnosis not present

## 2017-02-13 DIAGNOSIS — J441 Chronic obstructive pulmonary disease with (acute) exacerbation: Secondary | ICD-10-CM | POA: Diagnosis not present

## 2017-02-13 DIAGNOSIS — I5033 Acute on chronic diastolic (congestive) heart failure: Secondary | ICD-10-CM | POA: Diagnosis not present

## 2017-02-13 DIAGNOSIS — N179 Acute kidney failure, unspecified: Secondary | ICD-10-CM | POA: Diagnosis not present

## 2017-02-13 LAB — GLUCOSE, CAPILLARY
GLUCOSE-CAPILLARY: 115 mg/dL — AB (ref 65–99)
GLUCOSE-CAPILLARY: 115 mg/dL — AB (ref 65–99)

## 2017-02-13 LAB — BASIC METABOLIC PANEL
Anion gap: 10 (ref 5–15)
BUN: 34 mg/dL — ABNORMAL HIGH (ref 6–20)
CALCIUM: 8.9 mg/dL (ref 8.9–10.3)
CO2: 32 mmol/L (ref 22–32)
Chloride: 97 mmol/L — ABNORMAL LOW (ref 101–111)
Creatinine, Ser: 1.55 mg/dL — ABNORMAL HIGH (ref 0.44–1.00)
GFR calc non Af Amer: 34 mL/min — ABNORMAL LOW (ref >60)
GFR, EST AFRICAN AMERICAN: 39 mL/min — AB (ref >60)
Glucose, Bld: 116 mg/dL — ABNORMAL HIGH (ref 65–99)
Potassium: 5 mmol/L (ref 3.5–5.1)
SODIUM: 139 mmol/L (ref 135–145)

## 2017-02-13 LAB — HEMOGLOBIN A1C
Hgb A1c MFr Bld: 5.7 % — ABNORMAL HIGH (ref 4.8–5.6)
MEAN PLASMA GLUCOSE: 117 mg/dL

## 2017-02-13 MED ORDER — PREDNISONE 20 MG PO TABS: 40.0000 mg | ORAL_TABLET | Freq: Every day | ORAL | 0 refills | Status: AC

## 2017-02-13 MED ORDER — FUROSEMIDE 20 MG PO TABS: 20.0000 mg | ORAL_TABLET | Freq: Every day | ORAL | 0 refills | Status: DC

## 2017-02-13 MED ORDER — DOXYCYCLINE MONOHYDRATE 100 MG PO TABS: 100.0000 mg | ORAL_TABLET | Freq: Two times a day (BID) | ORAL | 0 refills | Status: AC

## 2017-02-13 NOTE — Progress Notes (Signed)
OT Cancellation Note  Patient Details Name: Erica [name] MRN: [id number] DOB: 10-Mar-1951   Cancelled Treatment:     Reason evaluation not completed: Chart reviewed, spoke with PT regarding evaluation. Pt demonstrating independence in ADL and functional mobility tasks, is at baseline independence. No OT needs at this time.   Guadelupe Sabin, OTR/L  [phone] 02/13/2017, 8:17 AM

## 2017-02-13 NOTE — Care Management Note (Signed)
Case Management Note  Patient Details  Name: Erica [name] MRN: [id number] Date of Birth: 1951/03/29  Expected Discharge Date:  02/13/17               Expected Discharge Plan:  Home/Self Care  In-House Referral:  NA  Discharge planning Services  CM Consult  Post Acute Care Choice:  Durable Medical Equipment Choice offered to:  Patient  DME Arranged:  Nebulizer/meds DME Agency:  [employer]  Status of Service:  Completed, signed off  Additional Comments: Pt discharging home today with self care. Neb machine delivered to pt room prior to DC.   Sherald Barge, RN 02/13/2017, 11:26 AM

## 2017-02-13 NOTE — Progress Notes (Signed)
Patient is to be discharged home and in stable condition. Patient given discharge instructions and verbalizes understanding. Patient's sister will be taking patient home and will bring patient's oxygen to the room upon arrival. Patient will be escorted out by staff via wheelchair upon sister's arrival.  Celestia Khat, RN

## 2017-02-25 DIAGNOSIS — J449 Chronic obstructive pulmonary disease, unspecified: Secondary | ICD-10-CM | POA: Diagnosis not present

## 2017-02-26 DIAGNOSIS — I5021 Acute systolic (congestive) heart failure: Secondary | ICD-10-CM | POA: Diagnosis not present

## 2017-02-26 DIAGNOSIS — I509 Heart failure, unspecified: Secondary | ICD-10-CM | POA: Diagnosis not present

## 2017-02-26 DIAGNOSIS — J449 Chronic obstructive pulmonary disease, unspecified: Secondary | ICD-10-CM | POA: Diagnosis not present

## 2017-02-26 DIAGNOSIS — Z6829 Body mass index (BMI) 29.0-29.9, adult: Secondary | ICD-10-CM | POA: Diagnosis not present

## 2017-02-26 DIAGNOSIS — E118 Type 2 diabetes mellitus with unspecified complications: Secondary | ICD-10-CM | POA: Diagnosis not present

## 2017-03-16 DIAGNOSIS — J9610 Chronic respiratory failure, unspecified whether with hypoxia or hypercapnia: Secondary | ICD-10-CM | POA: Diagnosis not present

## 2017-03-16 DIAGNOSIS — J441 Chronic obstructive pulmonary disease with (acute) exacerbation: Secondary | ICD-10-CM | POA: Diagnosis not present

## 2017-03-26 DIAGNOSIS — F17200 Nicotine dependence, unspecified, uncomplicated: Secondary | ICD-10-CM | POA: Diagnosis not present

## 2017-03-26 DIAGNOSIS — J449 Chronic obstructive pulmonary disease, unspecified: Secondary | ICD-10-CM | POA: Diagnosis not present

## 2017-03-26 DIAGNOSIS — Z9981 Dependence on supplemental oxygen: Secondary | ICD-10-CM | POA: Diagnosis not present

## 2017-03-26 DIAGNOSIS — E118 Type 2 diabetes mellitus with unspecified complications: Secondary | ICD-10-CM | POA: Diagnosis not present

## 2017-03-27 DIAGNOSIS — J449 Chronic obstructive pulmonary disease, unspecified: Secondary | ICD-10-CM | POA: Diagnosis not present

## 2017-04-15 DIAGNOSIS — J441 Chronic obstructive pulmonary disease with (acute) exacerbation: Secondary | ICD-10-CM | POA: Diagnosis not present

## 2017-04-15 DIAGNOSIS — J9610 Chronic respiratory failure, unspecified whether with hypoxia or hypercapnia: Secondary | ICD-10-CM | POA: Diagnosis not present

## 2017-04-20 DIAGNOSIS — E119 Type 2 diabetes mellitus without complications: Secondary | ICD-10-CM | POA: Diagnosis not present

## 2017-04-20 DIAGNOSIS — I10 Essential (primary) hypertension: Secondary | ICD-10-CM | POA: Diagnosis not present

## 2017-04-20 DIAGNOSIS — Z79899 Other long term (current) drug therapy: Secondary | ICD-10-CM | POA: Diagnosis not present

## 2017-04-20 DIAGNOSIS — E782 Mixed hyperlipidemia: Secondary | ICD-10-CM | POA: Diagnosis not present

## 2017-04-24 DIAGNOSIS — J449 Chronic obstructive pulmonary disease, unspecified: Secondary | ICD-10-CM | POA: Diagnosis not present

## 2017-04-24 DIAGNOSIS — E119 Type 2 diabetes mellitus without complications: Secondary | ICD-10-CM | POA: Diagnosis not present

## 2017-04-24 DIAGNOSIS — Z0000 Encounter for general adult medical examination without abnormal findings: Secondary | ICD-10-CM | POA: Diagnosis not present

## 2017-04-24 DIAGNOSIS — Z79899 Other long term (current) drug therapy: Secondary | ICD-10-CM | POA: Diagnosis not present

## 2017-04-24 DIAGNOSIS — F17200 Nicotine dependence, unspecified, uncomplicated: Secondary | ICD-10-CM | POA: Diagnosis not present

## 2017-04-24 DIAGNOSIS — I10 Essential (primary) hypertension: Secondary | ICD-10-CM | POA: Diagnosis not present

## 2017-04-24 DIAGNOSIS — Z720 Tobacco use: Secondary | ICD-10-CM | POA: Diagnosis not present

## 2017-04-24 DIAGNOSIS — E782 Mixed hyperlipidemia: Secondary | ICD-10-CM | POA: Diagnosis not present

## 2017-04-27 DIAGNOSIS — J449 Chronic obstructive pulmonary disease, unspecified: Secondary | ICD-10-CM | POA: Diagnosis not present

## 2017-05-02 DIAGNOSIS — R0902 Hypoxemia: Secondary | ICD-10-CM | POA: Diagnosis not present

## 2017-05-02 DIAGNOSIS — J984 Other disorders of lung: Secondary | ICD-10-CM | POA: Diagnosis not present

## 2017-05-02 DIAGNOSIS — R0602 Shortness of breath: Secondary | ICD-10-CM | POA: Diagnosis not present

## 2017-05-02 DIAGNOSIS — Z720 Tobacco use: Secondary | ICD-10-CM | POA: Diagnosis not present

## 2017-05-02 DIAGNOSIS — D869 Sarcoidosis, unspecified: Secondary | ICD-10-CM | POA: Diagnosis not present

## 2017-05-02 DIAGNOSIS — J449 Chronic obstructive pulmonary disease, unspecified: Secondary | ICD-10-CM | POA: Diagnosis not present

## 2017-05-02 DIAGNOSIS — R938 Abnormal findings on diagnostic imaging of other specified body structures: Secondary | ICD-10-CM | POA: Diagnosis not present

## 2017-05-14 DIAGNOSIS — Z720 Tobacco use: Secondary | ICD-10-CM | POA: Diagnosis not present

## 2017-05-14 DIAGNOSIS — J449 Chronic obstructive pulmonary disease, unspecified: Secondary | ICD-10-CM | POA: Diagnosis not present

## 2017-05-14 DIAGNOSIS — Z0000 Encounter for general adult medical examination without abnormal findings: Secondary | ICD-10-CM | POA: Diagnosis not present

## 2017-05-14 DIAGNOSIS — Z9981 Dependence on supplemental oxygen: Secondary | ICD-10-CM | POA: Diagnosis not present

## 2017-05-14 DIAGNOSIS — M545 Low back pain: Secondary | ICD-10-CM | POA: Diagnosis not present

## 2017-05-14 DIAGNOSIS — E118 Type 2 diabetes mellitus with unspecified complications: Secondary | ICD-10-CM | POA: Diagnosis not present

## 2017-05-16 DIAGNOSIS — J441 Chronic obstructive pulmonary disease with (acute) exacerbation: Secondary | ICD-10-CM | POA: Diagnosis not present

## 2017-05-16 DIAGNOSIS — J9610 Chronic respiratory failure, unspecified whether with hypoxia or hypercapnia: Secondary | ICD-10-CM | POA: Diagnosis not present

## 2017-05-20 DIAGNOSIS — J449 Chronic obstructive pulmonary disease, unspecified: Secondary | ICD-10-CM | POA: Diagnosis not present

## 2017-05-20 DIAGNOSIS — I638 Other cerebral infarction: Secondary | ICD-10-CM | POA: Diagnosis not present

## 2017-05-20 DIAGNOSIS — R51 Headache: Secondary | ICD-10-CM | POA: Diagnosis not present

## 2017-05-20 DIAGNOSIS — E118 Type 2 diabetes mellitus with unspecified complications: Secondary | ICD-10-CM | POA: Diagnosis not present

## 2017-05-20 DIAGNOSIS — I10 Essential (primary) hypertension: Secondary | ICD-10-CM | POA: Diagnosis not present

## 2017-05-20 DIAGNOSIS — Z716 Tobacco abuse counseling: Secondary | ICD-10-CM | POA: Diagnosis not present

## 2017-05-20 DIAGNOSIS — Z9981 Dependence on supplemental oxygen: Secondary | ICD-10-CM | POA: Diagnosis not present

## 2017-05-20 DIAGNOSIS — I340 Nonrheumatic mitral (valve) insufficiency: Secondary | ICD-10-CM | POA: Diagnosis not present

## 2017-05-20 DIAGNOSIS — Z6830 Body mass index (BMI) 30.0-30.9, adult: Secondary | ICD-10-CM | POA: Diagnosis not present

## 2017-05-20 DIAGNOSIS — E119 Type 2 diabetes mellitus without complications: Secondary | ICD-10-CM | POA: Diagnosis not present

## 2017-05-20 DIAGNOSIS — F17210 Nicotine dependence, cigarettes, uncomplicated: Secondary | ICD-10-CM | POA: Diagnosis not present

## 2017-05-20 DIAGNOSIS — I639 Cerebral infarction, unspecified: Secondary | ICD-10-CM | POA: Diagnosis not present

## 2017-05-28 DIAGNOSIS — J449 Chronic obstructive pulmonary disease, unspecified: Secondary | ICD-10-CM | POA: Diagnosis not present

## 2017-05-30 DIAGNOSIS — J449 Chronic obstructive pulmonary disease, unspecified: Secondary | ICD-10-CM | POA: Diagnosis not present

## 2017-05-30 DIAGNOSIS — Z8673 Personal history of transient ischemic attack (TIA), and cerebral infarction without residual deficits: Secondary | ICD-10-CM | POA: Diagnosis not present

## 2017-05-30 DIAGNOSIS — E118 Type 2 diabetes mellitus with unspecified complications: Secondary | ICD-10-CM | POA: Diagnosis not present

## 2017-05-30 DIAGNOSIS — I10 Essential (primary) hypertension: Secondary | ICD-10-CM | POA: Diagnosis not present

## 2017-06-05 DIAGNOSIS — J441 Chronic obstructive pulmonary disease with (acute) exacerbation: Secondary | ICD-10-CM | POA: Diagnosis not present

## 2017-06-16 DIAGNOSIS — J9610 Chronic respiratory failure, unspecified whether with hypoxia or hypercapnia: Secondary | ICD-10-CM | POA: Diagnosis not present

## 2017-06-16 DIAGNOSIS — J441 Chronic obstructive pulmonary disease with (acute) exacerbation: Secondary | ICD-10-CM | POA: Diagnosis not present

## 2017-06-27 DIAGNOSIS — J449 Chronic obstructive pulmonary disease, unspecified: Secondary | ICD-10-CM | POA: Diagnosis not present

## 2017-07-16 DIAGNOSIS — J9610 Chronic respiratory failure, unspecified whether with hypoxia or hypercapnia: Secondary | ICD-10-CM | POA: Diagnosis not present

## 2017-07-16 DIAGNOSIS — J441 Chronic obstructive pulmonary disease with (acute) exacerbation: Secondary | ICD-10-CM | POA: Diagnosis not present

## 2017-07-18 DIAGNOSIS — J441 Chronic obstructive pulmonary disease with (acute) exacerbation: Secondary | ICD-10-CM | POA: Diagnosis not present

## 2017-07-28 DIAGNOSIS — J449 Chronic obstructive pulmonary disease, unspecified: Secondary | ICD-10-CM | POA: Diagnosis not present

## 2017-08-03 DIAGNOSIS — B029 Zoster without complications: Secondary | ICD-10-CM | POA: Diagnosis not present

## 2017-08-16 DIAGNOSIS — J441 Chronic obstructive pulmonary disease with (acute) exacerbation: Secondary | ICD-10-CM | POA: Diagnosis not present

## 2017-08-16 DIAGNOSIS — J9610 Chronic respiratory failure, unspecified whether with hypoxia or hypercapnia: Secondary | ICD-10-CM | POA: Diagnosis not present

## 2017-08-27 ENCOUNTER — Emergency Department (HOSPITAL_COMMUNITY): Payer: Medicare HMO

## 2017-08-27 ENCOUNTER — Inpatient Hospital Stay (HOSPITAL_COMMUNITY)
Admission: EM | Admit: 2017-08-27 | Discharge: 2017-08-30 | DRG: 391 | Disposition: A | Discharge status: Home / Self Care | Payer: Medicare HMO | Attending: Internal Medicine | Admitting: Internal Medicine

## 2017-08-27 ENCOUNTER — Encounter (HOSPITAL_COMMUNITY): Payer: Self-pay | Admitting: Emergency Medicine

## 2017-08-27 ENCOUNTER — Other Ambulatory Visit: Payer: Self-pay

## 2017-08-27 ENCOUNTER — Inpatient Hospital Stay (HOSPITAL_COMMUNITY): Payer: Medicare HMO

## 2017-08-27 DIAGNOSIS — R195 Other fecal abnormalities: Secondary | ICD-10-CM | POA: Diagnosis not present

## 2017-08-27 DIAGNOSIS — Z7982 Long term (current) use of aspirin: Secondary | ICD-10-CM | POA: Diagnosis not present

## 2017-08-27 DIAGNOSIS — R0902 Hypoxemia: Secondary | ICD-10-CM | POA: Diagnosis not present

## 2017-08-27 DIAGNOSIS — F17210 Nicotine dependence, cigarettes, uncomplicated: Secondary | ICD-10-CM | POA: Diagnosis present

## 2017-08-27 DIAGNOSIS — Z7984 Long term (current) use of oral hypoglycemic drugs: Secondary | ICD-10-CM | POA: Diagnosis not present

## 2017-08-27 DIAGNOSIS — I248 Other forms of acute ischemic heart disease: Secondary | ICD-10-CM | POA: Diagnosis present

## 2017-08-27 DIAGNOSIS — J9601 Acute respiratory failure with hypoxia: Secondary | ICD-10-CM | POA: Diagnosis present

## 2017-08-27 DIAGNOSIS — Z7902 Long term (current) use of antithrombotics/antiplatelets: Secondary | ICD-10-CM

## 2017-08-27 DIAGNOSIS — R778 Other specified abnormalities of plasma proteins: Secondary | ICD-10-CM | POA: Diagnosis present

## 2017-08-27 DIAGNOSIS — D649 Anemia, unspecified: Secondary | ICD-10-CM | POA: Diagnosis not present

## 2017-08-27 DIAGNOSIS — Z7951 Long term (current) use of inhaled steroids: Secondary | ICD-10-CM | POA: Diagnosis not present

## 2017-08-27 DIAGNOSIS — Z6830 Body mass index (BMI) 30.0-30.9, adult: Secondary | ICD-10-CM

## 2017-08-27 DIAGNOSIS — I130 Hypertensive heart and chronic kidney disease with heart failure and stage 1 through stage 4 chronic kidney disease, or unspecified chronic kidney disease: Secondary | ICD-10-CM | POA: Diagnosis not present

## 2017-08-27 DIAGNOSIS — E1152 Type 2 diabetes mellitus with diabetic peripheral angiopathy with gangrene: Secondary | ICD-10-CM | POA: Diagnosis not present

## 2017-08-27 DIAGNOSIS — I5032 Chronic diastolic (congestive) heart failure: Secondary | ICD-10-CM | POA: Diagnosis not present

## 2017-08-27 DIAGNOSIS — K5732 Diverticulitis of large intestine without perforation or abscess without bleeding: Principal | ICD-10-CM | POA: Diagnosis present

## 2017-08-27 DIAGNOSIS — K5790 Diverticulosis of intestine, part unspecified, without perforation or abscess without bleeding: Secondary | ICD-10-CM | POA: Diagnosis not present

## 2017-08-27 DIAGNOSIS — J8410 Pulmonary fibrosis, unspecified: Secondary | ICD-10-CM | POA: Diagnosis present

## 2017-08-27 DIAGNOSIS — N183 Chronic kidney disease, stage 3 (moderate): Secondary | ICD-10-CM | POA: Diagnosis present

## 2017-08-27 DIAGNOSIS — I471 Supraventricular tachycardia: Secondary | ICD-10-CM | POA: Diagnosis not present

## 2017-08-27 DIAGNOSIS — J9610 Chronic respiratory failure, unspecified whether with hypoxia or hypercapnia: Secondary | ICD-10-CM | POA: Diagnosis not present

## 2017-08-27 DIAGNOSIS — I5030 Unspecified diastolic (congestive) heart failure: Secondary | ICD-10-CM | POA: Diagnosis not present

## 2017-08-27 DIAGNOSIS — R0602 Shortness of breath: Secondary | ICD-10-CM

## 2017-08-27 DIAGNOSIS — E1129 Type 2 diabetes mellitus with other diabetic kidney complication: Secondary | ICD-10-CM | POA: Diagnosis not present

## 2017-08-27 DIAGNOSIS — K922 Gastrointestinal hemorrhage, unspecified: Secondary | ICD-10-CM | POA: Diagnosis not present

## 2017-08-27 DIAGNOSIS — B029 Zoster without complications: Secondary | ICD-10-CM | POA: Diagnosis present

## 2017-08-27 DIAGNOSIS — Z9981 Dependence on supplemental oxygen: Secondary | ICD-10-CM | POA: Diagnosis not present

## 2017-08-27 DIAGNOSIS — J441 Chronic obstructive pulmonary disease with (acute) exacerbation: Secondary | ICD-10-CM | POA: Diagnosis not present

## 2017-08-27 DIAGNOSIS — K449 Diaphragmatic hernia without obstruction or gangrene: Secondary | ICD-10-CM | POA: Diagnosis present

## 2017-08-27 DIAGNOSIS — J449 Chronic obstructive pulmonary disease, unspecified: Secondary | ICD-10-CM | POA: Diagnosis not present

## 2017-08-27 DIAGNOSIS — J9620 Acute and chronic respiratory failure, unspecified whether with hypoxia or hypercapnia: Secondary | ICD-10-CM | POA: Diagnosis not present

## 2017-08-27 DIAGNOSIS — I10 Essential (primary) hypertension: Secondary | ICD-10-CM | POA: Diagnosis present

## 2017-08-27 DIAGNOSIS — E669 Obesity, unspecified: Secondary | ICD-10-CM | POA: Diagnosis present

## 2017-08-27 DIAGNOSIS — K5792 Diverticulitis of intestine, part unspecified, without perforation or abscess without bleeding: Secondary | ICD-10-CM | POA: Diagnosis not present

## 2017-08-27 DIAGNOSIS — E1165 Type 2 diabetes mellitus with hyperglycemia: Secondary | ICD-10-CM

## 2017-08-27 DIAGNOSIS — E1122 Type 2 diabetes mellitus with diabetic chronic kidney disease: Secondary | ICD-10-CM | POA: Diagnosis present

## 2017-08-27 DIAGNOSIS — T380X5A Adverse effect of glucocorticoids and synthetic analogues, initial encounter: Secondary | ICD-10-CM | POA: Diagnosis present

## 2017-08-27 DIAGNOSIS — Z720 Tobacco use: Secondary | ICD-10-CM | POA: Diagnosis not present

## 2017-08-27 DIAGNOSIS — M549 Dorsalgia, unspecified: Secondary | ICD-10-CM | POA: Diagnosis present

## 2017-08-27 DIAGNOSIS — D500 Iron deficiency anemia secondary to blood loss (chronic): Secondary | ICD-10-CM | POA: Diagnosis not present

## 2017-08-27 DIAGNOSIS — R05 Cough: Secondary | ICD-10-CM | POA: Diagnosis not present

## 2017-08-27 DIAGNOSIS — D509 Iron deficiency anemia, unspecified: Secondary | ICD-10-CM | POA: Diagnosis not present

## 2017-08-27 DIAGNOSIS — E119 Type 2 diabetes mellitus without complications: Secondary | ICD-10-CM

## 2017-08-27 DIAGNOSIS — I340 Nonrheumatic mitral (valve) insufficiency: Secondary | ICD-10-CM | POA: Diagnosis not present

## 2017-08-27 DIAGNOSIS — I509 Heart failure, unspecified: Secondary | ICD-10-CM | POA: Diagnosis not present

## 2017-08-27 DIAGNOSIS — G894 Chronic pain syndrome: Secondary | ICD-10-CM | POA: Diagnosis not present

## 2017-08-27 DIAGNOSIS — J9621 Acute and chronic respiratory failure with hypoxia: Secondary | ICD-10-CM | POA: Diagnosis not present

## 2017-08-27 DIAGNOSIS — R7989 Other specified abnormal findings of blood chemistry: Secondary | ICD-10-CM

## 2017-08-27 DIAGNOSIS — R1013 Epigastric pain: Secondary | ICD-10-CM | POA: Diagnosis not present

## 2017-08-27 DIAGNOSIS — R748 Abnormal levels of other serum enzymes: Secondary | ICD-10-CM | POA: Diagnosis not present

## 2017-08-27 HISTORY — DX: Unspecified osteoarthritis, unspecified site: M19.90

## 2017-08-27 LAB — BASIC METABOLIC PANEL
Anion gap: 8 (ref 5–15)
BUN: 25 mg/dL — AB (ref 6–20)
CALCIUM: 8.8 mg/dL — AB (ref 8.9–10.3)
CHLORIDE: 104 mmol/L (ref 101–111)
CO2: 28 mmol/L (ref 22–32)
CREATININE: 1.62 mg/dL — AB (ref 0.44–1.00)
GFR calc Af Amer: 37 mL/min — ABNORMAL LOW (ref >60)
GFR calc non Af Amer: 32 mL/min — ABNORMAL LOW (ref >60)
Glucose, Bld: 112 mg/dL — ABNORMAL HIGH (ref 65–99)
Potassium: 4.1 mmol/L (ref 3.5–5.1)
SODIUM: 140 mmol/L (ref 135–145)

## 2017-08-27 LAB — CBC WITH DIFFERENTIAL/PLATELET
BASOS ABS: 0 10*3/uL (ref 0.0–0.1)
Basophils Relative: 0 %
EOS PCT: 4 %
Eosinophils Absolute: 0.3 10*3/uL (ref 0.0–0.7)
HEMATOCRIT: 25.3 % — AB (ref 36.0–46.0)
Hemoglobin: 7.5 g/dL — ABNORMAL LOW (ref 12.0–15.0)
LYMPHS PCT: 25 %
Lymphs Abs: 2.1 10*3/uL (ref 0.7–4.0)
MCH: 22.7 pg — AB (ref 26.0–34.0)
MCHC: 29.6 g/dL — AB (ref 30.0–36.0)
MCV: 76.7 fL — AB (ref 78.0–100.0)
MONO ABS: 0.6 10*3/uL (ref 0.1–1.0)
MONOS PCT: 7 %
NEUTROS ABS: 5.4 10*3/uL (ref 1.7–7.7)
Neutrophils Relative %: 64 %
PLATELETS: 277 10*3/uL (ref 150–400)
RBC: 3.3 MIL/uL — ABNORMAL LOW (ref 3.87–5.11)
RDW: 17.2 % — AB (ref 11.5–15.5)
WBC: 8.4 10*3/uL (ref 4.0–10.5)

## 2017-08-27 LAB — TROPONIN I
TROPONIN I: 0.05 ng/mL — AB (ref <0.03)
Troponin I: 0.06 ng/mL (ref <0.03)

## 2017-08-27 LAB — BRAIN NATRIURETIC PEPTIDE: B NATRIURETIC PEPTIDE 5: 100 pg/mL (ref 0.0–100.0)

## 2017-08-27 MED ORDER — HYDRALAZINE HCL 20 MG/ML IJ SOLN: 5.0000 mg | INTRAMUSCULAR | Status: DC | PRN

## 2017-08-27 MED ORDER — METRONIDAZOLE IN NACL 5-0.79 MG/ML-% IV SOLN
500.0000 mg | Freq: Three times a day (TID) | INTRAVENOUS | Status: DC
  Administered 2017-08-27 – 2017-08-30 (×8): 500 mg via INTRAVENOUS
  Filled 2017-08-27 (×8): qty 100

## 2017-08-27 MED ORDER — METHYLPREDNISOLONE SODIUM SUCC 125 MG IJ SOLR
125.0000 mg | Freq: Once | INTRAMUSCULAR | Status: AC
  Administered 2017-08-27: 125 mg via INTRAVENOUS
  Filled 2017-08-27: qty 2

## 2017-08-27 MED ORDER — PANTOPRAZOLE SODIUM 40 MG IV SOLR
40.0000 mg | Freq: Two times a day (BID) | INTRAVENOUS | Status: DC
  Administered 2017-08-27 – 2017-08-30 (×6): 40 mg via INTRAVENOUS
  Filled 2017-08-27 (×6): qty 40

## 2017-08-27 MED ORDER — FAMOTIDINE 20 MG PO TABS
20.0000 mg | ORAL_TABLET | Freq: Two times a day (BID) | ORAL | Status: DC
  Administered 2017-08-27 – 2017-08-30 (×6): 20 mg via ORAL
  Filled 2017-08-27 (×6): qty 1

## 2017-08-27 MED ORDER — ATORVASTATIN CALCIUM 40 MG PO TABS
80.0000 mg | ORAL_TABLET | Freq: Every day | ORAL | Status: DC
  Administered 2017-08-27 – 2017-08-29 (×3): 80 mg via ORAL
  Filled 2017-08-27 (×4): qty 2

## 2017-08-27 MED ORDER — ONDANSETRON HCL 4 MG/2ML IJ SOLN: 4.0000 mg | Freq: Four times a day (QID) | INTRAMUSCULAR | Status: DC | PRN

## 2017-08-27 MED ORDER — ALBUTEROL SULFATE (2.5 MG/3ML) 0.083% IN NEBU: 5.0000 mg | INHALATION_SOLUTION | Freq: Once | RESPIRATORY_TRACT | Status: DC

## 2017-08-27 MED ORDER — IPRATROPIUM BROMIDE 0.02 % IN SOLN
1.0000 mg | Freq: Once | RESPIRATORY_TRACT | Status: AC
  Administered 2017-08-27: 1 mg via RESPIRATORY_TRACT
  Filled 2017-08-27: qty 5

## 2017-08-27 MED ORDER — ONDANSETRON HCL 4 MG PO TABS: 4.0000 mg | ORAL_TABLET | Freq: Four times a day (QID) | ORAL | Status: DC | PRN

## 2017-08-27 MED ORDER — ACETAMINOPHEN 650 MG RE SUPP: 650.0000 mg | Freq: Four times a day (QID) | RECTAL | Status: DC | PRN

## 2017-08-27 MED ORDER — MORPHINE SULFATE (PF) 2 MG/ML IV SOLN: 2.0000 mg | INTRAVENOUS | Status: DC | PRN

## 2017-08-27 MED ORDER — METHYLPREDNISOLONE SODIUM SUCC 125 MG IJ SOLR
60.0000 mg | Freq: Two times a day (BID) | INTRAMUSCULAR | Status: DC
  Administered 2017-08-27 – 2017-08-30 (×5): 60 mg via INTRAVENOUS
  Filled 2017-08-27 (×5): qty 2

## 2017-08-27 MED ORDER — IOPAMIDOL (ISOVUE-300) INJECTION 61%
100.0000 mL | Freq: Once | INTRAVENOUS | Status: AC | PRN
  Administered 2017-08-27: 75 mL via INTRAVENOUS

## 2017-08-27 MED ORDER — ALBUTEROL SULFATE (2.5 MG/3ML) 0.083% IN NEBU: 2.5000 mg | INHALATION_SOLUTION | RESPIRATORY_TRACT | Status: DC | PRN

## 2017-08-27 MED ORDER — ACETAMINOPHEN 325 MG PO TABS: 650.0000 mg | ORAL_TABLET | Freq: Four times a day (QID) | ORAL | Status: DC | PRN

## 2017-08-27 MED ORDER — CIPROFLOXACIN IN D5W 400 MG/200ML IV SOLN
400.0000 mg | Freq: Two times a day (BID) | INTRAVENOUS | Status: DC
  Administered 2017-08-27 – 2017-08-30 (×6): 400 mg via INTRAVENOUS
  Filled 2017-08-27 (×6): qty 200

## 2017-08-27 MED ORDER — NICOTINE 14 MG/24HR TD PT24
14.0000 mg | MEDICATED_PATCH | Freq: Every day | TRANSDERMAL | Status: DC
  Administered 2017-08-28 – 2017-08-30 (×3): 14 mg via TRANSDERMAL
  Filled 2017-08-27 (×3): qty 1

## 2017-08-27 MED ORDER — INSULIN ASPART 100 UNIT/ML ~~LOC~~ SOLN
0.0000 [IU] | Freq: Three times a day (TID) | SUBCUTANEOUS | Status: DC
  Administered 2017-08-28 – 2017-08-29 (×3): 3 [IU] via SUBCUTANEOUS
  Administered 2017-08-29: 2 [IU] via SUBCUTANEOUS
  Administered 2017-08-30: 3 [IU] via SUBCUTANEOUS

## 2017-08-27 MED ORDER — LACTATED RINGERS IV SOLN
INTRAVENOUS | Status: DC
  Administered 2017-08-27 – 2017-08-28 (×3): via INTRAVENOUS

## 2017-08-27 MED ORDER — BUPROPION HCL ER (SR) 150 MG PO TB12
150.0000 mg | ORAL_TABLET | Freq: Two times a day (BID) | ORAL | Status: DC
  Administered 2017-08-28 – 2017-08-30 (×4): 150 mg via ORAL
  Filled 2017-08-27 (×5): qty 1

## 2017-08-27 MED ORDER — ALBUTEROL (5 MG/ML) CONTINUOUS INHALATION SOLN
10.0000 mg/h | INHALATION_SOLUTION | Freq: Once | RESPIRATORY_TRACT | Status: AC
  Administered 2017-08-27: 10 mg/h via RESPIRATORY_TRACT
  Filled 2017-08-27: qty 20

## 2017-08-27 MED ORDER — IPRATROPIUM-ALBUTEROL 0.5-2.5 (3) MG/3ML IN SOLN
3.0000 mL | Freq: Four times a day (QID) | RESPIRATORY_TRACT | Status: DC
  Administered 2017-08-28: 3 mL via RESPIRATORY_TRACT
  Filled 2017-08-27: qty 3

## 2017-08-27 NOTE — ED Notes (Signed)
CRITICAL VALUE ALERT  Critical Value:  Troponin 0.05  Date & Time Notied:  08/27/17 1747  Provider Notified: Notified MD  Orders Received/Actions taken: Notified MD

## 2017-08-27 NOTE — H&P (Addendum)
History and Physical    Erica [name] ZOX:[id number] DOB: April 14, 1951 DOA: 08/27/2017  PCP: Iona Beard, MD Consultants:  None Patient coming from:  Home - lives with friend; NOK: Sister, [phone]  Chief Complaint: SOB  HPI: Erica [name] is a 66 y.o. female with medical history significant of HTN, DM, COPD on 2L home O2, chronic pain, and CHF (preserved EF with ungradeable diastolic dysfunction in 5/62) presenting with SOB.  She started feeling it this AM about 0400.  She couldn't sleep.  Unable to lie flat.  +cough, productive of white phlegm x 1 week.  She has been noticing some SOB before today.  No fevers.  No sick contacts.  No change in stools.  Yesterday normal BM.  No n/v.  No chest pain.  She has had SOB several times requiring hospitalization.  She does wear 2L home O2.  Last colonoscopy was about 5 years ago.   ED Course: Cough, wheezing, SOB this AM.  Continuous neb.  CXR negative.  Hgb 7.5, heme positive.  Usual 12-13.  Trop 0.05, no chest pain.  Type and screen done.  Desats into 80s with walking.  Review of Systems: As per HPI; otherwise review of systems reviewed and negative.   Ambulatory Status:  Ambulates with a cane  Past Medical History:  Diagnosis Date  . Arthritis   . CHF (congestive heart failure) ([place])   . Chronic back pain   . COPD (chronic obstructive pulmonary disease) (HCC)    2L home O2  . Diabetes mellitus without complication ([place])   . Hypertension     Past Surgical History:  Procedure Laterality Date  . ABDOMINAL HYSTERECTOMY      Social History   Socioeconomic History  . Marital status: Divorced    Spouse name: Not on file  . Number of children: Not on file  . Years of education: Not on file  . Highest education level: Not on file  Social Needs  . Financial resource strain: Not on file  . Food insecurity - worry: Not on file  . Food insecurity - inability: Not on file  . Transportation needs - medical: Not on file  .  Transportation needs - non-medical: Not on file  Occupational History  . Occupation: retired  Tobacco Use  . Smoking status: Current Every Day Smoker    Packs/day: 1.00    Years: 52.00    Pack years: 52.00    Types: Cigarettes  . Smokeless tobacco: Never Used  Substance and Sexual Activity  . Alcohol use: No  . Drug use: No  . Sexual activity: Not on file  Other Topics Concern  . Not on file  Social History Narrative   ** Merged History Encounter **        No Known Allergies  Family History  Problem Relation Age of Onset  . CVA Mother 15  . Cancer Neg Hx     Prior to Admission medications   Medication Sig Start Date End Date Taking? Authorizing Provider  aspirin 81 MG tablet Take 2 tablets by mouth daily.  10/22/15  Yes [provider]  atorvastatin (LIPITOR) 80 MG tablet Take 80 mg by mouth daily.  12/04/16  Yes [provider]  budesonide-formoterol (SYMBICORT) 160-4.5 MCG/ACT inhaler Inhale 2 puffs into the lungs 2 (two) times daily. 11/23/15  Yes Barton Dubois, MD  buPROPion [facility] SR) 150 MG 12 hr tablet Take 150 mg by mouth 2 (two) times daily. 12/04/16  Yes [provider]  clopidogrel (PLAVIX) 75 MG tablet Take 75 mg by mouth daily.   Yes [provider]  famotidine (PEPCID) 20 MG tablet Take 20 mg by mouth 2 (two) times daily.   Yes [provider]  ipratropium-albuterol (DUONEB) 0.5-2.5 (3) MG/3ML SOLN Take 3 mLs by nebulization every 6 (six) hours as needed (for shortness of breath).   Yes [provider]  Linaclotide (LINZESS) 145 MCG CAPS capsule Take 145 mcg by mouth daily.   Yes [provider]  metFORMIN (GLUCOPHAGE-XR) 500 MG 24 hr tablet Take 500 mg by mouth 2 (two) times daily. 12/04/16  Yes [provider]  naproxen sodium (ANAPROX) 220 MG tablet Take 440 mg by mouth daily as needed (pain).   Yes [provider]  omega-3 acid ethyl esters (LOVAZA) 1 g capsule Take 2 g by  mouth 2 (two) times daily.  12/07/16  Yes [provider]  omeprazole (PRILOSEC) 20 MG capsule Take 20 mg by mouth daily. 12/04/16  Yes [provider]  OXYGEN Inhale 2 L into the lungs continuous.   Yes [provider]  oxymorphone (OPANA ER) 5 MG 12 hr tablet Take 5 mg by mouth daily.   Yes [provider]  saxagliptin HCl (ONGLYZA) 5 MG TABS tablet Take 5 mg by mouth daily. Reported on 09/22/2015   Yes [provider]  valsartan-hydrochlorothiazide (DIOVAN-HCT) 160-12.5 MG per tablet Take 1 tablet by mouth daily.   Yes [provider]    Physical Exam: Vitals:   08/27/17 1730 08/27/17 1830 08/27/17 1948 08/27/17 2000  BP: (!) 141/64 (!) 137/50 (!) 167/71 (!) 146/58  Pulse:  (!) 109 (!) 105 (!) 114  Resp: (!) 26 (!) 24 (!) 22   Temp:      TempSrc:      SpO2:  100% 100% 99%  Weight:      Height:         General: Appears calm and comfortable and is NAD on 2L [place] O2 Eyes:  PERRL, EOMI, normal lids, iris ENT:  grossly normal hearing, lips & tongue, mmm Neck:  no LAD, masses or thyromegaly; no carotid bruits Cardiovascular:  RRR, no m/r/g. No LE edema.  Respiratory: Diffuse expiratory wheezing.  Mildly increased respiratory effort. Abdomen:  soft, RLQ TTP, ND, NABS Back:   normal alignment, no CVAT Skin:  Herpes zoster along right lower back, fully crusted over Musculoskeletal:  grossly normal tone BUE/BLE, good ROM, no bony abnormality Psychiatric:  grossly normal mood and affect, speech fluent and appropriate, AOx3 Neurologic:  CN 2-12 grossly intact, moves all extremities in coordinated fashion, sensation intact    Radiological Exams on Admission: Dg Chest 2 View  Result Date: 08/27/2017 CLINICAL DATA:  Progressive shortness of breath for 1 week. Chest pain. Productive cough. Chest congestion. COPD. EXAM: CHEST  2 VIEW COMPARISON:  02/11/2017 and 12/18/2016 FINDINGS: There is chronic interstitial and obstructive lung disease  with pleural based calcification in the right hemithorax. No acute infiltrates or effusions. Mild cardiomegaly. Pulmonary vascularity is normal. Aortic atherosclerosis. By no discrete effusions. No significant bone abnormality. IMPRESSION: By 1. Chronic interstitial and obstructive lung disease without acute abnormality. 2. Mild cardiomegaly. 3.  Aortic Atherosclerosis (ICD10-I70.0). Electronically Signed   By: Lorriane Shire M.D.   On: 08/27/2017 13:32    EKG: Independently reviewed.  Sinus tachycardia with rate 111; nonspecific ST changes with no evidence of acute ischemia, NSCSLT   Labs on Admission: I have personally reviewed the available labs and imaging studies  at the time of the admission.  Pertinent labs:   Glucose 112 BUN 25/Creatinine 1.62/GFR 37 - stable Troponin 0.05 Hgb 7.5; 12.1 in 5/18   Assessment/Plan Principal Problem:   Acute on chronic respiratory failure (HCC) Active Problems:   Chronic pain syndrome   Hypertension   Diabetes mellitus type 2, controlled (HCC)   COPD exacerbation (HCC)   Benign essential HTN   Tobacco abuse   GI bleed   Elevated troponin   (HFpEF) heart failure with preserved ejection fraction (HCC)   Acute on chronic respiratory failure associated with a COPD exacerbation -Patient's shortness of breath and productive cough are most likely caused by acute COPD exacerbation.  -She has history of O2-dependent COPD -She does not have fever or leukocytosis. Chest x-ray is not consistent with pneumonia -She was given a continuous neb treatment in the ED with some improvement. -will admit patient  -Nebulizers: scheduled Duoneb and prn albuterol -Solu-Medrol 60 mg IV BID (d/c when appropriate due to GI bleed) -Azithromycin  GI bleed -Uncertain source -Patient reports that she has been asymptomatic and has not noticed a change in her stools -Symptomatic anemia may be contributing to her current SOB, but she is improved with the continuous neb  and yet her anemia has not been treated so this makes this seem less likely -Recheck CBC in AM -Will hold transfusion until Hgb <7 -She has been taking NSAID so will hold and add BID IV PPI -GI consult in AM -Hold ASA/Plavix for now -Clear liquids, NPO after MN just in case -Will order CT A/P with contrast since patient does endorse RLQ TTP  Elevated troponin -In the setting of no CP and stable EKG, this is likely demand ischemia -Will trend troponins -If uptrending, will need telemetry and repeat EKG  DM -A1c 5.7 in 5/18 -Hold Onglyza and Glucophage -Cover with moderate-scale SSI  HTN -Hold Diovan-HCT -Cover with prn IV hydralazine  HFpEF -Appears to be compensated at this time  Tobacco dependence -Encourage cessation.  This was discussed with the patient and should be reviewed on an ongoing basis.   -Patch ordered at patient request.  Chronic pain -Hold Opana (our formulary substitute is MS Contin and the Opana dose is too low); additionally in review of [place] she is not consistently prescribed this medication -Cover with IV morphine prn  DVT prophylaxis: SCDs Code Status: Full - confirmed with patient/family Family Communication: Boyfriend and sister present throughout evaluation Disposition Plan:  Home once clinically improved Consults called: GI; CM/Nutrition/RT/SW/PT/OT Admission status: Admit - It is my clinical opinion that admission to INPATIENT is reasonable and necessary because this patient will require at least 2 midnights in the hospital to treat this condition based on the medical complexity of the problems presented.  Given the aforementioned information, the predictability of an adverse outcome is felt to be significant.    Karmen Bongo MD [facility]  If note is complete, please contact covering daytime or nighttime physician. [email or web address] Password [facility]  08/27/2017, 8:37 PM

## 2017-08-27 NOTE — ED Notes (Signed)
Ambulated pt with pulse ox start level at 89 full circle around nurse station level was at 81 when we returned to room.

## 2017-08-27 NOTE — Care Management Note (Signed)
Case Management Note  Patient Details  Name: Erica [name] MRN: [id number] Date of Birth: 09-11-1951  Subjective/Objective:          Sob - states oxygen equipment gets too hot.  [name] was brought to the ER by her sister Enid Derry.  She has a big oxygen concentrator at home that works and she can use at home.  It is her portable oxygen concentrator that does not work.  She also has portable tanks at home. She gets through [phone].  Does not have portable oxygen tank with her.   Sister in agreement to go home and get oxygen tank and bring back.  Talking with patient now on exactly where it is.  [employer] will come by tomorrow and check her portable oxygen concentrator.    Patient getting an hour long breathing treatment and then will need to make sure she can ambulate prior to dc.    Action/Plan:   Expected Discharge Date:                  Expected Discharge Plan:     In-House Referral:     Discharge planning Services     Post Acute Care Choice:    Choice offered to:     DME Arranged:    DME Agency:     HH Arranged:    HH Agency:     Status of Service:     If discussed at [employer] of Stay Meetings, dates discussed:    Additional Comments:  Vergie Living, RN 08/27/2017, 4:12 PM

## 2017-08-27 NOTE — Progress Notes (Signed)
Addendum: CT report received and appears to show mild acute diverticulitis.  Will change antibiotics from azithro to Cipro/Flagyl, as the Cipro should also provide COPD coverage.  Renal atrophy and developing pulmonary fibrosis deferred to day team for f/u in AM.  Carlyon Shadow, M.D.

## 2017-08-27 NOTE — ED Provider Notes (Signed)
[facility] EMERGENCY DEPARTMENT Provider Note   CSN: [id number] Arrival date & time: 08/27/17  1237     History   Chief Complaint Chief Complaint  Patient presents with  . Shortness of Breath    HPI Erica [name] is a 66 y.o. female.  HPI  Pt was seen at 1540.  Per pt, c/o gradual onset and worsening of persistent cough, wheezing and SOB since this morning.  States her O2 machine at home was overheating and stopped working. Has been using home O2, MDI and nebs without relief.  Denies CP/palpitations, no back pain, no abd pain, no N/V/D, no fevers, no rash.    Past Medical History:  Diagnosis Date  . CHF (congestive heart failure) ([place])   . Chronic back pain   . COPD (chronic obstructive pulmonary disease) (HCC)    2L home O2  . Diabetes mellitus without complication ([place])   . Hypertension   . Oxygen deficiency     Patient Active Problem List   Diagnosis Date Noted  . Acute dyspnea   . Acute on chronic respiratory failure ([place]) 02/11/2017  . Acute on chronic diastolic CHF (congestive heart failure) ([place]) 02/11/2017  . Acute respiratory failure with hypoxia ([place]) 11/20/2015  . COPD exacerbation ([place]) 11/20/2015  . Benign essential HTN 11/20/2015  . GERD (gastroesophageal reflux disease) 11/20/2015  . Tobacco abuse 11/20/2015  . Type 2 diabetes mellitus with hyperglycemia ([place]) 11/20/2015  . COPD exacerbation ([place]) 09/05/2015  . Chronic pain syndrome 09/05/2015  . Hypertension 09/05/2015  . Diabetes mellitus type 2, controlled ([place]) 09/05/2015  . Chronic respiratory failure ([place]) 09/05/2015  . OLECRANON BURSITIS, LEFT 08/04/2010    Past Surgical History:  Procedure Laterality Date  . ABDOMINAL HYSTERECTOMY      OB History    Gravida Para Term Preterm AB Living   0 0 0 0 0     SAB TAB Ectopic Multiple Live Births   0 0 0           Home Medications    Prior to Admission medications   Medication Sig Start Date End Date Taking? Authorizing Provider    aspirin 81 MG tablet Take 2 tablets by mouth daily.  10/22/15  Yes [provider]  atorvastatin (LIPITOR) 80 MG tablet Take 80 mg by mouth daily.  12/04/16  Yes [provider]  budesonide-formoterol (SYMBICORT) 160-4.5 MCG/ACT inhaler Inhale 2 puffs into the lungs 2 (two) times daily. 11/23/15  Yes Barton Dubois, MD  buPROPion [facility] SR) 150 MG 12 hr tablet Take 150 mg by mouth 2 (two) times daily. 12/04/16  Yes [provider]  clopidogrel (PLAVIX) 75 MG tablet Take 75 mg by mouth daily.   Yes [provider]  famotidine (PEPCID) 20 MG tablet Take 20 mg by mouth 2 (two) times daily.   Yes [provider]  ipratropium-albuterol (DUONEB) 0.5-2.5 (3) MG/3ML SOLN Take 3 mLs by nebulization every 6 (six) hours as needed (for shortness of breath).   Yes [provider]  Linaclotide (LINZESS) 145 MCG CAPS capsule Take 145 mcg by mouth daily.   Yes [provider]  metFORMIN (GLUCOPHAGE-XR) 500 MG 24 hr tablet Take 500 mg by mouth 2 (two) times daily. 12/04/16  Yes [provider]  naproxen sodium (ANAPROX) 220 MG tablet Take 440 mg by mouth daily as needed (pain).   Yes [provider]  omega-3 acid ethyl esters (LOVAZA) 1 g capsule Take 2 g by mouth 2 (two) times daily.  12/07/16  Yes [provider]  omeprazole (PRILOSEC) 20 MG capsule Take 20 mg by mouth daily. 12/04/16  Yes [provider]  OXYGEN Inhale 2 L into the lungs continuous.   Yes [provider]  oxymorphone (OPANA ER) 5 MG 12 hr tablet Take 5 mg by mouth daily.   Yes [provider]  saxagliptin HCl (ONGLYZA) 5 MG TABS tablet Take 5 mg by mouth daily. Reported on 09/22/2015   Yes [provider]  valsartan-hydrochlorothiazide (DIOVAN-HCT) 160-12.5 MG per tablet Take 1 tablet by mouth daily.   Yes [provider]    Family History Family History  Problem Relation Age of Onset  . CVA Mother 7     Social History Social History   Tobacco Use  . Smoking status: Current Every Day Smoker    Packs/day: 1.00    Years: 51.00    Pack years: 51.00    Types: Cigarettes  . Smokeless tobacco: Never Used  Substance Use Topics  . Alcohol use: No  . Drug use: No     Allergies   Patient has no known allergies.   Review of Systems Review of Systems ROS: Statement: All systems negative except as marked or noted in the HPI; Constitutional: Negative for fever and chills. ; ; Eyes: Negative for eye pain, redness and discharge. ; ; ENMT: Negative for ear pain, hoarseness, nasal congestion, sinus pressure and sore throat. ; ; Cardiovascular: Negative for chest pain, palpitations, diaphoresis, and peripheral edema. ; ; Respiratory: +SOB, cough, wheezing. Negative for stridor. ; ; Gastrointestinal: Negative for nausea, vomiting, diarrhea, abdominal pain, blood in stool, hematemesis, jaundice and rectal bleeding. . ; ; Genitourinary: Negative for dysuria, flank pain and hematuria. ; ; Musculoskeletal: Negative for back pain and neck pain. Negative for swelling and trauma.; ; Skin: Negative for pruritus, rash, abrasions, blisters, bruising and skin lesion.; ; Neuro: Negative for headache, lightheadedness and neck stiffness. Negative for weakness, altered level of consciousness, altered mental status, extremity weakness, paresthesias, involuntary movement, seizure and syncope.       Physical Exam Updated Vital Signs BP (!) 167/69   Pulse (!) 110   Temp 98.4 F (36.9 C) (Oral)   Resp (!) 24   Ht 5\' 4"  (1.626 m)   Wt 79.8 kg (176 lb)   SpO2 100%   BMI 30.21 kg/m    Patient Vitals for the past 24 hrs:  BP Temp Temp src Pulse Resp SpO2 Height Weight  08/27/17 1830 (!) 167/69 - - (!) 110 (!) 24 100 % - -  08/27/17 1730 (!) 141/64 - - - (!) 26 - - -  08/27/17 1700 (!) 136/55 - - 81 (!) 22 100 % - -  08/27/17 1630 (!) 130/108 - - - (!) 30 - - -  08/27/17 1615 - - - 99 20 100 % - -  08/27/17  1606 (!) 141/54 - - (!) 104 (!) 26 98 % - -  08/27/17 1600 (!) 141/54 - - (!) 107 (!) 27 98 % - -  08/27/17 1255 - - - - - - 5\' 4"  (1.626 m) 79.8 kg (176 lb)  08/27/17 1253 (!) 140/46 98.4 F (36.9 C) Oral (!) 114 16 100 % - -      Physical Exam 1545: Physical examination:  Nursing notes reviewed; Vital signs and O2 SAT reviewed;  Constitutional: Well developed, Well nourished, Well hydrated, Uncomfortable appearing.;; Head:  Normocephalic, atraumatic; Eyes: EOMI, PERRL, No scleral icterus; ENMT: Mouth and pharynx  normal, Mucous membranes moist; Neck: Supple, Full range of motion, No lymphadenopathy; Cardiovascular: Tachycardic rate and rhythm, No gallop; Respiratory: Breath sounds coarse & equal bilaterally, insp/exp wheezes bilat. +audible wheezing.  Speaking short sentences, sitting upright, tachypneic.; Chest: Nontender, Movement normal; Abdomen: Soft, Nontender, Nondistended, Normal bowel sounds; Genitourinary: No CVA tenderness; Extremities: Pulses normal, No tenderness, No edema, No calf edema or asymmetry.; Neuro: AA&Ox3, Major CN grossly intact.  Speech clear. No gross focal motor or sensory deficits in extremities.; Skin: Color normal, Warm, Dry.   ED Treatments / Results  Labs (all labs ordered are listed, but only abnormal results are displayed)   EKG  EKG Interpretation  Date/Time:  Monday August 27 2017 12:54:36 EST Ventricular Rate:  111 PR Interval:  148 QRS Duration: 92 QT Interval:  352 QTC Calculation: 478 R Axis:   66 Text Interpretation:  Sinus tachycardia Otherwise normal ECG When compared with ECG of 02/12/2017 No significant change was found Confirmed by Francine Graven [phone]) on 08/27/2017 3:55:40 PM       Radiology   Procedures Procedures (including critical care time)  Medications Ordered in ED Medications  albuterol (PROVENTIL) (2.5 MG/3ML) 0.083% nebulizer solution 5 mg (5 mg Nebulization Not Given 08/27/17 1501)  methylPREDNISolone sodium  succinate (SOLU-MEDROL) 125 mg/2 mL injection 125 mg (125 mg Intravenous Given 08/27/17 1643)  albuterol (PROVENTIL,VENTOLIN) solution continuous neb (10 mg/hr Nebulization Given 08/27/17 1600)  ipratropium (ATROVENT) nebulizer solution 1 mg (1 mg Nebulization Given 08/27/17 1600)     Initial Impression / Assessment and Plan / ED Course  I have reviewed the triage vital signs and the nursing notes.  Pertinent labs & imaging results that were available during my care of the patient were reviewed by me and considered in my medical decision making (see chart for details).  MDM Reviewed: previous chart, nursing note and vitals Reviewed previous: labs and ECG Interpretation: labs, ECG and x-ray Total time providing critical care: 30-74 minutes. This excludes time spent performing separately reportable procedures and services. Consults: admitting MD   CRITICAL CARE Performed by: Alfonzo Feller Total critical care time: 35 minutes Critical care time was exclusive of separately billable procedures and treating other patients. Critical care was necessary to treat or prevent imminent or life-threatening deterioration. Critical care was time spent personally by me on the following activities: development of treatment plan with patient and/or surrogate as well as nursing, discussions with consultants, evaluation of patient's response to treatment, examination of patient, obtaining history from patient or surrogate, ordering and performing treatments and interventions, ordering and review of laboratory studies, ordering and review of radiographic studies, pulse oximetry and re-evaluation of patient's condition.   Results for orders placed or performed during the hospital encounter of 79/02/40  Basic metabolic panel  Result Value Ref Range   Sodium 140 135 - 145 mmol/L   Potassium 4.1 3.5 - 5.1 mmol/L   Chloride 104 101 - 111 mmol/L   CO2 28 22 - 32 mmol/L   Glucose, Bld 112 (H) 65 - 99 mg/dL    BUN 25 (H) 6 - 20 mg/dL   Creatinine, Ser 1.62 (H) 0.44 - 1.00 mg/dL   Calcium 8.8 (L) 8.9 - 10.3 mg/dL   GFR calc non Af Amer 32 (L) >60 mL/min   GFR calc Af Amer 37 (L) >60 mL/min   Anion gap 8 5 - 15  Brain natriuretic peptide  Result Value Ref Range   B Natriuretic Peptide 100.0 0.0 - 100.0 pg/mL  Troponin I  Result Value Ref Range   Troponin I 0.05 (HH) <0.03 ng/mL  CBC with Differential  Result Value Ref Range   WBC 8.4 4.0 - 10.5 K/uL   RBC 3.30 (L) 3.87 - 5.11 MIL/uL   Hemoglobin 7.5 (L) 12.0 - 15.0 g/dL   HCT 25.3 (L) 36.0 - 46.0 %   MCV 76.7 (L) 78.0 - 100.0 fL   MCH 22.7 (L) 26.0 - 34.0 pg   MCHC 29.6 (L) 30.0 - 36.0 g/dL   RDW 17.2 (H) 11.5 - 15.5 %   Platelets 277 150 - 400 K/uL   Neutrophils Relative % 64 %   Neutro Abs 5.4 1.7 - 7.7 K/uL   Lymphocytes Relative 25 %   Lymphs Abs 2.1 0.7 - 4.0 K/uL   Monocytes Relative 7 %   Monocytes Absolute 0.6 0.1 - 1.0 K/uL   Eosinophils Relative 4 %   Eosinophils Absolute 0.3 0.0 - 0.7 K/uL   Basophils Relative 0 %   Basophils Absolute 0.0 0.0 - 0.1 K/uL  Type and screen  Result Value Ref Range   ABO/RH(D) PENDING    Antibody Screen PENDING    Sample Expiration 08/30/2017    Dg Chest 2 View Result Date: 08/27/2017 CLINICAL DATA:  Progressive shortness of breath for 1 week. Chest pain. Productive cough. Chest congestion. COPD. EXAM: CHEST  2 VIEW COMPARISON:  02/11/2017 and 12/18/2016 FINDINGS: There is chronic interstitial and obstructive lung disease with pleural based calcification in the right hemithorax. No acute infiltrates or effusions. Mild cardiomegaly. Pulmonary vascularity is normal. Aortic atherosclerosis. By no discrete effusions. No significant bone abnormality. IMPRESSION: By 1. Chronic interstitial and obstructive lung disease without acute abnormality. 2. Mild cardiomegaly. 3.  Aortic Atherosclerosis (ICD10-I70.0). Electronically Signed   By: Lorriane Shire M.D.   On: 08/27/2017 13:32   Results for MCKINZIE, SAKSA (MRN [id number]) as of 08/27/2017 18:49  Ref. Range 01/04/2016 13:45 12/18/2016 16:15 02/11/2017 13:03 02/12/2017 04:29 08/27/2017 16:28  Hemoglobin Latest Ref Range: 12.0 - 15.0 g/dL 16.3 (H) 13.3 12.1 12.1 7.5 (L)  HCT Latest Ref Range: 36.0 - 46.0 % 48.0 (H) 39.8 36.6 37.5 25.3 (L)     1905:  On arrival: pt sitting upright, tachypneic, tachycardic, Sats 98 % O2 2L N/C, lungs coarse with wheezing. IV solumedrol and hour long neb started. After neb: pt appears more comfortable at rest, less tachypneic, Sats 98 % on O2 2L N/C, lungs continue coarse with scattered wheezing. Pt ambulated in hallway: pt's O2 Sats dropped to 81 % with pt c/o increasing SOB, with increasing HR and RR. Pt escorted back to stretcher with O2 Sats slowly increasing to 98 % on O2 2L N/C.  H/H lower than previous:  Rectal exam performed w/permission of pt and ED RN chaperone present.  Anal tone normal.  Non-tender, soft brown stool in rectal vault, heme positive.  No fissures, no external hemorrhoids, no palp masses. Type and screen ordered. Dx and testing d/w pt and family.  Questions answered.  Verb understanding, agreeable to admit. T/C to [facility]. Lorin Mercy, case discussed, including:  HPI, pertinent PM/SHx, VS/PE, dx testing, ED course and treatment:  Agreeable to admit.        Final Clinical Impressions(s) / ED Diagnoses   Final diagnoses:  None    ED Discharge Orders    None       Francine Graven, DO 08/31/17 0719

## 2017-08-27 NOTE — ED Triage Notes (Signed)
Pt reports shortness of breath since this am. Pt reports machine at home was overheating and stopped working approximately 30 minutes ago. Pt is on 2 liters at home. Pt denies pain but reports wheezing with cough at home. Denies any known fevers.

## 2017-08-28 ENCOUNTER — Encounter (HOSPITAL_COMMUNITY): Payer: Self-pay | Admitting: Gastroenterology

## 2017-08-28 DIAGNOSIS — D649 Anemia, unspecified: Secondary | ICD-10-CM

## 2017-08-28 DIAGNOSIS — R195 Other fecal abnormalities: Secondary | ICD-10-CM

## 2017-08-28 LAB — TROPONIN I
TROPONIN I: 0.05 ng/mL — AB (ref <0.03)
TROPONIN I: 0.04 ng/mL — AB (ref <0.03)

## 2017-08-28 LAB — GLUCOSE, CAPILLARY
GLUCOSE-CAPILLARY: 173 mg/dL — AB (ref 65–99)
GLUCOSE-CAPILLARY: 183 mg/dL — AB (ref 65–99)
Glucose-Capillary: 124 mg/dL — ABNORMAL HIGH (ref 65–99)
Glucose-Capillary: 118 mg/dL — ABNORMAL HIGH (ref 65–99)

## 2017-08-28 LAB — BASIC METABOLIC PANEL
Anion gap: 6 (ref 5–15)
BUN: 22 mg/dL — AB (ref 6–20)
CALCIUM: 8.9 mg/dL (ref 8.9–10.3)
CO2: 28 mmol/L (ref 22–32)
Chloride: 104 mmol/L (ref 101–111)
Creatinine, Ser: 1.33 mg/dL — ABNORMAL HIGH (ref 0.44–1.00)
GFR calc Af Amer: 46 mL/min — ABNORMAL LOW (ref >60)
GFR, EST NON AFRICAN AMERICAN: 40 mL/min — AB (ref >60)
GLUCOSE: 159 mg/dL — AB (ref 65–99)
POTASSIUM: 4.6 mmol/L (ref 3.5–5.1)
SODIUM: 138 mmol/L (ref 135–145)

## 2017-08-28 LAB — CBC
HEMATOCRIT: 23.9 % — AB (ref 36.0–46.0)
Hemoglobin: 7.1 g/dL — ABNORMAL LOW (ref 12.0–15.0)
MCH: 22.5 pg — ABNORMAL LOW (ref 26.0–34.0)
MCHC: 29.7 g/dL — AB (ref 30.0–36.0)
MCV: 75.9 fL — ABNORMAL LOW (ref 78.0–100.0)
PLATELETS: 297 10*3/uL (ref 150–400)
RBC: 3.15 MIL/uL — ABNORMAL LOW (ref 3.87–5.11)
RDW: 16.7 % — AB (ref 11.5–15.5)
WBC: 6.1 10*3/uL (ref 4.0–10.5)

## 2017-08-28 MED ORDER — AMLODIPINE BESYLATE 5 MG PO TABS
5.0000 mg | ORAL_TABLET | Freq: Every day | ORAL | Status: DC
  Administered 2017-08-28 – 2017-08-30 (×3): 5 mg via ORAL
  Filled 2017-08-28 (×3): qty 1

## 2017-08-28 MED ORDER — ALUM & MAG HYDROXIDE-SIMETH 200-200-20 MG/5ML PO SUSP
30.0000 mL | Freq: Once | ORAL | Status: AC
  Administered 2017-08-28: 30 mL via ORAL
  Filled 2017-08-28: qty 30

## 2017-08-28 MED ORDER — IPRATROPIUM-ALBUTEROL 0.5-2.5 (3) MG/3ML IN SOLN
3.0000 mL | Freq: Three times a day (TID) | RESPIRATORY_TRACT | Status: DC
  Administered 2017-08-28 – 2017-08-30 (×7): 3 mL via RESPIRATORY_TRACT
  Filled 2017-08-28 (×7): qty 3

## 2017-08-28 NOTE — Progress Notes (Signed)
PROGRESS NOTE    Erica [name]  OEU:[id number] DOB: 31-Jan-1951 DOA: 08/27/2017 PCP: Iona Beard, MD     Brief Narrative:  66 year old woman admitted from home on 12/3 due to shortness of breath.  She has a history significant for hypertension, diabetes, COPD on chronic oxygen, diastolic heart failure.  She denied fevers.  She is also been having some right lower quadrant pain.  She was diagnosed with COPD exacerbation as well as acute diverticulitis and admission was requested.   Assessment & Plan:   Principal Problem:   Acute on chronic respiratory failure (HCC) Active Problems:   Chronic pain syndrome   Hypertension   Diabetes mellitus type 2, controlled (HCC)   COPD exacerbation (HCC)   Benign essential HTN   Tobacco abuse   GI bleed   Elevated troponin   (HFpEF) heart failure with preserved ejection fraction (HCC)   Heme positive stool   Symptomatic anemia   Acute on chronic respiratory failure -On account of COPD with acute exacerbation. -Seems improved today, only minimal wheezing on exam. -Continue steroids, frequent nebs. -No signs of pneumonia on chest x-ray.  Microcytic anemia -With positive FOBT. -Suspect likely due to chronic blood loss anemia.  She is known to use NSAIDs frequently at home. -Advised to quit NSAID use, has been placed on Protonix.  GI has been consulted. -Hemoglobin this a.m. is 7.1, hold transfusion for hemoglobin less than 7. -Believe she probably should have an EGD and possibly colonoscopy as an outpatient. -Chronic kidney disease is also probably playing a role in her microcytic anemia.  Chronic kidney disease stage III -Baseline creatinine between 1.4 and 1.5. -Creatinine is better than/at baseline today. -Is chronically on ARB/diuretic combination pill, this probably should be discontinued long-term given her chronic kidney disease, also Diovan has been recalled.  Elevated troponin -Flat, highest of 0.06.  No chest pain. -EKG  without acute ischemic changes. -Echo in May 2018 with preserved ejection fraction, do not believe further cardiac workup is indicated at this time.  Chronic diastolic heart failure -Echo in May 2018 with ejection fraction of 60-65% and grade 1 diastolic dysfunction. -Is compensated at present.  Acute diverticulitis -Pain is improved, continue Cipro and Flagyl for now.  Diabetes -Fair control, continue to follow and adjust regimen as needed.  Hypertension -BP remains elevated, since we have taken her off her ARB/diuretic combo, will go ahead and start Norvasc 5 mg daily.   DVT prophylaxis: SCDs Code Status: Full code Family Communication: Patient only Disposition Plan: Home once medically stable  Consultants:   GI  Procedures:   None  Antimicrobials:  Anti-infectives (From admission, onward)   Start     Dose/Rate Route Frequency Ordered Stop   08/27/17 2300  metroNIDAZOLE (FLAGYL) IVPB 500 mg     500 mg 100 mL/hr over 60 Minutes Intravenous Every 8 hours 08/27/17 2110     08/27/17 2200  ciprofloxacin (CIPRO) IVPB 400 mg     400 mg 200 mL/hr over 60 Minutes Intravenous Every 12 hours 08/27/17 2110         Subjective: Feels improved, less short of breath, abdominal pain is resolving.  Objective: Vitals:   08/28/17 0207 08/28/17 0555 08/28/17 0801 08/28/17 1433  BP:  (!) 151/64    Pulse:  (!) 103    Resp:  15    Temp:  97.8 F (36.6 C)    TempSrc:  Oral    SpO2: 98% 93% 94% 99%  Weight:  Height:       No intake or output data in the 24 hours ending 08/28/17 1514 Filed Weights   08/27/17 1255 08/27/17 2134  Weight: 79.8 kg (176 lb) 79.7 kg (175 lb 11.2 oz)    Examination:  General exam: Alert, awake, oriented x 3 Respiratory system: Minimal expiratory wheezing Cardiovascular system:RRR. No murmurs, rubs, gallops. Gastrointestinal system: Abdomen is nondistended, soft and slightly tender to palpation of the left lower quadrant. No organomegaly or  masses felt. Normal bowel sounds heard. Central nervous system: Alert and oriented. No focal neurological deficits. Extremities: No C/C/E, +pedal pulses Skin: No rashes, lesions or ulcers Psychiatry: Judgement and insight appear normal. Mood & affect appropriate.     Data Reviewed: I have personally reviewed following labs and imaging studies  CBC: Recent Labs  Lab 08/27/17 1628 08/28/17 0436  WBC 8.4 6.1  NEUTROABS 5.4  --   HGB 7.5* 7.1*  HCT 25.3* 23.9*  MCV 76.7* 75.9*  PLT 277 269   Basic Metabolic Panel: Recent Labs  Lab 08/27/17 1628 08/28/17 0436  NA 140 138  K 4.1 4.6  CL 104 104  CO2 28 28  GLUCOSE 112* 159*  BUN 25* 22*  CREATININE 1.62* 1.33*  CALCIUM 8.8* 8.9   GFR: Estimated Creatinine Clearance: 41.3 mL/min (A) (by C-G formula based on SCr of 1.33 mg/dL (H)). Liver Function Tests: No results for input(s): AST, ALT, ALKPHOS, BILITOT, PROT, ALBUMIN in the last 168 hours. No results for input(s): LIPASE, AMYLASE in the last 168 hours. No results for input(s): AMMONIA in the last 168 hours. Coagulation Profile: No results for input(s): INR, PROTIME in the last 168 hours. Cardiac Enzymes: Recent Labs  Lab 08/27/17 1628 08/27/17 2249 08/28/17 0436 08/28/17 0958  TROPONINI 0.05* 0.06* 0.05* 0.04*   BNP (last 3 results) No results for input(s): PROBNP in the last 8760 hours. HbA1C: No results for input(s): HGBA1C in the last 72 hours. CBG: Recent Labs  Lab 08/28/17 0812 08/28/17 1107  GLUCAP 173* 183*   Lipid Profile: No results for input(s): CHOL, HDL, LDLCALC, TRIG, CHOLHDL, LDLDIRECT in the last 72 hours. Thyroid Function Tests: No results for input(s): TSH, T4TOTAL, FREET4, T3FREE, THYROIDAB in the last 72 hours. Anemia Panel: No results for input(s): VITAMINB12, FOLATE, FERRITIN, TIBC, IRON, RETICCTPCT in the last 72 hours. Urine analysis:    Component Value Date/Time   COLORURINE YELLOW 03/14/2013 1847   APPEARANCEUR CLEAR  03/14/2013 1847   LABSPEC 1.025 03/14/2013 1847   PHURINE 6.0 03/14/2013 1847   GLUCOSEU NEGATIVE 03/14/2013 1847   HGBUR NEGATIVE 03/14/2013 1847   BILIRUBINUR NEGATIVE 03/14/2013 1847   KETONESUR NEGATIVE 03/14/2013 1847   PROTEINUR NEGATIVE 03/14/2013 1847   UROBILINOGEN 0.2 03/14/2013 1847   NITRITE NEGATIVE 03/14/2013 1847   LEUKOCYTESUR NEGATIVE 03/14/2013 1847   Sepsis Labs: @LABRCNTIP (procalcitonin:4,lacticidven:4)  )No results found for this or any previous visit (from the past 240 hour(s)).       Radiology Studies: Dg Chest 2 View  Result Date: 08/27/2017 CLINICAL DATA:  Progressive shortness of breath for 1 week. Chest pain. Productive cough. Chest congestion. COPD. EXAM: CHEST  2 VIEW COMPARISON:  02/11/2017 and 12/18/2016 FINDINGS: There is chronic interstitial and obstructive lung disease with pleural based calcification in the right hemithorax. No acute infiltrates or effusions. Mild cardiomegaly. Pulmonary vascularity is normal. Aortic atherosclerosis. By no discrete effusions. No significant bone abnormality. IMPRESSION: By 1. Chronic interstitial and obstructive lung disease without acute abnormality. 2. Mild cardiomegaly. 3.  Aortic Atherosclerosis (  ICD10-I70.0). Electronically Signed   By: Lorriane Shire M.D.   On: 08/27/2017 13:32   Ct Abdomen Pelvis W Contrast  Result Date: 08/27/2017 CLINICAL DATA:  66 y/o female with shortness of Breath. Blood in stool. EXAM: CT ABDOMEN AND PELVIS WITH CONTRAST TECHNIQUE: Multidetector CT imaging of the abdomen and pelvis was performed using the standard protocol following bolus administration of intravenous contrast. CONTRAST:  17mL ISOVUE-300 IOPAMIDOL (ISOVUE-300) INJECTION 61% COMPARISON:  CT Abdomen and Pelvis 09/18/2007. FINDINGS: Lower chest: Chronic cardiomegaly.  No pericardial effusion. Chronic bilateral lower lobe pleural thickening and small calcified pleural plaques. Superimposed chronic bilateral lung base  architectural distortion which has progressed since 2008. Areas of honeycombing in the right lower lobe. No right pleural effusion or definite acute pulmonary opacity. Hepatobiliary: The gallbladder is distended but there is no pericholecystic inflammation. No biliary ductal dilatation. Normal liver enhancement. Pancreas: Negative. Spleen: Negative. Adrenals/Urinary Tract: Normal adrenal glands. The right kidney has severely atrophied since 2008. The left kidney appears stable to mildly hypertrophied since the prior CT. There is normal appearing left renal contrast excretion on the delayed images. No right renal contrast excretion. Negative course of the left ureter. Unremarkable urinary bladder. Stomach/Bowel: Negative rectum. Mild to moderate diverticulosis in the sigmoid colon and descending colon. Best seen on coronal image 51 there is a small area of acute mesenteric stranding at the level of the proximal sigmoid colon. This is more subtle on the axial images. No other active inflammation identified in the region. Negative splenic flexure, transverse colon and right colon aside from retained stool. Appendix is diminutive or absent. Flocculated material in the mid and distal small bowel which otherwise appears normal. Oral contrast has not yet reached the distal small bowel. No dilated small bowel. Chronic juxta phrenic gastric diverticulum is stable (series 2, image 16). Otherwise negative stomach. Negative duodenum ; small chronic duodenum lipoma is unchanged (series 2, image 33). No abdominal free fluid or free air. Vascular/Lymphatic: Extensive Aortoiliac calcified atherosclerosis. Major arterial structures in the abdomen and pelvis are patent. Portal venous system is patent. No lymphadenopathy. Reproductive: Surgically absent. Other: No pelvic free fluid. Postoperative changes to the ventral low or abdominal wall appears stable. Musculoskeletal: No acute osseous abnormality identified. IMPRESSION: 1.  Diverticulosis in the distal colon with mild acute inflammation at the proximal sigmoid compatible with mild acute diverticulitis. No abscess or complicating features. 2. No other acute findings in the abdomen or pelvis. 3. Severe right renal atrophy since 2008. 4. Extensive Aortoiliac calcified atherosclerosis. 5. Chronic lung disease with lower lobe pleural plaques and developing pulmonary fibrosis. Electronically Signed   By: Genevie Ann M.D.   On: 08/27/2017 20:51        Scheduled Meds: . atorvastatin  80 mg Oral q1800  . buPROPion  150 mg Oral BID  . famotidine  20 mg Oral BID  . insulin aspart  0-15 Units Subcutaneous TID WC  . ipratropium-albuterol  3 mL Nebulization TID  . methylPREDNISolone (SOLU-MEDROL) injection  60 mg Intravenous Q12H  . nicotine  14 mg Transdermal Daily  . pantoprazole (PROTONIX) IV  40 mg Intravenous Q12H   Continuous Infusions: . ciprofloxacin 400 mg (08/28/17 1024)  . lactated ringers 100 mL/hr at 08/28/17 1258  . metronidazole 500 mg (08/28/17 0600)     LOS: 1 day    Time spent: 30 minutes. Greater than 50% of this time was spent in direct contact with the patient coordinating care.     Lelon Frohlich, MD  [facility] Pager [phone]  If 7PM-7AM, please contact night-coverage [email or web address] Password [email or web address] 08/28/2017, 3:14 PM

## 2017-08-28 NOTE — Progress Notes (Signed)
Nutrition Brief Note  Patient screened as part of the COPD Gold protocol.   Wt Readings from Last 15 Encounters:  08/27/17 175 lb 11.2 oz (79.7 kg)  02/13/17 173 lb 14.4 oz (78.9 kg)  12/18/16 180 lb (81.6 kg)  01/04/16 167 lb (75.8 kg)  11/23/15 158 lb 1.1 oz (71.7 kg)  09/06/15 159 lb 11.2 oz (72.4 kg)  11/12/13 182 lb (82.6 kg)  03/14/13 189 lb (85.7 kg)   Body mass index is 30.16 kg/m. Patient meets criteria for Obese based on current BMI.   Patient reports, that PTA, her PO intake was good. She denies her COPD having any negative impact on her PO intake. Denies weight changes. Per chart, her weight has been trending up for the last 2 years.   Denies any n/v/c/d or other factors limiting PO intake. She has a strong appetite and is anxiously awaiting for her diet to be advanced.   No nutrition interventions warranted at this time. If nutrition issues arise, please consult RD.   Burtis Junes RD, LDN, CNSC Clinical Nutrition Pager: [phone] 08/28/2017 1:15 PM

## 2017-08-28 NOTE — Consult Note (Signed)
Referring Provider: Dr. Lorin Mercy  Primary Care Physician:  Iona Beard, MD Primary Gastroenterologist:  Dr. Gala Romney   Date of Admission: 08/27/17 Date of Consultation: 08/28/17  Reason for Consultation:  Heme positive stool  HPI:  Erica [name] is a 66 y.o. year old female who presented with worsening shortness of breath, dyspnea on exertion, feeling as if something is pushing her down in upper abdomen right below sternum. No N/V. No fatigue. No overt GI bleeding. Found to be heme positive in ED. Noted to have new onset microcytic anemia with admitting Hgb 7.5.  Hgb in May of this year normal at 12.1. CT abd/pelvis with contrast this admission due to abdominal pain showed small area of acute stranding at proximal sigmoid compatible with mild acute diverticultiis.  She notes she has been taking a lot of Ibuprofen recently due to back pain, and she also reports a "pain reliever 5 mg" over the counter. She is unsure the name. Notes upper abdominal pain for approximately a month.  No dysphagia. Appetite is good. States she will go a whole day without making any urine at times. She denies any lower abdominal pain to me other than upper abdomen. States while breathing in, it feels like something is squeezing her. She is on Plavix, 81 mg aspirin as well. Prilosec daily as outpatient. No constipation or diarrhea, no changes in bowel habits.   Colonoscopy in 2009 by Dr. Gala Romney with cluster of diminutive rectal polyps and 2 diminutive polyps in rectosigmoid junction, all hyperplastic. She is on Cipro and Flagyl.   Past Medical History:  Diagnosis Date  . Arthritis   . CHF (congestive heart failure) ([place])   . Chronic back pain   . COPD (chronic obstructive pulmonary disease) ([facility])    2L home O2  . Diabetes mellitus without complication ([place])   . Hypertension     Past Surgical History:  Procedure Laterality Date  . ABDOMINAL HYSTERECTOMY    . colonoscopy  2009   Dr. Gala Romney: cluster of diminutive  rectal polyps and 2 diminutive polyps in rectosigmoid junction, hyperplastic  . left hand middle finger     in an accident at work, amputated    Prior to Admission medications   Medication Sig Start Date End Date Taking? Authorizing Provider  aspirin 81 MG tablet Take 2 tablets by mouth daily.  10/22/15  Yes [provider]  atorvastatin (LIPITOR) 80 MG tablet Take 80 mg by mouth daily.  12/04/16  Yes [provider]  budesonide-formoterol (SYMBICORT) 160-4.5 MCG/ACT inhaler Inhale 2 puffs into the lungs 2 (two) times daily. 11/23/15  Yes Barton Dubois, MD  buPROPion [facility] SR) 150 MG 12 hr tablet Take 150 mg by mouth 2 (two) times daily. 12/04/16  Yes [provider]  clopidogrel (PLAVIX) 75 MG tablet Take 75 mg by mouth daily.   Yes [provider]  famotidine (PEPCID) 20 MG tablet Take 20 mg by mouth 2 (two) times daily.   Yes [provider]  ipratropium-albuterol (DUONEB) 0.5-2.5 (3) MG/3ML SOLN Take 3 mLs by nebulization every 6 (six) hours as needed (for shortness of breath).   Yes [provider]  Linaclotide (LINZESS) 145 MCG CAPS capsule Take 145 mcg by mouth daily.   Yes [provider]  metFORMIN (GLUCOPHAGE-XR) 500 MG 24 hr tablet Take 500 mg by mouth 2 (two) times daily. 12/04/16  Yes [provider]  naproxen sodium (ANAPROX) 220 MG tablet Take 440 mg by mouth daily as needed (  pain).   Yes [provider]  omega-3 acid ethyl esters (LOVAZA) 1 g capsule Take 2 g by mouth 2 (two) times daily.  12/07/16  Yes [provider]  omeprazole (PRILOSEC) 20 MG capsule Take 20 mg by mouth daily. 12/04/16  Yes [provider]  OXYGEN Inhale 2 L into the lungs continuous.   Yes [provider]  oxymorphone (OPANA ER) 5 MG 12 hr tablet Take 5 mg by mouth daily.   Yes [provider]  saxagliptin HCl (ONGLYZA) 5 MG TABS tablet Take 5 mg by mouth daily. Reported on 09/22/2015   Yes  [provider]  valsartan-hydrochlorothiazide (DIOVAN-HCT) 160-12.5 MG per tablet Take 1 tablet by mouth daily.   Yes [provider]    Current Facility-Administered Medications  Medication Dose Route Frequency Provider Last Rate Last Dose  . acetaminophen (TYLENOL) tablet 650 mg  650 mg Oral Q6H PRN Karmen Bongo, MD       Or  . acetaminophen (TYLENOL) suppository 650 mg  650 mg Rectal Q6H PRN Karmen Bongo, MD      . albuterol (PROVENTIL) (2.5 MG/3ML) 0.083% nebulizer solution 2.5 mg  2.5 mg Nebulization Q2H PRN Karmen Bongo, MD      . atorvastatin (LIPITOR) tablet 80 mg  80 mg Oral q1800 Karmen Bongo, MD   80 mg at 08/27/17 2333  . buPROPion (WELLBUTRIN SR) 12 hr tablet 150 mg  150 mg Oral BID Karmen Bongo, MD      . ciprofloxacin (CIPRO) IVPB 400 mg  400 mg Intravenous Lillia Mountain, MD 200 mL/hr at 08/28/17 1024 400 mg at 08/28/17 1024  . famotidine (PEPCID) tablet 20 mg  20 mg Oral BID Karmen Bongo, MD   20 mg at 08/27/17 2333  . hydrALAZINE (APRESOLINE) injection 5 mg  5 mg Intravenous Q4H PRN Karmen Bongo, MD      . insulin aspart (novoLOG) injection 0-15 Units  0-15 Units Subcutaneous TID WC Karmen Bongo, MD   3 Units at 08/28/17 1158  . ipratropium-albuterol (DUONEB) 0.5-2.5 (3) MG/3ML nebulizer solution 3 mL  3 mL Nebulization TID Karmen Bongo, MD   3 mL at 08/28/17 0801  . lactated ringers infusion   Intravenous Continuous Karmen Bongo, MD 100 mL/hr at 08/27/17 2345    . methylPREDNISolone sodium succinate (SOLU-MEDROL) 125 mg/2 mL injection 60 mg  60 mg Intravenous Lillia Mountain, MD   60 mg at 08/28/17 1159  . metroNIDAZOLE (FLAGYL) IVPB 500 mg  500 mg Intravenous Lynne Logan, MD 100 mL/hr at 08/28/17 0600 500 mg at 08/28/17 0600  . morphine 2 MG/ML injection 2 mg  2 mg Intravenous Q2H PRN Karmen Bongo, MD      . nicotine (NICODERM CQ - dosed in mg/24 hours) patch 14 mg  14 mg Transdermal Daily Karmen Bongo,  MD   14 mg at 08/28/17 1029  . ondansetron (ZOFRAN) tablet 4 mg  4 mg Oral Q6H PRN Karmen Bongo, MD       Or  . ondansetron [facility]) injection 4 mg  4 mg Intravenous Q6H PRN Karmen Bongo, MD      . pantoprazole (PROTONIX) injection 40 mg  40 mg Intravenous Lillia Mountain, MD   40 mg at 08/28/17 1024    Allergies as of 08/27/2017  . (No Known Allergies)    Family History  Problem Relation Age of Onset  . CVA Mother 88  . Cancer Neg Hx   . Colon cancer Neg Hx  Social History   Socioeconomic History  . Marital status: Divorced    Spouse name: Not on file  . Number of children: Not on file  . Years of education: Not on file  . Highest education level: Not on file  Social Needs  . Financial resource strain: Not on file  . Food insecurity - worry: Not on file  . Food insecurity - inability: Not on file  . Transportation needs - medical: Not on file  . Transportation needs - non-medical: Not on file  Occupational History  . Occupation: retired  Tobacco Use  . Smoking status: Current Every Day Smoker    Packs/day: 1.00    Years: 52.00    Pack years: 52.00    Types: Cigarettes  . Smokeless tobacco: Never Used  Substance and Sexual Activity  . Alcohol use: No  . Drug use: No  . Sexual activity: Not Currently  Other Topics Concern  . Not on file  Social History Narrative   ** Merged History Encounter **        Review of Systems: Gen: Denies fever, chills, loss of appetite, change in weight or weight loss CV: Denies chest pain, heart palpitations, syncope, edema  Resp: see HPI  GI: see HPI  GU : Denies urinary burning, urinary frequency, urinary incontinence.  MS: +back pain  Derm: Denies rash, itching, dry skin Psych: Denies depression, anxiety,confusion, or memory loss Heme: Denies bruising, bleeding, and enlarged lymph nodes.  Physical Exam: Vital signs in last 24 hours: Temp:  [97.8 F (36.6 C)-98.7 F (37.1 C)] 97.8 F (36.6 C) (12/04  0555) Pulse Rate:  [81-117] 103 (12/04 0555) Resp:  [15-30] 15 (12/04 0555) BP: (130-167)/(46-108) 151/64 (12/04 0555) SpO2:  [93 %-100 %] 94 % (12/04 0801) Weight:  [175 lb 11.2 oz (79.7 kg)-176 lb (79.8 kg)] 175 lb 11.2 oz (79.7 kg) (12/03 2134) Last BM Date: 08/26/17 General:   Alert,  Well-developed, well-nourished, pleasant and cooperative in NAD Head:  Normocephalic and atraumatic. Eyes:  Sclera clear, no icterus.   Conjunctiva pink. Ears:  Normal auditory acuity. Nose:  No deformity, discharge,  or lesions. Mouth:  No deformity or lesions, dentition normal. Lungs:  Clear throughout to auscultation.    Heart:  S1 S2 present without murmurs  Abdomen:  Soft, nontender and nondistended. No masses, hepatosplenomegaly or hernias noted. Normal bowel sounds, without guarding, and without rebound.   Rectal:  Deferred until time of colonoscopy.   Msk:  Symmetrical without gross deformities. Normal posture. Pulses:  Normal pulses noted. Extremities:  Without clubbing or edema. Neurologic:  Alert and  oriented x4;  grossly normal neurologically. Skin:  Intact without significant lesions or rashes. Cervical Nodes:  No significant cervical adenopathy. Psych:  Alert and cooperative. Normal mood and affect.  Intake/Output from previous day: No intake/output data recorded. Intake/Output this shift: No intake/output data recorded.  Lab Results: Recent Labs    08/27/17 1628 08/28/17 0436  WBC 8.4 6.1  HGB 7.5* 7.1*  HCT 25.3* 23.9*  PLT 277 297   BMET Recent Labs    08/27/17 1628 08/28/17 0436  NA 140 138  K 4.1 4.6  CL 104 104  CO2 28 28  GLUCOSE 112* 159*  BUN 25* 22*  CREATININE 1.62* 1.33*  CALCIUM 8.8* 8.9    Studies/Results: Dg Chest 2 View  Result Date: 08/27/2017 CLINICAL DATA:  Progressive shortness of breath for 1 week. Chest pain. Productive cough. Chest congestion. COPD. EXAM: CHEST  2 VIEW COMPARISON:  02/11/2017 and 12/18/2016 FINDINGS: There is chronic  interstitial and obstructive lung disease with pleural based calcification in the right hemithorax. No acute infiltrates or effusions. Mild cardiomegaly. Pulmonary vascularity is normal. Aortic atherosclerosis. By no discrete effusions. No significant bone abnormality. IMPRESSION: By 1. Chronic interstitial and obstructive lung disease without acute abnormality. 2. Mild cardiomegaly. 3.  Aortic Atherosclerosis (ICD10-I70.0). Electronically Signed   By: Lorriane Shire M.D.   On: 08/27/2017 13:32   Ct Abdomen Pelvis W Contrast  Result Date: 08/27/2017 CLINICAL DATA:  66 y/o female with shortness of Breath. Blood in stool. EXAM: CT ABDOMEN AND PELVIS WITH CONTRAST TECHNIQUE: Multidetector CT imaging of the abdomen and pelvis was performed using the standard protocol following bolus administration of intravenous contrast. CONTRAST:  12mL ISOVUE-300 IOPAMIDOL (ISOVUE-300) INJECTION 61% COMPARISON:  CT Abdomen and Pelvis 09/18/2007. FINDINGS: Lower chest: Chronic cardiomegaly.  No pericardial effusion. Chronic bilateral lower lobe pleural thickening and small calcified pleural plaques. Superimposed chronic bilateral lung base architectural distortion which has progressed since 2008. Areas of honeycombing in the right lower lobe. No right pleural effusion or definite acute pulmonary opacity. Hepatobiliary: The gallbladder is distended but there is no pericholecystic inflammation. No biliary ductal dilatation. Normal liver enhancement. Pancreas: Negative. Spleen: Negative. Adrenals/Urinary Tract: Normal adrenal glands. The right kidney has severely atrophied since 2008. The left kidney appears stable to mildly hypertrophied since the prior CT. There is normal appearing left renal contrast excretion on the delayed images. No right renal contrast excretion. Negative course of the left ureter. Unremarkable urinary bladder. Stomach/Bowel: Negative rectum. Mild to moderate diverticulosis in the sigmoid colon and descending  colon. Best seen on coronal image 51 there is a small area of acute mesenteric stranding at the level of the proximal sigmoid colon. This is more subtle on the axial images. No other active inflammation identified in the region. Negative splenic flexure, transverse colon and right colon aside from retained stool. Appendix is diminutive or absent. Flocculated material in the mid and distal small bowel which otherwise appears normal. Oral contrast has not yet reached the distal small bowel. No dilated small bowel. Chronic juxta phrenic gastric diverticulum is stable (series 2, image 16). Otherwise negative stomach. Negative duodenum ; small chronic duodenum lipoma is unchanged (series 2, image 33). No abdominal free fluid or free air. Vascular/Lymphatic: Extensive Aortoiliac calcified atherosclerosis. Major arterial structures in the abdomen and pelvis are patent. Portal venous system is patent. No lymphadenopathy. Reproductive: Surgically absent. Other: No pelvic free fluid. Postoperative changes to the ventral low or abdominal wall appears stable. Musculoskeletal: No acute osseous abnormality identified. IMPRESSION: 1. Diverticulosis in the distal colon with mild acute inflammation at the proximal sigmoid compatible with mild acute diverticulitis. No abscess or complicating features. 2. No other acute findings in the abdomen or pelvis. 3. Severe right renal atrophy since 2008. 4. Extensive Aortoiliac calcified atherosclerosis. 5. Chronic lung disease with lower lobe pleural plaques and developing pulmonary fibrosis. Electronically Signed   By: Genevie Ann M.D.   On: 08/27/2017 20:51    Impression: 66 year old female presenting with acute on chronic respiratory failure/COPD exacerbation in setting of new onset microcytic anemia and Hgb 7.5 on admission. No overt GI bleeding noted, but she was heme positive in ED. Endorses large amounts of Ibuprofen and some type of unknown pain reliever OTC. Epigastric pain noted for  past month but no other concerning upper or lower GI symptoms. Found to have mild acute sigmoid diverticulitis via CT imaging.   Microcytic anemia: new  onset, with normal Hgb 6 months ago. Last colonoscopy in 2009 with hyperplastic polyps, and no prior EGD. Concern for gastritis/PUD in setting of NSAIDs, as she notes worsening epigastric pain over past month. Although heme positive, she has no overt GI bleeding. Will need colonoscopy/EGD as outpatient once treated for diverticulitis. If no improvement with abdominal pain while inpatient, could consider EGD only prior to discharge with colonoscopy as outpatient.  Diverticulitis: uncomplicated, without typical features consistent with diverticulitis. Once treated, will need colonoscopy as outpatient.    Plan: May have full liquids, advancing as tolerated to low residue Cipro and Flagyl for diverticulitis: transition to oral once tolerating diet Agree with PPI BID Colonoscopy as outpatient in 4-6 weeks Consider EGD as inpatient if no improvement in epigastric pain despite BID PPI Avoidance of NSAIDs.  Follow H/H, transfuse as needed   Annitta Needs, PhD, ANP-BC [facility]     LOS: 1 day    08/28/2017, 12:10 PM

## 2017-08-28 NOTE — Care Management Note (Signed)
Case Management Note  Patient Details  Name: ANAELLE DUNTON MRN: [id number] Date of Birth: 1950-10-19  Subjective/Objective:         Admitted with COPD exacerbation. From home, ind with ADL's, she has PCP, transportation and insurance with drug coverage. She plans to return home at DC. She has home oxygen PTA. Company expected to come services concentrator once she returns home. PT recommending rollator. Pt agreeable, has no preference of DME provider. No needs or concerns communicated about DC.            Action/Plan: DC home with self care. Vaughan Basta, [facility] rep, given DME referral. She will pull pt info from chart and deliver DME to pt room prior to.   Expected Discharge Date:  08/30/17               Expected Discharge Plan:  Home/Self Care  In-House Referral:  NA  Discharge planning Services  CM Consult  Post Acute Care Choice:  Durable Medical Equipment Choice offered to:  Patient  DME Arranged:  Gilford Rile rolling with seat DME Agency:  [place].  Status of Service:  Completed, signed off  Sherald Barge, RN 08/28/2017, 1:55 PM

## 2017-08-28 NOTE — Clinical Social Work Note (Signed)
CSW received consult for COPD Gold. Pt does not meet criteria (3 admissions in a six month period) for this protocol. Will be available if other needs arise.

## 2017-08-28 NOTE — Progress Notes (Signed)
OT Cancellation Note  Patient Details Name: CATTIE TINEO MRN: [id number] DOB: Sep 23, 1951   Cancelled Treatment:    Reason Eval/Treat Not Completed: OT screened, no needs identified, will sign off. Pt independent in B/ADLs this am, wearing O2 for mobility purposes with no SOB. No further OT needs at this time as pt is at functional baseline.    Guadelupe Sabin, OTR/L  [phone] 08/28/2017, 7:47 AM

## 2017-08-28 NOTE — Evaluation (Signed)
Physical Therapy Evaluation Patient Details Name: Erica [name] MRN: [id number] DOB: Oct 08, 1950 Today's Date: 08/28/2017   History of Present Illness  Erica [name] is a 66 y.o. female with medical history significant of HTN, DM, COPD on 2L home O2, chronic pain, and CHF (preserved EF with ungradeable diastolic dysfunction in 9/47) presenting with SOB.  She started feeling it this AM about 0400.  She couldn't sleep.  Unable to lie flat.  +cough, productive of white phlegm x 1 week.  She has been noticing some SOB before today.  No fevers.  No sick contacts.  No change in stools.  Yesterday normal BM.  No n/v.  No chest pain.  She has had SOB several times requiring hospitalization.  She does wear 2L home O2.  Last colonoscopy was about 5 years ago.    Clinical Impression  Patient functioning at baseline for functional mobility and gait.  Patient discharged from physical therapy to care of nursing for ambulation ad lib for length of stay.  Patient will benefit from use of 4 wheeled walker with seat for longer distances when needing to take a sitting rest break.    Follow Up Recommendations No PT follow up    Equipment Recommendations  Other (comment)(4 wheeled walker with seat and brakes)    Recommendations for Other Services       Precautions / Restrictions Precautions Precautions: None Restrictions Weight Bearing Restrictions: No      Mobility  Bed Mobility Overal bed mobility: Independent                Transfers Overall transfer level: Independent                  Ambulation/Gait Ambulation/Gait assistance: Independent Ambulation Distance (Feet): 75 Feet Assistive device: None Gait Pattern/deviations: WFL(Within Functional Limits)   Gait velocity interpretation: Below normal speed for age/gender General Gait Details: grossly WFL except slightly slower than normal cadence, no loss of balance  Stairs            Wheelchair Mobility    Modified  Rankin (Stroke Patients Only)       Balance Overall balance assessment: No apparent balance deficits (not formally assessed)                                           Pertinent Vitals/Pain Pain Assessment: No/denies pain    Home Living Family/patient expects to be discharged to:: Private residence Living Arrangements: Non-relatives/Friends Available Help at Discharge: Available 24 hours/day Type of Home: Mobile home Home Access: Stairs to enter Entrance Stairs-Rails: Right;Left;Can reach both Entrance Stairs-Number of Steps: 4 Home Layout: One level Home Equipment: Walker - 2 wheels;Cane - single point Additional Comments: Patient request the use of Rollator walker due to chronic SOB    Prior Function Level of Independence: Independent               Hand Dominance        Extremity/Trunk Assessment   Upper Extremity Assessment Upper Extremity Assessment: Overall WFL for tasks assessed    Lower Extremity Assessment Lower Extremity Assessment: Overall WFL for tasks assessed    Cervical / Trunk Assessment Cervical / Trunk Assessment: Normal  Communication   Communication: No difficulties  Cognition Arousal/Alertness: Awake/alert Behavior During Therapy: WFL for tasks assessed/performed Overall Cognitive Status: Within Functional Limits for tasks assessed  General Comments      Exercises     Assessment/Plan    PT Assessment Patent does not need any further PT services  PT Problem List         PT Treatment Interventions      PT Goals (Current goals can be found in the Care Plan section)  Acute Rehab PT Goals Patient Stated Goal: return home with use of 4 wheeled walker with seat for longer distances PT Goal Formulation: With patient Time For Goal Achievement: 08/28/17 Potential to Achieve Goals: Good    Frequency     Barriers to discharge        Co-evaluation                AM-PAC PT "6 Clicks" Daily Activity  Outcome Measure Difficulty turning over in bed (including adjusting bedclothes, sheets and blankets)?: None Difficulty moving from lying on back to sitting on the side of the bed? : None Difficulty sitting down on and standing up from a chair with arms (e.g., wheelchair, bedside commode, etc,.)?: None Help needed moving to and from a bed to chair (including a wheelchair)?: None Help needed walking in hospital room?: None Help needed climbing 3-5 steps with a railing? : None 6 Click Score: 24    End of Session   Activity Tolerance: Patient tolerated treatment well Patient left: in bed;with call bell/phone within reach   PT Visit Diagnosis: Unsteadiness on feet (R26.81);Other abnormalities of gait and mobility (R26.89);Muscle weakness (generalized) (M62.81)    Time: 4081-4481 PT Time Calculation (min) (ACUTE ONLY): 21 min   Charges:   PT Evaluation $PT Eval Low Complexity: 1 Low PT Treatments $Gait Training: 8-22 mins   PT G Codes:   PT G-Codes **NOT FOR INPATIENT CLASS** Functional Assessment Tool Used: AM-PAC 6 Clicks Basic Mobility Functional Limitation: Mobility: Walking and moving around Mobility: Walking and Moving Around Current Status (E5631): 0 percent impaired, limited or restricted Mobility: Walking and Moving Around Goal Status (S9702): 0 percent impaired, limited or restricted Mobility: Walking and Moving Around Discharge Status [phone]): 0 percent impaired, limited or restricted    9:57 AM, 08/28/17 Lonell Grandchild, MPT Physical Therapist with [facility] [phone] office [phone] mobile phone

## 2017-08-29 ENCOUNTER — Inpatient Hospital Stay (HOSPITAL_COMMUNITY): Payer: Medicare HMO | Admitting: Anesthesiology

## 2017-08-29 ENCOUNTER — Inpatient Hospital Stay (HOSPITAL_COMMUNITY): Payer: Medicare HMO

## 2017-08-29 ENCOUNTER — Encounter (HOSPITAL_COMMUNITY)
Admission: EM | Disposition: A | Discharge status: Home / Self Care | Payer: Self-pay | Source: Home / Self Care | Attending: Internal Medicine

## 2017-08-29 ENCOUNTER — Encounter (HOSPITAL_COMMUNITY): Payer: Self-pay | Admitting: *Deleted

## 2017-08-29 DIAGNOSIS — E1129 Type 2 diabetes mellitus with other diabetic kidney complication: Secondary | ICD-10-CM

## 2017-08-29 DIAGNOSIS — I10 Essential (primary) hypertension: Secondary | ICD-10-CM

## 2017-08-29 DIAGNOSIS — I340 Nonrheumatic mitral (valve) insufficiency: Secondary | ICD-10-CM

## 2017-08-29 DIAGNOSIS — I509 Heart failure, unspecified: Secondary | ICD-10-CM

## 2017-08-29 DIAGNOSIS — R7989 Other specified abnormal findings of blood chemistry: Secondary | ICD-10-CM

## 2017-08-29 DIAGNOSIS — I5030 Unspecified diastolic (congestive) heart failure: Secondary | ICD-10-CM

## 2017-08-29 DIAGNOSIS — R1013 Epigastric pain: Secondary | ICD-10-CM

## 2017-08-29 DIAGNOSIS — K5792 Diverticulitis of intestine, part unspecified, without perforation or abscess without bleeding: Secondary | ICD-10-CM

## 2017-08-29 DIAGNOSIS — J441 Chronic obstructive pulmonary disease with (acute) exacerbation: Secondary | ICD-10-CM

## 2017-08-29 DIAGNOSIS — R748 Abnormal levels of other serum enzymes: Secondary | ICD-10-CM

## 2017-08-29 DIAGNOSIS — R0902 Hypoxemia: Secondary | ICD-10-CM

## 2017-08-29 DIAGNOSIS — E1122 Type 2 diabetes mellitus with diabetic chronic kidney disease: Secondary | ICD-10-CM

## 2017-08-29 DIAGNOSIS — G894 Chronic pain syndrome: Secondary | ICD-10-CM

## 2017-08-29 DIAGNOSIS — N189 Chronic kidney disease, unspecified: Secondary | ICD-10-CM

## 2017-08-29 DIAGNOSIS — J9620 Acute and chronic respiratory failure, unspecified whether with hypoxia or hypercapnia: Secondary | ICD-10-CM

## 2017-08-29 DIAGNOSIS — J9621 Acute and chronic respiratory failure with hypoxia: Secondary | ICD-10-CM

## 2017-08-29 HISTORY — PX: ESOPHAGOGASTRODUODENOSCOPY (EGD) WITH PROPOFOL: SHX5813

## 2017-08-29 LAB — BASIC METABOLIC PANEL
Anion gap: 7 (ref 5–15)
BUN: 23 mg/dL — AB (ref 6–20)
CO2: 29 mmol/L (ref 22–32)
CREATININE: 1.41 mg/dL — AB (ref 0.44–1.00)
Calcium: 9 mg/dL (ref 8.9–10.3)
Chloride: 103 mmol/L (ref 101–111)
GFR calc Af Amer: 43 mL/min — ABNORMAL LOW (ref >60)
GFR, EST NON AFRICAN AMERICAN: 37 mL/min — AB (ref >60)
GLUCOSE: 154 mg/dL — AB (ref 65–99)
Potassium: 5 mmol/L (ref 3.5–5.1)
SODIUM: 139 mmol/L (ref 135–145)

## 2017-08-29 LAB — GLUCOSE, CAPILLARY
GLUCOSE-CAPILLARY: 162 mg/dL — AB (ref 65–99)
GLUCOSE-CAPILLARY: 115 mg/dL — AB (ref 65–99)
GLUCOSE-CAPILLARY: 113 mg/dL — AB (ref 65–99)
Glucose-Capillary: 142 mg/dL — ABNORMAL HIGH (ref 65–99)
Glucose-Capillary: 124 mg/dL — ABNORMAL HIGH (ref 65–99)

## 2017-08-29 LAB — ECHOCARDIOGRAM COMPLETE
AO mean calculated velocity dopler: 106 cm/s
AOVTI: 34.6 cm
AV Area VTI index: 0.83 cm2/m2
AV Area VTI: 1.67 cm2
AV Mean grad: 6 mmHg
AVAREAMEANV: 1.74 cm2
AVAREAMEANVIN: 0.9 cm2/m2
AVCELMEANRAT: 0.68
AVLVOTPG: 6 mmHg
AVPG: 14 mmHg
AVPKVEL: 184 cm/s
Ao pk vel: 0.66 m/s
CHL CUP AV PEAK INDEX: 0.87
CHL CUP AV VEL: 1.6
CHL CUP MV DEC (S): 109
EWDT: 109 ms
FS: 42 % (ref 28–44)
HEIGHTINCHES: 64 in
IV/PV OW: 1.02
LA diam end sys: 45 mm
LA diam index: 2.34 cm/m2
LA vol: 64.6 mL
LASIZE: 45 mm
LAVOLA4C: 70.4 mL
LAVOLIN: 33.6 mL/m2
LDCA: 2.54 cm2
LV PW d: 12.2 mm — AB (ref 0.6–1.1)
LV dias vol index: 49 mL/m2
LV dias vol: 95 mL (ref 46–106)
LV sys vol index: 19 mL/m2
LVOT SV: 55 mL
LVOT VTI: 21.8 cm
LVOT diameter: 18 mm
LVOT peak VTI: 0.63 cm
LVOT peak vel: 121 cm/s
LVSYSVOL: 37 mL (ref 14–42)
MV pk E vel: 163 m/s
MVPG: 11 mmHg
Simpson's disk: 62
Stroke v: 58 ml
TAPSE: 24.3 mm
Valve area index: 0.83
Valve area: 1.6 cm2
WEIGHTICAEL: 2811.2 [oz_av]

## 2017-08-29 LAB — CBC
HCT: 23.6 % — ABNORMAL LOW (ref 36.0–46.0)
Hemoglobin: 7.2 g/dL — ABNORMAL LOW (ref 12.0–15.0)
MCH: 23.2 pg — ABNORMAL LOW (ref 26.0–34.0)
MCHC: 30.5 g/dL (ref 30.0–36.0)
MCV: 75.9 fL — AB (ref 78.0–100.0)
PLATELETS: 307 10*3/uL (ref 150–400)
RBC: 3.11 MIL/uL — ABNORMAL LOW (ref 3.87–5.11)
RDW: 17.5 % — AB (ref 11.5–15.5)
WBC: 8.9 10*3/uL (ref 4.0–10.5)

## 2017-08-29 LAB — PREPARE RBC (CROSSMATCH)

## 2017-08-29 LAB — ABO/RH: ABO/RH(D): B POS

## 2017-08-29 SURGERY — ESOPHAGOGASTRODUODENOSCOPY (EGD) WITH PROPOFOL
Anesthesia: Monitor Anesthesia Care

## 2017-08-29 MED ORDER — SODIUM CHLORIDE 0.9 % IV SOLN
INTRAVENOUS | Status: DC
  Administered 2017-08-29: 12:00:00 via INTRAVENOUS

## 2017-08-29 MED ORDER — MIDAZOLAM HCL 2 MG/2ML IJ SOLN: 1.0000 mg | Freq: Once | INTRAMUSCULAR | Status: DC | PRN

## 2017-08-29 MED ORDER — MORPHINE SULFATE (PF) 2 MG/ML IV SOLN
2.0000 mg | Freq: Once | INTRAVENOUS | Status: AC
  Administered 2017-08-29: 2 mg via INTRAVENOUS

## 2017-08-29 MED ORDER — ONDANSETRON 4 MG PO TBDP: 4.0000 mg | ORAL_TABLET | Freq: Once | ORAL | Status: DC

## 2017-08-29 MED ORDER — BUDESONIDE 0.5 MG/2ML IN SUSP
0.5000 mg | Freq: Two times a day (BID) | RESPIRATORY_TRACT | Status: DC
  Administered 2017-08-29 – 2017-08-30 (×2): 0.5 mg via RESPIRATORY_TRACT
  Filled 2017-08-29 (×2): qty 2

## 2017-08-29 MED ORDER — SODIUM CHLORIDE 0.9 % IV SOLN: Freq: Once | INTRAVENOUS | Status: DC

## 2017-08-29 MED ORDER — LACTATED RINGERS IV SOLN: INTRAVENOUS | Status: DC

## 2017-08-29 MED ORDER — SODIUM CHLORIDE 0.9 % IV SOLN
INTRAVENOUS | Status: AC
  Administered 2017-08-29: 14:00:00 via INTRAVENOUS

## 2017-08-29 MED ORDER — MIDAZOLAM HCL 2 MG/2ML IJ SOLN
1.0000 mg | Freq: Once | INTRAMUSCULAR | Status: AC | PRN
  Administered 2017-08-29: 2 mg via INTRAVENOUS

## 2017-08-29 MED ORDER — METOPROLOL TARTRATE 5 MG/5ML IV SOLN
INTRAVENOUS | Status: AC
  Filled 2017-08-29: qty 5

## 2017-08-29 MED ORDER — ONDANSETRON 4 MG PO TBDP
ORAL_TABLET | ORAL | Status: AC
  Filled 2017-08-29: qty 1

## 2017-08-29 MED ORDER — LIDOCAINE VISCOUS 2 % MT SOLN
15.0000 mL | Freq: Once | OROMUCOSAL | Status: AC
  Administered 2017-08-29: 5 mL via OROMUCOSAL

## 2017-08-29 MED ORDER — MIDAZOLAM HCL 2 MG/2ML IJ SOLN
INTRAMUSCULAR | Status: AC
  Filled 2017-08-29: qty 2

## 2017-08-29 MED ORDER — METOPROLOL TARTRATE 5 MG/5ML IV SOLN
2.0000 mg | Freq: Once | INTRAVENOUS | Status: AC
  Administered 2017-08-29: 2 mg via INTRAVENOUS

## 2017-08-29 MED ORDER — ONDANSETRON 4 MG PO TBDP
4.0000 mg | ORAL_TABLET | Freq: Once | ORAL | Status: AC
  Administered 2017-08-29: 4 mg via ORAL

## 2017-08-29 MED ORDER — PROPOFOL 500 MG/50ML IV EMUL
INTRAVENOUS | Status: DC | PRN
  Administered 2017-08-29: 150 ug/kg/min via INTRAVENOUS

## 2017-08-29 MED ORDER — LIDOCAINE VISCOUS 2 % MT SOLN
OROMUCOSAL | Status: AC
  Filled 2017-08-29: qty 15

## 2017-08-29 MED ORDER — MORPHINE SULFATE (PF) 2 MG/ML IV SOLN
INTRAVENOUS | Status: AC
  Filled 2017-08-29: qty 1

## 2017-08-29 MED ORDER — METOPROLOL TARTRATE 25 MG PO TABS
12.5000 mg | ORAL_TABLET | Freq: Two times a day (BID) | ORAL | Status: DC
  Administered 2017-08-29 – 2017-08-30 (×2): 12.5 mg via ORAL
  Filled 2017-08-29 (×2): qty 1

## 2017-08-29 NOTE — Op Note (Addendum)
[facility] Patient Name: Erica [name] Procedure Date: 08/29/2017 10:51 AM MRN: [id number] Date of Birth: Nov 29, 1950 Attending MD: Norvel Richards , MD CSN: [id number] Age: 66 Admit Type: Inpatient Procedure:                Upper GI endoscopy Indications:              Epigastric abdominal pain, , Heme positive stool Providers:                Norvel Richards, MD, Otis Peak B. Sharon Seller, RN,                            Nelma Rothman, Technician, Aram Candela Referring MD:             Barrie Folk. Hill MD, MD Medicines:                Propofol per Anesthesia Complications:            No immediate complications. Estimated Blood Loss:     Estimated blood loss: none. Procedure:                Pre-Anesthesia Assessment:                           - Prior to the procedure, a History and Physical                            was performed, and patient medications and                            allergies were reviewed. The patient's tolerance of                            previous anesthesia was also reviewed. The risks                            and benefits of the procedure and the sedation                            options and risks were discussed with the patient.                            All questions were answered, and informed consent                            was obtained. Prior Anticoagulants: The patient has                            taken no previous anticoagulant or antiplatelet                            agents. ASA Grade Assessment: II - A patient with                            mild systemic disease. After reviewing the risks  and benefits, the patient was deemed in                            satisfactory condition to undergo the procedure.                           After obtaining informed consent, the endoscope was                            passed under direct vision. Throughout the                            procedure, the patient's blood  pressure, pulse, and                            oxygen saturations were monitored continuously. The                            EG-[id number] ([id number]) scope was introduced through the                            and advanced to the second part of duodenum. The                            upper GI endoscopy was accomplished without                            difficulty. The patient tolerated the procedure                            well. Scope In: 11:34:40 AM Scope Out: 11:38:31 AM Total Procedure Duration: 0 hours 3 minutes 51 seconds  Findings:      The examined esophagus was normal.      A small hiatal hernia was present. 4 cm fundal diverticulum present.      stomach normal otherwise.      The duodenal bulb and second portion of the duodenum were normal. Impression:               - Normal esophagus.                           - Small hiatal hernia. gastric fundal diverticulum                           -                           - Normal duodenal bulb and second portion of the                            duodenum.                           - No specimens collected. No explanation for  epigastric pain or bleeding on today's examination.                            Patient may be experiencing some dyspepsia from                            recent NSAID use. Moderate Sedation:      Moderate (conscious) sedation was personally administered by an       anesthesia professional. The following parameters were monitored: oxygen       saturation, heart rate, blood pressure, respiratory rate, EKG, adequacy       of pulmonary ventilation, and response to care. Total physician       intraservice time was 12 minutes. Recommendation:           - Return patient to hospital ward for ongoing care.                           - Clear liquid diet.                           - Continue present medications. Complete treatment                            for diverticulitis.                            - No repeat upper endoscopy.                           - Return to GI office in 1 month. Colonoscopy in                            several weeks.                           Addendum: Following the procedure, patient                            complained of chest pain and tachycardia. SVT noted                            at about a rate of 140. Given Lopressor and                            morphine per anesthesia. tachycardia resolved. No                            EKG changes. Chest pain resolved completely.                           It is recommended patient receive 1 unit of packed                            red cells. Procedure Code(s):        --- Professional ---  93570, Esophagogastroduodenoscopy, flexible,                            transoral; diagnostic, including collection of                            specimen(s) by brushing or washing, when performed                            (separate procedure) Diagnosis Code(s):        --- Professional ---                           K44.9, Diaphragmatic hernia without obstruction or                            gangrene                           R10.13, Epigastric pain                           R19.5, Other fecal abnormalities CPT copyright 2016 [employer]. All rights reserved. The codes documented in this report are preliminary and upon coder review may  be revised to meet current compliance requirements. Cristopher Estimable. Jenaveve Fenstermaker, MD Norvel Richards, MD 08/29/2017 11:48:21 AM This report has been signed electronically. Number of Addenda: 0

## 2017-08-29 NOTE — Transfer of Care (Signed)
Immediate Anesthesia Transfer of Care Note  Patient: Erica [name]  Procedure(s) Performed: ESOPHAGOGASTRODUODENOSCOPY (EGD) WITH PROPOFOL (N/A )  Patient Location: PACU  Anesthesia Type:MAC  Level of Consciousness: awake, alert , oriented and patient cooperative  Airway & Oxygen Therapy: Patient Spontanous Breathing and Patient connected to face mask oxygen  Post-op Assessment: Report given to RN and Post -op Vital signs reviewed and stable  Post vital signs: Reviewed and stable  Last Vitals:  Vitals:   08/29/17 1115 08/29/17 1145  BP:  (!) 170/73  Pulse:  (!) 104  Resp: (!) 30 (!) 26  Temp:  36.6 C  SpO2: 94% 100%    Last Pain:  Vitals:   08/29/17 1145  TempSrc:   PainSc: 8       Patients Stated Pain Goal: 4 (96/28/36 6294)  Complications: No apparent anesthesia complications

## 2017-08-29 NOTE — Progress Notes (Signed)
Patient seen and examined in short stay. Patient remained stable. Hemoglobin stable at 7.2.  Denies any dysphagia. Agree with need for EGD. Discussed with anesthesia.  The risks, benefits, limitations, alternatives and imponderables have been reviewed with the patient. Potential for esophageal dilation, biopsy, etc. have also been reviewed.  Questions have been answered. All parties agreeable.

## 2017-08-29 NOTE — Anesthesia Postprocedure Evaluation (Signed)
Anesthesia Post Note  Patient: Erica [name]  Procedure(s) Performed: ESOPHAGOGASTRODUODENOSCOPY (EGD) WITH PROPOFOL (N/A )  Patient location during evaluation: PACU Anesthesia Type: MAC Level of consciousness: awake and alert and oriented Pain management: pain level controlled Vital Signs Assessment: post-procedure vital signs reviewed and stable Respiratory status: spontaneous breathing and patient connected to nasal cannula oxygen Cardiovascular status: stable Anesthetic complications: no     Last Vitals:  Vitals:   08/29/17 1115 08/29/17 1145  BP:  (!) 170/73  Pulse:  (!) 104  Resp: (!) 30 (!) 26  Temp:  36.6 C  SpO2: 94% 100%    Last Pain:  Vitals:   08/29/17 1145  TempSrc:   PainSc: 8                  Eragon Hammond A

## 2017-08-29 NOTE — Progress Notes (Signed)
PROGRESS NOTE  Erica [name] VZD:[id number] DOB: 02/18/1951 DOA: 08/27/2017 PCP: Iona Beard, MD  Brief History:  66 year old female with a history of COPD, chronic respiratory failure on 2 L, diabetes mellitus, hypertension, diastolic CHF presented with 1 day history of shortness of breath and 1 week history of cough with white sputum.  The patient denied any fevers, chills, nausea, vomiting, diarrhea.  Oxygen saturation dropped to the 80s in the emergency department with ambulation.  Hemoglobin was noted to be 7.5.  Hemoccult was positive.  The patient also complained of left lower quadrant abdominal pain for which a CT of the abdomen and pelvis was performed on August 27, 2017.  The CT of the abdomen and pelvis revealed diverticulosis of the distended colon with mild acute inflammatory changes in the proximal sigmoid consistent with mild acute diverticulitis.  The patient was started on IV Solu-Medrol and bronchodilators.  Cipro and Flagyl was started for her diverticulitis.  Assessment/Plan: Acute on chronic respiratory failure with hypoxia -Secondary COPD exacerbation -She has been weaned back to her home 2 L  COPD exacerbation -Continue Solu-Medrol -Continue bronchodilators -Add Pulmicort  Chronic blood loss anemia/FOBT positive/Symptomatic Anemia -Source of bleeding is unclear, but pt with known NSAID use -Hemoglobin on Feb 12, 2017--12.1 -Presenting hemoglobin--7.5 -08/29/17 EGD--gastric fundal diverticulum, normal duodenum, small hiatus hernia -transfuse one unit PRBC -appreciate GI follow up -08/29/17--EGD--small hiatus hernia, gastric fundal diverticulum, normal duodenum.  SVT -pt had HR into 140s right after endoscopy 12/5--resolved with morphine, lopressor IV x 1 -Echo -TSH -place on telemetry -start low dose metoprolol tartrate  Acute diverticulitis -Pain is improved, continue Cipro and Flagyl for now. -CT abdomen as discussed above  CKD stage  III -Baseline creatinine 1.2-1.5 -Holding ARB/diuretic  Elevated troponin -Flat, highest of 0.06.  No chest pain. -EKG without acute ischemic changes. -Echo in May 2018 with preserved ejection fraction, do not believe further cardiac workup is indicated at this time.  Chronic diastolic heart failure -Echo in May 2018 with ejection fraction of 60-65% and grade 1 diastolic dysfunction. -Is compensated at present.  Diabetes mellitus type 2, controlled  -CBGs elevated secondary to steroids -Continue NovoLog sliding scale -Holding metformin and saxagliptin  Essential hypertension -Holding valsartan/HCTZ in the setting of CKD     Disposition Plan:   Home in 1-2 days  Family Communication: No  Family at bedside  Consultants:  GI  Code Status:  FULL   DVT Prophylaxis:  SCD   Procedures: As Listed in Progress Note Above  Antibiotics: None    Subjective: Overall, she states that she is breathing better but not yet back to baseline.  She denies any fevers, chills, headache, chest pain, nausea, vomiting, diarrhea, dysuria.  Her abdominal pain is improving.  Objective: Vitals:   08/29/17 1110 08/29/17 1115 08/29/17 1145 08/29/17 1215  BP:   (!) 170/73 132/62  Pulse:   (!) 104 82  Resp: (!) 21 (!) 30 (!) 26 14  Temp:   97.9 F (36.6 C)   TempSrc:      SpO2: 96% 94% 100% 99%  Weight:      Height:        Intake/Output Summary (Last 24 hours) at 08/29/2017 1432 Last data filed at 08/29/2017 1159 Gross per 24 hour  Intake 4605 ml  Output 450 ml  Net 4155 ml   Weight change:  Exam:   General:  Pt is alert, follows commands appropriately, not in acute distress  HEENT: No icterus, No thrush, No neck mass, Tool/AT  Cardiovascular: RRR, S1/S2, no rubs, no gallops  Respiratory: Bilateral scattered rales.  Bibasilar wheezing.  Good air movement.  Abdomen: Soft/+BS, non tender, non distended, no guarding  Extremities: No edema, No lymphangitis, No petechiae, No  rashes, no synovitis   Data Reviewed: I have personally reviewed following labs and imaging studies Basic Metabolic Panel: Recent Labs  Lab 08/27/17 1628 08/28/17 0436 08/29/17 0502  NA 140 138 139  K 4.1 4.6 5.0  CL 104 104 103  CO2 28 28 29   GLUCOSE 112* 159* 154*  BUN 25* 22* 23*  CREATININE 1.62* 1.33* 1.41*  CALCIUM 8.8* 8.9 9.0   Liver Function Tests: No results for input(s): AST, ALT, ALKPHOS, BILITOT, PROT, ALBUMIN in the last 168 hours. No results for input(s): LIPASE, AMYLASE in the last 168 hours. No results for input(s): AMMONIA in the last 168 hours. Coagulation Profile: No results for input(s): INR, PROTIME in the last 168 hours. CBC: Recent Labs  Lab 08/27/17 1628 08/28/17 0436 08/29/17 0502  WBC 8.4 6.1 8.9  NEUTROABS 5.4  --   --   HGB 7.5* 7.1* 7.2*  HCT 25.3* 23.9* 23.6*  MCV 76.7* 75.9* 75.9*  PLT 277 297 307   Cardiac Enzymes: Recent Labs  Lab 08/27/17 1628 08/27/17 2249 08/28/17 0436 08/28/17 0958  TROPONINI 0.05* 0.06* 0.05* 0.04*   BNP: Invalid input(s): POCBNP CBG: Recent Labs  Lab 08/28/17 1648 08/28/17 2115 08/29/17 0754 08/29/17 1110 08/29/17 1206  GLUCAP 124* 118* 162* 142* 115*   HbA1C: No results for input(s): HGBA1C in the last 72 hours. Urine analysis:    Component Value Date/Time   COLORURINE YELLOW 03/14/2013 1847   APPEARANCEUR CLEAR 03/14/2013 1847   LABSPEC 1.025 03/14/2013 1847   PHURINE 6.0 03/14/2013 1847   GLUCOSEU NEGATIVE 03/14/2013 1847   HGBUR NEGATIVE 03/14/2013 1847   BILIRUBINUR NEGATIVE 03/14/2013 1847   KETONESUR NEGATIVE 03/14/2013 1847   PROTEINUR NEGATIVE 03/14/2013 1847   UROBILINOGEN 0.2 03/14/2013 1847   NITRITE NEGATIVE 03/14/2013 1847   LEUKOCYTESUR NEGATIVE 03/14/2013 1847   Sepsis Labs: @LABRCNTIP (procalcitonin:4,lacticidven:4) )No results found for this or any previous visit (from the past 240 hour(s)).   Scheduled Meds: . amLODipine  5 mg Oral Daily  . atorvastatin  80 mg  Oral q1800  . buPROPion  150 mg Oral BID  . famotidine  20 mg Oral BID  . insulin aspart  0-15 Units Subcutaneous TID WC  . ipratropium-albuterol  3 mL Nebulization TID  . methylPREDNISolone (SOLU-MEDROL) injection  60 mg Intravenous Q12H  . nicotine  14 mg Transdermal Daily  . pantoprazole (PROTONIX) IV  40 mg Intravenous Q12H   Continuous Infusions: . sodium chloride 50 mL/hr at 08/29/17 1426  . ciprofloxacin Stopped (08/29/17 1100)  . metronidazole 500 mg (08/29/17 1427)    Procedures/Studies: Dg Chest 2 View  Result Date: 08/27/2017 CLINICAL DATA:  Progressive shortness of breath for 1 week. Chest pain. Productive cough. Chest congestion. COPD. EXAM: CHEST  2 VIEW COMPARISON:  02/11/2017 and 12/18/2016 FINDINGS: There is chronic interstitial and obstructive lung disease with pleural based calcification in the right hemithorax. No acute infiltrates or effusions. Mild cardiomegaly. Pulmonary vascularity is normal. Aortic atherosclerosis. By no discrete effusions. No significant bone abnormality. IMPRESSION: By 1. Chronic interstitial and obstructive lung disease without acute abnormality. 2. Mild cardiomegaly. 3.  Aortic Atherosclerosis (ICD10-I70.0). Electronically Signed   By: Lorriane Shire M.D.   On: 08/27/2017 13:32   Ct Abdomen  Pelvis W Contrast  Result Date: 08/27/2017 CLINICAL DATA:  66 y/o female with shortness of Breath. Blood in stool. EXAM: CT ABDOMEN AND PELVIS WITH CONTRAST TECHNIQUE: Multidetector CT imaging of the abdomen and pelvis was performed using the standard protocol following bolus administration of intravenous contrast. CONTRAST:  30mL ISOVUE-300 IOPAMIDOL (ISOVUE-300) INJECTION 61% COMPARISON:  CT Abdomen and Pelvis 09/18/2007. FINDINGS: Lower chest: Chronic cardiomegaly.  No pericardial effusion. Chronic bilateral lower lobe pleural thickening and small calcified pleural plaques. Superimposed chronic bilateral lung base architectural distortion which has progressed  since 2008. Areas of honeycombing in the right lower lobe. No right pleural effusion or definite acute pulmonary opacity. Hepatobiliary: The gallbladder is distended but there is no pericholecystic inflammation. No biliary ductal dilatation. Normal liver enhancement. Pancreas: Negative. Spleen: Negative. Adrenals/Urinary Tract: Normal adrenal glands. The right kidney has severely atrophied since 2008. The left kidney appears stable to mildly hypertrophied since the prior CT. There is normal appearing left renal contrast excretion on the delayed images. No right renal contrast excretion. Negative course of the left ureter. Unremarkable urinary bladder. Stomach/Bowel: Negative rectum. Mild to moderate diverticulosis in the sigmoid colon and descending colon. Best seen on coronal image 51 there is a small area of acute mesenteric stranding at the level of the proximal sigmoid colon. This is more subtle on the axial images. No other active inflammation identified in the region. Negative splenic flexure, transverse colon and right colon aside from retained stool. Appendix is diminutive or absent. Flocculated material in the mid and distal small bowel which otherwise appears normal. Oral contrast has not yet reached the distal small bowel. No dilated small bowel. Chronic juxta phrenic gastric diverticulum is stable (series 2, image 16). Otherwise negative stomach. Negative duodenum ; small chronic duodenum lipoma is unchanged (series 2, image 33). No abdominal free fluid or free air. Vascular/Lymphatic: Extensive Aortoiliac calcified atherosclerosis. Major arterial structures in the abdomen and pelvis are patent. Portal venous system is patent. No lymphadenopathy. Reproductive: Surgically absent. Other: No pelvic free fluid. Postoperative changes to the ventral low or abdominal wall appears stable. Musculoskeletal: No acute osseous abnormality identified. IMPRESSION: 1. Diverticulosis in the distal colon with mild acute  inflammation at the proximal sigmoid compatible with mild acute diverticulitis. No abscess or complicating features. 2. No other acute findings in the abdomen or pelvis. 3. Severe right renal atrophy since 2008. 4. Extensive Aortoiliac calcified atherosclerosis. 5. Chronic lung disease with lower lobe pleural plaques and developing pulmonary fibrosis. Electronically Signed   By: Genevie Ann M.D.   On: 08/27/2017 20:51    Orson Eva, DO  [facility] Pager [phone]  If 7PM-7AM, please contact night-coverage [email or web address] Password [email or web address] 08/29/2017, 2:32 PM   LOS: 2 days

## 2017-08-29 NOTE — Discharge Summary (Addendum)
Physician Discharge Summary  Erica [name] IRJ:[id number] DOB: 12-11-1950 DOA: 08/27/2017  PCP: Iona Beard, MD  Admit date: 08/27/2017 Discharge date: 08/30/2017  Admitted From: Home Disposition:  Home  Recommendations for Outpatient Follow-up:  1. Follow up with PCP in 1-2 weeks 2. Please obtain BMP/CBC in one week    Discharge Condition: Stable CODE STATUS: FULL Diet recommendation: Heart Healthy / Carb Modified   Brief/Interim Summary: 66 year old female with a history of COPD, chronic respiratory failure on 2 L, diabetes mellitus, hypertension, diastolic CHF presented with 1 day history of shortness of breath and 1 week history of cough with white sputum.  The patient denied any fevers, chills, nausea, vomiting, diarrhea.  Oxygen saturation dropped to the 80s in the emergency department with ambulation.  Hemoglobin was noted to be 7.5.  Hemoccult was positive.  The patient also complained of left lower quadrant abdominal pain for which a CT of the abdomen and pelvis was performed on August 27, 2017.  The CT of the abdomen and pelvis revealed diverticulosis of the distended colon with mild acute inflammatory changes in the proximal sigmoid consistent with mild acute diverticulitis.  The patient was started on IV Solu-Medrol and bronchodilators.  Cipro and Flagyl was started for her diverticulitis.    Discharge Diagnoses:  Acute on chronic respiratory failure with hypoxia -Secondary COPD exacerbation -She has been weaned back to her home 2 L  COPD exacerbation -Continue Solu-Medrol>>>home with prednisone taper -Continue bronchodilators -Added Pulmicort  Chronic blood loss anemia/FOBT positive/Symptomatic Anemia -Source of bleeding is unclear, but pt with known NSAID use -Hemoglobin on Feb 12, 2017--12.1 -Presenting hemoglobin--7.5 -08/29/17 EGD--gastric fundal diverticulum, normal duodenum, small hiatus hernia -transfuse one unit PRBC-->Hgb 9.0 on day of  d/c -appreciate GI follow up -08/29/17--EGD--small hiatus hernia, gastric fundal diverticulum, normal duodenum.  SVT -pt had HR into 140s right after endoscopy 12/5--resolved with morphine, lopressor IV x 1 -Echo--EF 60-65%, no WMA, mild MR -TSH-0.85 -place on telemetry--no further SVT after starting metoprolol tartrate -start low dose metoprolol tartrate  Acute diverticulitis -Pain is improved, continue Cipro and Flagyl for now. -CT abdomen as discussed above -Discharge home with 6 additional days of Cipro and Flagyl to complete a 10-day course  CKD stage III -Baseline creatinine 1.2-1.5 -Holding ARB/diuretic -Serum creatinine 1.46 on the day of discharge  Elevated troponin -Flat, highest of 0.06. No chest pain. -EKG without acute ischemic changes. -Echo in May 2018 with preserved ejection fraction, do not believe further cardiac workup is indicated at this time.  Chronic diastolic heart failure -Echo in May 2018 with ejection fraction of 60-65% and grade 1 diastolic dysfunction. -compensated at present.  Diabetes mellitus type 2, controlled  -CBGs elevated secondary to steroids -Continue NovoLog sliding scale -Holding metformin and saxagliptin--restart saxagliptin only after discharge due to CKD3 (will not restart metformin)  Essential hypertension -Holding valsartan/HCTZ in the setting of CKD and recent malignancy association -Metoprolol tartrate and amlodipine started       Discharge Instructions  Discharge Instructions    Diet Carb Modified   Complete by:  As directed    Increase activity slowly   Complete by:  As directed      Allergies as of 08/30/2017   No Known Allergies     Medication List    STOP taking these medications   metFORMIN 500 MG 24 hr tablet Commonly known as:  GLUCOPHAGE-XR   naproxen sodium 220 MG tablet Commonly known as:  ALEVE   oxymorphone 5 MG 12 hr tablet Commonly  known as:  OPANA ER    valsartan-hydrochlorothiazide 160-12.5 MG tablet Commonly known as:  DIOVAN-HCT     TAKE these medications   amLODipine 5 MG tablet Commonly known as:  NORVASC Take 1 tablet (5 mg total) by mouth daily. Start taking on:  08/31/2017   aspirin 81 MG tablet Take 2 tablets by mouth daily.   atorvastatin 80 MG tablet Commonly known as:  LIPITOR Take 80 mg by mouth daily.   budesonide-formoterol 160-4.5 MCG/ACT inhaler Commonly known as:  SYMBICORT Inhale 2 puffs into the lungs 2 (two) times daily.   buPROPion 150 MG 12 hr tablet Commonly known as:  WELLBUTRIN SR Take 150 mg by mouth 2 (two) times daily.   ciprofloxacin 500 MG tablet Commonly known as:  CIPRO Take 1 tablet (500 mg total) by mouth 2 (two) times daily.   clopidogrel 75 MG tablet Commonly known as:  PLAVIX Take 75 mg by mouth daily.   famotidine 20 MG tablet Commonly known as:  PEPCID Take 20 mg by mouth 2 (two) times daily.   ipratropium-albuterol 0.5-2.5 (3) MG/3ML Soln Commonly known as:  DUONEB Take 3 mLs by nebulization every 6 (six) hours as needed (for shortness of breath).   LINZESS 145 MCG Caps capsule Generic drug:  linaclotide Take 145 mcg by mouth daily.   metoprolol tartrate 25 MG tablet Commonly known as:  LOPRESSOR Take 0.5 tablets (12.5 mg total) by mouth 2 (two) times daily.   metroNIDAZOLE 500 MG tablet Commonly known as:  FLAGYL Take 1 tablet (500 mg total) by mouth every 8 (eight) hours.   omega-3 acid ethyl esters 1 g capsule Commonly known as:  LOVAZA Take 2 g by mouth 2 (two) times daily.   omeprazole 20 MG capsule Commonly known as:  PRILOSEC Take 20 mg by mouth daily.   ONGLYZA 5 MG Tabs tablet Generic drug:  saxagliptin HCl Take 5 mg by mouth daily. Reported on 09/22/2015   OXYGEN Inhale 2 L into the lungs continuous.   predniSONE 10 MG tablet Commonly known as:  DELTASONE Take 6 tablets (60 mg total) by mouth daily with breakfast. And decrease by one tablet  daily Start taking on:  08/31/2017            Durable Medical Equipment  (From admission, onward)        Start     Ordered   08/28/17 1040  For home use only DME 4 wheeled rolling walker with seat  Once    Question:  Patient needs a walker to treat with the following condition  Answer:  COPD (chronic obstructive pulmonary disease) ([place])   08/28/17 1039      No Known Allergies  Consultations:  GI--[facility]   Procedures/Studies: Dg Chest 2 View  Result Date: 08/27/2017 CLINICAL DATA:  Progressive shortness of breath for 1 week. Chest pain. Productive cough. Chest congestion. COPD. EXAM: CHEST  2 VIEW COMPARISON:  02/11/2017 and 12/18/2016 FINDINGS: There is chronic interstitial and obstructive lung disease with pleural based calcification in the right hemithorax. No acute infiltrates or effusions. Mild cardiomegaly. Pulmonary vascularity is normal. Aortic atherosclerosis. By no discrete effusions. No significant bone abnormality. IMPRESSION: By 1. Chronic interstitial and obstructive lung disease without acute abnormality. 2. Mild cardiomegaly. 3.  Aortic Atherosclerosis (ICD10-I70.0). Electronically Signed   By: Lorriane Shire M.D.   On: 08/27/2017 13:32   Ct Abdomen Pelvis W Contrast  Result Date: 08/27/2017 CLINICAL DATA:  66 y/o female with shortness of Breath. Blood in  stool. EXAM: CT ABDOMEN AND PELVIS WITH CONTRAST TECHNIQUE: Multidetector CT imaging of the abdomen and pelvis was performed using the standard protocol following bolus administration of intravenous contrast. CONTRAST:  53mL ISOVUE-300 IOPAMIDOL (ISOVUE-300) INJECTION 61% COMPARISON:  CT Abdomen and Pelvis 09/18/2007. FINDINGS: Lower chest: Chronic cardiomegaly.  No pericardial effusion. Chronic bilateral lower lobe pleural thickening and small calcified pleural plaques. Superimposed chronic bilateral lung base architectural distortion which has progressed since 2008. Areas of honeycombing in the right lower lobe. No  right pleural effusion or definite acute pulmonary opacity. Hepatobiliary: The gallbladder is distended but there is no pericholecystic inflammation. No biliary ductal dilatation. Normal liver enhancement. Pancreas: Negative. Spleen: Negative. Adrenals/Urinary Tract: Normal adrenal glands. The right kidney has severely atrophied since 2008. The left kidney appears stable to mildly hypertrophied since the prior CT. There is normal appearing left renal contrast excretion on the delayed images. No right renal contrast excretion. Negative course of the left ureter. Unremarkable urinary bladder. Stomach/Bowel: Negative rectum. Mild to moderate diverticulosis in the sigmoid colon and descending colon. Best seen on coronal image 51 there is a small area of acute mesenteric stranding at the level of the proximal sigmoid colon. This is more subtle on the axial images. No other active inflammation identified in the region. Negative splenic flexure, transverse colon and right colon aside from retained stool. Appendix is diminutive or absent. Flocculated material in the mid and distal small bowel which otherwise appears normal. Oral contrast has not yet reached the distal small bowel. No dilated small bowel. Chronic juxta phrenic gastric diverticulum is stable (series 2, image 16). Otherwise negative stomach. Negative duodenum ; small chronic duodenum lipoma is unchanged (series 2, image 33). No abdominal free fluid or free air. Vascular/Lymphatic: Extensive Aortoiliac calcified atherosclerosis. Major arterial structures in the abdomen and pelvis are patent. Portal venous system is patent. No lymphadenopathy. Reproductive: Surgically absent. Other: No pelvic free fluid. Postoperative changes to the ventral low or abdominal wall appears stable. Musculoskeletal: No acute osseous abnormality identified. IMPRESSION: 1. Diverticulosis in the distal colon with mild acute inflammation at the proximal sigmoid compatible with mild acute  diverticulitis. No abscess or complicating features. 2. No other acute findings in the abdomen or pelvis. 3. Severe right renal atrophy since 2008. 4. Extensive Aortoiliac calcified atherosclerosis. 5. Chronic lung disease with lower lobe pleural plaques and developing pulmonary fibrosis. Electronically Signed   By: Genevie Ann M.D.   On: 08/27/2017 20:51        Discharge Exam: Vitals:   08/30/17 0723 08/30/17 0901  BP:  140/61  Pulse:    Resp:    Temp:    SpO2: 95%    Vitals:   08/29/17 2151 08/30/17 0641 08/30/17 0723 08/30/17 0901  BP: (!) 142/57 (!) 155/68  140/61  Pulse: (!) 107 78    Resp: 20 20    Temp: 98.4 F (36.9 C) 98.1 F (36.7 C)    TempSrc: Oral Oral    SpO2: 100% 99% 95%   Weight:      Height:        General: Pt is alert, awake, not in acute distress Cardiovascular: RRR, S1/S2 +, no rubs, no gallops Respiratory: CTA bilaterally, no wheezing, no rhonchi Abdominal: Soft, NT, ND, bowel sounds + Extremities: no edema, no cyanosis   The results of significant diagnostics from this hospitalization (including imaging, microbiology, ancillary and laboratory) are listed below for reference.    Significant Diagnostic Studies: Dg Chest 2 View  Result Date: 08/27/2017  CLINICAL DATA:  Progressive shortness of breath for 1 week. Chest pain. Productive cough. Chest congestion. COPD. EXAM: CHEST  2 VIEW COMPARISON:  02/11/2017 and 12/18/2016 FINDINGS: There is chronic interstitial and obstructive lung disease with pleural based calcification in the right hemithorax. No acute infiltrates or effusions. Mild cardiomegaly. Pulmonary vascularity is normal. Aortic atherosclerosis. By no discrete effusions. No significant bone abnormality. IMPRESSION: By 1. Chronic interstitial and obstructive lung disease without acute abnormality. 2. Mild cardiomegaly. 3.  Aortic Atherosclerosis (ICD10-I70.0). Electronically Signed   By: Lorriane Shire M.D.   On: 08/27/2017 13:32   Ct Abdomen  Pelvis W Contrast  Result Date: 08/27/2017 CLINICAL DATA:  66 y/o female with shortness of Breath. Blood in stool. EXAM: CT ABDOMEN AND PELVIS WITH CONTRAST TECHNIQUE: Multidetector CT imaging of the abdomen and pelvis was performed using the standard protocol following bolus administration of intravenous contrast. CONTRAST:  49mL ISOVUE-300 IOPAMIDOL (ISOVUE-300) INJECTION 61% COMPARISON:  CT Abdomen and Pelvis 09/18/2007. FINDINGS: Lower chest: Chronic cardiomegaly.  No pericardial effusion. Chronic bilateral lower lobe pleural thickening and small calcified pleural plaques. Superimposed chronic bilateral lung base architectural distortion which has progressed since 2008. Areas of honeycombing in the right lower lobe. No right pleural effusion or definite acute pulmonary opacity. Hepatobiliary: The gallbladder is distended but there is no pericholecystic inflammation. No biliary ductal dilatation. Normal liver enhancement. Pancreas: Negative. Spleen: Negative. Adrenals/Urinary Tract: Normal adrenal glands. The right kidney has severely atrophied since 2008. The left kidney appears stable to mildly hypertrophied since the prior CT. There is normal appearing left renal contrast excretion on the delayed images. No right renal contrast excretion. Negative course of the left ureter. Unremarkable urinary bladder. Stomach/Bowel: Negative rectum. Mild to moderate diverticulosis in the sigmoid colon and descending colon. Best seen on coronal image 51 there is a small area of acute mesenteric stranding at the level of the proximal sigmoid colon. This is more subtle on the axial images. No other active inflammation identified in the region. Negative splenic flexure, transverse colon and right colon aside from retained stool. Appendix is diminutive or absent. Flocculated material in the mid and distal small bowel which otherwise appears normal. Oral contrast has not yet reached the distal small bowel. No dilated small  bowel. Chronic juxta phrenic gastric diverticulum is stable (series 2, image 16). Otherwise negative stomach. Negative duodenum ; small chronic duodenum lipoma is unchanged (series 2, image 33). No abdominal free fluid or free air. Vascular/Lymphatic: Extensive Aortoiliac calcified atherosclerosis. Major arterial structures in the abdomen and pelvis are patent. Portal venous system is patent. No lymphadenopathy. Reproductive: Surgically absent. Other: No pelvic free fluid. Postoperative changes to the ventral low or abdominal wall appears stable. Musculoskeletal: No acute osseous abnormality identified. IMPRESSION: 1. Diverticulosis in the distal colon with mild acute inflammation at the proximal sigmoid compatible with mild acute diverticulitis. No abscess or complicating features. 2. No other acute findings in the abdomen or pelvis. 3. Severe right renal atrophy since 2008. 4. Extensive Aortoiliac calcified atherosclerosis. 5. Chronic lung disease with lower lobe pleural plaques and developing pulmonary fibrosis. Electronically Signed   By: Genevie Ann M.D.   On: 08/27/2017 20:51     Microbiology: No results found for this or any previous visit (from the past 240 hour(s)).   Labs: Basic Metabolic Panel: Recent Labs  Lab 08/27/17 1628 08/28/17 0436 08/29/17 0502 08/30/17 0421  NA 140 138 139 139  K 4.1 4.6 5.0 5.0  CL 104 104 103 105  CO2 28  28 29 29   GLUCOSE 112* 159* 154* 150*  BUN 25* 22* 23* 22*  CREATININE 1.62* 1.33* 1.41* 1.46*  CALCIUM 8.8* 8.9 9.0 8.6*   Liver Function Tests: No results for input(s): AST, ALT, ALKPHOS, BILITOT, PROT, ALBUMIN in the last 168 hours. No results for input(s): LIPASE, AMYLASE in the last 168 hours. No results for input(s): AMMONIA in the last 168 hours. CBC: Recent Labs  Lab 08/27/17 1628 08/28/17 0436 08/29/17 0502 08/30/17 0421  WBC 8.4 6.1 8.9 8.9  NEUTROABS 5.4  --   --   --   HGB 7.5* 7.1* 7.2* 9.0*  HCT 25.3* 23.9* 23.6* 29.6*  MCV 76.7*  75.9* 75.9* 79.6  PLT 277 297 307 303   Cardiac Enzymes: Recent Labs  Lab 08/27/17 1628 08/27/17 2249 08/28/17 0436 08/28/17 0958  TROPONINI 0.05* 0.06* 0.05* 0.04*   BNP: Invalid input(s): POCBNP CBG: Recent Labs  Lab 08/29/17 1206 08/29/17 1640 08/29/17 2149 08/30/17 0827 08/30/17 1203  GLUCAP 115* 124* 113* 167* 88    Time coordinating discharge:  Greater than 30 minutes  Signed:  Orson Eva, DO [facility] Pager: [phone] 08/30/2017, 1:32 PM

## 2017-08-29 NOTE — Progress Notes (Signed)
*  PRELIMINARY RESULTS* Echocardiogram 2D Echocardiogram has been performed.  Erica [name] 08/29/2017, 2:58 PM

## 2017-08-29 NOTE — Anesthesia Preprocedure Evaluation (Signed)
Anesthesia Evaluation  Patient identified by MRN, date of birth, ID band Patient awake    Airway Mallampati: I  TM Distance: >3 FB     Dental no notable dental hx. (+) Edentulous Upper, Edentulous Lower   Pulmonary COPD,  COPD inhaler, Current Smoker,    Pulmonary exam normal        Cardiovascular hypertension, Pt. on medications +CHF   Rhythm:Regular Rate:Normal   Systolic function was normal.   The estimated ejection fraction was in the range of 60% to 65%.   Wall motion was normal; there were no regional wall motion   abnormalities.     Neuro/Psych    GI/Hepatic GERD  Medicated and Controlled,  Endo/Other  diabetes, Well Controlled, Type 2  Renal/GU      Musculoskeletal  (+) Arthritis ,   Abdominal Normal abdominal exam  (+)  Abdomen: soft.    Peds  Hematology  (+) anemia ,   Anesthesia Other Findings   Reproductive/Obstetrics                            Anesthesia Physical Anesthesia Plan  ASA: IV and emergent  Anesthesia Plan: MAC   Post-op Pain Management:    Induction:   PONV Risk Score and Plan:   Airway Management Planned: Nasal Cannula  Additional Equipment:   Intra-op Plan:   Post-operative Plan:   Informed Consent: I have reviewed the patients History and Physical, chart, labs and discussed the procedure including the risks, benefits and alternatives for the proposed anesthesia with the patient or authorized representative who has indicated his/her understanding and acceptance.     Plan Discussed with: CRNA and Surgeon  Anesthesia Plan Comments:         Anesthesia Quick Evaluation

## 2017-08-29 NOTE — Evaluation (Signed)
Patient briefly had 4/10 chest discomfort after her EGD, HR at this time was 135.  Patient was placed on Oxygen facemask, and give 2 mg Lopressor.  12-lead ECG was obtained (non specific). HR reduced to 88.  CP was relieved to 0/10.  .  2 mg Morphine was given about the same time.  Will speak to Dr. Gala Romney about further evaluation.  The patient's Hgb was in the mid 7's at this time.

## 2017-08-29 NOTE — Addendum Note (Signed)
Addendum  created 08/29/17 1217 by Mikey College, MD   Sign clinical note

## 2017-08-30 ENCOUNTER — Telehealth: Payer: Self-pay | Admitting: Gastroenterology

## 2017-08-30 ENCOUNTER — Encounter: Payer: Self-pay | Admitting: Gastroenterology

## 2017-08-30 ENCOUNTER — Encounter (HOSPITAL_COMMUNITY): Payer: Self-pay | Admitting: Internal Medicine

## 2017-08-30 DIAGNOSIS — D500 Iron deficiency anemia secondary to blood loss (chronic): Secondary | ICD-10-CM

## 2017-08-30 DIAGNOSIS — K5792 Diverticulitis of intestine, part unspecified, without perforation or abscess without bleeding: Secondary | ICD-10-CM

## 2017-08-30 DIAGNOSIS — Z720 Tobacco use: Secondary | ICD-10-CM

## 2017-08-30 LAB — GLUCOSE, CAPILLARY
GLUCOSE-CAPILLARY: 88 mg/dL (ref 65–99)
Glucose-Capillary: 167 mg/dL — ABNORMAL HIGH (ref 65–99)

## 2017-08-30 LAB — BASIC METABOLIC PANEL
ANION GAP: 5 (ref 5–15)
BUN: 22 mg/dL — ABNORMAL HIGH (ref 6–20)
CALCIUM: 8.6 mg/dL — AB (ref 8.9–10.3)
CHLORIDE: 105 mmol/L (ref 101–111)
CO2: 29 mmol/L (ref 22–32)
Creatinine, Ser: 1.46 mg/dL — ABNORMAL HIGH (ref 0.44–1.00)
GFR calc non Af Amer: 36 mL/min — ABNORMAL LOW (ref >60)
GFR, EST AFRICAN AMERICAN: 41 mL/min — AB (ref >60)
Glucose, Bld: 150 mg/dL — ABNORMAL HIGH (ref 65–99)
Potassium: 5 mmol/L (ref 3.5–5.1)
SODIUM: 139 mmol/L (ref 135–145)

## 2017-08-30 LAB — CBC
HCT: 29.6 % — ABNORMAL LOW (ref 36.0–46.0)
HEMOGLOBIN: 9 g/dL — AB (ref 12.0–15.0)
MCH: 24.2 pg — AB (ref 26.0–34.0)
MCHC: 30.4 g/dL (ref 30.0–36.0)
MCV: 79.6 fL (ref 78.0–100.0)
PLATELETS: 303 10*3/uL (ref 150–400)
RBC: 3.72 MIL/uL — AB (ref 3.87–5.11)
RDW: 18 % — ABNORMAL HIGH (ref 11.5–15.5)
WBC: 8.9 10*3/uL (ref 4.0–10.5)

## 2017-08-30 LAB — TYPE AND SCREEN
ABO/RH(D): B POS
Antibody Screen: NEGATIVE
Unit division: 0

## 2017-08-30 LAB — BPAM RBC
Blood Product Expiration Date: 201812242359
ISSUE DATE / TIME: 201812051625
Unit Type and Rh: 5100

## 2017-08-30 LAB — TSH: TSH: 0.885 u[IU]/mL (ref 0.350–4.500)

## 2017-08-30 MED ORDER — METOPROLOL TARTRATE 25 MG PO TABS: 12.5000 mg | ORAL_TABLET | Freq: Two times a day (BID) | ORAL | 1 refills | Status: DC

## 2017-08-30 MED ORDER — CIPROFLOXACIN HCL 250 MG PO TABS: 500.0000 mg | ORAL_TABLET | Freq: Two times a day (BID) | ORAL | Status: DC

## 2017-08-30 MED ORDER — AMLODIPINE BESYLATE 5 MG PO TABS: 5.0000 mg | ORAL_TABLET | Freq: Every day | ORAL | 1 refills | Status: DC

## 2017-08-30 MED ORDER — PREDNISONE 10 MG PO TABS: 60.0000 mg | ORAL_TABLET | Freq: Every day | ORAL | 0 refills | Status: DC

## 2017-08-30 MED ORDER — PREDNISONE 20 MG PO TABS: 60.0000 mg | ORAL_TABLET | Freq: Every day | ORAL | Status: DC

## 2017-08-30 MED ORDER — CIPROFLOXACIN HCL 500 MG PO TABS: 500.0000 mg | ORAL_TABLET | Freq: Two times a day (BID) | ORAL | 0 refills | Status: DC

## 2017-08-30 MED ORDER — METRONIDAZOLE 500 MG PO TABS: 500.0000 mg | ORAL_TABLET | Freq: Three times a day (TID) | ORAL | 0 refills | Status: DC

## 2017-08-30 MED ORDER — METRONIDAZOLE 500 MG PO TABS: 500.0000 mg | ORAL_TABLET | Freq: Three times a day (TID) | ORAL | Status: DC

## 2017-08-30 NOTE — Progress Notes (Signed)
Pt's IV catheter removed and intact. Pt's IV site clean dry and intact. Discharge instructions including medications were reviewed and discussed with patient. All questions were answered and no further questions at this time. Pt in stable condition and in no acute distress at time of discharge. Pt escorted by nurse tech.

## 2017-08-30 NOTE — Telephone Encounter (Signed)
PATIENT SCHEDULED  °

## 2017-08-30 NOTE — Telephone Encounter (Signed)
Hospital follow up in 3-4 weeks. May use urgent. Will need colonoscopy scheduled at that time.

## 2017-08-30 NOTE — Care Management Important Message (Signed)
Important Message  Patient Details  Name: Erica [name] MRN: [id number] Date of Birth: Oct 11, 1950   Medicare Important Message Given:  Yes    Sherald Barge, RN 08/30/2017, 1:11 PM

## 2017-08-30 NOTE — Progress Notes (Signed)
Subjective:  Patient denies abd pain. Wants to go home.   Objective: Vital signs in last 24 hours: Temp:  [97.9 F (36.6 C)-98.4 F (36.9 C)] 98.1 F (36.7 C) (12/06 0641) Pulse Rate:  [78-109] 78 (12/06 0641) Resp:  [14-30] 20 (12/06 0641) BP: (132-170)/(57-82) 155/68 (12/06 0641) SpO2:  [89 %-100 %] 95 % (12/06 0723) Last BM Date: 08/26/17 General:   Alert,  Well-developed, well-nourished, pleasant and cooperative in NAD Head:  Normocephalic and atraumatic. Eyes:  Sclera clear, no icterus.  Abdomen:  Soft, nontender and nondistended.   Normal bowel sounds, without guarding, and without rebound.   Extremities:  Without clubbing, deformity or edema. Neurologic:  Alert and  oriented x4;  grossly normal neurologically. Skin:  Intact without significant lesions or rashes. Psych:  Alert and cooperative. Normal mood and affect.  Intake/Output from previous day: 12/05 0701 - 12/06 0700 In: 2475.8 [I.V.:1228.3; Blood:347.5; IV Piggyback:900] Out: -  Intake/Output this shift: No intake/output data recorded.  Lab Results: CBC Recent Labs    08/28/17 0436 08/29/17 0502 08/30/17 0421  WBC 6.1 8.9 8.9  HGB 7.1* 7.2* 9.0*  HCT 23.9* 23.6* 29.6*  MCV 75.9* 75.9* 79.6  PLT 297 307 303   BMET Recent Labs    08/28/17 0436 08/29/17 0502 08/30/17 0421  NA 138 139 139  K 4.6 5.0 5.0  CL 104 103 105  CO2 28 29 29   GLUCOSE 159* 154* 150*  BUN 22* 23* 22*  CREATININE 1.33* 1.41* 1.46*  CALCIUM 8.9 9.0 8.6*   LFTs No results for input(s): BILITOT, BILIDIR, IBILI, ALKPHOS, AST, ALT, PROT, ALBUMIN in the last 72 hours. No results for input(s): LIPASE in the last 72 hours. PT/INR No results for input(s): LABPROT, INR in the last 72 hours.    Imaging Studies: Dg Chest 2 View  Result Date: 08/27/2017 CLINICAL DATA:  Progressive shortness of breath for 1 week. Chest pain. Productive cough. Chest congestion. COPD. EXAM: CHEST  2 VIEW COMPARISON:  02/11/2017 and  12/18/2016 FINDINGS: There is chronic interstitial and obstructive lung disease with pleural based calcification in the right hemithorax. No acute infiltrates or effusions. Mild cardiomegaly. Pulmonary vascularity is normal. Aortic atherosclerosis. By no discrete effusions. No significant bone abnormality. IMPRESSION: By 1. Chronic interstitial and obstructive lung disease without acute abnormality. 2. Mild cardiomegaly. 3.  Aortic Atherosclerosis (ICD10-I70.0). Electronically Signed   By: Lorriane Shire M.D.   On: 08/27/2017 13:32   Ct Abdomen Pelvis W Contrast  Result Date: 08/27/2017 CLINICAL DATA:  66 y/o female with shortness of Breath. Blood in stool. EXAM: CT ABDOMEN AND PELVIS WITH CONTRAST TECHNIQUE: Multidetector CT imaging of the abdomen and pelvis was performed using the standard protocol following bolus administration of intravenous contrast. CONTRAST:  33mL ISOVUE-300 IOPAMIDOL (ISOVUE-300) INJECTION 61% COMPARISON:  CT Abdomen and Pelvis 09/18/2007. FINDINGS: Lower chest: Chronic cardiomegaly.  No pericardial effusion. Chronic bilateral lower lobe pleural thickening and small calcified pleural plaques. Superimposed chronic bilateral lung base architectural distortion which has progressed since 2008. Areas of honeycombing in the right lower lobe. No right pleural effusion or definite acute pulmonary opacity. Hepatobiliary: The gallbladder is distended but there is no pericholecystic inflammation. No biliary ductal dilatation. Normal liver enhancement. Pancreas: Negative. Spleen: Negative. Adrenals/Urinary Tract: Normal adrenal glands. The right kidney has severely atrophied since 2008. The left kidney appears stable to mildly hypertrophied since the prior CT. There is normal appearing left renal contrast excretion on the delayed images. No right renal contrast  excretion. Negative course of the left ureter. Unremarkable urinary bladder. Stomach/Bowel: Negative rectum. Mild to moderate diverticulosis  in the sigmoid colon and descending colon. Best seen on coronal image 51 there is a small area of acute mesenteric stranding at the level of the proximal sigmoid colon. This is more subtle on the axial images. No other active inflammation identified in the region. Negative splenic flexure, transverse colon and right colon aside from retained stool. Appendix is diminutive or absent. Flocculated material in the mid and distal small bowel which otherwise appears normal. Oral contrast has not yet reached the distal small bowel. No dilated small bowel. Chronic juxta phrenic gastric diverticulum is stable (series 2, image 16). Otherwise negative stomach. Negative duodenum ; small chronic duodenum lipoma is unchanged (series 2, image 33). No abdominal free fluid or free air. Vascular/Lymphatic: Extensive Aortoiliac calcified atherosclerosis. Major arterial structures in the abdomen and pelvis are patent. Portal venous system is patent. No lymphadenopathy. Reproductive: Surgically absent. Other: No pelvic free fluid. Postoperative changes to the ventral low or abdominal wall appears stable. Musculoskeletal: No acute osseous abnormality identified. IMPRESSION: 1. Diverticulosis in the distal colon with mild acute inflammation at the proximal sigmoid compatible with mild acute diverticulitis. No abscess or complicating features. 2. No other acute findings in the abdomen or pelvis. 3. Severe right renal atrophy since 2008. 4. Extensive Aortoiliac calcified atherosclerosis. 5. Chronic lung disease with lower lobe pleural plaques and developing pulmonary fibrosis. Electronically Signed   By: Genevie Ann M.D.   On: 08/27/2017 20:51  [2 weeks]   Assessment: 66 year old female with acute on chronic respiratory failure/COPD exacerbation in the setting of new on no overt GI bleeding but she was heme positive in the ED.  Consumes large amounts of ibuprofen.  Epigastric pain for about a month.  Found to have mild acute sigmoid  diverticulitis via CT imaging this admission.  Microcytic anemia, new onset, with normal hemoglobin 6 months ago.  Last colonoscopy 2009 with hyperplastic polyps.  Patient received 1 unit of blood this admission.  EGD this admission with small hiatal hernia, gastric fundal diverticulum.  Nothing to explain epigastric pain or bleeding.  Question dyspepsia in the setting of recent NSAID use.  Plan: 1. Complete full course of Cipro and Flagyl. 2. Advance to low residue diet for a few days and transition to high-fiber diet once diverticulitis treated. 3. Continue PPI daily. 4. Colonoscopy as an outpatient in a few weeks.  We will make arrangements.  Laureen Ochs. Bernarda Caffey [facility] [phone] 12/6/20188:50 AM      LOS: 3 days

## 2017-09-15 DIAGNOSIS — J9610 Chronic respiratory failure, unspecified whether with hypoxia or hypercapnia: Secondary | ICD-10-CM | POA: Diagnosis not present

## 2017-09-15 DIAGNOSIS — J441 Chronic obstructive pulmonary disease with (acute) exacerbation: Secondary | ICD-10-CM | POA: Diagnosis not present

## 2017-09-19 ENCOUNTER — Emergency Department (HOSPITAL_COMMUNITY): Payer: Medicare Other

## 2017-09-19 ENCOUNTER — Other Ambulatory Visit: Payer: Self-pay

## 2017-09-19 ENCOUNTER — Encounter (HOSPITAL_COMMUNITY): Payer: Self-pay | Admitting: *Deleted

## 2017-09-19 ENCOUNTER — Inpatient Hospital Stay (HOSPITAL_COMMUNITY)
Admission: EM | Admit: 2017-09-19 | Discharge: 2017-09-19 | DRG: 291 | Disposition: A | Discharge status: Home / Self Care | Payer: Medicare Other | Attending: Family Medicine | Admitting: Family Medicine

## 2017-09-19 DIAGNOSIS — I5030 Unspecified diastolic (congestive) heart failure: Secondary | ICD-10-CM

## 2017-09-19 DIAGNOSIS — Z720 Tobacco use: Secondary | ICD-10-CM

## 2017-09-19 DIAGNOSIS — J9621 Acute and chronic respiratory failure with hypoxia: Secondary | ICD-10-CM

## 2017-09-19 DIAGNOSIS — E1165 Type 2 diabetes mellitus with hyperglycemia: Secondary | ICD-10-CM | POA: Diagnosis not present

## 2017-09-19 DIAGNOSIS — J9620 Acute and chronic respiratory failure, unspecified whether with hypoxia or hypercapnia: Secondary | ICD-10-CM | POA: Diagnosis present

## 2017-09-19 DIAGNOSIS — J810 Acute pulmonary edema: Secondary | ICD-10-CM | POA: Diagnosis not present

## 2017-09-19 DIAGNOSIS — Z90710 Acquired absence of both cervix and uterus: Secondary | ICD-10-CM

## 2017-09-19 DIAGNOSIS — I110 Hypertensive heart disease with heart failure: Secondary | ICD-10-CM | POA: Diagnosis not present

## 2017-09-19 DIAGNOSIS — Z9981 Dependence on supplemental oxygen: Secondary | ICD-10-CM

## 2017-09-19 DIAGNOSIS — D500 Iron deficiency anemia secondary to blood loss (chronic): Secondary | ICD-10-CM | POA: Diagnosis not present

## 2017-09-19 DIAGNOSIS — Z7982 Long term (current) use of aspirin: Secondary | ICD-10-CM | POA: Diagnosis not present

## 2017-09-19 DIAGNOSIS — J9610 Chronic respiratory failure, unspecified whether with hypoxia or hypercapnia: Secondary | ICD-10-CM | POA: Diagnosis present

## 2017-09-19 DIAGNOSIS — E119 Type 2 diabetes mellitus without complications: Secondary | ICD-10-CM

## 2017-09-19 DIAGNOSIS — G894 Chronic pain syndrome: Secondary | ICD-10-CM | POA: Diagnosis present

## 2017-09-19 DIAGNOSIS — I5031 Acute diastolic (congestive) heart failure: Secondary | ICD-10-CM | POA: Diagnosis not present

## 2017-09-19 DIAGNOSIS — R0602 Shortness of breath: Secondary | ICD-10-CM

## 2017-09-19 DIAGNOSIS — I5033 Acute on chronic diastolic (congestive) heart failure: Secondary | ICD-10-CM

## 2017-09-19 DIAGNOSIS — F17210 Nicotine dependence, cigarettes, uncomplicated: Secondary | ICD-10-CM | POA: Diagnosis not present

## 2017-09-19 DIAGNOSIS — E1122 Type 2 diabetes mellitus with diabetic chronic kidney disease: Secondary | ICD-10-CM | POA: Diagnosis not present

## 2017-09-19 DIAGNOSIS — J441 Chronic obstructive pulmonary disease with (acute) exacerbation: Secondary | ICD-10-CM | POA: Diagnosis not present

## 2017-09-19 DIAGNOSIS — R0600 Dyspnea, unspecified: Secondary | ICD-10-CM | POA: Diagnosis present

## 2017-09-19 DIAGNOSIS — K219 Gastro-esophageal reflux disease without esophagitis: Secondary | ICD-10-CM | POA: Diagnosis present

## 2017-09-19 DIAGNOSIS — I10 Essential (primary) hypertension: Secondary | ICD-10-CM | POA: Diagnosis present

## 2017-09-19 DIAGNOSIS — J9611 Chronic respiratory failure with hypoxia: Secondary | ICD-10-CM | POA: Diagnosis present

## 2017-09-19 DIAGNOSIS — Z79899 Other long term (current) drug therapy: Secondary | ICD-10-CM

## 2017-09-19 LAB — COMPREHENSIVE METABOLIC PANEL
ALBUMIN: 3.3 g/dL — AB (ref 3.5–5.0)
ALT: 10 U/L — ABNORMAL LOW (ref 14–54)
ANION GAP: 11 (ref 5–15)
AST: 13 U/L — ABNORMAL LOW (ref 15–41)
Alkaline Phosphatase: 69 U/L (ref 38–126)
BILIRUBIN TOTAL: 0.2 mg/dL — AB (ref 0.3–1.2)
BUN: 13 mg/dL (ref 6–20)
CHLORIDE: 105 mmol/L (ref 101–111)
CO2: 25 mmol/L (ref 22–32)
Calcium: 8.7 mg/dL — ABNORMAL LOW (ref 8.9–10.3)
Creatinine, Ser: 1.17 mg/dL — ABNORMAL HIGH (ref 0.44–1.00)
GFR calc Af Amer: 54 mL/min — ABNORMAL LOW (ref >60)
GFR, EST NON AFRICAN AMERICAN: 47 mL/min — AB (ref >60)
Glucose, Bld: 126 mg/dL — ABNORMAL HIGH (ref 65–99)
POTASSIUM: 4.1 mmol/L (ref 3.5–5.1)
Sodium: 141 mmol/L (ref 135–145)
TOTAL PROTEIN: 6.5 g/dL (ref 6.5–8.1)

## 2017-09-19 LAB — CBC WITH DIFFERENTIAL/PLATELET
BASOS PCT: 0 %
Basophils Absolute: 0 10*3/uL (ref 0.0–0.1)
EOS PCT: 4 %
Eosinophils Absolute: 0.2 10*3/uL (ref 0.0–0.7)
HEMATOCRIT: 29.1 % — AB (ref 36.0–46.0)
Hemoglobin: 8.7 g/dL — ABNORMAL LOW (ref 12.0–15.0)
Lymphocytes Relative: 24 %
Lymphs Abs: 1.3 10*3/uL (ref 0.7–4.0)
MCH: 23.4 pg — ABNORMAL LOW (ref 26.0–34.0)
MCHC: 29.9 g/dL — AB (ref 30.0–36.0)
MCV: 78.2 fL (ref 78.0–100.0)
MONO ABS: 0.4 10*3/uL (ref 0.1–1.0)
MONOS PCT: 8 %
NEUTROS ABS: 3.6 10*3/uL (ref 1.7–7.7)
Neutrophils Relative %: 64 %
PLATELETS: 213 10*3/uL (ref 150–400)
RBC: 3.72 MIL/uL — ABNORMAL LOW (ref 3.87–5.11)
RDW: 18.1 % — AB (ref 11.5–15.5)
WBC: 5.6 10*3/uL (ref 4.0–10.5)

## 2017-09-19 LAB — TROPONIN I

## 2017-09-19 LAB — LACTIC ACID, PLASMA: LACTIC ACID, VENOUS: 1.2 mmol/L (ref 0.5–1.9)

## 2017-09-19 LAB — BRAIN NATRIURETIC PEPTIDE: B Natriuretic Peptide: 244 pg/mL — ABNORMAL HIGH (ref 0.0–100.0)

## 2017-09-19 MED ORDER — ALBUTEROL (5 MG/ML) CONTINUOUS INHALATION SOLN
10.0000 mg/h | INHALATION_SOLUTION | RESPIRATORY_TRACT | Status: AC
  Administered 2017-09-19: 10 mg/h via RESPIRATORY_TRACT
  Filled 2017-09-19: qty 20

## 2017-09-19 MED ORDER — DEXTROSE 5 % IV SOLN
1.0000 g | Freq: Once | INTRAVENOUS | Status: AC
  Administered 2017-09-19: 1 g via INTRAVENOUS
  Filled 2017-09-19: qty 10

## 2017-09-19 MED ORDER — METHYLPREDNISOLONE SODIUM SUCC 125 MG IJ SOLR
125.0000 mg | Freq: Once | INTRAMUSCULAR | Status: AC
  Administered 2017-09-19: 125 mg via INTRAVENOUS
  Filled 2017-09-19: qty 2

## 2017-09-19 MED ORDER — ALBUTEROL (5 MG/ML) CONTINUOUS INHALATION SOLN: 10.0000 mg/h | INHALATION_SOLUTION | RESPIRATORY_TRACT | Status: AC

## 2017-09-19 MED ORDER — DEXTROSE 5 % IV SOLN
500.0000 mg | Freq: Once | INTRAVENOUS | Status: AC
  Administered 2017-09-19: 500 mg via INTRAVENOUS
  Filled 2017-09-19: qty 500

## 2017-09-19 MED ORDER — SODIUM CHLORIDE 0.9 % IV SOLN
1000.0000 mL | INTRAVENOUS | Status: DC
  Administered 2017-09-19: 1000 mL via INTRAVENOUS

## 2017-09-19 MED ORDER — AMOXICILLIN-POT CLAVULANATE 875-125 MG PO TABS: 1.0000 | ORAL_TABLET | Freq: Two times a day (BID) | ORAL | 0 refills | Status: DC

## 2017-09-19 MED ORDER — FUROSEMIDE 10 MG/ML IJ SOLN
40.0000 mg | INTRAMUSCULAR | Status: AC
  Administered 2017-09-19: 40 mg via INTRAVENOUS
  Filled 2017-09-19: qty 4

## 2017-09-19 NOTE — Progress Notes (Signed)
09/19/2017 6:07 PM  Pt refuses admission to hospital.  I counseled her on the risks and she verbalized understanding.  I let the ED providers know that patient refused admission and will need to be discharged home.   Murvin Natal MD

## 2017-09-19 NOTE — Consult Note (Signed)
[facility] Consult Note  Erica [name] OXB:[id number],AST:[id number]  Patients out patient PCP is Iona Beard, MD Consult requested in the Hospital by Murlean Iba, MD, On 09/19/2017  Reason for consult:  Shortness of breath   With History of - Principal Problem:   Acute on chronic diastolic CHF (congestive heart failure) ([place]) Active Problems:   COPD exacerbation ([place])   Chronic pain syndrome   Hypertension   Diabetes mellitus type 2, controlled ([place])   Chronic respiratory failure ([place])   GERD (gastroesophageal reflux disease)   Tobacco abuse   Type 2 diabetes mellitus with hyperglycemia (HCC)   Acute on chronic respiratory failure (HCC)   Acute dyspnea   (HFpEF) heart failure with preserved ejection fraction (HCC)   Iron deficiency anemia due to chronic blood loss   Acute diastolic CHF (congestive heart failure) ([place])   Past Medical History:  Diagnosis Date  . Arthritis   . CHF (congestive heart failure) ([place])   . Chronic back pain   . COPD (chronic obstructive pulmonary disease) (HCC)    2L home O2  . Diabetes mellitus without complication ([place])   . Hypertension      Past Surgical History:  Procedure Laterality Date  . ABDOMINAL HYSTERECTOMY    . colonoscopy  2009   Dr. Gala Romney: cluster of diminutive rectal polyps and 2 diminutive polyps in rectosigmoid junction, hyperplastic  . ESOPHAGOGASTRODUODENOSCOPY (EGD) WITH PROPOFOL N/A 08/29/2017   Procedure: ESOPHAGOGASTRODUODENOSCOPY (EGD) WITH PROPOFOL;  Surgeon: Daneil Dolin, MD;  [facility];  Service: Gastroenterology;  Laterality: N/A;  . left hand middle finger     in an accident at work, amputated    Past Surgical History:  Procedure Laterality Date  . ABDOMINAL HYSTERECTOMY    . colonoscopy  2009   Dr. Gala Romney: cluster of diminutive rectal polyps and 2 diminutive polyps in rectosigmoid junction, hyperplastic  . ESOPHAGOGASTRODUODENOSCOPY (EGD) WITH PROPOFOL N/A 08/29/2017   Procedure: ESOPHAGOGASTRODUODENOSCOPY (EGD) WITH PROPOFOL;  Surgeon: Daneil Dolin, MD;  Location: [facility];  Service: Gastroenterology;  Laterality: N/A;  . left hand middle finger     in an accident at work, amputated    HPI:-  Erica [name] CSN:[id number],MRN:[id number] is a 66 y.o. female with chronic diastolic heart failure, COPD, ongoing tobacco abuse who was recently discharged from the hospital with an admission for acute diastolic heart failure exacerbation and diverticulitis.  She presented to the emergency department today with 2-3 days of progressive shortness of breath and slight edema in the lower extremities.  She denied chest pain.  She reports that she does not take Lasix but has been taking aspirin.  She has been working with her diet to avoid salt intake.  She did have a recent echocardiogram approximately 3 weeks ago that showed a normal ejection fraction with some diastolic dysfunction and some mild mitral regurgitation.  In the emergency department she was evaluated and noted to have some mild pulmonary edema and they were some concern that she may have an atypical pneumonia.  She was treated with IV ceftriaxone and azithromycin and given IV furosemide.  She was also given a nebulizer treatment.  She reports that she is feeling much better after these treatments.  She was offered hospital admission but she has adamantly and repeatedly declined admission.  Review of Systems   As noted in HPI, otherwise all reviewed and reported negative.   Social History Social History   Tobacco Use  . Smoking status: Current Every Day Smoker  Packs/day: 1.00    Years: 52.00    Pack years: 52.00    Types: Cigarettes  . Smokeless tobacco: Never Used  Substance Use Topics  . Alcohol use: No   Family History Family History  Problem Relation Age of Onset  . CVA Mother 28  . Cancer Neg Hx   . Colon cancer Neg Hx      Prior to Admission medications   Medication Sig Start Date  End Date Taking? Authorizing Provider  amLODipine (NORVASC) 5 MG tablet Take 1 tablet (5 mg total) by mouth daily. 08/31/17  Yes TatShanon Brow, MD  aspirin 81 MG tablet Take 2 tablets by mouth daily.  10/22/15  Yes [provider]  atorvastatin (LIPITOR) 80 MG tablet Take 80 mg by mouth daily.  12/04/16  Yes [provider]  budesonide-formoterol (SYMBICORT) 160-4.5 MCG/ACT inhaler Inhale 2 puffs into the lungs 2 (two) times daily. 11/23/15  Yes Barton Dubois, MD  buPROPion [facility] SR) 150 MG 12 hr tablet Take 150 mg by mouth 2 (two) times daily. 12/04/16  Yes [provider]  clopidogrel (PLAVIX) 75 MG tablet Take 75 mg by mouth daily.   Yes [provider]  famotidine (PEPCID) 20 MG tablet Take 20 mg by mouth 2 (two) times daily.   Yes [provider]  ipratropium-albuterol (DUONEB) 0.5-2.5 (3) MG/3ML SOLN Take 3 mLs by nebulization every 6 (six) hours as needed (for shortness of breath).   Yes [provider]  Linaclotide (LINZESS) 145 MCG CAPS capsule Take 145 mcg by mouth daily.   Yes [provider]  metoprolol tartrate (LOPRESSOR) 25 MG tablet Take 0.5 tablets (12.5 mg total) by mouth 2 (two) times daily. 08/30/17  Yes Tat, Shanon Brow, MD  omega-3 acid ethyl esters (LOVAZA) 1 g capsule Take 2 g by mouth 2 (two) times daily.  12/07/16  Yes [provider]  omeprazole (PRILOSEC) 20 MG capsule Take 20 mg by mouth daily. 12/04/16  Yes [provider]  OXYGEN Inhale 2 L into the lungs continuous.   Yes [provider]  saxagliptin HCl (ONGLYZA) 5 MG TABS tablet Take 5 mg by mouth daily. Reported on 09/22/2015   Yes [provider]  metroNIDAZOLE (FLAGYL) 500 MG tablet Take 1 tablet (500 mg total) by mouth every 8 (eight) hours. Patient not taking: Reported on 09/19/2017 08/30/17   Orson Eva, MD    No Known Allergies   Physical Exam No intake or output data in the 24 hours ending 09/19/17 1749 Blood  pressure (!) 136/96, pulse 69, temperature 98.1 F (36.7 C), resp. rate (!) 24, height 5\' 4"  (1.626 m), SpO2 100 %. Constitutional: She appears well-developed and well-nourished. She is sitting up in bed in no distress. Cooperative.  HENT: Head: Normocephalic and atraumatic.  Mouth/Throat: Oropharynx is clear and moist. No oropharyngeal exudate.  Eyes: Conjunctivae and EOM are normal. Pupils are equal, round, and reactive to light.   Neck: Normal range of motion. Neck supple. No JVD present. No thyromegaly present.  Cardiovascular: Normal rate, regular rhythm, normal heart sounds and intact distal pulses. Exam reveals no gallop and no friction rub. No murmur heard. Pulmonary/Chest: Lungs are fairly clear bilateral with rare basilar crackles heard but no wheezing. She exhibits no tenderness.  Abdominal: Soft. Bowel sounds are normal. She exhibits no distension and no mass. There is no tenderness.  Musculoskeletal: Normal range of motion. She exhibits mild nonpitting bilateral symmetric pretibial edema. She exhibits no tenderness.  Lymphadenopathy:  She has  no cervical adenopathy.  Neurological: She is alert. Coordination normal.  Skin: Skin is warm and dry. No rash noted. No erythema.  Psychiatric: She has a normal mood and affect. Her behavior is normal.   Data Review CBC w Diff:  Lab Results  Component Value Date   WBC 5.6 09/19/2017   HGB 8.7 (L) 09/19/2017   HCT 29.1 (L) 09/19/2017   PLT 213 09/19/2017   LYMPHOPCT 24 09/19/2017   MONOPCT 8 09/19/2017   EOSPCT 4 09/19/2017   BASOPCT 0 09/19/2017    CMP:  Lab Results  Component Value Date   NA 141 09/19/2017   K 4.1 09/19/2017   CL 105 09/19/2017   CO2 25 09/19/2017   BUN 13 09/19/2017   CREATININE 1.17 (H) 09/19/2017   PROT 6.5 09/19/2017   ALBUMIN 3.3 (L) 09/19/2017   BILITOT 0.2 (L) 09/19/2017   ALKPHOS 69 09/19/2017   AST 13 (L) 09/19/2017   ALT 10 (L) 09/19/2017    Coagulation: No results found for: PROTIME, INR,  PTT  Cardiac markers:  Lab Results  Component Value Date   TROPONINI <0.03 09/19/2017     Patient Active Problem List   Diagnosis Date Noted  . Acute on chronic diastolic CHF (congestive heart failure) ([place]) 02/11/2017    Priority: High  . Acute diastolic CHF (congestive heart failure) ([place]) 09/19/2017  . Iron deficiency anemia due to chronic blood loss   . Acute diverticulitis 08/29/2017  . Heme positive stool   . Symptomatic anemia   . GI bleed 08/27/2017  . Elevated troponin 08/27/2017  . (HFpEF) heart failure with preserved ejection fraction ([place]) 08/27/2017  . Acute dyspnea   . Acute on chronic respiratory failure ([place]) 02/11/2017  . COPD exacerbation ([place]) 11/20/2015  . Benign essential HTN 11/20/2015  . GERD (gastroesophageal reflux disease) 11/20/2015  . Tobacco abuse 11/20/2015  . Type 2 diabetes mellitus with hyperglycemia ([place]) 11/20/2015  . COPD exacerbation ([place]) 09/05/2015  . Chronic pain syndrome 09/05/2015  . Hypertension 09/05/2015  . Diabetes mellitus type 2, controlled ([place]) 09/05/2015  . Chronic respiratory failure ([place]) 09/05/2015  . OLECRANON BURSITIS, LEFT 08/04/2010   EKG      EKG Interpretation  Date/Time:                        Wednesday September 19 2017 13:27:47 EST Ventricular Rate:   66 PR Interval:                      156 QRS Duration:        84 QT Interval:                      424 QTC Calculation:    444 R Axis:                         74 Text Interpretation:  Normal sinus rhythm Possible Left atrial enlargement Borderline ECG    Assessment & Plan  1. Mild acute diastolic heart failure - Pt was given IV lasix in ED and she reports that she is feeling much better and refuses admission.  I recommend discharging on oral lasix and potassium and have her follow up with her PCP outpatient in 1 week. Also recommend that she continue daily weights and report weight gain>3 pounds to PCP.    2. Atypical pneumonia - suspect viral URI but  would cover with  antibiotics given chest xray findings.  Pt is feeling better and has refused hospital admission.   3. Tobacco - Pt was counseled and strongly advised to stop using tobacco products.  Pt verbalized understanding.    4.  DM type 2 - resume home treatment and management program and follow up with PCP.    Thank you for the consult.  [facility]

## 2017-09-19 NOTE — ED Provider Notes (Addendum)
[facility] EMERGENCY DEPARTMENT Provider Note   CSN: [id number] Arrival date & time: 09/19/17  1152     History   Chief Complaint No chief complaint on file.   HPI Erica [name] is a 66 y.o. female.  HPI  Hx of COPD and CHF 0 on hom3e O2 (unsure how much), presents with worsening SOB over the last few days - worsening - assocaited with swelling of the legs - no CP - no fevers, no rashes, no abdominal pain - but has some abd swelling.  She states she is not on lasix but takes 2 ASA 81mg  / day.  Trying to avoid Salts.  Sx are gradually worseninga nd severe today - very bad with ambulation and exertion.  Echocardiogram recorded from 3 weeks ago showed an ejection fraction of 60-65%, she did have findings of mild mitral regurgitation but there was no other ability to estimate diastolic function based on the quality of the study.  However the study from May 21 of 2018 showed mild grade 1 diastolic dysfunction.    Past Medical History:  Diagnosis Date  . Arthritis   . CHF (congestive heart failure) ([place])   . Chronic back pain   . COPD (chronic obstructive pulmonary disease) (HCC)    2L home O2  . Diabetes mellitus without complication ([place])   . Hypertension     Patient Active Problem List   Diagnosis Date Noted  . Acute diastolic CHF (congestive heart failure) ([place]) 09/19/2017  . Iron deficiency anemia due to chronic blood loss   . Acute diverticulitis 08/29/2017  . Heme positive stool   . Symptomatic anemia   . GI bleed 08/27/2017  . Elevated troponin 08/27/2017  . (HFpEF) heart failure with preserved ejection fraction ([place]) 08/27/2017  . Acute dyspnea   . Acute on chronic respiratory failure ([place]) 02/11/2017  . Acute on chronic diastolic CHF (congestive heart failure) ([place]) 02/11/2017  . COPD exacerbation ([place]) 11/20/2015  . Benign essential HTN 11/20/2015  . GERD (gastroesophageal reflux disease) 11/20/2015  . Tobacco abuse 11/20/2015  . Type 2 diabetes mellitus  with hyperglycemia ([place]) 11/20/2015  . COPD exacerbation ([place]) 09/05/2015  . Chronic pain syndrome 09/05/2015  . Hypertension 09/05/2015  . Diabetes mellitus type 2, controlled ([place]) 09/05/2015  . Chronic respiratory failure ([place]) 09/05/2015  . OLECRANON BURSITIS, LEFT 08/04/2010    Past Surgical History:  Procedure Laterality Date  . ABDOMINAL HYSTERECTOMY    . colonoscopy  2009   Dr. Gala Romney: cluster of diminutive rectal polyps and 2 diminutive polyps in rectosigmoid junction, hyperplastic  . ESOPHAGOGASTRODUODENOSCOPY (EGD) WITH PROPOFOL N/A 08/29/2017   Procedure: ESOPHAGOGASTRODUODENOSCOPY (EGD) WITH PROPOFOL;  Surgeon: Daneil Dolin, MD;  Location: [facility] ENDO SUITE;  Service: Gastroenterology;  Laterality: N/A;  . left hand middle finger     in an accident at work, amputated    OB History    Gravida Para Term Preterm AB Living   0 0 0 0 0     SAB TAB Ectopic Multiple Live Births   0 0 0           Home Medications    Prior to Admission medications   Medication Sig Start Date End Date Taking? Authorizing Provider  amLODipine (NORVASC) 5 MG tablet Take 1 tablet (5 mg total) by mouth daily. 08/31/17  Yes TatShanon Brow, MD  aspirin 81 MG tablet Take 2 tablets by mouth daily.  10/22/15  Yes [provider]  atorvastatin (LIPITOR) 80 MG tablet Take  80 mg by mouth daily.  12/04/16  Yes [provider]  budesonide-formoterol (SYMBICORT) 160-4.5 MCG/ACT inhaler Inhale 2 puffs into the lungs 2 (two) times daily. 11/23/15  Yes Barton Dubois, MD  buPROPion [facility] SR) 150 MG 12 hr tablet Take 150 mg by mouth 2 (two) times daily. 12/04/16  Yes [provider]  clopidogrel (PLAVIX) 75 MG tablet Take 75 mg by mouth daily.   Yes [provider]  famotidine (PEPCID) 20 MG tablet Take 20 mg by mouth 2 (two) times daily.   Yes [provider]  ipratropium-albuterol (DUONEB) 0.5-2.5 (3) MG/3ML SOLN Take 3 mLs by nebulization every 6 (six) hours as needed  (for shortness of breath).   Yes [provider]  Linaclotide (LINZESS) 145 MCG CAPS capsule Take 145 mcg by mouth daily.   Yes [provider]  metoprolol tartrate (LOPRESSOR) 25 MG tablet Take 0.5 tablets (12.5 mg total) by mouth 2 (two) times daily. 08/30/17  Yes Tat, Shanon Brow, MD  omega-3 acid ethyl esters (LOVAZA) 1 g capsule Take 2 g by mouth 2 (two) times daily.  12/07/16  Yes [provider]  omeprazole (PRILOSEC) 20 MG capsule Take 20 mg by mouth daily. 12/04/16  Yes [provider]  OXYGEN Inhale 2 L into the lungs continuous.   Yes [provider]  saxagliptin HCl (ONGLYZA) 5 MG TABS tablet Take 5 mg by mouth daily. Reported on 09/22/2015   Yes [provider]  amoxicillin-clavulanate (AUGMENTIN) 875-125 MG tablet Take 1 tablet by mouth every 12 (twelve) hours. 09/19/17   Noemi Chapel, MD  metroNIDAZOLE (FLAGYL) 500 MG tablet Take 1 tablet (500 mg total) by mouth every 8 (eight) hours. Patient not taking: Reported on 09/19/2017 08/30/17   Orson Eva, MD    Family History Family History  Problem Relation Age of Onset  . CVA Mother 8  . Cancer Neg Hx   . Colon cancer Neg Hx     Social History Social History   Tobacco Use  . Smoking status: Current Every Day Smoker    Packs/day: 1.00    Years: 52.00    Pack years: 52.00    Types: Cigarettes  . Smokeless tobacco: Never Used  Substance Use Topics  . Alcohol use: No  . Drug use: No     Allergies   Patient has no known allergies.   Review of Systems Review of Systems  All other systems reviewed and are negative.    Physical Exam Updated Vital Signs BP (!) 136/96   Pulse 69   Temp 98.1 F (36.7 C)   Resp (!) 24   Ht 5\' 4"  (1.626 m)   SpO2 100%   BMI 30.16 kg/m   Physical Exam  Constitutional: She appears well-developed and well-nourished. She appears distressed.  HENT:  Head: Normocephalic and atraumatic.  Mouth/Throat: Oropharynx is clear and moist. No  oropharyngeal exudate.  Eyes: Conjunctivae and EOM are normal. Pupils are equal, round, and reactive to light. Right eye exhibits no discharge. Left eye exhibits no discharge. No scleral icterus.  Neck: Normal range of motion. Neck supple. No JVD present. No thyromegaly present.  Cardiovascular: Normal rate, regular rhythm, normal heart sounds and intact distal pulses. Exam reveals no gallop and no friction rub.  No murmur heard. Pulmonary/Chest: She is in respiratory distress. She has wheezes. She has rales. She exhibits no tenderness.  Abdominal: Soft. Bowel sounds are normal. She exhibits no distension and no mass. There is no tenderness.  Musculoskeletal: Normal  range of motion. She exhibits edema. She exhibits no tenderness.  Lymphadenopathy:    She has no cervical adenopathy.  Neurological: She is alert. Coordination normal.  Skin: Skin is warm and dry. No rash noted. No erythema.  Psychiatric: She has a normal mood and affect. Her behavior is normal.  Nursing note and vitals reviewed.    ED Treatments / Results  Labs (all labs ordered are listed, but only abnormal results are displayed) Labs Reviewed  CBC WITH DIFFERENTIAL/PLATELET - Abnormal; Notable for the following components:      Result Value   RBC 3.72 (*)    Hemoglobin 8.7 (*)    HCT 29.1 (*)    MCH 23.4 (*)    MCHC 29.9 (*)    RDW 18.1 (*)    All other components within normal limits  COMPREHENSIVE METABOLIC PANEL - Abnormal; Notable for the following components:   Glucose, Bld 126 (*)    Creatinine, Ser 1.17 (*)    Calcium 8.7 (*)    Albumin 3.3 (*)    AST 13 (*)    ALT 10 (*)    Total Bilirubin 0.2 (*)    GFR calc non Af Amer 47 (*)    GFR calc Af Amer 54 (*)    All other components within normal limits  BRAIN NATRIURETIC PEPTIDE - Abnormal; Notable for the following components:   B Natriuretic Peptide 244.0 (*)    All other components within normal limits  CULTURE, BLOOD (ROUTINE X 2)  CULTURE, BLOOD  (ROUTINE X 2)  URINE CULTURE  TROPONIN I  LACTIC ACID, PLASMA  URINALYSIS, ROUTINE W REFLEX MICROSCOPIC  LACTIC ACID, PLASMA    EKG  EKG Interpretation  Date/Time:  Wednesday September 19 2017 13:27:47 EST Ventricular Rate:  66 PR Interval:  156 QRS Duration: 84 QT Interval:  424 QTC Calculation: 444 R Axis:   74 Text Interpretation:  Normal sinus rhythm Possible Left atrial enlargement Borderline ECG Since last tracing rate slower Confirmed by Noemi Chapel [phone]) on 09/19/2017 2:55:49 PM       Radiology Dg Chest 2 View  Result Date: 09/19/2017 CLINICAL DATA:  66 year old female with a history of increased shortness of breath for 1 week EXAM: CHEST  2 VIEW COMPARISON:  08/27/2017, 02/11/2017 FINDINGS: Cardiomediastinal silhouette unchanged in size and contour. Interval worsening of interlobular septal thickening and mixed interstitial and airspace opacities with blunting of the bilateral costophrenic angles. No pneumothorax. Calcifications of the aortic arch. No displaced fracture IMPRESSION: Worsening mixed interstitial and airspace opacity with small pleural effusions, most likely representing congestive heart failure, though atypical pneumonia cannot be excluded. Aortic calcifications. Electronically Signed   By: Corrie Mckusick D.O.   On: 09/19/2017 14:11    Procedures Procedures (including critical care time)  Medications Ordered in ED Medications  albuterol (PROVENTIL,VENTOLIN) solution continuous neb (not administered)  albuterol (PROVENTIL,VENTOLIN) solution continuous neb (10 mg/hr Nebulization New Bag/Given 09/19/17 1617)  0.9 %  sodium chloride infusion (1,000 mLs Intravenous New Bag/Given 09/19/17 1615)  azithromycin (ZITHROMAX) 500 mg in dextrose 5 % 250 mL IVPB (not administered)  albuterol (PROVENTIL,VENTOLIN) solution continuous neb (not administered)  methylPREDNISolone sodium succinate (SOLU-MEDROL) 125 mg/2 mL injection 125 mg (125 mg Intravenous Given  09/19/17 1609)  furosemide (LASIX) injection 40 mg (40 mg Intravenous Given 09/19/17 1610)  cefTRIAXone (ROCEPHIN) 1 g in dextrose 5 % 50 mL IVPB (1 g Intravenous New Bag/Given 09/19/17 1610)     Initial Impression / Assessment and Plan / ED Course  I have reviewed the triage vital signs and the nursing notes.  Pertinent labs & imaging results that were available during my care of the patient were reviewed by me and considered in my medical decision making (see chart for details).     Has symmetrical swelling edema of the legs, rales and wheezing and off O2 drops to 77%.  There is no significant history of severe heart failure suggesting that more of this may be COPD or possibly superimposed pneumonia.  Chest x-ray, EKG, labs pending, continuous nebulizer therapy.  The patient has improved with nebulizer, chronically anemic, abnormal x-ray showing infiltrates but without leukocytosis it is hard to know whether this is pneumonia versus pulmonary edema.  I have discussed her care with the hospitalist Dr. Wynetta Emery who will come to admit.  At this time the patient does not necessarily appear septic, code sepsis will be discontinued  The patient has now refused to be admitted to the hospital, she has received a dose of Lasix as well as some antibiotics, she was informed that her chest x-ray was abnormal, her labs were abnormal but she refuses to stay and wants to go home.  She was informed of the risks of leaving the hospital [place] and accepted these risks.  She was also evaluated by the hospitalist who recommended admission but she refused as well.  She will be given a course of Augmentin and encouraged to take her Lasix and return at any point should she change her mind or symptoms worsen.  She expressed her understanding  Final Clinical Impressions(s) / ED Diagnoses   Final diagnoses:  COPD exacerbation ([place])  Acute pulmonary edema [facility])    ED Discharge Orders         Ordered    amoxicillin-clavulanate (AUGMENTIN) 875-125 MG tablet  Every 12 hours     09/19/17 1815       Noemi Chapel, MD 09/19/17 1657    Noemi Chapel, MD 09/19/17 1816

## 2017-09-19 NOTE — Progress Notes (Signed)
Pharmacy Note:  Initial antibiotic(s) regimen of zithromax and ceftriaxone ordered by EDP to treat CAP.  CrCl cannot be calculated (Unknown ideal weight.).   No Known Allergies  Vitals:   09/19/17 1316 09/19/17 1514  BP: (!) 155/71 (!) 141/60  Pulse: 68 69  Resp: (!) 24 (!) 22  Temp: 98.1 F (36.7 C)   SpO2: (!) 78% (!) 87%    Anti-infectives (From admission, onward)   Start     Dose/Rate Route Frequency Ordered Stop   09/19/17 1545  cefTRIAXone (ROCEPHIN) 1 g in dextrose 5 % 50 mL IVPB     1 g 100 mL/hr over 30 Minutes Intravenous  Once 09/19/17 1533     09/19/17 1545  azithromycin (ZITHROMAX) 500 mg in dextrose 5 % 250 mL IVPB     500 mg 250 mL/hr over 60 Minutes Intravenous  Once 09/19/17 1533        Plan: Initial dose(s) of azithromycin 500mg  and ceftriaxone 1gm IV X 1 ordered. F/U admission orders for further dosing if therapy continued. Will sign off.  Isac Sarna, BS [place], [place] Clinical Pharmacist Pager [phone] 09/19/2017 3:43 PM

## 2017-09-19 NOTE — ED Triage Notes (Signed)
Short of breath

## 2017-09-19 NOTE — Discharge Instructions (Signed)
You have decided to leave the emergency department [place].  I want to admit you to the hospital because of your symptoms, your shortness of breath, your swelling and your abnormal x-ray but you have refused.  Please take the antibiotic that I have prescribed called Augmentin, twice a day for 7 days, you should take your Lasix for swelling and follow-up with your doctor in the office.  You may return at any time should you change your mind or should your symptoms worsen.

## 2017-09-21 ENCOUNTER — Other Ambulatory Visit: Payer: Self-pay | Admitting: *Deleted

## 2017-09-21 DIAGNOSIS — Z79899 Other long term (current) drug therapy: Secondary | ICD-10-CM | POA: Diagnosis not present

## 2017-09-21 DIAGNOSIS — E782 Mixed hyperlipidemia: Secondary | ICD-10-CM | POA: Diagnosis not present

## 2017-09-21 DIAGNOSIS — I10 Essential (primary) hypertension: Secondary | ICD-10-CM | POA: Diagnosis not present

## 2017-09-21 DIAGNOSIS — E119 Type 2 diabetes mellitus without complications: Secondary | ICD-10-CM | POA: Diagnosis not present

## 2017-09-21 NOTE — Patient Outreach (Addendum)
[facility]) Care Management  09/21/2017  Erica [name] Feb 03, 1951 [id number]   Transition of Care Referral  Referral Date: 09/21/17 Referral Source: [employer] [facility]) Date of Discharge: 09/19/17 Facility: [facility] Discharge Diagnosis: COPD Exacerbation Insurance: [place] attempt # 1 to patient. HIPAA identifiers verified x's 2. Patient was admitted to [facility] from 08/27/17 to 08/30/17 diagnosed with CHF and COPD exacerbation, per EMR. Patient had an emergency room visit on 09/19/17 for COPD exacerbation and she declined to be admitted. Patient discussed how she was just in the hospital and didn't want to be admitted again. Patient was prescribed Augmentin and Lasix during her visit at the emergency room. She plans to get the prescriptions filled on 09/22/17 due to her lack of finances. Patient verbalized having a visit with Dr. Maree Erie today at [facility] in [place]. She stated, "I had a difficult time with transportation due to my car not running". Patient has a scheduled appointment with Dr. [name] on 10/03/17. She voiced having difficulty with arranging transportation. Patient stated, she needs to be referred to a Cardiologist. Per patient, "She was told that her heart is enlarged".    Patient verbalized receiving and understanding her discharge instructions. She stated, "I am not monitoring my weight at home because I don't have a scale". She doesn't monitor her blood glucose at home. Patient stated, she doesn't follow a heart healthy diet. She verbalized drinking "a lot of water and sodas". She stated, "I eat a lot of bananas and fruits". Patient has a history of COPD, CHF, CKD 3, DM, HTN, Chronic back pain, Arthritis, and Hip Complications. [facility] benefits and services explained to patient. Patient agreed to services.    Plan: RN CM advised patient to contact RNCM for any needs or concerns. RN CM advised  patient to alert MD for any changes in conditions.  RN CM will send referral to [facility] RN for further in home eval/assessment of care needs and management of chronic conditions. RN CM will send referral to [place] for transition of care. RN CM will send [facility] SW referral for possible assistance with community resources related to transportation.   [place]ake Bells, RN, BSN, MHA/MSL, [place] Telephonic Care Manager Coordinator [facility] Direct Phone: [phone] Toll Free: [phone] Fax: [phone]

## 2017-09-24 ENCOUNTER — Other Ambulatory Visit: Payer: Self-pay | Admitting: *Deleted

## 2017-09-24 DIAGNOSIS — I10 Essential (primary) hypertension: Secondary | ICD-10-CM | POA: Diagnosis not present

## 2017-09-24 DIAGNOSIS — R918 Other nonspecific abnormal finding of lung field: Secondary | ICD-10-CM | POA: Diagnosis not present

## 2017-09-24 DIAGNOSIS — E7800 Pure hypercholesterolemia, unspecified: Secondary | ICD-10-CM | POA: Diagnosis not present

## 2017-09-24 DIAGNOSIS — J811 Chronic pulmonary edema: Secondary | ICD-10-CM | POA: Diagnosis not present

## 2017-09-24 DIAGNOSIS — K5730 Diverticulosis of large intestine without perforation or abscess without bleeding: Secondary | ICD-10-CM | POA: Diagnosis not present

## 2017-09-24 DIAGNOSIS — I517 Cardiomegaly: Secondary | ICD-10-CM | POA: Diagnosis not present

## 2017-09-24 DIAGNOSIS — Z720 Tobacco use: Secondary | ICD-10-CM | POA: Diagnosis not present

## 2017-09-24 DIAGNOSIS — Z79899 Other long term (current) drug therapy: Secondary | ICD-10-CM | POA: Diagnosis not present

## 2017-09-24 DIAGNOSIS — I509 Heart failure, unspecified: Secondary | ICD-10-CM | POA: Diagnosis not present

## 2017-09-24 DIAGNOSIS — K922 Gastrointestinal hemorrhage, unspecified: Secondary | ICD-10-CM | POA: Diagnosis not present

## 2017-09-24 DIAGNOSIS — J449 Chronic obstructive pulmonary disease, unspecified: Secondary | ICD-10-CM | POA: Diagnosis not present

## 2017-09-24 DIAGNOSIS — D649 Anemia, unspecified: Secondary | ICD-10-CM | POA: Diagnosis not present

## 2017-09-24 DIAGNOSIS — I110 Hypertensive heart disease with heart failure: Secondary | ICD-10-CM | POA: Diagnosis not present

## 2017-09-24 DIAGNOSIS — E782 Mixed hyperlipidemia: Secondary | ICD-10-CM | POA: Diagnosis not present

## 2017-09-24 DIAGNOSIS — R0602 Shortness of breath: Secondary | ICD-10-CM | POA: Diagnosis not present

## 2017-09-24 DIAGNOSIS — E119 Type 2 diabetes mellitus without complications: Secondary | ICD-10-CM | POA: Diagnosis not present

## 2017-09-24 LAB — CULTURE, BLOOD (ROUTINE X 2)
CULTURE: NO GROWTH
SPECIAL REQUESTS: ADEQUATE

## 2017-09-24 NOTE — Patient Outreach (Signed)
[facility]) Care Management  09/24/2017  TIKESHA MORT 1951/06/30 [id number]   CSW attempted to reach patient to follow-up on referral received from [place], [place] for transportation resources. CSW attempted patient's home#: [phone] but phone just kept ringing. CSW also tried patient's mobile #: [phone] but no answer & voicemail box has not been setup. CSW will make second attempt to reach patient later this week.    Raynaldo Opitz, LCSW [facility]  Clinical Social Worker cell #: [phone]

## 2017-09-25 DIAGNOSIS — N261 Atrophy of kidney (terminal): Secondary | ICD-10-CM | POA: Diagnosis not present

## 2017-09-25 DIAGNOSIS — K922 Gastrointestinal hemorrhage, unspecified: Secondary | ICD-10-CM | POA: Diagnosis not present

## 2017-09-25 DIAGNOSIS — I509 Heart failure, unspecified: Secondary | ICD-10-CM | POA: Diagnosis not present

## 2017-09-25 DIAGNOSIS — R0602 Shortness of breath: Secondary | ICD-10-CM | POA: Diagnosis not present

## 2017-09-25 DIAGNOSIS — J441 Chronic obstructive pulmonary disease with (acute) exacerbation: Secondary | ICD-10-CM | POA: Diagnosis not present

## 2017-09-25 DIAGNOSIS — J984 Other disorders of lung: Secondary | ICD-10-CM | POA: Diagnosis not present

## 2017-09-25 DIAGNOSIS — D649 Anemia, unspecified: Secondary | ICD-10-CM | POA: Diagnosis not present

## 2017-09-25 DIAGNOSIS — Z9981 Dependence on supplemental oxygen: Secondary | ICD-10-CM | POA: Diagnosis not present

## 2017-09-25 DIAGNOSIS — K5790 Diverticulosis of intestine, part unspecified, without perforation or abscess without bleeding: Secondary | ICD-10-CM | POA: Diagnosis not present

## 2017-09-25 LAB — CULTURE, BLOOD (ROUTINE X 2)
Culture: NO GROWTH
SPECIAL REQUESTS: ADEQUATE

## 2017-09-26 ENCOUNTER — Other Ambulatory Visit: Payer: Self-pay | Admitting: *Deleted

## 2017-09-26 ENCOUNTER — Other Ambulatory Visit: Payer: Self-pay

## 2017-09-26 NOTE — Patient Outreach (Signed)
Request received from Kelly Harrison, LCSW to mail patient personal care resources.  Information mailed today. 

## 2017-09-26 NOTE — Patient Outreach (Signed)
[facility]) Care Management  09/26/2017  Erica [name] March 22, 1951 [id number]  Transition of care/care coordination/telephone assessment  Erica [name] is a  67 year old female with PMH which includes congestive HF, DM II controlled,, COPD, HTN, GERD, tobacco use, arthritis, GI bleed, diverticulosis, iron deficiency anemia and HLD.  She was referred to [facility]) Care Management by [facility] for Transition of care services and first encountered by our telephonic nurse team. Lauretta Chester, telephonic RN was assisting Erica. Percell [name] with issues surrounding transition of care  Lauretta Chester, telephonic RN referred Erica [name] to the nursing team for further engagement for in home eval/assessment of care needs medical management on 09/21/17.  Cm was able to reach Erica [name] via telephone to discuss this referral to this [facility].  Erica [name] shared the following information with this Curator.  Discharge from hospital.  Epic indicated Erica [name] left against medical advices from a [place] system facility on 09/19/17 but Erica [name] also shares that she was further hospitalized in [place] and discharged home with assistance from a Social worker there on September 24 2017. She reports having her discharge sheets from this facility This Cm unable to see those discharge forms in EPIC  Appointments- She confirms she has not made a follow up appointment with her Primary care provider, Dr French Ana, since she was discharged.  CM encouraged her to call the MD office to make an appointment after discussing the importance of following up with Primary care provider after a hospitalization  [place] [facility]) Care Management SW services  Erica [name] confirms contact from [place]. She confirms she now has her care fixed,therefore has transportation   Medications Reviewed medications and she confirms "I have all my medicine.  I got them from the  [employer]" She reports having taken antibiotics and has lasix. She denies any issues with obtaining or affording her medications   congestive HF- Erica [name] did not recognized that one of her discharging diagnoses from the [place] system facility was  congestive HF.  After CM discussed  congestive HF, Erica [name] stated she understood the dx and confirms she weighed on her scales this am with a weight of 167 lbs. She states this is her baseline weight.  CM reviewed CHF action plan and emphasized contact with medical providers if she has a weight gain of 3- 5 lbs.    COPD- She confirms she does not have a pulmonologist following her but uses O2 at 2 L/[place] and her medications for management of COPD,. Denies SOB today\ GI PMH of GI bleed She reports being hospitalized in [place] for "blood found in my stool specimen but when I got to the hospital they did not find anything."   Diabetes When Cm inquired about today's cbg, Erica Wentworth reports she has not taken her cbg because she has to get more test strips from her pharmacy She reports not following her diet well and can not recall the type of diet until reminded   Upcoming appointments She confirms being aware of her appointment with PA, Lewis scheduled for 10/03/17 for GI hospital follow up  Support system She lives with "friend" She also repeated twice that she sees a female medical provider "Erica [name]" CM able to find - Cm able to find a medical provider with this name at a MD office in [place] Transition of care telephonic assessment completed Follow  up with Erica Hedstrom in 1-2 days to see if she has obtained a Primary care provider follow up visit, cbg test strips Follow up with Erica Ackerley for further transition of care services/needs to coordinate care prn  Route note to care team providers Sent outreach letters to patient and Primary care provider  [facility] CM Care Plan Problem One     Most Recent Value  Care Plan  Problem One  transition of care   Role Documenting the Problem One  Care Management [place] for Problem One  Active  [facility] Long Term Goal   over the next 45 days the patient will remain out of hospital with assist of transition of care managment and education for home care for congestive HF, review Congestive HF action plan  [facility] Long Term Goal Start Date  09/26/17  Interventions for Problem One Long Term Goal  complete transition of care assessment and coordinate care for identify needs, encouraged patient to follow up wtih MD, attempt to follow up MD office visit, referrals to other providers and community providers  [facility] CM Short Term Goal #1   over the next 14 days patient will obtain follow up appointment with MD (primary provider and/or CV MD) and cbg test strips  [facility] CM Short Term Goal #1 Start Date  09/26/17  Interventions for Short Term Goal #1  encouraged patient to call MD to make an appoiintment within 7-14 days per her schedule, attempt to meet patient at scheduled appointment to further coordinate care Follow up wtih patient for scheduled appointment and cbg test strips       Mac Dowdell L. Lavina Hamman, RN, BSN, [place] Care Management [phone]

## 2017-09-26 NOTE — Patient Outreach (Signed)
[facility]) Care Management  09/26/2017  Erica [name] 1951-06-22 [id number]   CSW called & spoke with patient who informed CSW that she no longer has a need for transportation resources as she was able to get her car fixed. Patient agreed to have information sent to patient should she need assistance in the future. CSW will have CMA mail information to patient & will follow-up in 3 weeks to ensure that she has received resources.    Raynaldo Opitz, LCSW [facility]  Clinical Social Worker cell #: [phone]

## 2017-09-27 DIAGNOSIS — J449 Chronic obstructive pulmonary disease, unspecified: Secondary | ICD-10-CM | POA: Diagnosis not present

## 2017-09-28 ENCOUNTER — Other Ambulatory Visit: Payer: Self-pay | Admitting: *Deleted

## 2017-09-28 NOTE — Patient Outreach (Signed)
[facility] [facility]) Care Management  09/28/2017  Erica [name] [birth year] [id number]   Care coordination   CM called Dr French Ana office to see if [name] made a follow appointment It was confrmed she did not and that the last time she was scheduled to see Dr hill was  on 06/13/17 and she was a "no show" for her 8 week follow up on 08/08/17  CM attempted to call [name] but her mobile number is not accepting calls at this time  [name] had agreed to call CM when she made her appointment with [name] to meet Cm at the appointment but has not called CM since CM spoke with her on 09/26/17  plan CM to attempt to follow up with [name] next week   [name], RN, BSN, [place] Care Management [phone]

## 2017-10-03 ENCOUNTER — Encounter: Payer: Self-pay | Admitting: *Deleted

## 2017-10-03 ENCOUNTER — Ambulatory Visit (INDEPENDENT_AMBULATORY_CARE_PROVIDER_SITE_OTHER): Payer: Medicare HMO | Admitting: Gastroenterology

## 2017-10-03 ENCOUNTER — Telehealth: Payer: Self-pay | Admitting: *Deleted

## 2017-10-03 ENCOUNTER — Encounter: Payer: Self-pay | Admitting: Gastroenterology

## 2017-10-03 ENCOUNTER — Other Ambulatory Visit: Payer: Self-pay | Admitting: *Deleted

## 2017-10-03 VITALS — BP 158/72 | HR 102 | Temp 97.0°F | Ht 64.0 in | Wt 179.0 lb

## 2017-10-03 DIAGNOSIS — D500 Iron deficiency anemia secondary to blood loss (chronic): Secondary | ICD-10-CM | POA: Diagnosis not present

## 2017-10-03 DIAGNOSIS — D509 Iron deficiency anemia, unspecified: Secondary | ICD-10-CM | POA: Diagnosis not present

## 2017-10-03 DIAGNOSIS — R195 Other fecal abnormalities: Secondary | ICD-10-CM

## 2017-10-03 MED ORDER — PEG 3350-KCL-NA BICARB-NACL 420 G PO SOLR: 4000.0000 mL | Freq: Once | ORAL | 0 refills | Status: AC

## 2017-10-03 NOTE — Assessment & Plan Note (Signed)
New onset microcytic anemia, heme positive stool diagnosed back in December when she presented with progressive shortness of breath.  Upper endoscopy performed for upper abdominal pain, unremarkable.  Felt to be NSAID related.  CT suspicious for diverticulitis involving the sigmoid colon.  She presents now for colonoscopy to follow-up on CT findings as well as microcytic anemia, heme positive stool.  Plan for colonoscopy in the near future with propofol.  I have discussed the risks, alternatives, benefits with regards to but not limited to the risk of reaction to medication, bleeding, infection, perforation and the patient is agreeable to proceed. Written consent to be obtained.  Will update labs today.   She is having difficulty getting back to her PCP, Erica [name] in [place] due to transportation. Medical transportation options provided.

## 2017-10-03 NOTE — Progress Notes (Signed)
cc'ed to pcp °

## 2017-10-03 NOTE — H&P (View-Only) (Signed)
Primary Care Physician:  Iona Beard, MD  Primary Gastroenterologist:  Garfield Cornea, MD   Chief Complaint  Patient presents with  . Follow-up    doing okay. Denies abd pain.  . Constipation    linzess not effective. has BM q 2-3 days    HPI:  Erica [name] is a 67 y.o. female here for hospital follow-up.  She was seen back in December for new onset microcytic anemia, heme positive stool, abdominal pain.  She actually presented to the hospital with worsening shortness of breath, dyspnea on exertion.  She was heme positive in the ED.  New onset microcytic anemia with hemoglobin of 7.5.  Hemoglobin in May 2018 was normal at 12.1.  CT abdomen pelvis with contrast showed mild acute diverticulitis involving the proximal sigmoid colon.  She endorsed significant ibuprofen use for back pain (postherpetic neuralgia).  She had also complained of upper abdominal pain for about a month.  Denied lower abdominal pain.  Treated for diverticulitis given compelling CT findings.  She underwent an endoscopy during her hospitalization showing small hiatal hernia, gastric fundal diverticulum.  It was felt that her upper abdominal pain was likely related to NSAID use.  She presents today to schedule colonoscopy for microcytic anemia, heme positive stools.  Last colonoscopy 2009.  She feels well.  Denies further abdominal pain.  Appetite is good.  Linzess 15mcg QOD is controlling her constipation. She has a bowel movement every other day.  If she takes daily she has frequent stools.  Stools are dark on iron.  No rectal bleeding.  No heartburn.   Current Outpatient Medications  Medication Sig Dispense Refill  . amLODipine (NORVASC) 5 MG tablet Take 1 tablet (5 mg total) by mouth daily. 30 tablet 1  . aspirin 81 MG tablet Take 2 tablets by mouth daily.     Marland Kitchen atorvastatin (LIPITOR) 80 MG tablet Take 80 mg by mouth daily.     . budesonide-formoterol (SYMBICORT) 160-4.5 MCG/ACT inhaler Inhale 2 puffs into the  lungs 2 (two) times daily. 1 Inhaler 3  . buPROPion (WELLBUTRIN SR) 150 MG 12 hr tablet Take 150 mg by mouth 2 (two) times daily.    . clopidogrel (PLAVIX) 75 MG tablet Take 75 mg by mouth daily.    . famotidine (PEPCID) 20 MG tablet Take 20 mg by mouth 2 (two) times daily.    Marland Kitchen ipratropium-albuterol (DUONEB) 0.5-2.5 (3) MG/3ML SOLN Take 3 mLs by nebulization every 6 (six) hours as needed (for shortness of breath).    . Linaclotide (LINZESS) 145 MCG CAPS capsule Take 145 mcg by mouth daily.    . metoprolol tartrate (LOPRESSOR) 25 MG tablet Take 0.5 tablets (12.5 mg total) by mouth 2 (two) times daily. 60 tablet 1  . omega-3 acid ethyl esters (LOVAZA) 1 g capsule Take 2 g by mouth 2 (two) times daily.     Marland Kitchen omeprazole (PRILOSEC) 20 MG capsule Take 20 mg by mouth daily.    . OXYGEN Inhale 2 L into the lungs continuous.    . saxagliptin HCl (ONGLYZA) 5 MG TABS tablet Take 5 mg by mouth daily. Reported on 09/22/2015    . valsartan-hydrochlorothiazide (DIOVAN-HCT) 160-25 MG tablet Take 1 tablet by mouth daily.     No current facility-administered medications for this visit.     Allergies as of 10/03/2017  . (No Known Allergies)    Past Medical History:  Diagnosis Date  . Arthritis   . CHF (congestive heart failure) ([place])   .  Chronic back pain   . COPD (chronic obstructive pulmonary disease) ([facility])    2L home O2  . Diabetes mellitus without complication ([place])   . Hypertension   . Stroke (cerebrum) [facility])     Past Surgical History:  Procedure Laterality Date  . ABDOMINAL HYSTERECTOMY    . colonoscopy  2009   Dr. Gala Romney: cluster of diminutive rectal polyps and 2 diminutive polyps in rectosigmoid junction, hyperplastic  . ESOPHAGOGASTRODUODENOSCOPY (EGD) WITH PROPOFOL N/A 08/29/2017   Procedure: ESOPHAGOGASTRODUODENOSCOPY (EGD) WITH PROPOFOL;  Surgeon: Daneil Dolin, MD;  [facility];  Service: Gastroenterology;  Laterality: N/A;  . left hand middle finger     in an accident  at work, amputated    Family History  Problem Relation Age of Onset  . CVA Mother 43  . Cancer Neg Hx   . Colon cancer Neg Hx     Social History   Socioeconomic History  . Marital status: Divorced    Spouse name: Not on file  . Number of children: Not on file  . Years of education: Not on file  . Highest education level: Not on file  Social Needs  . Financial resource strain: Not on file  . Food insecurity - worry: Not on file  . Food insecurity - inability: Not on file  . Transportation needs - medical: Not on file  . Transportation needs - non-medical: Not on file  Occupational History  . Occupation: retired  Tobacco Use  . Smoking status: Former Smoker    Packs/day: 1.00    Years: 52.00    Pack years: 52.00    Types: Cigarettes    Last attempt to quit: 09/23/2017    Years since quitting: 0.0  . Smokeless tobacco: Never Used  Substance and Sexual Activity  . Alcohol use: No  . Drug use: No  . Sexual activity: Not Currently  Other Topics Concern  . Not on file  Social History Narrative   ** Merged History Encounter **          ROS:  General: Negative for anorexia, weight loss, fever, chills, fatigue, weakness. Eyes: Negative for vision changes.  ENT: Negative for hoarseness, difficulty swallowing , nasal congestion. CV: Negative for chest pain, angina, palpitations, dyspnea on exertion, peripheral edema.  Respiratory: Negative for dyspnea at rest, dyspnea on exertion, cough, sputum, wheezing.  GI: See history of present illness. GU:  Negative for dysuria, hematuria, urinary incontinence, urinary frequency, nocturnal urination.  MS: Negative for joint pain, low back pain.  Derm: Negative for rash or itching.  Neuro: Negative for weakness, abnormal sensation, seizure, frequent headaches, memory loss, confusion.  Psych: Negative for anxiety, depression, suicidal ideation, hallucinations.  Endo: Negative for unusual weight change.  Heme: Negative for bruising  or bleeding. Allergy: Negative for rash or hives.    Physical Examination:  BP (!) 158/72   Pulse (!) 102   Temp (!) 97 F (36.1 C) (Oral)   Ht 5\' 4"  (1.626 m)   Wt 179 lb (81.2 kg)   BMI 30.73 kg/m    General: Well-nourished, well-developed in no acute distress.  Head: Normocephalic, atraumatic.   Eyes: Conjunctiva pink, no icterus. Mouth: Oropharyngeal mucosa moist and pink , no lesions erythema or exudate. Neck: Supple without thyromegaly, masses, or lymphadenopathy.  Lungs: Clear to auscultation bilaterally.  Heart: Regular rate and rhythm, no murmurs rubs or gallops.  Abdomen: Bowel sounds are normal, mild central abd tender, nondistended, no hepatosplenomegaly or masses, no abdominal bruits  or    hernia , no rebound or guarding.   Rectal: not performed Extremities: No lower extremity edema. No clubbing or deformities.  Neuro: Alert and oriented x 4 , grossly normal neurologically.  Skin: Warm and dry, no rash or jaundice.   Psych: Alert and cooperative, normal mood and affect.  Labs: Lab Results  Component Value Date   WBC 5.6 09/19/2017   HGB 8.7 (L) 09/19/2017   HCT 29.1 (L) 09/19/2017   MCV 78.2 09/19/2017   PLT 213 09/19/2017   Lab Results  Component Value Date   CREATININE 1.17 (H) 09/19/2017   BUN 13 09/19/2017   NA 141 09/19/2017   K 4.1 09/19/2017   CL 105 09/19/2017   CO2 25 09/19/2017   Lab Results  Component Value Date   ALT 10 (L) 09/19/2017   AST 13 (L) 09/19/2017   ALKPHOS 69 09/19/2017   BILITOT 0.2 (L) 09/19/2017   No results found for: IRON, TIBC, FERRITIN   Imaging Studies: Dg Chest 2 View  Result Date: 09/19/2017 CLINICAL DATA:  67 year old female with a history of increased shortness of breath for 1 week EXAM: CHEST  2 VIEW COMPARISON:  08/27/2017, 02/11/2017 FINDINGS: Cardiomediastinal silhouette unchanged in size and contour. Interval worsening of interlobular septal thickening and mixed interstitial and airspace opacities with  blunting of the bilateral costophrenic angles. No pneumothorax. Calcifications of the aortic arch. No displaced fracture IMPRESSION: Worsening mixed interstitial and airspace opacity with small pleural effusions, most likely representing congestive heart failure, though atypical pneumonia cannot be excluded. Aortic calcifications. Electronically Signed   By: Corrie Mckusick D.O.   On: 09/19/2017 14:11

## 2017-10-03 NOTE — Progress Notes (Signed)
Primary Care Physician:  Iona Beard, MD  Primary Gastroenterologist:  Garfield Cornea, MD   Chief Complaint  Patient presents with  . Follow-up    doing okay. Denies abd pain.  . Constipation    linzess not effective. has BM q 2-3 days    HPI:  Erica [name] is a 67 y.o. female here for hospital follow-up.  She was seen back in December for new onset microcytic anemia, heme positive stool, abdominal pain.  She actually presented to the hospital with worsening shortness of breath, dyspnea on exertion.  She was heme positive in the ED.  New onset microcytic anemia with hemoglobin of 7.5.  Hemoglobin in May 2018 was normal at 12.1.  CT abdomen pelvis with contrast showed mild acute diverticulitis involving the proximal sigmoid colon.  She endorsed significant ibuprofen use for back pain (postherpetic neuralgia).  She had also complained of upper abdominal pain for about a month.  Denied lower abdominal pain.  Treated for diverticulitis given compelling CT findings.  She underwent an endoscopy during her hospitalization showing small hiatal hernia, gastric fundal diverticulum.  It was felt that her upper abdominal pain was likely related to NSAID use.  She presents today to schedule colonoscopy for microcytic anemia, heme positive stools.  Last colonoscopy 2009.  She feels well.  Denies further abdominal pain.  Appetite is good.  Linzess 155mcg QOD is controlling her constipation. She has a bowel movement every other day.  If she takes daily she has frequent stools.  Stools are dark on iron.  No rectal bleeding.  No heartburn.   Current Outpatient Medications  Medication Sig Dispense Refill  . amLODipine (NORVASC) 5 MG tablet Take 1 tablet (5 mg total) by mouth daily. 30 tablet 1  . aspirin 81 MG tablet Take 2 tablets by mouth daily.     Marland Kitchen atorvastatin (LIPITOR) 80 MG tablet Take 80 mg by mouth daily.     . budesonide-formoterol (SYMBICORT) 160-4.5 MCG/ACT inhaler Inhale 2 puffs into the  lungs 2 (two) times daily. 1 Inhaler 3  . buPROPion (WELLBUTRIN SR) 150 MG 12 hr tablet Take 150 mg by mouth 2 (two) times daily.    . clopidogrel (PLAVIX) 75 MG tablet Take 75 mg by mouth daily.    . famotidine (PEPCID) 20 MG tablet Take 20 mg by mouth 2 (two) times daily.    Marland Kitchen ipratropium-albuterol (DUONEB) 0.5-2.5 (3) MG/3ML SOLN Take 3 mLs by nebulization every 6 (six) hours as needed (for shortness of breath).    . Linaclotide (LINZESS) 145 MCG CAPS capsule Take 145 mcg by mouth daily.    . metoprolol tartrate (LOPRESSOR) 25 MG tablet Take 0.5 tablets (12.5 mg total) by mouth 2 (two) times daily. 60 tablet 1  . omega-3 acid ethyl esters (LOVAZA) 1 g capsule Take 2 g by mouth 2 (two) times daily.     Marland Kitchen omeprazole (PRILOSEC) 20 MG capsule Take 20 mg by mouth daily.    . OXYGEN Inhale 2 L into the lungs continuous.    . saxagliptin HCl (ONGLYZA) 5 MG TABS tablet Take 5 mg by mouth daily. Reported on 09/22/2015    . valsartan-hydrochlorothiazide (DIOVAN-HCT) 160-25 MG tablet Take 1 tablet by mouth daily.     No current facility-administered medications for this visit.     Allergies as of 10/03/2017  . (No Known Allergies)    Past Medical History:  Diagnosis Date  . Arthritis   . CHF (congestive heart failure) ([place])   .  Chronic back pain   . COPD (chronic obstructive pulmonary disease) (H[facility])    2L home O2  . Diabetes mellitus without complication ([place])   . Hypertension   . Stroke (cerebrum) [facility])     Past Surgical History:  Procedure Laterality Date  . ABDOMINAL HYSTERECTOMY    . colonoscopy  2009   Dr. Gala Romney: cluster of diminutive rectal polyps and 2 diminutive polyps in rectosigmoid junction, hyperplastic  . ESOPHAGOGASTRODUODENOSCOPY (EGD) WITH PROPOFOL N/A 08/29/2017   Procedure: ESOPHAGOGASTRODUODENOSCOPY (EGD) WITH PROPOFOL;  Surgeon: Daneil Dolin, MD;  Location[facility];  Service: Gastroenterology;  Laterality: N/A;  . left hand middle finger     in an accident  at work, amputated    Family History  Problem Relation Age of Onset  . CVA Mother 22  . Cancer Neg Hx   . Colon cancer Neg Hx     Social History   Socioeconomic History  . Marital status: Divorced    Spouse name: Not on file  . Number of children: Not on file  . Years of education: Not on file  . Highest education level: Not on file  Social Needs  . Financial resource strain: Not on file  . Food insecurity - worry: Not on file  . Food insecurity - inability: Not on file  . Transportation needs - medical: Not on file  . Transportation needs - non-medical: Not on file  Occupational History  . Occupation: retired  Tobacco Use  . Smoking status: Former Smoker    Packs/day: 1.00    Years: 52.00    Pack years: 52.00    Types: Cigarettes    Last attempt to quit: 09/23/2017    Years since quitting: 0.0  . Smokeless tobacco: Never Used  Substance and Sexual Activity  . Alcohol use: No  . Drug use: No  . Sexual activity: Not Currently  Other Topics Concern  . Not on file  Social History Narrative   ** Merged History Encounter **          ROS:  General: Negative for anorexia, weight loss, fever, chills, fatigue, weakness. Eyes: Negative for vision changes.  ENT: Negative for hoarseness, difficulty swallowing , nasal congestion. CV: Negative for chest pain, angina, palpitations, dyspnea on exertion, peripheral edema.  Respiratory: Negative for dyspnea at rest, dyspnea on exertion, cough, sputum, wheezing.  GI: See history of present illness. GU:  Negative for dysuria, hematuria, urinary incontinence, urinary frequency, nocturnal urination.  MS: Negative for joint pain, low back pain.  Derm: Negative for rash or itching.  Neuro: Negative for weakness, abnormal sensation, seizure, frequent headaches, memory loss, confusion.  Psych: Negative for anxiety, depression, suicidal ideation, hallucinations.  Endo: Negative for unusual weight change.  Heme: Negative for bruising  or bleeding. Allergy: Negative for rash or hives.    Physical Examination:  BP (!) 158/72   Pulse (!) 102   Temp (!) 97 F (36.1 C) (Oral)   Ht 5\' 4"  (1.626 m)   Wt 179 lb (81.2 kg)   BMI 30.73 kg/m    General: Well-nourished, well-developed in no acute distress.  Head: Normocephalic, atraumatic.   Eyes: Conjunctiva pink, no icterus. Mouth: Oropharyngeal mucosa moist and pink , no lesions erythema or exudate. Neck: Supple without thyromegaly, masses, or lymphadenopathy.  Lungs: Clear to auscultation bilaterally.  Heart: Regular rate and rhythm, no murmurs rubs or gallops.  Abdomen: Bowel sounds are normal, mild central abd tender, nondistended, no hepatosplenomegaly or masses, no abdominal bruits  or    hernia , no rebound or guarding.   Rectal: not performed Extremities: No lower extremity edema. No clubbing or deformities.  Neuro: Alert and oriented x 4 , grossly normal neurologically.  Skin: Warm and dry, no rash or jaundice.   Psych: Alert and cooperative, normal mood and affect.  Labs: Lab Results  Component Value Date   WBC 5.6 09/19/2017   HGB 8.7 (L) 09/19/2017   HCT 29.1 (L) 09/19/2017   MCV 78.2 09/19/2017   PLT 213 09/19/2017   Lab Results  Component Value Date   CREATININE 1.17 (H) 09/19/2017   BUN 13 09/19/2017   NA 141 09/19/2017   K 4.1 09/19/2017   CL 105 09/19/2017   CO2 25 09/19/2017   Lab Results  Component Value Date   ALT 10 (L) 09/19/2017   AST 13 (L) 09/19/2017   ALKPHOS 69 09/19/2017   BILITOT 0.2 (L) 09/19/2017   No results found for: IRON, TIBC, FERRITIN   Imaging Studies: Dg Chest 2 View  Result Date: 09/19/2017 CLINICAL DATA:  67 year old female with a history of increased shortness of breath for 1 week EXAM: CHEST  2 VIEW COMPARISON:  08/27/2017, 02/11/2017 FINDINGS: Cardiomediastinal silhouette unchanged in size and contour. Interval worsening of interlobular septal thickening and mixed interstitial and airspace opacities with  blunting of the bilateral costophrenic angles. No pneumothorax. Calcifications of the aortic arch. No displaced fracture IMPRESSION: Worsening mixed interstitial and airspace opacity with small pleural effusions, most likely representing congestive heart failure, though atypical pneumonia cannot be excluded. Aortic calcifications. Electronically Signed   By: Corrie Mckusick D.O.   On: 09/19/2017 14:11

## 2017-10-03 NOTE — Telephone Encounter (Signed)
Spoke with pt and is aware preop scheduled for 10/26/17 Friday at 12:45pm. Letter mailed with appt details. Pt also aware she needs to hold iron 7 days prior to procedure. New instructions mailed to her with updated information.

## 2017-10-03 NOTE — Progress Notes (Signed)
Patient was made aware and new instructions mailed to her with this information.

## 2017-10-03 NOTE — Patient Instructions (Signed)
1. Please have your labs done today.  2. Colonoscopy as scheduled. See separate instructions.

## 2017-10-03 NOTE — Progress Notes (Signed)
PLEASE ASK PATIENT TO HOLD IRON FOR 7 DAYS BEFORE HER PROCEDURE.

## 2017-10-04 ENCOUNTER — Other Ambulatory Visit: Payer: Self-pay

## 2017-10-04 ENCOUNTER — Telehealth: Payer: Self-pay

## 2017-10-04 DIAGNOSIS — D509 Iron deficiency anemia, unspecified: Secondary | ICD-10-CM

## 2017-10-04 DIAGNOSIS — Z0100 Encounter for examination of eyes and vision without abnormal findings: Secondary | ICD-10-CM | POA: Diagnosis not present

## 2017-10-04 DIAGNOSIS — H524 Presbyopia: Secondary | ICD-10-CM | POA: Diagnosis not present

## 2017-10-04 LAB — IRON,TIBC AND FERRITIN PANEL
%SAT: 7 % — AB (ref 11–50)
Ferritin: 25 ng/mL (ref 20–288)
IRON: 28 ug/dL — AB (ref 45–160)
TIBC: 389 ug/dL (ref 250–450)

## 2017-10-04 LAB — CBC WITH DIFFERENTIAL/PLATELET
Basophils Absolute: 29 cells/uL (ref 0–200)
Basophils Relative: 0.4 %
EOS PCT: 2.4 %
Eosinophils Absolute: 173 cells/uL (ref 15–500)
HEMATOCRIT: 24.5 % — AB (ref 35.0–45.0)
HEMOGLOBIN: 7.4 g/dL — AB (ref 11.7–15.5)
LYMPHS ABS: 2052 {cells}/uL (ref 850–3900)
MCH: 21.9 pg — ABNORMAL LOW (ref 27.0–33.0)
MCHC: 30.2 g/dL — ABNORMAL LOW (ref 32.0–36.0)
MCV: 72.5 fL — ABNORMAL LOW (ref 80.0–100.0)
MONOS PCT: 7.7 %
MPV: 10.7 fL (ref 7.5–12.5)
NEUTROS ABS: 4392 {cells}/uL (ref 1500–7800)
Neutrophils Relative %: 61 %
Platelets: 362 10*3/uL (ref 140–400)
RBC: 3.38 10*6/uL — AB (ref 3.80–5.10)
RDW: 17.8 % — AB (ref 11.0–15.0)
Total Lymphocyte: 28.5 %
WBC mixed population: 554 cells/uL (ref 200–950)
WBC: 7.2 10*3/uL (ref 3.8–10.8)

## 2017-10-04 NOTE — Telephone Encounter (Signed)
Spoke with pt, pt notified.

## 2017-10-04 NOTE — Progress Notes (Signed)
Hgb down somewhat but yesterday patient feeling good. Baseline DOE, chronic oxygen. No lightheadedness or dizziness.  Continue iron twice daily until one week before colonoscopy.  Call if worsening shortness of breath, lightheadedness, fatigue.  Repeat H/H in 2 weeks.    [facility] clinical pool: Can we please get her procedure done sooner due to significant anemia?

## 2017-10-04 NOTE — Telephone Encounter (Signed)
Ferrous sulfate 325mg  oral bid. She can by OTC.

## 2017-10-04 NOTE — Telephone Encounter (Signed)
Pt was given lab results and asked to continue Iron twice daily until 1 week prior to TCS. Pt said she hasn't been taking iron tabs. Please tell me the strength of iron she should take otc.

## 2017-10-12 ENCOUNTER — Other Ambulatory Visit: Payer: Self-pay | Admitting: *Deleted

## 2017-10-12 NOTE — Patient Outreach (Signed)
[place] [facility]) Care Management  10/12/2017  Erica [name] December 25, 1950 [id number]   Care coordination/Transition of care   Upcoming appointments 10/17/17 [name] 1145   10/18/17 1420 at [facility] Dr Sydell Axon will complete a colonoscopy  She reports having one 10 years ago "but I di not have to go through all that I have to this time to get one"  Reports needing to purchase an enema and Bisacodyl Cm discussed purchasing these items with cost values at the [employer] and she then confirmed she "can afford that" CM and Erica [name] discussed the colonoscopy procedure in detail and what to expect.  Discussed the purpose is to attempt to identify irritated and swollen tissue, ulcers, or growths such as polyps and   possibly causes of bleeding in her intestines/colon She voiced understanding  Transportation car "was messing up but now it is fixed"  She is being followed by [facility] also and helped with transportation resources Refer to [employer] notes for SW   Medications "doing good" no issues   No falls   Denied s/s of depression   Wears Glasses and she reports " I don't see too good"  Pneumovax on 09/19/16 need flu   CHF "feet swell some times"  CM again reviewed the CHF action plan with her She reports she has not weighed today but reports she will  "when I get through eating. I weighed one hundred seventy eight pounds yesterday"  During the last call to Erica [name] she ws at 167 lbs and is now 178 lbs Cm discussed low sodium diet, fluid restrictions and activity    DM cbg not done today but she did it while on the phone with cm Her cbg was 137 She reports her BP was 116/57 and pulse was 88  She discussed not liking to "stick myself' and inquired about the [place] referred the to her pharmacy for cost of the glucose meter She reports if it is over $10-20 "I can't afford it." "I bleeding a lot when I check my sugar"  CM and patient discussed her  blood thinners and other medications.  She was encouraged to speak with [name] about this excess bleeding during next week office visit.  Last A1 C was 5.7 on 5/21/8 per [employer] (knowledge performance now online site) - Cm discussed this with Erica [name]    Plan follow up with Erica [name] for transition of care services in 1-2 weeks She agreed to [facility] possibly meeting her at one of her upcoming office visits  Route note to care team members   [name], RN, BSN, [place] Care Management [phone]

## 2017-10-12 NOTE — Patient Outreach (Signed)
[place] [facility]) Care Management  10/12/2017  Erica [name] 1951/01/25 [id number]   Opened in error   [name], RN, BSN, [place] Care Management [phone]

## 2017-10-15 ENCOUNTER — Encounter (HOSPITAL_COMMUNITY): Payer: Self-pay

## 2017-10-15 ENCOUNTER — Encounter (HOSPITAL_COMMUNITY)
Admission: RE | Admit: 2017-10-15 | Discharge: 2017-10-15 | Disposition: A | Discharge status: Home / Self Care | Payer: Medicare HMO | Source: Ambulatory Visit | Attending: Internal Medicine | Admitting: Internal Medicine

## 2017-10-15 ENCOUNTER — Inpatient Hospital Stay (HOSPITAL_COMMUNITY): Admission: RE | Admit: 2017-10-15 | Payer: Self-pay | Source: Ambulatory Visit

## 2017-10-16 DIAGNOSIS — J9610 Chronic respiratory failure, unspecified whether with hypoxia or hypercapnia: Secondary | ICD-10-CM | POA: Diagnosis not present

## 2017-10-16 DIAGNOSIS — J441 Chronic obstructive pulmonary disease with (acute) exacerbation: Secondary | ICD-10-CM | POA: Diagnosis not present

## 2017-10-17 ENCOUNTER — Other Ambulatory Visit: Payer: Self-pay | Admitting: *Deleted

## 2017-10-17 NOTE — Patient Outreach (Signed)
[place] [facility]) Care Management  10/17/2017  Erica [name] 12/14/1950 [id number]   Care coordination  Cm called to get an update on visit to [name] office  No answer when Cm called [name]'s phone Cm checked in [name] office and informed during the office visit that [name] called "Monday and changed her appointment" to Monday 10/22/17    Plan follow up with [name] related to her appointment with Dr Cassandria Anger L. Lavina Hamman, RN, BSN, [place] Care Management [phone]

## 2017-10-18 ENCOUNTER — Encounter (HOSPITAL_COMMUNITY): Payer: Self-pay | Admitting: *Deleted

## 2017-10-18 ENCOUNTER — Ambulatory Visit (HOSPITAL_COMMUNITY): Payer: Medicare HMO | Admitting: Anesthesiology

## 2017-10-18 ENCOUNTER — Ambulatory Visit (HOSPITAL_COMMUNITY)
Admission: RE | Admit: 2017-10-18 | Discharge: 2017-10-18 | Disposition: A | Discharge status: Home / Self Care | Payer: Medicare HMO | Source: Ambulatory Visit | Attending: Internal Medicine | Admitting: Internal Medicine

## 2017-10-18 ENCOUNTER — Encounter (HOSPITAL_COMMUNITY)
Admission: RE | Disposition: A | Discharge status: Home / Self Care | Payer: Self-pay | Source: Ambulatory Visit | Attending: Internal Medicine

## 2017-10-18 DIAGNOSIS — I110 Hypertensive heart disease with heart failure: Secondary | ICD-10-CM | POA: Insufficient documentation

## 2017-10-18 DIAGNOSIS — K5900 Constipation, unspecified: Secondary | ICD-10-CM | POA: Insufficient documentation

## 2017-10-18 DIAGNOSIS — D509 Iron deficiency anemia, unspecified: Secondary | ICD-10-CM | POA: Diagnosis not present

## 2017-10-18 DIAGNOSIS — G8929 Other chronic pain: Secondary | ICD-10-CM | POA: Diagnosis not present

## 2017-10-18 DIAGNOSIS — K5730 Diverticulosis of large intestine without perforation or abscess without bleeding: Secondary | ICD-10-CM | POA: Diagnosis not present

## 2017-10-18 DIAGNOSIS — B0229 Other postherpetic nervous system involvement: Secondary | ICD-10-CM | POA: Diagnosis not present

## 2017-10-18 DIAGNOSIS — Z79899 Other long term (current) drug therapy: Secondary | ICD-10-CM | POA: Diagnosis not present

## 2017-10-18 DIAGNOSIS — Z7982 Long term (current) use of aspirin: Secondary | ICD-10-CM | POA: Diagnosis not present

## 2017-10-18 DIAGNOSIS — E119 Type 2 diabetes mellitus without complications: Secondary | ICD-10-CM | POA: Diagnosis not present

## 2017-10-18 DIAGNOSIS — R195 Other fecal abnormalities: Secondary | ICD-10-CM | POA: Diagnosis not present

## 2017-10-18 DIAGNOSIS — Z9981 Dependence on supplemental oxygen: Secondary | ICD-10-CM | POA: Insufficient documentation

## 2017-10-18 DIAGNOSIS — Z8673 Personal history of transient ischemic attack (TIA), and cerebral infarction without residual deficits: Secondary | ICD-10-CM | POA: Diagnosis not present

## 2017-10-18 DIAGNOSIS — M1990 Unspecified osteoarthritis, unspecified site: Secondary | ICD-10-CM | POA: Insufficient documentation

## 2017-10-18 DIAGNOSIS — I509 Heart failure, unspecified: Secondary | ICD-10-CM | POA: Insufficient documentation

## 2017-10-18 DIAGNOSIS — J449 Chronic obstructive pulmonary disease, unspecified: Secondary | ICD-10-CM | POA: Diagnosis not present

## 2017-10-18 DIAGNOSIS — D649 Anemia, unspecified: Secondary | ICD-10-CM | POA: Diagnosis not present

## 2017-10-18 DIAGNOSIS — M549 Dorsalgia, unspecified: Secondary | ICD-10-CM | POA: Diagnosis not present

## 2017-10-18 DIAGNOSIS — Z87891 Personal history of nicotine dependence: Secondary | ICD-10-CM | POA: Insufficient documentation

## 2017-10-18 DIAGNOSIS — Z7902 Long term (current) use of antithrombotics/antiplatelets: Secondary | ICD-10-CM | POA: Insufficient documentation

## 2017-10-18 HISTORY — PX: COLONOSCOPY WITH PROPOFOL: SHX5780

## 2017-10-18 LAB — GLUCOSE, CAPILLARY: GLUCOSE-CAPILLARY: 92 mg/dL (ref 65–99)

## 2017-10-18 SURGERY — COLONOSCOPY WITH PROPOFOL
Anesthesia: Monitor Anesthesia Care

## 2017-10-18 MED ORDER — METOPROLOL TARTRATE 5 MG/5ML IV SOLN
INTRAVENOUS | Status: AC
  Filled 2017-10-18: qty 5

## 2017-10-18 MED ORDER — MIDAZOLAM HCL 2 MG/2ML IJ SOLN
INTRAMUSCULAR | Status: AC
  Filled 2017-10-18: qty 2

## 2017-10-18 MED ORDER — MIDAZOLAM HCL 2 MG/2ML IJ SOLN
1.0000 mg | INTRAMUSCULAR | Status: AC
  Administered 2017-10-18: 2 mg via INTRAVENOUS

## 2017-10-18 MED ORDER — FENTANYL CITRATE (PF) 100 MCG/2ML IJ SOLN
INTRAMUSCULAR | Status: AC
  Filled 2017-10-18: qty 2

## 2017-10-18 MED ORDER — METOPROLOL TARTRATE 5 MG/5ML IV SOLN
INTRAVENOUS | Status: DC | PRN
  Administered 2017-10-18: 2 mg via INTRAVENOUS

## 2017-10-18 MED ORDER — PROPOFOL 500 MG/50ML IV EMUL
INTRAVENOUS | Status: DC | PRN
  Administered 2017-10-18: 100 ug/kg/min via INTRAVENOUS

## 2017-10-18 MED ORDER — SIMETHICONE 40 MG/0.6ML PO SUSP
ORAL | Status: DC | PRN
  Administered 2017-10-18: 100 mL

## 2017-10-18 MED ORDER — CHLORHEXIDINE GLUCONATE CLOTH 2 % EX PADS: 6.0000 | MEDICATED_PAD | Freq: Once | CUTANEOUS | Status: DC

## 2017-10-18 MED ORDER — MIDAZOLAM HCL 5 MG/5ML IJ SOLN
INTRAMUSCULAR | Status: DC | PRN
  Administered 2017-10-18: 2 mg via INTRAVENOUS

## 2017-10-18 MED ORDER — LACTATED RINGERS IV SOLN
INTRAVENOUS | Status: DC
  Administered 2017-10-18: 15:00:00 via INTRAVENOUS

## 2017-10-18 MED ORDER — FENTANYL CITRATE (PF) 100 MCG/2ML IJ SOLN
25.0000 ug | Freq: Once | INTRAMUSCULAR | Status: AC
  Administered 2017-10-18: 25 ug via INTRAVENOUS

## 2017-10-18 NOTE — Anesthesia Preprocedure Evaluation (Signed)
Anesthesia Evaluation  Patient identified by MRN, date of birth, ID band Patient awake    Reviewed: Allergy & Precautions, NPO status , Patient's Chart, lab work & pertinent test results  Airway Mallampati: I  TM Distance: >3 FB     Dental no notable dental hx. (+) Edentulous Upper, Edentulous Lower   Pulmonary COPD,  COPD inhaler, Current Smoker, former smoker,    Pulmonary exam normal        Cardiovascular hypertension, Pt. on medications +CHF   Rhythm:Regular Rate:Normal   Systolic function was normal.   The estimated ejection fraction was in the range of 60% to 65%.   Wall motion was normal; there were no regional wall motion   abnormalities.     Neuro/Psych CVA    GI/Hepatic GERD  Medicated and Controlled,  Endo/Other  diabetes, Well Controlled, Type 2  Renal/GU      Musculoskeletal  (+) Arthritis ,   Abdominal Normal abdominal exam  (+)  Abdomen: soft.    Peds  Hematology  (+) anemia ,   Anesthesia Other Findings   Reproductive/Obstetrics                             Anesthesia Physical Anesthesia Plan  ASA: IV and emergent  Anesthesia Plan: MAC   Post-op Pain Management:    Induction: Intravenous  PONV Risk Score and Plan:   Airway Management Planned: Simple Face Mask  Additional Equipment:   Intra-op Plan:   Post-operative Plan:   Informed Consent: I have reviewed the patients History and Physical, chart, labs and discussed the procedure including the risks, benefits and alternatives for the proposed anesthesia with the patient or authorized representative who has indicated his/her understanding and acceptance.     Plan Discussed with: CRNA and Surgeon  Anesthesia Plan Comments:         Anesthesia Quick Evaluation

## 2017-10-18 NOTE — Discharge Instructions (Signed)
Colonoscopy Discharge Instructions  Read the instructions outlined below and refer to this sheet in the next few weeks. These discharge instructions provide you with general information on caring for yourself after you leave the hospital. Your doctor may also give you specific instructions. While your treatment has been planned according to the most current medical practices available, unavoidable complications occasionally occur. If you have any problems or questions after discharge, call Dr. Gala Romney at [phone]. ACTIVITY  You may resume your regular activity, but move at a slower pace for the next 24 hours.   Take frequent rest periods for the next 24 hours.   Walking will help get rid of the air and reduce the bloated feeling in your belly (abdomen).   No driving for 24 hours (because of the medicine (anesthesia) used during the test).    Do not sign any important legal documents or operate any machinery for 24 hours (because of the anesthesia used during the test).  NUTRITION  Drink plenty of fluids.   You may resume your normal diet as instructed by your doctor.   Begin with a light meal and progress to your normal diet. Heavy or fried foods are harder to digest and may make you feel sick to your stomach (nauseated).   Avoid alcoholic beverages for 24 hours or as instructed.  MEDICATIONS  You may resume your normal medications unless your doctor tells you otherwise.  WHAT YOU CAN EXPECT TODAY  Some feelings of bloating in the abdomen.   Passage of more gas than usual.   Spotting of blood in your stool or on the toilet paper.  IF YOU HAD POLYPS REMOVED DURING THE COLONOSCOPY:  No aspirin products for 7 days or as instructed.   No alcohol for 7 days or as instructed.   Eat a soft diet for the next 24 hours.  FINDING OUT THE RESULTS OF YOUR TEST Not all test results are available during your visit. If your test results are not back during the visit, make an appointment  with your caregiver to find out the results. Do not assume everything is normal if you have not heard from your caregiver or the medical facility. It is important for you to follow up on all of your test results.  SEEK IMMEDIATE MEDICAL ATTENTION IF:  You have more than a spotting of blood in your stool.   Your belly is swollen (abdominal distention).   You are nauseated or vomiting.   You have a temperature over 101.   You have abdominal pain or discomfort that is severe or gets worse throughout the day.     Diverticulosis information provided  Office visit in [redacted] weeks along with a CBC     Diverticulosis Diverticulosis is a condition that develops when small pouches (diverticula) form in the wall of the large intestine (colon). The colon is where water is absorbed and stool is formed. The pouches form when the inside layer of the colon pushes through weak spots in the outer layers of the colon. You may have a few pouches or many of them. What are the causes? The cause of this condition is not known. What increases the risk? The following factors may make you more likely to develop this condition:  Being older than age 55. Your risk for this condition increases with age. Diverticulosis is rare among people younger than age 36. By age 27, many people have it.  Eating a low-fiber diet.  Having frequent constipation.  Being overweight.  Not getting enough exercise.  Smoking.  Taking over-the-counter pain medicines, like aspirin and ibuprofen.  Having a family history of diverticulosis.  What are the signs or symptoms? In most people, there are no symptoms of this condition. If you do have symptoms, they may include:  Bloating.  Cramps in the abdomen.  Constipation or diarrhea.  Pain in the lower left side of the abdomen.  How is this diagnosed? This condition is most often diagnosed during an exam for other colon problems. Because diverticulosis usually has no  symptoms, it often cannot be diagnosed independently. This condition may be diagnosed by:  Using a flexible scope to examine the colon (colonoscopy).  Taking an X-ray of the colon after dye has been put into the colon (barium enema).  Doing a CT scan.  How is this treated? You may not need treatment for this condition if you have never developed an infection related to diverticulosis. If you have had an infection before, treatment may include:  Eating a high-fiber diet. This may include eating more fruits, vegetables, and grains.  Taking a fiber supplement.  Taking a live bacteria supplement (probiotic).  Taking medicine to relax your colon.  Taking antibiotic medicines.  Follow these instructions at home:  Drink 6-8 glasses of water or more each day to prevent constipation.  Try not to strain when you have a bowel movement.  If you have had an infection before: ? Eat more fiber as directed by your health care provider or your diet and nutrition specialist (dietitian). ? Take a fiber supplement or probiotic, if your health care provider approves.  Take over-the-counter and prescription medicines only as told by your health care provider.  If you were prescribed an antibiotic, take it as told by your health care provider. Do not stop taking the antibiotic even if you start to feel better.  Keep all follow-up visits as told by your health care provider. This is important. Contact a health care provider if:  You have pain in your abdomen.  You have bloating.  You have cramps.  You have not had a bowel movement in 3 days. Get help right away if:  Your pain gets worse.  Your bloating becomes very bad.  You have a fever or chills, and your symptoms suddenly get worse.  You vomit.  You have bowel movements that are bloody or black.  You have bleeding from your rectum. Summary  Diverticulosis is a condition that develops when small pouches (diverticula) form in the  wall of the large intestine (colon).  You may have a few pouches or many of them.  This condition is most often diagnosed during an exam for other colon problems.  If you have had an infection related to diverticulosis, treatment may include increasing the fiber in your diet, taking supplements, or taking medicines. This information is not intended to replace advice given to you by your health care provider. Make sure you discuss any questions you have with your health care provider. Document Released: 06/08/2004 Document Revised: 07/31/2016 Document Reviewed: 07/31/2016 Elsevier Interactive Patient Education  2017 Elsevier Inc[employer]   PATIENT INSTRUCTIONS POST-ANESTHESIA  IMMEDIATELY FOLLOWING SURGERY:  Do not drive or operate machinery for the first twenty four hours after surgery.  Do not make any important decisions for twenty four hours after surgery or while taking narcotic pain medications or sedatives.  If you develop intractable nausea and vomiting or a severe headache please notify your doctor immediately.  FOLLOW-UP:  Please make an  appointment with your surgeon as instructed. You do not need to follow up with anesthesia unless specifically instructed to do so. ° °WOUND CARE INSTRUCTIONS (if applicable):  Keep a dry clean dressing on the anesthesia/puncture wound site if there is drainage.  Once the wound has quit draining you may leave it open to air.  Generally you should leave the bandage intact for twenty four hours unless there is drainage.  If the epidural site drains for more than 36-48 hours please call the anesthesia department. ° °QUESTIONS?:  Please feel free to call your physician or the hospital operator if you have any questions, and they will be happy to assist you.    ° ° ° °

## 2017-10-18 NOTE — Anesthesia Postprocedure Evaluation (Signed)
Anesthesia Post Note  Patient: Erica [name]  Procedure(s) Performed: COLONOSCOPY WITH PROPOFOL (N/A )  Patient location during evaluation: PACU Anesthesia Type: MAC Level of consciousness: awake Pain management: pain level controlled Vital Signs Assessment: post-procedure vital signs reviewed and stable Respiratory status: spontaneous breathing, nonlabored ventilation and respiratory function stable Cardiovascular status: blood pressure returned to baseline Postop Assessment: no apparent nausea or vomiting Anesthetic complications: no     Last Vitals:  Vitals:   10/18/17 1530 10/18/17 1608  BP:  (!) 131/56  Pulse:  (!) (P) 111  Resp: 19   Temp:  (P) 37.1 C  SpO2: 100%     Last Pain:  Vitals:   10/18/17 1219  TempSrc: Oral                 Haruka Kowaleski J

## 2017-10-18 NOTE — Transfer of Care (Signed)
Immediate Anesthesia Transfer of Care Note  Patient: Erica [name]  Procedure(s) Performed: COLONOSCOPY WITH PROPOFOL (N/A )  Patient Location: PACU  Anesthesia Type:MAC  Level of Consciousness: drowsy  Airway & Oxygen Therapy: Patient Spontanous Breathing and Patient connected to face mask oxygen  Post-op Assessment: Report given to RN and Post -op Vital signs reviewed and stable  Post vital signs: Reviewed and stable  Last Vitals:  Vitals:   10/18/17 1515 10/18/17 1530  BP: 133/62   Pulse:    Resp: (!) 33 19  Temp:    SpO2: 100% 100%    Last Pain:  Vitals:   10/18/17 1219  TempSrc: Oral         Complications: No apparent anesthesia complications

## 2017-10-18 NOTE — Op Note (Signed)
[facility] Patient Name: Erica [name] Procedure Date: 10/18/2017 3:08 PM MRN: [id number] Date of Birth: 1951/05/31 Attending MD: Norvel Richards , MD CSN: [id number] Age: 67 Admit Type: Outpatient Procedure:                Colonoscopy Indications:              Iron deficiency anemia Providers:                Norvel Richards, MD, Lurline Del, RN, Aram Candela Referring MD:              Medicines:                Propofol per Anesthesia Complications:            No immediate complications. Estimated Blood Loss:     Estimated blood loss: none. Estimated blood loss:                            none. Estimated blood loss: none. Procedure:                Pre-Anesthesia Assessment:                           - Prior to the procedure, a History and Physical                            was performed, and patient medications and                            allergies were reviewed. The patient's tolerance of                            previous anesthesia was also reviewed. The risks                            and benefits of the procedure and the sedation                            options and risks were discussed with the patient.                            All questions were answered, and informed consent                            was obtained. Prior Anticoagulants: The patient has                            taken no previous anticoagulant or antiplatelet                            agents. ASA Grade Assessment: III - A patient with  severe systemic disease. After reviewing the risks                            and benefits, the patient was deemed in                            satisfactory condition to undergo the procedure.                           After obtaining informed consent, the colonoscope                            was passed under direct vision. Throughout the                            procedure, the patient's blood  pressure, pulse, and                            oxygen saturations were monitored continuously. The                            EC38-i10L ([id number]) scope was introduced through                            the and advanced to the the cecum, identified by                            appendiceal orifice and ileocecal valve. The                            colonoscopy was performed without difficulty. The                            patient tolerated the procedure well. The quality                            of the bowel preparation was poor. The ileocecal                            valve, appendiceal orifice, and rectum were                            photographed. The colonoscopy was performed without                            difficulty. The patient tolerated the procedure                            well. A portion of the colon was not fully examined. Scope In: 3:48:17 PM Scope Out: 4:00:04 PM Scope Withdrawal Time: 0 hours 6 minutes 10 seconds  Total Procedure Duration: 0 hours 11 minutes 47 seconds  Findings:      The perianal and digital rectal examinations were normal.      Scattered small and large-mouthed diverticula  were found in the sigmoid       colon and descending colon. Suboptimal preparation Impression:               - Diverticulosis in the sigmoid colon and in the                            descending colon.                           - No specimens collected. suboptimal preparation. Moderate Sedation:      Moderate (conscious) sedation was personally administered by an       anesthesia professional. The following parameters were monitored: oxygen       saturation, heart rate, blood pressure, respiratory rate, EKG, adequacy       of pulmonary ventilation, and response to care. Total physician       intraservice time was 30 minutes. Recommendation:           - Patient has a contact number available for                            emergencies. The signs and symptoms of potential                             delayed complications were discussed with the                            patient. Return to normal activities tomorrow.                            Written discharge instructions were provided to the                            patient.                           - Resume regular diet.                           - Continue present medications.                           -                           - Return to GI clinic in [redacted] weeks along with a CBC.                            Will determine whether or not GI evaluation is                            warranted or not at that time. Procedure Code(s):        --- Professional ---                           [id number], Colonoscopy, flexible; diagnostic, including  collection of specimen(s) by brushing or washing,                            when performed (separate procedure) Diagnosis Code(s):        --- Professional ---                           D50.9, Iron deficiency anemia, unspecified                           K57.30, Diverticulosis of large intestine without                            perforation or abscess without bleeding CPT copyright 2016 American Medical Association. All rights reserved. The codes documented in this report are preliminary and upon coder review may  be revised to meet current compliance requirements. Cristopher Estimable. [name], MD Norvel Richards, MD 10/18/2017 4:11:53 PM This report has been signed electronically. Number of Addenda: 0

## 2017-10-18 NOTE — Interval H&P Note (Signed)
History and Physical Interval Note:  10/18/2017 3:22 PM  Erica [name]  has presented today for surgery, with the diagnosis of anemia, heme + stool  The various methods of treatment have been discussed with the patient and family. After consideration of risks, benefits and other options for treatment, the patient has consented to  Procedure(s) with comments: COLONOSCOPY WITH PROPOFOL (N/A) - 9:30am as a surgical intervention .  The patient's history has been reviewed, patient examined, no change in status, stable for surgery.  I have reviewed the patient's chart and labs.  Questions were answered to the patient's satisfaction.     Nishka Heide  No change for diagnostic colonoscopy per plan.  The risks, benefits, limitations, alternatives and imponderables have been reviewed with the patient. Questions have been answered. All parties are agreeable.

## 2017-10-19 ENCOUNTER — Other Ambulatory Visit: Payer: Self-pay | Admitting: *Deleted

## 2017-10-19 NOTE — Patient Outreach (Signed)
[facility]) Care Management  10/19/2017  KALLI GREENFIELD Sep 12, 1951 [id number]  Care coordination  Community CM called to continue transition of care services for Erica [name]  She had not seen her pcp and had made and appointment for this week but cancelled it she reports. Cm confirmed this with [name] office staff She confirms she changed her appointment to Monday 10/22/17 and will need to change it again because she does not have any money to put gas in her car to drive to [place] When CM inquired about possible obtaining a MD closer to her home she stated "I have been with [name] for about fifteen to twenty years now and I use to go to see someone but she did not check me out as good as he does. "I'll ask if they make it after February second so I can get my check and have money for gas" CM discussed the importance of follow up with her MD  GI bleeding Erica spoerl confirmed she did go to have her colonoscopy for microcytic anemia, heme positive stools with Dr Gala Romney on 10/18/17  " They did not find anything."  "I noticed now my words are trying to get twisted on me since I had this year." "It is like my tongue gets tied."  It is about the same not worst" CM reviewed the stroke symptoms for worsening symptom CM encourage her to ask [name] about Speech therapy service at her next appointment  She also want to ask him about a lighter oxygen tank She uses 2 L oxygen and reports the machine is "too heavy It is causing my back to hurt."   Discussed CM's collaboration with [facility] and  That CM was informed by the [facility] of case closure    Erica Morden confirms she "has to go to sign paper so I can ride the bus when I can't drive to my appointments."   Plan To follow up with Erica Coor for further transition of care services in 1-2 weeks  Cm encouraged her to contact CM after she has confirmed the correct date she is going to see [name] so that CM can attempt to meet her at  this appointment   [name], RN, BSN, [place] Care Management [phone]

## 2017-10-19 NOTE — Patient Outreach (Signed)
[place] [facility]) Care Management  10/19/2017  Erica [name] 09/14/1951 [id number]   CSW called & spoke with patient to follow-up on transportation resources. Patient reports that she has received the information on [facility] sent by CMA and that her sister was able to take her to her appointment yesterday at [facility] for a colonoscopy.   CSW will perform a case closure on patient, as all goals of treatment have been met from social work standpoint and no additional social work needs have been identified at this time. CSW will notify patient's RNCM with [facility], Joellyn Quails of CSW's plans to close patient's case.   CSW will fax an update to patient's Primary Care Physician, Dr. Iona Beard to ensure that they are aware of CSW's involvement with patient's plan of care. CSW ensured patient of RNCM with [facility] continued involvement with patient's care.    Raynaldo Opitz, LCSW [facility]  Clinical Social Worker cell #: [phone]

## 2017-10-22 ENCOUNTER — Encounter (HOSPITAL_COMMUNITY): Payer: Self-pay | Admitting: Internal Medicine

## 2017-10-23 ENCOUNTER — Telehealth: Payer: Self-pay | Admitting: Internal Medicine

## 2017-10-23 NOTE — Telephone Encounter (Signed)
Pt had procedure on 1/24 and was calling to see if results were available. Please call [phone]

## 2017-10-25 NOTE — Telephone Encounter (Signed)
Spoke with pt, will notify pt when results are ready.

## 2017-10-26 ENCOUNTER — Other Ambulatory Visit (HOSPITAL_COMMUNITY): Payer: Self-pay

## 2017-10-28 DIAGNOSIS — J449 Chronic obstructive pulmonary disease, unspecified: Secondary | ICD-10-CM | POA: Diagnosis not present

## 2017-10-31 DIAGNOSIS — E118 Type 2 diabetes mellitus with unspecified complications: Secondary | ICD-10-CM | POA: Diagnosis not present

## 2017-10-31 DIAGNOSIS — I10 Essential (primary) hypertension: Secondary | ICD-10-CM | POA: Diagnosis not present

## 2017-10-31 DIAGNOSIS — J449 Chronic obstructive pulmonary disease, unspecified: Secondary | ICD-10-CM | POA: Diagnosis not present

## 2017-10-31 DIAGNOSIS — D509 Iron deficiency anemia, unspecified: Secondary | ICD-10-CM | POA: Diagnosis not present

## 2017-11-02 ENCOUNTER — Other Ambulatory Visit: Payer: Self-pay

## 2017-11-02 ENCOUNTER — Other Ambulatory Visit: Payer: Self-pay | Admitting: *Deleted

## 2017-11-02 NOTE — Patient Outreach (Signed)
[facility]) Care Management  11/02/2017  Erica [name] 12/12/1950 [id number]   Transition services/care coordination  [facility] Curator ([place]) received update from Cabin crew. [facility] CM spoke with Erica [name] for last transition of care contact call today  She confirmed with CM she did go to see her Primary care provider, [name] "It was doing good" She states he did not say anything about "my tests results"   CM inquired about her exam with [name] and was informed that he checked "my ears,  Nose, throat, feet, legs"   CHF She denies swelling of legs, cough or increase weight. She informed CM "I am watching my diet."  DM she is not aware of what her recent A1c is and states [name] is suppose to call her back about it  HTN She reports her Systolic BP was "good at 789 but could not recall her diastolic BP  Transportation Erica [name] confirmed with Cm that she is to "Go on Monday to talk with the social worker" for assistance with transportation.  CM emphasized again that this assistance would be for medical  Appointments. She voiced understanding  GI - Erica [name] states she is not to follow back up with Dr Sydell Axon but Dr Bobby Rumpf Reports Dr Sydell Axon only completed her colonoscopy.  When CM questioned what she may have eaten in December 2018 to possibly initiate diverticulitis she stated " I love grapes and peanuts.  COPD- continue use of her oxygen at home without voiced concerns. Denies sob, cough, wheezing etc  Medical providers She confirms she only sees [name], Primary care provider, Dr Bobby Rumpf -GI and Has seen NP Prudy Feeler in the past   Medications-  Erica [name] has all her medications but informed CM she was not sure of what she was taking each for.  CM reviewed each and told her what each was used for. She voiced understanding and appreciation again.    Concern during the call - back pain- "My back aches so bad sometimes that I'm not  able to walk.  I have discs messed up back there" CM reviewed standard recommended treatment for back pain to include heat, cold, massage, pain medications and DME prn.  She confirms she has had to use her cane prn. She confirms that [name] has her on Hydrocodone prn and she has had "therapy before and still stand with my back against the wall to help."  She mentions she will use her [employer] over the counter booklet to check for possible massager for her back.   Upcoming appointments Dr [name] GI 11/30/17 0830 [name] 11/30/17 0945 Erica [name] reports she will have to call to reschedule her appointment with [name] because she does not think she can make the appointment and be to [name] office in time   Plan CM will continue to follow Erica [name] for complex CM services with contact to be made in 2-3 weeks or prn is She returns a call to CM CM again had Erica [name] to write down CM mobile number, CM work hours and the 24 hour RN line number and encouraged use of these numbers CM reviewed the colonoscopy information in Epic to include the dx diverticulosis of the large intestine without perforation or infection.  Discussed differences in polyps and diverticulosis and diverticulitis. Discussed home care for prevention of diverticulitis Discuss no seeds, fatty or red meats Cm recommend possible trying seedless grapes or peanut  butter.  CM reviewed standard recommended treatment for back pain to include heat, cold, massage, pain medications CM encouraged Erica [name] again to contact CM to meet her at her next appointment Reviewed medications with the brand and generic names     [name], RN, BSN, [place] Coordinator [phone] week day mobile

## 2017-11-16 DIAGNOSIS — J9610 Chronic respiratory failure, unspecified whether with hypoxia or hypercapnia: Secondary | ICD-10-CM | POA: Diagnosis not present

## 2017-11-16 DIAGNOSIS — J441 Chronic obstructive pulmonary disease with (acute) exacerbation: Secondary | ICD-10-CM | POA: Diagnosis not present

## 2017-11-23 ENCOUNTER — Other Ambulatory Visit: Payer: Self-pay | Admitting: *Deleted

## 2017-11-23 NOTE — Patient Outreach (Signed)
[facility]) Care Management  11/23/2017  MARGURIETE WOOTAN [birth year] [id number]   Care coordination  Cm spoke with Mrs Burkhammer after a female answered. She denied having any medical coincerns  "I feel a lot better" Mrs Tenenbaum concluded the call and thanked CM for calling    Plans Cm discussed case closure and Mrs Pogorzelski agreed  [name], RN, BSN, [place] Coordinator [phone] week day mobile

## 2017-11-25 DIAGNOSIS — J449 Chronic obstructive pulmonary disease, unspecified: Secondary | ICD-10-CM | POA: Diagnosis not present

## 2017-11-30 ENCOUNTER — Encounter: Payer: Self-pay | Admitting: Gastroenterology

## 2017-11-30 ENCOUNTER — Ambulatory Visit: Payer: Medicare HMO | Admitting: Gastroenterology

## 2017-11-30 ENCOUNTER — Telehealth: Payer: Self-pay | Admitting: Gastroenterology

## 2017-11-30 NOTE — Telephone Encounter (Signed)
PATIENT WAS A NO SHOW AND LETTER SENT  °

## 2017-12-03 DIAGNOSIS — E118 Type 2 diabetes mellitus with unspecified complications: Secondary | ICD-10-CM | POA: Diagnosis not present

## 2017-12-03 DIAGNOSIS — D509 Iron deficiency anemia, unspecified: Secondary | ICD-10-CM | POA: Diagnosis not present

## 2017-12-03 DIAGNOSIS — J449 Chronic obstructive pulmonary disease, unspecified: Secondary | ICD-10-CM | POA: Diagnosis not present

## 2017-12-03 DIAGNOSIS — I10 Essential (primary) hypertension: Secondary | ICD-10-CM | POA: Diagnosis not present

## 2017-12-03 NOTE — Telephone Encounter (Signed)
Patient with h/o profound anemia and was supposed to have follow up labs. She no showed.   Please find out if she have labs with another provider?  Please let her know it is very important to follow up regarding this significant anemia.

## 2017-12-04 NOTE — Telephone Encounter (Signed)
Spoke with pt and she had lab work yesterday at the [facility] ([name]). I spoke with the [facility] and they are faxing labs over. Will place on [facility] desk.  R/s pt for 01/14/18. Pt only wants to see [facility].

## 2017-12-07 ENCOUNTER — Other Ambulatory Visit: Payer: Self-pay

## 2017-12-07 ENCOUNTER — Inpatient Hospital Stay (HOSPITAL_COMMUNITY)
Admission: EM | Admit: 2017-12-07 | Discharge: 2017-12-10 | DRG: 291 | Disposition: A | Discharge status: Home / Self Care | Payer: Medicare HMO | Attending: Internal Medicine | Admitting: Internal Medicine

## 2017-12-07 ENCOUNTER — Inpatient Hospital Stay (HOSPITAL_COMMUNITY): Payer: Medicare HMO

## 2017-12-07 ENCOUNTER — Encounter (HOSPITAL_COMMUNITY): Payer: Self-pay | Admitting: Emergency Medicine

## 2017-12-07 ENCOUNTER — Emergency Department (HOSPITAL_COMMUNITY): Payer: Medicare HMO

## 2017-12-07 DIAGNOSIS — E1165 Type 2 diabetes mellitus with hyperglycemia: Secondary | ICD-10-CM | POA: Diagnosis not present

## 2017-12-07 DIAGNOSIS — R0602 Shortness of breath: Secondary | ICD-10-CM | POA: Diagnosis not present

## 2017-12-07 DIAGNOSIS — I130 Hypertensive heart and chronic kidney disease with heart failure and stage 1 through stage 4 chronic kidney disease, or unspecified chronic kidney disease: Principal | ICD-10-CM | POA: Diagnosis present

## 2017-12-07 DIAGNOSIS — Z7982 Long term (current) use of aspirin: Secondary | ICD-10-CM | POA: Diagnosis not present

## 2017-12-07 DIAGNOSIS — G894 Chronic pain syndrome: Secondary | ICD-10-CM | POA: Diagnosis present

## 2017-12-07 DIAGNOSIS — J441 Chronic obstructive pulmonary disease with (acute) exacerbation: Secondary | ICD-10-CM | POA: Diagnosis not present

## 2017-12-07 DIAGNOSIS — E1122 Type 2 diabetes mellitus with diabetic chronic kidney disease: Secondary | ICD-10-CM | POA: Diagnosis not present

## 2017-12-07 DIAGNOSIS — J810 Acute pulmonary edema: Secondary | ICD-10-CM | POA: Diagnosis not present

## 2017-12-07 DIAGNOSIS — M1990 Unspecified osteoarthritis, unspecified site: Secondary | ICD-10-CM | POA: Diagnosis not present

## 2017-12-07 DIAGNOSIS — E875 Hyperkalemia: Secondary | ICD-10-CM | POA: Diagnosis not present

## 2017-12-07 DIAGNOSIS — K219 Gastro-esophageal reflux disease without esophagitis: Secondary | ICD-10-CM | POA: Diagnosis present

## 2017-12-07 DIAGNOSIS — J9621 Acute and chronic respiratory failure with hypoxia: Secondary | ICD-10-CM | POA: Diagnosis present

## 2017-12-07 DIAGNOSIS — Z823 Family history of stroke: Secondary | ICD-10-CM | POA: Diagnosis not present

## 2017-12-07 DIAGNOSIS — Z8673 Personal history of transient ischemic attack (TIA), and cerebral infarction without residual deficits: Secondary | ICD-10-CM | POA: Diagnosis not present

## 2017-12-07 DIAGNOSIS — Z7902 Long term (current) use of antithrombotics/antiplatelets: Secondary | ICD-10-CM

## 2017-12-07 DIAGNOSIS — Z87891 Personal history of nicotine dependence: Secondary | ICD-10-CM

## 2017-12-07 DIAGNOSIS — N183 Chronic kidney disease, stage 3 (moderate): Secondary | ICD-10-CM | POA: Diagnosis present

## 2017-12-07 DIAGNOSIS — Z90710 Acquired absence of both cervix and uterus: Secondary | ICD-10-CM | POA: Diagnosis not present

## 2017-12-07 DIAGNOSIS — I5033 Acute on chronic diastolic (congestive) heart failure: Secondary | ICD-10-CM | POA: Diagnosis present

## 2017-12-07 DIAGNOSIS — R05 Cough: Secondary | ICD-10-CM | POA: Diagnosis not present

## 2017-12-07 DIAGNOSIS — R0603 Acute respiratory distress: Secondary | ICD-10-CM

## 2017-12-07 DIAGNOSIS — N184 Chronic kidney disease, stage 4 (severe): Secondary | ICD-10-CM

## 2017-12-07 DIAGNOSIS — Z794 Long term (current) use of insulin: Secondary | ICD-10-CM | POA: Diagnosis not present

## 2017-12-07 DIAGNOSIS — N179 Acute kidney failure, unspecified: Secondary | ICD-10-CM

## 2017-12-07 LAB — COMPREHENSIVE METABOLIC PANEL
ALT: 9 U/L — AB (ref 14–54)
ANION GAP: 11 (ref 5–15)
AST: 10 U/L — ABNORMAL LOW (ref 15–41)
Albumin: 3.3 g/dL — ABNORMAL LOW (ref 3.5–5.0)
Alkaline Phosphatase: 90 U/L (ref 38–126)
BUN: 28 mg/dL — ABNORMAL HIGH (ref 6–20)
CHLORIDE: 104 mmol/L (ref 101–111)
CO2: 26 mmol/L (ref 22–32)
Calcium: 8.7 mg/dL — ABNORMAL LOW (ref 8.9–10.3)
Creatinine, Ser: 1.7 mg/dL — ABNORMAL HIGH (ref 0.44–1.00)
GFR, EST AFRICAN AMERICAN: 35 mL/min — AB (ref >60)
GFR, EST NON AFRICAN AMERICAN: 30 mL/min — AB (ref >60)
Glucose, Bld: 167 mg/dL — ABNORMAL HIGH (ref 65–99)
POTASSIUM: 4 mmol/L (ref 3.5–5.1)
SODIUM: 141 mmol/L (ref 135–145)
Total Bilirubin: 0.3 mg/dL (ref 0.3–1.2)
Total Protein: 6.8 g/dL (ref 6.5–8.1)

## 2017-12-07 LAB — CBC WITH DIFFERENTIAL/PLATELET
Basophils Absolute: 0 10*3/uL (ref 0.0–0.1)
Basophils Relative: 0 %
EOS ABS: 0.2 10*3/uL (ref 0.0–0.7)
EOS PCT: 3 %
HCT: 32.1 % — ABNORMAL LOW (ref 36.0–46.0)
Hemoglobin: 9.4 g/dL — ABNORMAL LOW (ref 12.0–15.0)
LYMPHS PCT: 12 %
Lymphs Abs: 1 10*3/uL (ref 0.7–4.0)
MCH: 24.3 pg — ABNORMAL LOW (ref 26.0–34.0)
MCHC: 29.3 g/dL — ABNORMAL LOW (ref 30.0–36.0)
MCV: 82.9 fL (ref 78.0–100.0)
MONO ABS: 0.5 10*3/uL (ref 0.1–1.0)
Monocytes Relative: 7 %
Neutro Abs: 6.1 10*3/uL (ref 1.7–7.7)
Neutrophils Relative %: 78 %
PLATELETS: 250 10*3/uL (ref 150–400)
RBC: 3.87 MIL/uL (ref 3.87–5.11)
RDW: 17.8 % — AB (ref 11.5–15.5)
WBC: 7.8 10*3/uL (ref 4.0–10.5)

## 2017-12-07 LAB — GLUCOSE, CAPILLARY: GLUCOSE-CAPILLARY: 120 mg/dL — AB (ref 65–99)

## 2017-12-07 LAB — BRAIN NATRIURETIC PEPTIDE: B Natriuretic Peptide: 155 pg/mL — ABNORMAL HIGH (ref 0.0–100.0)

## 2017-12-07 LAB — I-STAT CG4 LACTIC ACID, ED
LACTIC ACID, VENOUS: 1.09 mmol/L (ref 0.5–1.9)
LACTIC ACID, VENOUS: 0.51 mmol/L (ref 0.5–1.9)

## 2017-12-07 LAB — TROPONIN I: Troponin I: <0.03 ng/mL (ref <0.03)

## 2017-12-07 MED ORDER — SODIUM CHLORIDE 0.9 % IV SOLN: 250.0000 mL | INTRAVENOUS | Status: DC | PRN

## 2017-12-07 MED ORDER — ALBUTEROL SULFATE (2.5 MG/3ML) 0.083% IN NEBU
5.0000 mg | INHALATION_SOLUTION | Freq: Once | RESPIRATORY_TRACT | Status: AC
  Administered 2017-12-07: 5 mg via RESPIRATORY_TRACT
  Filled 2017-12-07: qty 6

## 2017-12-07 MED ORDER — FUROSEMIDE 10 MG/ML IJ SOLN
40.0000 mg | Freq: Two times a day (BID) | INTRAMUSCULAR | Status: DC
  Administered 2017-12-07: 40 mg via INTRAVENOUS
  Filled 2017-12-07 (×2): qty 4

## 2017-12-07 MED ORDER — LINACLOTIDE 145 MCG PO CAPS
145.0000 ug | ORAL_CAPSULE | Freq: Every day | ORAL | Status: DC
  Administered 2017-12-08 – 2017-12-10 (×3): 145 ug via ORAL
  Filled 2017-12-07 (×3): qty 1

## 2017-12-07 MED ORDER — IPRATROPIUM-ALBUTEROL 0.5-2.5 (3) MG/3ML IN SOLN
3.0000 mL | Freq: Four times a day (QID) | RESPIRATORY_TRACT | Status: DC
  Administered 2017-12-07 – 2017-12-10 (×8): 3 mL via RESPIRATORY_TRACT
  Filled 2017-12-07 (×9): qty 3

## 2017-12-07 MED ORDER — FUROSEMIDE 10 MG/ML IJ SOLN
40.0000 mg | INTRAMUSCULAR | Status: AC
  Administered 2017-12-07: 40 mg via INTRAVENOUS
  Filled 2017-12-07: qty 4

## 2017-12-07 MED ORDER — INSULIN ASPART 100 UNIT/ML ~~LOC~~ SOLN
0.0000 [IU] | Freq: Three times a day (TID) | SUBCUTANEOUS | Status: DC
  Administered 2017-12-08 – 2017-12-09 (×4): 2 [IU] via SUBCUTANEOUS
  Administered 2017-12-09: 3 [IU] via SUBCUTANEOUS
  Administered 2017-12-09 – 2017-12-10 (×2): 2 [IU] via SUBCUTANEOUS

## 2017-12-07 MED ORDER — TECHNETIUM TC 99M DIETHYLENETRIAME-PENTAACETIC ACID
30.0000 | Freq: Once | INTRAVENOUS | Status: AC | PRN
  Administered 2017-12-07: 33 via RESPIRATORY_TRACT

## 2017-12-07 MED ORDER — ENOXAPARIN SODIUM 40 MG/0.4ML ~~LOC~~ SOLN
40.0000 mg | SUBCUTANEOUS | Status: DC
  Administered 2017-12-07 – 2017-12-09 (×3): 40 mg via SUBCUTANEOUS
  Filled 2017-12-07 (×3): qty 0.4

## 2017-12-07 MED ORDER — PANTOPRAZOLE SODIUM 40 MG PO TBEC
40.0000 mg | DELAYED_RELEASE_TABLET | Freq: Every day | ORAL | Status: DC
Start: 2017-12-07 — End: 2017-12-10
  Administered 2017-12-07 – 2017-12-10 (×4): 40 mg via ORAL
  Filled 2017-12-07 (×4): qty 1

## 2017-12-07 MED ORDER — ASPIRIN 81 MG PO CHEW
81.0000 mg | CHEWABLE_TABLET | Freq: Every day | ORAL | Status: DC
  Administered 2017-12-08 – 2017-12-10 (×3): 81 mg via ORAL
  Filled 2017-12-07 (×3): qty 1

## 2017-12-07 MED ORDER — BUDESONIDE 0.5 MG/2ML IN SUSP
0.5000 mg | Freq: Two times a day (BID) | RESPIRATORY_TRACT | Status: DC
  Administered 2017-12-07 – 2017-12-10 (×6): 0.5 mg via RESPIRATORY_TRACT
  Filled 2017-12-07 (×6): qty 2

## 2017-12-07 MED ORDER — METHYLPREDNISOLONE SODIUM SUCC 125 MG IJ SOLR
INTRAMUSCULAR | Status: AC
  Filled 2017-12-07: qty 2

## 2017-12-07 MED ORDER — FAMOTIDINE 20 MG PO TABS
20.0000 mg | ORAL_TABLET | Freq: Two times a day (BID) | ORAL | Status: DC
  Administered 2017-12-07 – 2017-12-10 (×6): 20 mg via ORAL
  Filled 2017-12-07 (×6): qty 1

## 2017-12-07 MED ORDER — SODIUM CHLORIDE 0.9% FLUSH
3.0000 mL | Freq: Two times a day (BID) | INTRAVENOUS | Status: DC
  Administered 2017-12-07 – 2017-12-10 (×5): 3 mL via INTRAVENOUS

## 2017-12-07 MED ORDER — ACETAMINOPHEN 325 MG PO TABS: 650.0000 mg | ORAL_TABLET | ORAL | Status: DC | PRN

## 2017-12-07 MED ORDER — METHYLPREDNISOLONE SODIUM SUCC 125 MG IJ SOLR
60.0000 mg | Freq: Four times a day (QID) | INTRAMUSCULAR | Status: DC
  Administered 2017-12-07 – 2017-12-09 (×7): 60 mg via INTRAVENOUS
  Filled 2017-12-07 (×7): qty 2

## 2017-12-07 MED ORDER — CLOPIDOGREL BISULFATE 75 MG PO TABS
75.0000 mg | ORAL_TABLET | Freq: Every day | ORAL | Status: DC
  Administered 2017-12-07 – 2017-12-10 (×4): 75 mg via ORAL
  Filled 2017-12-07 (×4): qty 1

## 2017-12-07 MED ORDER — ONDANSETRON HCL 4 MG/2ML IJ SOLN: 4.0000 mg | Freq: Four times a day (QID) | INTRAMUSCULAR | Status: DC | PRN

## 2017-12-07 MED ORDER — NITROGLYCERIN 0.4 MG SL SUBL: 0.4000 mg | SUBLINGUAL_TABLET | SUBLINGUAL | Status: DC | PRN

## 2017-12-07 MED ORDER — TECHNETIUM TO 99M ALBUMIN AGGREGATED
4.0000 | Freq: Once | INTRAVENOUS | Status: AC | PRN
  Administered 2017-12-07: 4.4 via INTRAVENOUS

## 2017-12-07 MED ORDER — METOPROLOL TARTRATE 25 MG PO TABS
12.5000 mg | ORAL_TABLET | Freq: Two times a day (BID) | ORAL | Status: DC
  Administered 2017-12-07 – 2017-12-10 (×6): 12.5 mg via ORAL
  Filled 2017-12-07 (×6): qty 1

## 2017-12-07 MED ORDER — ATORVASTATIN CALCIUM 40 MG PO TABS
80.0000 mg | ORAL_TABLET | Freq: Every day | ORAL | Status: DC
  Administered 2017-12-08 – 2017-12-09 (×2): 80 mg via ORAL
  Filled 2017-12-07 (×2): qty 2

## 2017-12-07 MED ORDER — INSULIN ASPART 100 UNIT/ML ~~LOC~~ SOLN: 0.0000 [IU] | Freq: Every day | SUBCUTANEOUS | Status: DC

## 2017-12-07 MED ORDER — SODIUM CHLORIDE 0.9% FLUSH: 3.0000 mL | INTRAVENOUS | Status: DC | PRN

## 2017-12-07 MED ORDER — BUPROPION HCL ER (SR) 150 MG PO TB12
150.0000 mg | ORAL_TABLET | Freq: Two times a day (BID) | ORAL | Status: DC
  Administered 2017-12-07 – 2017-12-10 (×6): 150 mg via ORAL
  Filled 2017-12-07 (×6): qty 1

## 2017-12-07 MED ORDER — OMEGA-3-ACID ETHYL ESTERS 1 G PO CAPS
2.0000 g | ORAL_CAPSULE | Freq: Two times a day (BID) | ORAL | Status: DC
  Administered 2017-12-07 – 2017-12-10 (×6): 2 g via ORAL
  Filled 2017-12-07 (×6): qty 2

## 2017-12-07 NOTE — ED Provider Notes (Signed)
[facility] EMERGENCY DEPARTMENT Provider Note   CSN: [id number] Arrival date & time: 12/07/17  1144     History   Chief Complaint Chief Complaint  Patient presents with  . Shortness of Breath    HPI Erica [name] is a 67 y.o. female.  HPI  The pt is a 67 y/o female - she has known CHF - and hx of COPD - quit smoking 3 months ago - presents with sister who is the primary historian - states that the pt has had icnraesed sOB over the last 3 days - is on 3 L chronically due to CHF / COPD - has had incraesed cough and swelling, no fevers.  Pt is in acute distress and can't give history due to tachypnea.  Has recently  Had orthopnea and severe DOE.  Level 5 caveat applies 2/2 acute respiratory distress  Review of EMR - mutliple admission in last 3 months - Echo in 12/18, has 60% EF, known diastolic dysfunction  Past Medical History:  Diagnosis Date  . Arthritis   . CHF (congestive heart failure) ([place])   . Chronic back pain   . COPD (chronic obstructive pulmonary disease) (HCC)    2L home O2  . Diabetes mellitus without complication ([place])   . Hypertension   . Stroke (cerebrum) [place])     Patient Active Problem List   Diagnosis Date Noted  . Acute diastolic CHF (congestive heart failure) ([place]) 09/19/2017  . Iron deficiency anemia due to chronic blood loss   . Acute diverticulitis 08/29/2017  . Heme positive stool   . Symptomatic anemia   . GI bleed 08/27/2017  . Elevated troponin 08/27/2017  . (HFpEF) heart failure with preserved ejection fraction ([place]) 08/27/2017  . Acute dyspnea   . Acute on chronic respiratory failure ([place]) 02/11/2017  . Acute on chronic diastolic CHF (congestive heart failure) ([place]) 02/11/2017  . COPD exacerbation ([place]) 11/20/2015  . Benign essential HTN 11/20/2015  . GERD (gastroesophageal reflux disease) 11/20/2015  . Tobacco abuse 11/20/2015  . Type 2 diabetes mellitus with hyperglycemia ([place]) 11/20/2015  . COPD exacerbation ([place]) 09/05/2015   . Chronic pain syndrome 09/05/2015  . Hypertension 09/05/2015  . Diabetes mellitus type 2, controlled ([place]) 09/05/2015  . Chronic respiratory failure ([place]) 09/05/2015  . OLECRANON BURSITIS, LEFT 08/04/2010    Past Surgical History:  Procedure Laterality Date  . ABDOMINAL HYSTERECTOMY    . colonoscopy  2009   Dr. Gala Romney: cluster of diminutive rectal polyps and 2 diminutive polyps in rectosigmoid junction, hyperplastic  . COLONOSCOPY WITH PROPOFOL N/A 10/18/2017   Procedure: COLONOSCOPY WITH PROPOFOL;  Surgeon: Daneil Dolin, MD;  Location: [facility] ENDO SUITE;  Service: Endoscopy;  Laterality: N/A;  9:30am  . ESOPHAGOGASTRODUODENOSCOPY (EGD) WITH PROPOFOL N/A 08/29/2017   Dr. Gala Romney with propofol: small hiatal hernia. 4cm fundal diverticulum.   Marland Kitchen left hand middle finger     in an accident at work, amputated    OB History    Gravida Para Term Preterm AB Living   0 0 0 0 0     SAB TAB Ectopic Multiple Live Births   0 0 0           Home Medications    Prior to Admission medications   Medication Sig Start Date End Date Taking? Authorizing Provider  amLODipine (NORVASC) 5 MG tablet Take 1 tablet (5 mg total) by mouth daily. 08/31/17   Orson Eva, MD  aspirin 81 MG tablet Take 2 tablets by mouth  daily.  10/22/15   [provider]  atorvastatin (LIPITOR) 80 MG tablet Take 80 mg by mouth daily.  12/04/16   [provider]  budesonide-formoterol (SYMBICORT) 160-4.5 MCG/ACT inhaler Inhale 2 puffs into the lungs 2 (two) times daily. 11/23/15   Barton Dubois, MD  buPROPion [facility] SR) 150 MG 12 hr tablet Take 150 mg by mouth 2 (two) times daily. 12/04/16   [provider]  clopidogrel (PLAVIX) 75 MG tablet Take 75 mg by mouth daily.    [provider]  famotidine (PEPCID) 20 MG tablet Take 20 mg by mouth 2 (two) times daily.    [provider]  ipratropium-albuterol (DUONEB) 0.5-2.5 (3) MG/3ML SOLN Take 3 mLs by nebulization every 6 (six) hours as  needed (for shortness of breath).    [provider]  Linaclotide Rolan Lipa) 145 MCG CAPS capsule Take 145 mcg by mouth daily.    [provider]  metoprolol tartrate (LOPRESSOR) 25 MG tablet Take 0.5 tablets (12.5 mg total) by mouth 2 (two) times daily. 08/30/17   Orson Eva, MD  omega-3 acid ethyl esters (LOVAZA) 1 g capsule Take 2 g by mouth 2 (two) times daily.  12/07/16   [provider]  omeprazole (PRILOSEC) 20 MG capsule Take 20 mg by mouth daily. 12/04/16   [provider]  OXYGEN Inhale 2 L into the lungs continuous.    [provider]  saxagliptin HCl (ONGLYZA) 5 MG TABS tablet Take 5 mg by mouth daily. Reported on 09/22/2015    [provider]  SPIRIVA HANDIHALER 18 MCG inhalation capsule Take 1 puff by mouth daily. 10/31/17   [provider]  valsartan-hydrochlorothiazide (DIOVAN-HCT) 160-25 MG tablet Take 1 tablet by mouth daily. 09/24/17   [provider]    Family History Family History  Problem Relation Age of Onset  . CVA Mother 6  . Cancer Neg Hx   . Colon cancer Neg Hx     Social History Social History   Tobacco Use  . Smoking status: Former Smoker    Packs/day: 1.00    Years: 52.00    Pack years: 52.00    Types: Cigarettes    Last attempt to quit: 09/23/2017    Years since quitting: 0.2  . Smokeless tobacco: Never Used  Substance Use Topics  . Alcohol use: No  . Drug use: No     Allergies   Patient has no known allergies.   Review of Systems Review of Systems  Unable to perform ROS: Acuity of condition     Physical Exam Updated Vital Signs BP (!) 140/55   Pulse 69   Temp 98 F (36.7 C) (Oral)   Resp (!) 25   Ht 5\' 4"  (1.626 m)   Wt 81.6 kg (180 lb)   SpO2 93%   BMI 30.90 kg/m   Physical Exam  Constitutional: She appears well-developed and well-nourished. She appears distressed.  HENT:  Head: Normocephalic and atraumatic.  Mouth/Throat: Oropharynx is clear and moist.  No oropharyngeal exudate.  Eyes: Conjunctivae and EOM are normal. Pupils are equal, round, and reactive to light. Right eye exhibits no discharge. Left eye exhibits no discharge. No scleral icterus.  Neck: Normal range of motion. Neck supple. No JVD present. No thyromegaly present.  Cardiovascular: Normal rate, regular rhythm and intact distal pulses. Exam reveals no gallop and no friction rub.  Murmur heard. JVD  Pulmonary/Chest: She is in respiratory distress. She has wheezes. She has rales.  Increased WOB, acute  resp distress, accessory muscle use - rales and wheezing diffusely  Abdominal: Soft. Bowel sounds are normal. She exhibits no distension and no mass. There is no tenderness.  Musculoskeletal: Normal range of motion. She exhibits edema ( bilatearl ankles - edemetaus to mid calf - symmetrical). She exhibits no tenderness.  Lymphadenopathy:    She has no cervical adenopathy.  Neurological: She is alert. Coordination normal.  Skin: Skin is warm. No rash noted. She is diaphoretic. No erythema.  Psychiatric: She has a normal mood and affect. Her behavior is normal.  Nursing note and vitals reviewed.    ED Treatments / Results  Labs (all labs ordered are listed, but only abnormal results are displayed) Labs Reviewed  BRAIN NATRIURETIC PEPTIDE - Abnormal; Notable for the following components:      Result Value   B Natriuretic Peptide 155.0 (*)    All other components within normal limits  CBC WITH DIFFERENTIAL/PLATELET - Abnormal; Notable for the following components:   Hemoglobin 9.4 (*)    HCT 32.1 (*)    MCH 24.3 (*)    MCHC 29.3 (*)    RDW 17.8 (*)    All other components within normal limits  COMPREHENSIVE METABOLIC PANEL - Abnormal; Notable for the following components:   Glucose, Bld 167 (*)    BUN 28 (*)    Creatinine, Ser 1.70 (*)    Calcium 8.7 (*)    Albumin 3.3 (*)    AST 10 (*)    ALT 9 (*)    GFR calc non Af Amer 30 (*)    GFR calc Af Amer 35 (*)    All  other components within normal limits  TROPONIN I  I-STAT CG4 LACTIC ACID, ED  I-STAT CG4 LACTIC ACID, ED    EKG  EKG Interpretation  Date/Time:  Friday December 07 2017 12:38:07 EDT Ventricular Rate:  68 PR Interval:    QRS Duration: 105 QT Interval:  422 QTC Calculation: 449 R Axis:   -15 Text Interpretation:  Sinus rhythm Inferior infarct, old ST elevation consistent with prior - new from last visit as are Q waves inferior Left anterior fasicular block , new otherwise unremarkable Confirmed by Noemi Chapel [phone]) on 12/07/2017 12:45:38 PM       Radiology Dg Chest Portable 1 View  Result Date: 12/07/2017 CLINICAL DATA:  Increased shortness of breath with productive cough. EXAM: PORTABLE CHEST 1 VIEW COMPARISON:  Chest x-ray dated September 19, 2017. FINDINGS: Stable cardiomegaly. Diffusely increased interstitial markings. Hazy opacities at both lung bases. Small bilateral pleural effusions. No consolidation or pneumothorax. No acute osseous abnormality. IMPRESSION: Diffuse interstitial and lower lobe predominant alveolar pulmonary edema with small bilateral pleural effusions. Electronically Signed   By: Titus Dubin M.D.   On: 12/07/2017 12:17    Procedures .Critical Care Performed by: Noemi Chapel, MD Authorized by: Noemi Chapel, MD   Critical care provider statement:    Critical care time (minutes):  35   Critical care time was exclusive of:  Separately billable procedures and treating other patients and teaching time   Critical care was necessary to treat or prevent imminent or life-threatening deterioration of the following conditions:  Cardiac failure and respiratory failure   Critical care was time spent personally by me on the following activities:  Blood draw for specimens, development of treatment plan with patient or surrogate, discussions with consultants, evaluation of patient's response to treatment, examination of patient, obtaining history from patient or  surrogate, ordering and performing treatments  and interventions, ordering and review of laboratory studies, ordering and review of radiographic studies, pulse oximetry, re-evaluation of patient's condition and review of old charts    (including critical care time)  Medications Ordered in ED Medications  methylPREDNISolone sodium succinate (SOLU-MEDROL) 125 mg/2 mL injection (not administered)  nitroGLYCERIN (NITROSTAT) SL tablet 0.4 mg (not administered)  albuterol (PROVENTIL) (2.5 MG/3ML) 0.083% nebulizer solution 5 mg (5 mg Nebulization Given 12/07/17 1219)  furosemide (LASIX) injection 40 mg (40 mg Intravenous Given 12/07/17 1330)     Initial Impression / Assessment and Plan / ED Course  I have reviewed the triage vital signs and the nursing notes.  Pertinent labs & imaging results that were available during my care of the patient were reviewed by me and considered in my medical decision making (see chart for details).      Acute resp distres, consider COPD vs Emphysema vs CHF vs PNA  Fluid overloaded suggestive of CHF  Lasix, nitro, labs, xray,  bipap for hypoxia - 79% on 5L, and still tachpneic  Needs high level of care.  Critically ill   bipap required - ongoing distress but improved CXR with edema Labs without obvsiou source VQ ordered D/w hospitalist Dr. Carles Collet who will kindly admit.  Final Clinical Impressions(s) / ED Diagnoses   Final diagnoses:  Acute pulmonary edema ([place])  Respiratory distress      Noemi Chapel, MD 12/07/17 1458

## 2017-12-07 NOTE — H&P (Signed)
History and Physical  Erica [name] OHY:[id number] DOB: April 28, 1951 DOA: 12/07/2017   PCP: Iona Beard, MD   Patient coming from: Home  Chief Complaint: dyspnea  HPI:  Erica [name] is a 67 y.o. female with medical history of 67 year old female with a history of COPD, chronic respiratory failure on 2 L, SVT, diastolic CHF, CKD stage III, and diabetes mellitus presenting with 3-day history of worsening shortness of breath.  Upon arrival, the patient was in respiratory distress with oxygen saturation in the 70s.  The patient was placed on BiPAP.  The patient denied any fevers, chills, chest pain, coughing, hemoptysis, nausea, vomiting, diarrhea, abdominal pain, hematochezia, melena, dysuria, hematuria, headaches.  She noted increasing lower extremity edema and orthopnea for the past 3 days.  She has not on any type of fluid restriction nor does she weigh herself.  In the emergency department, The patient was placed on BiPAP initially.  Chest x-ray revealed diffuse interstitial markings and pulmonary edema with small bilateral pleural effusions.  She was started on intravenous furosemide 40 mg IV x1 with improvement of her respiratory status.  BMP showed serum creatinine of 1.70 which is above her baseline.  LFTs and CBC were unremarkable.  Hemoglobin was at baseline 9.4.  Lactic acid was 1.09.  EKG shows sinus rhythm with nonspecific ST changes.  Assessment/Plan: Acute on chronic respiratory failure with hypoxia -Secondary COPD exacerbation and CHF exacerbation -Initially placed on BiPAP -Now presently stable on 4 L -Wean back to home 2 L  Acute on chronic diastolic CHF -daily weights -accurate I/O's -fluid restrict -Echo -continue IV Lasix 40 mg BID  COPD exacerbation -Start Solu-Medrol IV -Start duo nebs -Add Pulmicort -quit smoking 3 weeks PTA after 25 pk years  Diabetes mellitus type 2 -NovoLog sliding scale -Hemoglobin A1c  Acute on chronic renal failure--CKD stage  III -Baseline creatinine 1.1-1.4 -Monitor BMP with diuresis  Essential hypertension -Holding valsartan/HCTZ to allow more room for diuresis -Holding amlodipine -Continue metoprolol tartrate  History of SVT -Presently in sinus rhythm -Continue metoprolol tartrate        Past Medical History:  Diagnosis Date  . Arthritis   . CHF (congestive heart failure) ([place])   . Chronic back pain   . COPD (chronic obstructive pulmonary disease) (HCC)    2L home O2  . Diabetes mellitus without complication ([place])   . Hypertension   . Stroke (cerebrum) [facility])    Past Surgical History:  Procedure Laterality Date  . ABDOMINAL HYSTERECTOMY    . colonoscopy  2009   Dr. Gala Romney: cluster of diminutive rectal polyps and 2 diminutive polyps in rectosigmoid junction, hyperplastic  . COLONOSCOPY WITH PROPOFOL N/A 10/18/2017   Procedure: COLONOSCOPY WITH PROPOFOL;  Surgeon: Daneil Dolin, MD;  Location: [facility] ENDO SUITE;  Service: Endoscopy;  Laterality: N/A;  9:30am  . ESOPHAGOGASTRODUODENOSCOPY (EGD) WITH PROPOFOL N/A 08/29/2017   Dr. Gala Romney with propofol: small hiatal hernia. 4cm fundal diverticulum.   Marland Kitchen left hand middle finger     in an accident at work, amputated   Social History:  reports that she quit smoking about 2 months ago. Her smoking use included cigarettes. She has a 52.00 pack-year smoking history. she has never used smokeless tobacco. She reports that she does not drink alcohol or use drugs.   Family History  Problem Relation Age of Onset  . CVA Mother 34  . Cancer Neg Hx   . Colon cancer Neg Hx      No  Known Allergies   Prior to Admission medications   Medication Sig Start Date End Date Taking? Authorizing Provider  amLODipine (NORVASC) 5 MG tablet Take 1 tablet (5 mg total) by mouth daily. 08/31/17   Orson Eva, MD  aspirin 81 MG tablet Take 2 tablets by mouth daily.  10/22/15   [provider]  atorvastatin (LIPITOR) 80 MG tablet Take 80 mg by mouth daily.  12/04/16    [provider]  budesonide-formoterol (SYMBICORT) 160-4.5 MCG/ACT inhaler Inhale 2 puffs into the lungs 2 (two) times daily. 11/23/15   Barton Dubois, MD  buPROPion [facility] SR) 150 MG 12 hr tablet Take 150 mg by mouth 2 (two) times daily. 12/04/16   [provider]  clopidogrel (PLAVIX) 75 MG tablet Take 75 mg by mouth daily.    [provider]  famotidine (PEPCID) 20 MG tablet Take 20 mg by mouth 2 (two) times daily.    [provider]  ipratropium-albuterol (DUONEB) 0.5-2.5 (3) MG/3ML SOLN Take 3 mLs by nebulization every 6 (six) hours as needed (for shortness of breath).    [provider]  Linaclotide Rolan Lipa) 145 MCG CAPS capsule Take 145 mcg by mouth daily.    [provider]  metoprolol tartrate (LOPRESSOR) 25 MG tablet Take 0.5 tablets (12.5 mg total) by mouth 2 (two) times daily. 08/30/17   Orson Eva, MD  omega-3 acid ethyl esters (LOVAZA) 1 g capsule Take 2 g by mouth 2 (two) times daily.  12/07/16   [provider]  omeprazole (PRILOSEC) 20 MG capsule Take 20 mg by mouth daily. 12/04/16   [provider]  OXYGEN Inhale 2 L into the lungs continuous.    [provider]  saxagliptin HCl (ONGLYZA) 5 MG TABS tablet Take 5 mg by mouth daily. Reported on 09/22/2015    [provider]  SPIRIVA HANDIHALER 18 MCG inhalation capsule Take 1 puff by mouth daily. 10/31/17   [provider]  valsartan-hydrochlorothiazide (DIOVAN-HCT) 160-25 MG tablet Take 1 tablet by mouth daily. 09/24/17   [provider]    Review of Systems:  Constitutional:  No weight loss, night sweats, Fevers, chills, fatigue.  Head&Eyes: No headache.  No vision loss.  No eye pain or scotoma ENT:  No Difficulty swallowing,Tooth/dental problems,Sore throat,  No ear ache, post nasal drip,  Cardio-vascular:  No chest pain,  dizziness, palpitations  GI:  No  abdominal pain, nausea, vomiting, diarrhea, loss of  appetite, hematochezia, melena, heartburn, indigestion, Resp:   No cough. No coughing up of blood .No wheezing.No chest wall deformity  Skin:  no rash or lesions.  GU:  no dysuria, change in color of urine, no urgency or frequency. No flank pain.  Musculoskeletal:  No joint pain or swelling. No decreased range of motion. No back pain.  Psych:  No change in mood or affect. No depression or anxiety. Neurologic: No headache, no dysesthesia, no focal weakness, no vision loss. No syncope  Physical Exam: Vitals:   12/07/17 1248 12/07/17 1300 12/07/17 1330 12/07/17 1400  BP: 106/70 (!) 127/51 (!) 135/53 (!) 140/55  Pulse: 79 75 67 69  Resp: 16 (!) 25 (!) 23 (!) 25  Temp:      TempSrc:      SpO2: 100% 100% 96% 93%  Weight:      Height:       General:  A&O x 3, NAD, nontoxic, pleasant/cooperative Head/Eye: No conjunctival hemorrhage, no icterus, [place]/AT, No nystagmus ENT:  No icterus,  No thrush,  good dentition, no pharyngeal exudate Neck:  No masses, no lymphadenpathy, no bruits CV:  RRR, no rub, no gallop, no S3 Lung: Bilateral crackles.  Bilateral scattered rhonchi.  Bilateral expiratory wheeze.  Good air movement. Abdomen: soft/NT, +BS, nondistended, no peritoneal signs Ext: No cyanosis, No rashes, No petechiae, No lymphangitis, 2 + LE edema Neuro: CNII-XII intact, strength 4/5 in bilateral upper and lower extremities, no dysmetria  Labs on Admission:  Basic Metabolic Panel: Recent Labs  Lab 12/07/17 1219  NA 141  K 4.0  CL 104  CO2 26  GLUCOSE 167*  BUN 28*  CREATININE 1.70*  CALCIUM 8.7*   Liver Function Tests: Recent Labs  Lab 12/07/17 1219  AST 10*  ALT 9*  ALKPHOS 90  BILITOT 0.3  PROT 6.8  ALBUMIN 3.3*   No results for input(s): LIPASE, AMYLASE in the last 168 hours. No results for input(s): AMMONIA in the last 168 hours. CBC: Recent Labs  Lab 12/07/17 1219  WBC 7.8  NEUTROABS 6.1  HGB 9.4*  HCT 32.1*  MCV 82.9  PLT 250   Coagulation  Profile: No results for input(s): INR, PROTIME in the last 168 hours. Cardiac Enzymes: Recent Labs  Lab 12/07/17 1219  TROPONINI <0.03   BNP: Invalid input(s): POCBNP CBG: No results for input(s): GLUCAP in the last 168 hours. Urine analysis:    Component Value Date/Time   COLORURINE YELLOW 03/14/2013 1847   APPEARANCEUR CLEAR 03/14/2013 1847   LABSPEC 1.025 03/14/2013 1847   PHURINE 6.0 03/14/2013 1847   GLUCOSEU NEGATIVE 03/14/2013 1847   HGBUR NEGATIVE 03/14/2013 1847   BILIRUBINUR NEGATIVE 03/14/2013 1847   KETONESUR NEGATIVE 03/14/2013 1847   PROTEINUR NEGATIVE 03/14/2013 1847   UROBILINOGEN 0.2 03/14/2013 1847   NITRITE NEGATIVE 03/14/2013 1847   LEUKOCYTESUR NEGATIVE 03/14/2013 1847   Sepsis Labs: @LABRCNTIP (procalcitonin:4,lacticidven:4) )No results found for this or any previous visit (from the past 240 hour(s)).   Radiological Exams on Admission: Dg Chest Portable 1 View  Result Date: 12/07/2017 CLINICAL DATA:  Increased shortness of breath with productive cough. EXAM: PORTABLE CHEST 1 VIEW COMPARISON:  Chest x-ray dated September 19, 2017. FINDINGS: Stable cardiomegaly. Diffusely increased interstitial markings. Hazy opacities at both lung bases. Small bilateral pleural effusions. No consolidation or pneumothorax. No acute osseous abnormality. IMPRESSION: Diffuse interstitial and lower lobe predominant alveolar pulmonary edema with small bilateral pleural effusions. Electronically Signed   By: Titus Dubin M.D.   On: 12/07/2017 12:17    EKG: Independently reviewed.  Sinus rhythm, nonspecific ST changes    Time spent:60 minutes Code Status:   FULL Family Communication:  No Family at bedside Disposition Plan: expect 2-3 day hospitalization Consults called: none DVT Prophylaxis: [place] Lovenox  Orson Eva, DO  [facility] Pager [phone]  If 7PM-7AM, please contact night-coverage [email or web address] Password [facility] 12/07/2017, 3:03 PM

## 2017-12-07 NOTE — ED Notes (Signed)
Patient taken off of Bipap per Dr. Carles Collet.

## 2017-12-07 NOTE — ED Notes (Addendum)
Patient resting off of Bipap.

## 2017-12-07 NOTE — ED Notes (Signed)
Patient transported to Imaging for Pulmonary Perf.

## 2017-12-07 NOTE — ED Triage Notes (Signed)
Pt reports worsening SOB and productive yellow cough x 3 days. Pt reports recent illness. Hx of COPD, wears 2L at home.

## 2017-12-08 LAB — GLUCOSE, CAPILLARY
GLUCOSE-CAPILLARY: 195 mg/dL — AB (ref 65–99)
Glucose-Capillary: 146 mg/dL — ABNORMAL HIGH (ref 65–99)
Glucose-Capillary: 174 mg/dL — ABNORMAL HIGH (ref 65–99)
Glucose-Capillary: 170 mg/dL — ABNORMAL HIGH (ref 65–99)

## 2017-12-08 LAB — HEMOGLOBIN A1C
Hgb A1c MFr Bld: 4.7 % — ABNORMAL LOW (ref 4.8–5.6)
MEAN PLASMA GLUCOSE: 88.19 mg/dL

## 2017-12-08 LAB — BASIC METABOLIC PANEL
Anion gap: 11 (ref 5–15)
BUN: 30 mg/dL — ABNORMAL HIGH (ref 6–20)
CHLORIDE: 103 mmol/L (ref 101–111)
CO2: 29 mmol/L (ref 22–32)
CREATININE: 1.71 mg/dL — AB (ref 0.44–1.00)
Calcium: 8.9 mg/dL (ref 8.9–10.3)
GFR calc Af Amer: 34 mL/min — ABNORMAL LOW (ref >60)
GFR, EST NON AFRICAN AMERICAN: 30 mL/min — AB (ref >60)
Glucose, Bld: 167 mg/dL — ABNORMAL HIGH (ref 65–99)
POTASSIUM: 5 mmol/L (ref 3.5–5.1)
Sodium: 143 mmol/L (ref 135–145)

## 2017-12-08 MED ORDER — FUROSEMIDE 40 MG PO TABS
40.0000 mg | ORAL_TABLET | Freq: Every day | ORAL | Status: DC
  Administered 2017-12-09 – 2017-12-10 (×2): 40 mg via ORAL
  Filled 2017-12-08 (×2): qty 1

## 2017-12-08 MED ORDER — FUROSEMIDE 10 MG/ML IJ SOLN
40.0000 mg | Freq: Once | INTRAMUSCULAR | Status: AC
  Administered 2017-12-08: 40 mg via INTRAVENOUS

## 2017-12-08 NOTE — Progress Notes (Signed)
PROGRESS NOTE  Erica [name]:[id number] DOB: March 13, 1951 DOA: 12/07/2017 PCP: Iona Beard, MD  Brief History:  67 year old female with a history of COPD, chronic respiratory failure on 2 L, SVT, diastolic CHF, CKD stage III, and diabetes mellitus presenting with 3-day history of worsening shortness of breath.  Upon arrival, the patient was in respiratory distress with oxygen saturation in the 70s.  The patient was placed on BiPAP.  The patient denied any fevers, chills, chest pain, coughing, hemoptysis, nausea, vomiting, diarrhea, abdominal pain, hematochezia, melena, dysuria, hematuria, headaches.  She noted increasing lower extremity edema and orthopnea for the past 3 days.  She has not on any type of fluid restriction nor does she weigh herself.  In the emergency department, The patient was placed on BiPAP initially.  Chest x-ray revealed diffuse interstitial markings and pulmonary edema with small bilateral pleural effusions.  She was started on intravenous furosemide 40 mg IV x1 with improvement of her respiratory status.    She was continued on IV  Lasix and started on IV steroids with clinical improvement.  She was weaned off of BiPAP.    Assessment/Plan: Acute on chronic respiratory failure with hypoxia -Secondary COPD exacerbation and CHF exacerbation -Initially placed on BiPAP -Now presently stable on 4 L -Wean back to home 2 L -V/Q scan neg  Acute on chronic diastolic CHF -daily weights--NEG 1 lb -accurate I/O's--not complete -fluid restrict -08/29/17 Echo--EF 60-65%, no WMA -continue IV Lasix 40 mg--decrease to once daily  COPD exacerbation -continue Solu-Medrol IV -Continue duo nebs -Continue Pulmicort -quit smoking 3 weeks PTA after 25 pk years  Diabetes mellitus type 2 -NovoLog sliding scale -Hemoglobin A1c--pending  Acute on chronic renal failure--CKD stage III -Baseline creatinine 1.1-1.4 -Monitor BMP with diuresis -presenting creatinine  1.70  Essential hypertension -Holding valsartan/HCTZ to allow more room for diuresis -Holding amlodipine -Continue metoprolol tartrate  SVT -Presently in sinus rhythm -Continue metoprolol tartrate    Disposition Plan:   Home in 1-2 days when resp status improves  Family Communication:  No Family at bedside  Consultants:  none  Code Status:  FULL  DVT Prophylaxis:  [place] Lovenox   Procedures: As Listed in Progress Note Above  Antibiotics: None    Subjective: Patient is breathing better but she is still short of breath with minimal exertion.  Denies any chest pain, nausea, vomiting, diarrhea, abdominal pain, dysuria, hematuria.  There is no fevers or chills.  She has a cough with yellow sputum.  Objective: Vitals:   12/07/17 2044 12/08/17 0500 12/08/17 0600 12/08/17 0849  BP: (!) 140/56  137/64   Pulse: 81     Resp: (!) 21  18   Temp: 97.7 F (36.5 C)  98.1 F (36.7 C)   TempSrc: Oral  Oral   SpO2: 91%  95% 96%  Weight: 82.4 kg (181 lb 10.5 oz) 81.7 kg (180 lb 1.6 oz)    Height: 5\' 4"  (1.626 m)       Intake/Output Summary (Last 24 hours) at 12/08/2017 1040 Last data filed at 12/07/2017 1545 Gross per 24 hour  Intake -  Output 350 ml  Net -350 ml   Weight change:  Exam:   General:  Pt is alert, follows commands appropriately, not in acute distress  HEENT: No icterus, No thrush, No neck mass, [place]/AT  Cardiovascular: RRR, S1/S2, no rubs, no gallops  Respiratory: Bilateral scattered rhonchi with wheezing.  Bibasilar crackles.  Abdomen: Soft/+BS, non tender,  non distended, no guarding  Extremities: trace LE edema, No lymphangitis, No petechiae, No rashes, no synovitis   Data Reviewed: I have personally reviewed following labs and imaging studies Basic Metabolic Panel: Recent Labs  Lab 12/07/17 1219 12/08/17 0732  NA 141 143  K 4.0 5.0  CL 104 103  CO2 26 29  GLUCOSE 167* 167*  BUN 28* 30*  CREATININE 1.70* 1.71*  CALCIUM 8.7* 8.9   Liver  Function Tests: Recent Labs  Lab 12/07/17 1219  AST 10*  ALT 9*  ALKPHOS 90  BILITOT 0.3  PROT 6.8  ALBUMIN 3.3*   No results for input(s): LIPASE, AMYLASE in the last 168 hours. No results for input(s): AMMONIA in the last 168 hours. Coagulation Profile: No results for input(s): INR, PROTIME in the last 168 hours. CBC: Recent Labs  Lab 12/07/17 1219  WBC 7.8  NEUTROABS 6.1  HGB 9.4*  HCT 32.1*  MCV 82.9  PLT 250   Cardiac Enzymes: Recent Labs  Lab 12/07/17 1219  TROPONINI <0.03   BNP: Invalid input(s): POCBNP CBG: Recent Labs  Lab 12/07/17 2055 12/08/17 0851  GLUCAP 120* 195*   HbA1C: No results for input(s): HGBA1C in the last 72 hours. Urine analysis:    Component Value Date/Time   COLORURINE YELLOW 03/14/2013 1847   APPEARANCEUR CLEAR 03/14/2013 1847   LABSPEC 1.025 03/14/2013 1847   PHURINE 6.0 03/14/2013 1847   GLUCOSEU NEGATIVE 03/14/2013 1847   HGBUR NEGATIVE 03/14/2013 1847   BILIRUBINUR NEGATIVE 03/14/2013 1847   KETONESUR NEGATIVE 03/14/2013 1847   PROTEINUR NEGATIVE 03/14/2013 1847   UROBILINOGEN 0.2 03/14/2013 1847   NITRITE NEGATIVE 03/14/2013 1847   LEUKOCYTESUR NEGATIVE 03/14/2013 1847   Sepsis Labs: @LABRCNTIP (procalcitonin:4,lacticidven:4) )No results found for this or any previous visit (from the past 240 hour(s)).   Scheduled Meds: . aspirin  81 mg Oral Daily  . atorvastatin  80 mg Oral q1800  . budesonide (PULMICORT) nebulizer solution  0.5 mg Nebulization BID  . buPROPion  150 mg Oral BID  . clopidogrel  75 mg Oral Daily  . enoxaparin (LOVENOX) injection  40 mg Subcutaneous Q24H  . famotidine  20 mg Oral BID  . furosemide  40 mg Intravenous Once  . [START ON 12/09/2017] furosemide  40 mg Oral Daily  . insulin aspart  0-5 Units Subcutaneous QHS  . insulin aspart  0-9 Units Subcutaneous TID WC  . ipratropium-albuterol  3 mL Nebulization Q6H  . linaclotide  145 mcg Oral QAC breakfast  . methylPREDNISolone (SOLU-MEDROL)  injection  60 mg Intravenous Q6H  . metoprolol tartrate  12.5 mg Oral BID  . omega-3 acid ethyl esters  2 g Oral BID  . pantoprazole  40 mg Oral Daily  . sodium chloride flush  3 mL Intravenous Q12H   Continuous Infusions: . sodium chloride      Procedures/Studies: Nm Pulmonary Vent And Perf (v/q Scan)  Result Date: 12/07/2017 CLINICAL DATA:  Initial evaluation for 1 day history of shortness of breath. History of CHF. EXAM: NUCLEAR MEDICINE VENTILATION - PERFUSION LUNG SCAN TECHNIQUE: Ventilation images were obtained in multiple projections using inhaled aerosol Tc-19m DTPA. Perfusion images were obtained in multiple projections after intravenous injection of Tc-40m-MAA. RADIOPHARMACEUTICALS:  33 mCi of Tc-108m DTPA aerosol inhalation and 4.4 mCi Tc50m-MAA IV COMPARISON:  Prior chest radiograph from earlier the same day. FINDINGS: Ventilation: No focal ventilation defect. Perfusion: No wedge shaped mismatched peripheral perfusion defects to suggest acute pulmonary embolism. IMPRESSION: Negative VQ scan. No imaging findings to  suggest acute pulmonary embolism identified. Electronically Signed   By: Jeannine Boga M.D.   On: 12/07/2017 17:01   Dg Chest Portable 1 View  Result Date: 12/07/2017 CLINICAL DATA:  Increased shortness of breath with productive cough. EXAM: PORTABLE CHEST 1 VIEW COMPARISON:  Chest x-ray dated September 19, 2017. FINDINGS: Stable cardiomegaly. Diffusely increased interstitial markings. Hazy opacities at both lung bases. Small bilateral pleural effusions. No consolidation or pneumothorax. No acute osseous abnormality. IMPRESSION: Diffuse interstitial and lower lobe predominant alveolar pulmonary edema with small bilateral pleural effusions. Electronically Signed   By: Titus Dubin M.D.   On: 12/07/2017 12:17    Orson Eva, DO  [facility] Pager [phone]  If 7PM-7AM, please contact night-coverage [email or web address] Password [email or web address] 12/08/2017, 10:40 AM   LOS:  1 day

## 2017-12-09 LAB — BASIC METABOLIC PANEL
Anion gap: 13 (ref 5–15)
BUN: 46 mg/dL — AB (ref 6–20)
CALCIUM: 9.1 mg/dL (ref 8.9–10.3)
CHLORIDE: 100 mmol/L — AB (ref 101–111)
CO2: 30 mmol/L (ref 22–32)
CREATININE: 1.83 mg/dL — AB (ref 0.44–1.00)
GFR calc Af Amer: 32 mL/min — ABNORMAL LOW (ref >60)
GFR calc non Af Amer: 27 mL/min — ABNORMAL LOW (ref >60)
GLUCOSE: 176 mg/dL — AB (ref 65–99)
Potassium: 5.5 mmol/L — ABNORMAL HIGH (ref 3.5–5.1)
Sodium: 143 mmol/L (ref 135–145)

## 2017-12-09 LAB — GLUCOSE, CAPILLARY
GLUCOSE-CAPILLARY: 153 mg/dL — AB (ref 65–99)
GLUCOSE-CAPILLARY: 154 mg/dL — AB (ref 65–99)
GLUCOSE-CAPILLARY: 226 mg/dL — AB (ref 65–99)
GLUCOSE-CAPILLARY: 129 mg/dL — AB (ref 65–99)

## 2017-12-09 LAB — MAGNESIUM: Magnesium: 2.1 mg/dL (ref 1.7–2.4)

## 2017-12-09 MED ORDER — PREDNISONE 20 MG PO TABS
60.0000 mg | ORAL_TABLET | Freq: Every day | ORAL | Status: DC
  Administered 2017-12-10: 60 mg via ORAL
  Filled 2017-12-09: qty 3

## 2017-12-09 MED ORDER — METHYLPREDNISOLONE SODIUM SUCC 125 MG IJ SOLR
60.0000 mg | Freq: Four times a day (QID) | INTRAMUSCULAR | Status: AC
  Administered 2017-12-09: 60 mg via INTRAVENOUS
  Filled 2017-12-09: qty 2

## 2017-12-09 MED ORDER — SODIUM POLYSTYRENE SULFONATE 15 GM/60ML PO SUSP
30.0000 g | Freq: Once | ORAL | Status: AC
  Administered 2017-12-09: 30 g via ORAL
  Filled 2017-12-09: qty 120

## 2017-12-09 NOTE — Plan of Care (Signed)
progressing 

## 2017-12-09 NOTE — Progress Notes (Addendum)
PROGRESS NOTE  Erica [name]:[id number] DOB: [birth year] DOA: 12/07/2017 PCP: Iona Beard, MD  Brief History:  67 year old female with a history of COPD, chronic respiratory failure on 2 L, SVT, diastolic CHF, CKD stage III, and diabetes mellitus presenting with 3-day history of worsening shortness of breath. Upon arrival, the patient was in respiratory distress with oxygen saturation in the 70s. The patient was placed on BiPAP. The patient denied any fevers, chills, chest pain, coughing, hemoptysis, nausea, vomiting, diarrhea, abdominal pain, hematochezia, melena, dysuria, hematuria, headaches. She noted increasing lower extremity edema and orthopnea for the past 3 days. She has not on any type of fluid restriction nor does she weigh herself. In the emergency department, The patient was placed on BiPAP initially. Chest x-ray revealed diffuse interstitial markings and pulmonary edema with small bilateral pleural effusions. She was started on intravenous furosemide 40 mg IV x1 with improvement of her respiratory status.   She was continued on IV  Lasix and started on IV steroids with clinical improvement.  She was weaned off of BiPAP.   Assessment/Plan: Acute on chronic respiratory failure with hypoxia -Secondary COPD exacerbation and CHF exacerbation -Initially placed on BiPAP -Now presently stable on 3 L -Wean back to home 2 L -V/Q scan neg  Acute on chronic diastolic CHF -daily weights--NEG 1 lb -accurate I/O's--not complete -fluid restrict -08/29/17 Echo--EF 60-65%, no WMA -change to po lasix -pt endorses dietary indiscretion -long discussion about diet  COPD exacerbation -continue Solu-Medrol IV>>plan to de-escalate to po prednisone 3/18 -Continue duo nebs -Continue Pulmicort -quit smoking 3 weeks PTA after 25 pk years  Diabetes mellitus type 2 -NovoLog sliding scale -12/08/17-Hemoglobin A1c--4.7--question accuracy  Acute on chronic renal  failure--CKD stage III -Baseline creatinine 1.1-1.4 -Monitor BMP with diuresis -presenting creatinine 1.70 -suspect new renal baseline with progression of renal disease  Essential hypertension -Holding valsartan/HCTZ due to worsening renal function -restart amlodipine -Continue metoprolol tartrate  SVT -Presently in sinus rhythm -Continue metoprolol tartrate  Hyperkalemia -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  Greater than 50% spent face to face counseling and coordinating care.   Consultants:  none  Code Status:  FULL  DVT Prophylaxis:  [place] Lovenox   Procedures: As Listed in Progress Note Above  Antibiotics: None      Subjective: Patient is breathing better.  She has some fried chicken that her family brought in for her at the bedside.  She also has a bottle of regular [facility].  She denies any chest pain, nausea, vomiting, diarrhea, abdominal pain, dysuria, headache, neck pain, fevers, chills.  Objective: Vitals:   12/09/17 0500 12/09/17 1006 12/09/17 1336 12/09/17 1410  BP: (!) 141/55   (!) 135/50  Pulse: 71   (!) 57  Resp: 18   18  Temp: 98.7 F (37.1 C)   98 F (36.7 C)  TempSrc: Oral   Oral  SpO2: 95% 97% 97% 99%  Weight: 81.7 kg (180 lb 1.9 oz)     Height:        Intake/Output Summary (Last 24 hours) at 12/09/2017 1544 Last data filed at 12/09/2017 1411 Gross per 24 hour  Intake 723 ml  Output -  Net 723 ml   Weight change: 0.052 kg (1.9 oz) Exam:   General:  Pt is  alert, follows commands appropriately, not in acute distress  HEENT: No icterus, No thrush, No neck mass, [place]/AT  Cardiovascular: RRR, S1/S2, no rubs, no gallops  Respiratory: Bibasilar rales.  Bibasilar crackles.  Bibasilar expiratory wheeze.  Abdomen: Soft/+BS, non tender, non distended, no  guarding  Extremities: No edema, No lymphangitis, No petechiae, No rashes, no synovitis   Data Reviewed: I have personally reviewed following labs and imaging studies Basic Metabolic Panel: Recent Labs  Lab 12/07/17 1219 12/08/17 0732 12/09/17 0624  NA 141 143 143  K 4.0 5.0 5.5*  CL 104 103 100*  CO2 26 29 30   GLUCOSE 167* 167* 176*  BUN 28* 30* 46*  CREATININE 1.70* 1.71* 1.83*  CALCIUM 8.7* 8.9 9.1  MG  --   --  2.1   Liver Function Tests: Recent Labs  Lab 12/07/17 1219  AST 10*  ALT 9*  ALKPHOS 90  BILITOT 0.3  PROT 6.8  ALBUMIN 3.3*   No results for input(s): LIPASE, AMYLASE in the last 168 hours. No results for input(s): AMMONIA in the last 168 hours. Coagulation Profile: No results for input(s): INR, PROTIME in the last 168 hours. CBC: Recent Labs  Lab 12/07/17 1219  WBC 7.8  NEUTROABS 6.1  HGB 9.4*  HCT 32.1*  MCV 82.9  PLT 250   Cardiac Enzymes: Recent Labs  Lab 12/07/17 1219  TROPONINI <0.03   BNP: Invalid input(s): POCBNP CBG: Recent Labs  Lab 12/08/17 1218 12/08/17 1616 12/08/17 2045 12/09/17 0811 12/09/17 1239  GLUCAP 146* 174* 170* 153* 154*   HbA1C: Recent Labs    12/08/17 0732  HGBA1C 4.7*   Urine analysis:    Component Value Date/Time   COLORURINE YELLOW 03/14/2013 1847   APPEARANCEUR CLEAR 03/14/2013 1847   LABSPEC 1.025 03/14/2013 1847   PHURINE 6.0 03/14/2013 1847   GLUCOSEU NEGATIVE 03/14/2013 1847   HGBUR NEGATIVE 03/14/2013 1847   BILIRUBINUR NEGATIVE 03/14/2013 1847   KETONESUR NEGATIVE 03/14/2013 1847   PROTEINUR NEGATIVE 03/14/2013 1847   UROBILINOGEN 0.2 03/14/2013 1847   NITRITE NEGATIVE 03/14/2013 1847   LEUKOCYTESUR NEGATIVE 03/14/2013 1847   Sepsis Labs: @LABRCNTIP (procalcitonin:4,lacticidven:4) )No results found for this or any previous visit (from the past 240 hour(s)).   Scheduled Meds: . aspirin  81 mg Oral Daily  . atorvastatin  80 mg Oral q1800  . budesonide (PULMICORT) nebulizer  solution  0.5 mg Nebulization BID  . buPROPion  150 mg Oral BID  . clopidogrel  75 mg Oral Daily  . enoxaparin (LOVENOX) injection  40 mg Subcutaneous Q24H  . famotidine  20 mg Oral BID  . furosemide  40 mg Oral Daily  . insulin aspart  0-5 Units Subcutaneous QHS  . insulin aspart  0-9 Units Subcutaneous TID WC  . ipratropium-albuterol  3 mL Nebulization Q6H  . linaclotide  145 mcg Oral QAC breakfast  . methylPREDNISolone (SOLU-MEDROL) injection  60 mg Intravenous Q6H  . metoprolol tartrate  12.5 mg Oral BID  . omega-3 acid ethyl esters  2 g Oral BID  . pantoprazole  40 mg Oral Daily  . sodium chloride flush  3 mL Intravenous Q12H  . sodium polystyrene  30 g Oral Once   Continuous Infusions: . sodium chloride      Procedures/Studies: Nm Pulmonary Vent And Perf (v/q Scan)  Result Date: 12/07/2017 CLINICAL DATA:  Initial evaluation for 1 day history of shortness of breath. History of CHF. EXAM: NUCLEAR MEDICINE VENTILATION - PERFUSION LUNG SCAN TECHNIQUE: Ventilation images were obtained in multiple  projections using inhaled aerosol Tc-30m DTPA. Perfusion images were obtained in multiple projections after intravenous injection of Tc-85m-MAA. RADIOPHARMACEUTICALS:  33 mCi of Tc-54m DTPA aerosol inhalation and 4.4 mCi Tc85m-MAA IV COMPARISON:  Prior chest radiograph from earlier the same day. FINDINGS: Ventilation: No focal ventilation defect. Perfusion: No wedge shaped mismatched peripheral perfusion defects to suggest acute pulmonary embolism. IMPRESSION: Negative VQ scan. No imaging findings to suggest acute pulmonary embolism identified. Electronically Signed   By: Jeannine Boga M.D.   On: 12/07/2017 17:01   Dg Chest Portable 1 View  Result Date: 12/07/2017 CLINICAL DATA:  Increased shortness of breath with productive cough. EXAM: PORTABLE CHEST 1 VIEW COMPARISON:  Chest x-ray dated September 19, 2017. FINDINGS: Stable cardiomegaly. Diffusely increased interstitial markings. Hazy  opacities at both lung bases. Small bilateral pleural effusions. No consolidation or pneumothorax. No acute osseous abnormality. IMPRESSION: Diffuse interstitial and lower lobe predominant alveolar pulmonary edema with small bilateral pleural effusions. Electronically Signed   By: Titus Dubin M.D.   On: 12/07/2017 12:17    Orson Eva, DO  [facility] Pager [phone]  If 7PM-7AM, please contact night-coverage [email or web address] Password [email or web address] 12/09/2017, 3:44 PM   LOS: 2 days

## 2017-12-10 DIAGNOSIS — J441 Chronic obstructive pulmonary disease with (acute) exacerbation: Secondary | ICD-10-CM

## 2017-12-10 DIAGNOSIS — J9621 Acute and chronic respiratory failure with hypoxia: Secondary | ICD-10-CM

## 2017-12-10 DIAGNOSIS — I5033 Acute on chronic diastolic (congestive) heart failure: Secondary | ICD-10-CM

## 2017-12-10 DIAGNOSIS — N183 Chronic kidney disease, stage 3 (moderate): Secondary | ICD-10-CM

## 2017-12-10 DIAGNOSIS — N179 Acute kidney failure, unspecified: Secondary | ICD-10-CM

## 2017-12-10 LAB — BASIC METABOLIC PANEL
ANION GAP: 11 (ref 5–15)
BUN: 51 mg/dL — ABNORMAL HIGH (ref 6–20)
CHLORIDE: 97 mmol/L — AB (ref 101–111)
CO2: 33 mmol/L — ABNORMAL HIGH (ref 22–32)
CREATININE: 1.84 mg/dL — AB (ref 0.44–1.00)
Calcium: 8.3 mg/dL — ABNORMAL LOW (ref 8.9–10.3)
GFR calc non Af Amer: 27 mL/min — ABNORMAL LOW (ref >60)
GFR, EST AFRICAN AMERICAN: 31 mL/min — AB (ref >60)
Glucose, Bld: 183 mg/dL — ABNORMAL HIGH (ref 65–99)
Potassium: 3.5 mmol/L (ref 3.5–5.1)
SODIUM: 141 mmol/L (ref 135–145)

## 2017-12-10 LAB — GLUCOSE, CAPILLARY: GLUCOSE-CAPILLARY: 171 mg/dL — AB (ref 65–99)

## 2017-12-10 MED ORDER — PREDNISONE 10 MG PO TABS: 60.0000 mg | ORAL_TABLET | Freq: Every day | ORAL | 0 refills | Status: DC

## 2017-12-10 MED ORDER — FUROSEMIDE 40 MG PO TABS: 40.0000 mg | ORAL_TABLET | Freq: Every day | ORAL | 1 refills | Status: DC

## 2017-12-10 NOTE — Progress Notes (Signed)
Erica [name] discharged Home per MD order.  Discharge instructions reviewed and discussed with the patient, all questions and concerns answered. Copy of instructions and scripts given to patient.  Allergies as of 12/10/2017   No Known Allergies     Medication List    STOP taking these medications   valsartan-hydrochlorothiazide 160-25 MG tablet Commonly known as:  DIOVAN-HCT     TAKE these medications   amLODipine 5 MG tablet Commonly known as:  NORVASC Take 1 tablet (5 mg total) by mouth daily.   aspirin 81 MG tablet Take 2 tablets by mouth daily.   atorvastatin 80 MG tablet Commonly known as:  LIPITOR Take 80 mg by mouth daily.   budesonide-formoterol 160-4.5 MCG/ACT inhaler Commonly known as:  SYMBICORT Inhale 2 puffs into the lungs 2 (two) times daily.   buPROPion 150 MG 12 hr tablet Commonly known as:  WELLBUTRIN SR Take 150 mg by mouth 2 (two) times daily.   clopidogrel 75 MG tablet Commonly known as:  PLAVIX Take 75 mg by mouth daily.   famotidine 20 MG tablet Commonly known as:  PEPCID Take 20 mg by mouth 2 (two) times daily.   furosemide 40 MG tablet Commonly known as:  LASIX Take 1 tablet (40 mg total) by mouth daily. Start taking on:  12/11/2017   ipratropium-albuterol 0.5-2.5 (3) MG/3ML Soln Commonly known as:  DUONEB Take 3 mLs by nebulization every 6 (six) hours as needed (for shortness of breath).   LINZESS 145 MCG Caps capsule Generic drug:  linaclotide Take 145 mcg by mouth daily.   metoprolol tartrate 25 MG tablet Commonly known as:  LOPRESSOR Take 0.5 tablets (12.5 mg total) by mouth 2 (two) times daily.   omega-3 acid ethyl esters 1 g capsule Commonly known as:  LOVAZA Take 2 g by mouth 2 (two) times daily.   omeprazole 20 MG capsule Commonly known as:  PRILOSEC Take 20 mg by mouth daily.   ONGLYZA 5 MG Tabs tablet Generic drug:  saxagliptin HCl Take 5 mg by mouth daily. Reported on 09/22/2015   OXYGEN Inhale 2 L into the  lungs continuous.   predniSONE 10 MG tablet Commonly known as:  DELTASONE Take 6 tablets (60 mg total) by mouth daily with breakfast. And decrease by one tablet daily Start taking on:  12/11/2017   SPIRIVA HANDIHALER 18 MCG inhalation capsule Generic drug:  tiotropium Take 1 puff by mouth daily.       Patients skin is clean, dry and intact, no evidence of skin break down. IV site discontinued and catheter remains intact. Site without signs and symptoms of complications. Dressing and pressure applied.  Patient escorted to car by NT in a wheelchair,  no distress noted upon discharge.  Erica [name] 12/10/2017 11:17 AM

## 2017-12-10 NOTE — Care Management Important Message (Signed)
Important Message  Patient Details  Name: Erica [name] MRN: [id number] Date of Birth: [birth year]   Medicare Important Message Given:  Yes    Sherald Barge, RN 12/10/2017, 10:31 AM

## 2017-12-10 NOTE — Telephone Encounter (Signed)
Never received labs.

## 2017-12-10 NOTE — Care Management Note (Signed)
Case Management Note  Patient Details  Name: Erica [name] MRN: [id number] Date of Birth: 15-Jun-1951  Subjective/Objective:      Admitted with CHF. Pt is from home, ind pta. She has PCP, transportation and insurance with drug coverage. She is on oxygen pta.she is not homebound. She is active with [facility]. Pt non-compliant with diet and smoking cessation.   Action/Plan: DC home today with self care. Not a candidate for [facility] as she is not homebound. Will refer for Emmi calls.   Expected Discharge Date:  12/10/17               Expected Discharge Plan:  Home/Self Care  In-House Referral:  NA  Discharge planning Services  CM Consult  Post Acute Care Choice:  NA Choice offered to:  NA  Status of Service:  Completed, signed off  Sherald Barge, RN 12/10/2017, 11:10 AM

## 2017-12-10 NOTE — Discharge Summary (Signed)
Physician Discharge Summary  Erica [name] VZC:[id number] DOB: 07-Jul-1951 DOA: 12/07/2017  PCP: Iona Beard, MD  Admit date: 12/07/2017 Discharge date: 12/10/2017  Admitted From: Home Disposition:  Home   Recommendations for Outpatient Follow-up:  1. Follow up with PCP in 1-2 weeks 2. Please obtain BMP/CBC in one week    Discharge Condition: Stable CODE STATUS: FULL Diet recommendation: Heart Healthy / Carb Modified   Brief/Interim Summary: 67 year old female with a history of COPD, chronic respiratory failure on 2 L, SVT, diastolic CHF, CKD stage III, and diabetes mellitus presenting with 3-day history of worsening shortness of breath. Upon arrival, the patient was in respiratory distress with oxygen saturation in the 70s. The patient was placed on BiPAP. The patient denied any fevers, chills, chest pain, coughing, hemoptysis, nausea, vomiting, diarrhea, abdominal pain, hematochezia, melena, dysuria, hematuria, headaches. She noted increasing lower extremity edema and orthopnea for the past 3 days. She has not on any type of fluid restriction nor does she weigh herself. In the emergency department, The patient was placed on BiPAP initially. Chest x-ray revealed diffuse interstitial markings and pulmonary edema with small bilateral pleural effusions. She was started on intravenous furosemide 40 mg IV x1 with improvement of her respiratory status.She was continued on IV Lasix and started on IV steroids with clinical improvement. She was weaned off of BiPAP.     Discharge Diagnoses:  Acute on chronic respiratory failure with hypoxia -Secondary COPD exacerbation and CHF exacerbation -Initially placed on BiPAP -Now presently stable on 3 L -Wean back to home 2 L -V/Q scan neg  Acute on chronic diastolic CHF -daily weights--NEG 1 lb -accurate I/O's--not complete -fluid restrict -12/5/18Echo--EF 60-65%, no WMA -change to po lasix-->home with lasix 40 mg po  daily -pt endorses dietary indiscretion -long discussion about diet -discharge weight 179  COPD exacerbation -continueSolu-Medrol IV>>plan to de-escalate to po prednisone 3/18--home with taper -Continueduo nebs -ContinuePulmicort -quit smoking 3 weeks PTA after 25 pk years  Diabetes mellitus type 2 -NovoLog sliding scale -12/08/17-Hemoglobin A1c--4.7--question accuracy  Acute on chronic renal failure--CKD stage III -Baseline creatinine 1.1-1.4 -Monitor BMP with diuresis -presenting creatinine 1.70 -suspect new renal baseline with progression of renal disease--1.4 to 1.8 -serum creatinine 1.84 on day of d/c  Essential hypertension -Discontinue valsartan/HCTZ due to worsening renal function -restart amlodipine -Continue metoprolol tartrate  SVT -Presently in sinus rhythm -Continue metoprolol tartrate  Hyperkalemia -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   aspirin 81 MG tablet Take 2 tablets by mouth daily.   atorvastatin 80 MG tablet Commonly known as:  LIPITOR Take 80 mg by mouth daily.   budesonide-formoterol 160-4.5 MCG/ACT inhaler Commonly known as:  SYMBICORT Inhale 2 puffs into the lungs 2 (two) times daily.   buPROPion 150 MG 12 hr tablet Commonly known as:  WELLBUTRIN SR Take 150 mg by mouth 2 (two) times daily.   clopidogrel  75 MG tablet Commonly known as:  PLAVIX Take 75 mg by mouth daily.   famotidine 20 MG tablet Commonly  known as:  PEPCID Take 20 mg by mouth 2 (two) times daily.   furosemide 40 MG tablet Commonly known as:  LASIX Take 1 tablet (40 mg total) by mouth daily. Start taking on:  12/11/2017   ipratropium-albuterol 0.5-2.5 (3) MG/3ML Soln Commonly known as:  DUONEB Take 3 mLs by nebulization every 6 (six) hours as needed (for shortness of breath).   LINZESS 145 MCG Caps capsule Generic drug:  linaclotide Take 145 mcg by mouth daily.   metoprolol tartrate 25 MG tablet Commonly known as:  LOPRESSOR Take 0.5 tablets (12.5 mg total) by mouth 2 (two) times daily.   omega-3 acid ethyl esters 1 g capsule Commonly known as:  LOVAZA Take 2 g by mouth 2 (two) times daily.   omeprazole 20 MG capsule Commonly known as:  PRILOSEC Take 20 mg by mouth daily.   ONGLYZA 5 MG Tabs tablet Generic drug:  saxagliptin HCl Take 5 mg by mouth daily. Reported on 09/22/2015   OXYGEN Inhale 2 L into the lungs continuous.   predniSONE 10 MG tablet Commonly known as:  DELTASONE Take 6 tablets (60 mg total) by mouth daily with breakfast. And decrease by one tablet daily Start taking on:  12/11/2017   SPIRIVA HANDIHALER 18 MCG inhalation capsule Generic drug:  tiotropium Take 1 puff by mouth daily.       No Known Allergies  Consultations:  none   Procedures/Studies: Nm Pulmonary Vent And Perf (v/q Scan)  Result Date: 12/07/2017 CLINICAL DATA:  Initial evaluation for 1 day history of shortness of breath. History of CHF. EXAM: NUCLEAR MEDICINE VENTILATION - PERFUSION LUNG SCAN TECHNIQUE: Ventilation images were obtained in multiple projections using inhaled aerosol Tc-60m DTPA. Perfusion images were obtained in multiple projections after intravenous injection of Tc-42m-MAA. RADIOPHARMACEUTICALS:  33 mCi of Tc-76m DTPA aerosol inhalation and 4.4 mCi Tc61m-MAA IV COMPARISON:  Prior chest radiograph from earlier the same day. FINDINGS: Ventilation: No focal ventilation defect. Perfusion: No wedge shaped  mismatched peripheral perfusion defects to suggest acute pulmonary embolism. IMPRESSION: Negative VQ scan. No imaging findings to suggest acute pulmonary embolism identified. Electronically Signed   By: Jeannine Boga M.D.   On: 12/07/2017 17:01   Dg Chest Portable 1 View  Result Date: 12/07/2017 CLINICAL DATA:  Increased shortness of breath with productive cough. EXAM: PORTABLE CHEST 1 VIEW COMPARISON:  Chest x-ray dated September 19, 2017. FINDINGS: Stable cardiomegaly. Diffusely increased interstitial markings. Hazy opacities at both lung bases. Small bilateral pleural effusions. No consolidation or pneumothorax. No acute osseous abnormality. IMPRESSION: Diffuse interstitial and lower lobe predominant alveolar pulmonary edema with small bilateral pleural effusions. Electronically Signed   By: Titus Dubin M.D.   On: 12/07/2017 12:17         Discharge Exam: Vitals:   12/10/17 0947 12/10/17 1005  BP:    Pulse:    Resp:    Temp:    SpO2: 93% 93%   Vitals:   12/09/17 2122 12/10/17 0500 12/10/17 0947 12/10/17 1005  BP: (!) 150/55 (!) 122/54    Pulse: 63 67    Resp: 18 17    Temp: 98.4 F (36.9 C) 97.7 F (36.5 C)    TempSrc: Oral Oral    SpO2: 94% 97% 93% 93%  Weight:  81.3 kg (179 lb 3.7 oz)    Height:        General:  Pt is alert, awake, not in acute distress Cardiovascular: RRR, S1/S2 +, no rubs, no gallops Respiratory: Bibasilar rales.  No wheezing.  Good air movement. Abdominal: Soft, NT, ND, bowel sounds + Extremities: no edema, no cyanosis   The results of significant diagnostics from this hospitalization (including imaging, microbiology, ancillary and laboratory) are listed below for reference.    Significant Diagnostic Studies: Nm Pulmonary Vent And Perf (v/q Scan)  Result Date: 12/07/2017 CLINICAL DATA:  Initial evaluation for 1 day history of shortness of breath. History of CHF. EXAM: NUCLEAR MEDICINE VENTILATION - PERFUSION LUNG SCAN TECHNIQUE:  Ventilation images were obtained in multiple projections using inhaled aerosol Tc-75m DTPA. Perfusion images were obtained in multiple projections after intravenous injection of Tc-60m-MAA. RADIOPHARMACEUTICALS:  33 mCi of Tc-76m DTPA aerosol inhalation and 4.4 mCi Tc67m-MAA IV COMPARISON:  Prior chest radiograph from earlier the same day. FINDINGS: Ventilation: No focal ventilation defect. Perfusion: No wedge shaped mismatched peripheral perfusion defects to suggest acute pulmonary embolism. IMPRESSION: Negative VQ scan. No imaging findings to suggest acute pulmonary embolism identified. Electronically Signed   By: Jeannine Boga M.D.   On: 12/07/2017 17:01   Dg Chest Portable 1 View  Result Date: 12/07/2017 CLINICAL DATA:  Increased shortness of breath with productive cough. EXAM: PORTABLE CHEST 1 VIEW COMPARISON:  Chest x-ray dated September 19, 2017. FINDINGS: Stable cardiomegaly. Diffusely increased interstitial markings. Hazy opacities at both lung bases. Small bilateral pleural effusions. No consolidation or pneumothorax. No acute osseous abnormality. IMPRESSION: Diffuse interstitial and lower lobe predominant alveolar pulmonary edema with small bilateral pleural effusions. Electronically Signed   By: Titus Dubin M.D.   On: 12/07/2017 12:17     Microbiology: No results found for this or any previous visit (from the past 240 hour(s)).   Labs: Basic Metabolic Panel: Recent Labs  Lab 12/07/17 1219 12/08/17 0732 12/09/17 0624 12/10/17 0609  NA 141 143 143 141  K 4.0 5.0 5.5* 3.5  CL 104 103 100* 97*  CO2 26 29 30  33*  GLUCOSE 167* 167* 176* 183*  BUN 28* 30* 46* 51*  CREATININE 1.70* 1.71* 1.83* 1.84*  CALCIUM 8.7* 8.9 9.1 8.3*  MG  --   --  2.1  --    Liver Function Tests: Recent Labs  Lab 12/07/17 1219  AST 10*  ALT 9*  ALKPHOS 90  BILITOT 0.3  PROT 6.8  ALBUMIN 3.3*   No results for input(s): LIPASE, AMYLASE in the last 168 hours. No results for input(s):  AMMONIA in the last 168 hours. CBC: Recent Labs  Lab 12/07/17 1219  WBC 7.8  NEUTROABS 6.1  HGB 9.4*  HCT 32.1*  MCV 82.9  PLT 250   Cardiac Enzymes: Recent Labs  Lab 12/07/17 1219  TROPONINI <0.03   BNP: Invalid input(s): POCBNP CBG: Recent Labs  Lab 12/09/17 0811 12/09/17 1239 12/09/17 1704 12/09/17 2051 12/10/17 0748  GLUCAP 153* 154* 226* 129* 171*    Time coordinating discharge:  Greater than 30 minutes  Signed:  Orson Eva, DO [facility] Pager: [phone] 12/10/2017, 10:29 AM

## 2017-12-11 NOTE — Telephone Encounter (Signed)
I sent a request to Dr Fransico Setters office for recent/last labs

## 2017-12-12 ENCOUNTER — Other Ambulatory Visit: Payer: Self-pay | Admitting: *Deleted

## 2017-12-12 NOTE — Patient Outreach (Signed)
[name]) Care Management  12/12/2017  Erica [name] 03-27-1951 [id number]   Transition care services telephone call week one  Spoke with her after recent d/c on 12/10/17   She states she was admitted for sob she reports going to the ED for the primary concern "I had problems breathing" CM discussed this as part of her d/c dx of CHF. She states she was told on d/c to stop "Smoking, eating salty and greasy foods and drinking sodas"   CHF -Today she reports no swelling, minimal sob but using oxygen, no chest pain,   When CM inquired about her weight she reports, "My thing is broke. I got to buy one and I ain't got no money right now. I got to wait until I can get some money" Later in the telephone call her "Boyfriend" found the scales and she weighed to confirm a weight of 180 lbs today. She reports her weight in 10/2017 was 181 lbs and in March was 181 lbs.   Fluid restriction is "seven cups water a day"   With review of dc instruction she confirms she was told to start lasix. She is able to identify the purpose of lasix as "make me pee a lot" CM discussed the importance of lasix to be taken as ordered to prevent increase in fluids/volume overload.  She voiced understanding. She states she is taking prednisone to "help me breath better" She confirms she was told to stop valsartan-hydrochlorothiazide and it was "for my blood pressure pill"  Cm discussed the lasix an valsartan-hydrochlorothiazide and encouraged to stop valsartan-hydrochlorothiazide.   She reports in the hospital they "went over my diet and told me not to eat strawberries and bananas but I like them."   "I was put on iron"  CM encouraged her to check her Oxygen saturation during the call. She reports having a low battery error message. Cm encouraged her to replace the battery and she did to confirm a reading of 93%. She confirms she is on Oxygen as ordered at 2 L/[place]    She reports she stopped smoking "three  weeks ago" She was able to tell Cm that smoking was "bad on my heart" CM discussed the issues smoking causes with her heart and CHF Se voiced understanding   She discussed that she wanted a smaller oxygen tank   Follow up appointment with primary MD, Dr French Ana is on 01/05/18   Plans Follow up with [name] for transition of care   Re review CHF zones and symptoms to report to medical providers Discussed compliance (non compliance with diet and smoking as risk factors for re admission) Cautioned not to take lasix and valsartan-hydrochlorothiazide at same time. Encouraged to take Lasix once a day only as ordered Discussed electrolyte concerns and that valsartan-hydrochlorothiazide has diuretic components- voiced understanding  Discussed diet and why her providers encouraged her not to eat "strawberries and bananas" related to GI concerns- She voiced understanding  Discussed the importance of taking iron to prevent "low blood", low hemoglobin levels Discussed iron increasing oxygen intake - She voiced understanding  Educated on importance of getting new batteries for her pulse oximeter and need to have oxygen saturation stay above 88%- she voiced understanding Encouraged to find, read and follow her recent d/c instructions - she read them to CM Enc to ask MD for referral to cardiologist -discussed the purpose of cardiologist and she voiced understanding  Discuss reading food labels and keeping sodium intake below 2300 mg-  she voiced understanding   Discussed her hospital discharge wt of 179 lbs and if her weight gets to 182 lbs encouraged her to call [name], 24 hour nurse line or CM- she voiced understanding - Teach back method used and she verbalized instructions accurately  Encourage to call DME company and [name] to ask for portable tank assistance She voiced understanding  Route note to care team members    Yankee [place] L. Lavina Hamman, RN, BSN, [place] Coordinator [phone] week day mobile

## 2017-12-14 DIAGNOSIS — J441 Chronic obstructive pulmonary disease with (acute) exacerbation: Secondary | ICD-10-CM | POA: Diagnosis not present

## 2017-12-14 DIAGNOSIS — J9610 Chronic respiratory failure, unspecified whether with hypoxia or hypercapnia: Secondary | ICD-10-CM | POA: Diagnosis not present

## 2017-12-17 ENCOUNTER — Inpatient Hospital Stay (HOSPITAL_COMMUNITY)
Admission: EM | Admit: 2017-12-17 | Discharge: 2017-12-21 | DRG: 291 | Disposition: A | Discharge status: Home / Self Care | Payer: Medicare HMO | Attending: Internal Medicine | Admitting: Internal Medicine

## 2017-12-17 ENCOUNTER — Encounter (HOSPITAL_COMMUNITY): Payer: Self-pay | Admitting: Emergency Medicine

## 2017-12-17 ENCOUNTER — Other Ambulatory Visit: Payer: Self-pay

## 2017-12-17 ENCOUNTER — Emergency Department (HOSPITAL_COMMUNITY): Payer: Medicare HMO

## 2017-12-17 DIAGNOSIS — J441 Chronic obstructive pulmonary disease with (acute) exacerbation: Secondary | ICD-10-CM | POA: Diagnosis present

## 2017-12-17 DIAGNOSIS — J9621 Acute and chronic respiratory failure with hypoxia: Secondary | ICD-10-CM | POA: Diagnosis present

## 2017-12-17 DIAGNOSIS — N179 Acute kidney failure, unspecified: Secondary | ICD-10-CM | POA: Diagnosis not present

## 2017-12-17 DIAGNOSIS — R748 Abnormal levels of other serum enzymes: Secondary | ICD-10-CM | POA: Diagnosis not present

## 2017-12-17 DIAGNOSIS — Z9981 Dependence on supplemental oxygen: Secondary | ICD-10-CM

## 2017-12-17 DIAGNOSIS — I471 Supraventricular tachycardia: Secondary | ICD-10-CM | POA: Diagnosis present

## 2017-12-17 DIAGNOSIS — Z823 Family history of stroke: Secondary | ICD-10-CM | POA: Diagnosis not present

## 2017-12-17 DIAGNOSIS — I248 Other forms of acute ischemic heart disease: Secondary | ICD-10-CM | POA: Diagnosis present

## 2017-12-17 DIAGNOSIS — E1165 Type 2 diabetes mellitus with hyperglycemia: Secondary | ICD-10-CM | POA: Diagnosis present

## 2017-12-17 DIAGNOSIS — J9600 Acute respiratory failure, unspecified whether with hypoxia or hypercapnia: Secondary | ICD-10-CM | POA: Diagnosis not present

## 2017-12-17 DIAGNOSIS — F17210 Nicotine dependence, cigarettes, uncomplicated: Secondary | ICD-10-CM | POA: Diagnosis present

## 2017-12-17 DIAGNOSIS — Z7951 Long term (current) use of inhaled steroids: Secondary | ICD-10-CM | POA: Diagnosis not present

## 2017-12-17 DIAGNOSIS — K219 Gastro-esophageal reflux disease without esophagitis: Secondary | ICD-10-CM | POA: Diagnosis present

## 2017-12-17 DIAGNOSIS — N183 Chronic kidney disease, stage 3 (moderate): Secondary | ICD-10-CM | POA: Diagnosis not present

## 2017-12-17 DIAGNOSIS — Z79899 Other long term (current) drug therapy: Secondary | ICD-10-CM | POA: Diagnosis not present

## 2017-12-17 DIAGNOSIS — N170 Acute kidney failure with tubular necrosis: Secondary | ICD-10-CM | POA: Diagnosis not present

## 2017-12-17 DIAGNOSIS — Z720 Tobacco use: Secondary | ICD-10-CM | POA: Diagnosis not present

## 2017-12-17 DIAGNOSIS — E1122 Type 2 diabetes mellitus with diabetic chronic kidney disease: Secondary | ICD-10-CM | POA: Diagnosis present

## 2017-12-17 DIAGNOSIS — Z7982 Long term (current) use of aspirin: Secondary | ICD-10-CM | POA: Diagnosis not present

## 2017-12-17 DIAGNOSIS — Z7902 Long term (current) use of antithrombotics/antiplatelets: Secondary | ICD-10-CM | POA: Diagnosis not present

## 2017-12-17 DIAGNOSIS — R6510 Systemic inflammatory response syndrome (SIRS) of non-infectious origin without acute organ dysfunction: Secondary | ICD-10-CM | POA: Diagnosis present

## 2017-12-17 DIAGNOSIS — I10 Essential (primary) hypertension: Secondary | ICD-10-CM | POA: Diagnosis present

## 2017-12-17 DIAGNOSIS — J439 Emphysema, unspecified: Secondary | ICD-10-CM | POA: Diagnosis not present

## 2017-12-17 DIAGNOSIS — R069 Unspecified abnormalities of breathing: Secondary | ICD-10-CM | POA: Diagnosis not present

## 2017-12-17 DIAGNOSIS — Z90710 Acquired absence of both cervix and uterus: Secondary | ICD-10-CM

## 2017-12-17 DIAGNOSIS — I130 Hypertensive heart and chronic kidney disease with heart failure and stage 1 through stage 4 chronic kidney disease, or unspecified chronic kidney disease: Principal | ICD-10-CM | POA: Diagnosis present

## 2017-12-17 DIAGNOSIS — Z8673 Personal history of transient ischemic attack (TIA), and cerebral infarction without residual deficits: Secondary | ICD-10-CM

## 2017-12-17 DIAGNOSIS — E875 Hyperkalemia: Secondary | ICD-10-CM | POA: Diagnosis not present

## 2017-12-17 DIAGNOSIS — N184 Chronic kidney disease, stage 4 (severe): Secondary | ICD-10-CM | POA: Diagnosis present

## 2017-12-17 DIAGNOSIS — R778 Other specified abnormalities of plasma proteins: Secondary | ICD-10-CM | POA: Diagnosis present

## 2017-12-17 DIAGNOSIS — G8929 Other chronic pain: Secondary | ICD-10-CM | POA: Diagnosis present

## 2017-12-17 DIAGNOSIS — E785 Hyperlipidemia, unspecified: Secondary | ICD-10-CM | POA: Diagnosis present

## 2017-12-17 DIAGNOSIS — I5033 Acute on chronic diastolic (congestive) heart failure: Secondary | ICD-10-CM | POA: Diagnosis not present

## 2017-12-17 DIAGNOSIS — J9601 Acute respiratory failure with hypoxia: Secondary | ICD-10-CM | POA: Diagnosis not present

## 2017-12-17 DIAGNOSIS — R0600 Dyspnea, unspecified: Secondary | ICD-10-CM | POA: Diagnosis not present

## 2017-12-17 DIAGNOSIS — R7989 Other specified abnormal findings of blood chemistry: Secondary | ICD-10-CM | POA: Diagnosis present

## 2017-12-17 DIAGNOSIS — R0602 Shortness of breath: Secondary | ICD-10-CM

## 2017-12-17 HISTORY — DX: Hyperlipidemia, unspecified: E78.5

## 2017-12-17 LAB — COMPREHENSIVE METABOLIC PANEL
ALT: 29 U/L (ref 14–54)
AST: 17 U/L (ref 15–41)
Albumin: 3.5 g/dL (ref 3.5–5.0)
Alkaline Phosphatase: 66 U/L (ref 38–126)
Anion gap: 12 (ref 5–15)
BILIRUBIN TOTAL: 0.5 mg/dL (ref 0.3–1.2)
BUN: 27 mg/dL — AB (ref 6–20)
CO2: 30 mmol/L (ref 22–32)
CREATININE: 1.65 mg/dL — AB (ref 0.44–1.00)
Calcium: 7.6 mg/dL — ABNORMAL LOW (ref 8.9–10.3)
Chloride: 97 mmol/L — ABNORMAL LOW (ref 101–111)
GFR calc Af Amer: 36 mL/min — ABNORMAL LOW (ref >60)
GFR, EST NON AFRICAN AMERICAN: 31 mL/min — AB (ref >60)
Glucose, Bld: 160 mg/dL — ABNORMAL HIGH (ref 65–99)
Potassium: 3.1 mmol/L — ABNORMAL LOW (ref 3.5–5.1)
Sodium: 139 mmol/L (ref 135–145)
Total Protein: 6.7 g/dL (ref 6.5–8.1)

## 2017-12-17 LAB — I-STAT CHEM 8, ED
BUN: 22 mg/dL — AB (ref 6–20)
CALCIUM ION: 1.01 mmol/L — AB (ref 1.15–1.40)
Chloride: 95 mmol/L — ABNORMAL LOW (ref 101–111)
Creatinine, Ser: 1.6 mg/dL — ABNORMAL HIGH (ref 0.44–1.00)
Glucose, Bld: 153 mg/dL — ABNORMAL HIGH (ref 65–99)
HEMATOCRIT: 36 % (ref 36.0–46.0)
HEMOGLOBIN: 12.2 g/dL (ref 12.0–15.0)
Potassium: 3.2 mmol/L — ABNORMAL LOW (ref 3.5–5.1)
SODIUM: 141 mmol/L (ref 135–145)
TCO2: 32 mmol/L (ref 22–32)

## 2017-12-17 LAB — CBC WITH DIFFERENTIAL/PLATELET
Basophils Absolute: 0 10*3/uL (ref 0.0–0.1)
Basophils Relative: 0 %
EOS ABS: 0.1 10*3/uL (ref 0.0–0.7)
EOS PCT: 0 %
HCT: 34.3 % — ABNORMAL LOW (ref 36.0–46.0)
Hemoglobin: 10.3 g/dL — ABNORMAL LOW (ref 12.0–15.0)
LYMPHS ABS: 1 10*3/uL (ref 0.7–4.0)
Lymphocytes Relative: 9 %
MCH: 24.3 pg — AB (ref 26.0–34.0)
MCHC: 30 g/dL (ref 30.0–36.0)
MCV: 80.9 fL (ref 78.0–100.0)
Monocytes Absolute: 1.3 10*3/uL — ABNORMAL HIGH (ref 0.1–1.0)
Monocytes Relative: 11 %
Neutro Abs: 9.3 10*3/uL — ABNORMAL HIGH (ref 1.7–7.7)
Neutrophils Relative %: 80 %
PLATELETS: 256 10*3/uL (ref 150–400)
RBC: 4.24 MIL/uL (ref 3.87–5.11)
RDW: 18.1 % — AB (ref 11.5–15.5)
WBC: 11.7 10*3/uL — AB (ref 4.0–10.5)

## 2017-12-17 LAB — I-STAT TROPONIN, ED: TROPONIN I, POC: 0.3 ng/mL — AB (ref 0.00–0.08)

## 2017-12-17 LAB — MAGNESIUM: Magnesium: 1.1 mg/dL — ABNORMAL LOW (ref 1.7–2.4)

## 2017-12-17 LAB — TROPONIN I: Troponin I: 0.53 ng/mL (ref <0.03)

## 2017-12-17 LAB — I-STAT CG4 LACTIC ACID, ED
LACTIC ACID, VENOUS: 0.42 mmol/L — AB (ref 0.5–1.9)
Lactic Acid, Venous: 1.55 mmol/L (ref 0.5–1.9)

## 2017-12-17 LAB — BRAIN NATRIURETIC PEPTIDE: B Natriuretic Peptide: 583 pg/mL — ABNORMAL HIGH (ref 0.0–100.0)

## 2017-12-17 MED ORDER — PIPERACILLIN-TAZOBACTAM 3.375 G IVPB 30 MIN
3.3750 g | Freq: Once | INTRAVENOUS | Status: AC
  Administered 2017-12-17: 3.375 g via INTRAVENOUS
  Filled 2017-12-17: qty 50

## 2017-12-17 MED ORDER — IPRATROPIUM-ALBUTEROL 0.5-2.5 (3) MG/3ML IN SOLN: 3.0000 mL | Freq: Once | RESPIRATORY_TRACT | Status: DC

## 2017-12-17 MED ORDER — VANCOMYCIN HCL IN DEXTROSE 1-5 GM/200ML-% IV SOLN
1000.0000 mg | Freq: Once | INTRAVENOUS | Status: AC
  Administered 2017-12-17: 1000 mg via INTRAVENOUS
  Filled 2017-12-17: qty 200

## 2017-12-17 MED ORDER — MAGNESIUM SULFATE 4 GM/100ML IV SOLN
4.0000 g | Freq: Once | INTRAVENOUS | Status: AC
  Administered 2017-12-17: 4 g via INTRAVENOUS
  Filled 2017-12-17: qty 100

## 2017-12-17 MED ORDER — METHYLPREDNISOLONE SODIUM SUCC 125 MG IJ SOLR
125.0000 mg | Freq: Once | INTRAMUSCULAR | Status: AC
  Administered 2017-12-17: 125 mg via INTRAVENOUS
  Filled 2017-12-17: qty 2

## 2017-12-17 MED ORDER — PREDNISONE 20 MG PO TABS
40.0000 mg | ORAL_TABLET | Freq: Every day | ORAL | Status: DC
  Administered 2017-12-19 – 2017-12-20 (×2): 40 mg via ORAL
  Filled 2017-12-17 (×2): qty 2

## 2017-12-17 MED ORDER — ACETAMINOPHEN 325 MG PO TABS
ORAL_TABLET | ORAL | Status: AC
  Filled 2017-12-17: qty 2

## 2017-12-17 MED ORDER — ACETAMINOPHEN 325 MG PO TABS
650.0000 mg | ORAL_TABLET | Freq: Once | ORAL | Status: AC
  Administered 2017-12-17: 650 mg via ORAL

## 2017-12-17 MED ORDER — ALBUTEROL SULFATE (2.5 MG/3ML) 0.083% IN NEBU
2.5000 mg | INHALATION_SOLUTION | Freq: Once | RESPIRATORY_TRACT | Status: AC
  Administered 2017-12-17: 2.5 mg via RESPIRATORY_TRACT
  Filled 2017-12-17: qty 3

## 2017-12-17 MED ORDER — METHYLPREDNISOLONE SODIUM SUCC 40 MG IJ SOLR
40.0000 mg | Freq: Four times a day (QID) | INTRAMUSCULAR | Status: AC
  Administered 2017-12-17 – 2017-12-18 (×4): 40 mg via INTRAVENOUS
  Filled 2017-12-17 (×4): qty 1

## 2017-12-17 MED ORDER — ALBUTEROL SULFATE (2.5 MG/3ML) 0.083% IN NEBU: 2.5000 mg | INHALATION_SOLUTION | Freq: Once | RESPIRATORY_TRACT | Status: DC

## 2017-12-17 MED ORDER — IPRATROPIUM-ALBUTEROL 0.5-2.5 (3) MG/3ML IN SOLN
3.0000 mL | Freq: Once | RESPIRATORY_TRACT | Status: AC
  Administered 2017-12-17: 3 mL via RESPIRATORY_TRACT
  Filled 2017-12-17: qty 3

## 2017-12-17 MED ORDER — FUROSEMIDE 10 MG/ML IJ SOLN
20.0000 mg | Freq: Once | INTRAMUSCULAR | Status: AC
  Administered 2017-12-17: 20 mg via INTRAVENOUS
  Filled 2017-12-17: qty 2

## 2017-12-17 MED ORDER — POTASSIUM CHLORIDE CRYS ER 20 MEQ PO TBCR
40.0000 meq | EXTENDED_RELEASE_TABLET | Freq: Once | ORAL | Status: AC
Start: 2017-12-17 — End: 2017-12-17
  Administered 2017-12-17: 40 meq via ORAL
  Filled 2017-12-17: qty 2

## 2017-12-17 NOTE — ED Notes (Signed)
Blood cultures done, Zosyn started.

## 2017-12-17 NOTE — ED Triage Notes (Addendum)
Pt c/o SOB that began last night. EMS states when the got to the house, pt had nasal cannula on, but oxygen was not on. Pt wears 2L for COPD. Pt received albuterol en route. Sats 70% on 3L. Placed on NRB.

## 2017-12-17 NOTE — ED Provider Notes (Signed)
[facility] EMERGENCY DEPARTMENT Provider Note   CSN: [id number] Arrival date & time: 12/17/17  1729     History   Chief Complaint Chief Complaint  Patient presents with  . Shortness of Breath    HPI Erica [name] is a 67 y.o. female.  Patient complains of shortness of breath fever and cough.  Patient was just admitted to the hospital with her COPD and heart failure exacerbation  The history is provided by the patient. No language interpreter was used.  Shortness of Breath  This is a new problem. The problem occurs continuously.The current episode started more than 2 days ago. The problem has not changed since onset.Associated symptoms include a fever. Pertinent negatives include no headaches, no cough, no chest pain, no abdominal pain and no rash. It is unknown what precipitated the problem. Risk factors: COPD. She has tried nothing for the symptoms. The treatment provided no relief. She has had prior hospitalizations. She has had prior ED visits. She has had prior ICU admissions. Associated medical issues include COPD.    Past Medical History:  Diagnosis Date  . Arthritis   . CHF (congestive heart failure) ([place])   . Chronic back pain   . COPD (chronic obstructive pulmonary disease) (HCC)    2L home O2  . Diabetes mellitus without complication ([place])   . Hypertension   . Stroke (cerebrum) [facility])     Patient Active Problem List   Diagnosis Date Noted  . Acute renal failure superimposed on stage 3 chronic kidney disease ([place]) 12/07/2017  . Acute on chronic respiratory failure with hypoxia ([place]) 12/07/2017  . COPD with acute exacerbation ([place]) 12/07/2017  . Acute diastolic CHF (congestive heart failure) ([place]) 09/19/2017  . Iron deficiency anemia due to chronic blood loss   . Acute diverticulitis 08/29/2017  . Heme positive stool   . Symptomatic anemia   . GI bleed 08/27/2017  . Elevated troponin 08/27/2017  . (HFpEF) heart failure with preserved ejection fraction  ([place]) 08/27/2017  . Acute dyspnea   . Acute on chronic respiratory failure ([place]) 02/11/2017  . Acute on chronic diastolic CHF (congestive heart failure) ([place]) 02/11/2017  . COPD exacerbation ([place]) 11/20/2015  . Benign essential HTN 11/20/2015  . GERD (gastroesophageal reflux disease) 11/20/2015  . Tobacco abuse 11/20/2015  . Type 2 diabetes mellitus with hyperglycemia ([place]) 11/20/2015  . COPD exacerbation ([place]) 09/05/2015  . Chronic pain syndrome 09/05/2015  . Hypertension 09/05/2015  . Diabetes mellitus type 2, controlled ([place]) 09/05/2015  . Chronic respiratory failure ([place]) 09/05/2015  . OLECRANON BURSITIS, LEFT 08/04/2010    Past Surgical History:  Procedure Laterality Date  . ABDOMINAL HYSTERECTOMY    . colonoscopy  2009   Dr. Gala Romney: cluster of diminutive rectal polyps and 2 diminutive polyps in rectosigmoid junction, hyperplastic  . COLONOSCOPY WITH PROPOFOL N/A 10/18/2017   Procedure: COLONOSCOPY WITH PROPOFOL;  Surgeon: Daneil Dolin, MD;  Location: [facility] ENDO SUITE;  Service: Endoscopy;  Laterality: N/A;  9:30am  . ESOPHAGOGASTRODUODENOSCOPY (EGD) WITH PROPOFOL N/A 08/29/2017   Dr. Gala Romney with propofol: small hiatal hernia. 4cm fundal diverticulum.   Marland Kitchen left hand middle finger     in an accident at work, amputated     OB History    Gravida  0   Para  0   Term  0   Preterm  0   AB  0   Living        SAB  0   TAB  0  Ectopic  0   Multiple      Live Births               Home Medications    Prior to Admission medications   Medication Sig Start Date End Date Taking? Authorizing Provider  amLODipine (NORVASC) 5 MG tablet Take 1 tablet (5 mg total) by mouth daily. 08/31/17  Yes TatShanon Brow, MD  aspirin 81 MG tablet Take 2 tablets by mouth daily.  10/22/15  Yes [provider]  atorvastatin (LIPITOR) 80 MG tablet Take 80 mg by mouth daily.  12/04/16  Yes [provider]  budesonide-formoterol (SYMBICORT) 160-4.5 MCG/ACT inhaler Inhale 2  puffs into the lungs 2 (two) times daily. 11/23/15  Yes Barton Dubois, MD  buPROPion [facility] SR) 150 MG 12 hr tablet Take 150 mg by mouth 2 (two) times daily. 12/04/16  Yes [provider]  clopidogrel (PLAVIX) 75 MG tablet Take 75 mg by mouth daily.   Yes [provider]  famotidine (PEPCID) 20 MG tablet Take 20 mg by mouth 2 (two) times daily.   Yes [provider]  furosemide (LASIX) 40 MG tablet Take 1 tablet (40 mg total) by mouth daily. 12/11/17  Yes Tat, Shanon Brow, MD  ipratropium-albuterol (DUONEB) 0.5-2.5 (3) MG/3ML SOLN Take 3 mLs by nebulization every 6 (six) hours as needed (for shortness of breath).   Yes [provider]  Linaclotide (LINZESS) 145 MCG CAPS capsule Take 145 mcg by mouth daily.   Yes [provider]  metoprolol tartrate (LOPRESSOR) 25 MG tablet Take 0.5 tablets (12.5 mg total) by mouth 2 (two) times daily. 08/30/17  Yes Tat, Shanon Brow, MD  omega-3 acid ethyl esters (LOVAZA) 1 g capsule Take 2 g by mouth 2 (two) times daily.  12/07/16  Yes [provider]  omeprazole (PRILOSEC) 20 MG capsule Take 20 mg by mouth daily. 12/04/16  Yes [provider]  OXYGEN Inhale 2 L into the lungs continuous.   Yes [provider]  saxagliptin HCl (ONGLYZA) 5 MG TABS tablet Take 5 mg by mouth daily. Reported on 09/22/2015   Yes [provider]  SPIRIVA HANDIHALER 18 MCG inhalation capsule Take 1 puff by mouth daily. 10/31/17  Yes [provider]  predniSONE (DELTASONE) 10 MG tablet Take 6 tablets (60 mg total) by mouth daily with breakfast. And decrease by one tablet daily Patient not taking: Reported on 12/17/2017 12/11/17   Orson Eva, MD    Family History Family History  Problem Relation Age of Onset  . CVA Mother 86  . Cancer Neg Hx   . Colon cancer Neg Hx     Social History Social History   Tobacco Use  . Smoking status: Former Smoker    Packs/day: 1.00    Years: 52.00    Pack years: 52.00     Types: Cigarettes    Last attempt to quit: 09/23/2017    Years since quitting: 0.2  . Smokeless tobacco: Never Used  Substance Use Topics  . Alcohol use: No  . Drug use: No     Allergies   Patient has no known allergies.   Review of Systems Review of Systems  Constitutional: Positive for fever. Negative for appetite change and fatigue.  HENT: Negative for congestion, ear discharge and sinus pressure.   Eyes: Negative for discharge.  Respiratory: Positive for shortness of breath. Negative for cough.   Cardiovascular: Negative for chest pain.  Gastrointestinal: Negative for abdominal pain and diarrhea.  Genitourinary: Negative for  frequency and hematuria.  Musculoskeletal: Negative for back pain.  Skin: Negative for rash.  Neurological: Negative for seizures and headaches.  Psychiatric/Behavioral: Negative for hallucinations.     Physical Exam Updated Vital Signs BP (!) 126/51   Pulse 92   Temp (!) 102.5 F (39.2 C) (Rectal)   Resp 18   Ht 5\' 4"  (1.626 m)   Wt 82.6 kg (182 lb)   SpO2 91%   BMI 31.24 kg/m   Physical Exam  Constitutional: She is oriented to person, place, and time. She appears well-developed.  HENT:  Head: Normocephalic.  Eyes: Conjunctivae and EOM are normal. No scleral icterus.  Neck: Neck supple. No thyromegaly present.  Cardiovascular: Normal rate and regular rhythm. Exam reveals no gallop and no friction rub.  No murmur heard. Pulmonary/Chest: No stridor. She has wheezes. She has no rales. She exhibits no tenderness.  Abdominal: She exhibits no distension. There is no tenderness. There is no rebound.  Musculoskeletal: Normal range of motion. She exhibits no edema.  Lymphadenopathy:    She has no cervical adenopathy.  Neurological: She is oriented to person, place, and time. She exhibits normal muscle tone. Coordination normal.  Skin: No rash noted. No erythema.  Psychiatric: She has a normal mood and affect. Her behavior is normal.      ED Treatments / Results  Labs (all labs ordered are listed, but only abnormal results are displayed) Labs Reviewed  BRAIN NATRIURETIC PEPTIDE - Abnormal; Notable for the following components:      Result Value   B Natriuretic Peptide 583.0 (*)    All other components within normal limits  COMPREHENSIVE METABOLIC PANEL - Abnormal; Notable for the following components:   Potassium 3.1 (*)    Chloride 97 (*)    Glucose, Bld 160 (*)    BUN 27 (*)    Creatinine, Ser 1.65 (*)    Calcium 7.6 (*)    GFR calc non Af Amer 31 (*)    GFR calc Af Amer 36 (*)    All other components within normal limits  CBC WITH DIFFERENTIAL/PLATELET - Abnormal; Notable for the following components:   WBC 11.7 (*)    Hemoglobin 10.3 (*)    HCT 34.3 (*)    MCH 24.3 (*)    RDW 18.1 (*)    Neutro Abs 9.3 (*)    Monocytes Absolute 1.3 (*)    All other components within normal limits  I-STAT CHEM 8, ED - Abnormal; Notable for the following components:   Potassium 3.2 (*)    Chloride 95 (*)    BUN 22 (*)    Creatinine, Ser 1.60 (*)    Glucose, Bld 153 (*)    Calcium, Ion 1.01 (*)    All other components within normal limits  I-STAT TROPONIN, ED - Abnormal; Notable for the following components:   Troponin i, poc 0.30 (*)    All other components within normal limits  CULTURE, BLOOD (ROUTINE X 2)  CULTURE, BLOOD (ROUTINE X 2)  URINALYSIS, ROUTINE W REFLEX MICROSCOPIC  TROPONIN I  TROPONIN I  TROPONIN I  MAGNESIUM  I-STAT CG4 LACTIC ACID, ED  I-STAT CG4 LACTIC ACID, ED    EKG EKG Interpretation  Date/Time:  Monday December 17 2017 17:41:14 EDT Ventricular Rate:  84 PR Interval:    QRS Duration: 122 QT Interval:  363 QTC Calculation: 430 R Axis:   72 Text Interpretation:  Sinus rhythm Right atrial enlargement Nonspecific intraventricular conduction delay Minimal ST depression, diffuse  leads Confirmed by Milton Ferguson [phone]) on 12/17/2017 6:31:33 PM   Radiology Dg Chest Portable 1  View  Result Date: 12/17/2017 CLINICAL DATA:  Shortness of breath, recent pneumonia EXAM: PORTABLE CHEST 1 VIEW COMPARISON:  12/07/2017 FINDINGS: Cardiomegaly with vascular congestion. Diffuse interstitial prominence throughout the lungs compatible with interstitial edema, similar prior study. Possible small effusions. No acute bony abnormality. IMPRESSION: Mild pulmonary edema. Possible small effusions. No real change since prior study. Electronically Signed   By: Rolm Baptise M.D.   On: 12/17/2017 18:11    Procedures Procedures (including critical care time)  Medications Ordered in ED Medications  potassium chloride SA (K-DUR,KLOR-CON) CR tablet 40 mEq (has no administration in time range)  ipratropium-albuterol (DUONEB) 0.5-2.5 (3) MG/3ML nebulizer solution 3 mL (3 mLs Nebulization Given 12/17/17 1747)  albuterol (PROVENTIL) (2.5 MG/3ML) 0.083% nebulizer solution 2.5 mg (2.5 mg Nebulization Given 12/17/17 1747)  vancomycin (VANCOCIN) IVPB 1000 mg/200 mL premix (0 mg Intravenous Stopped 12/17/17 1941)  piperacillin-tazobactam (ZOSYN) IVPB 3.375 g (0 g Intravenous Stopped 12/17/17 1835)  acetaminophen (TYLENOL) tablet 650 mg (650 mg Oral Given 12/17/17 1824)  ipratropium-albuterol (DUONEB) 0.5-2.5 (3) MG/3ML nebulizer solution 3 mL (3 mLs Nebulization Given 12/17/17 1854)  albuterol (PROVENTIL) (2.5 MG/3ML) 0.083% nebulizer solution 2.5 mg (2.5 mg Nebulization Given 12/17/17 1854)  furosemide (LASIX) injection 20 mg (20 mg Intravenous Given 12/17/17 1938)  methylPREDNISolone sodium succinate (SOLU-MEDROL) 125 mg/2 mL injection 125 mg (125 mg Intravenous Given 12/17/17 1936)   CRITICAL CARE Performed by: Milton Ferguson Total critical care time: 45 minutes Critical care time was exclusive of separately billable procedures and treating other patients. Critical care was necessary to treat or prevent imminent or life-threatening deterioration. Critical care was time spent personally by me on the  following activities: development of treatment plan with patient and/or surrogate as well as nursing, discussions with consultants, evaluation of patient's response to treatment, examination of patient, obtaining history from patient or surrogate, ordering and performing treatments and interventions, ordering and review of laboratory studies, ordering and review of radiographic studies, pulse oximetry and re-evaluation of patient's condition.   Initial Impression / Assessment and Plan / ED Course  I have reviewed the triage vital signs and the nursing notes.  Pertinent labs & imaging results that were available during my care of the patient were reviewed by me and considered in my medical decision making (see chart for details).   Patient with acute fever cough shortness of breath.  Patient was given 2 neb treatments and she improved.  Patient is on 5 L nasal and her sats are 91%.  She is normally on 2 L.  The patient has an elevated troponin along with inferior depression on her EKG.  I spoke with cardiology and they do not want to start heparin and feel like the patient can be taken care of at [facility] they will consult in the morning on the patient.  I spoke with internal medicine they will admit the patient   Final Clinical Impressions(s) / ED Diagnoses   Final diagnoses:  SOB (shortness of breath)    ED Discharge Orders    None       Milton Ferguson, MD 12/17/17 2013

## 2017-12-17 NOTE — ED Notes (Signed)
EDP at bedside  

## 2017-12-17 NOTE — ED Notes (Signed)
CRITICAL VALUE ALERT  Critical Value:  0.53 Troponin   Date & Time Notied:  12/17/17 2236  Provider Notified: Notified MD  Orders Received/Actions taken: Notified MD

## 2017-12-18 ENCOUNTER — Other Ambulatory Visit: Payer: Self-pay | Admitting: *Deleted

## 2017-12-18 ENCOUNTER — Encounter (HOSPITAL_COMMUNITY): Payer: Self-pay | Admitting: Internal Medicine

## 2017-12-18 ENCOUNTER — Inpatient Hospital Stay (HOSPITAL_COMMUNITY): Payer: Medicare HMO

## 2017-12-18 DIAGNOSIS — E785 Hyperlipidemia, unspecified: Secondary | ICD-10-CM | POA: Diagnosis present

## 2017-12-18 DIAGNOSIS — E1165 Type 2 diabetes mellitus with hyperglycemia: Secondary | ICD-10-CM

## 2017-12-18 DIAGNOSIS — J9600 Acute respiratory failure, unspecified whether with hypoxia or hypercapnia: Secondary | ICD-10-CM

## 2017-12-18 DIAGNOSIS — K219 Gastro-esophageal reflux disease without esophagitis: Secondary | ICD-10-CM

## 2017-12-18 DIAGNOSIS — R0600 Dyspnea, unspecified: Secondary | ICD-10-CM

## 2017-12-18 DIAGNOSIS — R748 Abnormal levels of other serum enzymes: Secondary | ICD-10-CM

## 2017-12-18 LAB — PHOSPHORUS: PHOSPHORUS: 5.6 mg/dL — AB (ref 2.5–4.6)

## 2017-12-18 LAB — BASIC METABOLIC PANEL
Anion gap: 15 (ref 5–15)
BUN: 30 mg/dL — ABNORMAL HIGH (ref 6–20)
CHLORIDE: 99 mmol/L — AB (ref 101–111)
CO2: 26 mmol/L (ref 22–32)
CREATININE: 1.85 mg/dL — AB (ref 0.44–1.00)
Calcium: 7.6 mg/dL — ABNORMAL LOW (ref 8.9–10.3)
GFR calc Af Amer: 31 mL/min — ABNORMAL LOW (ref >60)
GFR calc non Af Amer: 27 mL/min — ABNORMAL LOW (ref >60)
Glucose, Bld: 255 mg/dL — ABNORMAL HIGH (ref 65–99)
Potassium: 4.4 mmol/L (ref 3.5–5.1)
Sodium: 140 mmol/L (ref 135–145)

## 2017-12-18 LAB — CBC WITH DIFFERENTIAL/PLATELET
BASOS ABS: 0 10*3/uL (ref 0.0–0.1)
Basophils Relative: 0 %
EOS ABS: 0 10*3/uL (ref 0.0–0.7)
Eosinophils Relative: 0 %
HCT: 35.2 % — ABNORMAL LOW (ref 36.0–46.0)
HEMOGLOBIN: 10.5 g/dL — AB (ref 12.0–15.0)
Lymphocytes Relative: 5 %
Lymphs Abs: 0.3 10*3/uL — ABNORMAL LOW (ref 0.7–4.0)
MCH: 24.2 pg — ABNORMAL LOW (ref 26.0–34.0)
MCHC: 29.8 g/dL — ABNORMAL LOW (ref 30.0–36.0)
MCV: 81.1 fL (ref 78.0–100.0)
Monocytes Absolute: 0.1 10*3/uL (ref 0.1–1.0)
Monocytes Relative: 2 %
NEUTROS PCT: 93 %
Neutro Abs: 5.8 10*3/uL (ref 1.7–7.7)
Platelets: 219 10*3/uL (ref 150–400)
RBC: 4.34 MIL/uL (ref 3.87–5.11)
RDW: 18.1 % — ABNORMAL HIGH (ref 11.5–15.5)
WBC: 6.2 10*3/uL (ref 4.0–10.5)

## 2017-12-18 LAB — TROPONIN I
TROPONIN I: 0.4 ng/mL — AB (ref <0.03)
TROPONIN I: 0.17 ng/mL — AB (ref <0.03)

## 2017-12-18 LAB — ECHOCARDIOGRAM LIMITED
HEIGHTINCHES: 64 in
Weight: 2793.67 oz

## 2017-12-18 MED ORDER — IPRATROPIUM BROMIDE 0.02 % IN SOLN: 0.5000 mg | Freq: Four times a day (QID) | RESPIRATORY_TRACT | Status: DC

## 2017-12-18 MED ORDER — ONDANSETRON HCL 4 MG/2ML IJ SOLN: 4.0000 mg | Freq: Four times a day (QID) | INTRAMUSCULAR | Status: DC | PRN

## 2017-12-18 MED ORDER — PANTOPRAZOLE SODIUM 40 MG PO TBEC
40.0000 mg | DELAYED_RELEASE_TABLET | Freq: Every day | ORAL | Status: DC
  Administered 2017-12-18 – 2017-12-21 (×4): 40 mg via ORAL
  Filled 2017-12-18 (×4): qty 1

## 2017-12-18 MED ORDER — FUROSEMIDE 40 MG PO TABS: 40.0000 mg | ORAL_TABLET | Freq: Every day | ORAL | Status: DC

## 2017-12-18 MED ORDER — ENOXAPARIN SODIUM 40 MG/0.4ML ~~LOC~~ SOLN
40.0000 mg | SUBCUTANEOUS | Status: DC
  Administered 2017-12-18 – 2017-12-19 (×2): 40 mg via SUBCUTANEOUS
  Filled 2017-12-18 (×2): qty 0.4

## 2017-12-18 MED ORDER — AMLODIPINE BESYLATE 5 MG PO TABS
5.0000 mg | ORAL_TABLET | Freq: Every day | ORAL | Status: DC
  Administered 2017-12-19 – 2017-12-20 (×2): 5 mg via ORAL
  Filled 2017-12-18 (×3): qty 1

## 2017-12-18 MED ORDER — OMEGA-3-ACID ETHYL ESTERS 1 G PO CAPS
2.0000 g | ORAL_CAPSULE | Freq: Two times a day (BID) | ORAL | Status: DC
  Administered 2017-12-18 – 2017-12-21 (×7): 2 g via ORAL
  Filled 2017-12-18 (×7): qty 2

## 2017-12-18 MED ORDER — CLOPIDOGREL BISULFATE 75 MG PO TABS
75.0000 mg | ORAL_TABLET | Freq: Every day | ORAL | Status: DC
  Administered 2017-12-18 – 2017-12-21 (×4): 75 mg via ORAL
  Filled 2017-12-18 (×4): qty 1

## 2017-12-18 MED ORDER — VANCOMYCIN HCL IN DEXTROSE 1-5 GM/200ML-% IV SOLN
1000.0000 mg | INTRAVENOUS | Status: DC
  Administered 2017-12-18: 1000 mg via INTRAVENOUS
  Filled 2017-12-18: qty 200

## 2017-12-18 MED ORDER — BUPROPION HCL ER (SR) 150 MG PO TB12
150.0000 mg | ORAL_TABLET | Freq: Two times a day (BID) | ORAL | Status: DC
  Administered 2017-12-18 – 2017-12-21 (×7): 150 mg via ORAL
  Filled 2017-12-18 (×7): qty 1

## 2017-12-18 MED ORDER — FAMOTIDINE 20 MG PO TABS
20.0000 mg | ORAL_TABLET | Freq: Two times a day (BID) | ORAL | Status: DC
  Administered 2017-12-18: 20 mg via ORAL
  Filled 2017-12-18: qty 1

## 2017-12-18 MED ORDER — ACETAMINOPHEN 650 MG RE SUPP: 650.0000 mg | Freq: Four times a day (QID) | RECTAL | Status: DC | PRN

## 2017-12-18 MED ORDER — IPRATROPIUM-ALBUTEROL 0.5-2.5 (3) MG/3ML IN SOLN
3.0000 mL | Freq: Four times a day (QID) | RESPIRATORY_TRACT | Status: DC
  Administered 2017-12-18 – 2017-12-19 (×6): 3 mL via RESPIRATORY_TRACT
  Filled 2017-12-18 (×6): qty 3

## 2017-12-18 MED ORDER — LINAGLIPTIN 5 MG PO TABS
5.0000 mg | ORAL_TABLET | Freq: Every day | ORAL | Status: DC
  Administered 2017-12-18 – 2017-12-21 (×3): 5 mg via ORAL
  Filled 2017-12-18 (×4): qty 1

## 2017-12-18 MED ORDER — ASPIRIN EC 81 MG PO TBEC
162.0000 mg | DELAYED_RELEASE_TABLET | Freq: Every day | ORAL | Status: DC
  Administered 2017-12-18 – 2017-12-21 (×4): 162 mg via ORAL
  Filled 2017-12-18 (×4): qty 2

## 2017-12-18 MED ORDER — ONDANSETRON HCL 4 MG PO TABS: 4.0000 mg | ORAL_TABLET | Freq: Four times a day (QID) | ORAL | Status: DC | PRN

## 2017-12-18 MED ORDER — ALBUTEROL SULFATE (2.5 MG/3ML) 0.083% IN NEBU: 2.5000 mg | INHALATION_SOLUTION | Freq: Four times a day (QID) | RESPIRATORY_TRACT | Status: DC

## 2017-12-18 MED ORDER — ALBUTEROL SULFATE (2.5 MG/3ML) 0.083% IN NEBU: 2.5000 mg | INHALATION_SOLUTION | RESPIRATORY_TRACT | Status: DC | PRN

## 2017-12-18 MED ORDER — IPRATROPIUM-ALBUTEROL 0.5-2.5 (3) MG/3ML IN SOLN: 3.0000 mL | Freq: Four times a day (QID) | RESPIRATORY_TRACT | Status: DC | PRN

## 2017-12-18 MED ORDER — PIPERACILLIN-TAZOBACTAM 3.375 G IVPB
3.3750 g | Freq: Three times a day (TID) | INTRAVENOUS | Status: DC
  Administered 2017-12-18 – 2017-12-19 (×4): 3.375 g via INTRAVENOUS
  Filled 2017-12-18 (×5): qty 50

## 2017-12-18 MED ORDER — METOPROLOL TARTRATE 25 MG PO TABS
12.5000 mg | ORAL_TABLET | Freq: Two times a day (BID) | ORAL | Status: DC
  Administered 2017-12-18 – 2017-12-21 (×7): 12.5 mg via ORAL
  Filled 2017-12-18 (×7): qty 1

## 2017-12-18 MED ORDER — ACETAMINOPHEN 325 MG PO TABS: 650.0000 mg | ORAL_TABLET | Freq: Four times a day (QID) | ORAL | Status: DC | PRN

## 2017-12-18 MED ORDER — ATORVASTATIN CALCIUM 40 MG PO TABS
80.0000 mg | ORAL_TABLET | Freq: Every day | ORAL | Status: DC
  Administered 2017-12-18 – 2017-12-21 (×4): 80 mg via ORAL
  Filled 2017-12-18: qty 4
  Filled 2017-12-18 (×3): qty 2

## 2017-12-18 MED ORDER — DM-GUAIFENESIN ER 30-600 MG PO TB12
1.0000 | ORAL_TABLET | Freq: Two times a day (BID) | ORAL | Status: DC
  Administered 2017-12-18 – 2017-12-21 (×7): 1 via ORAL
  Filled 2017-12-18 (×7): qty 1

## 2017-12-18 MED ORDER — LINACLOTIDE 145 MCG PO CAPS
145.0000 ug | ORAL_CAPSULE | Freq: Every day | ORAL | Status: DC
  Administered 2017-12-18 – 2017-12-21 (×4): 145 ug via ORAL
  Filled 2017-12-18 (×4): qty 1

## 2017-12-18 MED ORDER — FUROSEMIDE 10 MG/ML IJ SOLN
40.0000 mg | Freq: Every day | INTRAMUSCULAR | Status: DC
  Administered 2017-12-18: 40 mg via INTRAVENOUS
  Filled 2017-12-18 (×2): qty 4

## 2017-12-18 MED ORDER — FAMOTIDINE 20 MG PO TABS
20.0000 mg | ORAL_TABLET | Freq: Every day | ORAL | Status: DC
  Administered 2017-12-19 – 2017-12-21 (×3): 20 mg via ORAL
  Filled 2017-12-18 (×3): qty 1

## 2017-12-18 NOTE — Progress Notes (Signed)
Progress Note    Erica [name]  CXK:[id number] DOB: 1951/06/30  DOA: 12/17/2017 PCP: Iona Beard, MD    Brief Narrative:   Chief complaint: Follow-up shortness of breath  Medical records reviewed and are as summarized below:  Erica [name] is an 67 y.o. female unspecified CHF, chronic back pain, with a PMH of osteoarthritis, chronic respiratory failure secondary to COPD on home oxygen, type 2 diabetes, hypertension, history of CVA who was admitted 12/17/17 for evaluation of shortness of breath associated with cough and fatigue.  EMS noted that her oxygen saturation was 70% on 3 L and placed her on a nonrebreather mask.  Upon evaluation in the ED, she was febrile with a temperature of 102.5 F, pulse 105, respirations 27.  She was placed on bronchodilators, Tylenol, Solu-Medrol, and given a dose of vancomycin and Zosyn.  Assessment/Plan:   Principal Problem:   Acute on chronic respiratory failure (HCC) secondary to COPD exacerbation Patient was admitted to the stepdown unit, continued on supplemental oxygen and prescribed bronchodilators, Solu-Medrol, vancomycin and Zosyn. Lactic acid not elevated, which is reassuring. Follow-up blood cultures.  Active Problems:   Benign essential HTN Blood pressure stable.  Continue metoprolol.    GERD (gastroesophageal reflux disease) Continue Protonix.    Tobacco abuse Tobacco cessation counseling.  Declined nicotine patch.  Continue bupropion.    Type 2 diabetes mellitus with hyperglycemia (HCC) Hemoglobin A1c on 12/08/17 was 4.7%.    Elevated troponin Likely from demand ischemia in the setting of acute respiratory failure and tachycardia. EKG personally reviewed and shows NSR at 84 bpm with ST depressions in the inferior leads. Second troponin trending down. 2-D echo reviewed and shows EF 60-65 percent with normal wall motion. Evaluated by cardiology with recommendations for outpatient noninvasive study when stable.   Hypomagnesemia Magnesium replaced.    Hyperlipidemia Continue atorvastatin.  Body mass index is 29.97 kg/m.   Family Communication/Anticipated D/C date and plan/Code Status   DVT prophylaxis: Lovenox ordered. Code Status: Full Code.  Family Communication: Family updated at bedside. Disposition Plan: Home when fever curve down and she can be successfully transitioned to oral antibiotics/steroids.   Medical Consultants:    Cardiology   Anti-Infectives:    Vancomycin 12/17/17--->  Zosyn 12/17/17--->  Subjective:   The patient reports that she continues to wheeze but is feeling a bit less short of breath. Denies any chest pain or nausea.  Objective:    Vitals:   12/18/17 0600 12/18/17 0700 12/18/17 0745 12/18/17 0800  BP: (!) 147/61 (!) 144/55    Pulse: 72 65  81  Resp: 15 (!) 32  (!) 26  Temp:   98 F (36.7 C)   TempSrc:   Oral   SpO2: 98% 96%  (!) 87%  Weight:      Height:        Intake/Output Summary (Last 24 hours) at 12/18/2017 0840 Last data filed at 12/18/2017 0500 Gross per 24 hour  Intake 300 ml  Output 400 ml  Net -100 ml   Filed Weights   12/17/17 1732 12/17/17 2349 12/18/17 0500  Weight: 82.6 kg (182 lb) 79.2 kg (174 lb 9.7 oz) 79.2 kg (174 lb 9.7 oz)    Exam: General: No acute distress. Cardiovascular: Heart sounds show a regular rate, and rhythm. No gallops or rubs. No murmurs. No JVD. Lungs: Diffuse expiratory wheezes.. Abdomen: Soft, nontender, nondistended with normal active bowel sounds. No masses. No hepatosplenomegaly. Neurological: Alert and oriented 3. Moves all extremities  4 with equal strength. Cranial nerves II through XII grossly intact. Skin: Warm and dry. No rashes or lesions. Extremities: No clubbing or cyanosis. No edema. Pedal pulses 2+. Psychiatric: Mood and affect are normal. Insight and judgment are normal.   Data Reviewed:   I have personally reviewed following labs and imaging studies:  Labs: Labs show the  following:   Basic Metabolic Panel: Recent Labs  Lab 12/17/17 1739 12/17/17 1750 12/18/17 0128  NA 139 141  --   K 3.1* 3.2*  --   CL 97* 95*  --   CO2 30  --   --   GLUCOSE 160* 153*  --   BUN 27* 22*  --   CREATININE 1.65* 1.60*  --   CALCIUM 7.6*  --   --   MG 1.1*  --   --   PHOS  --   --  5.6*   GFR Estimated Creatinine Clearance: 34.3 mL/min (A) (by C-G formula based on SCr of 1.6 mg/dL (H)). Liver Function Tests: Recent Labs  Lab 12/17/17 1739  AST 17  ALT 29  ALKPHOS 66  BILITOT 0.5  PROT 6.7  ALBUMIN 3.5   CBC: Recent Labs  Lab 12/17/17 1739 12/17/17 1750  WBC 11.7*  --   NEUTROABS 9.3*  --   HGB 10.3* 12.2  HCT 34.3* 36.0  MCV 80.9  --   PLT 256  --    Cardiac Enzymes: Recent Labs  Lab 12/17/17 2123 12/18/17 0128  TROPONINI 0.53* 0.40*   Sepsis Labs: Recent Labs  Lab 12/17/17 1739 12/17/17 1750 12/17/17 1953  WBC 11.7*  --   --   LATICACIDVEN  --  1.55 0.42*    Microbiology Recent Results (from the past 240 hour(s))  Blood Culture (routine x 2)     Status: None (Preliminary result)   Collection Time: 12/17/17  5:56 PM  Result Value Ref Range Status   Specimen Description RIGHT ANTECUBITAL  Final   Special Requests   Final    BOTTLES DRAWN AEROBIC AND ANAEROBIC Blood Culture adequate volume   Culture   Final    NO GROWTH < 24 HOURS Performed at [facility], [address] [place], [place] [address]    Report Status PENDING  Incomplete  Blood Culture (routine x 2)     Status: None (Preliminary result)   Collection Time: 12/17/17  6:00 PM  Result Value Ref Range Status   Specimen Description RIGHT ANTECUBITAL  Final   Special Requests   Final    BOTTLES DRAWN AEROBIC AND ANAEROBIC Blood Culture adequate volume   Culture   Final    NO GROWTH < 24 HOURS Performed at [facility], [address] [place], [place] [address]    Report Status PENDING  Incomplete    Procedures and diagnostic studies:  Dg Chest Portable 1  View  Result Date: 12/17/2017 CLINICAL DATA:  Shortness of breath, recent pneumonia EXAM: PORTABLE CHEST 1 VIEW COMPARISON:  12/07/2017 FINDINGS: Cardiomegaly with vascular congestion. Diffuse interstitial prominence throughout the lungs compatible with interstitial edema, similar prior study. Possible small effusions. No acute bony abnormality. IMPRESSION: Mild pulmonary edema. Possible small effusions. No real change since prior study. Electronically Signed   By: Rolm Baptise M.D.   On: 12/17/2017 18:11    Medications:   . amLODipine  5 mg Oral Daily  . aspirin EC  162 mg Oral Daily  . atorvastatin  80 mg Oral Daily  . buPROPion  150 mg Oral  BID  . clopidogrel  75 mg Oral Daily  . enoxaparin (LOVENOX) injection  40 mg Subcutaneous Q24H  . famotidine  20 mg Oral BID  . furosemide  40 mg Intravenous Daily  . ipratropium-albuterol  3 mL Nebulization Q6H  . linaclotide  145 mcg Oral Daily  . linagliptin  5 mg Oral Daily  . methylPREDNISolone (SOLU-MEDROL) injection  40 mg Intravenous Q6H   Followed by  . [START ON 12/19/2017] predniSONE  40 mg Oral Q supper  . metoprolol tartrate  12.5 mg Oral BID  . omega-3 acid ethyl esters  2 g Oral BID  . pantoprazole  40 mg Oral Daily   Continuous Infusions: . piperacillin-tazobactam 3.375 g (12/18/17 0754)  . vancomycin       LOS: 1 day   [place] [facility] Pager [phone]. If unable to reach me by pager, please call my cell phone at [phone].  *Please refer to [email or web address], password [email or web address] to get updated schedule on who will round on this patient, as hospitalists switch teams weekly. If 7PM-7AM, please contact night-coverage at [email or web address], password [email or web address] for any overnight needs.  12/18/2017, 8:40 AM

## 2017-12-18 NOTE — Progress Notes (Signed)
Pharmacy Antibiotic Note  Erica [name] is a 67 y.o. female admitted on 12/17/2017 with sepsis.  Pharmacy has been consulted for Vancomcyin dosing. Patient is also on Zosyn  Plan: Vancomycin 1000 mg IV every 24  hours.  Goal trough 15-20 mcg/mL. Zosyn 3.375g IV q8h (4 hour infusion).  Monitor labs, c/s, and Vanco trough as needed  Height: 5\' 4"  (162.6 cm) Weight: 174 lb 9.7 oz (79.2 kg) IBW/kg (Calculated) : 54.7  Temp (24hrs), Avg:99 F (37.2 C), Min:97.9 F (36.6 C), Max:102.5 F (39.2 C)  Recent Labs  Lab 12/17/17 1739 12/17/17 1750 12/17/17 1953  WBC 11.7*  --   --   CREATININE 1.65* 1.60*  --   LATICACIDVEN  --  1.55 0.42*    Estimated Creatinine Clearance: 34.3 mL/min (A) (by C-G formula based on SCr of 1.6 mg/dL (H)).    No Known Allergies  Antimicrobials this admission: Vanco 3/26 >>  Zosyn 3/26 >>   Dose adjustments this admission: N/A  Microbiology results: 3/25 BCx: NG x 24 hours   Thank you for allowing pharmacy to be a part of this patient's care.  Ramond Craver 12/18/2017 8:38 AM

## 2017-12-18 NOTE — Patient Outreach (Addendum)
[facility] ([facility]) Care Management  12/18/2017  Darah S Shapley 04/23/1951 [id number]   Transition of care week one/Re admission to hospital   [facility] CM consulted by [facility] hospital liaison, J Minor after pt re admitted to hospital 12/17/17 with sob, low saturation   Plans CM will contact Mrs Kunin after discharge home for transition of care services Route to Primary MD  [facility] CM Care Plan Problem One     Most Recent Value  Care Plan Problem One  transition of care   Role Documenting the Problem One  Care Management Coordinator  Care Plan for Problem One  Active  [facility] Long Term Goal   over the next 45 days the patient will remain out of hospital with assist of transition of care managment and education for home care for congestive HF, review Congestive HF action plan  [facility] Long Term Goal Start Date  12/12/17  [facility] Long Term Goal Met Date  12/17/17  Interventions for Problem One Long Term Goal  will follow for transition of care after hospital d/c        Kimberly L. Gibbs, RN, BSN, CCM [facility] Care Management Care Coordinator [phone] week day mobile   

## 2017-12-18 NOTE — Progress Notes (Signed)
*  PRELIMINARY RESULTS* Echocardiogram 2D Echocardiogram limited has been performed.  Erica [name] 12/18/2017, 10:57 AM

## 2017-12-18 NOTE — Progress Notes (Signed)
Inpatient Diabetes Program Recommendations  AACE/ADA: New Consensus Statement on Inpatient Glycemic Control (2015)  Target Ranges:  Prepandial:   less than 140 mg/dL      Peak postprandial:   less than 180 mg/dL (1-2 hours)      Critically ill patients:  140 - 180 mg/dL   Results for Erica [name], Erica [name] (MRN [id number]) as of 12/18/2017 08:32  Ref. Range 12/17/2017 17:50  Glucose Latest Ref Range: 65 - 99 mg/dL 153 (H)   Results for KISMET, FACEMIRE (MRN [id number]) as of 12/18/2017 08:32  Ref. Range 12/08/2017 07:32  Hemoglobin A1C Latest Ref Range: 4.8 - 5.6 % 4.7 (L)    Admit with: SOB  History: DM, CHF  Home DM Meds: Onglyza 5 mg daily  Current Insulin Orders: Tradjenta 5 mg daily      MD- Note patient getting Solumedrol 40 mg Q6 hours.  Will switch to Prednisone tomorrow.  Please consider placing orders for Novolog Sensitive Correction Scale/ SSI (0-9 units) TID AC + HS       --Will follow patient during hospitalization--  Wyn Quaker RN, MSN, CDE Diabetes Coordinator Inpatient Glycemic Control Team Team Pager: [phone] (8a-5p)

## 2017-12-18 NOTE — Consult Note (Signed)
   [facility] CM Inpatient Consult   12/18/2017  IRELAND VIRRUETA Dec 26, 1950 [id number]   Patient is currently active with [facility] for chronic disease management services.  Patient has been engaged by a [employer].  Our community based plan of care has focused on disease management and community resource support.  Patient will receive a post discharge transition of care call and will be evaluated for monthly home visits for assessments and disease process education.  Made Inpatient Case Manager aware that [facility] following. Of note, [facility] Care Management services does not replace or interfere with any services that are needed or arranged by inpatient case management or social work.  For additional questions or referrals please contact:  Millan Legan RN, [facility] Liaison  [phone]) [place] [phone]) Toll free office

## 2017-12-18 NOTE — Consult Note (Addendum)
Cardiology Consult    Patient ID: Erica [name]; [id number]; 03-16-1951   Admit date: 12/17/2017 Date of Consult: 12/18/2017  Primary Care Provider: Iona Beard, MD Primary Cardiologist: New to [facility] - Dr. Harl Bowie  Patient Profile    Erica [name] is a 67 y.o. female with past medical history of chronic hypoxic respiratory failure  (on 2L [place] at baseline), chronic diastolic CHF, HTN, HLD, Type 2 DM, Stage 3 CKD, pSVT, prior CVA, and tobacco use who is being seen today for the evaluation of elevated troponin at the request of Dr. Olevia Bowens.   History of Present Illness    Erica [name] was recently admitted to [facility] from 3/15 - 12/10/2017 for worsening dyspnea on exertion and at rest. She initially required BiPAP and was later transitioned back to nasal cannula following diuresis with IV Lasix.  She was discharged home on PO Lasix 40 mg daily with weight of 180 lbs at the time of discharge.   She presented back to [facility] on 12/17/2017 for worsening shortness of breath and oxygen saturations were in the 70's (on 3L [place]) upon EMS arrival, requiring NRB.  Says that her breathing had been at baseline but acutely worsened yesterday and was worse with exertion and at rest.  She notes associated palpitations with the dyspnea but denies any chest discomfort.  No recent orthopnea, PND, or lower extremity edema.  Reports weight has been stable at 177-179 lbs on her home scales.   She denies any personal history of CAD. Does have HTN, HLD, Type 2 DM, and history of tobacco use (40+ pack-year history). Continues to smokes 1 ppd. No known family history of CAD and says her mother lived to be 70 years old.   The patient was initially febrile to 102.5 (now at 36 F). Initial labs showed WBC 11.7, Hgb 10.3, platelets 256, Na+ 139, K+ 3.1, and creatinine 1.65 (at 1.7 - 1.8 during recent admission). BNP elevated at 583. Mg 1.1. Initial troponin 0.53 with subsequent value at 0.40. CXR shows mild pulmonary  edema with possible small effusions. EKG shows normal sinus rhythm, heart rate 84, with ST depression along inferior leads which is more prominent now when compared to prior tracings.  She has been admitted for acute respiratory failure and started on vancomycin and Zosyn for possible sepsis.  Has also been placed on IV steroids and nebulizer treatments.  Past Medical History:  Diagnosis Date  . Arthritis   . CHF (congestive heart failure) ([place])   . Chronic back pain   . COPD (chronic obstructive pulmonary disease) (H[facility])    2L home O2  . Diabetes mellitus without complication ([place])   . Hyperlipidemia   . Hypertension   . Stroke (cerebrum) [facility])     Past Surgical History:  Procedure Laterality Date  . ABDOMINAL HYSTERECTOMY    . colonoscopy  2009   Dr. Gala Romney: cluster of diminutive rectal polyps and 2 diminutive polyps in rectosigmoid junction, hyperplastic  . COLONOSCOPY WITH PROPOFOL N/A 10/18/2017   Procedure: COLONOSCOPY WITH PROPOFOL;  Surgeon: Daneil Dolin, MD;  Location: [facility];  Service: Endoscopy;  Laterality: N/A;  9:30am  . ESOPHAGOGASTRODUODENOSCOPY (EGD) WITH PROPOFOL N/A 08/29/2017   Dr. Gala Romney with propofol: small hiatal hernia. 4cm fundal diverticulum.   Marland Kitchen left hand middle finger     in an accident at work, amputated     Home Medications:  Prior to Admission medications   Medication Sig Start Date End Date Taking?  Authorizing Provider  amLODipine (NORVASC) 5 MG tablet Take 1 tablet (5 mg total) by mouth daily. 08/31/17  Yes TatShanon Brow, MD  aspirin 81 MG tablet Take 2 tablets by mouth daily.  10/22/15  Yes [provider]  atorvastatin (LIPITOR) 80 MG tablet Take 80 mg by mouth daily.  12/04/16  Yes [provider]  budesonide-formoterol (SYMBICORT) 160-4.5 MCG/ACT inhaler Inhale 2 puffs into the lungs 2 (two) times daily. 11/23/15  Yes Barton Dubois, MD  buPROPion [facility] SR) 150 MG 12 hr tablet Take 150 mg by mouth 2 (two) times daily.  12/04/16  Yes [provider]  clopidogrel (PLAVIX) 75 MG tablet Take 75 mg by mouth daily.   Yes [provider]  famotidine (PEPCID) 20 MG tablet Take 20 mg by mouth 2 (two) times daily.   Yes [provider]  furosemide (LASIX) 40 MG tablet Take 1 tablet (40 mg total) by mouth daily. 12/11/17  Yes Tat, Shanon Brow, MD  ipratropium-albuterol (DUONEB) 0.5-2.5 (3) MG/3ML SOLN Take 3 mLs by nebulization every 6 (six) hours as needed (for shortness of breath).   Yes [provider]  Linaclotide (LINZESS) 145 MCG CAPS capsule Take 145 mcg by mouth daily.   Yes [provider]  metoprolol tartrate (LOPRESSOR) 25 MG tablet Take 0.5 tablets (12.5 mg total) by mouth 2 (two) times daily. 08/30/17  Yes Tat, Shanon Brow, MD  omega-3 acid ethyl esters (LOVAZA) 1 g capsule Take 2 g by mouth 2 (two) times daily.  12/07/16  Yes [provider]  omeprazole (PRILOSEC) 20 MG capsule Take 20 mg by mouth daily. 12/04/16  Yes [provider]  OXYGEN Inhale 2 L into the lungs continuous.   Yes [provider]  saxagliptin HCl (ONGLYZA) 5 MG TABS tablet Take 5 mg by mouth daily. Reported on 09/22/2015   Yes [provider]  SPIRIVA HANDIHALER 18 MCG inhalation capsule Take 1 puff by mouth daily. 10/31/17  Yes [provider]  predniSONE (DELTASONE) 10 MG tablet Take 6 tablets (60 mg total) by mouth daily with breakfast. And decrease by one tablet daily Patient not taking: Reported on 12/17/2017 12/11/17   Orson Eva, MD    Inpatient Medications: Scheduled Meds: . amLODipine  5 mg Oral Daily  . aspirin EC  162 mg Oral Daily  . atorvastatin  80 mg Oral Daily  . buPROPion  150 mg Oral BID  . clopidogrel  75 mg Oral Daily  . enoxaparin (LOVENOX) injection  40 mg Subcutaneous Q24H  . famotidine  20 mg Oral BID  . furosemide  40 mg Oral Daily  . ipratropium-albuterol  3 mL Nebulization Q6H  . linaclotide  145 mcg Oral Daily  . linagliptin  5 mg  Oral Daily  . methylPREDNISolone (SOLU-MEDROL) injection  40 mg Intravenous Q6H   Followed by  . [START ON 12/19/2017] predniSONE  40 mg Oral Q supper  . metoprolol tartrate  12.5 mg Oral BID  . omega-3 acid ethyl esters  2 g Oral BID  . pantoprazole  40 mg Oral Daily   Continuous Infusions: . piperacillin-tazobactam     PRN Meds: acetaminophen **OR** acetaminophen, albuterol, ondansetron **OR** ondansetron (ZOFRAN) IV  Allergies:   No Known Allergies  Social History:   Social History   Socioeconomic History  . Marital status: Divorced    Spouse name: Not on file  . Number of children: Not on file  . Years of education: Not on file  . Highest education level: Not  on file  Occupational History  . Occupation: retired  Scientific laboratory technician  . Financial resource strain: Not on file  . Food insecurity:    Worry: Not on file    Inability: Not on file  . Transportation needs:    Medical: Not on file    Non-medical: Not on file  Tobacco Use  . Smoking status: Former Smoker    Packs/day: 1.00    Years: 52.00    Pack years: 52.00    Types: Cigarettes    Last attempt to quit: 09/23/2017    Years since quitting: 0.2  . Smokeless tobacco: Never Used  Substance and Sexual Activity  . Alcohol use: No  . Drug use: No  . Sexual activity: Not Currently  Lifestyle  . Physical activity:    Days per week: Not on file    Minutes per session: Not on file  . Stress: Not on file  Relationships  . Social connections:    Talks on phone: Not on file    Gets together: Not on file    Attends religious service: Not on file    Active member of club or organization: Not on file    Attends meetings of clubs or organizations: Not on file    Relationship status: Not on file  . Intimate partner violence:    Fear of current or ex partner: Not on file    Emotionally abused: Not on file    Physically abused: Not on file    Forced sexual activity: Not on file  Other Topics Concern  . Not on file    Social History Narrative   ** Merged History Encounter **         Family History:    Family History  Problem Relation Age of Onset  . CVA Mother 40  . Cancer Neg Hx   . Colon cancer Neg Hx       Review of Systems    General:  No chills, fever, night sweats or weight changes.  Cardiovascular:  No chest pain, edema, orthopnea, palpitations, paroxysmal nocturnal dyspnea. Positive for dyspnea on exertion.  Dermatological: No rash, lesions/masses Respiratory: Positive for productive cough and dyspnea.  Urologic: No hematuria, dysuria Abdominal:   No nausea, vomiting, diarrhea, bright red blood per rectum, melena, or hematemesis Neurologic:  No visual changes, wkns, changes in mental status. All other systems reviewed and are otherwise negative except as noted above.  Physical Exam/Data    Vitals:   12/18/17 0300 12/18/17 0400 12/18/17 0500 12/18/17 0600  BP: (!) 125/51 (!) 129/49 (!) 133/53 (!) 147/61  Pulse: 66 65 66 72  Resp: (!) 22 (!) 22 (!) 25 15  Temp:  98.8 F (37.1 C)    TempSrc:  Oral    SpO2: 94% 95% 95% 98%  Weight:   174 lb 9.7 oz (79.2 kg)   Height:        Intake/Output Summary (Last 24 hours) at 12/18/2017 0733 Last data filed at 12/18/2017 0500 Gross per 24 hour  Intake 300 ml  Output 400 ml  Net -100 ml   Filed Weights   12/17/17 1732 12/17/17 2349 12/18/17 0500  Weight: 182 lb (82.6 kg) 174 lb 9.7 oz (79.2 kg) 174 lb 9.7 oz (79.2 kg)   Body mass index is 29.97 kg/m.   General: Pleasant, African American female appearing in NAD Psych: Normal affect. Neuro: Alert and oriented X 3. Moves all extremities spontaneously. HEENT: Normal  Neck: Supple without bruits or JVD.  Lungs:  Resp regular and unlabored, wheezing throughout upper lung fields. Heart: RRR no s3, s4, or murmurs. Abdomen: Soft, non-tender, non-distended, BS + x 4.  Extremities: No clubbing, cyanosis or lower extremity edema. DP/PT/Radials 2+ and equal bilaterally.   EKG:  The EKG  was personally reviewed and demonstrates:  normal sinus rhythm, heart rate 84, with ST depression along inferior leads which is more prominent now when compared to prior tracings.  Telemetry:  Telemetry was personally reviewed and demonstrates: NSR, HR in 60's to 80's with no ectopic events.    Labs/Studies     Relevant CV Studies:  Echocardiogram: 08/2017 Study Conclusions  - Left ventricle: The cavity size was normal. Wall thickness was   increased in a pattern of mild LVH. Systolic function was normal.   The estimated ejection fraction was in the range of 60% to 65%.   Wall motion was normal; there were no regional wall motion   abnormalities. The study is not technically sufficient to allow   evaluation of LV diastolic function. - Mitral valve: There was mild regurgitation.  Laboratory Data:  Chemistry Recent Labs  Lab 12/17/17 1739 12/17/17 1750  NA 139 141  K 3.1* 3.2*  CL 97* 95*  CO2 30  --   GLUCOSE 160* 153*  BUN 27* 22*  CREATININE 1.65* 1.60*  CALCIUM 7.6*  --   GFRNONAA 31*  --   GFRAA 36*  --   ANIONGAP 12  --     Recent Labs  Lab 12/17/17 1739  PROT 6.7  ALBUMIN 3.5  AST 17  ALT 29  ALKPHOS 66  BILITOT 0.5   Hematology Recent Labs  Lab 12/17/17 1739 12/17/17 1750  WBC 11.7*  --   RBC 4.24  --   HGB 10.3* 12.2  HCT 34.3* 36.0  MCV 80.9  --   MCH 24.3*  --   MCHC 30.0  --   RDW 18.1*  --   PLT 256  --    Cardiac Enzymes Recent Labs  Lab 12/17/17 2123 12/18/17 0128  TROPONINI 0.53* 0.40*    Recent Labs  Lab 12/17/17 1754  TROPIPOC 0.30*    BNP Recent Labs  Lab 12/17/17 1738  BNP 583.0*    DDimer No results for input(s): DDIMER in the last 168 hours.  Radiology/Studies:  Dg Chest Portable 1 View  Result Date: 12/17/2017 CLINICAL DATA:  Shortness of breath, recent pneumonia EXAM: PORTABLE CHEST 1 VIEW COMPARISON:  12/07/2017 FINDINGS: Cardiomegaly with vascular congestion. Diffuse interstitial prominence throughout the  lungs compatible with interstitial edema, similar prior study. Possible small effusions. No acute bony abnormality. IMPRESSION: Mild pulmonary edema. Possible small effusions. No real change since prior study. Electronically Signed   By: Rolm Baptise M.D.   On: 12/17/2017 18:11     Assessment & Plan    1. Elevated Troponin  - presented with acute worsening of her respiratory status within one day. Denies any associated chest discomfort. No recent orthopnea, PND, or lower extremity edema. Weight has been stable on her home scales.  - Initial troponin elevated to 0.53 with subsequent value at 0.40. EKG shows normal sinus rhythm, HR 84, with ST depression along inferior leads which is more prominent now when compared to prior tracings. Most recent echo in 08/2017 showed a preserved EF with no regional WMA.  - would recommend obtaining a limited echo to reassess EF and wall motion. Pending echo results, can consider further ischemic evaluation once respiratory status improves as she  does have multiple cardiac risk factors including HTN, HLD, Type 2 DM, and continued tobacco use.  - remains on ASA and Plavix (previously started due to prior CVA) along with BB and statin therapy.   2. Acute on Chronic Respiratory Failure/ Acute on Chronic Diastolic CHF - Likely multifactorial in the setting of COPD and CHF exacerbation. BNP was elevated to 583 on admission and CXR showed pulmonary edema. - she does have wheezing and decreased breath sounds on examination. Would continue on IV Lasix 40mg  daily. Monitor I&O's along with daily weights. Was noted to be febrile up to 102.5 on admission. Consider checking Influenza Panel given her acute presentation. Remains on Zosyn.   3. HTN - BP has been variable at 106/37- 149/70 since admission. At 144/55 on most recent check. - continue PTA Amlodipine and Lopressor 12.5mg  BID. Can further titrate if needed.  4. HLD - followed by PCP. Remains on Atorvastatin 80mg   daily.   5. Stage 3 CKD - Creatinine variable from 1.7-1.8 during recent admission. Improved to 1.60 this morning. Continue to follow with IV diuresis.  6. Tobacco Use -S he continues to smoke 1 pack per day. Cessation was advised.   For questions or updates, please contact [place] Please consult [email or web address] for contact info under Cardiology/STEMI.  Signed, Erma Heritage, PA-C 12/18/2017, 7:33 AM Pager: [phone]   Attending note Patient seen and discussed with PA Ahmed Prima, I agree with her documentation above. 67 yo female history of COPD on home O2 with admission earlier this month for exacerbation, DM2, HTn, CVA admitted with SOB and cough and hypoxia. In ER found to be febrile to 102.5 and WBC of 11.7   BNP 583 K 3.1 Cr 1.65 WBC 11.7 Hgb 10.3 Plt 256 Mg 1.1 Lactic acid 1.5 Trop 0.30-->0.53-->0.40 12/07/2017 VQ negative CXR mild pulm edema 122018 echo LVEF 60-65%, no WMAs, cannot eval diastolic function EKG SR, nonspecific ST/T changes which have been present on prior EKGs  Patient presents with fever, leukocytosis, severe hypoxia with initial sats in the 70s, and SOB. In this setting mild troponin elevation that has now peaked. Presentation would most likely be caused by demand ischemia. We will f/u echo results to evaluate for any new dysfunction. Likely pursue noninvasive ischemic evaluation once her acute medical issues are improved. Limited stress options as given her COPD and current exacerbation would be a poor candidate for regadenson, with elevated trop would avoid dobutamine or exercise during the admission, likely will pursue outpatient study.  Continue medical therapy with ASA, atorva, plavix (on since prior CVA), lopressor, statin. No ace/arb due to poor renal function. Do not see strong indication for anticoagulation.   Carlyle Dolly MD

## 2017-12-18 NOTE — H&P (Signed)
History and Physical    Erica [name]:[id number] DOB: 11-07-1950 DOA: 12/17/2017  PCP: Iona Beard, MD   Patient coming from: Home.  I have personally briefly reviewed patient's old medical records in [place]  Chief Complaint: Shortness of breath.  HPI: Erica [name] is a 67 y.o. female with medical history significant of osteoarthritis, unspecified CHF, chronic back pain, COPD on home oxygen, type 2 diabetes without complication, hypertension, history of CVA who is coming to the emergency department with complaints of shortness of breath associated with cough and fatigue since last night.  Per EMS, apparently the patient had a nasal cannula at home, but it was not connected to her oxygen tank.  Her O2 sat initially was 70% on 3 LPM, but subsequently placed on NRB.  She denies fever, chills, headache, sore throat, rhinorrhea, hemoptysis, chest pain, palpitations, dizziness, diaphoresis, PND, orthopnea or recent pitting edema of the lower extremities.  No abdominal pain, nausea, emesis, diarrhea or constipation.  No melena or hematochezia.  Denies dysuria, frequency or hematuria.  No heat or cold intolerance.  Denies polyphagia, polydipsia or polyuria.  ED Course: Initial vital signs temperature 39.2C (102.5 F), pulse 105, respirations 27, blood pressure 138/70 mmHg and O2 sat 97% on nonrebreather oxygen mask.  The patient received bronchodilators, acetaminophen, Solu-Medrol 125 mg IV PB, furosemide 20 mg IVP x1, potassium 40 mEq p.o. x1, Zosyn and vancomycin in the emergency department.  Her workup shows a white count of 11.7 with 80% neutrophils, 9% lymphocytes and 11% monocytes.  Her hemoglobin was 10.3 g/dL and platelets 256.  Sodium was 139, potassium 3.1, chloride 97 and CO2 30 mmol/L.  Glucose 160, BUN 27, creatinine 1.65, magnesium 1.1 lactic acid was 0.42, and calcium 7.6 mg/dL.  Her LFTs were within normal limits.  Initial troponin was 0.30 and repeat was 0.53 ng/mL EKG  showed minimal ST depression on diffuse leads.  Imaging: A 1 view chest radiograph showed cardiomegaly with mild pulmonary edema without significant change from previous studies.  Review of Systems: As per HPI otherwise 10 point review of systems negative.    Past Medical History:  Diagnosis Date  . Arthritis   . CHF (congestive heart failure) ([place])   . Chronic back pain   . COPD (chronic obstructive pulmonary disease) (HCC)    2L home O2  . Diabetes mellitus without complication ([place])   . Hypertension   . Stroke (cerebrum) [facility])     Past Surgical History:  Procedure Laterality Date  . ABDOMINAL HYSTERECTOMY    . colonoscopy  2009   Dr. Gala Romney: cluster of diminutive rectal polyps and 2 diminutive polyps in rectosigmoid junction, hyperplastic  . COLONOSCOPY WITH PROPOFOL N/A 10/18/2017   Procedure: COLONOSCOPY WITH PROPOFOL;  Surgeon: Daneil Dolin, MD;  Location: [facility];  Service: Endoscopy;  Laterality: N/A;  9:30am  . ESOPHAGOGASTRODUODENOSCOPY (EGD) WITH PROPOFOL N/A 08/29/2017   Dr. Gala Romney with propofol: small hiatal hernia. 4cm fundal diverticulum.   Marland Kitchen left hand middle finger     in an accident at work, amputated     reports that she quit smoking about 2 months ago. Her smoking use included cigarettes. She has a 52.00 pack-year smoking history. She has never used smokeless tobacco. She reports that she does not drink alcohol or use drugs.  No Known Allergies  Family History  Problem Relation Age of Onset  . CVA Mother 1  . Cancer Neg Hx   . Colon cancer Neg Hx  Prior to Admission medications   Medication Sig Start Date End Date Taking? Authorizing Provider  amLODipine (NORVASC) 5 MG tablet Take 1 tablet (5 mg total) by mouth daily. 08/31/17  Yes TatShanon Brow, MD  aspirin 81 MG tablet Take 2 tablets by mouth daily.  10/22/15  Yes [provider]  atorvastatin (LIPITOR) 80 MG tablet Take 80 mg by mouth daily.  12/04/16  Yes [provider]    budesonide-formoterol (SYMBICORT) 160-4.5 MCG/ACT inhaler Inhale 2 puffs into the lungs 2 (two) times daily. 11/23/15  Yes Barton Dubois, MD  buPROPion [facility] SR) 150 MG 12 hr tablet Take 150 mg by mouth 2 (two) times daily. 12/04/16  Yes [provider]  clopidogrel (PLAVIX) 75 MG tablet Take 75 mg by mouth daily.   Yes [provider]  famotidine (PEPCID) 20 MG tablet Take 20 mg by mouth 2 (two) times daily.   Yes [provider]  furosemide (LASIX) 40 MG tablet Take 1 tablet (40 mg total) by mouth daily. 12/11/17  Yes Tat, Shanon Brow, MD  ipratropium-albuterol (DUONEB) 0.5-2.5 (3) MG/3ML SOLN Take 3 mLs by nebulization every 6 (six) hours as needed (for shortness of breath).   Yes [provider]  Linaclotide (LINZESS) 145 MCG CAPS capsule Take 145 mcg by mouth daily.   Yes [provider]  metoprolol tartrate (LOPRESSOR) 25 MG tablet Take 0.5 tablets (12.5 mg total) by mouth 2 (two) times daily. 08/30/17  Yes Tat, Shanon Brow, MD  omega-3 acid ethyl esters (LOVAZA) 1 g capsule Take 2 g by mouth 2 (two) times daily.  12/07/16  Yes [provider]  omeprazole (PRILOSEC) 20 MG capsule Take 20 mg by mouth daily. 12/04/16  Yes [provider]  OXYGEN Inhale 2 L into the lungs continuous.   Yes [provider]  saxagliptin HCl (ONGLYZA) 5 MG TABS tablet Take 5 mg by mouth daily. Reported on 09/22/2015   Yes [provider]  SPIRIVA HANDIHALER 18 MCG inhalation capsule Take 1 puff by mouth daily. 10/31/17  Yes [provider]  predniSONE (DELTASONE) 10 MG tablet Take 6 tablets (60 mg total) by mouth daily with breakfast. And decrease by one tablet daily Patient not taking: Reported on 12/17/2017 12/11/17   Erica Eva, MD    Physical Exam: Vitals:   12/18/17 0100 12/18/17 0213 12/18/17 0400 12/18/17 0500  BP: 140/61     Pulse: 67     Resp: (!) 23     Temp:   98.8 F (37.1 C)   TempSrc:   Oral   SpO2: 95% 96%     Weight:    79.2 kg (174 lb 9.7 oz)  Height:        Constitutional: NAD, calm, comfortable Eyes: PERRL, lids and conjunctivae normal ENMT: Mucous membranes are moist. Posterior pharynx clear of any exudate or lesions. Neck: normal, supple, no masses, no thyromegaly Respiratory: Decreased breath sounds with wheezing and rhonchi bilaterally, no crackles. Normal respiratory effort. No accessory muscle use.  Cardiovascular: Regular rate and rhythm, no murmurs / rubs / gallops. No extremity edema. 2+ pedal pulses. No carotid bruits.  Abdomen: Obese, soft, no tenderness, no masses palpated. No hepatosplenomegaly. Bowel sounds positive.  Musculoskeletal: no clubbing / cyanosis. Good ROM, no contractures. Normal muscle tone.  Skin: no rashes, lesions, ulcers on very limited skin examination per Neurologic: CN 2-12 grossly intact. Sensation intact, DTR normal. Strength 5/5 in all 4.  Psychiatric: Normal judgment and insight. Alert and oriented x 3. Normal mood.  Labs on Admission: I have personally reviewed following labs and imaging studies  CBC: Recent Labs  Lab 12/17/17 1739 12/17/17 1750  WBC 11.7*  --   NEUTROABS 9.3*  --   HGB 10.3* 12.2  HCT 34.3* 36.0  MCV 80.9  --   PLT 256  --    Basic Metabolic Panel: Recent Labs  Lab 12/17/17 1739 12/17/17 1750 12/18/17 0128  NA 139 141  --   K 3.1* 3.2*  --   CL 97* 95*  --   CO2 30  --   --   GLUCOSE 160* 153*  --   BUN 27* 22*  --   CREATININE 1.65* 1.60*  --   CALCIUM 7.6*  --   --   MG 1.1*  --   --   PHOS  --   --  5.6*   GFR: Estimated Creatinine Clearance: 34.3 mL/min (A) (by C-G formula based on SCr of 1.6 mg/dL (H)). Liver Function Tests: Recent Labs  Lab 12/17/17 1739  AST 17  ALT 29  ALKPHOS 66  BILITOT 0.5  PROT 6.7  ALBUMIN 3.5   No results for input(s): LIPASE, AMYLASE in the last 168 hours. No results for input(s): AMMONIA in the last 168 hours. Coagulation Profile: No results for input(s): INR,  PROTIME in the last 168 hours. Cardiac Enzymes: Recent Labs  Lab 12/17/17 2123 12/18/17 0128  TROPONINI 0.53* 0.40*   BNP (last 3 results) No results for input(s): PROBNP in the last 8760 hours. HbA1C: No results for input(s): HGBA1C in the last 72 hours. CBG: No results for input(s): GLUCAP in the last 168 hours. Lipid Profile: No results for input(s): CHOL, HDL, LDLCALC, TRIG, CHOLHDL, LDLDIRECT in the last 72 hours. Thyroid Function Tests: No results for input(s): TSH, T4TOTAL, FREET4, T3FREE, THYROIDAB in the last 72 hours. Anemia Panel: No results for input(s): VITAMINB12, FOLATE, FERRITIN, TIBC, IRON, RETICCTPCT in the last 72 hours. Urine analysis:    Component Value Date/Time   COLORURINE YELLOW 03/14/2013 1847   APPEARANCEUR CLEAR 03/14/2013 1847   LABSPEC 1.025 03/14/2013 1847   PHURINE 6.0 03/14/2013 1847   GLUCOSEU NEGATIVE 03/14/2013 1847   HGBUR NEGATIVE 03/14/2013 1847   BILIRUBINUR NEGATIVE 03/14/2013 1847   KETONESUR NEGATIVE 03/14/2013 1847   PROTEINUR NEGATIVE 03/14/2013 1847   UROBILINOGEN 0.2 03/14/2013 1847   NITRITE NEGATIVE 03/14/2013 1847   LEUKOCYTESUR NEGATIVE 03/14/2013 1847    Radiological Exams on Admission: Dg Chest Portable 1 View  Result Date: 12/17/2017 CLINICAL DATA:  Shortness of breath, recent pneumonia EXAM: PORTABLE CHEST 1 VIEW COMPARISON:  12/07/2017 FINDINGS: Cardiomegaly with vascular congestion. Diffuse interstitial prominence throughout the lungs compatible with interstitial edema, similar prior study. Possible small effusions. No acute bony abnormality. IMPRESSION: Mild pulmonary edema. Possible small effusions. No real change since prior study. Electronically Signed   By: Rolm Baptise M.D.   On: 12/17/2017 18:11   08/29/2017 echocardiogram complete ------------------------------------------------------------------- LV EF: 60% -   65%  ------------------------------------------------------------------- Indications:       Supraventricular tachycardia 427.0.  ------------------------------------------------------------------- History:   PMH:  Elevated troponin. Acquired from the patient and from the patient&'s chart.  Chronic obstructive pulmonary disease. PMH:  GERD (gastroesophageal reflux disease) (HFpEF) heart failure with preserved ejection fraction.  Risk factors:  Tobacco abuse. Hypertension. Diabetes mellitus.  ------------------------------------------------------------------- Study Conclusions  - Left ventricle: The cavity size was normal. Wall thickness was   increased in a pattern of mild LVH. Systolic function was normal.   The estimated  ejection fraction was in the range of 60% to 65%.   Wall motion was normal; there were no regional wall motion   abnormalities. The study is not technically sufficient to allow   evaluation of LV diastolic function. - Mitral valve: There was mild regurgitation.  -------------------------------------------------------------------  EKG: Independently reviewed.  Vent. rate 84 BPM PR interval * ms QRS duration 122 ms QT/QTc 363/430 ms P-R-T axes 72 72 52 Sinus rhythm Right atrial enlargement Nonspecific intraventricular conduction delay Minimal ST depression, diffuse leads  Assessment/Plan Principal Problem:   Acute respiratory failure (HCC) Admit to stepdown/inpatient. Continue supplemental oxygen. DuoNeb every 6 hours. Albuterol neb every 4 hours as needed. Solu-Medrol 40 mg IVP every 6 hours x4 doses. Please start oral prednisone or continue Solu-Medrol on 12/19/2017. Continue vancomycin and Zosyn for possible sepsis. BiPAP ventilation as needed.  Active Problems:   Elevated troponin Trend troponin level. Consult cardiology in a.m.    Hypomagnesemia Replaced.    Benign essential HTN Continue metoprolol 12.5 mg p.o. twice daily. Monitor blood pressure and heart rate.    GERD (gastroesophageal reflux disease) Protonix 40 mg p.o.  daily.    Tobacco abuse  Declined nicotine replacement therapy. She is on bupropion 150 mg p.o. twice daily. Staff to provide smoking cessation information.    Type 2 diabetes mellitus with hyperglycemia (HCC) Last hemoglobin A1c 4.7% on 12/08/2017. Carbohydrate modified diet. CBG monitoring with regular insulin sliding scale.    Hyperlipidemia Continue atorvastatin 80 mg p.o. daily.     DVT prophylaxis: Lovenox SQ. Code Status: Full code. Family Communication:  Disposition Plan: Admit for acute respiratory failure treatment. Consults called:  Admission status: Inpatient/stepdown.   Reubin Milan MD [facility] Pager [phone].  If 7PM-7AM, please contact night-coverage [email or web address] Password [facility]  12/18/2017, 5:26 AM

## 2017-12-18 NOTE — Progress Notes (Signed)
Nutrition Brief Note  Nutritional screen performas as part of the COPD Gold order set.   Patient is requesting diet lib to heart healthy only w/ removal of carb restrictions to improve PO intake. Noted last a1c 4.7, though BGs now 150-250.   Wt Readings from Last 15 Encounters:  12/18/17 174 lb 9.7 oz (79.2 kg)  12/10/17 179 lb 3.7 oz (81.3 kg)  10/18/17 176 lb (79.8 kg)  10/03/17 179 lb (81.2 kg)  09/26/17 167 lb (75.8 kg)  08/27/17 175 lb 11.2 oz (79.7 kg)  02/13/17 173 lb 14.4 oz (78.9 kg)  12/18/16 180 lb (81.6 kg)  01/04/16 167 lb (75.8 kg)  11/23/15 158 lb 1.1 oz (71.7 kg)  09/06/15 159 lb 11.2 oz (72.4 kg)  11/12/13 182 lb (82.6 kg)  03/14/13 189 lb (85.7 kg)   Patient reports that she has not had any changes in her normal dietary habits or her appetite. At baseline, she does NOT follow any type of therapeutic diet, she does NOT drink oral supplements and she does take mineral supplements (Iron) regularly.   She says her UBW is 179 lbs. Her weight is slightly below this, but she has been receiving IV diuretics and some loss would be expected.   She says the last few days have been "rough", but maintains that she still was able to eat. Intake did not decrease.   At this time, she still reports a good appetite. The only thing she is requesting to help promote intake is diet liberalization to receive regular sugar. She does not like the sweeteners. While her BGs have been elevated this is more likely due to stress/steroids. Her last A1C was 4.7.   No nutrition interventions warranted at this time. If nutrition issues arise, please consult RD.   Burtis Junes RD, LDN, CNSC Clinical Nutrition Available Tues-Sat via Pager: [phone] 12/18/2017 2:44 PM

## 2017-12-19 ENCOUNTER — Inpatient Hospital Stay (HOSPITAL_COMMUNITY): Payer: Medicare HMO

## 2017-12-19 ENCOUNTER — Ambulatory Visit: Payer: Self-pay | Admitting: *Deleted

## 2017-12-19 DIAGNOSIS — R0602 Shortness of breath: Secondary | ICD-10-CM

## 2017-12-19 DIAGNOSIS — J9601 Acute respiratory failure with hypoxia: Secondary | ICD-10-CM

## 2017-12-19 LAB — RESPIRATORY PANEL BY PCR
Adenovirus: NOT DETECTED
BORDETELLA PERTUSSIS-RVPCR: NOT DETECTED
CHLAMYDOPHILA PNEUMONIAE-RVPPCR: NOT DETECTED
Coronavirus 229E: NOT DETECTED
Coronavirus HKU1: NOT DETECTED
Coronavirus NL63: NOT DETECTED
Coronavirus OC43: NOT DETECTED
INFLUENZA A-RVPPCR: NOT DETECTED
Influenza B: NOT DETECTED
Metapneumovirus: NOT DETECTED
Mycoplasma pneumoniae: NOT DETECTED
PARAINFLUENZA VIRUS 4-RVPPCR: NOT DETECTED
Parainfluenza Virus 1: NOT DETECTED
Parainfluenza Virus 2: NOT DETECTED
Parainfluenza Virus 3: DETECTED — AB
Respiratory Syncytial Virus: NOT DETECTED
Rhinovirus / Enterovirus: NOT DETECTED

## 2017-12-19 LAB — BASIC METABOLIC PANEL
ANION GAP: 15 (ref 5–15)
BUN: 35 mg/dL — AB (ref 6–20)
CHLORIDE: 98 mmol/L — AB (ref 101–111)
CO2: 30 mmol/L (ref 22–32)
Calcium: 7.7 mg/dL — ABNORMAL LOW (ref 8.9–10.3)
Creatinine, Ser: 2.17 mg/dL — ABNORMAL HIGH (ref 0.44–1.00)
GFR calc Af Amer: 26 mL/min — ABNORMAL LOW (ref >60)
GFR calc non Af Amer: 22 mL/min — ABNORMAL LOW (ref >60)
Glucose, Bld: 156 mg/dL — ABNORMAL HIGH (ref 65–99)
POTASSIUM: 3.8 mmol/L (ref 3.5–5.1)
SODIUM: 143 mmol/L (ref 135–145)

## 2017-12-19 LAB — CBC WITH DIFFERENTIAL/PLATELET
Basophils Absolute: 0 10*3/uL (ref 0.0–0.1)
Basophils Relative: 0 %
Eosinophils Absolute: 0 10*3/uL (ref 0.0–0.7)
Eosinophils Relative: 0 %
HCT: 33.4 % — ABNORMAL LOW (ref 36.0–46.0)
Hemoglobin: 9.9 g/dL — ABNORMAL LOW (ref 12.0–15.0)
LYMPHS ABS: 0.8 10*3/uL (ref 0.7–4.0)
LYMPHS PCT: 9 %
MCH: 24.1 pg — AB (ref 26.0–34.0)
MCHC: 29.6 g/dL — ABNORMAL LOW (ref 30.0–36.0)
MCV: 81.3 fL (ref 78.0–100.0)
Monocytes Absolute: 0.3 10*3/uL (ref 0.1–1.0)
Monocytes Relative: 3 %
Neutro Abs: 8 10*3/uL — ABNORMAL HIGH (ref 1.7–7.7)
Neutrophils Relative %: 88 %
Platelets: 264 10*3/uL (ref 150–400)
RBC: 4.11 MIL/uL (ref 3.87–5.11)
RDW: 18.3 % — ABNORMAL HIGH (ref 11.5–15.5)
WBC: 9.1 10*3/uL (ref 4.0–10.5)

## 2017-12-19 LAB — MAGNESIUM: MAGNESIUM: 2.2 mg/dL (ref 1.7–2.4)

## 2017-12-19 MED ORDER — ENOXAPARIN SODIUM 30 MG/0.3ML ~~LOC~~ SOLN
30.0000 mg | SUBCUTANEOUS | Status: DC
  Administered 2017-12-20 – 2017-12-21 (×2): 30 mg via SUBCUTANEOUS
  Filled 2017-12-19 (×2): qty 0.3

## 2017-12-19 MED ORDER — AZITHROMYCIN 250 MG PO TABS
250.0000 mg | ORAL_TABLET | Freq: Every day | ORAL | Status: DC
  Administered 2017-12-20 – 2017-12-21 (×2): 250 mg via ORAL
  Filled 2017-12-19 (×2): qty 1

## 2017-12-19 MED ORDER — AZITHROMYCIN 250 MG PO TABS
500.0000 mg | ORAL_TABLET | Freq: Every day | ORAL | Status: AC
  Administered 2017-12-19: 500 mg via ORAL
  Filled 2017-12-19: qty 2

## 2017-12-19 MED ORDER — IPRATROPIUM-ALBUTEROL 0.5-2.5 (3) MG/3ML IN SOLN
3.0000 mL | Freq: Three times a day (TID) | RESPIRATORY_TRACT | Status: DC
  Administered 2017-12-19 – 2017-12-21 (×6): 3 mL via RESPIRATORY_TRACT
  Filled 2017-12-19 (×6): qty 3

## 2017-12-19 NOTE — Progress Notes (Signed)
Inpatient Diabetes Program Recommendations  AACE/ADA: New Consensus Statement on Inpatient Glycemic Control (2015)  Target Ranges:  Prepandial:   less than 140 mg/dL      Peak postprandial:   less than 180 mg/dL (1-2 hours)      Critically ill patients:  140 - 180 mg/dL   Lab Results  Component Value Date   GLUCAP 171 (H) 12/10/2017   HGBA1C 4.7 (L) 12/08/2017    Review of Glycemic Control  Admit with: SOB  History: DM, CHF  Home DM Meds: Onglyza 5 mg daily  Current Insulin Orders: Tradjenta 5 mg daily  Inpatient Diabetes Program Recommendations:    Please consider placing orders for Novolog Sensitive Correction Scale/ SSI (0-9 units) TID AC + HS  Thank you, Bethena Roys E. Belem Hintze, RN, MSN, CDE  Diabetes Coordinator Inpatient Glycemic Control Team Team Pager [phone] (8am-5pm) 12/19/2017 2:37 PM

## 2017-12-19 NOTE — Care Management Note (Signed)
Case Management Note  Patient Details  Name: Erica [name] MRN: [id number] Date of Birth: [birth year]  Subjective/Objective:    Admitted with CHF/COPD Pt is from home, ind pta. She has PCP, transportation and insurance with drug coverage. She is on oxygen pta. She is active with [facility].                  Action/Plan: CM following for needs. Anticipate DC home with self care and continue with [facility] services.   Expected Discharge Date:  12/20/17               Expected Discharge Plan:     In-House Referral:     Discharge planning Services  CM Consult  Post Acute Care Choice:    Choice offered to:     DME Arranged:    DME Agency:     HH Arranged:    HH Agency:     Status of Service:  In process, will continue to follow  If discussed at Long Length of Stay Meetings, dates discussed:    Additional Comments:  Erica [name], [name], RN 12/19/2017, 2:54 PM

## 2017-12-19 NOTE — Progress Notes (Addendum)
Progress Note  Patient Name: Erica [name] Date of Encounter: 12/19/2017  Primary Cardiologist: Carlyle Dolly, MD   Subjective   Breathing improved. Says she is overall feeling much better. No chest pain or palpitations.   Inpatient Medications    Scheduled Meds: . amLODipine  5 mg Oral Daily  . aspirin EC  162 mg Oral Daily  . atorvastatin  80 mg Oral Daily  . buPROPion  150 mg Oral BID  . clopidogrel  75 mg Oral Daily  . dextromethorphan-guaiFENesin  1 tablet Oral BID  . enoxaparin (LOVENOX) injection  40 mg Subcutaneous Q24H  . famotidine  20 mg Oral Daily  . furosemide  40 mg Intravenous Daily  . ipratropium-albuterol  3 mL Nebulization Q6H  . linaclotide  145 mcg Oral Daily  . linagliptin  5 mg Oral Daily  . metoprolol tartrate  12.5 mg Oral BID  . omega-3 acid ethyl esters  2 g Oral BID  . pantoprazole  40 mg Oral Daily  . predniSONE  40 mg Oral Q supper   Continuous Infusions: . piperacillin-tazobactam 3.375 g (12/19/17 0730)  . vancomycin Stopped (12/18/17 1912)   PRN Meds: acetaminophen **OR** acetaminophen, albuterol, ondansetron **OR** ondansetron (ZOFRAN) IV   Vital Signs    Vitals:   12/19/17 0500 12/19/17 0600 12/19/17 0700 12/19/17 0744  BP: (!) 146/54 (!) 122/57 (!) 137/59   Pulse: 77 61 (!) 58   Resp: (!) 23 (!) 31 (!) 36   Temp:      TempSrc:      SpO2: 93% 98% 100% 97%  Weight: 172 lb 13.5 oz (78.4 kg)     Height:        Intake/Output Summary (Last 24 hours) at 12/19/2017 0746 Last data filed at 12/19/2017 0500 Gross per 24 hour  Intake 50 ml  Output 750 ml  Net -700 ml   Filed Weights   12/17/17 2349 12/18/17 0500 12/19/17 0500  Weight: 174 lb 9.7 oz (79.2 kg) 174 lb 9.7 oz (79.2 kg) 172 lb 13.5 oz (78.4 kg)    Telemetry    NSR, HR in 60's to 80's. No ectopic events.  - Personally Reviewed  ECG    No new tracings.   Physical Exam   General: Well developed, well nourished African American female appearing in no acute  distress. Head: Normocephalic, atraumatic.  Neck: Supple without bruits, JVD not elevated. Lungs:  Resp regular and unlabored, expiratory wheezing along upper lung fields bilaterally. Heart: RRR, S1, S2, no S3, S4, or murmur; no rub. Abdomen: Soft, non-tender, non-distended with normoactive bowel sounds. No hepatomegaly. No rebound/guarding. No obvious abdominal masses. Extremities: No clubbing, cyanosis, or edema. Distal pedal pulses are 2+ bilaterally. Neuro: Alert and oriented X 3. Moves all extremities spontaneously. Psych: Normal affect.  Labs    Chemistry Recent Labs  Lab 12/17/17 1739 12/17/17 1750 12/18/17 0924  NA 139 141 140  K 3.1* 3.2* 4.4  CL 97* 95* 99*  CO2 30  --  26  GLUCOSE 160* 153* 255*  BUN 27* 22* 30*  CREATININE 1.65* 1.60* 1.85*  CALCIUM 7.6*  --  7.6*  PROT 6.7  --   --   ALBUMIN 3.5  --   --   AST 17  --   --   ALT 29  --   --   ALKPHOS 66  --   --   BILITOT 0.5  --   --   GFRNONAA 31*  --  27*  GFRAA 36*  --  31*  ANIONGAP 12  --  15     Hematology Recent Labs  Lab 12/17/17 1739 12/17/17 1750 12/18/17 1000 12/19/17 0421  WBC 11.7*  --  6.2 9.1  RBC 4.24  --  4.34 4.11  HGB 10.3* 12.2 10.5* 9.9*  HCT 34.3* 36.0 35.2* 33.4*  MCV 80.9  --  81.1 81.3  MCH 24.3*  --  24.2* 24.1*  MCHC 30.0  --  29.8* 29.6*  RDW 18.1*  --  18.1* 18.3*  PLT 256  --  219 264    Cardiac Enzymes Recent Labs  Lab 12/17/17 2123 12/18/17 0128 12/18/17 0924  TROPONINI 0.53* 0.40* 0.17*    Recent Labs  Lab 12/17/17 1754  TROPIPOC 0.30*     BNP Recent Labs  Lab 12/17/17 1738  BNP 583.0*     DDimer No results for input(s): DDIMER in the last 168 hours.   Radiology    Dg Chest Portable 1 View  Result Date: 12/17/2017 CLINICAL DATA:  Shortness of breath, recent pneumonia EXAM: PORTABLE CHEST 1 VIEW COMPARISON:  12/07/2017 FINDINGS: Cardiomegaly with vascular congestion. Diffuse interstitial prominence throughout the lungs compatible with  interstitial edema, similar prior study. Possible small effusions. No acute bony abnormality. IMPRESSION: Mild pulmonary edema. Possible small effusions. No real change since prior study. Electronically Signed   By: Rolm Baptise M.D.   On: 12/17/2017 18:11    Cardiac Studies   Echocardiogram: 08/2017 Study Conclusions  - Left ventricle: The cavity size was normal. Wall thickness was   increased in a pattern of mild LVH. Systolic function was normal.   The estimated ejection fraction was in the range of 60% to 65%.   Wall motion was normal; there were no regional wall motion   abnormalities. The study is not technically sufficient to allow   evaluation of LV diastolic function. - Mitral valve: There was mild regurgitation.  Limited Echo: 12/18/2017 Study Conclusions  - Limited echo to evaluate LV function. - Left ventricle: The cavity size was normal. Wall thickness was   normal. Systolic function was normal. The estimated ejection   fraction was in the range of 60% to 65%. Wall motion was normal;   there were no regional wall motion abnormalities. - Aortic valve: Valve area (VTI): 2.17 cm^2. Valve area (Vmax):   2.14 cm^2. - Mitral valve: There was mild regurgitation.   Patient Profile     67 y.o. female w/ PMH of chronic hypoxic respiratory failure (on 2L [place] at baseline), chronic diastolic CHF, HTN, HLD, Type 2 DM, Stage 3 CKD, pSVT, prior CVA, and tobacco use who presented to [facility] on 12/17/2017 foe worsening dyspnea and found to be hypoxic with saturations in the 70's. Admitted with acute on chronic respiratory failure. Cardiology consulted for elevated troponin values.   Assessment & Plan      1. Elevated Troponin  - presented with acute worsening of her respiratory status within one day. Has experienced worsening dyspnea but denies any chest discomfort. - Initial troponin elevated to 0.53 with subsequent values at 0.40 and 0.17. EKG shows nonspecific ST abnormalities  along inferior leads. Flat enzyme trend is likely secondary to demand ischemia in the setting of her acute respiratory failure.  - a repeat limited echo has been obtained and shows a preserved EF of 60-65% with no regional WMA, overall being reassuring. With her multiple cardiac risk factors (HTN, HLD, Type 2 DM, and continued tobacco use), would plan for an  outpatient ischemic evaluation once she improves from her acute illness.   - remains on ASA and Plavix (previously started due to prior CVA) along with BB and statin therapy.   2. Acute on Chronic Respiratory Failure/ Acute on Chronic Diastolic CHF - Likely multifactorial in the setting of COPD and CHF exacerbation. BNP was elevated to 583 on admission and CXR showed pulmonary edema. Remains on Vanc and Zosyn for COPD exacerbation.  - currently on IV Lasix 40mg  daily with an overall recorded output of -800 mL with weight having declined by 2 lbs. Anticipate switching back to PTA PO Lasix 40mg  daily tomorrow.   3. HTN - BP variable at 102/46 - 162/119 within the past 24 hours. At 137/59 this AM.  - continue PTA Amlodipine 5mg  daily and Lopressor 12.5mg  BID. Can further titrate if needed. Not on ACE-I or ARB given variable renal function.   4. HLD - followed by PCP. Remains on Atorvastatin 80mg  daily.   5. Stage 3 CKD - Creatinine variable from 1.7-1.8 during recent admission. At 1.85 yesterday morning. Will add-on repeat BMET to morning labs.   6. Tobacco Use - smokes approximately 1 pack per day. Cessation advised.   For questions or updates, please contact [place] Please consult [email or web address] for contact info under Cardiology/STEMI.   Arna Medici , PA-C 7:46 AM 12/19/2017 Pager: [phone]  Attending note  Patient seen and discussed with PA Erica [name], I agree with her documentation above. 67 yo female history of COPD on home O2 with admission earlier this month for exacerbation, DM2, HTn, CVA admitted  with SOB and cough and hypoxia. In ER found to be febrile to 102.5 and WBC of 11.7  Patient presents with fever, leukocytosis, severe hypoxia with initial sats in the 70s, and SOB. In this setting mild troponin elevation that has now peaked. Presentation would most likely be caused by demand ischemia. Echo shows normal LVEF 60-65%, no WMAs. No chest pain, EKG without acute changes. No further inpatient workup planned at this time, can consider outpatient stress testing pending her symptoms course once her acute medical illness has resolved. With AKI would hold diuretics, resume oral lasix when possible. We will sign off inpatient care.   Carlyle Dolly MD

## 2017-12-19 NOTE — Progress Notes (Signed)
[facility] PROGRESS NOTE  Erica [name] EXB:[id number] DOB: 02/11/1951 DOA: 12/17/2017 PCP: Iona Beard, MD  Brief summary   67 y.o. female unspecified CHF, chronic back pain, with a PMH of osteoarthritis, chronic respiratory failure secondary to COPD on home oxygen, type 2 diabetes, hypertension, history of CVA who was admitted 12/17/17 for evaluation of shortness of breath associated with cough and fatigue.  EMS noted that her oxygen saturation was 70% on 3 L and placed her on a nonrebreather mask.  Upon evaluation in the ED, she was febrile with a temperature of 102.5 F, pulse 105, respirations 27.  She was placed on bronchodilators, Tylenol, Solu-Medrol, and given a dose of vancomycin and Zosyn.    Assessment/Plan:  Acute on chronic respiratory failure (HCC) secondary to COPD exacerbation. Patient was admitted to the stepdown unit, continued on supplemental oxygen and prescribed bronchodilators, Solu-Medrol, vancomycin and Zosyn. Lactic acid not elevated, which is reassuring. Follow-up blood cultures: NGTD. Will discontinue vanc/zosyn due renal issues. Patient is afebrile. Transition to z pack. Will taper steroids, cont bronchodilators. Patient with h/o occupational lung exposure, tobacco use, questionable underlying pulmonary fibrosis. Will obtain high res CT  Benign essential HTN Blood pressure stable.  Continue metoprolol.  GERD (gastroesophageal reflux disease). Continue Protonix.  Tobacco abuse. Tobacco cessation counseling.  Declined nicotine patch.  Continue bupropion.  Type 2 diabetes mellitus with hyperglycemia (HCC) Hemoglobin A1c on 12/08/17 was 4.7%.  Elevated troponin. Likely from demand ischemia in the setting of acute respiratory failure and tachycardia. EKG personally reviewed and shows NSR at 84 bpm with ST depressions in the inferior leads. Second troponin trending down. 2-D echo reviewed and shows EF 60-65 percent with normal wall motion. Evaluated by  cardiology with recommendations for outpatient noninvasive study when stable. Will hold lasix today due to worsening creatinine, no s/s of further acute HF. Will resume oral lasix am. Appreciate cardiology input   Hypomagnesemia. Magnesium replaced.  Hyperlipidemia Continue atorvastatin.  Body mass index is 29.97 kg/m.    Code Status: full Family Communication: d/w patient, RN (indicate person spoken with, relationship, and if by phone, the number) Disposition Plan: home in 24-48 hrs    Consultants:  Cardiology   Procedures:  Echo   Antibiotics: Anti-infectives (From admission, onward)   Start     Dose/Rate Route Frequency Ordered Stop   12/18/17 1800  vancomycin (VANCOCIN) IVPB 1000 mg/200 mL premix     1,000 mg 200 mL/hr over 60 Minutes Intravenous Every 24 hours 12/18/17 0828     12/18/17 0615  piperacillin-tazobactam (ZOSYN) IVPB 3.375 g     3.375 g 12.5 mL/hr over 240 Minutes Intravenous Every 8 hours 12/18/17 0609     12/17/17 1800  vancomycin (VANCOCIN) IVPB 1000 mg/200 mL premix     1,000 mg 200 mL/hr over 60 Minutes Intravenous  Once 12/17/17 1753 12/17/17 1941   12/17/17 1800  piperacillin-tazobactam (ZOSYN) IVPB 3.375 g     3.375 g 100 mL/hr over 30 Minutes Intravenous  Once 12/17/17 1753 12/17/17 1835        (indicate start date, and stop date if known)  HPI/Subjective: Alert. Reports cough with yellow sputum. No acute dyspnea.   Objective: Vitals:   12/19/17 0700 12/19/17 0744  BP: (!) 137/59   Pulse: (!) 58   Resp: (!) 36   Temp:    SpO2: 100% 97%    Intake/Output Summary (Last 24 hours) at 12/19/2017 0911 Last data filed at 12/19/2017 0500 Gross per 24 hour  Intake 50  ml  Output 750 ml  Net -700 ml   Filed Weights   12/17/17 2349 12/18/17 0500 12/19/17 0500  Weight: 79.2 kg (174 lb 9.7 oz) 79.2 kg (174 lb 9.7 oz) 78.4 kg (172 lb 13.5 oz)    Exam:   General:  No distress   Cardiovascular: s1,s2 rrr  Respiratory: diminished in  LL  Abdomen: soft, nt, nd  Musculoskeletal: no leg edema    Data Reviewed: Basic Metabolic Panel: Recent Labs  Lab 12/17/17 1739 12/17/17 1750 12/18/17 0128 12/18/17 0924 12/19/17 0421  NA 139 141  --  140 143  K 3.1* 3.2*  --  4.4 3.8  CL 97* 95*  --  99* 98*  CO2 30  --   --  26 30  GLUCOSE 160* 153*  --  255* 156*  BUN 27* 22*  --  30* 35*  CREATININE 1.65* 1.60*  --  1.85* 2.17*  CALCIUM 7.6*  --   --  7.6* 7.7*  MG 1.1*  --   --   --  2.2  PHOS  --   --  5.6*  --   --    Liver Function Tests: Recent Labs  Lab 12/17/17 1739  AST 17  ALT 29  ALKPHOS 66  BILITOT 0.5  PROT 6.7  ALBUMIN 3.5   No results for input(s): LIPASE, AMYLASE in the last 168 hours. No results for input(s): AMMONIA in the last 168 hours. CBC: Recent Labs  Lab 12/17/17 1739 12/17/17 1750 12/18/17 1000 12/19/17 0421  WBC 11.7*  --  6.2 9.1  NEUTROABS 9.3*  --  5.8 8.0*  HGB 10.3* 12.2 10.5* 9.9*  HCT 34.3* 36.0 35.2* 33.4*  MCV 80.9  --  81.1 81.3  PLT 256  --  219 264   Cardiac Enzymes: Recent Labs  Lab 12/17/17 2123 12/18/17 0128 12/18/17 0924  TROPONINI 0.53* 0.40* 0.17*   BNP (last 3 results) Recent Labs    09/19/17 1348 12/07/17 1218 12/17/17 1738  BNP 244.0* 155.0* 583.0*    ProBNP (last 3 results) No results for input(s): PROBNP in the last 8760 hours.  CBG: No results for input(s): GLUCAP in the last 168 hours.  Recent Results (from the past 240 hour(s))  Blood Culture (routine x 2)     Status: None (Preliminary result)   Collection Time: 12/17/17  5:56 PM  Result Value Ref Range Status   Specimen Description RIGHT ANTECUBITAL  Final   Special Requests   Final    BOTTLES DRAWN AEROBIC AND ANAEROBIC Blood Culture adequate volume   Culture   Final    NO GROWTH 2 DAYS Performed at [facility], [address], [place], [place] [address]    Report Status PENDING  Incomplete  Blood Culture (routine x 2)     Status: None (Preliminary result)   Collection  Time: 12/17/17  6:00 PM  Result Value Ref Range Status   Specimen Description RIGHT ANTECUBITAL  Final   Special Requests   Final    BOTTLES DRAWN AEROBIC AND ANAEROBIC Blood Culture adequate volume   Culture   Final    NO GROWTH 2 DAYS Performed at [facility], [address] [place],  [address]    Report Status PENDING  Incomplete     Studies: Dg Chest Portable 1 View  Result Date: 12/17/2017 CLINICAL DATA:  Shortness of breath, recent pneumonia EXAM: PORTABLE CHEST 1 VIEW COMPARISON:  12/07/2017 FINDINGS: Cardiomegaly with vascular congestion. Diffuse interstitial prominence throughout the  lungs compatible with interstitial edema, similar prior study. Possible small effusions. No acute bony abnormality. IMPRESSION: Mild pulmonary edema. Possible small effusions. No real change since prior study. Electronically Signed   By: Rolm Baptise M.D.   On: 12/17/2017 18:11    Scheduled Meds: . amLODipine  5 mg Oral Daily  . aspirin EC  162 mg Oral Daily  . atorvastatin  80 mg Oral Daily  . buPROPion  150 mg Oral BID  . clopidogrel  75 mg Oral Daily  . dextromethorphan-guaiFENesin  1 tablet Oral BID  . enoxaparin (LOVENOX) injection  40 mg Subcutaneous Q24H  . famotidine  20 mg Oral Daily  . furosemide  40 mg Intravenous Daily  . ipratropium-albuterol  3 mL Nebulization Q6H  . linaclotide  145 mcg Oral Daily  . linagliptin  5 mg Oral Daily  . metoprolol tartrate  12.5 mg Oral BID  . omega-3 acid ethyl esters  2 g Oral BID  . pantoprazole  40 mg Oral Daily  . predniSONE  40 mg Oral Q supper   Continuous Infusions: . piperacillin-tazobactam 3.375 g (12/19/17 0730)  . vancomycin Stopped (12/18/17 1912)    Principal Problem:   Acute respiratory failure (HCC) Active Problems:   COPD exacerbation (HCC)   Benign essential HTN   GERD (gastroesophageal reflux disease)   Tobacco abuse   Type 2 diabetes mellitus with hyperglycemia (HCC)   Elevated troponin   Hypomagnesemia    Hyperlipidemia    Time spent: >35 minutes     Kinnie Feil  [facility] Pager [phone]. If 7PM-7AM, please contact night-coverage at [email or web address], password [facility] 12/19/2017, 9:11 AM  LOS: 2 days

## 2017-12-20 DIAGNOSIS — I10 Essential (primary) hypertension: Secondary | ICD-10-CM

## 2017-12-20 DIAGNOSIS — N170 Acute kidney failure with tubular necrosis: Secondary | ICD-10-CM

## 2017-12-20 DIAGNOSIS — N183 Chronic kidney disease, stage 3 (moderate): Secondary | ICD-10-CM

## 2017-12-20 DIAGNOSIS — Z720 Tobacco use: Secondary | ICD-10-CM

## 2017-12-20 DIAGNOSIS — J441 Chronic obstructive pulmonary disease with (acute) exacerbation: Secondary | ICD-10-CM

## 2017-12-20 LAB — BASIC METABOLIC PANEL
Anion gap: 12 (ref 5–15)
BUN: 36 mg/dL — AB (ref 6–20)
CHLORIDE: 99 mmol/L — AB (ref 101–111)
CO2: 32 mmol/L (ref 22–32)
CREATININE: 1.94 mg/dL — AB (ref 0.44–1.00)
Calcium: 8.1 mg/dL — ABNORMAL LOW (ref 8.9–10.3)
GFR calc Af Amer: 29 mL/min — ABNORMAL LOW (ref >60)
GFR calc non Af Amer: 25 mL/min — ABNORMAL LOW (ref >60)
Glucose, Bld: 171 mg/dL — ABNORMAL HIGH (ref 65–99)
Potassium: 5 mmol/L (ref 3.5–5.1)
Sodium: 143 mmol/L (ref 135–145)

## 2017-12-20 LAB — CBC WITH DIFFERENTIAL/PLATELET
BASOS PCT: 0 %
Basophils Absolute: 0 10*3/uL (ref 0.0–0.1)
EOS PCT: 0 %
Eosinophils Absolute: 0 10*3/uL (ref 0.0–0.7)
HEMATOCRIT: 33.2 % — AB (ref 36.0–46.0)
HEMOGLOBIN: 9.9 g/dL — AB (ref 12.0–15.0)
LYMPHS PCT: 10 %
Lymphs Abs: 0.9 10*3/uL (ref 0.7–4.0)
MCH: 24.4 pg — ABNORMAL LOW (ref 26.0–34.0)
MCHC: 29.8 g/dL — AB (ref 30.0–36.0)
MCV: 82 fL (ref 78.0–100.0)
MONOS PCT: 3 %
Monocytes Absolute: 0.3 10*3/uL (ref 0.1–1.0)
NEUTROS ABS: 7.8 10*3/uL (ref 1.7–7.7)
Neutrophils Relative %: 87 %
Platelets: 280 10*3/uL (ref 150–400)
RBC: 4.05 MIL/uL (ref 3.87–5.11)
RDW: 18.6 % — AB (ref 11.5–15.5)
WBC: 9 10*3/uL (ref 4.0–10.5)

## 2017-12-20 NOTE — Progress Notes (Signed)
PROGRESS NOTE                                                                                                                                                                                                             Patient Demographics:    Erica [name], is a 67 y.o. female, DOB - 09/29/1950, ZOX:[id number]  Admit date - 12/17/2017   Admitting Physician Reubin Milan, MD  Outpatient Primary MD for the patient is Iona Beard, MD  LOS - 3  Outpatient Specialists: None  Chief Complaint  Patient presents with  . Shortness of Breath       Brief Narrative 67 year old female with chronic diastolic CHF, chronic back pain, chronic respiratory failure secondary to COPD on home O2, type 2 diabetes mellitus, hypertension and history of CVA was found by EMS to be hypoxic at 70% on 3 L nasal cannula and placed on nonrebreather and brought to the ED where she was found to be febrile at 102 F, tachycardic and tachypneic.  She was placed on IV Solu-Medrol, empiric antibiotics and bronchodilators with suspicion for COPD exacerbation with SIRS.    Subjective:   Reports of breathing to be much better.  (Feels 70% of her normal)   Assessment  & Plan :   Principal problem Acute on chronic respiratory failure with hypoxia Secondary to COPD exacerbation.  Improving with steroids which is being tapered, continue nebs, antibiotic narrowed to Zithromax.  Blood cultures have been negative. Symptoms improving.  Transfer to medical floor. Counseled strongly on smoking cessation.  Reports she has been quitting for past 3 weeks.  Elevated troponin Suspect demand ischemia.  2D echo with normal EF and no wall motion abnormality.  EKG unremarkable.  Lasix held due to worsening renal function.  Cardiology consult appreciated who recommends outpatient stress test.  Acute on chronic kidney disease (stage II-stage III) Baseline creatinine  around 1.6.  Possibly due to diuresis.  Lasix being held.  Slowly improving.  Resume upon discharge.  Type 2 diabetes mellitus with hyperglycemia. Monitor on sliding scale coverage.  Continue linagliptin.  A1c of 4.7.  Hypomagnesemia Replenished.    Code Status : Full code  Family Communication  : None at bedside  Disposition Plan  : Home possibly tomorrow if improved  Barriers For Discharge : Active symptoms  Consults  : Cardiology  Procedures  : 2D echo, HRCT  DVT Prophylaxis  : SQ lovenox  Lab Results  Component Value Date   PLT 280 12/20/2017    Antibiotics  :    Anti-infectives (From admission, onward)   Start     Dose/Rate Route Frequency Ordered Stop   12/20/17 1000  azithromycin (ZITHROMAX) tablet 250 mg     250 mg Oral Daily 12/19/17 0922 12/24/17 0959   12/19/17 1000  azithromycin (ZITHROMAX) tablet 500 mg     500 mg Oral Daily 12/19/17 0922 12/19/17 1002   12/18/17 1800  vancomycin (VANCOCIN) IVPB 1000 mg/200 mL premix  Status:  Discontinued     1,000 mg 200 mL/hr over 60 Minutes Intravenous Every 24 hours 12/18/17 0828 12/19/17 0922   12/18/17 0615  piperacillin-tazobactam (ZOSYN) IVPB 3.375 g  Status:  Discontinued     3.375 g 12.5 mL/hr over 240 Minutes Intravenous Every 8 hours 12/18/17 0609 12/19/17 0922   12/17/17 1800  vancomycin (VANCOCIN) IVPB 1000 mg/200 mL premix     1,000 mg 200 mL/hr over 60 Minutes Intravenous  Once 12/17/17 1753 12/17/17 1941   12/17/17 1800  piperacillin-tazobactam (ZOSYN) IVPB 3.375 g     3.375 g 100 mL/hr over 30 Minutes Intravenous  Once 12/17/17 1753 12/17/17 1835        Objective:   Vitals:   12/20/17 0952 12/20/17 1000 12/20/17 1100 12/20/17 1200  BP: (!) 141/79 (!) 154/64 (!) 132/43 (!) 144/62  Pulse: 62 60 67 72  Resp:  19 19 19   Temp:      TempSrc:      SpO2:  97% 98% 96%  Weight:      Height:        Wt Readings from Last 3 Encounters:  12/20/17 78.9 kg (173 lb 15.1 oz)  12/10/17 81.3 kg (179 lb  3.7 oz)  10/18/17 79.8 kg (176 lb)    No intake or output data in the 24 hours ending 12/20/17 1334   Physical Exam  Gen: not in distress HEENT: no pallor, moist mucosa, supple neck Chest: Few scattered rhonchi CVS: N S1&S2, no murmurs GI: soft, NT, ND, BS+ Musculoskeletal: warm, trace edema     Data Review:    CBC Recent Labs  Lab 12/17/17 1739 12/17/17 1750 12/18/17 1000 12/19/17 0421 12/20/17 0450  WBC 11.7*  --  6.2 9.1 9.0  HGB 10.3* 12.2 10.5* 9.9* 9.9*  HCT 34.3* 36.0 35.2* 33.4* 33.2*  PLT 256  --  219 264 280  MCV 80.9  --  81.1 81.3 82.0  MCH 24.3*  --  24.2* 24.1* 24.4*  MCHC 30.0  --  29.8* 29.6* 29.8*  RDW 18.1*  --  18.1* 18.3* 18.6*  LYMPHSABS 1.0  --  0.3* 0.8 0.9  MONOABS 1.3*  --  0.1 0.3 0.3  EOSABS 0.1  --  0.0 0.0 0.0  BASOSABS 0.0  --  0.0 0.0 0.0    Chemistries  Recent Labs  Lab 12/17/17 1739 12/17/17 1750 12/18/17 0924 12/19/17 0421 12/20/17 0450  NA 139 141 140 143 143  K 3.1* 3.2* 4.4 3.8 5.0  CL 97* 95* 99* 98* 99*  CO2 30  --  26 30 32  GLUCOSE 160* 153* 255* 156* 171*  BUN 27* 22* 30* 35* 36*  CREATININE 1.65* 1.60* 1.85* 2.17* 1.94*  CALCIUM 7.6*  --  7.6* 7.7* 8.1*  MG 1.1*  --   --  2.2  --   [name] 17  --   --   --   --  ALT 29  --   --   --   --   ALKPHOS 66  --   --   --   --   BILITOT 0.5  --   --   --   --    ------------------------------------------------------------------------------------------------------------------ No results for input(s): CHOL, HDL, LDLCALC, TRIG, CHOLHDL, LDLDIRECT in the last 72 hours.  Lab Results  Component Value Date   HGBA1C 4.7 (L) 12/08/2017   ------------------------------------------------------------------------------------------------------------------ No results for input(s): TSH, T4TOTAL, T3FREE, THYROIDAB in the last 72 hours.  Invalid input(s):  FREET3 ------------------------------------------------------------------------------------------------------------------ No results for input(s): VITAMINB12, FOLATE, FERRITIN, TIBC, IRON, RETICCTPCT in the last 72 hours.  Coagulation profile No results for input(s): INR, PROTIME in the last 168 hours.  No results for input(s): DDIMER in the last 72 hours.  Cardiac Enzymes Recent Labs  Lab 12/17/17 2123 12/18/17 0128 12/18/17 0924  TROPONINI 0.53* 0.40* 0.17*   ------------------------------------------------------------------------------------------------------------------    Component Value Date/Time   BNP 583.0 (H) 12/17/2017 1738    Inpatient Medications  Scheduled Meds: . amLODipine  5 mg Oral Daily  . aspirin EC  162 mg Oral Daily  . atorvastatin  80 mg Oral Daily  . azithromycin  250 mg Oral Daily  . buPROPion  150 mg Oral BID  . clopidogrel  75 mg Oral Daily  . dextromethorphan-guaiFENesin  1 tablet Oral BID  . enoxaparin (LOVENOX) injection  30 mg Subcutaneous Q24H  . famotidine  20 mg Oral Daily  . ipratropium-albuterol  3 mL Nebulization TID  . linaclotide  145 mcg Oral Daily  . linagliptin  5 mg Oral Daily  . metoprolol tartrate  12.5 mg Oral BID  . omega-3 acid ethyl esters  2 g Oral BID  . pantoprazole  40 mg Oral Daily  . predniSONE  40 mg Oral Q supper   Continuous Infusions: PRN Meds:.acetaminophen **OR** acetaminophen, albuterol, ondansetron **OR** ondansetron (ZOFRAN) IV  Micro Results Recent Results (from the past 240 hour(s))  Blood Culture (routine x 2)     Status: None (Preliminary result)   Collection Time: 12/17/17  5:56 PM  Result Value Ref Range Status   Specimen Description RIGHT ANTECUBITAL  Final   Special Requests   Final    BOTTLES DRAWN AEROBIC AND ANAEROBIC Blood Culture adequate volume   Culture   Final    NO GROWTH 3 DAYS Performed at [facility], [address], [place], [place] [address]    Report Status PENDING   Incomplete  Blood Culture (routine x 2)     Status: None (Preliminary result)   Collection Time: 12/17/17  6:00 PM  Result Value Ref Range Status   Specimen Description RIGHT ANTECUBITAL  Final   Special Requests   Final    BOTTLES DRAWN AEROBIC AND ANAEROBIC Blood Culture adequate volume   Culture   Final    NO GROWTH 3 DAYS Performed at [facility], [address] [place], [place] [address]    Report Status PENDING  Incomplete  Respiratory Panel by PCR     Status: Abnormal   Collection Time: 12/18/17  9:28 AM  Result Value Ref Range Status   Adenovirus NOT DETECTED NOT DETECTED Final   Coronavirus 229E NOT DETECTED NOT DETECTED Final   Coronavirus HKU1 NOT DETECTED NOT DETECTED Final   Coronavirus NL63 NOT DETECTED NOT DETECTED Final   Coronavirus OC43 NOT DETECTED NOT DETECTED Final   Metapneumovirus NOT DETECTED NOT DETECTED Final   Rhinovirus / Enterovirus NOT DETECTED NOT DETECTED Final  Influenza A NOT DETECTED NOT DETECTED Final   Influenza B NOT DETECTED NOT DETECTED Final   Parainfluenza Virus 1 NOT DETECTED NOT DETECTED Final   Parainfluenza Virus 2 NOT DETECTED NOT DETECTED Final   Parainfluenza Virus 3 DETECTED (A) NOT DETECTED Final   Parainfluenza Virus 4 NOT DETECTED NOT DETECTED Final   Respiratory Syncytial Virus NOT DETECTED NOT DETECTED Final   Bordetella pertussis NOT DETECTED NOT DETECTED Final   Chlamydophila pneumoniae NOT DETECTED NOT DETECTED Final   Mycoplasma pneumoniae NOT DETECTED NOT DETECTED Final    Comment: Performed at [facility], [place] [address] [place], [place] [address]    Radiology Reports Ct Chest High Resolution  Result Date: 12/20/2017 CLINICAL DATA:  67 year old female with history of abnormal chest x-ray. Evaluate for potential interstitial lung disease. EXAM: CT CHEST WITHOUT CONTRAST TECHNIQUE: Multidetector CT imaging of the chest was performed following the standard protocol without intravenous contrast. High resolution  imaging of the lungs, as well as inspiratory and expiratory imaging, was performed. COMPARISON:  No priors. FINDINGS: Cardiovascular: Heart size is mildly enlarged. There is no significant pericardial fluid, thickening or pericardial calcification. There is aortic atherosclerosis, as well as atherosclerosis of the great vessels of the mediastinum and the coronary arteries, including calcified atherosclerotic plaque in the left main, left anterior descending, left circumflex and right coronary arteries. Mediastinum/Nodes: Several borderline enlarged and mildly enlarged mediastinal and bilateral hilar lymph nodes are noted, largest of which is in the AP window nodal station measuring 18 mm in short axis. Esophagus is unremarkable in appearance. No axillary lymphadenopathy. Lungs/Pleura: Calcified pleural plaques are noted in the thorax bilaterally, indicative of asbestos related pleural disease. Diffuse bronchial wall thickening with mild centrilobular and paraseptal emphysema. High-resolution images demonstrate patchy multifocal areas of mild ground-glass attenuation, extensive septal thickening, thickening of the peribronchovascular interstitium, scattered areas of mild cylindrical bronchiectasis and peripheral bronchiolectasis, as well as areas of parent honeycombing. These findings have no well-defined craniocaudal gradient, and the greatest degree of honeycombing is in the upper lungs. Inspiratory and expiratory imaging is considered nondiagnostic as both phases appear to be inspiratory. No acute consolidative airspace disease. No pleural effusions. Rounded pleural-based density with pleural tails in the posterior aspect of the right lower lobe abutting pleural plaques, most compatible with an area of rounded atelectasis (axial image 87 of series 7). Upper Abdomen: Large proximal gastric diverticulum incidentally noted. Aortic atherosclerosis. Severe atrophy of the right kidney. Musculoskeletal: There are no  aggressive appearing lytic or blastic lesions noted in the visualized portions of the skeleton. IMPRESSION: 1. Very unusual appearance of the lungs which is compatible with interstitial lung disease. 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 Accordingly, some of the interstitial lung changes could represent a manifestation of asbestosis. In particular, with the appearance in the lower lungs is favored to reflect nonspecific interstitial pneumonia (NSIP). However, the unusual appearance in the upper lungs which includes honeycombing could suggest a concurrent process such as chronic hypersensitivity pneumonitis. Outpatient referral to pulmonology for further evaluation is recommended in the near future. Additionally, repeat high-resolution chest CT is suggested in 12 months to assess for temporal changes in the appearance of the lung parenchyma. 2. There is also mild diffuse bronchial wall thickening with mild centrilobular and paraseptal emphysema; imaging findings suggestive of underlying COPD. 3. Aortic atherosclerosis, in addition to left main and 3 vessel coronary artery disease. Please note that although the presence of coronary artery calcium  documents the presence of coronary artery disease, the severity of this disease and any potential stenosis cannot be assessed on this non-gated CT examination. Assessment for potential risk factor modification, dietary therapy or pharmacologic therapy may be warranted, if clinically indicated. 4. Mild cardiomegaly. 5. Severe atrophy of the right kidney. 6. Large gastric diverticulum. Aortic Atherosclerosis (ICD10-I70.0) and Emphysema (ICD10-J43.9). Electronically Signed   By: Vinnie Langton M.D.   On: 12/20/2017 08:45   Nm Pulmonary Vent And Perf (v/q Scan)  Result Date: 12/07/2017 CLINICAL DATA:  Initial evaluation for 1 day history of shortness of breath. History of CHF. EXAM: NUCLEAR MEDICINE  VENTILATION - PERFUSION LUNG SCAN TECHNIQUE: Ventilation images were obtained in multiple projections using inhaled aerosol Tc-1m DTPA. Perfusion images were obtained in multiple projections after intravenous injection of Tc-89m-MAA. RADIOPHARMACEUTICALS:  33 mCi of Tc-65m DTPA aerosol inhalation and 4.4 mCi Tc19m-MAA IV COMPARISON:  Prior chest radiograph from earlier the same day. FINDINGS: Ventilation: No focal ventilation defect. Perfusion: No wedge shaped mismatched peripheral perfusion defects to suggest acute pulmonary embolism. IMPRESSION: Negative VQ scan. No imaging findings to suggest acute pulmonary embolism identified. Electronically Signed   By: Jeannine Boga M.D.   On: 12/07/2017 17:01   Dg Chest Portable 1 View  Result Date: 12/17/2017 CLINICAL DATA:  Shortness of breath, recent pneumonia EXAM: PORTABLE CHEST 1 VIEW COMPARISON:  12/07/2017 FINDINGS: Cardiomegaly with vascular congestion. Diffuse interstitial prominence throughout the lungs compatible with interstitial edema, similar prior study. Possible small effusions. No acute bony abnormality. IMPRESSION: Mild pulmonary edema. Possible small effusions. No real change since prior study. Electronically Signed   By: Rolm Baptise M.D.   On: 12/17/2017 18:11   Dg Chest Portable 1 View  Result Date: 12/07/2017 CLINICAL DATA:  Increased shortness of breath with productive cough. EXAM: PORTABLE CHEST 1 VIEW COMPARISON:  Chest x-ray dated September 19, 2017. FINDINGS: Stable cardiomegaly. Diffusely increased interstitial markings. Hazy opacities at both lung bases. Small bilateral pleural effusions. No consolidation or pneumothorax. No acute osseous abnormality. IMPRESSION: Diffuse interstitial and lower lobe predominant alveolar pulmonary edema with small bilateral pleural effusions. Electronically Signed   By: Titus Dubin M.D.   On: 12/07/2017 12:17    Time Spent in minutes  25   Kattaleya Alia M.D on 12/20/2017 at 1:34  PM  Between 7am to 7pm - Pager - [phone]  After 7pm go to [email or web address] - password [facility]  [facility] -  Office  [phone]

## 2017-12-21 DIAGNOSIS — E875 Hyperkalemia: Secondary | ICD-10-CM | POA: Diagnosis not present

## 2017-12-21 DIAGNOSIS — J9621 Acute and chronic respiratory failure with hypoxia: Secondary | ICD-10-CM

## 2017-12-21 LAB — BASIC METABOLIC PANEL
Anion gap: 11 (ref 5–15)
BUN: 32 mg/dL — AB (ref 6–20)
CO2: 33 mmol/L — ABNORMAL HIGH (ref 22–32)
CREATININE: 1.57 mg/dL — AB (ref 0.44–1.00)
Calcium: 8.6 mg/dL — ABNORMAL LOW (ref 8.9–10.3)
Chloride: 99 mmol/L — ABNORMAL LOW (ref 101–111)
GFR calc Af Amer: 38 mL/min — ABNORMAL LOW (ref >60)
GFR, EST NON AFRICAN AMERICAN: 33 mL/min — AB (ref >60)
GLUCOSE: 141 mg/dL — AB (ref 65–99)
POTASSIUM: 5.2 mmol/L — AB (ref 3.5–5.1)
Sodium: 143 mmol/L (ref 135–145)

## 2017-12-21 LAB — CBC WITH DIFFERENTIAL/PLATELET
BASOS PCT: 0 %
Basophils Absolute: 0 10*3/uL (ref 0.0–0.1)
EOS PCT: 0 %
Eosinophils Absolute: 0 10*3/uL (ref 0.0–0.7)
HCT: 31.9 % — ABNORMAL LOW (ref 36.0–46.0)
Hemoglobin: 9.4 g/dL — ABNORMAL LOW (ref 12.0–15.0)
LYMPHS ABS: 1.2 10*3/uL (ref 0.7–4.0)
Lymphocytes Relative: 18 %
MCH: 24.4 pg — AB (ref 26.0–34.0)
MCHC: 29.5 g/dL — AB (ref 30.0–36.0)
MCV: 82.6 fL (ref 78.0–100.0)
MONO ABS: 0.5 10*3/uL (ref 0.1–1.0)
Monocytes Relative: 7 %
NEUTROS ABS: 4.8 10*3/uL (ref 1.7–7.7)
Neutrophils Relative %: 75 %
PLATELETS: 282 10*3/uL (ref 150–400)
RBC: 3.86 MIL/uL — ABNORMAL LOW (ref 3.87–5.11)
RDW: 18.3 % — AB (ref 11.5–15.5)
WBC: 6.5 10*3/uL (ref 4.0–10.5)

## 2017-12-21 MED ORDER — SODIUM POLYSTYRENE SULFONATE 15 GM/60ML PO SUSP
15.0000 g | Freq: Once | ORAL | Status: AC
  Administered 2017-12-21: 15 g via ORAL
  Filled 2017-12-21: qty 60

## 2017-12-21 MED ORDER — AZITHROMYCIN 250 MG PO TABS: 250.0000 mg | ORAL_TABLET | Freq: Every day | ORAL | 0 refills | Status: AC

## 2017-12-21 MED ORDER — PREDNISONE 20 MG PO TABS: 20.0000 mg | ORAL_TABLET | Freq: Every day | ORAL | 0 refills | Status: AC

## 2017-12-21 MED ORDER — FUROSEMIDE 40 MG PO TABS: 40.0000 mg | ORAL_TABLET | Freq: Every day | ORAL | 1 refills | Status: DC

## 2017-12-21 MED ORDER — AMLODIPINE BESYLATE 5 MG PO TABS: 10.0000 mg | ORAL_TABLET | Freq: Every day | ORAL | 1 refills | Status: DC

## 2017-12-21 MED ORDER — AMLODIPINE BESYLATE 5 MG PO TABS
10.0000 mg | ORAL_TABLET | Freq: Every day | ORAL | Status: DC
  Administered 2017-12-21: 10 mg via ORAL
  Filled 2017-12-21: qty 2

## 2017-12-21 NOTE — Discharge Summary (Signed)
Physician Discharge Summary  Erica [name] INO:[id number] DOB: 24-Jun-1951 DOA: 12/17/2017  PCP: Iona Beard, MD  Admit date: 12/17/2017 Discharge date: 12/21/2017  Admitted From: Home Disposition: Home  Recommendations for Outpatient Follow-up:  1. Follow up with PCP in 1-2 weeks 2. Follow-up with cardiology (has appointment) 3. Patient is being discharged on oral prednisone taper over the next 12 days and complete antibiotic course (Zithromax) on 4/1. 4. Please check renal function during outpatient follow-up.   Home Health: None Equipment/Devices: Chronic home oxygen (2 L via nasal cannula)  Discharge Condition: Fair CODE STATUS: Full code Diet recommendation: Heart Healthy / Carb Modified    Discharge Diagnoses:  Principal Problem:   COPD with acute exacerbation ([facility])   Active Problems:   Acute renal failure superimposed on stage 3 chronic kidney disease (HCC)   Acute on chronic respiratory failure with hypoxia (HCC) Systemic inflammatory response syndrome (SIRS)   Benign essential HTN   GERD (gastroesophageal reflux disease)   Tobacco abuse   Type 2 diabetes mellitus with hyperglycemia (HCC)   Elevated troponin   Hypomagnesemia   Hyperlipidemia   Hyperkalemia  Brief narrative/HPI Please refer to admission H&P for details, in brief, 67 year old female with chronic diastolic CHF, chronic back pain, chronic respiratory failure secondary to COPD on home O2, type 2 diabetes mellitus, hypertension and history of CVA was found by EMS to be hypoxic at 70% on 3 L nasal cannula and placed on nonrebreather and brought to the ED where she was found to be febrile at 102 F, tachycardic and tachypneic.  She was placed on IV Solu-Medrol, empiric antibiotics and bronchodilators with suspicion for COPD exacerbation with SIRS.   Hospital course  Principal problem Acute on chronic respiratory failure with hypoxia Systemic inflammatory response syndrome Secondary to COPD  exacerbation.  Improving with steroids which is being tapered, continue nebs, antibiotic narrowed to Zithromax.  Blood cultures have been negative. Symptoms improving and patient will be discharged on oral prednisone taper over the next 12 days along with completed course of Zithromax on 4/1.  Continue home inhaler and nebs. Counseled strongly on smoking cessation.  Reports she has been quitting for past 3 weeks.  She should follow-up with her PCP and cardiology as outpatient.  Elevated troponin Suspect demand ischemia.  2D echo with normal EF and no wall motion abnormality.  EKG unremarkable.    Cardiology consult appreciated who recommends outpatient stress test. Continue aspirin, Plavix, metoprolol and statin.  Acute on chronic kidney disease (stage II-stage III) Baseline creatinine around 1.6.  Possibly due to diuresis.    Lasix being held and will be resumed on 3/31.  Creatinine improved to baseline today.  Type 2 diabetes mellitus, controlled   Continue linagliptin.  A1c of 4.7.  Hypomagnesemia Replenished.  Hyperkalemia Given Kayexalate.  Follow as outpatient.   Family Communication  : None at bedside  Disposition Plan  : Home   Consults  : Cardiology  Procedures  : 2D echo, HRCT     Discharge Instructions   Allergies as of 12/21/2017   No Known Allergies     Medication List    TAKE these medications   amLODipine 5 MG tablet Commonly known as:  NORVASC Take 2 tablets (10 mg total) by mouth daily. What changed:  how much to take   aspirin 81 MG tablet Take 2 tablets by mouth daily.   atorvastatin 80 MG tablet Commonly known as:  LIPITOR Take 80 mg by mouth daily.   azithromycin 250 MG  tablet Commonly known as:  ZITHROMAX Take 1 tablet (250 mg total) by mouth daily for 2 days. Start taking on:  12/22/2017   budesonide-formoterol 160-4.5 MCG/ACT inhaler Commonly known as:  SYMBICORT Inhale 2 puffs into the lungs 2 (two) times daily.    buPROPion 150 MG 12 hr tablet Commonly known as:  WELLBUTRIN SR Take 150 mg by mouth 2 (two) times daily.   clopidogrel 75 MG tablet Commonly known as:  PLAVIX Take 75 mg by mouth daily.   famotidine 20 MG tablet Commonly known as:  PEPCID Take 20 mg by mouth 2 (two) times daily.   furosemide 40 MG tablet Commonly known as:  LASIX Take 1 tablet (40 mg total) by mouth daily. Start taking on:  12/23/2017 What changed:  These instructions start on 12/23/2017. If you are unsure what to do until then, ask your doctor or other care provider.   ipratropium-albuterol 0.5-2.5 (3) MG/3ML Soln Commonly known as:  DUONEB Take 3 mLs by nebulization every 6 (six) hours as needed (for shortness of breath).   LINZESS 145 MCG Caps capsule Generic drug:  linaclotide Take 145 mcg by mouth daily.   metoprolol tartrate 25 MG tablet Commonly known as:  LOPRESSOR Take 0.5 tablets (12.5 mg total) by mouth 2 (two) times daily.   omega-3 acid ethyl esters 1 g capsule Commonly known as:  LOVAZA Take 2 g by mouth 2 (two) times daily.   omeprazole 20 MG capsule Commonly known as:  PRILOSEC Take 20 mg by mouth daily.   ONGLYZA 5 MG Tabs tablet Generic drug:  saxagliptin HCl Take 5 mg by mouth daily. Reported on 09/22/2015   OXYGEN Inhale 2 L into the lungs continuous.   predniSONE 20 MG tablet Commonly known as:  DELTASONE Take 1 tablet (20 mg total) by mouth daily with breakfast for 12 days. What changed:    medication strength  how much to take  additional instructions   SPIRIVA HANDIHALER 18 MCG inhalation capsule Generic drug:  tiotropium Take 1 puff by mouth daily.      Follow-up Information    Erma Heritage, PA-C Follow up on 01/04/2018.   Specialties:  Physician Assistant, Cardiology Why:  Cardiology Hospital Follow-Up on 01/04/2018 at 1:00PM.  Contact information: [place] [address] [phone]        Iona Beard, MD. Schedule an appointment as  soon as possible for a visit in 1 week(s).   Specialty:  Family Medicine Contact information: [address] [place] [address] [phone]        Arnoldo Lenis, MD .   Specialty:  Cardiology Contact information: [address] [place] [address] [phone]          No Known Allergies    Procedures/Studies: Ct Chest High Resolution  Result Date: 12/20/2017 CLINICAL DATA:  67 year old female with history of abnormal chest x-ray. Evaluate for potential interstitial lung disease. EXAM: CT CHEST WITHOUT CONTRAST TECHNIQUE: Multidetector CT imaging of the chest was performed following the standard protocol without intravenous contrast. High resolution imaging of the lungs, as well as inspiratory and expiratory imaging, was performed. COMPARISON:  No priors. FINDINGS: Cardiovascular: Heart size is mildly enlarged. There is no significant pericardial fluid, thickening or pericardial calcification. There is aortic atherosclerosis, as well as atherosclerosis of the great vessels of the mediastinum and the coronary arteries, including calcified atherosclerotic plaque in the left main, left anterior descending, left circumflex and right coronary arteries. Mediastinum/Nodes:  Several borderline enlarged and mildly enlarged mediastinal and bilateral hilar lymph nodes are noted, largest of which is in the AP window nodal station measuring 18 mm in short axis. Esophagus is unremarkable in appearance. No axillary lymphadenopathy. Lungs/Pleura: Calcified pleural plaques are noted in the thorax bilaterally, indicative of asbestos related pleural disease. Diffuse bronchial wall thickening with mild centrilobular and paraseptal emphysema. High-resolution images demonstrate patchy multifocal areas of mild ground-glass attenuation, extensive septal thickening, thickening of the peribronchovascular interstitium, scattered areas of mild cylindrical bronchiectasis and peripheral  bronchiolectasis, as well as areas of parent honeycombing. These findings have no well-defined craniocaudal gradient, and the greatest degree of honeycombing is in the upper lungs. Inspiratory and expiratory imaging is considered nondiagnostic as both phases appear to be inspiratory. No acute consolidative airspace disease. No pleural effusions. Rounded pleural-based density with pleural tails in the posterior aspect of the right lower lobe abutting pleural plaques, most compatible with an area of rounded atelectasis (axial image 87 of series 7). Upper Abdomen: Large proximal gastric diverticulum incidentally noted. Aortic atherosclerosis. Severe atrophy of the right kidney. Musculoskeletal: There are no aggressive appearing lytic or blastic lesions noted in the visualized portions of the skeleton. IMPRESSION: 1. Very unusual appearance of the lungs which is compatible with interstitial lung disease. 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 Accordingly, some of the interstitial lung changes could represent a manifestation of asbestosis. In particular, with the appearance in the lower lungs is favored to reflect nonspecific interstitial pneumonia (NSIP). However, the unusual appearance in the upper lungs which includes honeycombing could suggest a concurrent process such as chronic hypersensitivity pneumonitis. Outpatient referral to pulmonology for further evaluation is recommended in the near future. Additionally, repeat high-resolution chest CT is suggested in 12 months to assess for temporal changes in the appearance of the lung parenchyma. 2. There is also mild diffuse bronchial wall thickening with mild centrilobular and paraseptal emphysema; imaging findings suggestive of underlying COPD. 3. Aortic atherosclerosis, in addition to left main and 3 vessel coronary artery disease. Please note that although the presence of coronary artery calcium  documents the presence of coronary artery disease, the severity of this disease and any potential stenosis cannot be assessed on this non-gated CT examination. Assessment for potential risk factor modification, dietary therapy or pharmacologic therapy may be warranted, if clinically indicated. 4. Mild cardiomegaly. 5. Severe atrophy of the right kidney. 6. Large gastric diverticulum. Aortic Atherosclerosis (ICD10-I70.0) and Emphysema (ICD10-J43.9). Electronically Signed   By: Vinnie Langton M.D.   On: 12/20/2017 08:45   Nm Pulmonary Vent And Perf (v/q Scan)  Result Date: 12/07/2017 CLINICAL DATA:  Initial evaluation for 1 day history of shortness of breath. History of CHF. EXAM: NUCLEAR MEDICINE VENTILATION - PERFUSION LUNG SCAN TECHNIQUE: Ventilation images were obtained in multiple projections using inhaled aerosol Tc-4m DTPA. Perfusion images were obtained in multiple projections after intravenous injection of Tc-68m-MAA. RADIOPHARMACEUTICALS:  33 mCi of Tc-61m DTPA aerosol inhalation and 4.4 mCi Tc78m-MAA IV COMPARISON:  Prior chest radiograph from earlier the same day. FINDINGS: Ventilation: No focal ventilation defect. Perfusion: No wedge shaped mismatched peripheral perfusion defects to suggest acute pulmonary embolism. IMPRESSION: Negative VQ scan. No imaging findings to suggest acute pulmonary embolism identified. Electronically Signed   By: Jeannine Boga M.D.   On: 12/07/2017 17:01   Dg Chest Portable 1 View  Result Date: 12/17/2017 CLINICAL DATA:  Shortness of breath, recent pneumonia EXAM: PORTABLE CHEST 1 VIEW COMPARISON:  12/07/2017  FINDINGS: Cardiomegaly with vascular congestion. Diffuse interstitial prominence throughout the lungs compatible with interstitial edema, similar prior study. Possible small effusions. No acute bony abnormality. IMPRESSION: Mild pulmonary edema. Possible small effusions. No real change since prior study. Electronically Signed   By: Rolm Baptise M.D.   On:  12/17/2017 18:11   Dg Chest Portable 1 View  Result Date: 12/07/2017 CLINICAL DATA:  Increased shortness of breath with productive cough. EXAM: PORTABLE CHEST 1 VIEW COMPARISON:  Chest x-ray dated September 19, 2017. FINDINGS: Stable cardiomegaly. Diffusely increased interstitial markings. Hazy opacities at both lung bases. Small bilateral pleural effusions. No consolidation or pneumothorax. No acute osseous abnormality. IMPRESSION: Diffuse interstitial and lower lobe predominant alveolar pulmonary edema with small bilateral pleural effusions. Electronically Signed   By: Titus Dubin M.D.   On: 12/07/2017 12:17    2D echo Study Conclusions  - Limited echo to evaluate LV function. - Left ventricle: The cavity size was normal. Wall thickness was   normal. Systolic function was normal. The estimated ejection   fraction was in the range of 60% to 65%. Wall motion was normal;   there were no regional wall motion abnormalities. - Aortic valve: Valve area (VTI): 2.17 cm^2. Valve area (Vmax):   2.14 cm^2. - Mitral valve: There was mild regurgitation.   Subjective: Feels better and at baseline.  No overnight events.  Discharge Exam: Vitals:   12/21/17 0910 12/21/17 0947  BP:  (!) 159/54  Pulse:  75  Resp:    Temp:    SpO2: 100%    Vitals:   12/21/17 0700 12/21/17 0900 12/21/17 0910 12/21/17 0947  BP: (!) 154/57   (!) 159/54  Pulse: 65 (!) 56  75  Resp: 20 (!) 22    Temp:      TempSrc:      SpO2: 98% 100% 100%   Weight:      Height:        General: Erica [name] is alert, awake, not in acute distress Cardiovascular: RRR, S1/S2 +, no rubs, no gallops Respiratory: CTA bilaterally, no wheezing, no rhonchi Abdominal: Soft, NT, ND, bowel sounds + Extremities: no edema, no cyanosis    The results of significant diagnostics from this hospitalization (including imaging, microbiology, ancillary and laboratory) are listed below for reference.     Microbiology: Recent Results (from the  past 240 hour(s))  Blood Culture (routine x 2)     Status: None (Preliminary result)   Collection Time: 12/17/17  5:56 PM  Result Value Ref Range Status   Specimen Description RIGHT ANTECUBITAL  Final   Special Requests   Final    BOTTLES DRAWN AEROBIC AND ANAEROBIC Blood Culture adequate volume   Culture   Final    NO GROWTH 4 DAYS Performed at [facility], [address], [place], [place] [address]    Report Status PENDING  Incomplete  Blood Culture (routine x 2)     Status: None (Preliminary result)   Collection Time: 12/17/17  6:00 PM  Result Value Ref Range Status   Specimen Description RIGHT ANTECUBITAL  Final   Special Requests   Final    BOTTLES DRAWN AEROBIC AND ANAEROBIC Blood Culture adequate volume   Culture   Final    NO GROWTH 4 DAYS Performed at [facility], [address], [place], [place] [address]    Report Status PENDING  Incomplete  Respiratory Panel by PCR     Status: Abnormal   Collection Time: 12/18/17  9:28 AM  Result  Value Ref Range Status   Adenovirus NOT DETECTED NOT DETECTED Final   Coronavirus 229E NOT DETECTED NOT DETECTED Final   Coronavirus HKU1 NOT DETECTED NOT DETECTED Final   Coronavirus NL63 NOT DETECTED NOT DETECTED Final   Coronavirus OC43 NOT DETECTED NOT DETECTED Final   Metapneumovirus NOT DETECTED NOT DETECTED Final   Rhinovirus / Enterovirus NOT DETECTED NOT DETECTED Final   Influenza A NOT DETECTED NOT DETECTED Final   Influenza B NOT DETECTED NOT DETECTED Final   Parainfluenza Virus 1 NOT DETECTED NOT DETECTED Final   Parainfluenza Virus 2 NOT DETECTED NOT DETECTED Final   Parainfluenza Virus 3 DETECTED (A) NOT DETECTED Final   Parainfluenza Virus 4 NOT DETECTED NOT DETECTED Final   Respiratory Syncytial Virus NOT DETECTED NOT DETECTED Final   Bordetella pertussis NOT DETECTED NOT DETECTED Final   Chlamydophila pneumoniae NOT DETECTED NOT DETECTED Final   Mycoplasma pneumoniae NOT DETECTED NOT DETECTED Final    Comment: Performed  at [facility], [place] [address] [place], [place] [address]     Labs: BNP (last 3 results) Recent Labs    09/19/17 1348 12/07/17 1218 12/17/17 1738  BNP 244.0* 155.0* 803.2*   Basic Metabolic Panel: Recent Labs  Lab 12/17/17 1739 12/17/17 1750 12/18/17 0128 12/18/17 0924 12/19/17 0421 12/20/17 0450 12/21/17 0604  NA 139 141  --  140 143 143 143  K 3.1* 3.2*  --  4.4 3.8 5.0 5.2*  CL 97* 95*  --  99* 98* 99* 99*  CO2 30  --   --  26 30 32 33*  GLUCOSE 160* 153*  --  255* 156* 171* 141*  BUN 27* 22*  --  30* 35* 36* 32*  CREATININE 1.65* 1.60*  --  1.85* 2.17* 1.94* 1.57*  CALCIUM 7.6*  --   --  7.6* 7.7* 8.1* 8.6*  MG 1.1*  --   --   --  2.2  --   --   PHOS  --   --  5.6*  --   --   --   --    Liver Function Tests: Recent Labs  Lab 12/17/17 1739  AST 17  ALT 29  ALKPHOS 66  BILITOT 0.5  PROT 6.7  ALBUMIN 3.5   No results for input(s): LIPASE, AMYLASE in the last 168 hours. No results for input(s): AMMONIA in the last 168 hours. CBC: Recent Labs  Lab 12/17/17 1739 12/17/17 1750 12/18/17 1000 12/19/17 0421 12/20/17 0450 12/21/17 0604  WBC 11.7*  --  6.2 9.1 9.0 6.5  NEUTROABS 9.3*  --  5.8 8.0* 7.8 4.8  HGB 10.3* 12.2 10.5* 9.9* 9.9* 9.4*  HCT 34.3* 36.0 35.2* 33.4* 33.2* 31.9*  MCV 80.9  --  81.1 81.3 82.0 82.6  PLT 256  --  219 264 280 282   Cardiac Enzymes: Recent Labs  Lab 12/17/17 2123 12/18/17 0128 12/18/17 0924  TROPONINI 0.53* 0.40* 0.17*   BNP: Invalid input(s): POCBNP CBG: No results for input(s): GLUCAP in the last 168 hours. D-Dimer No results for input(s): DDIMER in the last 72 hours. Hgb A1c No results for input(s): HGBA1C in the last 72 hours. Lipid Profile No results for input(s): CHOL, HDL, LDLCALC, TRIG, CHOLHDL, LDLDIRECT in the last 72 hours. Thyroid function studies No results for input(s): TSH, T4TOTAL, T3FREE, THYROIDAB in the last 72 hours.  Invalid input(s): FREET3 Anemia work up No results for input(s):  VITAMINB12, FOLATE, FERRITIN, TIBC, IRON, RETICCTPCT in the last 72 hours. Urinalysis  Component Value Date/Time   COLORURINE YELLOW 03/14/2013 1847   APPEARANCEUR CLEAR 03/14/2013 1847   LABSPEC 1.025 03/14/2013 1847   PHURINE 6.0 03/14/2013 1847   GLUCOSEU NEGATIVE 03/14/2013 1847   HGBUR NEGATIVE 03/14/2013 1847   BILIRUBINUR NEGATIVE 03/14/2013 1847   KETONESUR NEGATIVE 03/14/2013 1847   PROTEINUR NEGATIVE 03/14/2013 1847   UROBILINOGEN 0.2 03/14/2013 1847   NITRITE NEGATIVE 03/14/2013 1847   LEUKOCYTESUR NEGATIVE 03/14/2013 1847   Sepsis Labs Invalid input(s): PROCALCITONIN,  WBC,  LACTICIDVEN Microbiology Recent Results (from the past 240 hour(s))  Blood Culture (routine x 2)     Status: None (Preliminary result)   Collection Time: 12/17/17  5:56 PM  Result Value Ref Range Status   Specimen Description RIGHT ANTECUBITAL  Final   Special Requests   Final    BOTTLES DRAWN AEROBIC AND ANAEROBIC Blood Culture adequate volume   Culture   Final    NO GROWTH 4 DAYS Performed at [facility], [address], [place], [place] [address]    Report Status PENDING  Incomplete  Blood Culture (routine x 2)     Status: None (Preliminary result)   Collection Time: 12/17/17  6:00 PM  Result Value Ref Range Status   Specimen Description RIGHT ANTECUBITAL  Final   Special Requests   Final    BOTTLES DRAWN AEROBIC AND ANAEROBIC Blood Culture adequate volume   Culture   Final    NO GROWTH 4 DAYS Performed at [facility], [address], [place], [place] [address]    Report Status PENDING  Incomplete  Respiratory Panel by PCR     Status: Abnormal   Collection Time: 12/18/17  9:28 AM  Result Value Ref Range Status   Adenovirus NOT DETECTED NOT DETECTED Final   Coronavirus 229E NOT DETECTED NOT DETECTED Final   Coronavirus HKU1 NOT DETECTED NOT DETECTED Final   Coronavirus NL63 NOT DETECTED NOT DETECTED Final   Coronavirus OC43 NOT DETECTED NOT DETECTED Final   Metapneumovirus NOT  DETECTED NOT DETECTED Final   Rhinovirus / Enterovirus NOT DETECTED NOT DETECTED Final   Influenza A NOT DETECTED NOT DETECTED Final   Influenza B NOT DETECTED NOT DETECTED Final   Parainfluenza Virus 1 NOT DETECTED NOT DETECTED Final   Parainfluenza Virus 2 NOT DETECTED NOT DETECTED Final   Parainfluenza Virus 3 DETECTED (A) NOT DETECTED Final   Parainfluenza Virus 4 NOT DETECTED NOT DETECTED Final   Respiratory Syncytial Virus NOT DETECTED NOT DETECTED Final   Bordetella pertussis NOT DETECTED NOT DETECTED Final   Chlamydophila pneumoniae NOT DETECTED NOT DETECTED Final   Mycoplasma pneumoniae NOT DETECTED NOT DETECTED Final    Comment: Performed at [facility], [place] [address] [place], [place] [address]     Time coordinating discharge: Over 30 minutes  SIGNED:   Louellen Molder, MD  [facility] 12/21/2017, 10:06 AM Pager   If 7PM-7AM, please contact night-coverage [email or web address] Password [email or web address]

## 2017-12-21 NOTE — Progress Notes (Signed)
Discharge instruction provided. Pt verbalizes understanding.

## 2017-12-22 LAB — CULTURE, BLOOD (ROUTINE X 2)
Culture: NO GROWTH
Culture: NO GROWTH
Special Requests: ADEQUATE
Special Requests: ADEQUATE

## 2017-12-25 ENCOUNTER — Telehealth: Payer: Self-pay | Admitting: *Deleted

## 2017-12-25 NOTE — Patient Outreach (Addendum)
[facility]) Care Management  12/25/2017  JOLENA KITTLE 12/25/1950 [id number]   Late entry for 12/21/17 1741 Transition of care week 1   [facility] CM called Mrs Tuckett for transition of care services after her d/c home on 12/21/17. She reports she is doing better but Cm noted some congestion sounds.  She confirms she is using her oxygen. She reports she will contact [name] for follow appointment as ordered.  12/25/17  Call attempt to reach Mrs Dittmer for [facility] CHF f/u call +transition of care week 2  Not a successful outreach call - No answer and unable to leave a message for her   Epic listed Upcoming appointments  01/04/18 [facility] 1300 01/14/18 GI [name] 0930 establish care anemia   Plans To be discussed in [facility] multidisciplinary meeting 12/27/17 CM will attempt a second outreach call to Mrs Goehring for  [facility] CHF f/u call +transition of care week Zeb. Lavina Hamman, RN, BSN, [place] Coordinator [phone] week day mobile

## 2017-12-26 ENCOUNTER — Ambulatory Visit: Payer: Self-pay | Admitting: *Deleted

## 2017-12-26 DIAGNOSIS — J449 Chronic obstructive pulmonary disease, unspecified: Secondary | ICD-10-CM | POA: Diagnosis not present

## 2017-12-27 ENCOUNTER — Other Ambulatory Visit: Payer: Self-pay | Admitting: *Deleted

## 2017-12-27 NOTE — Telephone Encounter (Signed)
Never received labs.

## 2017-12-27 NOTE — Patient Outreach (Signed)
[facility]) Care Management  12/27/2017  Erica [name] [birth year] [id number]   [facility] Multidisciplinary team discussion  [facility] CM presented [name] during [facility] Multidisciplinary team meeting  Recommendations/Questions/Concerns- Pulmonologist/Dr [facility] action plan for COPD/CHF zone yellow s/s, [employer] lingu[employer] transportation services, check Paramedics program availability for [place] to follow up with [name] for transition of care services weekly and follow up on recommendations   Kimberly L. Lavina Hamman, RN, BSN, [place] Coordinator [phone] week day mobile

## 2017-12-28 NOTE — Telephone Encounter (Signed)
Requested labs again (ASAP)

## 2018-01-01 ENCOUNTER — Other Ambulatory Visit: Payer: Self-pay | Admitting: *Deleted

## 2018-01-01 NOTE — Patient Outreach (Signed)
[facility]) Care Management  01/01/2018  Erica [name] 18-Jul-1951 [id number]   Transition of care call week 2 /EMMI red flags for not weighing daily  CM attempted to reach Erica [name] for further transition of care services after her re admission on 12/18/17 and to review her EMMI red flags x 2 but with out and automate voice message stating she is not available on her listed home number  A call was attempted on 12/25/17 at 1755 with the same response   CM finally reached Erica [name] on her listed mobile number  CHF/COPD last admission She reports she believes "running down the stairs" caused her to "lose my breath" resulting in her last admission. She denies sob, orthopnea, coughing or chest pains today. She denies smoking and today reports she last smoked about "four weeks ago" Wt was 173 lbs leaving the hospital and now 177 lbs at home per Erica [name]. She admits to not weighing daily as recommended.   CM encouraged her not to smoke and to follow all recommendations/orders from d/c instructions.CM encouraged calls to [facility] CM , 24 hr RN line or primary MD office for abnormal s/s   CM discussed her red flags for not weighing per EMMI on 12/22/17, 12/25/17 and 12/28/17.  She states "I am trying not to eat so much." She complains of her scales not working at intervals "I think I stepped on it too hard" She finally states her scale is presently working appropriately.  CM reminded her to check her weight daily on her home scales in am after voiding and she voiced understanding. CM encouraged her to purchase the scales she reports she has looked at [facility] lately to have at home in case one scale does not work.  CM discussed the importance of comparing these weights to determine if fluids/edema is occurring and the importance of taking in only the recommended fluid amount.  Erica [name] states she was told in the hospital to "drink seven cups"  CM reviewed that she should take 7 cups or less.  CM  encouraged her to take the measuring cup she has at home, to pour 7 cups in an empty pitcher and to mark the pitcher to use daily as a guideline.  She stated she would.  CM discussed how extra fluids may increase her CHF s/s especially sob and edema.  She voiced understanding and was able to repeat back correct information when the teach back method was used.   Medications She confirms she is "peeing more" on lasix She also reports her oxygen was increased from 2 to 3 L [name].  She states she has a pulmonologist and reports she will see this MD on 01/04/18 at 1 pm but her d/c instructions indicates she is seeing cardiology on 4/09/26/17 at 1 pm [place]. Cm discussed the differences in a cardiologist and pulmonologist with her  Discussed CM's contact with Erica [name] at [facility] office related to a pulmonologist and a plan to manage her CHF/COPD yellow zone s/s CM discussed CM inquires about a possible integrated medicine/paramedic program for [employer]. Erica [name] states she would agree to a referral to and be appreciable of services from an  integrated medicine/paramedic program if available.   Today's voiced Problem per Erica [name] is a need for a light weight oxygen tank. She reports "This tank is getting too heavy for me"  She reports getting her oxygen delivered from [employer] [phone]) of [place]  Now 3  L O2 wt 173 now ow 177 "I'm not eating    Plans CM will follow up with Erica [name] in 7-10 days or prn for transition of care, appointment follow up or Follow up form [name] CM called Dr Cathey Endow office and spoke with Erica [name] his RN to check on Erica [name]'s last/upcoming follow up appointment with [name] and to review need of a plan from [name] when Erica [name] is experiencing the yellow CHF Zone as discussed in 12/27/17 [facility] multidisciplinary case discussion. Confirmed Erica [name] came in Dr Cathey Endow office on 12/03/17 and has an upcoming MD visit on 01/08/18 at 1145.  Erica [name] to follow up on  the plan for CHF/COPD care with [name]. Also discussed there is not a pulmonologist with Erica [name] as Erica [name] she only preferred seeing [name] Cm consulted [facility] staff, [place], to see if [place] has an integrated Microbiologist. Jiles Garter to follow up with [facility] CM if there is a program available  [facility] CM Care Plan Problem One     Most Recent Value  Care Plan Problem One  risk for re admission  Role Documenting the Problem One  Care Management [place] for Problem One  Active  THN Long Term Goal   over the next 45 days the patient will remain out of hospital with assist of transition of care managment and education for home care for congestive HF, review Congestive HF action plan  THN Long Term Goal Start Date  12/12/17  Interventions for Problem One Long Term Goal  will restart transtion of care services, continue education on who to call for s/s, monitoring and management of chf/copd at home, re discuss fluid restriction, weighing daily, discussed inquires about possible inttegrated medicine/paramedics program availability, encouraged not to smoke and to follow all recommendations/orders from d/c instructions Encouraged call to [facility] CM , 24 hr RN line or primary MD office for abnormal s/s   THN CM Short Term Goal #1   over the next 7 days patient will be seen by her primary MD for further COPD/CHF zone planning  [facility] CM Short Term Goal #1 Start Date  01/01/18  Interventions for Short Term Goal #1  will restart transtion of care services, continue education on who to call for s/s, monitoring and management of chf/copd at home, re discuss fluid restriction, weighing daily, discussed inquires about possible inttegrated medicine/paramedics program availability, encouraged not to smoke and to follow all recommendations/orders from d/c instructions Encouraged call to [facility] CM , 24 hr RN line or primary MD office for abnormal s/s   (Pended)     [facility] CM  Care Plan Problem Two     Most Recent Value  Care Plan Problem Two  heavy oxygen tank  Role Documenting the Problem Two  Care Management Coordinator  Care Plan for Problem Two  Active  Interventions for Problem Two Long Term Goal   to consult with primary care MD for referral to [employer] for possible smaller oxygen tank or plan to safely have lighter tank or more oxygen to meet iher continuous 3 L [place] daily supply, consult with DME company prn Educated patient on process to initiate possible help with lighter tank or acces to more oygen to get her daily 3 L [place] supply   THN Long Term Goal  over the next 31 days patient will be informed about possible lighter oxygen tank availability   THN Long Term Goal Start Date  01/01/18  THN CM Short Term Goal #1   over the next 7 days patient will verbalize she has a lighter oxygen tank or understand the DME alternative available to her after consutl with DME supplier or MD  [facility] CM Short Term Goal #1 Start Date  01/01/18  Interventions for Short Term Goal #2   to consult with primary care MD for referral to [place] for possible smaller oxygen tank or plan to safely have lighter tank or more oxygen to meet iher continuous 3 L [place] daily supply, consult with DME company prn Educated patient on process to initiate possible help with lighter tank or acces to more oygen to get her daily 3 L [place] supply update patient     [facility] CM Care Plan Problem Three     Most Recent Value  Care Plan Problem Three  not seen by a pulmonologist  Role Documenting the Problem Three  Care Management Coordinator  Care Plan for Problem Three  Active  THN Long Term Goal   over the next 31 days patient will be seen by a pulmonologist or her prirmary MD will provide a plan to manage her yellow zone s/s for COPD/CHf   THN Long Term Goal Start Date  01/01/18  Interventions for Problem Three Long Term Goal  speak with primary MD's RN about no pulmonologist or a plan from primary MD for management of  COPD/CHF yellow zone s/s, discuss this request with patient, discuss differences in CV and pulmonologists and CV MD plus importance of pulmonologist, keep pt updated, encouraged use of recommended oxygen and to report yellow zone chf/copd s/s to MDs immediately   [facility] CM Short Term Goal #1   over the next 7 days patient will report she is scheduled to see a pulmonologist or have a plan from her primary MD to manage her COPD/CHf yellow zone symptoms during transition of care contact  [facility] CM Short Term Goal #1 Start Date  01/01/18  Interventions for Short Term Goal #1  re assess patient for transition of care services, inquire about follow up pulmonology/primary MD appointments, speak with primary MD's RN about no pulmonologist or a plan from primary MD for management of COPD/CHF yellow zone s/s, discuss this request with patient, discuss differences in CV and pulmonologists and CV MD plus importance of pulmonologist, keep pt updated, encouraged use of recommended oxygen and to report yellow zone chf/copd s/s to MDs immediately        [place]. Lavina Hamman, RN, BSN, [place] Coordinator [phone] week day mobile

## 2018-01-04 ENCOUNTER — Other Ambulatory Visit: Payer: Self-pay | Admitting: *Deleted

## 2018-01-04 ENCOUNTER — Ambulatory Visit: Payer: Self-pay | Admitting: Student

## 2018-01-04 NOTE — Patient Outreach (Addendum)
[facility]) Care Management  01/04/2018  CHEVONNE BOSTROM [birth year] [id number]   Transition of care week 3, CHF, cancelled CV office visit/EMMI CHF Not checking weight flag for 01/01/18 & 01/03/18  [facility] CM called Mrs An when CM found out her previously 01/04/18 1300 CV appointment with B Ahmed Prima was cancelled  Mrs Stallone informed Cm she cancelled her appointment on "yesterday" after finding out "my sister had an appointment and I didn't have money for gas" She states she rescheduled for 01/15/18. CM counseled her on the importance of keeping her appointments after being d/c from the hospital in 1-2 weeks post hospitalization.  She has previously cancelled appointments and [facility] has provided her with resources for transportation.  She had confirmed with CM that she would be attending this scheduled CV appointment when CM last spoke with her.  She confirms she has an appointment on 01/08/18 at 1145 with [name] that she informs CM she will be attending   EMMI CHF - CM discussed this week's EMMI CHF red flags for not taking her weight. She again reports issues with her scales "I have to step on it hard. I had to use the one at my sister's house. I ain't got no money to get one until the third" She also has been not checking her weight in the mornings but in the evenings. CM discussed again the importance of checking her weight in the mornings related to her CHF home monitoring and management.  She voiced understanding.  She confirms a weight of 179 lbs today  Present concern voiced was a headache she experienced on 01/03/18 evening after her "friend arrived home wanting me to cook fish cakes" She reports a systolic BP of 371 that was not initially resolved with use of "aleve", "I got upset at my friend" (female). CM discussed relaxation techniques (talking with other family/friends, music, a walk, a drive etc) , taking her BP medications as ordered, monitoring her BP and prn pain  medications. Discussed the importance of keeping BP WNL for cardiac and stroke prevention. She voiced understanding. Encouraged to reach out to [facility] CM, 24 hour RN line or MD office. No return call from Primary MD office about pulmonologist or from integrated paramedics program at this time.  Plans CM to follow up with Mrs Murdock in 1-2 weeks for further transition of care services, to review Mrs  Dullea progress and updates on recommendations in [facility] multidisciplinary meeting on 01/11/18.  CM will attempt to confirm Mrs Daidone primary MD office visit on 01/08/18 attendance  [facility] CM Care Plan Problem One     Most Recent Value  Care Plan Problem One  risk for re admission  Role Documenting the Problem One  Care Management [place] for Problem One  Active  THN Long Term Goal   over the next 45 days the patient will remain out of hospital with assist of transition of care managment and education for home care for congestive HF, review Congestive HF action plan  THN Long Term Goal Start Date  12/12/17  Interventions for Problem One Long Term Goal  Weekly calls for transition of care/EMMI red flags, review of CHF zones, diet, medicinces, contact with paramedics, MD office, continuous education on home management  [facility] CM Short Term Goal #1   over the next 7 days patient will be seen by her primary MD for further COPD/CHF zone planning  [facility] CM Short Term Goal #1 Start Date  01/01/18  Interventions for Short Term Goal #1  counseled her on the importance of keeping her appointments after being d/c from the hospital in 1-2 weeks post hospitalization    [facility] CM Care Plan Problem Two     Most Recent Value  Care Plan Problem Two  heavy oxygen tank  Role Documenting the Problem Two  Care Management [place] for Problem Two  Active  Interventions for Problem Two Long Term Goal   consult DME company and prn MD about order for lighter oxygen tank  THN Long Term Goal  over the next 31 days  patient will be informed about possible lighter oxygen tank availability   THN Long Term Goal Start Date  01/01/18  THN CM Short Term Goal #1   over the next 7 days patient will verbalize she has a lighter oxygen tank or understand the DME alternative available to her after consutl with DME supplier or MD  [facility] CM Short Term Goal #1 Start Date  01/01/18    [facility] CM Care Plan Problem Three     Most Recent Value  Care Plan Problem Three  not seen by a pulmonologist  Role Documenting the Problem Three  Care Management [place] for Problem Three  Active  THN Long Term Goal   over the next 31 days patient will be seen by a pulmonologist or her prirmary MD will provide a plan to manage her yellow zone s/s for COPD/CHf   THN Long Term Goal Start Date  01/01/18  Interventions for Problem Three Long Term Goal  Pending response from MD office about CHF zone yellow s/s plan or pulmonologist referral, follow up after pt upcoming primary MD appointment  [facility] CM Short Term Goal #1   over the next 7 days patient will report she is scheduled to see a pulmonologist or have a plan from her primary MD to manage her COPD/CHf yellow zone symptoms during transition of care contact  [facility] CM Short Term Goal #1 Start Date  01/01/18  Interventions for Short Term Goal #1  Pending response from MD office about CHF zone yellow s/s plan or pulmonologist referral, follow up after pt upcoming primary MD appointment      [place]. Lavina Hamman, RN, BSN, [place] Coordinator [phone] week day mobile

## 2018-01-04 NOTE — Telephone Encounter (Signed)
Never received labs however she has had labs done during recent admission.   Hgb in the 9-10 range which is improved from 09/2017.   Keep upcoming OV as planned.

## 2018-01-07 NOTE — Telephone Encounter (Signed)
Tried calling pt. Not able to leave a voicemail. Will call back.

## 2018-01-08 ENCOUNTER — Other Ambulatory Visit: Payer: Self-pay | Admitting: *Deleted

## 2018-01-08 DIAGNOSIS — Z9981 Dependence on supplemental oxygen: Secondary | ICD-10-CM | POA: Diagnosis not present

## 2018-01-08 DIAGNOSIS — D509 Iron deficiency anemia, unspecified: Secondary | ICD-10-CM | POA: Diagnosis not present

## 2018-01-08 DIAGNOSIS — J449 Chronic obstructive pulmonary disease, unspecified: Secondary | ICD-10-CM | POA: Diagnosis not present

## 2018-01-08 DIAGNOSIS — I10 Essential (primary) hypertension: Secondary | ICD-10-CM | POA: Diagnosis not present

## 2018-01-08 DIAGNOSIS — E1165 Type 2 diabetes mellitus with hyperglycemia: Secondary | ICD-10-CM | POA: Diagnosis not present

## 2018-01-08 NOTE — Progress Notes (Signed)
This encounter was created in error - please disregard.

## 2018-01-08 NOTE — Patient Outreach (Signed)
[facility]) Care Management  01/08/2018  Erica [name] 1950-12-18 [id number]  Care coordination/transition of care week 4/ MD office visit   [name] was not at MD office at 1145 Staff reports she came in to the office and left early  CM contacted her via telephone Oxygen DME needs- Spoke with Pam. Has a POC (Portable concentrator) size of a pocket book 4-5 lbs. Has a oxygo Received POC goes up to 4 L Will last if she keeps charged at home and in car plug in cigarette lighter. There is a second battery that last longer but costs over $500.  Discussed this with [name] and encouraged her to wear her oxygen strap across diagonally vs on one shoulder   Discussed how iron assists with breathing discussed unable to order from h 01/07/18 1200 Cm spoke with Staff at [facility] to confirm No available paramedic program/integrated medicine program.  Referred pt to [facility] SW for counseled her on the importance of keeping her appointments after being d/c from the hospital in 1-2 weeks post hospitalization Gets medicine in blister packs from [employer].    Upcoming appointment  02/04/18 at 1015 [name] Primary MD  01/14/18 0930 [name] GI  01/15/18 1300 [name] case closure - referred to [facility] at home program via e-mail     [name], RN, BSN, [place] Coordinator [phone] week day mobile

## 2018-01-10 NOTE — Telephone Encounter (Signed)
Pt notified of results

## 2018-01-14 ENCOUNTER — Other Ambulatory Visit: Payer: Self-pay

## 2018-01-14 ENCOUNTER — Encounter: Payer: Self-pay | Admitting: Gastroenterology

## 2018-01-14 ENCOUNTER — Ambulatory Visit (INDEPENDENT_AMBULATORY_CARE_PROVIDER_SITE_OTHER): Payer: Medicare HMO | Admitting: Gastroenterology

## 2018-01-14 VITALS — BP 153/71 | HR 116 | Temp 98.2°F | Ht 64.0 in | Wt 179.0 lb

## 2018-01-14 DIAGNOSIS — D500 Iron deficiency anemia secondary to blood loss (chronic): Secondary | ICD-10-CM

## 2018-01-14 DIAGNOSIS — J9610 Chronic respiratory failure, unspecified whether with hypoxia or hypercapnia: Secondary | ICD-10-CM | POA: Diagnosis not present

## 2018-01-14 DIAGNOSIS — J441 Chronic obstructive pulmonary disease with (acute) exacerbation: Secondary | ICD-10-CM | POA: Diagnosis not present

## 2018-01-14 DIAGNOSIS — D509 Iron deficiency anemia, unspecified: Secondary | ICD-10-CM

## 2018-01-14 NOTE — Progress Notes (Signed)
cc'ed to pcp °

## 2018-01-14 NOTE — Progress Notes (Signed)
Primary Care Physician: Iona Beard, MD  Primary Gastroenterologist:  Garfield Cornea, MD   Chief Complaint  Patient presents with  . Anemia    f/u, doing ok    HPI: Erica [name] is a 67 y.o. female here for follow up. Last seen in office in 09/2017 for hospital follow up.   She was seen back in December for new onset microcytic anemia, heme positive stool, abdominal pain.  She actually presented to the hospital with worsening shortness of breath, dyspnea on exertion.  She was heme positive in the ED.  New onset microcytic anemia with hemoglobin of 7.5.  Hemoglobin in May 2018 was normal at 12.1.  CT abdomen/pelvis with contrast showed mild acute diverticulitis involving the proximal sigmoid colon. She endorsed significant ibuprofen use for back pain (postherpetic neuralgia).  She had also complained of upper abdominal pain for about a month.  Denied lower abdominal pain.Treated for diverticulitis given compelling CT findings.  She underwent an endoscopy during her hospitalization showing small hiatal hernia, gastric fundal diverticulum.  It was felt that her upper abdominal pain was likely related to NSAID use.   She had attempted colonoscopy in 09/2017, prep was suboptimal. She missed follow up OV in 11/2017. She has been in hospital twice in 11/2017 with COPD/CHF exacerbations.   Her Hgb has remained stable over the past several months in the 9 range. She has dark stools on iron. No brbpr. She complains of bloating but relieved with passing of gas. BM regular. Appetite good. No n/v. Heartburn controlled on omeprazole and pepcid.   Current Outpatient Medications  Medication Sig Dispense Refill  . amLODipine (NORVASC) 5 MG tablet Take 2 tablets (10 mg total) by mouth daily. 30 tablet 1  . aspirin 81 MG tablet Take 2 tablets by mouth daily.     Marland Kitchen atorvastatin (LIPITOR) 80 MG tablet Take 80 mg by mouth daily.     . budesonide-formoterol (SYMBICORT) 160-4.5 MCG/ACT inhaler Inhale 2  puffs into the lungs 2 (two) times daily. 1 Inhaler 3  . buPROPion (WELLBUTRIN SR) 150 MG 12 hr tablet Take 150 mg by mouth 2 (two) times daily.    . clopidogrel (PLAVIX) 75 MG tablet Take 75 mg by mouth daily.    . famotidine (PEPCID) 20 MG tablet Take 20 mg by mouth 2 (two) times daily.    . furosemide (LASIX) 40 MG tablet Take 1 tablet (40 mg total) by mouth daily. 30 tablet 1  . ipratropium-albuterol (DUONEB) 0.5-2.5 (3) MG/3ML SOLN Take 3 mLs by nebulization every 6 (six) hours as needed (for shortness of breath).    . Linaclotide (LINZESS) 145 MCG CAPS capsule Take 145 mcg by mouth daily.    . metoprolol tartrate (LOPRESSOR) 25 MG tablet Take 0.5 tablets (12.5 mg total) by mouth 2 (two) times daily. 60 tablet 1  . omega-3 acid ethyl esters (LOVAZA) 1 g capsule Take 2 g by mouth 2 (two) times daily.     Marland Kitchen omeprazole (PRILOSEC) 20 MG capsule Take 20 mg by mouth daily.    . OXYGEN Inhale 2 L into the lungs continuous.    . saxagliptin HCl (ONGLYZA) 5 MG TABS tablet Take 5 mg by mouth daily. Reported on 09/22/2015    . SPIRIVA HANDIHALER 18 MCG inhalation capsule Take 1 puff by mouth daily.     No current facility-administered medications for this visit.     Allergies as of 01/14/2018  . (No Known Allergies)  ROS:  General: Negative for anorexia, weight loss, fever, chills,+ fatigue, weakness. ENT: Negative for hoarseness, difficulty swallowing , nasal congestion. CV: Negative for chest pain, angina, palpitations, +dyspnea on exertion, peripheral edema.  Respiratory: Negative for dyspnea at rest, +dyspnea on exertion, cough, sputum, wheezing.  GI: See history of present illness. GU:  Negative for dysuria, hematuria, urinary incontinence, urinary frequency, nocturnal urination.  Endo: Negative for unusual weight change.    Physical Examination:   BP (!) 153/71   Pulse (!) 116   Temp 98.2 F (36.8 C) (Oral)   Ht 5\' 4"  (1.626 m)   Wt 179 lb (81.2 kg)   BMI 30.73 kg/m    General: chronically ill appearing female in NAD. Oxygen via [place].   Eyes: No icterus. Mouth: Oropharyngeal mucosa moist and pink , no lesions erythema or exudate. Lungs: diminished breath sounds throughout.  Heart: Regular rate and rhythm, no murmurs rubs or gallops.  Abdomen: Bowel sounds are normal, nontender, nondistended, no hepatosplenomegaly or masses, no abdominal bruits or hernia , no rebound or guarding.   Extremities: No lower extremity edema. No clubbing or deformities. Neuro: Alert and oriented x 4   Skin: Warm and dry, no jaundice.   Psych: Alert and cooperative, normal mood and affect.  Labs:  Lab Results  Component Value Date   CREATININE 1.57 (H) 12/21/2017   BUN 32 (H) 12/21/2017   NA 143 12/21/2017   K 5.2 (H) 12/21/2017   CL 99 (L) 12/21/2017   CO2 33 (H) 12/21/2017   Lab Results  Component Value Date   ALT 29 12/17/2017   AST 17 12/17/2017   ALKPHOS 66 12/17/2017   BILITOT 0.5 12/17/2017   Lab Results  Component Value Date   WBC 6.5 12/21/2017   HGB 9.4 (L) 12/21/2017   HCT 31.9 (L) 12/21/2017   MCV 82.6 12/21/2017   PLT 282 12/21/2017   Lab Results  Component Value Date   IRON 28 (L) 10/03/2017   TIBC 389 10/03/2017   FERRITIN 25 10/03/2017   No results found for: [id number]  Imaging Studies: Ct Chest High Resolution  Result Date: 12/20/2017 CLINICAL DATA:  67 year old female with history of abnormal chest x-ray. Evaluate for potential interstitial lung disease. EXAM: CT CHEST WITHOUT CONTRAST TECHNIQUE: Multidetector CT imaging of the chest was performed following the standard protocol without intravenous contrast. High resolution imaging of the lungs, as well as inspiratory and expiratory imaging, was performed. COMPARISON:  No priors. FINDINGS: Cardiovascular: Heart size is mildly enlarged. There is no significant pericardial fluid, thickening or pericardial calcification. There is aortic atherosclerosis, as well as atherosclerosis of the  great vessels of the mediastinum and the coronary arteries, including calcified atherosclerotic plaque in the left main, left anterior descending, left circumflex and right coronary arteries. Mediastinum/Nodes: Several borderline enlarged and mildly enlarged mediastinal and bilateral hilar lymph nodes are noted, largest of which is in the AP window nodal station measuring 18 mm in short axis. Esophagus is unremarkable in appearance. No axillary lymphadenopathy. Lungs/Pleura: Calcified pleural plaques are noted in the thorax bilaterally, indicative of asbestos related pleural disease. Diffuse bronchial wall thickening with mild centrilobular and paraseptal emphysema. High-resolution images demonstrate patchy multifocal areas of mild ground-glass attenuation, extensive septal thickening, thickening of the peribronchovascular interstitium, scattered areas of mild cylindrical bronchiectasis and peripheral bronchiolectasis, as well as areas of parent honeycombing. These findings have no well-defined craniocaudal gradient, and the greatest degree of honeycombing is in the upper lungs. Inspiratory and expiratory imaging is considered  nondiagnostic as both phases appear to be inspiratory. No acute consolidative airspace disease. No pleural effusions. Rounded pleural-based density with pleural tails in the posterior aspect of the right lower lobe abutting pleural plaques, most compatible with an area of rounded atelectasis (axial image 87 of series 7). Upper Abdomen: Large proximal gastric diverticulum incidentally noted. Aortic atherosclerosis. Severe atrophy of the right kidney. Musculoskeletal: There are no aggressive appearing lytic or blastic lesions noted in the visualized portions of the skeleton. IMPRESSION: 1. Very unusual appearance of the lungs which is compatible with interstitial lung disease. 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 Accordingly,  some of the interstitial lung changes could represent a manifestation of asbestosis. In particular, with the appearance in the lower lungs is favored to reflect nonspecific interstitial pneumonia (NSIP). However, the unusual appearance in the upper lungs which includes honeycombing could suggest a concurrent process such as chronic hypersensitivity pneumonitis. Outpatient referral to pulmonology for further evaluation is recommended in the near future. Additionally, repeat high-resolution chest CT is suggested in 12 months to assess for temporal changes in the appearance of the lung parenchyma. 2. There is also mild diffuse bronchial wall thickening with mild centrilobular and paraseptal emphysema; imaging findings suggestive of underlying COPD. 3. Aortic atherosclerosis, in addition to left main and 3 vessel coronary artery disease. Please note that although the presence of coronary artery calcium documents the presence of coronary artery disease, the severity of this disease and any potential stenosis cannot be assessed on this non-gated CT examination. Assessment for potential risk factor modification, dietary therapy or pharmacologic therapy may be warranted, if clinically indicated. 4. Mild cardiomegaly. 5. Severe atrophy of the right kidney. 6. Large gastric diverticulum. Aortic Atherosclerosis (ICD10-I70.0) and Emphysema (ICD10-J43.9). Electronically Signed   By: Vinnie Langton M.D.   On: 12/20/2017 08:45   Dg Chest Portable 1 View  Result Date: 12/17/2017 CLINICAL DATA:  Shortness of breath, recent pneumonia EXAM: PORTABLE CHEST 1 VIEW COMPARISON:  12/07/2017 FINDINGS: Cardiomegaly with vascular congestion. Diffuse interstitial prominence throughout the lungs compatible with interstitial edema, similar prior study. Possible small effusions. No acute bony abnormality. IMPRESSION: Mild pulmonary edema. Possible small effusions. No real change since prior study. Electronically Signed   By: Rolm Baptise  M.D.   On: 12/17/2017 18:11

## 2018-01-14 NOTE — Patient Outreach (Signed)
Erica [name] [facility]) Care Management  01/14/2018  Erica [name] [birth year] [id number]   Social Worker called patient regarding referral received on 01/08/18 for transportation resources.  Social Worker contacted [employer] to determine if patient has transportation benefits.  Per [facility] Customer Service Representative, patient has unlimited transportation to medical appointments that do not exceed 25 miles.  Customer Service Representative provided number to be contacted to arrange transportation.   Social Worker informed patient of this benefit through [place].  Social Worker also informed patient that Medicaid transportation is also an option since she has dual coverage.  Patient requested that contact information for [facility] and Medicaid transportation be sent to her via mail.  Social Retail [employer] with contact information for both resources.  Social Worker will follow up next week to ensure patient received information.   Ronn Melena, BSW Social Worker [phone]

## 2018-01-14 NOTE — Assessment & Plan Note (Addendum)
IDA, heme positive stool diagnosed in 08/2017 required blood transfusion. EGD unremarkable. CT with diverticulitis. Colonoscopy in 09/2017 with suboptimal bowel prep. Her Hgb has been stable in the 9 range without need for additional blood transfusions.   Will follow anemia labs in four weeks from now. Continue oral iron. To discuss further with Dr. Gala Romney regarding if another attempt of colonoscopy to be planned this year. Further recommendations to follow.   Patient's pulse was up some today in the office. Improved after rest. She has not taken her morning medications. She is asymptomatic. She is going to go take her medications and keep check on it. If persistent or develops symptoms, she will notify provider or go to ER.

## 2018-01-14 NOTE — Patient Instructions (Signed)
1. Please have labs done in one month to follow up on anemia. We will remind you. 2. I will discuss anemia further with Dr. Gala Romney regarding timing of your next colonoscopy. Further recommendations to follow.

## 2018-01-14 NOTE — Progress Notes (Deleted)
Cardiology Office Note    Date:  01/14/2018   ID:  Erica [name], DOB 1951/04/28, MRN [id number]  PCP:  Iona Beard, MD  Cardiologist: Carlyle Dolly, MD    No chief complaint on file.   History of Present Illness:    Erica [name] is a 67 y.o. female with past medical history of chronic hypoxic respiratory failure (on 2L [place] at baseline), chronic diastolic CHF, HTN, HLD, Type 2 DM, Stage 3 CKD, pSVT, prior CVA, and tobacco use  who presents to the office today for hospital follow-up.  She has experienced multiple admissions over the past several months for acute hypoxic respiratory failure.  Her most recent admission was from 12/17/2017 to 12/21/2017 for worsening dyspnea on exertion and at rest.  She initially required nonrebreather and was later transitioned to nasal cannula. Troponin values were found to be elevated to 0.53 and a limited echocardiogram was obtained to assess LV function. This showed a preserved EF of 60 to 65% with no regional wall motion abnormalities. Was noted to have mild mitral regurgitation. She was continued on her current medication regimen including ASA, Plavix, BB, and statin therapy with further consideration of outpatient stress testing once her symptoms improved.    Past Medical History:  Diagnosis Date  . Arthritis   . CHF (congestive heart failure) ([place])   . Chronic back pain   . COPD (chronic obstructive pulmonary disease) ([facility])    2L home O2  . Diabetes mellitus without complication ([place])   . Hyperlipidemia   . Hypertension   . Stroke (cerebrum) [facility])     Past Surgical History:  Procedure Laterality Date  . ABDOMINAL HYSTERECTOMY    . colonoscopy  2009   Dr. Gala Romney: cluster of diminutive rectal polyps and 2 diminutive polyps in rectosigmoid junction, hyperplastic  . COLONOSCOPY WITH PROPOFOL N/A 10/18/2017   Procedure: COLONOSCOPY WITH PROPOFOL;  Surgeon: Daneil Dolin, MD;  Location: [facility] SUITE;  Service: Endoscopy;  Laterality:  N/A;  9:30am  . ESOPHAGOGASTRODUODENOSCOPY (EGD) WITH PROPOFOL N/A 08/29/2017   Dr. Gala Romney with propofol: small hiatal hernia. 4cm fundal diverticulum.   Marland Kitchen left hand middle finger     in an accident at work, amputated    Current Medications: Outpatient Medications Prior to Visit  Medication Sig Dispense Refill  . amLODipine (NORVASC) 5 MG tablet Take 2 tablets (10 mg total) by mouth daily. 30 tablet 1  . aspirin 81 MG tablet Take 2 tablets by mouth daily.     Marland Kitchen atorvastatin (LIPITOR) 80 MG tablet Take 80 mg by mouth daily.     . budesonide-formoterol (SYMBICORT) 160-4.5 MCG/ACT inhaler Inhale 2 puffs into the lungs 2 (two) times daily. 1 Inhaler 3  . buPROPion (WELLBUTRIN SR) 150 MG 12 hr tablet Take 150 mg by mouth 2 (two) times daily.    . clopidogrel (PLAVIX) 75 MG tablet Take 75 mg by mouth daily.    . famotidine (PEPCID) 20 MG tablet Take 20 mg by mouth 2 (two) times daily.    . furosemide (LASIX) 40 MG tablet Take 1 tablet (40 mg total) by mouth daily. 30 tablet 1  . ipratropium-albuterol (DUONEB) 0.5-2.5 (3) MG/3ML SOLN Take 3 mLs by nebulization every 6 (six) hours as needed (for shortness of breath).    . Linaclotide (LINZESS) 145 MCG CAPS capsule Take 145 mcg by mouth daily.    . metoprolol tartrate (LOPRESSOR) 25 MG tablet Take 0.5 tablets (12.5 mg total) by mouth 2 (two) times  daily. 60 tablet 1  . omega-3 acid ethyl esters (LOVAZA) 1 g capsule Take 2 g by mouth 2 (two) times daily.     Marland Kitchen omeprazole (PRILOSEC) 20 MG capsule Take 20 mg by mouth daily.    . OXYGEN Inhale 2 L into the lungs continuous.    . saxagliptin HCl (ONGLYZA) 5 MG TABS tablet Take 5 mg by mouth daily. Reported on 09/22/2015    . SPIRIVA HANDIHALER 18 MCG inhalation capsule Take 1 puff by mouth daily.     No facility-administered medications prior to visit.      Allergies:   Patient has no known allergies.   Social History   Socioeconomic History  . Marital status: Divorced    Spouse name: Not on file   . Number of children: Not on file  . Years of education: Not on file  . Highest education level: Not on file  Occupational History  . Occupation: retired  Scientific laboratory technician  . Financial resource strain: Not on file  . Food insecurity:    Worry: Not on file    Inability: Not on file  . Transportation needs:    Medical: Not on file    Non-medical: Not on file  Tobacco Use  . Smoking status: Former Smoker    Packs/day: 1.00    Years: 52.00    Pack years: 52.00    Types: Cigarettes    Last attempt to quit: 09/23/2017    Years since quitting: 0.3  . Smokeless tobacco: Never Used  Substance and Sexual Activity  . Alcohol use: No  . Drug use: No  . Sexual activity: Not Currently  Lifestyle  . Physical activity:    Days per week: Not on file    Minutes per session: Not on file  . Stress: Not on file  Relationships  . Social connections:    Talks on phone: Not on file    Gets together: Not on file    Attends religious service: Not on file    Active member of club or organization: Not on file    Attends meetings of clubs or organizations: Not on file    Relationship status: Not on file  Other Topics Concern  . Not on file  Social History Narrative   ** Merged History Encounter **         Family History:  The patient's ***family history includes CVA (age of onset: 69) in her mother.   Review of Systems:   Please see the history of present illness.     General:  No chills, fever, night sweats or weight changes.  Cardiovascular:  No chest pain, dyspnea on exertion, edema, orthopnea, palpitations, paroxysmal nocturnal dyspnea. Dermatological: No rash, lesions/masses Respiratory: No cough, dyspnea Urologic: No hematuria, dysuria Abdominal:   No nausea, vomiting, diarrhea, bright red blood per rectum, melena, or hematemesis Neurologic:  No visual changes, wkns, changes in mental status. All other systems reviewed and are otherwise negative except as noted above.   Physical  Exam:    VS:  There were no vitals taken for this visit.   General: Well developed, well nourished,female appearing in no acute distress. Head: Normocephalic, atraumatic, sclera non-icteric, no xanthomas, nares are without discharge.  Neck: No carotid bruits. JVD not elevated.  Lungs: Respirations regular and unlabored, without wheezes or rales.  Heart: ***Regular rate and rhythm. No S3 or S4.  No murmur, no rubs, or gallops appreciated. Abdomen: Soft, non-tender, non-distended with normoactive bowel sounds. No hepatomegaly. No  rebound/guarding. No obvious abdominal masses. Msk:  Strength and tone appear normal for age. No joint deformities or effusions. Extremities: No clubbing or cyanosis. No edema.  Distal pedal pulses are 2+ bilaterally. Neuro: Alert and oriented X 3. Moves all extremities spontaneously. No focal deficits noted. Psych:  Responds to questions appropriately with a normal affect. Skin: No rashes or lesions noted  Wt Readings from Last 3 Encounters:  01/14/18 179 lb (81.2 kg)  01/08/18 177 lb (80.3 kg)  12/21/17 173 lb 8 oz (78.7 kg)        Studies/Labs Reviewed:   EKG:  EKG is*** ordered today.  The ekg ordered today demonstrates ***  Recent Labs: 08/30/2017: TSH 0.885 12/17/2017: ALT 29; B Natriuretic Peptide 583.0 12/19/2017: Magnesium 2.2 12/21/2017: BUN 32; Creatinine, Ser 1.57; Hemoglobin 9.4; Platelets 282; Potassium 5.2; Sodium 143   Lipid Panel No results found for: CHOL, TRIG, HDL, CHOLHDL, VLDL, LDLCALC, LDLDIRECT  Additional studies/ records that were reviewed today include:   Limited Echocardiogram: 12/18/2017 Study Conclusions  - Limited echo to evaluate LV function. - Left ventricle: The cavity size was normal. Wall thickness was   normal. Systolic function was normal. The estimated ejection   fraction was in the range of 60% to 65%. Wall motion was normal;   there were no regional wall motion abnormalities. - Aortic valve: Valve area (VTI):  2.17 cm^2. Valve area (Vmax):   2.14 cm^2. - Mitral valve: There was mild regurgitation.  Assessment:    No diagnosis found.   Plan:   In order of problems listed above:  1. ***    Medication Adjustments/Labs and Tests Ordered: Current medicines are reviewed at length with the patient today.  Concerns regarding medicines are outlined above.  Medication changes, Labs and Tests ordered today are listed in the Patient Instructions below. There are no Patient Instructions on file for this visit.   Signed, Erma Heritage, PA-C  01/14/2018 5:01 PM    [place] [address] [place],  [address] Phone: [phone]

## 2018-01-15 ENCOUNTER — Ambulatory Visit: Payer: Self-pay | Admitting: Student

## 2018-01-16 ENCOUNTER — Encounter: Payer: Self-pay | Admitting: Student

## 2018-01-16 NOTE — Progress Notes (Addendum)
Please let patient know that Dr. Gala Romney does want her to have another colonoscopy due to poor prep back in 09/2017. She has been in the hospital twice last month for copd/chf.   We plan on labs next month to follow up anemia.   Can we go ahead and get her scheduled for colonoscopy with propofol with RMR for sometime maybe early June or after?  -->Hold iron for 7 days -->Day of prep: take onglyza 1/2 tab daily.  -->Hold onglyza am of procedure.  -->two full days clear liquids. -->trilytely full jug day before TCS (like standard one day pre), and additional 1/2 jug AM of TCS.

## 2018-01-16 NOTE — Progress Notes (Signed)
Called patient, NA, Will call back

## 2018-01-16 NOTE — Progress Notes (Signed)
Called spoke with patient and she states she does not want to have TCS done. She states last time the prep made her very sick on her stomach and especially this time she is having to do an addition 1/2 jug. She states she just can't do that.

## 2018-01-18 ENCOUNTER — Other Ambulatory Visit: Payer: Self-pay | Admitting: *Deleted

## 2018-01-18 NOTE — Patient Outreach (Addendum)
Erica [name]) Care Management  01/18/2018  Erica [name] [birth year] [id number]   Late entry for 01/16/18 1818  Care coordination/EMMI CHF red flags/case closure    Verified patient's identity and verbal consent provided  Transition of care services completed 01/16/18 plus CM reviewed the below EMMI CHF red flags and response from [facility] home program staff   EMMI CHF red flags   01/10/18 did not weigh 01/12/18 did not weigh 01/13/18 did not weigh 01/14/18 yes to any new worsening problems wt recorded as 70 01/16/18 did not fill new Rx with questions about meds wt 176 lbs   01/14/18 0859 response from [facility] program staff member, Jennell Corner, Administrative Triage Coordinator [phone] Ext: [phone] to state that Erica [name] "does not qualify for HCCP/LTIH programs because they are currently enrolled in the Special Needs Program and cannot be co-managed with [facility]"  Erica [name] report she has received the scales to weigh herself from [name] on 01/16/18.  She had been using her scales at home which was providing an inaccurate reading. She denies any new worsening problems and reports her weight as 178 lbs. The weight of 70 on 01/14/18 is erroneous. CHF zone s/s review with denial of worsening s/s. Reports she is on her "way to church and I feel fine" She voiced understanding of not qualifying for the referral to [facility] program and "thinks I have" received a call from the special needs program. She voiced understanding of her medicines and had no questions. CM questioned to erroneous responses picked up by the EMMI automated system [facility] CMA updated also on 01/17/18 as another response for a red flag arrived  [name] CM aware the [name] SW has called her to assist with [name] transportation resources this week after CM sent a referral recently  [facility] CM and Erica [name] discussed case closure and she agreed to case closure She voices understanding of her home care  management interventions  Erica [name] understood that she has the least heaviest oxygen tank she is able to get after Cm spoke with her DME provider.   She did go to her 01/08/18 appointment with [name] but did not get a yellow zone chf action plan Nor has she been informed of a referral for a pulmonologist  Cm did not receive a return call from primary MD office about a yellow zone chf action plan or referral to a pulmonologist  On 01/10/18 an e-mail was sent to Dr Alyson Ingles of [name] for a peer to peer with [name] about the yellow zone chf action plan   Plans case closure for care management goals met  Letters sent to patient and MD Update [facility] SW  [facility] CM Care Plan Problem One     Most Recent Value  Care Plan Problem One  risk for re admission  Role Documenting the Problem One  Care Management Coordinator  Care Plan for Problem One  Active  [facility] Long Term Goal   over the next 45 days the patient will remain out of hospital with assist of transition of care managment and education for home care for congestive HF, review Congestive HF action plan  [facility] Long Term Goal Start Date  12/12/17  [facility] Long Term Goal Met Date  01/18/18  [name] CM Short Term Goal #1   over the next 7 days patient will be seen by her primary MD for further COPD/CHF zone planning  [facility] CM Short Term Goal #1  Start Date  01/01/18  [facility] CM Short Term Goal #1 Met Date  01/18/18    [name] CM Care Plan Problem Two     Most Recent Value  Care Plan Problem Two  heavy oxygen tank  Role Documenting the Problem Two  Care Management Coordinator  Care Plan for Problem Two  Active  [facility] Long Term Goal  over the next 31 days patient will be informed about possible lighter oxygen tank availability   [facility] Long Term Goal Start Date  01/01/18  [facility] Long Term Goal Met Date  01/08/18  [facility] CM Short Term Goal #1   over the next 7 days patient will verbalize she has a lighter oxygen tank or understand the DME alternative available to her after consutl with DME  supplier or MD  [name] CM Short Term Goal #1 Start Date  01/01/18  [facility] CM Short Term Goal #1 Met Date   01/08/18    [facility] CM Care Plan Problem Three     Most Recent Value  Care Plan Problem Three  not seen by a pulmonologist  Role Documenting the Problem Three  Care Management Coordinator  Care Plan for Problem Three  Active  [facility] Long Term Goal   over the next 31 days patient will be seen by a pulmonologist or her prirmary MD will provide a plan to manage her yellow zone s/s for COPD/CHf   [facility] Long Term Goal Start Date  01/01/18  [name] Long Term Goal Met Date  01/18/18  [facility] CM Short Term Goal #1   over the next 7 days patient will report she is scheduled to see a pulmonologist or have a plan from her primary MD to manage her COPD/CHf yellow zone symptoms during transition of care contact  [facility] CM Short Term Goal #1 Start Date  01/01/18  [name] CM Short Term Goal #1 Met Date  01/18/18  Interventions for Short Term Goal #1  she scheduled an appointment on 01/08/18 to be seen by her primary MD but not givne a yellow CHF zone action plan      Kimberly L. Lavina Hamman, RN, BSN, [place] Coordinator [phone] week day mobile

## 2018-01-21 ENCOUNTER — Other Ambulatory Visit: Payer: Self-pay | Admitting: *Deleted

## 2018-01-21 DIAGNOSIS — D500 Iron deficiency anemia secondary to blood loss (chronic): Secondary | ICD-10-CM | POA: Diagnosis not present

## 2018-01-21 DIAGNOSIS — R42 Dizziness and giddiness: Secondary | ICD-10-CM | POA: Diagnosis not present

## 2018-01-21 DIAGNOSIS — E119 Type 2 diabetes mellitus without complications: Secondary | ICD-10-CM | POA: Diagnosis not present

## 2018-01-21 NOTE — Patient Outreach (Signed)
[facility]) Care Management  01/21/2018  Erica [name] 11/12/1950 [id number]   Case closure red flag emmi chf  [name] denies medical concerns and reports possible EMMI error from 01/20/18 related to worsening s/s  Discussed case closure  No calls from united health care staff about special needs program [facility] SW following for transportation needs  Plans case closure Letters already sent to patient and MD [place] updated   [name] L. Lavina Hamman, RN, BSN, [place] Coordinator [phone] week day mobile

## 2018-01-22 ENCOUNTER — Ambulatory Visit: Payer: Self-pay

## 2018-01-22 ENCOUNTER — Other Ambulatory Visit: Payer: Self-pay

## 2018-01-22 NOTE — Patient Outreach (Signed)
[facility]) Care Management  01/22/2018  Erica [name] Mar 01, 1951 [id number]   Telephone outreach to patient to follow up on social work referral for transportation.  Patient confirmed that she received information sent on transportation services through [facility] and [place].  Patient reported that she has an upcoming appointment on 02/04/18 and intends to utilize one of these resources.  BSW encouraged patient to schedule transportation at least 3 business days in advance.  BSW will follow up with patient on 01/29/18 to ensure that patient scheduled transportation.    Ronn Melena, BSW Social Worker [phone]

## 2018-01-23 ENCOUNTER — Other Ambulatory Visit: Payer: Self-pay

## 2018-01-23 ENCOUNTER — Inpatient Hospital Stay (HOSPITAL_COMMUNITY)
Admission: EM | Admit: 2018-01-23 | Discharge: 2018-01-25 | DRG: 378 | Disposition: A | Discharge status: Home / Self Care | Payer: Medicare HMO | Attending: Internal Medicine | Admitting: Internal Medicine

## 2018-01-23 ENCOUNTER — Emergency Department (HOSPITAL_COMMUNITY): Payer: Medicare HMO

## 2018-01-23 ENCOUNTER — Encounter (HOSPITAL_COMMUNITY): Payer: Self-pay | Admitting: Emergency Medicine

## 2018-01-23 DIAGNOSIS — K922 Gastrointestinal hemorrhage, unspecified: Secondary | ICD-10-CM | POA: Diagnosis present

## 2018-01-23 DIAGNOSIS — I5030 Unspecified diastolic (congestive) heart failure: Secondary | ICD-10-CM | POA: Diagnosis present

## 2018-01-23 DIAGNOSIS — R195 Other fecal abnormalities: Secondary | ICD-10-CM | POA: Diagnosis present

## 2018-01-23 DIAGNOSIS — N189 Chronic kidney disease, unspecified: Secondary | ICD-10-CM | POA: Diagnosis not present

## 2018-01-23 DIAGNOSIS — K31811 Angiodysplasia of stomach and duodenum with bleeding: Principal | ICD-10-CM | POA: Diagnosis present

## 2018-01-23 DIAGNOSIS — J9611 Chronic respiratory failure with hypoxia: Secondary | ICD-10-CM | POA: Diagnosis present

## 2018-01-23 DIAGNOSIS — Z7902 Long term (current) use of antithrombotics/antiplatelets: Secondary | ICD-10-CM | POA: Diagnosis not present

## 2018-01-23 DIAGNOSIS — E785 Hyperlipidemia, unspecified: Secondary | ICD-10-CM | POA: Diagnosis present

## 2018-01-23 DIAGNOSIS — J449 Chronic obstructive pulmonary disease, unspecified: Secondary | ICD-10-CM | POA: Diagnosis not present

## 2018-01-23 DIAGNOSIS — Z7984 Long term (current) use of oral hypoglycemic drugs: Secondary | ICD-10-CM | POA: Diagnosis not present

## 2018-01-23 DIAGNOSIS — D500 Iron deficiency anemia secondary to blood loss (chronic): Secondary | ICD-10-CM | POA: Diagnosis present

## 2018-01-23 DIAGNOSIS — Z79899 Other long term (current) drug therapy: Secondary | ICD-10-CM

## 2018-01-23 DIAGNOSIS — G894 Chronic pain syndrome: Secondary | ICD-10-CM | POA: Diagnosis present

## 2018-01-23 DIAGNOSIS — I5032 Chronic diastolic (congestive) heart failure: Secondary | ICD-10-CM | POA: Diagnosis not present

## 2018-01-23 DIAGNOSIS — N183 Chronic kidney disease, stage 3 (moderate): Secondary | ICD-10-CM | POA: Diagnosis present

## 2018-01-23 DIAGNOSIS — J9612 Chronic respiratory failure with hypercapnia: Secondary | ICD-10-CM | POA: Diagnosis present

## 2018-01-23 DIAGNOSIS — Z7951 Long term (current) use of inhaled steroids: Secondary | ICD-10-CM

## 2018-01-23 DIAGNOSIS — Z7982 Long term (current) use of aspirin: Secondary | ICD-10-CM | POA: Diagnosis not present

## 2018-01-23 DIAGNOSIS — Z8673 Personal history of transient ischemic attack (TIA), and cerebral infarction without residual deficits: Secondary | ICD-10-CM

## 2018-01-23 DIAGNOSIS — K219 Gastro-esophageal reflux disease without esophagitis: Secondary | ICD-10-CM | POA: Diagnosis present

## 2018-01-23 DIAGNOSIS — K31819 Angiodysplasia of stomach and duodenum without bleeding: Secondary | ICD-10-CM

## 2018-01-23 DIAGNOSIS — D62 Acute posthemorrhagic anemia: Secondary | ICD-10-CM | POA: Diagnosis not present

## 2018-01-23 DIAGNOSIS — E876 Hypokalemia: Secondary | ICD-10-CM | POA: Diagnosis present

## 2018-01-23 DIAGNOSIS — J9610 Chronic respiratory failure, unspecified whether with hypoxia or hypercapnia: Secondary | ICD-10-CM | POA: Diagnosis present

## 2018-01-23 DIAGNOSIS — K264 Chronic or unspecified duodenal ulcer with hemorrhage: Secondary | ICD-10-CM | POA: Diagnosis not present

## 2018-01-23 DIAGNOSIS — K449 Diaphragmatic hernia without obstruction or gangrene: Secondary | ICD-10-CM | POA: Diagnosis present

## 2018-01-23 DIAGNOSIS — Z87891 Personal history of nicotine dependence: Secondary | ICD-10-CM

## 2018-01-23 DIAGNOSIS — Z9981 Dependence on supplemental oxygen: Secondary | ICD-10-CM

## 2018-01-23 DIAGNOSIS — R05 Cough: Secondary | ICD-10-CM | POA: Diagnosis not present

## 2018-01-23 DIAGNOSIS — E1122 Type 2 diabetes mellitus with diabetic chronic kidney disease: Secondary | ICD-10-CM | POA: Diagnosis not present

## 2018-01-23 DIAGNOSIS — E119 Type 2 diabetes mellitus without complications: Secondary | ICD-10-CM

## 2018-01-23 DIAGNOSIS — I10 Essential (primary) hypertension: Secondary | ICD-10-CM | POA: Diagnosis present

## 2018-01-23 DIAGNOSIS — I130 Hypertensive heart and chronic kidney disease with heart failure and stage 1 through stage 4 chronic kidney disease, or unspecified chronic kidney disease: Secondary | ICD-10-CM | POA: Diagnosis present

## 2018-01-23 DIAGNOSIS — K921 Melena: Secondary | ICD-10-CM | POA: Diagnosis not present

## 2018-01-23 LAB — COMPREHENSIVE METABOLIC PANEL
ALK PHOS: 69 U/L (ref 38–126)
ALT: 12 U/L — ABNORMAL LOW (ref 14–54)
ANION GAP: 11 (ref 5–15)
AST: 14 U/L — ABNORMAL LOW (ref 15–41)
Albumin: 3.3 g/dL — ABNORMAL LOW (ref 3.5–5.0)
BUN: 18 mg/dL (ref 6–20)
CALCIUM: 7 mg/dL — AB (ref 8.9–10.3)
CO2: 26 mmol/L (ref 22–32)
Chloride: 103 mmol/L (ref 101–111)
Creatinine, Ser: 1.71 mg/dL — ABNORMAL HIGH (ref 0.44–1.00)
GFR calc Af Amer: 34 mL/min — ABNORMAL LOW (ref >60)
GFR calc non Af Amer: 30 mL/min — ABNORMAL LOW (ref >60)
Glucose, Bld: 128 mg/dL — ABNORMAL HIGH (ref 65–99)
POTASSIUM: 2.9 mmol/L — AB (ref 3.5–5.1)
SODIUM: 140 mmol/L (ref 135–145)
TOTAL PROTEIN: 6.7 g/dL (ref 6.5–8.1)
Total Bilirubin: 0.5 mg/dL (ref 0.3–1.2)

## 2018-01-23 LAB — CBC
HCT: 24.4 % — ABNORMAL LOW (ref 36.0–46.0)
HEMOGLOBIN: 7.3 g/dL — AB (ref 12.0–15.0)
MCH: 25 pg — AB (ref 26.0–34.0)
MCHC: 29.9 g/dL — ABNORMAL LOW (ref 30.0–36.0)
MCV: 83.6 fL (ref 78.0–100.0)
Platelets: 329 10*3/uL (ref 150–400)
RBC: 2.92 MIL/uL — AB (ref 3.87–5.11)
RDW: 19.1 % — ABNORMAL HIGH (ref 11.5–15.5)
WBC: 5 10*3/uL (ref 4.0–10.5)

## 2018-01-23 LAB — PREPARE RBC (CROSSMATCH)

## 2018-01-23 LAB — POC OCCULT BLOOD, ED: FECAL OCCULT BLD: POSITIVE — AB

## 2018-01-23 LAB — PROTIME-INR
INR: 0.98
Prothrombin Time: 12.9 seconds (ref 11.4–15.2)

## 2018-01-23 MED ORDER — IPRATROPIUM-ALBUTEROL 0.5-2.5 (3) MG/3ML IN SOLN
3.0000 mL | Freq: Four times a day (QID) | RESPIRATORY_TRACT | Status: DC | PRN
  Administered 2018-01-23 – 2018-01-25 (×3): 3 mL via RESPIRATORY_TRACT
  Filled 2018-01-23 (×3): qty 3

## 2018-01-23 MED ORDER — ATORVASTATIN CALCIUM 40 MG PO TABS
80.0000 mg | ORAL_TABLET | Freq: Every day | ORAL | Status: DC
  Administered 2018-01-23 – 2018-01-24 (×2): 80 mg via ORAL
  Filled 2018-01-23 (×2): qty 2

## 2018-01-23 MED ORDER — ONDANSETRON HCL 4 MG/2ML IJ SOLN: 4.0000 mg | Freq: Four times a day (QID) | INTRAMUSCULAR | Status: DC | PRN

## 2018-01-23 MED ORDER — HYDROCHLOROTHIAZIDE 25 MG PO TABS
25.0000 mg | ORAL_TABLET | Freq: Every day | ORAL | Status: DC
  Administered 2018-01-23 – 2018-01-24 (×2): 25 mg via ORAL
  Filled 2018-01-23 (×2): qty 1

## 2018-01-23 MED ORDER — PANTOPRAZOLE SODIUM 40 MG IV SOLR
40.0000 mg | Freq: Once | INTRAVENOUS | Status: AC
  Administered 2018-01-23: 40 mg via INTRAVENOUS
  Filled 2018-01-23: qty 40

## 2018-01-23 MED ORDER — POTASSIUM CHLORIDE 20 MEQ PO PACK
40.0000 meq | PACK | Freq: Once | ORAL | Status: AC
  Administered 2018-01-23: 40 meq via ORAL
  Filled 2018-01-23: qty 2

## 2018-01-23 MED ORDER — ALBUTEROL SULFATE (2.5 MG/3ML) 0.083% IN NEBU
2.5000 mg | INHALATION_SOLUTION | Freq: Once | RESPIRATORY_TRACT | Status: AC
  Administered 2018-01-23: 2.5 mg via RESPIRATORY_TRACT
  Filled 2018-01-23: qty 3

## 2018-01-23 MED ORDER — SODIUM CHLORIDE 0.9 % IV SOLN: 10.0000 mL/h | Freq: Once | INTRAVENOUS | Status: DC

## 2018-01-23 MED ORDER — PANTOPRAZOLE SODIUM 40 MG PO TBEC
40.0000 mg | DELAYED_RELEASE_TABLET | Freq: Every day | ORAL | Status: DC
  Administered 2018-01-24: 40 mg via ORAL
  Filled 2018-01-23: qty 1

## 2018-01-23 MED ORDER — IRBESARTAN 150 MG PO TABS
150.0000 mg | ORAL_TABLET | Freq: Every day | ORAL | Status: DC
  Administered 2018-01-23 – 2018-01-24 (×2): 150 mg via ORAL
  Filled 2018-01-23: qty 1
  Filled 2018-01-23: qty 2

## 2018-01-23 MED ORDER — ONDANSETRON HCL 4 MG PO TABS: 4.0000 mg | ORAL_TABLET | Freq: Four times a day (QID) | ORAL | Status: DC | PRN

## 2018-01-23 MED ORDER — OMEGA-3-ACID ETHYL ESTERS 1 G PO CAPS
2.0000 g | ORAL_CAPSULE | Freq: Two times a day (BID) | ORAL | Status: DC
  Administered 2018-01-23 – 2018-01-24 (×3): 2 g via ORAL
  Filled 2018-01-23 (×3): qty 2

## 2018-01-23 MED ORDER — SODIUM CHLORIDE 0.9% FLUSH
3.0000 mL | Freq: Two times a day (BID) | INTRAVENOUS | Status: DC
  Administered 2018-01-23 – 2018-01-24 (×2): 3 mL via INTRAVENOUS

## 2018-01-23 MED ORDER — FUROSEMIDE 40 MG PO TABS
40.0000 mg | ORAL_TABLET | Freq: Every day | ORAL | Status: DC
  Administered 2018-01-24: 40 mg via ORAL
  Filled 2018-01-23: qty 1

## 2018-01-23 MED ORDER — SODIUM CHLORIDE 0.9 % IV SOLN: 250.0000 mL | INTRAVENOUS | Status: DC | PRN

## 2018-01-23 MED ORDER — MOMETASONE FURO-FORMOTEROL FUM 200-5 MCG/ACT IN AERO
2.0000 | INHALATION_SPRAY | Freq: Two times a day (BID) | RESPIRATORY_TRACT | Status: DC
  Administered 2018-01-23 – 2018-01-25 (×4): 2 via RESPIRATORY_TRACT
  Filled 2018-01-23 (×2): qty 8.8

## 2018-01-23 MED ORDER — AMLODIPINE BESYLATE 5 MG PO TABS
10.0000 mg | ORAL_TABLET | Freq: Every day | ORAL | Status: DC
  Administered 2018-01-24: 10 mg via ORAL
  Filled 2018-01-23: qty 2

## 2018-01-23 MED ORDER — LINACLOTIDE 145 MCG PO CAPS
145.0000 ug | ORAL_CAPSULE | Freq: Every day | ORAL | Status: DC
  Administered 2018-01-23 – 2018-01-24 (×2): 145 ug via ORAL
  Filled 2018-01-23: qty 1

## 2018-01-23 MED ORDER — IPRATROPIUM-ALBUTEROL 0.5-2.5 (3) MG/3ML IN SOLN
3.0000 mL | Freq: Once | RESPIRATORY_TRACT | Status: AC
  Administered 2018-01-23: 3 mL via RESPIRATORY_TRACT
  Filled 2018-01-23: qty 3

## 2018-01-23 MED ORDER — METOPROLOL TARTRATE 25 MG PO TABS
12.5000 mg | ORAL_TABLET | Freq: Two times a day (BID) | ORAL | Status: DC
  Administered 2018-01-23 – 2018-01-24 (×3): 12.5 mg via ORAL
  Filled 2018-01-23 (×3): qty 1

## 2018-01-23 MED ORDER — VALSARTAN-HYDROCHLOROTHIAZIDE 160-25 MG PO TABS: 1.0000 | ORAL_TABLET | Freq: Every day | ORAL | Status: DC

## 2018-01-23 MED ORDER — FAMOTIDINE 20 MG PO TABS
20.0000 mg | ORAL_TABLET | Freq: Every day | ORAL | Status: DC
  Administered 2018-01-24: 20 mg via ORAL
  Filled 2018-01-23: qty 1

## 2018-01-23 MED ORDER — SODIUM CHLORIDE 0.9% FLUSH: 3.0000 mL | INTRAVENOUS | Status: DC | PRN

## 2018-01-23 MED ORDER — BUPROPION HCL ER (SR) 150 MG PO TB12
150.0000 mg | ORAL_TABLET | Freq: Two times a day (BID) | ORAL | Status: DC
  Administered 2018-01-23 – 2018-01-24 (×3): 150 mg via ORAL
  Filled 2018-01-23 (×3): qty 1

## 2018-01-23 NOTE — ED Triage Notes (Signed)
Pt was sent by [facility] for low hgb and rectal bleeding.  Pt states no pain or weakness at this time.

## 2018-01-23 NOTE — H&P (Signed)
History and Physical    JOHANNE [name] TGG:[id number] DOB: December 12, 1950 DOA: 01/23/2018  PCP: Iona Beard, MD  Patient coming from: Home  Chief Complaint abnormal lab from PCP  HPI: Erica [name] is a 67 y.o. female with medical history significant of chronic GI bleed, congestive heart failure, COPD which see her primary care physician yesterday and had routine labs done which showed a low hemoglobin of 7.3.  Her last hemoglobin several months ago was over 9.  She was called and told to come the emergency department for evaluation.  Patient denies any chest pain or shortness of breath.  She says her stools are always dark because she takes iron pills.  She denies any bright red blood per rectum.  She denies any nausea vomiting or abdominal pain.  She had a colonoscopy the beginning of this year by her GI doctor Dr. Melony Overly who did not find any reason for her GI bleed.  Patient has heme positive stools here in the emergency department.  GI has been called who will be seeing the patient tomorrow.  She denies any fevers.  She denies any weakness.  Patient referred for admission for acute on chronic anemia likely secondary to chronic GI blood losses requiring blood transfusion at this time.   Review of Systems: As per HPI otherwise 10 point review of systems negative.    Past Medical History:  Diagnosis Date  . Arthritis   . CHF (congestive heart failure) ([place])   . Chronic back pain   . COPD (chronic obstructive pulmonary disease) (HCC)    2L home O2  . Diabetes mellitus without complication ([place])   . Hyperlipidemia   . Hypertension   . Stroke (cerebrum) [facility])     Past Surgical History:  Procedure Laterality Date  . ABDOMINAL HYSTERECTOMY    . colonoscopy  2009   Dr. Gala Romney: cluster of diminutive rectal polyps and 2 diminutive polyps in rectosigmoid junction, hyperplastic  . COLONOSCOPY WITH PROPOFOL N/A 10/18/2017   Procedure: COLONOSCOPY WITH PROPOFOL;  Surgeon: Daneil Dolin, MD;   Location: [facility];  Service: Endoscopy;  Laterality: N/A;  9:30am  . ESOPHAGOGASTRODUODENOSCOPY (EGD) WITH PROPOFOL N/A 08/29/2017   Dr. Gala Romney with propofol: small hiatal hernia. 4cm fundal diverticulum.   Marland Kitchen left hand middle finger     in an accident at work, amputated     reports that she quit smoking about 4 months ago. Her smoking use included cigarettes. She has a 52.00 pack-year smoking history. She has never used smokeless tobacco. She reports that she does not drink alcohol or use drugs.  No Known Allergies  Family History  Problem Relation Age of Onset  . CVA Mother 82  . Cancer Neg Hx   . Colon cancer Neg Hx     Prior to Admission medications   Medication Sig Start Date End Date Taking? Authorizing Provider  amLODipine (NORVASC) 10 MG tablet Take 1 tablet by mouth daily. 12/27/17  Yes [provider]  aspirin 81 MG tablet Take 2 tablets by mouth daily.  10/22/15  Yes [provider]  atorvastatin (LIPITOR) 80 MG tablet Take 80 mg by mouth daily.  12/04/16  Yes [provider]  budesonide-formoterol (SYMBICORT) 160-4.5 MCG/ACT inhaler Inhale 2 puffs into the lungs 2 (two) times daily. 11/23/15  Yes Barton Dubois, MD  buPROPion [facility] SR) 150 MG 12 hr tablet Take 150 mg by mouth 2 (two) times daily. 12/04/16  Yes [provider]  clopidogrel (PLAVIX) 75  MG tablet Take 75 mg by mouth daily.   Yes [provider]  famotidine (PEPCID) 20 MG tablet Take 20 mg by mouth 2 (two) times daily.   Yes [provider]  furosemide (LASIX) 40 MG tablet Take 1 tablet (40 mg total) by mouth daily. 12/23/17  Yes Dhungel, Nishant, MD  ipratropium-albuterol (DUONEB) 0.5-2.5 (3) MG/3ML SOLN Take 3 mLs by nebulization every 6 (six) hours as needed (for shortness of breath).   Yes [provider]  Linaclotide (LINZESS) 145 MCG CAPS capsule Take 145 mcg by mouth daily.   Yes [provider]  metoprolol tartrate (LOPRESSOR) 25  MG tablet Take 0.5 tablets (12.5 mg total) by mouth 2 (two) times daily. 08/30/17  Yes Tat, Shanon Brow, MD  omega-3 acid ethyl esters (LOVAZA) 1 g capsule Take 2 g by mouth 2 (two) times daily.  12/07/16  Yes [provider]  omeprazole (PRILOSEC) 20 MG capsule Take 20 mg by mouth daily. 12/04/16  Yes [provider]  OXYGEN Inhale 2 L into the lungs continuous.   Yes [provider]  saxagliptin HCl (ONGLYZA) 5 MG TABS tablet Take 5 mg by mouth daily. Reported on 09/22/2015   Yes [provider]  SPIRIVA HANDIHALER 18 MCG inhalation capsule Take 1 puff by mouth daily. 10/31/17  Yes [provider]  valsartan-hydrochlorothiazide (DIOVAN-HCT) 160-25 MG tablet Take 1 tablet by mouth daily. 12/27/17   [provider]    Physical Exam: Vitals:   01/23/18 1018 01/23/18 1130 01/23/18 1200 01/23/18 1510  BP:  100/81 131/62 131/62  Pulse:  79  76  Resp:  (!) 23 17 18   Temp:      TempSrc:      SpO2: 96% 100%  100%  Weight:      Height:          Constitutional: NAD, calm, comfortable Vitals:   01/23/18 1018 01/23/18 1130 01/23/18 1200 01/23/18 1510  BP:  100/81 131/62 131/62  Pulse:  79  76  Resp:  (!) 23 17 18   Temp:      TempSrc:      SpO2: 96% 100%  100%  Weight:      Height:       Eyes: PERRL, lids and conjunctivae normal ENMT: Mucous membranes are moist. Posterior pharynx clear of any exudate or lesions.Normal dentition.  Neck: normal, supple, no masses, no thyromegaly Respiratory: clear to auscultation bilaterally, no wheezing, no crackles. Normal respiratory effort. No accessory muscle use.  Cardiovascular: Regular rate and rhythm, no murmurs / rubs / gallops. No extremity edema. 2+ pedal pulses. No carotid bruits.  Abdomen: no tenderness, no masses palpated. No hepatosplenomegaly. Bowel sounds positive.  Musculoskeletal: no clubbing / cyanosis. No joint deformity upper and lower extremities. Good ROM, no contractures. Normal muscle  tone.  Skin: no rashes, lesions, ulcers. No induration Neurologic: CN 2-12 grossly intact. Sensation intact, DTR normal. Strength 5/5 bilaterally Psychiatric: Normal insight alert and oriented x 3. Normal mood.    Labs on Admission: I have personally reviewed following labs and imaging studies  CBC: Recent Labs  Lab 01/23/18 1052  WBC 5.0  HGB 7.3*  HCT 24.4*  MCV 83.6  PLT 583   Basic Metabolic Panel: Recent Labs  Lab 01/23/18 1052  NA 140  K 2.9*  CL 103  CO2 26  GLUCOSE 128*  BUN 18  CREATININE 1.71*  CALCIUM 7.0*   GFR: Estimated Creatinine Clearance: 32.2 mL/min (A) (by C-G formula based on SCr of  1.71 mg/dL (H)). Liver Function Tests: Recent Labs  Lab 01/23/18 1052  AST 14*  ALT 12*  ALKPHOS 69  BILITOT 0.5  PROT 6.7  ALBUMIN 3.3*   No results for input(s): LIPASE, AMYLASE in the last 168 hours. No results for input(s): AMMONIA in the last 168 hours. Coagulation Profile: Recent Labs  Lab 01/23/18 1109  INR 0.98   Cardiac Enzymes: No results for input(s): CKTOTAL, CKMB, CKMBINDEX, TROPONINI in the last 168 hours. BNP (last 3 results) No results for input(s): PROBNP in the last 8760 hours. HbA1C: No results for input(s): HGBA1C in the last 72 hours. CBG: No results for input(s): GLUCAP in the last 168 hours. Lipid Profile: No results for input(s): CHOL, HDL, LDLCALC, TRIG, CHOLHDL, LDLDIRECT in the last 72 hours. Thyroid Function Tests: No results for input(s): TSH, T4TOTAL, FREET4, T3FREE, THYROIDAB in the last 72 hours. Anemia Panel: No results for input(s): VITAMINB12, FOLATE, FERRITIN, TIBC, IRON, RETICCTPCT in the last 72 hours. Urine analysis:    Component Value Date/Time   COLORURINE YELLOW 03/14/2013 1847   APPEARANCEUR CLEAR 03/14/2013 1847   LABSPEC 1.025 03/14/2013 1847   PHURINE 6.0 03/14/2013 1847   GLUCOSEU NEGATIVE 03/14/2013 1847   HGBUR NEGATIVE 03/14/2013 1847   BILIRUBINUR NEGATIVE 03/14/2013 1847   KETONESUR NEGATIVE  03/14/2013 1847   PROTEINUR NEGATIVE 03/14/2013 1847   UROBILINOGEN 0.2 03/14/2013 1847   NITRITE NEGATIVE 03/14/2013 1847   LEUKOCYTESUR NEGATIVE 03/14/2013 1847   Sepsis Labs: !!!!!!!!!!!!!!!!!!!!!!!!!!!!!!!!!!!!!!!!!!!! @LABRCNTIP (procalcitonin:4,lacticidven:4) )No results found for this or any previous visit (from the past 240 hour(s)).   Radiological Exams on Admission: Dg Chest 2 View  Result Date: 01/23/2018 CLINICAL DATA:  Cough.  Wheezing. EXAM: CHEST - 2 VIEW COMPARISON:  12/17/2017 chest radiograph. FINDINGS: Stable cardiomediastinal silhouette with mild cardiomegaly. No pneumothorax. No pleural effusion. Hyperinflated lungs. There are diffuse irregular parahilar reticular opacities with associated architectural distortion. Calcified pleural plaques again noted bilaterally. Curvilinear bibasilar lung opacities. These findings are not appreciably changed. No superimposed acute consolidative airspace disease. IMPRESSION: 1. No acute consolidative airspace disease. 2. Stable prominent changes of chronic interstitial lung disease as recently detailed on 12/19/2017 high-resolution chest CT study. 3. Calcified bilateral pleural plaques compatible with asbestos related pleural disease. 4. Hyperinflated lungs suggesting COPD. Electronically Signed   By: Ilona Sorrel M.D.   On: 01/23/2018 12:23    EKG: Independently reviewed.  Normal sinus rhythm no acute changes  Old chart reviewed  Case discussed with EDP   Assessment/Plan 67 year old female with acute on chronic anemia suspected to be due to GI blood losses  Principal Problem:   GIB (gastrointestinal bleeding)-patient is already getting blood transfused in the emergency department and has not had an anemia panel pulled prior to transfusing.  Blood pressure stable.  She is grossly heme positive with dark stools.  No evidence of overt bleeding at this time.  Abdominal exam is benign.  Transfuse 2 units and observation night GI consulted  for the morning.  Holding aspirin and Plavix.  Active Problems:   Iron deficiency anemia due to chronic blood loss-noted   Chronic pain syndrome-stable   Diabetes mellitus type 2, controlled (HCC)-sliding scale insulin for now   Chronic respiratory failure (HCC)-stable at this time   Benign essential HTN stable-   (HFpEF) heart failure with preserved ejection fraction (HCC)- Continue her home Lasix dosin g heme positive stool-as above   CKD (chronic kidney disease) stable at baseline-     DVT prophylaxis: SCDs only Code Status: Full Family  Communication: None Disposition Plan: Per day team Consults called: GI Admission status: Observation    Cotton Beckley A MD [facility]  If 7PM-7AM, please contact night-coverage [email or web address] Password [facility]  01/23/2018, 3:34 PM

## 2018-01-23 NOTE — ED Provider Notes (Signed)
[facility] EMERGENCY DEPARTMENT Provider Note   CSN: [id number] Arrival date & time: 01/23/18  0354     History   Chief Complaint Chief Complaint  Patient presents with  . Rectal Bleeding    HPI Erica [name] is a 67 y.o. female.  HPI   Erica [name] is a 67 y.o. female with a history of type 2 diabetes mellitus, iron deficient anemia, CHF, and GI bleed who is anticoagulated on Plavix and aspirin for previous stroke, who was sent to the Emergency Department for further evaluation of low hemoglobin and rectal bleeding.  She states that she was seen by her PCP yesterday and had laboratory studies performed.  She states that she had a rectal exam that showed blood in her stool.  She was contacted this morning by her PCPs office and advised to come to the emergency room due to a drop in her hemoglobin.  She reports having some abdominal bloating but otherwise states that she feels "fine" she does admit to taking Plavix and aspirin daily, last dose yesterday.  Last colonoscopy was 10/18/2017 by Dr. Roseanne Kaufman office.  She denies vomiting, fever, abdominal pain, fatigue or generalized weakness.  She states that she chronically has black stools due to iron consumption Patient also complains of productive cough and wheezing for 1 week.  States that she has been using her inhalers without relief.  She denies chest pain or shortness of breath.  No fever.  She also denies any edema of the lower extremities.   Past Medical History:  Diagnosis Date  . Arthritis   . CHF (congestive heart failure) ([place])   . Chronic back pain   . COPD (chronic obstructive pulmonary disease) (HCC)    2L home O2  . Diabetes mellitus without complication ([place])   . Hyperlipidemia   . Hypertension   . Stroke (cerebrum) [facility]     Patient Active Problem List   Diagnosis Date Noted  . Hyperkalemia 12/21/2017  . Hyperlipidemia 12/18/2017  . Hypomagnesemia 12/17/2017  . Acute renal failure superimposed on stage 3  chronic kidney disease ([place]) 12/07/2017  . Acute on chronic respiratory failure with hypoxia ([place]) 12/07/2017  . COPD with acute exacerbation ([place]) 12/07/2017  . Acute diastolic CHF (congestive heart failure) ([place]) 09/19/2017  . Iron deficiency anemia due to chronic blood loss   . Acute diverticulitis 08/29/2017  . Heme positive stool   . Symptomatic anemia   . GI bleed 08/27/2017  . Elevated troponin 08/27/2017  . (HFpEF) heart failure with preserved ejection fraction ([place]) 08/27/2017  . Acute dyspnea   . Acute on chronic respiratory failure ([place]) 02/11/2017  . Acute on chronic diastolic CHF (congestive heart failure) ([place]) 02/11/2017  . Benign essential HTN 11/20/2015  . GERD (gastroesophageal reflux disease) 11/20/2015  . Tobacco abuse 11/20/2015  . Type 2 diabetes mellitus with hyperglycemia ([place]) 11/20/2015  . COPD exacerbation ([place]) 09/05/2015  . Chronic pain syndrome 09/05/2015  . Hypertension 09/05/2015  . Diabetes mellitus type 2, controlled ([place]) 09/05/2015  . Chronic respiratory failure ([place]) 09/05/2015  . OLECRANON BURSITIS, LEFT 08/04/2010    Past Surgical History:  Procedure Laterality Date  . ABDOMINAL HYSTERECTOMY    . colonoscopy  2009   Dr. Gala Romney: cluster of diminutive rectal polyps and 2 diminutive polyps in rectosigmoid junction, hyperplastic  . COLONOSCOPY WITH PROPOFOL N/A 10/18/2017   Procedure: COLONOSCOPY WITH PROPOFOL;  Surgeon: Daneil Dolin, MD;  Location: [facility] END[facility] SUITE;  Service: Endoscopy;  Laterality: N/A;  9:30am  .  ESOPHAGOGASTRODUODENOSCOPY (EGD) WITH PROPOFOL N/A 08/29/2017   Dr. Gala Romney with propofol: small hiatal hernia. 4cm fundal diverticulum.   Marland Kitchen left hand middle finger     in an accident at work, amputated     OB History    Gravida  0   Para  0   Term  0   Preterm  0   AB  0   Living        SAB  0   TAB  0   Ectopic  0   Multiple      Live Births               Home Medications    Prior to Admission  medications   Medication Sig Start Date End Date Taking? Authorizing Provider  amLODipine (NORVASC) 10 MG tablet Take 1 tablet by mouth daily. 12/27/17  Yes [provider]  aspirin 81 MG tablet Take 2 tablets by mouth daily.  10/22/15  Yes [provider]  atorvastatin (LIPITOR) 80 MG tablet Take 80 mg by mouth daily.  12/04/16  Yes [provider]  budesonide-formoterol (SYMBICORT) 160-4.5 MCG/ACT inhaler Inhale 2 puffs into the lungs 2 (two) times daily. 11/23/15  Yes Barton Dubois, MD  buPROPion [facility] SR) 150 MG 12 hr tablet Take 150 mg by mouth 2 (two) times daily. 12/04/16  Yes [provider]  clopidogrel (PLAVIX) 75 MG tablet Take 75 mg by mouth daily.   Yes [provider]  famotidine (PEPCID) 20 MG tablet Take 20 mg by mouth 2 (two) times daily.   Yes [provider]  furosemide (LASIX) 40 MG tablet Take 1 tablet (40 mg total) by mouth daily. 12/23/17  Yes Dhungel, Nishant, MD  ipratropium-albuterol (DUONEB) 0.5-2.5 (3) MG/3ML SOLN Take 3 mLs by nebulization every 6 (six) hours as needed (for shortness of breath).   Yes [provider]  Linaclotide (LINZESS) 145 MCG CAPS capsule Take 145 mcg by mouth daily.   Yes [provider]  metoprolol tartrate (LOPRESSOR) 25 MG tablet Take 0.5 tablets (12.5 mg total) by mouth 2 (two) times daily. 08/30/17  Yes Tat, Shanon Brow, MD  omega-3 acid ethyl esters (LOVAZA) 1 g capsule Take 2 g by mouth 2 (two) times daily.  12/07/16  Yes [provider]  omeprazole (PRILOSEC) 20 MG capsule Take 20 mg by mouth daily. 12/04/16  Yes [provider]  OXYGEN Inhale 2 L into the lungs continuous.   Yes [provider]  saxagliptin HCl (ONGLYZA) 5 MG TABS tablet Take 5 mg by mouth daily. Reported on 09/22/2015   Yes [provider]  SPIRIVA HANDIHALER 18 MCG inhalation capsule Take 1 puff by mouth daily. 10/31/17  Yes [provider]    valsartan-hydrochlorothiazide (DIOVAN-HCT) 160-25 MG tablet Take 1 tablet by mouth daily. 12/27/17   [provider]    Family History Family History  Problem Relation Age of Onset  . CVA Mother 15  . Cancer Neg Hx   . Colon cancer Neg Hx     Social History Social History   Tobacco Use  . Smoking status: Former Smoker    Packs/day: 1.00    Years: 52.00    Pack years: 52.00    Types: Cigarettes    Last attempt to quit: 09/23/2017    Years since quitting: 0.3  . Smokeless tobacco: Never Used  Substance Use Topics  . Alcohol use: No  . Drug use: No     Allergies   Patient  has no known allergies.   Review of Systems Review of Systems  Constitutional: Negative for appetite change, chills, fatigue and fever.  HENT: Negative for congestion, sore throat and trouble swallowing.   Respiratory: Positive for cough and wheezing. Negative for chest tightness and shortness of breath.   Cardiovascular: Negative for chest pain, palpitations and leg swelling.  Gastrointestinal: Positive for abdominal distention, blood in stool, nausea and vomiting. Negative for abdominal pain and diarrhea.  Genitourinary: Negative for decreased urine volume, difficulty urinating, dysuria and flank pain.  Musculoskeletal: Negative for back pain.  Skin: Negative for color change and rash.  Neurological: Negative for dizziness, syncope, speech difficulty, weakness and numbness.  Hematological: Negative for adenopathy.  All other systems reviewed and are negative.    Physical Exam Updated Vital Signs BP (!) 142/60 (BP Location: Right Arm)   Pulse 81   Temp 98.2 F (36.8 C) (Oral)   Resp 18   Ht 5\' 4"  (1.626 m)   Wt 79.8 kg (176 lb)   SpO2 96%   BMI 30.21 kg/m   Physical Exam  Constitutional: She appears well-developed and well-nourished. No distress.  HENT:  Head: Atraumatic.  Mouth/Throat: Oropharynx is clear and moist.  Neck: Normal range of motion. Neck supple.   Cardiovascular: Normal rate, regular rhythm and intact distal pulses.  No murmur heard. Pulmonary/Chest: Effort normal. No respiratory distress. She has wheezes. She exhibits no tenderness.  Scattered inspiratory and expiratory wheezes.  No rales.  Patient is able to speak in full and complete sentences without respiratory distress  Abdominal: Soft. She exhibits no distension and no mass. There is no tenderness. There is no rebound and no guarding.  Genitourinary: Rectal exam shows guaiac positive stool. Rectal exam shows no external hemorrhoid, no mass, no tenderness and anal tone normal.  Genitourinary Comments: Dark brown to black heme positive stools.  No rectal masses on exam.  Musculoskeletal: Normal range of motion.  Neurological: She is alert.  Skin: Skin is warm. Capillary refill takes less than 2 seconds.  Nursing note and vitals reviewed.    ED Treatments / Results  Labs (all labs ordered are listed, but only abnormal results are displayed) Labs Reviewed  COMPREHENSIVE METABOLIC PANEL - Abnormal; Notable for the following components:      Result Value   Potassium 2.9 (*)    Glucose, Bld 128 (*)    Creatinine, Ser 1.71 (*)    Calcium 7.0 (*)    Albumin 3.3 (*)    AST 14 (*)    ALT 12 (*)    GFR calc non Af Amer 30 (*)    GFR calc Af Amer 34 (*)    All other components within normal limits  CBC - Abnormal; Notable for the following components:   RBC 2.92 (*)    Hemoglobin 7.3 (*)    HCT 24.4 (*)    MCH 25.0 (*)    MCHC 29.9 (*)    RDW 19.1 (*)    All other components within normal limits  POC OCCULT BLOOD, ED - Abnormal; Notable for the following components:   Fecal Occult Bld POSITIVE (*)    All other components within normal limits  PROTIME-INR  TYPE AND SCREEN  PREPARE RBC (CROSSMATCH)    EKG None  Radiology Dg Chest 2 View  Result Date: 01/23/2018 CLINICAL DATA:  Cough.  Wheezing. EXAM: CHEST - 2 VIEW COMPARISON:  12/17/2017 chest radiograph.  FINDINGS: Stable cardiomediastinal silhouette with mild cardiomegaly. No pneumothorax. No pleural effusion.  Hyperinflated lungs. There are diffuse irregular parahilar reticular opacities with associated architectural distortion. Calcified pleural plaques again noted bilaterally. Curvilinear bibasilar lung opacities. These findings are not appreciably changed. No superimposed acute consolidative airspace disease. IMPRESSION: 1. No acute consolidative airspace disease. 2. Stable prominent changes of chronic interstitial lung disease as recently detailed on 12/19/2017 high-resolution chest CT study. 3. Calcified bilateral pleural plaques compatible with asbestos related pleural disease. 4. Hyperinflated lungs suggesting COPD. Electronically Signed   By: Ilona Sorrel M.D.   On: 01/23/2018 12:23    Procedures Procedures (including critical care time)  Medications Ordered in ED Medications  0.9 %  sodium chloride infusion (has no administration in time range)  ipratropium-albuterol (DUONEB) 0.5-2.5 (3) MG/3ML nebulizer solution 3 mL (3 mLs Nebulization Given 01/23/18 1118)  albuterol (PROVENTIL) (2.5 MG/3ML) 0.083% nebulizer solution 2.5 mg (2.5 mg Nebulization Given 01/23/18 1119)  pantoprazole (PROTONIX) injection 40 mg (40 mg Intravenous Given 01/23/18 1242)  potassium chloride (KLOR-CON) packet 40 mEq (40 mEq Oral Given 01/23/18 1241)     Initial Impression / Assessment and Plan / ED Course  I have reviewed the triage vital signs and the nursing notes.  Pertinent labs & imaging results that were available during my care of the patient were reviewed by me and considered in my medical decision making (see chart for details).     On review of medical records patient's hemoglobin has dropped from 9.4 in March to 7.3 today.  She is otherwise asymptomatic and vitals stable.  It was also noted that patient is hypokalemic, but does take Lasix daily. Given po potassium   1440  Consulted Dr. Laural Golden, recommends  hospitalist admit, clear fluids and blood transfusion.     [place] hospitalist Dr. Shanon Brow, who agrees to admit.    Final Clinical Impressions(s) / ED Diagnoses   Final diagnoses:  Lower GI bleed    ED Discharge Orders    None       Kem Parkinson, PA-C 01/23/18 1536    Nat Christen, MD 01/25/18 1008

## 2018-01-23 NOTE — ED Notes (Signed)
Endo Nurse called back for Dr. Laural Golden, Stating he is in a procedure, Tammy Triplett notified.

## 2018-01-24 DIAGNOSIS — E1122 Type 2 diabetes mellitus with diabetic chronic kidney disease: Secondary | ICD-10-CM

## 2018-01-24 DIAGNOSIS — R195 Other fecal abnormalities: Secondary | ICD-10-CM

## 2018-01-24 DIAGNOSIS — I5030 Unspecified diastolic (congestive) heart failure: Secondary | ICD-10-CM

## 2018-01-24 DIAGNOSIS — D500 Iron deficiency anemia secondary to blood loss (chronic): Secondary | ICD-10-CM

## 2018-01-24 DIAGNOSIS — K922 Gastrointestinal hemorrhage, unspecified: Secondary | ICD-10-CM

## 2018-01-24 DIAGNOSIS — I10 Essential (primary) hypertension: Secondary | ICD-10-CM

## 2018-01-24 DIAGNOSIS — J9611 Chronic respiratory failure with hypoxia: Secondary | ICD-10-CM

## 2018-01-24 LAB — BASIC METABOLIC PANEL
Anion gap: 8 (ref 5–15)
BUN: 12 mg/dL (ref 6–20)
CHLORIDE: 108 mmol/L (ref 101–111)
CO2: 28 mmol/L (ref 22–32)
CREATININE: 1.29 mg/dL — AB (ref 0.44–1.00)
Calcium: 7 mg/dL — ABNORMAL LOW (ref 8.9–10.3)
GFR calc non Af Amer: 42 mL/min — ABNORMAL LOW (ref >60)
GFR, EST AFRICAN AMERICAN: 48 mL/min — AB (ref >60)
Glucose, Bld: 111 mg/dL — ABNORMAL HIGH (ref 65–99)
Potassium: 3.8 mmol/L (ref 3.5–5.1)
SODIUM: 144 mmol/L (ref 135–145)

## 2018-01-24 LAB — CBC
HCT: 33.1 % — ABNORMAL LOW (ref 36.0–46.0)
HEMOGLOBIN: 10.4 g/dL — AB (ref 12.0–15.0)
MCH: 26.5 pg (ref 26.0–34.0)
MCHC: 31.4 g/dL (ref 30.0–36.0)
MCV: 84.4 fL (ref 78.0–100.0)
PLATELETS: 301 10*3/uL (ref 150–400)
RBC: 3.92 MIL/uL (ref 3.87–5.11)
RDW: 16.5 % — ABNORMAL HIGH (ref 11.5–15.5)
WBC: 6.2 10*3/uL (ref 4.0–10.5)

## 2018-01-24 MED ORDER — IPRATROPIUM-ALBUTEROL 0.5-2.5 (3) MG/3ML IN SOLN
3.0000 mL | Freq: Two times a day (BID) | RESPIRATORY_TRACT | Status: DC
  Administered 2018-01-24: 3 mL via RESPIRATORY_TRACT
  Filled 2018-01-24 (×2): qty 3

## 2018-01-24 NOTE — Consult Note (Signed)
Referring Provider: No ref. provider found Primary Care Physician:  Iona Beard, MD Primary Gastroenterologist:  Dr. Gala Romney  Date of Admission: 01/23/18 Date of Consultation: 01/24/18  Reason for Consultation:  GI bleed  HPI:  Erica [name] is a 67 y.o. female with a past medical history of CHF, chronic back pain, COPD, diabetes, hyperlipidemia, CVA, hypertension.  Patient was last seen in our office 01/14/2018 for iron deficiency anemia.  At that time it was noted that in December 2018 she had heme positive stool and abdominal pain.  Heme positive in the ED, new onset microcytic anemia with hemoglobin 7.5.  Normalized by 02/11/17 to 12.1.  CT of the abdomen and pelvis showed acute diverticulitis of the proximal sigmoid colon, admitted ibuprofen use.  She was treated for diverticulitis.  EGD during hospitalization at that time noted small hiatal hernia, gastric fundal diverticulum.  Felt upper abdominal pain likely due to NSAIDs.  Attempted colonoscopy in January 2019 with suboptimal prep.  Missed follow-up office visit.  Hemoglobin stable over the past several months in the 9 range.  Dark stools on iron.  Discussion with provider who last saw the patient states that the patient declined further colonoscopy.  For her present illness she presented the emergency room yesterday, 01/23/2018 for rectal bleeding.  ER provider notes reviewed.  H&P reviewed.  Labs showed hemoglobin declined to 7.3.  States stools are always dark because of iron pills.  Denies any bright red blood per rectum.  Denies any nausea or vomiting, abdominal pain.  Heme positive stools in the ER.  Denies any other overt anemia symptoms.  Recommended transfusion of 2 units PRBCs in the emergency department.  Anemia panel was drawn prior to transfusion.  Blood pressure stable.  Abdominal exam benign.  Recommend hold aspirin and Plavix.  After transfusion hemoglobin is increased to 10.4.  Today she states she has not seen any rectal  bleeding.  She has chronic dark stools on iron.  Denies abdominal pain, nausea, vomiting, weakness, dizziness, chest pain.  She has had some worsening shortness of breath yesterday.  She is on oxygen chronically at home 2 L/min.  History of COPD.  She denies taking any NSAIDs, or aspirin powders.  Denies overt GERD symptoms.  She remains on her PPI at home.  She did have a bowel movement today which was noted to be brown.  No other GI complaints.  Past Medical History:  Diagnosis Date  . Arthritis   . CHF (congestive heart failure) ([place])   . Chronic back pain   . COPD (chronic obstructive pulmonary disease) (H[facility])    2L home O2  . Diabetes mellitus without complication ([place])   . Hyperlipidemia   . Hypertension   . Stroke (cerebrum) [facility])     Past Surgical History:  Procedure Laterality Date  . ABDOMINAL HYSTERECTOMY    . colonoscopy  2009   Dr. Gala Romney: cluster of diminutive rectal polyps and 2 diminutive polyps in rectosigmoid junction, hyperplastic  . COLONOSCOPY WITH PROPOFOL N/A 10/18/2017   Procedure: COLONOSCOPY WITH PROPOFOL;  Surgeon: Daneil Dolin, MD;  Location: [facility];  Service: Endoscopy;  Laterality: N/A;  9:30am  . ESOPHAGOGASTRODUODENOSCOPY (EGD) WITH PROPOFOL N/A 08/29/2017   Dr. Gala Romney with propofol: small hiatal hernia. 4cm fundal diverticulum.   Marland Kitchen left hand middle finger     in an accident at work, amputated    Prior to Admission medications   Medication Sig Start Date End Date Taking? Authorizing Provider  amLODipine (NORVASC)  10 MG tablet Take 1 tablet by mouth daily. 12/27/17  Yes [provider]  aspirin 81 MG tablet Take 2 tablets by mouth daily.  10/22/15  Yes [provider]  atorvastatin (LIPITOR) 80 MG tablet Take 80 mg by mouth daily.  12/04/16  Yes [provider]  budesonide-formoterol (SYMBICORT) 160-4.5 MCG/ACT inhaler Inhale 2 puffs into the lungs 2 (two) times daily. 11/23/15  Yes Barton Dubois, MD  buPROPion [facility]  SR) 150 MG 12 hr tablet Take 150 mg by mouth 2 (two) times daily. 12/04/16  Yes [provider]  clopidogrel (PLAVIX) 75 MG tablet Take 75 mg by mouth daily.   Yes [provider]  famotidine (PEPCID) 20 MG tablet Take 20 mg by mouth 2 (two) times daily.   Yes [provider]  furosemide (LASIX) 40 MG tablet Take 1 tablet (40 mg total) by mouth daily. 12/23/17  Yes Dhungel, Nishant, MD  ipratropium-albuterol (DUONEB) 0.5-2.5 (3) MG/3ML SOLN Take 3 mLs by nebulization every 6 (six) hours as needed (for shortness of breath).   Yes [provider]  Linaclotide (LINZESS) 145 MCG CAPS capsule Take 145 mcg by mouth daily.   Yes [provider]  metoprolol tartrate (LOPRESSOR) 25 MG tablet Take 0.5 tablets (12.5 mg total) by mouth 2 (two) times daily. 08/30/17  Yes Tat, Shanon Brow, MD  omega-3 acid ethyl esters (LOVAZA) 1 g capsule Take 2 g by mouth 2 (two) times daily.  12/07/16  Yes [provider]  omeprazole (PRILOSEC) 20 MG capsule Take 20 mg by mouth daily. 12/04/16  Yes [provider]  OXYGEN Inhale 2 L into the lungs continuous.   Yes [provider]  saxagliptin HCl (ONGLYZA) 5 MG TABS tablet Take 5 mg by mouth daily. Reported on 09/22/2015   Yes [provider]  SPIRIVA HANDIHALER 18 MCG inhalation capsule Take 1 puff by mouth daily. 10/31/17  Yes [provider]  valsartan-hydrochlorothiazide (DIOVAN-HCT) 160-25 MG tablet Take 1 tablet by mouth daily. 12/27/17   [provider]    Current Facility-Administered Medications  Medication Dose Route Frequency Provider Last Rate Last Dose  . 0.9 %  sodium chloride infusion  250 mL Intravenous PRN Derrill Kay A, MD      . amLODipine (NORVASC) tablet 10 mg  10 mg Oral Daily Derrill Kay A, MD   10 mg at 01/24/18 1103  . atorvastatin (LIPITOR) tablet 80 mg  80 mg Oral q1800 Derrill Kay A, MD   80 mg at 01/23/18 2200  . buPROPion [facility] SR) 12 hr tablet 150  mg  150 mg Oral BID Derrill Kay A, MD   150 mg at 01/24/18 1107  . famotidine (PEPCID) tablet 20 mg  20 mg Oral Daily Derrill Kay A, MD   20 mg at 01/24/18 1104  . furosemide (LASIX) tablet 40 mg  40 mg Oral Daily Derrill Kay A, MD   40 mg at 01/24/18 1105  . irbesartan (AVAPRO) tablet 150 mg  150 mg Oral Daily Derrill Kay A, MD   150 mg at 01/24/18 1103   And  . hydrochlorothiazide (HYDRODIURIL) tablet 25 mg  25 mg Oral Daily Derrill Kay A, MD   25 mg at 01/24/18 1105  . ipratropium-albuterol (DUONEB) 0.5-2.5 (3) MG/3ML nebulizer solution 3 mL  3 mL Nebulization Q6H PRN Phillips Grout, MD   3 mL at 01/24/18 0821  . ipratropium-albuterol (DUONEB) 0.5-2.5 (3) MG/3ML nebulizer solution 3 mL  3 mL Nebulization BID Isaac Bliss,  Rayford Halsted, MD      . linaclotide Rolan Lipa) capsule 145 mcg  145 mcg Oral Daily Phillips Grout, MD   145 mcg at 01/24/18 1107  . metoprolol tartrate (LOPRESSOR) tablet 12.5 mg  12.5 mg Oral BID Derrill Kay A, MD   12.5 mg at 01/24/18 1108  . mometasone-formoterol (DULERA) 200-5 MCG/ACT inhaler 2 puff  2 puff Inhalation BID Phillips Grout, MD   2 puff at 01/24/18 0819  . omega-3 acid ethyl esters (LOVAZA) capsule 2 g  2 g Oral BID Derrill Kay A, MD   2 g at 01/24/18 1102  . ondansetron (ZOFRAN) tablet 4 mg  4 mg Oral Q6H PRN Phillips Grout, MD       Or  . ondansetron (ZOFRAN) injection 4 mg  4 mg Intravenous Q6H PRN Phillips Grout, MD      . pantoprazole (PROTONIX) EC tablet 40 mg  40 mg Oral Daily Derrill Kay A, MD   40 mg at 01/24/18 1103  . sodium chloride flush (NS) 0.9 % injection 3 mL  3 mL Intravenous Q12H Derrill Kay A, MD   3 mL at 01/23/18 2234  . sodium chloride flush (NS) 0.9 % injection 3 mL  3 mL Intravenous PRN Phillips Grout, MD        Allergies as of 01/23/2018  . (No Known Allergies)    Family History  Problem Relation Age of Onset  . CVA Mother 62  . Cancer Neg Hx   . Colon cancer Neg Hx     Social History    Socioeconomic History  . Marital status: Divorced    Spouse name: Not on file  . Number of children: Not on file  . Years of education: Not on file  . Highest education level: Not on file  Occupational History  . Occupation: retired  Scientific laboratory technician  . Financial resource strain: Not on file  . Food insecurity:    Worry: Not on file    Inability: Not on file  . Transportation needs:    Medical: Not on file    Non-medical: Not on file  Tobacco Use  . Smoking status: Former Smoker    Packs/day: 1.00    Years: 52.00    Pack years: 52.00    Types: Cigarettes    Last attempt to quit: 09/23/2017    Years since quitting: 0.3  . Smokeless tobacco: Never Used  Substance and Sexual Activity  . Alcohol use: No  . Drug use: No  . Sexual activity: Not Currently  Lifestyle  . Physical activity:    Days per week: Not on file    Minutes per session: Not on file  . Stress: Not on file  Relationships  . Social connections:    Talks on phone: Not on file    Gets together: Not on file    Attends religious service: Not on file    Active member of club or organization: Not on file    Attends meetings of clubs or organizations: Not on file    Relationship status: Not on file  . Intimate partner violence:    Fear of current or ex partner: Not on file    Emotionally abused: Not on file    Physically abused: Not on file    Forced sexual activity: Not on file  Other Topics Concern  . Not on file  Social History Narrative   ** Merged History Encounter **  Review of Systems: General: Negative for anorexia, weight loss, fever, chills, fatigue, weakness. ENT: Negative for hoarseness, difficulty swallowing. CV: Negative for chest pain, angina, palpitations, peripheral edema.  Respiratory: Negative for dyspnea at rest, cough, sputum, wheezing.  GI: See history of present illness. MS: Negative for joint pain, low back pain.  Derm: Negative for rash or itching.  Endo: Negative for  unusual weight change.  Heme: Negative for bruising or bleeding. Allergy: Negative for rash or hives.  Physical Exam: Vital signs in last 24 hours: Temp:  [97.9 F (36.6 C)-99.3 F (37.4 C)] 97.9 F (36.6 C) (05/02 0450) Pulse Rate:  [72-88] 74 (05/02 1102) Resp:  [14-22] 18 (05/02 0450) BP: (101-172)/(40-90) 139/60 (05/02 1103) SpO2:  [85 %-100 %] 90 % (05/02 0820) Weight:  [180 lb 1.9 oz (81.7 kg)] 180 lb 1.9 oz (81.7 kg) (05/01 1848) Last BM Date: 01/23/18 General:   Alert,  Well-developed, well-nourished, pleasant and cooperative in NAD Head:  Normocephalic and atraumatic. Eyes:  Sclera clear, no icterus. Conjunctiva pink. Ears:  Normal auditory acuity. Neck:  Supple; no masses or thyromegaly. Lungs:  Bilateral mild expiratory wheezes noted. No crackles or rhonchi. No acute distress. Heart:  Regular rate and rhythm; no murmurs, clicks, rubs,  or gallops. Abdomen:  Soft, nontender and nondistended. No masses, hepatosplenomegaly or hernias noted. Normal bowel sounds, without guarding, and without rebound.   Rectal:  Deferred.   Msk:  Symmetrical without gross deformities. Extremities:  Without clubbing or edema. Neurologic:  Alert and  oriented x4;  grossly normal neurologically. Skin:  Intact without significant lesions or rashes. Psych:  Alert and cooperative. Normal mood and affect.  Intake/Output from previous day: 05/01 0701 - 05/02 0700 In: 315 [Blood:315] Out: 375 [Urine:375] Intake/Output this shift: Total I/O In: 550 [P.O.:550] Out: 850 [Urine:850]  Lab Results: Recent Labs    01/23/18 1052 01/24/18 0450  WBC 5.0 6.2  HGB 7.3* 10.4*  HCT 24.4* 33.1*  PLT 329 301   BMET Recent Labs    01/23/18 1052 01/24/18 0450  NA 140 144  K 2.9* 3.8  CL 103 108  CO2 26 28  GLUCOSE 128* 111*  BUN 18 12  CREATININE 1.71* 1.29*  CALCIUM 7.0* 7.0*   LFT Recent Labs    01/23/18 1052  PROT 6.7  ALBUMIN 3.3*  AST 14*  ALT 12*  ALKPHOS 69  BILITOT 0.5    PT/INR Recent Labs    01/23/18 1109  LABPROT 12.9  INR 0.98   Hepatitis Panel No results for input(s): HEPBSAG, HCVAB, HEPAIGM, HEPBIGM in the last 72 hours. C-Diff No results for input(s): CDIFFTOX in the last 72 hours.  Studies/Results: Dg Chest 2 View  Result Date: 01/23/2018 CLINICAL DATA:  Cough.  Wheezing. EXAM: CHEST - 2 VIEW COMPARISON:  12/17/2017 chest radiograph. FINDINGS: Stable cardiomediastinal silhouette with mild cardiomegaly. No pneumothorax. No pleural effusion. Hyperinflated lungs. There are diffuse irregular parahilar reticular opacities with associated architectural distortion. Calcified pleural plaques again noted bilaterally. Curvilinear bibasilar lung opacities. These findings are not appreciably changed. No superimposed acute consolidative airspace disease. IMPRESSION: 1. No acute consolidative airspace disease. 2. Stable prominent changes of chronic interstitial lung disease as recently detailed on 12/19/2017 high-resolution chest CT study. 3. Calcified bilateral pleural plaques compatible with asbestos related pleural disease. 4. Hyperinflated lungs suggesting COPD. Electronically Signed   By: Ilona Sorrel M.D.   On: 01/23/2018 12:23    Impression: Patient with chronic dark stools on iron previously seen for microcytic anemia in  our office.  History of GI bleed.  Previous EGD found small hiatal hernia, gastric fundal diverticulum.  She was on NSAIDs at that time.  Previous colonoscopy completed with suboptimal prep and declined repeat colonoscopy in our office.  Chronic hemoglobin level in the 9 range.  She presented with decline hemoglobin around 7.3.  Stool was grossly heme positive, still dark in the ER.  Denies any hematochezia.  Denies NSAIDs or aspirin powders.  No overt symptoms of anemia.  She was given packed red blood cells x2 units and her hemoglobin has appropriately risen to 10.4.  She did have a bowel movement today which she said it is was brown.  I  will query upper GI bleed.  Differentials include silent GERD, erosive esophagitis, gastritis, duodenitis, peptic ulcer disease, duodenal ulcer.  Less likely malignant process.  Less likely lower GI bleed given no red blood.  Recent upper endoscopy essentially unremarkable.  Will discuss with Dr. Gala Romney feasibility and utility of proceeding with another upper endoscopy during hospitalization.  Alternatively, we could monitor her labs for any further decline and proceed with endoscopic evaluation at that time.  We could also plan for outpatient evaluation if she remains stable in hospital.  Plan: 1. Monitor H/H closely 2. Monitor for recurrent GI bleed 3. Transfuse as necessary 4. Will discuss with Dr. Gala Romney about possibility of endoscopic evaluation IP vs OP 5. Supportive measures 6. If no plan for EGD tomorrow, can advance diet later today (patient requesting)   Thank you for allowing [place] to participate in the care of Dayami S Digilio  Skai Lickteig, DNP, AGNP-C Adult & Gerontological Nurse Practitioner [facility]    LOS: 1 day     01/24/2018, 1:03 PM

## 2018-01-24 NOTE — Progress Notes (Signed)
PROGRESS NOTE    Erica [name]  SAY:[id number] DOB: [birth year] DOA: 01/23/2018 PCP: Iona Beard, MD     Brief Narrative:  67 year old woman admitted from home on 5/1D due to rectal bleeding. Her hemoglobin had dropped to 7.3 she received 2 units of PRBCs in the ED.  Admission was requested.   Assessment & Plan:   Principal Problem:   GIB (gastrointestinal bleeding) Active Problems:   Chronic pain syndrome   Diabetes mellitus type 2, controlled (HCC)   Chronic respiratory failure (HCC)   Benign essential HTN   (HFpEF) heart failure with preserved ejection fraction (HCC)   Heme positive stool   Iron deficiency anemia due to chronic blood loss   CKD (chronic kidney disease)   Melena -Seen by GI, plans for EGD plus minus capsule endoscopy on 5/3. -No further bleeding since admission. -Continue PPI/H2 blocker  Acute blood loss anemia -Adequate hemoglobin response to 2 units of PRBCs, hemoglobin remained stable, no plans for further transfusion unless hemoglobin were to drop below 7. -Due to ongoing GI bleeding.  Type 2 diabetes -Fair control, continue current management.  Chronic diastolic heart failure -Compensated. -Echo from March 2019 with ejection fraction of 60 to 65% and normal wall motion.   DVT prophylaxis: SCDs Code Status: Full code Family Communication: Patient only Disposition Plan:  pending results of upper endoscopy  Consultants:   GI  Procedures:   None  Antimicrobials:  Anti-infectives (From admission, onward)   None       Subjective: Lying in bed, no active complaints, denies shortness of breath or chest pain, has not noticed further rectal bleeding today.  Objective: Vitals:   01/24/18 0820 01/24/18 1102 01/24/18 1103 01/24/18 1441  BP:  139/60 139/60 (!) 141/65  Pulse:  74  70  Resp:    19  Temp:    98.7 F (37.1 C)  TempSrc:    Oral  SpO2: 90%   91%  Weight:      Height:        Intake/Output Summary (Last 24 hours) at  01/24/2018 1739 Last data filed at 01/24/2018 1500 Gross per 24 hour  Intake 1615 ml  Output 1950 ml  Net -335 ml   Filed Weights   01/23/18 1012 01/23/18 1848  Weight: 79.8 kg (176 lb) 81.7 kg (180 lb 1.9 oz)    Examination:  General exam: Alert, awake, oriented x 3 Respiratory system: Clear to auscultation. Respiratory effort normal. Cardiovascular system:RRR. No murmurs, rubs, gallops. Gastrointestinal system: Abdomen is nondistended, soft and nontender. No organomegaly or masses felt. Normal bowel sounds heard. Central nervous system: Alert and oriented. No focal neurological deficits. Extremities: No C/C/E, +pedal pulses Skin: No rashes, lesions or ulcers Psychiatry: Judgement and insight appear normal. Mood & affect appropriate.     Data Reviewed: I have personally reviewed following labs and imaging studies  CBC: Recent Labs  Lab 01/23/18 1052 01/24/18 0450  WBC 5.0 6.2  HGB 7.3* 10.4*  HCT 24.4* 33.1*  MCV 83.6 84.4  PLT 329 235   Basic Metabolic Panel: Recent Labs  Lab 01/23/18 1052 01/24/18 0450  NA 140 144  K 2.9* 3.8  CL 103 108  CO2 26 28  GLUCOSE 128* 111*  BUN 18 12  CREATININE 1.71* 1.29*  CALCIUM 7.0* 7.0*   GFR: Estimated Creatinine Clearance: 43.2 mL/min (A) (by C-G formula based on SCr of 1.29 mg/dL (H)). Liver Function Tests: Recent Labs  Lab 01/23/18 1052  AST 14*  ALT 12*  ALKPHOS 69  BILITOT 0.5  PROT 6.7  ALBUMIN 3.3*   No results for input(s): LIPASE, AMYLASE in the last 168 hours. No results for input(s): AMMONIA in the last 168 hours. Coagulation Profile: Recent Labs  Lab 01/23/18 1109  INR 0.98   Cardiac Enzymes: No results for input(s): CKTOTAL, CKMB, CKMBINDEX, TROPONINI in the last 168 hours. BNP (last 3 results) No results for input(s): PROBNP in the last 8760 hours. HbA1C: No results for input(s): HGBA1C in the last 72 hours. CBG: No results for input(s): GLUCAP in the last 168 hours. Lipid Profile: No  results for input(s): CHOL, HDL, LDLCALC, TRIG, CHOLHDL, LDLDIRECT in the last 72 hours. Thyroid Function Tests: No results for input(s): TSH, T4TOTAL, FREET4, T3FREE, THYROIDAB in the last 72 hours. Anemia Panel: No results for input(s): VITAMINB12, FOLATE, FERRITIN, TIBC, IRON, RETICCTPCT in the last 72 hours. Urine analysis:    Component Value Date/Time   COLORURINE YELLOW 03/14/2013 1847   APPEARANCEUR CLEAR 03/14/2013 1847   LABSPEC 1.025 03/14/2013 1847   PHURINE 6.0 03/14/2013 1847   GLUCOSEU NEGATIVE 03/14/2013 1847   HGBUR NEGATIVE 03/14/2013 1847   BILIRUBINUR NEGATIVE 03/14/2013 1847   KETONESUR NEGATIVE 03/14/2013 1847   PROTEINUR NEGATIVE 03/14/2013 1847   UROBILINOGEN 0.2 03/14/2013 1847   NITRITE NEGATIVE 03/14/2013 1847   LEUKOCYTESUR NEGATIVE 03/14/2013 1847   Sepsis Labs: @LABRCNTIP (procalcitonin:4,lacticidven:4)  )No results found for this or any previous visit (from the past 240 hour(s)).       Radiology Studies: Dg Chest 2 View  Result Date: 01/23/2018 CLINICAL DATA:  Cough.  Wheezing. EXAM: CHEST - 2 VIEW COMPARISON:  12/17/2017 chest radiograph. FINDINGS: Stable cardiomediastinal silhouette with mild cardiomegaly. No pneumothorax. No pleural effusion. Hyperinflated lungs. There are diffuse irregular parahilar reticular opacities with associated architectural distortion. Calcified pleural plaques again noted bilaterally. Curvilinear bibasilar lung opacities. These findings are not appreciably changed. No superimposed acute consolidative airspace disease. IMPRESSION: 1. No acute consolidative airspace disease. 2. Stable prominent changes of chronic interstitial lung disease as recently detailed on 12/19/2017 high-resolution chest CT study. 3. Calcified bilateral pleural plaques compatible with asbestos related pleural disease. 4. Hyperinflated lungs suggesting COPD. Electronically Signed   By: Ilona Sorrel M.D.   On: 01/23/2018 12:23        Scheduled  Meds: . amLODipine  10 mg Oral Daily  . atorvastatin  80 mg Oral q1800  . buPROPion  150 mg Oral BID  . famotidine  20 mg Oral Daily  . furosemide  40 mg Oral Daily  . irbesartan  150 mg Oral Daily   And  . hydrochlorothiazide  25 mg Oral Daily  . ipratropium-albuterol  3 mL Nebulization BID  . linaclotide  145 mcg Oral Daily  . metoprolol tartrate  12.5 mg Oral BID  . mometasone-formoterol  2 puff Inhalation BID  . omega-3 acid ethyl esters  2 g Oral BID  . pantoprazole  40 mg Oral Daily  . sodium chloride flush  3 mL Intravenous Q12H   Continuous Infusions: . sodium chloride       LOS: 1 day    Time spent: 25 minutes.     Lelon Frohlich, MD [facility] Pager [phone]  If 7PM-7AM, please contact night-coverage [email or web address] Password [facility] 01/24/2018, 5:39 PM

## 2018-01-25 ENCOUNTER — Encounter (HOSPITAL_COMMUNITY)
Admission: EM | Disposition: A | Discharge status: Home / Self Care | Payer: Self-pay | Source: Home / Self Care | Attending: Internal Medicine

## 2018-01-25 ENCOUNTER — Other Ambulatory Visit: Payer: Self-pay

## 2018-01-25 ENCOUNTER — Encounter (HOSPITAL_COMMUNITY): Payer: Self-pay

## 2018-01-25 DIAGNOSIS — K921 Melena: Secondary | ICD-10-CM

## 2018-01-25 DIAGNOSIS — K31819 Angiodysplasia of stomach and duodenum without bleeding: Secondary | ICD-10-CM

## 2018-01-25 DIAGNOSIS — K264 Chronic or unspecified duodenal ulcer with hemorrhage: Secondary | ICD-10-CM

## 2018-01-25 HISTORY — PX: GIVENS CAPSULE STUDY: SHX5432

## 2018-01-25 HISTORY — PX: ESOPHAGOGASTRODUODENOSCOPY: SHX5428

## 2018-01-25 LAB — BPAM RBC
BLOOD PRODUCT EXPIRATION DATE: 201905222359
Blood Product Expiration Date: 201905222359
ISSUE DATE / TIME: 201905020008
ISSUE DATE / TIME: 201905011541
UNIT TYPE AND RH: 7300
Unit Type and Rh: 7300

## 2018-01-25 LAB — TYPE AND SCREEN
ABO/RH(D): B POS
Antibody Screen: NEGATIVE
UNIT DIVISION: 0
UNIT DIVISION: 0

## 2018-01-25 SURGERY — EGD (ESOPHAGOGASTRODUODENOSCOPY)
Anesthesia: Moderate Sedation

## 2018-01-25 MED ORDER — PANTOPRAZOLE SODIUM 40 MG PO TBEC: 40.0000 mg | DELAYED_RELEASE_TABLET | Freq: Every day | ORAL | 2 refills | Status: DC

## 2018-01-25 MED ORDER — MIDAZOLAM HCL 5 MG/5ML IJ SOLN
INTRAMUSCULAR | Status: AC
  Filled 2018-01-25: qty 10

## 2018-01-25 MED ORDER — PROMETHAZINE HCL 25 MG/ML IJ SOLN
INTRAMUSCULAR | Status: DC | PRN
  Administered 2018-01-25: 12.5 mg via INTRAVENOUS

## 2018-01-25 MED ORDER — STERILE WATER FOR IRRIGATION IR SOLN
Status: DC | PRN
  Administered 2018-01-25: 15 mL

## 2018-01-25 MED ORDER — PROMETHAZINE HCL 25 MG/ML IJ SOLN
INTRAMUSCULAR | Status: AC
  Filled 2018-01-25: qty 1

## 2018-01-25 MED ORDER — MEPERIDINE HCL 100 MG/ML IJ SOLN
INTRAMUSCULAR | Status: DC | PRN
  Administered 2018-01-25: 50 mg via INTRAVENOUS

## 2018-01-25 MED ORDER — FERROUS SULFATE 325 (65 FE) MG PO TBEC: 325.0000 mg | DELAYED_RELEASE_TABLET | Freq: Two times a day (BID) | ORAL | 2 refills | Status: AC

## 2018-01-25 MED ORDER — LIDOCAINE VISCOUS 2 % MT SOLN
OROMUCOSAL | Status: DC | PRN
  Administered 2018-01-25: 1 via OROMUCOSAL

## 2018-01-25 MED ORDER — MIDAZOLAM HCL 5 MG/5ML IJ SOLN
INTRAMUSCULAR | Status: DC | PRN
  Administered 2018-01-25: 2 mg via INTRAVENOUS
  Administered 2018-01-25: 1 mg via INTRAVENOUS

## 2018-01-25 MED ORDER — MEPERIDINE HCL 100 MG/ML IJ SOLN
INTRAMUSCULAR | Status: AC
  Filled 2018-01-25: qty 2

## 2018-01-25 MED ORDER — LIDOCAINE VISCOUS 2 % MT SOLN
OROMUCOSAL | Status: AC
  Filled 2018-01-25: qty 15

## 2018-01-25 MED ORDER — SODIUM CHLORIDE 0.9% FLUSH
INTRAVENOUS | Status: AC
  Filled 2018-01-25: qty 10

## 2018-01-25 NOTE — Care Management Important Message (Signed)
Important Message  Patient Details  Name: IWALANI TEMPLETON MRN: [id number] Date of Birth: 11-13-1950   Medicare Important Message Given:  Yes    Shelda Altes 01/25/2018, 2:07 PM

## 2018-01-25 NOTE — Op Note (Addendum)
[facility] Patient Name: Erica [name] Procedure Date: 01/25/2018 9:04 AM MRN: [id number] Date of Birth: 1951/06/06 Attending MD: Barney Drain MD, MD CSN: [id number] Age: 67 Admit Type: Inpatient Procedure:                Upper GI endoscopy, DIAGNOSTIC Indications:              Heme positive stool, Melena Providers:                Barney Drain MD, MD, Charlsie Quest. Theda Sers RN, RN,                            Aram Candela Referring MD:             Barrie Folk. Hill MD, MD Medicines:                Promethazine 12.5 mg IV, Meperidine 50 mg IV,                            Midazolam 3 mg IV Complications:            No immediate complications. GIVENS CAPSULE PAIRED                            PRIOR TO RELEASE INTO THE SMALL BOWEL. HOWEVER                            CAPSULE FAILED TO PAIR AFTER RELEASE. Estimated Blood Loss:     Estimated blood loss: none. Procedure:                Pre-Anesthesia Assessment:                           - Prior to the procedure, a History and Physical                            was performed, and patient medications and                            allergies were reviewed. The patient's tolerance of                            previous anesthesia was also reviewed. The risks                            and benefits of the procedure and the sedation                            options and risks were discussed with the patient.                            All questions were answered, and informed consent                            was obtained. Prior Anticoagulants: The patient has  taken Plavix (clopidogrel), last dose was 3 days                            prior to procedure. ASA Grade Assessment: III - A                            patient with severe systemic disease. After                            reviewing the risks and benefits, the patient was                            deemed in satisfactory condition to undergo the          procedure. After obtaining informed consent, the                            endoscope was passed under direct vision.                            Throughout the procedure, the patient's blood                            pressure, pulse, and oxygen saturations were                            monitored continuously. The EG29-[id number] ([id number])                            scope was introduced through the mouth, and                            advanced to the second part of duodenum. The upper                            GI endoscopy was somewhat difficult due to the                            patient's body habitus. The patient tolerated the                            procedure fairly well. Scope In: 9:36:25 AM Scope Out: 3:15:17 AM Total Procedure Duration: 0 hours 10 minutes 17 seconds  Findings:      The examined esophagus was normal.      The entire examined stomach was normal.      Two small angioectasias without bleeding were found in the duodenal bulb       and in the first portion of the duodenum. GIVENS CAPSULE RELEASED IN       SMALL BOWEL. Impression:               - Normal esophagus.                           - Normal stomach.                           -  ANEMIA MOST LIKELY DUE TO AVMS. Moderate Sedation:      Moderate (conscious) sedation was administered by the endoscopy nurse       and supervised by the endoscopist. The following parameters were       monitored: oxygen saturation, heart rate, blood pressure, and response       to care. Total physician intraservice time was 23 minutes. Recommendation:           - Cardiac diet. CONSIDER BENEFITS V. RISKS OF DUAL                            PLATELET THERAPY. MAY RE-START ASA/PLAVIX ON MAY 4.                           - Continue present medications. PROTONIX DAILY.                            BIFERA BID ON DISCHARGE.                           - RETURN FOR EGD/GIVENS CAPSULE PLACEMENT IN 2                            WEEKS IF NEEDED                            - Return patient to hospital ward for ongoing care. Procedure Code(s):        --- Professional ---                           [id number], Esophagogastroduodenoscopy, flexible,                            transoral; diagnostic, including collection of                            specimen(s) by brushing or washing, when performed                            (separate procedure)                           G0500, Moderate sedation services provided by the                            same physician or other qualified health care                            professional performing a gastrointestinal                            endoscopic service that sedation supports,                            requiring the presence of an independent trained  observer to assist in the monitoring of the                            patient's level of consciousness and physiological                            status; initial 15 minutes of intra-service time;                            patient age 7 years or older (additional time may                            be reported with [phone], as appropriate)                           [phone], Moderate sedation services provided by the                            same physician or other qualified health care                            professional performing the diagnostic or                            therapeutic service that the sedation supports,                            requiring the presence of an independent trained                            observer to assist in the monitoring of the                            patient's level of consciousness and physiological                            status; each additional 15 minutes intraservice                            time (List separately in addition to code for                            primary service) Diagnosis Code(s):        --- Professional ---                           C94.709,  Angiodysplasia of stomach and duodenum                            without bleeding                           R19.5, Other fecal abnormalities  K92.1, Melena (includes Hematochezia) CPT copyright 2017 [employer]. All rights reserved. The codes documented in this report are preliminary and upon coder review may  be revised to meet current compliance requirements. Barney Drain, MD Barney Drain MD, MD 01/25/2018 10:02:32 AM This report has been signed electronically. Number of Addenda: 0

## 2018-01-25 NOTE — Discharge Summary (Signed)
Physician Discharge Summary  Erica [name] WLS:[id number] DOB: [birth year] DOA: 01/23/2018  PCP: Iona Beard, MD  Admit date: 01/23/2018 Discharge date: 01/25/2018  Time spent: 45 minutes  Recommendations for Outpatient Follow-up:  -Will be discharged home today. -Advised to follow up with PCP in 2 weeks.   Discharge Diagnoses:  Principal Problem:   GIB (gastrointestinal bleeding) Active Problems:   Chronic pain syndrome   Diabetes mellitus type 2, controlled (HCC)   Chronic respiratory failure (HCC)   Benign essential HTN   (HFpEF) heart failure with preserved ejection fraction (HCC)   Heme positive stool   Iron deficiency anemia due to chronic blood loss   CKD (chronic kidney disease)   AVM (arteriovenous malformation) of duodenum, acquired   Discharge Condition: Stable and improved  Filed Weights   01/23/18 1012 01/23/18 1848 01/25/18 0555  Weight: 79.8 kg (176 lb) 81.7 kg (180 lb 1.9 oz) 83.9 kg (185 lb)    History of present illness:  As per Dr. Shanon Brow on 5/1: Erica [name] is a 67 y.o. female with medical history significant of chronic GI bleed, congestive heart failure, COPD which see her primary care physician yesterday and had routine labs done which showed a low hemoglobin of 7.3.  Her last hemoglobin several months ago was over 9.  She was called and told to come the emergency department for evaluation.  Patient denies any chest pain or shortness of breath.  She says her stools are always dark because she takes iron pills.  She denies any bright red blood per rectum.  She denies any nausea vomiting or abdominal pain.  She had a colonoscopy the beginning of this year by her GI doctor Dr. Melony Overly who did not find any reason for her GI bleed.  Patient has heme positive stools here in the emergency department.  GI has been called who will be seeing the patient tomorrow.  She denies any fevers.  She denies any weakness.  Patient referred for admission for acute on chronic  anemia likely secondary to chronic GI blood losses requiring blood transfusion at this time.     Hospital Course:   Melena -Seen by GI. EGD on 5/3: with normal esophagus and stomach. GIB presumed due to AVMs. -No further bleeding since admission. -Per GI, ok to DC home today with protonix daily and iron supplementation.  Acute blood loss anemia -Adequate hemoglobin response to 2 units of PRBCs, hemoglobin has remained stable around 10.4, no plans for further transfusion unless hemoglobin were to drop below 7. -Due to ongoing GI bleeding.  Type 2 diabetes -Fair control, continue current management.  Chronic diastolic heart failure -Compensated. -Echo from March 2019 with ejection fraction of 60 to 65% and normal wall motion.    Procedures:  EGD on 5/3 as above   Consultations:  GI  Discharge Instructions  Discharge Instructions    Diet - low sodium heart healthy   Complete by:  As directed    Increase activity slowly   Complete by:  As directed      Allergies as of 01/25/2018   No Known Allergies     Medication List    STOP taking these medications   aspirin 81 MG tablet   clopidogrel 75 MG tablet Commonly known as:  PLAVIX   famotidine 20 MG tablet Commonly known as:  PEPCID   omeprazole 20 MG capsule Commonly known as:  PRILOSEC Replaced by:  pantoprazole 40 MG tablet     TAKE these  medications   amLODipine 10 MG tablet Commonly known as:  NORVASC Take 1 tablet by mouth daily.   atorvastatin 80 MG tablet Commonly known as:  LIPITOR Take 80 mg by mouth daily.   budesonide-formoterol 160-4.5 MCG/ACT inhaler Commonly known as:  SYMBICORT Inhale 2 puffs into the lungs 2 (two) times daily.   buPROPion 150 MG 12 hr tablet Commonly known as:  WELLBUTRIN SR Take 150 mg by mouth 2 (two) times daily.   ferrous sulfate 325 (65 FE) MG EC tablet Take 1 tablet (325 mg total) by mouth 2 (two) times daily.   furosemide 40 MG tablet Commonly known  as:  LASIX Take 1 tablet (40 mg total) by mouth daily.   ipratropium-albuterol 0.5-2.5 (3) MG/3ML Soln Commonly known as:  DUONEB Take 3 mLs by nebulization every 6 (six) hours as needed (for shortness of breath).   LINZESS 145 MCG Caps capsule Generic drug:  linaclotide Take 145 mcg by mouth daily.   metoprolol tartrate 25 MG tablet Commonly known as:  LOPRESSOR Take 0.5 tablets (12.5 mg total) by mouth 2 (two) times daily.   omega-3 acid ethyl esters 1 g capsule Commonly known as:  LOVAZA Take 2 g by mouth 2 (two) times daily.   ONGLYZA 5 MG Tabs tablet Generic drug:  saxagliptin HCl Take 5 mg by mouth daily. Reported on 09/22/2015   OXYGEN Inhale 2 L into the lungs continuous.   pantoprazole 40 MG tablet Commonly known as:  PROTONIX Take 1 tablet (40 mg total) by mouth daily. Replaces:  omeprazole 20 MG capsule   SPIRIVA HANDIHALER 18 MCG inhalation capsule Generic drug:  tiotropium Take 1 puff by mouth daily.   valsartan-hydrochlorothiazide 160-25 MG tablet Commonly known as:  DIOVAN-HCT Take 1 tablet by mouth daily.      No Known Allergies Follow-up Information    Iona Beard, MD. Schedule an appointment as soon as possible for a visit in 2 week(s).   Specialty:  Family Medicine Contact information: [address] [place] [address] [phone]        Arnoldo Lenis, MD .   Specialty:  Cardiology Contact information: [address] [place] [address] [phone]            The results of significant diagnostics from this hospitalization (including imaging, microbiology, ancillary and laboratory) are listed below for reference.    Significant Diagnostic Studies: Dg Chest 2 View  Result Date: 01/23/2018 CLINICAL DATA:  Cough.  Wheezing. EXAM: CHEST - 2 VIEW COMPARISON:  12/17/2017 chest radiograph. FINDINGS: Stable cardiomediastinal silhouette with mild cardiomegaly. No pneumothorax. No pleural effusion. Hyperinflated lungs.  There are diffuse irregular parahilar reticular opacities with associated architectural distortion. Calcified pleural plaques again noted bilaterally. Curvilinear bibasilar lung opacities. These findings are not appreciably changed. No superimposed acute consolidative airspace disease. IMPRESSION: 1. No acute consolidative airspace disease. 2. Stable prominent changes of chronic interstitial lung disease as recently detailed on 12/19/2017 high-resolution chest CT study. 3. Calcified bilateral pleural plaques compatible with asbestos related pleural disease. 4. Hyperinflated lungs suggesting COPD. Electronically Signed   By: Ilona Sorrel M.D.   On: 01/23/2018 12:23    Microbiology: No results found for this or any previous visit (from the past 240 hour(s)).   Labs: Basic Metabolic Panel: Recent Labs  Lab 01/23/18 1052 01/24/18 0450  NA 140 144  K 2.9* 3.8  CL 103 108  CO2 26 28  GLUCOSE 128* 111*  BUN 18 12  CREATININE  1.71* 1.29*  CALCIUM 7.0* 7.0*   Liver Function Tests: Recent Labs  Lab 01/23/18 1052  AST 14*  ALT 12*  ALKPHOS 69  BILITOT 0.5  PROT 6.7  ALBUMIN 3.3*   No results for input(s): LIPASE, AMYLASE in the last 168 hours. No results for input(s): AMMONIA in the last 168 hours. CBC: Recent Labs  Lab 01/23/18 1052 01/24/18 0450  WBC 5.0 6.2  HGB 7.3* 10.4*  HCT 24.4* 33.1*  MCV 83.6 84.4  PLT 329 301   Cardiac Enzymes: No results for input(s): CKTOTAL, CKMB, CKMBINDEX, TROPONINI in the last 168 hours. BNP: BNP (last 3 results) Recent Labs    09/19/17 1348 12/07/17 1218 12/17/17 1738  BNP 244.0* 155.0* 583.0*    ProBNP (last 3 results) No results for input(s): PROBNP in the last 8760 hours.  CBG: No results for input(s): GLUCAP in the last 168 hours.     Signed:  Lelon Frohlich  [facility] Pager: [phone] 01/25/2018, 3:45 PM

## 2018-01-25 NOTE — Discharge Instructions (Signed)
Please resume aspirin and plavix on Saturday May 4th.

## 2018-01-25 NOTE — H&P (Signed)
\ Primary Care Physician:  Iona Beard, MD Primary Gastroenterologist:  Dr. Oneida Alar  Pre-Procedure History & Physical: HPI:  Erica [name] is a 67 y.o. female here for Anemia/heme pos stools.   Past Medical History:  Diagnosis Date  . Arthritis   . CHF (congestive heart failure) ([place])   . Chronic back pain   . COPD (chronic obstructive pulmonary disease) (HCC)    2L home O2  . Diabetes mellitus without complication ([place])   . Hyperlipidemia   . Hypertension   . Stroke (cerebrum) [facility])     Past Surgical History:  Procedure Laterality Date  . ABDOMINAL HYSTERECTOMY    . colonoscopy  2009   Dr. Gala Romney: cluster of diminutive rectal polyps and 2 diminutive polyps in rectosigmoid junction, hyperplastic  . COLONOSCOPY WITH PROPOFOL N/A 10/18/2017   Procedure: COLONOSCOPY WITH PROPOFOL;  Surgeon: Daneil Dolin, MD;  Location: [facility];  Service: Endoscopy;  Laterality: N/A;  9:30am  . ESOPHAGOGASTRODUODENOSCOPY (EGD) WITH PROPOFOL N/A 08/29/2017   Dr. Gala Romney with propofol: small hiatal hernia. 4cm fundal diverticulum.   Marland Kitchen left hand middle finger     in an accident at work, amputated    Prior to Admission medications   Medication Sig Start Date End Date Taking? Authorizing Provider  amLODipine (NORVASC) 10 MG tablet Take 1 tablet by mouth daily. 12/27/17  Yes [provider]  aspirin 81 MG tablet Take 2 tablets by mouth daily.  10/22/15  Yes [provider]  atorvastatin (LIPITOR) 80 MG tablet Take 80 mg by mouth daily.  12/04/16  Yes [provider]  budesonide-formoterol (SYMBICORT) 160-4.5 MCG/ACT inhaler Inhale 2 puffs into the lungs 2 (two) times daily. 11/23/15  Yes Barton Dubois, MD  buPROPion [facility] SR) 150 MG 12 hr tablet Take 150 mg by mouth 2 (two) times daily. 12/04/16  Yes [provider]  clopidogrel (PLAVIX) 75 MG tablet Take 75 mg by mouth daily.   Yes [provider]  famotidine (PEPCID) 20 MG tablet Take 20 mg by  mouth 2 (two) times daily.   Yes [provider]  furosemide (LASIX) 40 MG tablet Take 1 tablet (40 mg total) by mouth daily. 12/23/17  Yes Dhungel, Nishant, MD  ipratropium-albuterol (DUONEB) 0.5-2.5 (3) MG/3ML SOLN Take 3 mLs by nebulization every 6 (six) hours as needed (for shortness of breath).   Yes [provider]  Linaclotide (LINZESS) 145 MCG CAPS capsule Take 145 mcg by mouth daily.   Yes [provider]  metoprolol tartrate (LOPRESSOR) 25 MG tablet Take 0.5 tablets (12.5 mg total) by mouth 2 (two) times daily. 08/30/17  Yes Tat, Shanon Brow, MD  omega-3 acid ethyl esters (LOVAZA) 1 g capsule Take 2 g by mouth 2 (two) times daily.  12/07/16  Yes [provider]  omeprazole (PRILOSEC) 20 MG capsule Take 20 mg by mouth daily. 12/04/16  Yes [provider]  OXYGEN Inhale 2 L into the lungs continuous.   Yes [provider]  saxagliptin HCl (ONGLYZA) 5 MG TABS tablet Take 5 mg by mouth daily. Reported on 09/22/2015   Yes [provider]  SPIRIVA HANDIHALER 18 MCG inhalation capsule Take 1 puff by mouth daily. 10/31/17  Yes [provider]  valsartan-hydrochlorothiazide (DIOVAN-HCT) 160-25 MG tablet Take 1 tablet by mouth daily. 12/27/17   [provider]    Allergies as of 01/23/2018  . (No Known Allergies)    Family History  Problem Relation Age of Onset  . CVA Mother 56  .  Cancer Neg Hx   . Colon cancer Neg Hx     Social History   Socioeconomic History  . Marital status: Divorced    Spouse name: Not on file  . Number of children: Not on file  . Years of education: Not on file  . Highest education level: Not on file  Occupational History  . Occupation: retired  Scientific laboratory technician  . Financial resource strain: Not on file  . Food insecurity:    Worry: Not on file    Inability: Not on file  . Transportation needs:    Medical: Not on file    Non-medical: Not on file  Tobacco Use  . Smoking status: Former  Smoker    Packs/day: 1.00    Years: 52.00    Pack years: 52.00    Types: Cigarettes    Last attempt to quit: 09/23/2017    Years since quitting: 0.3  . Smokeless tobacco: Never Used  Substance and Sexual Activity  . Alcohol use: No  . Drug use: No  . Sexual activity: Not Currently  Lifestyle  . Physical activity:    Days per week: Not on file    Minutes per session: Not on file  . Stress: Not on file  Relationships  . Social connections:    Talks on phone: Not on file    Gets together: Not on file    Attends religious service: Not on file    Active member of club or organization: Not on file    Attends meetings of clubs or organizations: Not on file    Relationship status: Not on file  . Intimate partner violence:    Fear of current or ex partner: Not on file    Emotionally abused: Not on file    Physically abused: Not on file    Forced sexual activity: Not on file  Other Topics Concern  . Not on file  Social History Narrative   ** Merged History Encounter **        Review of Systems: See HPI, otherwise negative ROS   Physical Exam: BP (!) 137/55   Pulse 70   Temp 98.8 F (37.1 C) (Oral)   Resp (!) 26   Ht 5\' 4"  (1.626 m)   Wt 185 lb (83.9 kg)   SpO2 92%   BMI 31.76 kg/m  General:   Alert,  pleasant and cooperative in NAD Head:  Normocephalic and atraumatic. Neck:  Supple; Lungs:  Clear throughout to auscultation.    Heart:  Regular rate and rhythm. Abdomen:  Soft, nontender and nondistended. Normal bowel sounds, without guarding, and without rebound.   Neurologic:  Alert and  oriented x4;  grossly normal neurologically.  Impression/Plan:     Anemia/heme pos stools  PLAN:  1. EGD for givens capsule placement TODAY. DISCUSSED PROCEDURE, BENEFITS, & RISKS: < 1% chance of medication reaction, PERFORATION, CAPSULE RETENTION REQUIRING SURGERY TO RETRIEVE IT, OR bleeding.

## 2018-01-25 NOTE — Progress Notes (Signed)
PROGRESS NOTE    Erica [name]  ZOX:[id number] DOB: 1951-06-11 DOA: 01/23/2018 PCP: Iona Beard, MD     Brief Narrative:  67 year old woman admitted from home on 5/1D due to rectal bleeding. Her hemoglobin had dropped to 7.3 she received 2 units of PRBCs in the ED.  Admission was requested.   Assessment & Plan:   Principal Problem:   GIB (gastrointestinal bleeding) Active Problems:   Chronic pain syndrome   Diabetes mellitus type 2, controlled (HCC)   Chronic respiratory failure (HCC)   Benign essential HTN   (HFpEF) heart failure with preserved ejection fraction (HCC)   Heme positive stool   Iron deficiency anemia due to chronic blood loss   CKD (chronic kidney disease)   AVM (arteriovenous malformation) of duodenum, acquired   Melena -Seen by GI, for EGD today, further plan pending results. -No further bleeding since admission. -Continue PPI/H2 blocker  Acute blood loss anemia -Adequate hemoglobin response to 2 units of PRBCs, hemoglobin has remained stable around 10.4, no plans for further transfusion unless hemoglobin were to drop below 7. -Due to ongoing GI bleeding.  Type 2 diabetes -Fair control, continue current management.  Chronic diastolic heart failure -Compensated. -Echo from March 2019 with ejection fraction of 60 to 65% and normal wall motion.   DVT prophylaxis: SCDs Code Status: Full code Family Communication: Patient only Disposition Plan:  pending results of upper endoscopy  Consultants:   GI  Procedures:   EGD on 5/3  Antimicrobials:  Anti-infectives (From admission, onward)   None       Subjective: In bed, no complaints, denies chest pain or shortness of breath, has not noticed rectal bleeding since admission.  Objective: Vitals:   01/25/18 0940 01/25/18 0945 01/25/18 1312 01/25/18 1416  BP: (!) 152/72 130/76  (!) 143/44  Pulse: 82 70  65  Resp: (!) 21 20  20   Temp:    98.4 F (36.9 C)  TempSrc:      SpO2: 99% 99% 95%  92%  Weight:      Height:        Intake/Output Summary (Last 24 hours) at 01/25/2018 1433 Last data filed at 01/25/2018 0500 Gross per 24 hour  Intake 840 ml  Output 1200 ml  Net -360 ml   Filed Weights   01/23/18 1012 01/23/18 1848 01/25/18 0555  Weight: 79.8 kg (176 lb) 81.7 kg (180 lb 1.9 oz) 83.9 kg (185 lb)    Examination:  General exam: Alert, awake, oriented x 3 Respiratory system: Clear to auscultation. Respiratory effort normal. Cardiovascular system:RRR. No murmurs, rubs, gallops. Gastrointestinal system: Abdomen is nondistended, soft and nontender. No organomegaly or masses felt. Normal bowel sounds heard. Central nervous system: Alert and oriented. No focal neurological deficits. Extremities: No C/C/E, +pedal pulses Skin: No rashes, lesions or ulcers Psychiatry: Judgement and insight appear normal. Mood & affect appropriate.      Data Reviewed: I have personally reviewed following labs and imaging studies  CBC: Recent Labs  Lab 01/23/18 1052 01/24/18 0450  WBC 5.0 6.2  HGB 7.3* 10.4*  HCT 24.4* 33.1*  MCV 83.6 84.4  PLT 329 811   Basic Metabolic Panel: Recent Labs  Lab 01/23/18 1052 01/24/18 0450  NA 140 144  K 2.9* 3.8  CL 103 108  CO2 26 28  GLUCOSE 128* 111*  BUN 18 12  CREATININE 1.71* 1.29*  CALCIUM 7.0* 7.0*   GFR: Estimated Creatinine Clearance: 43.8 mL/min (A) (by C-G formula based on SCr of 1.29  mg/dL (H)). Liver Function Tests: Recent Labs  Lab 01/23/18 1052  AST 14*  ALT 12*  ALKPHOS 69  BILITOT 0.5  PROT 6.7  ALBUMIN 3.3*   No results for input(s): LIPASE, AMYLASE in the last 168 hours. No results for input(s): AMMONIA in the last 168 hours. Coagulation Profile: Recent Labs  Lab 01/23/18 1109  INR 0.98   Cardiac Enzymes: No results for input(s): CKTOTAL, CKMB, CKMBINDEX, TROPONINI in the last 168 hours. BNP (last 3 results) No results for input(s): PROBNP in the last 8760 hours. HbA1C: No results for input(s):  HGBA1C in the last 72 hours. CBG: No results for input(s): GLUCAP in the last 168 hours. Lipid Profile: No results for input(s): CHOL, HDL, LDLCALC, TRIG, CHOLHDL, LDLDIRECT in the last 72 hours. Thyroid Function Tests: No results for input(s): TSH, T4TOTAL, FREET4, T3FREE, THYROIDAB in the last 72 hours. Anemia Panel: No results for input(s): VITAMINB12, FOLATE, FERRITIN, TIBC, IRON, RETICCTPCT in the last 72 hours. Urine analysis:    Component Value Date/Time   COLORURINE YELLOW 03/14/2013 1847   APPEARANCEUR CLEAR 03/14/2013 1847   LABSPEC 1.025 03/14/2013 1847   PHURINE 6.0 03/14/2013 1847   GLUCOSEU NEGATIVE 03/14/2013 1847   HGBUR NEGATIVE 03/14/2013 1847   BILIRUBINUR NEGATIVE 03/14/2013 1847   KETONESUR NEGATIVE 03/14/2013 1847   PROTEINUR NEGATIVE 03/14/2013 1847   UROBILINOGEN 0.2 03/14/2013 1847   NITRITE NEGATIVE 03/14/2013 1847   LEUKOCYTESUR NEGATIVE 03/14/2013 1847   Sepsis Labs: @LABRCNTIP (procalcitonin:4,lacticidven:4)  )No results found for this or any previous visit (from the past 240 hour(s)).       Radiology Studies: No results found.      Scheduled Meds: . amLODipine  10 mg Oral Daily  . atorvastatin  80 mg Oral q1800  . buPROPion  150 mg Oral BID  . famotidine  20 mg Oral Daily  . furosemide  40 mg Oral Daily  . irbesartan  150 mg Oral Daily   And  . hydrochlorothiazide  25 mg Oral Daily  . ipratropium-albuterol  3 mL Nebulization BID  . lidocaine      . linaclotide  145 mcg Oral Daily  . meperidine      . metoprolol tartrate  12.5 mg Oral BID  . midazolam      . mometasone-formoterol  2 puff Inhalation BID  . omega-3 acid ethyl esters  2 g Oral BID  . pantoprazole  40 mg Oral Daily  . promethazine      . sodium chloride flush  3 mL Intravenous Q12H  . sodium chloride flush       Continuous Infusions: . sodium chloride       LOS: 2 days    Time spent: 25 minutes.     Lelon Frohlich, MD [facility] Pager  [phone]  If 7PM-7AM, please contact night-coverage [email or web address] Password [facility] 01/25/2018, 2:33 PM

## 2018-01-25 NOTE — Plan of Care (Addendum)
PT AWARE GIVENS CAPSULE FAILED. DISCUSSED MANAGEMENT OPTIONS: 1. IVFE/PERFORM CAPSULE IF NEEDED(BLACK TARRY STOOLS/RECTALBLEEDING/TRANSFUSION ANEMIA) OR 2. COMPLETE GIVENS CAPSULE IN 7-10 DAYS. SHE WILL DISCUSS WITH DR. Gala Romney. CONTINUE PO IRON 2 BID. OK TO D/C HOME TODAY IF NO OTHER ISSUES. DISCUSSED WITH DR. Jerilee Hoh.

## 2018-01-28 ENCOUNTER — Encounter: Payer: Self-pay | Admitting: Cardiovascular Disease

## 2018-01-29 ENCOUNTER — Encounter (HOSPITAL_COMMUNITY): Payer: Self-pay | Admitting: Gastroenterology

## 2018-01-29 ENCOUNTER — Other Ambulatory Visit: Payer: Self-pay

## 2018-01-29 NOTE — Patient Outreach (Signed)
[place] [facility]) Care Management  01/29/2018  Erica [name] 12-Jun-1951 [id number]   BSW called patient to determine if she arranged transportation for upcoming appointment on 02/04/18.  Patient reported that she misplaced contact information for [facility] and Medicaid transportation so BSW provided this.  Patient reported that she would call today to schedule transportation.  Patient was encouraged to call if she has any difficulty with scheduling.  BSW will follow up with patient again before the end of the week to ensure she is able to utilize resources.    Ronn Melena, BSW Social Worker [phone]

## 2018-01-30 ENCOUNTER — Encounter: Payer: Self-pay | Admitting: Gastroenterology

## 2018-01-31 ENCOUNTER — Other Ambulatory Visit: Payer: Self-pay

## 2018-01-31 NOTE — Patient Outreach (Signed)
[place] [facility]) Care Management  01/31/2018  Erica [name] December 07, 1950 [id number]  BSW made follow up call to patient regarding transportation.  Patient confirmed that she was able to schedule transportation for her next appointment via [employer].  Patient reported that she will utilize this or transportation benefits through [facility] going forward.  Case closure due to no other social work needs being identified.   Ronn Melena, BSW Social Worker [phone]

## 2018-02-04 DIAGNOSIS — E118 Type 2 diabetes mellitus with unspecified complications: Secondary | ICD-10-CM | POA: Diagnosis not present

## 2018-02-04 DIAGNOSIS — E1122 Type 2 diabetes mellitus with diabetic chronic kidney disease: Secondary | ICD-10-CM | POA: Diagnosis not present

## 2018-02-04 DIAGNOSIS — J441 Chronic obstructive pulmonary disease with (acute) exacerbation: Secondary | ICD-10-CM | POA: Diagnosis not present

## 2018-02-04 DIAGNOSIS — N183 Chronic kidney disease, stage 3 (moderate): Secondary | ICD-10-CM | POA: Diagnosis not present

## 2018-02-13 DIAGNOSIS — J9610 Chronic respiratory failure, unspecified whether with hypoxia or hypercapnia: Secondary | ICD-10-CM | POA: Diagnosis not present

## 2018-02-13 DIAGNOSIS — J441 Chronic obstructive pulmonary disease with (acute) exacerbation: Secondary | ICD-10-CM | POA: Diagnosis not present

## 2018-02-18 NOTE — Progress Notes (Signed)
Patient since seen in hospital and had EGD. Upcoming OV as planned.

## 2018-02-20 DIAGNOSIS — E1122 Type 2 diabetes mellitus with diabetic chronic kidney disease: Secondary | ICD-10-CM | POA: Diagnosis not present

## 2018-02-20 DIAGNOSIS — N183 Chronic kidney disease, stage 3 (moderate): Secondary | ICD-10-CM | POA: Diagnosis not present

## 2018-02-20 DIAGNOSIS — Z9981 Dependence on supplemental oxygen: Secondary | ICD-10-CM | POA: Diagnosis not present

## 2018-02-20 DIAGNOSIS — I10 Essential (primary) hypertension: Secondary | ICD-10-CM | POA: Diagnosis not present

## 2018-02-20 DIAGNOSIS — J449 Chronic obstructive pulmonary disease, unspecified: Secondary | ICD-10-CM | POA: Diagnosis not present

## 2018-02-25 DIAGNOSIS — J449 Chronic obstructive pulmonary disease, unspecified: Secondary | ICD-10-CM | POA: Diagnosis not present

## 2018-02-28 ENCOUNTER — Encounter: Payer: Self-pay | Admitting: Gastroenterology

## 2018-02-28 ENCOUNTER — Telehealth: Payer: Self-pay | Admitting: Gastroenterology

## 2018-02-28 ENCOUNTER — Ambulatory Visit: Payer: Medicare HMO | Admitting: Gastroenterology

## 2018-02-28 NOTE — Telephone Encounter (Signed)
PATIENT WAS A NO SHOW AND LETTER SENT  °

## 2018-03-01 DIAGNOSIS — E782 Mixed hyperlipidemia: Secondary | ICD-10-CM | POA: Diagnosis not present

## 2018-03-01 DIAGNOSIS — J449 Chronic obstructive pulmonary disease, unspecified: Secondary | ICD-10-CM | POA: Diagnosis not present

## 2018-03-01 DIAGNOSIS — Z79899 Other long term (current) drug therapy: Secondary | ICD-10-CM | POA: Diagnosis not present

## 2018-03-01 DIAGNOSIS — E119 Type 2 diabetes mellitus without complications: Secondary | ICD-10-CM | POA: Diagnosis not present

## 2018-03-01 DIAGNOSIS — I10 Essential (primary) hypertension: Secondary | ICD-10-CM | POA: Diagnosis not present

## 2018-03-01 DIAGNOSIS — R748 Abnormal levels of other serum enzymes: Secondary | ICD-10-CM | POA: Diagnosis not present

## 2018-03-01 DIAGNOSIS — N183 Chronic kidney disease, stage 3 (moderate): Secondary | ICD-10-CM | POA: Diagnosis not present

## 2018-03-08 DIAGNOSIS — R0602 Shortness of breath: Secondary | ICD-10-CM | POA: Diagnosis not present

## 2018-03-08 DIAGNOSIS — I10 Essential (primary) hypertension: Secondary | ICD-10-CM | POA: Diagnosis not present

## 2018-03-09 ENCOUNTER — Inpatient Hospital Stay (HOSPITAL_COMMUNITY)
Admission: EM | Admit: 2018-03-09 | Discharge: 2018-03-10 | DRG: 291 | Disposition: A | Discharge status: Home / Self Care | Payer: Medicare HMO | Attending: Family Medicine | Admitting: Family Medicine

## 2018-03-09 ENCOUNTER — Encounter (HOSPITAL_COMMUNITY): Payer: Self-pay

## 2018-03-09 ENCOUNTER — Inpatient Hospital Stay (HOSPITAL_COMMUNITY): Payer: Medicare HMO

## 2018-03-09 ENCOUNTER — Other Ambulatory Visit: Payer: Self-pay

## 2018-03-09 ENCOUNTER — Emergency Department (HOSPITAL_COMMUNITY): Payer: Medicare HMO

## 2018-03-09 DIAGNOSIS — N183 Chronic kidney disease, stage 3 (moderate): Secondary | ICD-10-CM

## 2018-03-09 DIAGNOSIS — E1165 Type 2 diabetes mellitus with hyperglycemia: Secondary | ICD-10-CM | POA: Diagnosis present

## 2018-03-09 DIAGNOSIS — I110 Hypertensive heart disease with heart failure: Secondary | ICD-10-CM | POA: Diagnosis not present

## 2018-03-09 DIAGNOSIS — I509 Heart failure, unspecified: Secondary | ICD-10-CM

## 2018-03-09 DIAGNOSIS — Z79899 Other long term (current) drug therapy: Secondary | ICD-10-CM | POA: Diagnosis not present

## 2018-03-09 DIAGNOSIS — I5031 Acute diastolic (congestive) heart failure: Secondary | ICD-10-CM | POA: Diagnosis present

## 2018-03-09 DIAGNOSIS — Z87891 Personal history of nicotine dependence: Secondary | ICD-10-CM

## 2018-03-09 DIAGNOSIS — I10 Essential (primary) hypertension: Secondary | ICD-10-CM | POA: Diagnosis present

## 2018-03-09 DIAGNOSIS — Z7951 Long term (current) use of inhaled steroids: Secondary | ICD-10-CM

## 2018-03-09 DIAGNOSIS — M549 Dorsalgia, unspecified: Secondary | ICD-10-CM | POA: Diagnosis present

## 2018-03-09 DIAGNOSIS — K219 Gastro-esophageal reflux disease without esophagitis: Secondary | ICD-10-CM | POA: Diagnosis present

## 2018-03-09 DIAGNOSIS — Z8673 Personal history of transient ischemic attack (TIA), and cerebral infarction without residual deficits: Secondary | ICD-10-CM

## 2018-03-09 DIAGNOSIS — R0602 Shortness of breath: Secondary | ICD-10-CM | POA: Diagnosis not present

## 2018-03-09 DIAGNOSIS — J441 Chronic obstructive pulmonary disease with (acute) exacerbation: Secondary | ICD-10-CM | POA: Diagnosis not present

## 2018-03-09 DIAGNOSIS — J9610 Chronic respiratory failure, unspecified whether with hypoxia or hypercapnia: Secondary | ICD-10-CM | POA: Diagnosis present

## 2018-03-09 DIAGNOSIS — M1990 Unspecified osteoarthritis, unspecified site: Secondary | ICD-10-CM | POA: Diagnosis present

## 2018-03-09 DIAGNOSIS — Z720 Tobacco use: Secondary | ICD-10-CM | POA: Diagnosis not present

## 2018-03-09 DIAGNOSIS — G894 Chronic pain syndrome: Secondary | ICD-10-CM | POA: Diagnosis not present

## 2018-03-09 DIAGNOSIS — I130 Hypertensive heart and chronic kidney disease with heart failure and stage 1 through stage 4 chronic kidney disease, or unspecified chronic kidney disease: Principal | ICD-10-CM | POA: Diagnosis present

## 2018-03-09 DIAGNOSIS — Z9981 Dependence on supplemental oxygen: Secondary | ICD-10-CM

## 2018-03-09 DIAGNOSIS — J9611 Chronic respiratory failure with hypoxia: Secondary | ICD-10-CM | POA: Diagnosis present

## 2018-03-09 DIAGNOSIS — D500 Iron deficiency anemia secondary to blood loss (chronic): Secondary | ICD-10-CM | POA: Diagnosis not present

## 2018-03-09 DIAGNOSIS — E785 Hyperlipidemia, unspecified: Secondary | ICD-10-CM | POA: Diagnosis present

## 2018-03-09 DIAGNOSIS — N189 Chronic kidney disease, unspecified: Secondary | ICD-10-CM | POA: Diagnosis present

## 2018-03-09 DIAGNOSIS — E1122 Type 2 diabetes mellitus with diabetic chronic kidney disease: Secondary | ICD-10-CM | POA: Diagnosis present

## 2018-03-09 LAB — GLUCOSE, CAPILLARY
GLUCOSE-CAPILLARY: 175 mg/dL — AB (ref 65–99)
GLUCOSE-CAPILLARY: 130 mg/dL — AB (ref 65–99)

## 2018-03-09 LAB — CBC WITH DIFFERENTIAL/PLATELET
BASOS ABS: 0 10*3/uL (ref 0.0–0.1)
BASOS PCT: 0 %
EOS PCT: 0 %
Eosinophils Absolute: 0 10*3/uL (ref 0.0–0.7)
HCT: 38.2 % (ref 36.0–46.0)
Hemoglobin: 11.9 g/dL — ABNORMAL LOW (ref 12.0–15.0)
Lymphocytes Relative: 10 %
Lymphs Abs: 0.7 10*3/uL (ref 0.7–4.0)
MCH: 26.2 pg (ref 26.0–34.0)
MCHC: 31.2 g/dL (ref 30.0–36.0)
MCV: 84 fL (ref 78.0–100.0)
MONOS PCT: 5 %
Monocytes Absolute: 0.4 10*3/uL (ref 0.1–1.0)
Neutro Abs: 5.9 10*3/uL (ref 1.7–7.7)
Neutrophils Relative %: 85 %
PLATELETS: 247 10*3/uL (ref 150–400)
RBC: 4.55 MIL/uL (ref 3.87–5.11)
RDW: 16.5 % — AB (ref 11.5–15.5)
WBC: 7 10*3/uL (ref 4.0–10.5)

## 2018-03-09 LAB — COMPREHENSIVE METABOLIC PANEL
ALBUMIN: 3.7 g/dL (ref 3.5–5.0)
ALT: 11 U/L — ABNORMAL LOW (ref 14–54)
ANION GAP: 7 (ref 5–15)
AST: 12 U/L — AB (ref 15–41)
Alkaline Phosphatase: 60 U/L (ref 38–126)
BUN: 16 mg/dL (ref 6–20)
CHLORIDE: 103 mmol/L (ref 101–111)
CO2: 30 mmol/L (ref 22–32)
Calcium: 8.3 mg/dL — ABNORMAL LOW (ref 8.9–10.3)
Creatinine, Ser: 1.3 mg/dL — ABNORMAL HIGH (ref 0.44–1.00)
GFR calc Af Amer: 48 mL/min — ABNORMAL LOW (ref >60)
GFR, EST NON AFRICAN AMERICAN: 41 mL/min — AB (ref >60)
GLUCOSE: 137 mg/dL — AB (ref 65–99)
POTASSIUM: 3.4 mmol/L — AB (ref 3.5–5.1)
Sodium: 140 mmol/L (ref 135–145)
TOTAL PROTEIN: 6.6 g/dL (ref 6.5–8.1)
Total Bilirubin: 0.7 mg/dL (ref 0.3–1.2)

## 2018-03-09 LAB — TROPONIN I

## 2018-03-09 LAB — HEMOGLOBIN A1C
Hgb A1c MFr Bld: 5.6 % (ref 4.8–5.6)
MEAN PLASMA GLUCOSE: 114.02 mg/dL

## 2018-03-09 MED ORDER — IPRATROPIUM-ALBUTEROL 0.5-2.5 (3) MG/3ML IN SOLN
3.0000 mL | Freq: Once | RESPIRATORY_TRACT | Status: AC
Start: 2018-03-09 — End: 2018-03-09
  Administered 2018-03-09: 3 mL via RESPIRATORY_TRACT
  Filled 2018-03-09: qty 3

## 2018-03-09 MED ORDER — ASPIRIN EC 81 MG PO TBEC
162.0000 mg | DELAYED_RELEASE_TABLET | Freq: Every day | ORAL | Status: DC
  Administered 2018-03-10: 162 mg via ORAL
  Filled 2018-03-09 (×3): qty 2

## 2018-03-09 MED ORDER — FUROSEMIDE 10 MG/ML IJ SOLN: 60.0000 mg | Freq: Two times a day (BID) | INTRAMUSCULAR | Status: DC

## 2018-03-09 MED ORDER — METOPROLOL TARTRATE 25 MG PO TABS
12.5000 mg | ORAL_TABLET | Freq: Two times a day (BID) | ORAL | Status: DC
  Administered 2018-03-09 – 2018-03-10 (×2): 12.5 mg via ORAL
  Filled 2018-03-09 (×2): qty 1

## 2018-03-09 MED ORDER — FUROSEMIDE 10 MG/ML IJ SOLN
60.0000 mg | Freq: Two times a day (BID) | INTRAMUSCULAR | Status: DC
  Administered 2018-03-09 – 2018-03-10 (×2): 60 mg via INTRAVENOUS
  Filled 2018-03-09 (×2): qty 6

## 2018-03-09 MED ORDER — METHYLPREDNISOLONE SODIUM SUCC 40 MG IJ SOLR
40.0000 mg | Freq: Four times a day (QID) | INTRAMUSCULAR | Status: DC
  Administered 2018-03-09 – 2018-03-10 (×3): 40 mg via INTRAVENOUS
  Filled 2018-03-09 (×3): qty 1

## 2018-03-09 MED ORDER — HYDROCODONE-ACETAMINOPHEN 5-325 MG PO TABS: 1.0000 | ORAL_TABLET | ORAL | Status: DC | PRN

## 2018-03-09 MED ORDER — POTASSIUM CHLORIDE CRYS ER 20 MEQ PO TBCR
40.0000 meq | EXTENDED_RELEASE_TABLET | Freq: Two times a day (BID) | ORAL | Status: DC
  Administered 2018-03-09 – 2018-03-10 (×2): 40 meq via ORAL
  Filled 2018-03-09 (×2): qty 2

## 2018-03-09 MED ORDER — INSULIN ASPART 100 UNIT/ML ~~LOC~~ SOLN
0.0000 [IU] | Freq: Every day | SUBCUTANEOUS | Status: DC
Start: 2018-03-09 — End: 2018-03-10

## 2018-03-09 MED ORDER — ACETAMINOPHEN 325 MG PO TABS: 650.0000 mg | ORAL_TABLET | ORAL | Status: DC | PRN

## 2018-03-09 MED ORDER — OMEGA-3-ACID ETHYL ESTERS 1 G PO CAPS
2.0000 g | ORAL_CAPSULE | Freq: Two times a day (BID) | ORAL | Status: DC
  Administered 2018-03-09 – 2018-03-10 (×2): 2 g via ORAL
  Filled 2018-03-09 (×2): qty 2

## 2018-03-09 MED ORDER — AMLODIPINE BESYLATE 5 MG PO TABS
10.0000 mg | ORAL_TABLET | Freq: Every day | ORAL | Status: DC
  Administered 2018-03-10: 10 mg via ORAL
  Filled 2018-03-09 (×2): qty 2

## 2018-03-09 MED ORDER — SENNOSIDES-DOCUSATE SODIUM 8.6-50 MG PO TABS: 1.0000 | ORAL_TABLET | Freq: Two times a day (BID) | ORAL | Status: DC | PRN

## 2018-03-09 MED ORDER — METHYLPREDNISOLONE SODIUM SUCC 125 MG IJ SOLR
125.0000 mg | Freq: Once | INTRAMUSCULAR | Status: AC
Start: 2018-03-09 — End: 2018-03-09
  Administered 2018-03-09: 125 mg via INTRAVENOUS
  Filled 2018-03-09: qty 2

## 2018-03-09 MED ORDER — BUPROPION HCL ER (SR) 150 MG PO TB12
150.0000 mg | ORAL_TABLET | Freq: Two times a day (BID) | ORAL | Status: DC
  Administered 2018-03-09 – 2018-03-10 (×2): 150 mg via ORAL
  Filled 2018-03-09 (×2): qty 1

## 2018-03-09 MED ORDER — IPRATROPIUM-ALBUTEROL 0.5-2.5 (3) MG/3ML IN SOLN
3.0000 mL | RESPIRATORY_TRACT | Status: DC
  Administered 2018-03-09 – 2018-03-10 (×5): 3 mL via RESPIRATORY_TRACT
  Filled 2018-03-09 (×4): qty 3

## 2018-03-09 MED ORDER — SODIUM CHLORIDE 0.9% FLUSH
3.0000 mL | Freq: Two times a day (BID) | INTRAVENOUS | Status: DC
  Administered 2018-03-09 – 2018-03-10 (×3): 3 mL via INTRAVENOUS

## 2018-03-09 MED ORDER — LINACLOTIDE 145 MCG PO CAPS
145.0000 ug | ORAL_CAPSULE | Freq: Every day | ORAL | Status: DC
  Administered 2018-03-10: 145 ug via ORAL
  Filled 2018-03-09: qty 1

## 2018-03-09 MED ORDER — LORAZEPAM 0.5 MG PO TABS: 0.5000 mg | ORAL_TABLET | Freq: Three times a day (TID) | ORAL | Status: DC | PRN

## 2018-03-09 MED ORDER — PANTOPRAZOLE SODIUM 40 MG PO TBEC
40.0000 mg | DELAYED_RELEASE_TABLET | Freq: Every day | ORAL | Status: DC
  Administered 2018-03-10: 40 mg via ORAL
  Filled 2018-03-09: qty 1

## 2018-03-09 MED ORDER — TRAZODONE HCL 50 MG PO TABS: 50.0000 mg | ORAL_TABLET | Freq: Every evening | ORAL | Status: DC | PRN

## 2018-03-09 MED ORDER — FERROUS SULFATE 325 (65 FE) MG PO TABS
325.0000 mg | ORAL_TABLET | Freq: Every day | ORAL | Status: DC
  Administered 2018-03-10: 325 mg via ORAL
  Filled 2018-03-09 (×3): qty 1

## 2018-03-09 MED ORDER — INSULIN ASPART 100 UNIT/ML ~~LOC~~ SOLN
4.0000 [IU] | Freq: Three times a day (TID) | SUBCUTANEOUS | Status: DC
  Administered 2018-03-09 – 2018-03-10 (×3): 4 [IU] via SUBCUTANEOUS

## 2018-03-09 MED ORDER — HYDRALAZINE HCL 20 MG/ML IJ SOLN: 10.0000 mg | INTRAMUSCULAR | Status: DC | PRN

## 2018-03-09 MED ORDER — FUROSEMIDE 10 MG/ML IJ SOLN
INTRAMUSCULAR | Status: AC
  Filled 2018-03-09: qty 4

## 2018-03-09 MED ORDER — IRBESARTAN 150 MG PO TABS
150.0000 mg | ORAL_TABLET | Freq: Every day | ORAL | Status: DC
Start: 2018-03-10 — End: 2018-03-10
  Administered 2018-03-10: 150 mg via ORAL
  Filled 2018-03-09: qty 1

## 2018-03-09 MED ORDER — LORATADINE 10 MG PO TABS
10.0000 mg | ORAL_TABLET | Freq: Every day | ORAL | Status: DC
Start: 2018-03-10 — End: 2018-03-10
  Administered 2018-03-10: 10 mg via ORAL
  Filled 2018-03-09 (×2): qty 1

## 2018-03-09 MED ORDER — FUROSEMIDE 10 MG/ML IJ SOLN: 40.0000 mg | Freq: Once | INTRAMUSCULAR | Status: DC

## 2018-03-09 MED ORDER — FUROSEMIDE 10 MG/ML IJ SOLN
40.0000 mg | Freq: Once | INTRAMUSCULAR | Status: AC
  Administered 2018-03-09: 40 mg via INTRAVENOUS

## 2018-03-09 MED ORDER — ENOXAPARIN SODIUM 40 MG/0.4ML ~~LOC~~ SOLN
40.0000 mg | SUBCUTANEOUS | Status: DC
  Administered 2018-03-09: 40 mg via SUBCUTANEOUS
  Filled 2018-03-09: qty 0.4

## 2018-03-09 MED ORDER — MOMETASONE FURO-FORMOTEROL FUM 200-5 MCG/ACT IN AERO
2.0000 | INHALATION_SPRAY | Freq: Two times a day (BID) | RESPIRATORY_TRACT | Status: DC
  Administered 2018-03-09 – 2018-03-10 (×2): 2 via RESPIRATORY_TRACT
  Filled 2018-03-09 (×2): qty 8.8

## 2018-03-09 MED ORDER — SODIUM CHLORIDE 0.9 % IV SOLN: 250.0000 mL | INTRAVENOUS | Status: DC | PRN

## 2018-03-09 MED ORDER — ATORVASTATIN CALCIUM 40 MG PO TABS
80.0000 mg | ORAL_TABLET | Freq: Every day | ORAL | Status: DC
Start: 2018-03-09 — End: 2018-03-10
  Administered 2018-03-10: 80 mg via ORAL
  Filled 2018-03-09 (×2): qty 2

## 2018-03-09 MED ORDER — SODIUM CHLORIDE 0.9% FLUSH: 3.0000 mL | INTRAVENOUS | Status: DC | PRN

## 2018-03-09 MED ORDER — INSULIN ASPART 100 UNIT/ML ~~LOC~~ SOLN
0.0000 [IU] | Freq: Three times a day (TID) | SUBCUTANEOUS | Status: DC
  Administered 2018-03-09: 3 [IU] via SUBCUTANEOUS
  Administered 2018-03-10: 2 [IU] via SUBCUTANEOUS
  Administered 2018-03-10: 3 [IU] via SUBCUTANEOUS

## 2018-03-09 MED ORDER — ONDANSETRON HCL 4 MG/2ML IJ SOLN: 4.0000 mg | Freq: Four times a day (QID) | INTRAMUSCULAR | Status: DC | PRN

## 2018-03-09 NOTE — H&P (Addendum)
History and Physical  Erica [name] MWU:[id number] DOB: [birth year] DOA: 03/09/2018  Referring physician: Sabra Heck  PCP: Iona Beard, MD   Chief Complaint: Shortness of breath   HPI: Erica [name] is a 67 y.o. female with history of preserved EF Diastolic CHF, COPD, chronic active smoker recently quit 3 weeks ago presented to ED earlier today with progressive shortness of breath and nonproductive cough that started last night.  No fever or chills.  No chest pain.  Pt has had increasing peripheral edema noted over last several weeks. The edema was mostly noticed in the legs, feet and abdomen.  Pt has been using a home nebulizer with little improvement in symptoms.  Pt claims that she has been taking her medications and respiratory medications as prescribed.  She has been taking her lasix as prescribed.  She remains on chronic supplemental oxygen at home at 2L/min.   She has diabetes mellitus and chronic back pain but reports that they have been stable.  She was treated with nebs and solumedrol in the ED with only minimal improvement in symptoms.  Her chest xray confirms pulmonary edema.  She is being admitted for acute diastolic CHF exacerbation causing acute respiratory distress with hypoxia.    Review of Systems: All systems reviewed and apart from history of presenting illness, are negative.  Past Medical History:  Diagnosis Date  . Arthritis   . CHF (congestive heart failure) ([place])   . Chronic back pain   . COPD (chronic obstructive pulmonary disease) (HCC)    2L home O2  . Diabetes mellitus without complication ([place])   . Hyperlipidemia   . Hypertension   . Stroke (cerebrum) [facility])    Past Surgical History:  Procedure Laterality Date  . ABDOMINAL HYSTERECTOMY    . colonoscopy  2009   Dr. Gala Romney: cluster of diminutive rectal polyps and 2 diminutive polyps in rectosigmoid junction, hyperplastic  . COLONOSCOPY WITH PROPOFOL N/A 10/18/2017   Procedure: COLONOSCOPY WITH PROPOFOL;   Surgeon: Daneil Dolin, MD;  Location[facility];  Service: Endoscopy;  Laterality: N/A;  9:30am  . ESOPHAGOGASTRODUODENOSCOPY N/A 01/25/2018   Procedure: ESOPHAGOGASTRODUODENOSCOPY (EGD);  Surgeon: Danie Binder, MD;  Location: [facility] SU[facility];  Service: Endoscopy;  Laterality: N/A;  . ESOPHAGOGASTRODUODENOSCOPY (EGD) WITH PROPOFOL N/A 08/29/2017   Dr. Gala Romney with propofol: small hiatal hernia. 4cm fundal diverticulum.   Erica [name] CAPSULE STUDY N/A 01/25/2018   Procedure: [name] CAPSULE STUDY;  Surgeon: Danie Binder, MD;  Location: [facility];  Service: Endoscopy;  Laterality: N/A;  . left hand middle finger     in an accident at work, amputated   Social History:  reports that she quit smoking about 5 months ago. Her smoking use included cigarettes. She has a 52.00 pack-year smoking history. She has never used smokeless tobacco. She reports that she does not drink alcohol or use drugs.  No Known Allergies  Family History  Problem Relation Age of Onset  . CVA Mother 58  . Cancer Neg Hx   . Colon cancer Neg Hx     Prior to Admission medications   Medication Sig Start Date End Date Taking? Authorizing Provider  amLODipine (NORVASC) 10 MG tablet Take 1 tablet by mouth daily. 12/27/17   [provider]  atorvastatin (LIPITOR) 80 MG tablet Take 80 mg by mouth daily.  12/04/16   [provider]  budesonide-formoterol (SYMBICORT) 160-4.5 MCG/ACT inhaler Inhale 2 puffs into the lungs 2 (two) times daily. 11/23/15  Barton Dubois, MD  buPROPion [place] SR) 150 MG 12 hr tablet Take 150 mg by mouth 2 (two) times daily. 12/04/16   [provider]  ferrous sulfate 325 (65 FE) MG EC tablet Take 1 tablet (325 mg total) by mouth 2 (two) times daily. 01/25/18 02/24/18  Erica [name], Erica Halsted, MD  furosemide (LASIX) 40 MG tablet Take 1 tablet (40 mg total) by mouth daily. 12/23/17   [name], Nishant, MD  ipratropium-albuterol (DUONEB) 0.5-2.5 (3) MG/3ML SOLN Take 3 mLs by  nebulization every 6 (six) hours as needed (for shortness of breath).    [provider]  Linaclotide Rolan Lipa) 145 MCG CAPS capsule Take 145 mcg by mouth daily.    [provider]  metoprolol tartrate (LOPRESSOR) 25 MG tablet Take 0.5 tablets (12.5 mg total) by mouth 2 (two) times daily. 08/30/17   Erica Eva, MD  omega-3 acid ethyl esters (LOVAZA) 1 g capsule Take 2 g by mouth 2 (two) times daily.  12/07/16   [provider]  OXYGEN Inhale 2 L into the lungs continuous.    [provider]  pantoprazole (PROTONIX) 40 MG tablet Take 1 tablet (40 mg total) by mouth daily. 01/25/18   Erica [name], Erica Halsted, MD  saxagliptin HCl (ONGLYZA) 5 MG TABS tablet Take 5 mg by mouth daily. Reported on 09/22/2015    [provider]  SPIRIVA HANDIHALER 18 MCG inhalation capsule Take 1 puff by mouth daily. 10/31/17   [provider]  valsartan-hydrochlorothiazide (DIOVAN-HCT) 160-25 MG tablet Take 1 tablet by mouth daily. 12/27/17   [provider]   Physical Exam: Vitals:   03/09/18 1400 03/09/18 1415 03/09/18 1425 03/09/18 1430  BP: (!) 142/61   (!) 164/75  Pulse:  76  69  Resp: (!) 22 (!) 30  20  Temp:      TempSrc:      SpO2:  95% 96% 94%  Weight:      Height:        General exam: chronically ill appearing female, Moderately built and nourished patient, lying comfortably supine on the gurney in no obvious distress.  Head, eyes and ENT: Nontraumatic and normocephalic. Pupils equally reacting to light and accommodation. Oral mucosa moist.  Neck: Supple. No JVD, carotid bruit or thyromegaly.  Lymphatics: No lymphadenopathy.  Respiratory system: tachypnea noted. Diffuse bilateral inspiratory/expiratory wheezing heard. Crackles at both bases heard. No increased work of breathing.  Cardiovascular system: S1 and S2 heard, RRR. No JVD, murmurs, gallops, clicks, bilateral pedal edema.   Gastrointestinal system: Abdomen is nondistended, soft and  nontender. Normal bowel sounds heard. No organomegaly or masses appreciated.  Central nervous system: Alert and oriented. No focal neurological deficits.  Extremities: diffuse 2+ edema bilateral LEs, pitting, Symmetric 5 x 5 power. Peripheral pulses symmetrically felt.   Skin: No rashes or acute findings.  Musculoskeletal system: Negative exam.  Psychiatry: Pleasant and cooperative.  Labs on Admission:  Basic Metabolic Panel: Recent Labs  Lab 03/09/18 1348  NA 140  K 3.4*  CL 103  CO2 30  GLUCOSE 137*  BUN 16  CREATININE 1.30*  CALCIUM 8.3*   Liver Function Tests: Recent Labs  Lab 03/09/18 1348  AST 12*  ALT 11*  ALKPHOS 60  BILITOT 0.7  PROT 6.6  ALBUMIN 3.7   No results for input(s): LIPASE, AMYLASE in the last 168 hours. No results for input(s): AMMONIA in the last 168 hours. CBC: Recent Labs  Lab 03/09/18 1348  WBC 7.0  NEUTROABS 5.9  HGB 11.9*  HCT 38.2  MCV 84.0  PLT 247   Cardiac Enzymes: Recent Labs  Lab 03/09/18 1348  TROPONINI <0.03    BNP (last 3 results) No results for input(s): PROBNP in the last 8760 hours. CBG: No results for input(s): GLUCAP in the last 168 hours.  Radiological Exams on Admission: Dg Chest Port 1 View  Result Date: 03/09/2018 CLINICAL DATA:  Shortness of breath since last night. History of COPD. EXAM: PORTABLE CHEST 1 VIEW COMPARISON:  Chest x-rays dated 01/23/2018 and 12/07/2017. FINDINGS: Stable cardiomegaly. Aortic atherosclerosis. Diffuse bilateral interstitial prominence, increased compared to the most recent chest x-ray of 01/23/2018 indicating edema superimposed on chronic interstitial lung disease. Probable small bilateral pleural effusions. Chronic calcified pleural plaque within the upper RIGHT hemithorax. No acute or suspicious osseous finding. IMPRESSION: 1. Bilateral interstitial edema superimposed on chronic interstitial lung disease, consistent with active CHF/volume overload. 2. Stable cardiomegaly. 3.  Calcified pleural plaques indicating previous asbestos exposure, better demonstrated on earlier chest CT of 12/19/2017. Electronically Signed   By: Franki Cabot M.D.   On: 03/09/2018 14:14    EKG: Personally reviewed. Normal sinus rhythm  Assessment/Plan Principal Problem:   Acute diastolic (congestive) heart failure (HCC) Active Problems:   Chronic pain syndrome   Hypertension   Chronic respiratory failure (HCC)   GERD (gastroesophageal reflux disease)   Tobacco abuse   Type 2 diabetes mellitus with hyperglycemia (HCC)   Iron deficiency anemia due to chronic blood loss   COPD with acute exacerbation (HCC)   Hyperlipidemia   CKD (chronic kidney disease)  1. Acute diastolic CHF exacerbation - admit to telemetry bed for IV diuresis with lasix.  Hold home HCTZ while diuresing with IV lasix.  Follow electrolytes, Replace potassium and magnesium as needed.  Follow daily weights.  Echo reviewed from March 2019:  Pt had preserved EF 78% with diastolic dysfunction.   Follow blood pressures.   2. Acute COPD exacerbation - Pt reports that she quit tobacco 3 weeks ago and she was encouraged to continue to avoid tobacco.  Pt was given IV solumedrol in ED and will continue for now.  Continue nebulizers around the clock with Duonebs every 4 hours and respiratory therapy care as needed.  Continue supplemental oxygen.  Consider PT eval in next 24-36 hours as she clinically improves.  3. Essential hypertension - see orders, resumed home blood pressure lowering medications.   4. GERD - protonix for GI protection.   5. IDA - resume home iron sulfate.  Follow Hg.  6. Type 2 DM with hyperglycemia - sliding scale coverage ordered. A1c was 4.7% 3 months ago.   7. CKD stage 3 - Follow daily BMPs in the setting of IV diuresis with lasix.   8. Chronic pain syndrome / chronic back pain - No current complaints, as needed medications ordered for moderate to severe symptoms.    DVT Prophylaxis: lovenox Code Status:  Full   Family Communication: sister at bedside Disposition Plan: Inpatient for IV diuresis   Time spent: 57 minutes  Irwin Brakeman, MD [facility] Pager [phone]  If 7PM-7AM, please contact night-coverage [email or web address] Password [facility] 03/09/2018, 3:08 PM

## 2018-03-09 NOTE — ED Triage Notes (Signed)
Pt has been SOB since last night. Is usually 2L O2 [place]. History of COPD and feels like an exacerbation. Pt purse lip breathing.

## 2018-03-09 NOTE — ED Provider Notes (Signed)
[facility] EMERGENCY DEPARTMENT Provider Note   CSN: [id number] Arrival date & time: 03/09/18  1316     History   Chief Complaint Chief Complaint  Patient presents with  . Shortness of Breath    HPI Erica [name] is a 67 y.o. female.  Level 5 caveat for urgent need for intervention.  Increasing dyspnea since last night.  Patient has been using a nebulizer machine with minimal success.  She is normally on 2 L nasal cannula.  Past medical history includes COPD, CHF, diabetes, hypertension, CVA.  No substernal chest pain, fever.  She does complain of peripheral edema.  Dyspnea worsens with exertion.     Past Medical History:  Diagnosis Date  . Arthritis   . CHF (congestive heart failure) ([place])   . Chronic back pain   . COPD (chronic obstructive pulmonary disease) (HCC)    2L home O2  . Diabetes mellitus without complication ([place])   . Hyperlipidemia   . Hypertension   . Stroke (cerebrum) [facility])     Patient Active Problem List   Diagnosis Date Noted  . Acute diastolic (congestive) heart failure ([place]) 03/09/2018  . AVM (arteriovenous malformation) of duodenum, acquired   . GIB (gastrointestinal bleeding) 01/23/2018  . CKD (chronic kidney disease) 01/23/2018  . Hyperkalemia 12/21/2017  . Hyperlipidemia 12/18/2017  . Hypomagnesemia 12/17/2017  . Acute renal failure superimposed on stage 3 chronic kidney disease ([place]) 12/07/2017  . Acute on chronic respiratory failure with hypoxia ([place]) 12/07/2017  . COPD with acute exacerbation ([place]) 12/07/2017  . Acute diastolic CHF (congestive heart failure) ([place]) 09/19/2017  . Iron deficiency anemia due to chronic blood loss   . Acute diverticulitis 08/29/2017  . Heme positive stool   . Symptomatic anemia   . GI bleed 08/27/2017  . Elevated troponin 08/27/2017  . (HFpEF) heart failure with preserved ejection fraction ([place]) 08/27/2017  . Acute dyspnea   . Acute on chronic respiratory failure ([place]) 02/11/2017  . Acute on chronic  diastolic CHF (congestive heart failure) ([place]) 02/11/2017  . Benign essential HTN 11/20/2015  . GERD (gastroesophageal reflux disease) 11/20/2015  . Tobacco abuse 11/20/2015  . Type 2 diabetes mellitus with hyperglycemia ([place]) 11/20/2015  . COPD exacerbation ([place]) 09/05/2015  . Chronic pain syndrome 09/05/2015  . Hypertension 09/05/2015  . Diabetes mellitus type 2, controlled ([place]) 09/05/2015  . Chronic respiratory failure ([place]) 09/05/2015  . OLECRANON BURSITIS, LEFT 08/04/2010    Past Surgical History:  Procedure Laterality Date  . ABDOMINAL HYSTERECTOMY    . colonoscopy  2009   Dr. Gala Romney: cluster of diminutive rectal polyps and 2 diminutive polyps in rectosigmoid junction, hyperplastic  . COLONOSCOPY WITH PROPOFOL N/A 10/18/2017   Procedure: COLONOSCOPY WITH PROPOFOL;  Surgeon: Daneil Dolin, MD;  Location: [facility] END[facility] SUITE;  Service: Endoscopy;  Laterality: N/A;  9:30am  . ESOPHAGOGASTRODUODENOSCOPY N/A 01/25/2018   Procedure: ESOPHAGOGASTRODUODENOSCOPY (EGD);  Surgeon: Danie Binder, MD;  Location: [facility] ENDO SUITE;  Service: Endoscopy;  Laterality: N/A;  . ESOPHAGOGASTRODUODENOSCOPY (EGD) WITH PROPOFOL N/A 08/29/2017   Dr. Gala Romney with propofol: small hiatal hernia. 4cm fundal diverticulum.   Marland Kitchen GIVENS CAPSULE STUDY N/A 01/25/2018   Procedure: GIVENS CAPSULE STUDY;  Surgeon: Danie Binder, MD;  Location: [facility];  Service: Endoscopy;  Laterality: N/A;  . left hand middle finger     in an accident at work, amputated     OB History    Gravida  0   Para  0   Term  0   Preterm  0   AB  0   Living        SAB  0   TAB  0   Ectopic  0   Multiple      Live Births               Home Medications    Prior to Admission medications   Medication Sig Start Date End Date Taking? Authorizing Provider  amLODipine (NORVASC) 10 MG tablet Take 1 tablet by mouth daily. 12/27/17   [provider]  atorvastatin (LIPITOR) 80 MG tablet Take 80 mg by mouth daily.   12/04/16   [provider]  budesonide-formoterol (SYMBICORT) 160-4.5 MCG/ACT inhaler Inhale 2 puffs into the lungs 2 (two) times daily. 11/23/15   Barton Dubois, MD  buPROPion [facility] SR) 150 MG 12 hr tablet Take 150 mg by mouth 2 (two) times daily. 12/04/16   [provider]  ferrous sulfate 325 (65 FE) MG EC tablet Take 1 tablet (325 mg total) by mouth 2 (two) times daily. 01/25/18 02/24/18  Isaac Bliss, Rayford Halsted, MD  furosemide (LASIX) 40 MG tablet Take 1 tablet (40 mg total) by mouth daily. 12/23/17   Dhungel, Nishant, MD  ipratropium-albuterol (DUONEB) 0.5-2.5 (3) MG/3ML SOLN Take 3 mLs by nebulization every 6 (six) hours as needed (for shortness of breath).    [provider]  Linaclotide Rolan Lipa) 145 MCG CAPS capsule Take 145 mcg by mouth daily.    [provider]  metoprolol tartrate (LOPRESSOR) 25 MG tablet Take 0.5 tablets (12.5 mg total) by mouth 2 (two) times daily. 08/30/17   Orson Eva, MD  omega-3 acid ethyl esters (LOVAZA) 1 g capsule Take 2 g by mouth 2 (two) times daily.  12/07/16   [provider]  OXYGEN Inhale 2 L into the lungs continuous.    [provider]  pantoprazole (PROTONIX) 40 MG tablet Take 1 tablet (40 mg total) by mouth daily. 01/25/18   Isaac Bliss, Rayford Halsted, MD  saxagliptin HCl (ONGLYZA) 5 MG TABS tablet Take 5 mg by mouth daily. Reported on 09/22/2015    [provider]  SPIRIVA HANDIHALER 18 MCG inhalation capsule Take 1 puff by mouth daily. 10/31/17   [provider]  valsartan-hydrochlorothiazide (DIOVAN-HCT) 160-25 MG tablet Take 1 tablet by mouth daily. 12/27/17   [provider]    Family History Family History  Problem Relation Age of Onset  . CVA Mother 75  . Cancer Neg Hx   . Colon cancer Neg Hx     Social History Social History   Tobacco Use  . Smoking status: Former Smoker    Packs/day: 1.00    Years: 52.00    Pack years: 52.00    Types: Cigarettes    Last  attempt to quit: 09/23/2017    Years since quitting: 0.4  . Smokeless tobacco: Never Used  Substance Use Topics  . Alcohol use: No  . Drug use: No     Allergies   Patient has no known allergies.   Review of Systems Review of Systems  Unable to perform ROS: Acuity of condition     Physical Exam Updated Vital Signs BP (!) 164/75   Pulse 69   Temp 98.3 F (36.8 C) (Oral)   Resp 20   Ht 5\' 4"  (1.626 m)   Wt 79.8 kg (176 lb)   SpO2 94%   BMI 30.21 kg/m   Physical Exam  Constitutional: She is oriented  to person, place, and time.  Dyspneic and tachypneic  HENT:  Head: Normocephalic and atraumatic.  Eyes: Conjunctivae are normal.  Neck: Neck supple.  Cardiovascular: Normal rate and regular rhythm.  Pulmonary/Chest:  Tachypnea, bilateral expiratory wheezes and rales at the bases.  Abdominal: Soft. Bowel sounds are normal.  Musculoskeletal: Normal range of motion.  Neurological: She is alert and oriented to person, place, and time.  Skin:  2-3+ peripheral edema  Psychiatric: She has a normal mood and affect. Her behavior is normal.  Nursing note and vitals reviewed.    ED Treatments / Results  Labs (all labs ordered are listed, but only abnormal results are displayed) Labs Reviewed  CBC WITH DIFFERENTIAL/PLATELET - Abnormal; Notable for the following components:      Result Value   Hemoglobin 11.9 (*)    RDW 16.5 (*)    All other components within normal limits  COMPREHENSIVE METABOLIC PANEL - Abnormal; Notable for the following components:   Potassium 3.4 (*)    Glucose, Bld 137 (*)    Creatinine, Ser 1.30 (*)    Calcium 8.3 (*)    AST 12 (*)    ALT 11 (*)    GFR calc non Af Amer 41 (*)    GFR calc Af Amer 48 (*)    All other components within normal limits  TROPONIN I    EKG None  Radiology Dg Chest Port 1 View  Result Date: 03/09/2018 CLINICAL DATA:  Shortness of breath since last night. History of COPD. EXAM: PORTABLE CHEST 1 VIEW COMPARISON:   Chest x-rays dated 01/23/2018 and 12/07/2017. FINDINGS: Stable cardiomegaly. Aortic atherosclerosis. Diffuse bilateral interstitial prominence, increased compared to the most recent chest x-ray of 01/23/2018 indicating edema superimposed on chronic interstitial lung disease. Probable small bilateral pleural effusions. Chronic calcified pleural plaque within the upper RIGHT hemithorax. No acute or suspicious osseous finding. IMPRESSION: 1. Bilateral interstitial edema superimposed on chronic interstitial lung disease, consistent with active CHF/volume overload. 2. Stable cardiomegaly. 3. Calcified pleural plaques indicating previous asbestos exposure, better demonstrated on earlier chest CT of 12/19/2017. Electronically Signed   By: Franki Cabot M.D.   On: 03/09/2018 14:14    Procedures Procedures (including critical care time)  Medications Ordered in ED Medications  furosemide (LASIX) injection 40 mg (has no administration in time range)  methylPREDNISolone sodium succinate (SOLU-MEDROL) 125 mg/2 mL injection 125 mg (125 mg Intravenous Given 03/09/18 1414)  ipratropium-albuterol (DUONEB) 0.5-2.5 (3) MG/3ML nebulizer solution 3 mL (3 mLs Nebulization Given 03/09/18 1423)     Initial Impression / Assessment and Plan / ED Course  I have reviewed the triage vital signs and the nursing notes.  Pertinent labs & imaging results that were available during my care of the patient were reviewed by me and considered in my medical decision making (see chart for details).     Patient presents with worsening dyspnea.  Work-up suggests both a CHF and COPD exacerbation.  Nebulizer treatment, IV steroids, IV Lasix.  Discussed with patient and her sister.  Admit to general medicine.   CRITICAL CARE Performed by: Nat Christen Total critical care time: 30 minutes Critical care time was exclusive of separately billable procedures and treating other patients. Critical care was necessary to treat or prevent  imminent or life-threatening deterioration. Critical care was time spent personally by me on the following activities: development of treatment plan with patient and/or surrogate as well as nursing, discussions with consultants, evaluation of patient's response to treatment, examination of patient, obtaining history  from patient or surrogate, ordering and performing treatments and interventions, ordering and review of laboratory studies, ordering and review of radiographic studies, pulse oximetry and re-evaluation of patient's condition.  Final Clinical Impressions(s) / ED Diagnoses   Final diagnoses:  Acute congestive heart failure, unspecified heart failure type [facility])  COPD exacerbation [facility])    ED Discharge Orders    None       Nat Christen, MD 03/09/18 1458

## 2018-03-10 ENCOUNTER — Inpatient Hospital Stay (HOSPITAL_COMMUNITY): Payer: Medicare HMO

## 2018-03-10 DIAGNOSIS — E785 Hyperlipidemia, unspecified: Secondary | ICD-10-CM

## 2018-03-10 LAB — CBC WITH DIFFERENTIAL/PLATELET
BASOS ABS: 0 10*3/uL (ref 0.0–0.1)
Basophils Relative: 0 %
EOS PCT: 0 %
Eosinophils Absolute: 0 10*3/uL (ref 0.0–0.7)
HCT: 39.2 % (ref 36.0–46.0)
Hemoglobin: 12.3 g/dL (ref 12.0–15.0)
LYMPHS PCT: 8 %
Lymphs Abs: 0.4 10*3/uL — ABNORMAL LOW (ref 0.7–4.0)
MCH: 26.1 pg (ref 26.0–34.0)
MCHC: 31.4 g/dL (ref 30.0–36.0)
MCV: 83.1 fL (ref 78.0–100.0)
Monocytes Absolute: 0.1 10*3/uL (ref 0.1–1.0)
Monocytes Relative: 1 %
NEUTROS ABS: 4.6 10*3/uL (ref 1.7–7.7)
Neutrophils Relative %: 91 %
PLATELETS: 249 10*3/uL (ref 150–400)
RBC: 4.72 MIL/uL (ref 3.87–5.11)
RDW: 16.3 % — ABNORMAL HIGH (ref 11.5–15.5)
WBC: 5.1 10*3/uL (ref 4.0–10.5)

## 2018-03-10 LAB — BASIC METABOLIC PANEL
ANION GAP: 12 (ref 5–15)
BUN: 23 mg/dL — ABNORMAL HIGH (ref 6–20)
CHLORIDE: 101 mmol/L (ref 101–111)
CO2: 31 mmol/L (ref 22–32)
CREATININE: 1.52 mg/dL — AB (ref 0.44–1.00)
Calcium: 8.2 mg/dL — ABNORMAL LOW (ref 8.9–10.3)
GFR calc non Af Amer: 34 mL/min — ABNORMAL LOW (ref >60)
GFR, EST AFRICAN AMERICAN: 40 mL/min — AB (ref >60)
Glucose, Bld: 166 mg/dL — ABNORMAL HIGH (ref 65–99)
POTASSIUM: 3.6 mmol/L (ref 3.5–5.1)
SODIUM: 144 mmol/L (ref 135–145)

## 2018-03-10 LAB — MAGNESIUM: MAGNESIUM: 1.7 mg/dL (ref 1.7–2.4)

## 2018-03-10 LAB — GLUCOSE, CAPILLARY
GLUCOSE-CAPILLARY: 147 mg/dL — AB (ref 65–99)
Glucose-Capillary: 164 mg/dL — ABNORMAL HIGH (ref 65–99)

## 2018-03-10 LAB — HIV ANTIBODY (ROUTINE TESTING W REFLEX): HIV Screen 4th Generation wRfx: NONREACTIVE

## 2018-03-10 MED ORDER — POTASSIUM CHLORIDE CRYS ER 20 MEQ PO TBCR: 40.0000 meq | EXTENDED_RELEASE_TABLET | Freq: Every day | ORAL | 0 refills | Status: DC

## 2018-03-10 MED ORDER — IPRATROPIUM-ALBUTEROL 0.5-2.5 (3) MG/3ML IN SOLN: 3.0000 mL | Freq: Four times a day (QID) | RESPIRATORY_TRACT | Status: DC

## 2018-03-10 MED ORDER — DOXYCYCLINE HYCLATE 100 MG PO CAPS: 100.0000 mg | ORAL_CAPSULE | Freq: Two times a day (BID) | ORAL | 0 refills | Status: DC

## 2018-03-10 MED ORDER — FUROSEMIDE 40 MG PO TABS: 40.0000 mg | ORAL_TABLET | Freq: Two times a day (BID) | ORAL | 1 refills | Status: DC

## 2018-03-10 MED ORDER — PREDNISONE 20 MG PO TABS: ORAL_TABLET | ORAL | 0 refills | Status: DC

## 2018-03-10 MED ORDER — FUROSEMIDE 40 MG PO TABS: 40.0000 mg | ORAL_TABLET | Freq: Two times a day (BID) | ORAL | 0 refills | Status: DC

## 2018-03-10 MED ORDER — DOXYCYCLINE HYCLATE 100 MG PO CAPS: 100.0000 mg | ORAL_CAPSULE | Freq: Two times a day (BID) | ORAL | 0 refills | Status: AC

## 2018-03-10 NOTE — Progress Notes (Signed)
IV removed and discharge instructions reviewed.  Scripts given.  Sister to drive home

## 2018-03-10 NOTE — Discharge Instructions (Signed)
Follow with Primary MD  Iona Beard, MD  and other consultants as instructed your Hospitalist MD  Please get a complete blood count and chemistry panel checked by your Primary MD at your next visit, and again as instructed by your Primary MD.  Get Medicines reviewed and adjusted: Please take all your medications with you for your next visit with your Primary MD  Laboratory/radiological data: Please request your Primary MD to go over all hospital tests and procedure/radiological results at the follow up, please ask your Primary MD to get all Hospital records sent to his/her office.  In some cases, they will be blood work, cultures and biopsy results pending at the time of your discharge. Please request that your primary care M.D. follows up on these results.  Also Note the following: If you experience worsening of your admission symptoms, develop shortness of breath, life threatening emergency, suicidal or homicidal thoughts you must seek medical attention immediately by calling 911 or calling your MD immediately  if symptoms less severe.  You must read complete instructions/literature along with all the possible adverse reactions/side effects for all the Medicines you take and that have been prescribed to you. Take any new Medicines after you have completely understood and accpet all the possible adverse reactions/side effects.   Do not drive when taking Pain medications or sleeping medications (Benzodaizepines)  Do not take more than prescribed Pain, Sleep and Anxiety Medications. It is not advisable to combine anxiety,sleep and pain medications without talking with your primary care practitioner  Special Instructions: If you have smoked or chewed Tobacco  in the last 2 yrs please stop smoking, stop any regular Alcohol  and or any Recreational drug use.  Wear Seat belts while driving.  Please note: You were cared for by a hospitalist during your hospital stay. Once you are discharged,  your primary care physician will handle any further medical issues. Please note that NO REFILLS for any discharge medications will be authorized once you are discharged, as it is imperative that you return to your primary care physician (or establish a relationship with a primary care physician if you do not have one) for your post hospital discharge needs so that they can reassess your need for medications and monitor your lab values.      Acute Respiratory Failure, Adult Acute respiratory failure occurs when there is not enough oxygen passing from your lungs to your body. When this happens, your lungs have trouble removing carbon dioxide from the blood. This causes your blood oxygen level to drop too low as carbon dioxide builds up. Acute respiratory failure is a medical emergency. It can develop quickly, but it is temporary if treated promptly. Your lung capacity, or how much air your lungs can hold, may improve with time, exercise, and treatment. What are the causes? There are many possible causes of acute respiratory failure, including:  Lung injury.  Chest injury or damage to the ribs or tissues near the lungs.  Lung conditions that affect the flow of air and blood into and out of the lungs, such as pneumonia, acute respiratory distress syndrome, and cystic fibrosis.  Medical conditions, such as strokes or spinal cord injuries, that affect the muscles and nerves that control breathing.  Blood infection (sepsis).  Inflammation of the pancreas (pancreatitis).  A blood clot in the lungs (pulmonary embolism).  A large-volume blood transfusion.  Burns.  Near-drowning.  Seizure.  Smoke inhalation.  Reaction to medicines.  Alcohol or drug overdose.  What increases  the risk? This condition is more likely to develop in people who have:  A blocked airway.  Asthma.  A condition or disease that damages or weakens the muscles, nerves, bones, or tissues that are involved in  breathing.  A serious infection.  A health problem that blocks the unconscious reflex that is involved in breathing, such as hypothyroidism or sleep apnea.  A lung injury or trauma.  What are the signs or symptoms? Trouble breathing is the main symptom of acute respiratory failure. Symptoms may also include:  Rapid breathing.  Restlessness or anxiety.  Skin, lips, or fingernails that appear blue (cyanosis).  Rapid heart rate.  Abnormal heart rhythms (arrhythmias).  Confusion or changes in behavior.  Tiredness or loss of energy.  Feeling sleepy or having a loss of consciousness.  How is this diagnosed? Your health care provider can diagnose acute respiratory failure with a medical history and physical exam. During the exam, your health care provider will listen to your heart and check for crackling or wheezing sounds in your lungs. Your may also have tests to confirm the diagnosis and determine what is causing respiratory failure. These tests may include:  Measuring the amount of oxygen in your blood (pulse oximetry). The measurement comes from a small device that is placed on your finger, earlobe, or toe.  Other blood tests to measure blood gases and to look for signs of infection.  Sampling your cerebral spinal fluid or tracheal fluid to check for infections.  Chest X-ray to look for fluid in spaces that should be filled with air.  Electrocardiogram (ECG) to look at the heart's electrical activity.  How is this treated? Treatment for this condition usually takes places in a hospital intensive care unit (ICU). Treatment depends on what is causing the condition. It may include one or more treatments until your symptoms improve. Treatment may include:  Supplemental oxygen. Extra oxygen is given through a tube in the nose, a face mask, or a hood.  A device such as a continuous positive airway pressure (CPAP) or bi-level positive airway pressure (BiPAP or BPAP) machine. This  treatment uses mild air pressure to keep the airways open. A mask or other device will be placed over your nose or mouth. A tube that is connected to a motor will deliver oxygen through the mask.  Ventilator. This treatment helps move air into and out of the lungs. This may be done with a bag and mask or a machine. For this treatment, a tube is placed in your windpipe (trachea) so air and oxygen can flow to the lungs.  Extracorporeal membrane oxygenation (ECMO). This treatment temporarily takes over the function of the heart and lungs, supplying oxygen and removing carbon dioxide. ECMO gives the lungs a chance to recover. It may be used if a ventilator is not effective.  Tracheostomy. This is a procedure that creates a hole in the neck to insert a breathing tube.  Receiving fluids and medicines.  Rocking the bed to help breathing.  Follow these instructions at home:  Take over-the-counter and prescription medicines only as told by your health care provider.  Return to normal activities as told by your health care provider. Ask your health care provider what activities are safe for you.  Keep all follow-up visits as told by your health care provider. This is important. How is this prevented? Treating infections and medical conditions that may lead to acute respiratory failure can help prevent the condition from developing. Contact a health care  provider if:  You have a fever.  Your symptoms do not improve or they get worse. Get help right away if:  You are having trouble breathing.  You lose consciousness.  Your have cyanosis or turn blue.  You develop a rapid heart rate.  You are confused. These symptoms may represent a serious problem that is an emergency. Do not wait to see if the symptoms will go away. Get medical help right away. Call your local emergency services (911 in the [place] Do not drive yourself to the hospital. This information is not intended to replace advice  given to you by your health care provider. Make sure you discuss any questions you have with your health care provider. Document Released: 09/16/2013 Document Revised: 04/08/2016 Document Reviewed: 03/29/2016 Elsevier Interactive Patient Education  2018 [employer]   Chronic Obstructive Pulmonary Disease Exacerbation Chronic obstructive pulmonary disease (COPD) is a common lung problem. In COPD, the flow of air from the lungs is limited. COPD exacerbations are times that breathing gets worse and you need extra treatment. Without treatment they can be life threatening. If they happen often, your lungs can become more damaged. If your COPD gets worse, your doctor may treat you with:  Medicines.  Oxygen.  Different ways to clear your airway, such as using a mask.  Follow these instructions at home:  Do not smoke.  Avoid tobacco smoke and other things that bother your lungs.  If given, take your antibiotic medicine as told. Finish the medicine even if you start to feel better.  Only take medicines as told by your doctor.  Drink enough fluids to keep your pee (urine) clear or pale yellow (unless your doctor has told you not to).  Use a cool mist machine (vaporizer).  If you use oxygen or a machine that turns liquid medicine into a mist (nebulizer), continue to use them as told.  Keep up with shots (vaccinations) as told by your doctor.  Exercise regularly.  Eat healthy foods.  Keep all doctor visits as told. Get help right away if:  You are very short of breath and it gets worse.  You have trouble talking.  You have bad chest pain.  You have blood in your spit (sputum).  You have a fever.  You keep throwing up (vomiting).  You feel weak, or you pass out (faint).  You feel confused.  You keep getting worse. This information is not intended to replace advice given to you by your health care provider. Make sure you discuss any questions you have with your health  care provider. Document Released: 08/31/2011 Document Revised: 02/17/2016 Document Reviewed: 05/16/2013 Elsevier Interactive Patient Education  2017 [place].   Heart Failure Action Plan A heart failure action plan helps you understand what to do when you have symptoms of heart failure. Follow the plan that was created by you and your health care provider. Review your plan each time you visit your health care provider. Red zone These signs and symptoms mean you should get medical help right away:  You have trouble breathing when resting.  You have a dry cough that is getting worse.  You have swelling or pain in your legs or abdomen that is getting worse.  You suddenly gain more than 2-3 lb (0.9-1.4 kg) in a day, or more than 5 lb (2.3 kg) in one week. This amount may be more or less depending on your condition.  You have trouble staying awake or you feel confused.  You  have chest pain.  You do not have an appetite.  You pass out.  If you experience any of these symptoms:  Call your local emergency services (911 in the [place] right away or seek help at the emergency department of the nearest hospital.  Yellow zone These signs and symptoms mean your condition may be getting worse and you should make some changes:  You have trouble breathing when you are active or you need to sleep with extra pillows.  You have swelling in your legs or abdomen.  You gain 2-3 lb (0.9-1.4 kg) in one day, or 5 lb (2.3 kg) in one week. This amount may be more or less depending on your condition.  You get tired easily.  You have trouble sleeping.  You have a dry cough.  If you experience any of these symptoms:  Contact your health care provider within the next day.  Your health care provider may adjust your medicines.  Green zone These signs mean you are doing well and can continue what you are doing:  You do not have shortness of breath.  You have very little swelling or no new  swelling.  Your weight is stable (no gain or loss).  You have a normal activity level.  You do not have chest pain or any other new symptoms.  Follow these instructions at home:  Take over-the-counter and prescription medicines only as told by your health care provider.  Weigh yourself daily. Your target weight is __________ lb (__________ kg). ? Call your health care provider if you gain more than __________ lb (__________ kg) in a day, or more than __________ lb (__________ kg) in one week.  Eat a heart-healthy diet. Work with a diet and nutrition specialist (dietitian) to create an eating plan that is best for you.  Keep all follow-up visits as told by your health care provider. This is important. Where to find more information:  American Heart Association: www.heart.org Summary  Follow the action plan that was created by you and your health care provider.  Get help right away if you have any symptoms in the Red zone. This information is not intended to replace advice given to you by your health care provider. Make sure you discuss any questions you have with your health care provider. Document Released: 10/21/2016 Document Revised: 10/21/2016 Document Reviewed: 10/21/2016 Elsevier Interactive Patient Education  2018 [place].   Heart Failure Exacerbation  Heart failure is a condition in which the heart does not fill up with enough blood, and therefore does not pump enough blood and oxygen to the body. When this happens, parts of the body do not get the blood and oxygen they need to function properly. This can cause symptoms such as breathing problems, fatigue, swelling, and confusion. Heart failure exacerbation refers to heart failure symptoms that get worse. The symptoms may get worse suddenly or develop slowly over time. Heart failure exacerbation is a serious medical problem that should be treated right away. What are the causes? A heart failure exacerbation can be  triggered by:  Not taking your heart failure medicines correctly.  Infections.  Eating an unhealthy diet or a diet that is high in salt (sodium).  Drinking too much fluid.  Drinking alcohol.  Taking illegal drugs, such as cocaine or methamphetamine.  Not exercising.  Other causes include:  Other heart conditions such as an irregular heartbeat (arrhythmia).  Anemia.  Other medical problems, such as kidney failure.  Sometimes the cause of the exacerbation  is not known. What are the signs or symptoms? When heart failure symptoms suddenly or slowly get worse, this may be a sign of heart failure exacerbation. Symptoms of heart failure include:  Breathing problems or shortness of breath.  Chronic coughing or wheezing.  Fatigue.  Nausea or lack of appetite.  Feeling light-headed.  Confusion or memory loss.  Increased heart rate or irregular heartbeat.  Buildup of fluid in the legs, ankles, feet, or abdomen.  Difficulty breathing when lying down.  How is this diagnosed? This condition is diagnosed based on:  Your symptoms and medical history.  A physical exam.  You may also have tests, including:  Electrocardiogram (ECG). This test measures the electrical activity of your heart.  Echocardiogram. This test uses sound waves to take a picture of your heart to see how well it works.  Blood tests.  Imaging tests, such as: ? Chest X-ray. ? MRI. ? Ultrasound.  Stress test. This test examines how well your heart functions when you exercise. Your heart is monitored while you exercise on a treadmill or exercise bike. If you cannot exercise, medicines may be used to increase your heartbeat in place of exercise.  Cardiac catheterization. During this test, a thin, flexible tube (catheter) is inserted into a blood vessel and threaded up to your heart. This test allows your health care provider to check the arteries that lead to your heart (coronary arteries).  Right  heart catheterization. During this test, the pressure in your heart is measured.  How is this treated? This condition may be treated by:  Adjusting your heart medicines.  Maintaining a healthy lifestyle. This includes: ? Eating a heart-healthy diet that is low in sodium. ? Not using any products that contain nicotine or tobacco, such as cigarettes and e-cigarettes. ? Regular exercise. ? Monitoring your fluid intake. ? Monitoring your weight and reporting changes to your health care provider.  Treating sleep apnea, if you have this condition.  Surgery. This may include: ? Implanting a device that helps both sides of your heart contract at the same time (cardiac resynchronization therapy device). This can help with heart function and relieve heart failure symptoms. ? Implanting a device that can correct heart rhythm problems (implantable cardioverter defibrillator). ? Connecting a device to your heart to help it pump blood (ventricular assist device). ? Heart transplant.  Follow these instructions at home: Medicines  Take over-the-counter and prescription medicines only as told by your health care provider.  Do not stop taking your medicines or change the amount you take. If you are having problems or side effects from your medicines, talk to your health care provider.  If you are having difficulty paying for your medicines, contact a social worker or your clinic. There are many programs to assist with medicine costs.  Talk to your health care provider before starting any new medicines or supplements.  Make sure your health care provider and pharmacist have a list of all the medicines you are taking. Eating and drinking  Avoid drinking alcohol.  Eat a heart-healthy diet as told by your health care provider. This includes: ? Plenty of fruits and vegetables. ? Lean proteins. ? Low-fat dairy. ? Whole grains. ? Foods that are low in sodium. Activity  Exercise regularly as told  by your health care provider. Balance exercise with rest.  Ask your health care provider what activities are safe for you. This includes sexual activity, exercise, and daily tasks at home or work. Lifestyle  Do not use  any products that contain nicotine or tobacco, such as cigarettes and e-cigarettes. If you need help quitting, ask your health care provider.  Maintain a healthy weight. Ask your health care provider what weight is healthy for you.  Consider joining a patient support group. This can help with emotional problems you may have, such as stress and anxiety. General instructions  Talk to your health care provider about flu and pneumonia vaccines.  Keep a list of medicines that you are taking. This may help in emergency situations.  Keep all follow-up visits as told by your health care provider. This is important. Contact a health care provider if:  You have questions about your medicines or you miss a dose.  You feel anxious, depressed, or stressed.  You have swelling in your feet, ankles, legs, or abdomen.  You have shortness of breath during activity or exercise.  You have a cough.  You have a fever.  You have trouble sleeping.  You gain 2-3 lb (1-1.4 kg) in 24 hours or 5 lb (2.3 kg) in a week. Get help right away if:  You have chest pain.  You have shortness of breath while resting.  You have severe fatigue.  You are confused.  You have severe dizziness.  You have a rapid or irregular heartbeat.  You have nausea or you vomit.  You have a cough that is worse at night or you cannot lie flat.  You have a cough that will not go away.  You have severe depression or sadness. Summary  When heart failure symptoms get worse, it is called heart failure exacerbation.  Common causes of this condition include taking medicines incorrectly, infections, and drinking alcohol.  This condition may be treated by adjusting medicines, maintaining a healthy  lifestyle, or surgery.  Do not stop taking your medicines or change the amount you take. If you are having problems or side effects from your medicines, talk to your health care provider. This information is not intended to replace advice given to you by your health care provider. Make sure you discuss any questions you have with your health care provider. Document Released: 01/23/2017 Document Revised: 01/23/2017 Document Reviewed: 01/23/2017 Elsevier Interactive Patient Education  2018 [place].   Heart Failure and Exercise Heart failure is a condition in which the heart does not fill or pump enough blood and oxygen to support your body and its functions. Heart failure is a long-term (chronic) condition. Living with heart failure can be challenging. However, following your health care provider's instructions about a healthy lifestyle may help improve your symptoms. This includes choosing the right exercise plan. Doing daily physical activity is important after a diagnosis of heart failure. You may have some activity restrictions, so talk to your health care provider before doing any exercises. What are the benefits of exercise? Exercise may:  Make your heart muscles stronger.  Lower your blood pressure.  Lower your cholesterol.  Help you lose weight.  Help your bones stay strong.  Improve your blood circulation.  Help your body use oxygen better. This relieves symptoms such as fatigue and shortness of breath.  Help your mental health by lowering the risk of depression and other problems.  Improve your quality of life.  Decrease your chance of hospital admission for heart failure.  What is an exercise plan? An exercise plan is a set of specific exercises and training activities. You will work with your health care provider to create the exercise plan that works for you.  The plan may include:  Different types of exercises and how to do them.  Cardiac rehabilitation  exercises. These are supervised programs that are designed to strengthen your heart.  What are strengthening exercises? Strengthening exercises are a type of physical activity that involves using resistance to improve your muscle strength. Strengthening exercises usually have repetitive motions. These types of exercises can include:  Lifting weights.  Using weight machines.  Using resistance tubes and bands.  Using kettlebells.  Using your body weight, such as doing push-ups or squats.  What are balance exercises? Balance exercises are another type of physical activity. They strengthen the muscles of the back, stomach, and pelvis (core muscles) and improve your balance. They can also lower your risk of falling. These types of exercises can include:  Standing on one leg.  Walking backward, sideways, and in a straight line.  Standing up after sitting, without using your hands.  Shifting your weight from one leg to the other.  Lifting one leg in front of you.  Doing tai chi. This is a type of exercise that uses slow movements and deep breathing.  How can I increase my flexibility? Having better flexibility can keep you from falling. It can also lengthen your muscles, improve your range of motion, and help your joints. You can increase your flexibility by:  Doing tai chi.  Doing yoga.  Stretching.  How much aerobic exercise should I get? Aerobic exercises strengthen your breathing and circulation system and increase your body's use of oxygen. Examples of aerobic exercise include biking, walking, running, and swimming. Talk to your health care provider to find out how much aerobic exercise is safe for you.  To do these exercises:  Start exercising slowly, limiting the amount of time at first. You may need to start with 5 minutes of aerobic exercise every day.  Slowly add more minutes until you can safely do at least 30 minutes of exercise at least 4 days a  week.  Summary  Daily physical activity is important after a diagnosis of heart failure.  Exercise can make your heart muscles stronger. It also offers other benefits that will improve your health.  Talk to your health care provider before doing any exercises. This information is not intended to replace advice given to you by your health care provider. Make sure you discuss any questions you have with your health care provider. Document Released: 01/23/2017 Document Revised: 01/23/2017 Document Reviewed: 01/23/2017 Elsevier Interactive Patient Education  2018 [place].   Heart Failure Medicines Heart failure is a condition in which the heart cannot pump enough blood through the body. This can cause symptoms such as shortness of breath, fatigue, and confusion. There are two types of heart failure:  Heart failure with reduced ejection fraction. In this type, the heart muscle is weak.  Heart failure with preserved ejection fraction. In this type, the heart muscle does not fill with blood properly and may be stiff.  There is no cure for heart failure. However, being treated with medicines and following your health care provider's instructions about a healthy lifestyle can help you stay active, avoid problems, and live longer. Talk to your health care provider about all medicines that you are taking, how often you should take them, and what possible problems (side effects) they may cause. Talk with your health care provider if you have difficulty affording your medicines. What are some common medicines for heart failure? The medicines that are prescribed for you will depend on your  symptoms, the type of heart failure you have, and the cause of your heart failure. In some cases, you may need to take more than one medicine. You will be prescribed the following medicines according to your type of heart failure: Heart failure with reduced ejection  fraction  Beta-blockers.  Angiotensin-converting enzyme (ACE) inhibitors.  Angiotensin II receptor blockers (ARBs).  Angiotensin receptor neprilysin inhibitors (ARNIs).  Aldosterone antagonists.  Diuretics.  Digoxin.  Nitrates. Heart failure with preserved ejection fraction  Medicines to control blood pressure, including: ? Beta-blockers. ? Angiotensin-converting enzyme (ACE) inhibitors. ? Angiotensin II receptor blockers (ARBs).  Diuretics.  Aldosterone antagonists. What should I know about beta-blockers?  These medicines lower your blood pressure and slow your heart rate. This helps to lessen your heart's workload.  They can help to relieve chest pain (angina).  They can help to improve your heart's ability to pump.  They may cause asthma attacks and shortness of breath.  Because these medicines slow your heart rate, it is important not to overwork yourself while exercising. Talk to your health care provider about what your target heart rate should be while you exercise.  These medicines can hide the symptoms of low blood sugar (glucose), which is also called hypoglycemia. If you have diabetes, make sure to check your blood glucose carefully. If you have hypoglycemia, talk to your health care provider about adjusting your medicines.  Beta-blockers may make you feel dizzy or light-headed at first. Do not drive or use heavy machinery when you first start these medicines. Ask your health care provider when it is safe for you to drive. What should I know about ACE inhibitors or ARBs?  These medicines help to widen arteries and veins. This action lowers your blood pressure and lessens the strain on your heart, making it easier for your heart to pump.  They can help to lessen the symptoms of heart failure.  ARBs are often used if a person cannot take ACE inhibitors.  ACE inhibitors may cause a dry cough.  In rare cases, ACE inhibitors and ARBs can cause swelling of the  tongue or lips, other swelling, taste problems, and skin rashes. If these symptoms occur, stop taking the medicines and contact your health care provider.  Do not take ACE inhibitors if you are pregnant or may become pregnant. These medicines can cause health problems in an unborn baby.  These medicines may cause dizziness. You may need regular checkups and blood tests to monitor how they are working. What should I know about ARNIs?  These medicines are a combination of an ARB and another medicine. They lower your blood pressure.  Side effects may include dry cough, dizziness, low blood pressure, and kidney problems.  Do not take ARNIs if you are already taking ACE inhibitors or ARBs.  You may notice increased urination when taking these medicines.  ARNIs can raise the amount of potassium in the blood. Your potassium levels will be monitored regularly by your health care provider. What should I know about aldosterone antagonists?  They help the body to remove excess sodium through urination. This helps to lessen the amount of blood that the heart needs to pump.  They can also help to lower blood pressure and improve the heart's ability to pump blood.  They may cause dizziness, diarrhea, coughing, or flu-like symptoms.  They should not be used if you have type 2 diabetes.  They can raise the amount of potassium in the blood. Your potassium levels will be monitored  regularly by your health care provider.  These medicines can make men's breasts large and tender. What should I know about diuretics?  Diuretics are medicines that help the body get rid of excess fluid through urination. They can also help lessen your heart's workload.  They help to lessen fluid buildup in the lungs, ankles, and feet.  They help to lower your blood pressure.  They can worsen problems with controlling urination (urinary incontinence).  They may cause dizziness, headaches, muscle cramps, and an upset  stomach.  They can cause weak muscles, dry mouth, or confusion. It is important to drink plenty of fluids while taking these medicines, especially while exercising or on hot days. What should I know about digoxin?  Digoxin helps the heart pump more blood efficiently. It also lowers your heart rate.  It can help ease heart failure symptoms and may be used if other medicines do not work.  It can also help with irregular heartbeat (arrhythmia).  It may cause stomach problems, fatigue, headache, drowsiness, or vision problems. What should I know about nitrates?  Nitrates relax the blood vessels and increase oxygen and blood supply to the heart. They also lower the blood pressure.  They are usually taken to lessen chest pain.  They may cause headaches, flushing, or irregular heartbeat. Summary  A healthy lifestyle and treatment with medicine will relieve symptoms of heart failure.  In some cases, you may need to take more than one medicine.  It is important to talk to your health care provider about how often you should take your medicines. Do not skip a dose or change your dosage.  Talk to your heaLth care provider about possible side effects of these medicines. This information is not intended to replace advice given to you by your health care provider. Make sure you discuss any questions you have with your health care provider. Document Released: 01/26/2017 Document Revised: 01/26/2017 Document Reviewed: 01/26/2017 Elsevier Interactive Patient Education  2018 [place] With Heart Failure  Heart failure is a long-term (chronic) condition in which the heart cannot pump enough blood through the body. When this happens, parts of the body do not get the blood and oxygen they need. There is no cure for heart failure at this time, so it is important for you to take good care of yourself and follow the treatment plan set by your health care provider. If you are living with  heart failure, there are ways to help you manage the disease. Follow these instructions at home: Living with heart failure requires you to make changes in your life. Your health care team will teach you about the changes you need to make in order to relieve your symptoms and lower your risk of going to the hospital. Follow the treatment plan as set by your health care provider. Medicines Medicines are important in reducing your heart's workload, slowing the progression of heart failure, and improving your symptoms.  Take over-the-counter and prescription medicines only as told by your health care provider.  Do not stop taking your medicine unless your health care provider tells you to do that.  Do not skip any dose of your medicine.  Refill prescriptions before you run out of medicine. You need your medicines every day.  Eating and drinking  Eat heart-healthy foods. Talk with a dietitian to make an eating plan that is right for you. ? If directed by your health care provider: ? Limit salt (sodium). Lowering your sodium intake may  reduce symptoms of heart failure. Ask a dietitian to recommend heart-healthy seasonings. ? Limit your fluid intake. Fluid restriction may reduce symptoms of heart failure. ? Use low-fat cooking methods instead of frying. Low-fat methods include roasting, grilling, broiling, baking, poaching, steaming, and stir-frying. ? Choose foods that contain no trans fat and are low in saturated fat and cholesterol. Healthy choices include fresh or frozen fruits and vegetables, fish, lean meats, legumes, fat-free or low-fat dairy products, and whole-grain or high-fiber foods.  Limit alcohol intake to no more than 1 drink a day for nonpregnant women and 2 drinks a day for men. One drink equals 12 oz of beer, 5 oz of wine, or 1 oz of hard liquor. ? Drinking more than that is harmful to your heart. Tell your health care provider if you drink alcohol several times a week. ? Talk  with your health care provider about whether any level of alcohol use is safe for you. Activity  Ask your health care provider about attending cardiac rehabilitation. These programs include aerobic physical activity, which provides many benefits for your heart.  If no cardiac rehabilitation program is available, ask your health care provider what aerobic exercises are safe for you to do. Lifestyle Make the lifestyle changes recommended by your health care provider. In general:  Lose weight if your health care provider tells you to do that. Weight loss may reduce symptoms of heart failure.  Do not use any products that contain nicotine or tobacco, such as cigarettes or e-cigarettes. If you need help quitting, ask your health care provider.  Do not use street (illegal) drugs.  Return to your normal activities as told by your health care provider. Ask your health care provider what activities are safe for you.  General instructions  Make sure you weigh yourself every day to track your weight. Rapid weight gain may indicate an increase in fluid in your body and may increase the workload of your heart. ? Weigh yourself every morning. Do this after you urinate but before you eat breakfast. ? Wear the same type of clothing, without shoes, each time you weigh yourself. ? Weigh yourself on the same scale and in the same spot each time.  Living with chronic heart failure often leads to emotions such as fear, stress, anxiety, and depression. If you feel any of these emotions and need help coping, contact your health care provider. Other ways to get help include: ? Talking to friends and family members about your condition. They can give you support and guidance. Explain your symptoms to them and, if comfortable, invite them to attend appointments or rehabilitation with you. ? Joining a support group for people with chronic heart failure. Talking with other people who have the same symptoms may give  you new ways of coping with your disease and your emotions.  Stay up to date with your shots (vaccines). Staying current on pneumococcal and influenza vaccines is especially important in preventing germs from attacking your airways (respiratory infections).  Keep all follow-up visits as told by your health care provider. This is important. How to recognize changes in your condition You and your family members need to know what changes to watch for in your condition. Watch for the following changes and report them to your health care provider:  Sudden weight gain. Ask your health care provider what amount of weight gain to report.  Shortness of breath: ? Feeling short of breath while at rest, with no exercise or activity that required  great effort. ? Feeling breathless with activity.  Swelling of your lower legs or ankles.  Difficulty sleeping: ? You wake up feeling short of breath. ? You have to use more pillows to raise your head in order to sleep.  Frequent, dry, hacking cough.  Loss of appetite.  Feeling more tired all the time.  Depression or feelings of sadness or hopelessness.  Bloating in the stomach.  Where to find more information  Local support groups. Ask your health care provider about groups near you.  The American Heart Association: www.heart.org Contact a health care provider if:  You have a rapid weight gain.  You have increasing shortness of breath that is unusual for you.  You are unable to participate in your usual physical activities.  You tire easily.  You cough more than normal, especially with physical activity.  You have any swelling or more swelling in areas such as your hands, feet, ankles, or abdomen.  You feel like your heart is beating quickly (palpitations).  You become dizzy or light-headed when you stand up. Get help right away if:  You have difficulty breathing.  You notice or your family notices a change in your awareness,  such as having trouble staying awake or having difficulty with concentration.  You have pain or discomfort in your chest.  You have an episode of fainting (syncope). Summary  There is no cure for heart failure, so it is important for you to take good care of yourself and follow the treatment plan set by your health care provider.  Medicines are important in reducing your heart's workload, slowing the progression of heart failure, and improving your symptoms.  Living with chronic heart failure often leads to emotions such as fear, stress, anxiety, and depression. If you are feeling any of these emotions and need help coping, contact your health care provider. This information is not intended to replace advice given to you by your health care provider. Make sure you discuss any questions you have with your health care provider. Document Released: 01/24/2017 Document Revised: 01/24/2017 Document Reviewed: 01/24/2017 Elsevier Interactive Patient Education  2018 [employer].   Preventing Heart Failure Heart failure is a condition in which the heart has trouble pumping blood. This may mean that the heart cannot pump enough blood out to the body, or that the heart does not fill up with enough blood. Either of those problems can lead to symptoms such as fatigue, trouble breathing, and swelling throughout the body. This is a common medical condition that affects not only the heart, but the entire body. Making certain nutrition and lifestyle changes can help you prevent heart failure and avoid serious health problems. What nutrition changes can be made?  If you are overweight or obese, reduce how many calories you eat each day so that you lose weight. Work with your health care provider or a diet and nutrition specialist (dietitian) to determine how many calories you need each day.  Eat foods that are low in salt (sodium). Avoid adding extra salt to foods.  Eat a well-balanced diet that includes a  lot of: ? Fresh fruits and vegetables. ? Whole grains. ? Lean meats. ? Beans. ? Fat-free or low-fat dairy products.  Avoid foods that contain a lot of: ? Trans fats. ? Saturated fats. ? Sugar. ? Cholesterol. What lifestyle changes can be made?  Do not use any products that contain nicotine or tobacco, such as cigarettes and e-cigarettes. If you need help quitting or reducing how  much you smoke, ask your health care provider.  Stop using alcohol, or limit alcohol intake to no more than 1 drink a day for nonpregnant women and 2 drinks a day for men. One drink equals 12 oz of beer, 5 oz of wine, or 1 oz of hard liquor.  Exercise for at least 150 minutes each week, or as much as told by your health care provider. ? Do moderate-intensity exercise, such as brisk walking, bicycling, or water aerobics. ? Ask your health care provider which activities are safe for you.  See a health care provider regularly for screening and wellness checks. Know your heart health indicators, such as: ? Blood pressure. ? Cholesterol levels. ? Blood sugar (glucose) levels. ? Weight and BMI.  If you have diabetes, manage your condition and follow your treatment plan as instructed.  Try to get 7-9 hours of sleep each night. To help with sleep: ? Keep your bedroom cool and dark. ? Do not eat a heavy meal during the hour before you go to bed. ? Do not drink alcohol or caffeinated drinks before bed. ? Avoid screen time before bedtime. This means avoiding television, computers, tablets, and cell phones.  Find ways to relax and manage stress. These may include: ? Breathing exercises. ? Meditation. ? Yoga. ? Listening to music. Why are these changes important?  A well-balanced diet with the appropriate amount of calories can keep your body weight at a healthy level, which reduces strain on your heart.  A low-sodium diet can help keep your blood pressure in a normal range and keep your blood vessels working  properly.  Quitting smoking and limiting alcohol intake can reduce harmful effects that these substances have on your heart and blood vessels.  Regular exercise can keep your heart strong so it can pump blood normally.  Managing diabetes helps your blood circulate and can help you maintain a healthy weight.  Managing stress helps to reduce the risk of high blood pressure and heart problems. What can happen if changes are not made? Heart failure can cause very serious problems that may get worse over time, such as:  Extreme fatigue during normal physical activities.  Shortness of breath or trouble breathing.  Swelling in your abdomen, legs, ankles, feet, or neck.  Fluid buildup throughout the body.  Weight gain.  Cough.  Frequent urination.  What can I do to lower my risk? You may be able to lower your risk of heart failure by:  Losing weight or keeping your weight under control.  Working with your health care provider to manage your: ? Cholesterol. ? Blood pressure. ? Diabetes, if this applies.  Eating a healthy diet.  Exercising regularly.  Avoiding unhealthy habits, such as smoking, drinking, or using drugs.  Getting plenty of sleep.  Managing your stress.  How is this treated? Heart failure cannot be cured except by heart transplant, but treatment can help to improve your quality of life. Treatment may include:  Medicines to help: ? Lower blood pressure. ? Remove excess sodium from your body. ? Relax blood vessels. ? Improve heart function. ? Control other symptoms of heart failure.  Surgery to open blocked coronary arteries or repair damaged heart valves.  Implantation of a biventricular pacemaker to improve heart muscle function (cardiac resynchronization therapy). This device paces both the right ventricle and left ventricle.  Implantation of a device to treat serious abnormal heart rhythms (implantable cardioverter defibrillator,  ICD).  Implantation of a mechanical heart pump to improve  the pumping ability of your heart (left ventricular assist device, LVAD).  Heart transplant. This treatment is considered for certain people who do not improve with other treatments.  Where to find more information:  National Heart, Lung, and Blood Institute: ClickDebate.gl  Centers for Disease Control and Prevention: LawyerNetworking.com.cy  [facility]: https://www.[place].info/  American Heart Association: ReligiousCamps.at.jsp Contact a health care provider if:  You have rapid weight gain.  You have increasing shortness of breath that is unusual for you.  You tire easily, or you are unable to participate in your usual activities.  You cough more than normal, especially with physical activity.  You have any swelling or more swelling in areas such as your hands, feet, ankles, or abdomen. Summary  Heart failure can be prevented by making changes to your diet and your lifestyle.  It is important to eat a healthy diet, manage your weight, exercise regularly, manage stress, avoid drugs and alcohol, and keep your cholesterol and blood pressure under control.  Heart failure can cause very serious problems over time. This information is not intended to replace advice given to you by your health care provider. Make sure you discuss any questions you have with your health care provider. Document Released: 05/02/2016 Document Revised: 11/28/2016 Document Reviewed: 05/02/2016 Elsevier Interactive Patient Education  2018 [employer].

## 2018-03-10 NOTE — Discharge Summary (Signed)
Physician Discharge Summary  Erica [name] IRW:[id number] DOB: [birth year] DOA: 03/09/2018  PCP: Iona Beard, MD  Admit date: 03/09/2018 Discharge date: 03/10/2018  Admitted From: Home  Disposition: Home   Recommendations for Outpatient Follow-up:  1. Follow up with PCP in 1 weeks 2. Follow up with cardiology in 2 weeks 3. Please obtain BMP/CBC in 1-2 weeks  Discharge Condition: STABLE   CODE STATUS: FULL    Brief Hospitalization Summary: Please see all hospital notes, images, labs for full details of the hospitalization. HPI: Erica [name] is a 67 y.o. female with history of preserved EF Diastolic CHF, COPD, chronic active smoker recently quit 3 weeks ago presented to ED earlier today with progressive shortness of breath and nonproductive cough that started last night.  No fever or chills.  No chest pain.  Pt has had increasing peripheral edema noted over last several weeks. The edema was mostly noticed in the legs, feet and abdomen.  Pt has been using a home nebulizer with little improvement in symptoms.  Pt claims that she has been taking her medications and respiratory medications as prescribed.  She has been taking her lasix as prescribed.  She remains on chronic supplemental oxygen at home at 2L/min.   She has diabetes mellitus and chronic back pain but reports that they have been stable.  She was treated with nebs and solumedrol in the ED with only minimal improvement in symptoms.  Her chest xray confirms pulmonary edema.  She is being admitted for acute diastolic CHF exacerbation causing acute respiratory distress with hypoxia.    The patient was admitted for acute diastolic CHF exacerbation to telemetry monitored bed for IV diuresis with Lasix.  The patient reports that she has diuresed fairly well overnight and reports that she feels much better.  The edema in the legs has resolved.  She says that she is breathing much better.  She is asking to go home today.  She does not want to  remain in the hospital for further treatment at this time.  The patient has been ambulating.  The patient is on chronic home oxygen.  She was also treated for mild acute COPD exacerbation and has been responding well to nebulizer treatments.  Her blood pressures were initially very elevated but have improved with diuresis.  Given that the patient is adamant that she go home today and because she does have close outpatient follow-up with the primary care physician and a cardiologist we will discharge her home to follow-up with them in the next 1 to 2 weeks.  The patient was given instructions to return if symptoms come back or worsen or new problems develop.  The patient verbalized understanding.  I did try to get the patient to stay longer but she declined.  Discharge Diagnoses:  Principal Problem:   Acute diastolic (congestive) heart failure (HCC) Active Problems:   Chronic pain syndrome   Hypertension   Chronic respiratory failure (HCC)   GERD (gastroesophageal reflux disease)   Tobacco abuse   Type 2 diabetes mellitus with hyperglycemia (HCC)   Iron deficiency anemia due to chronic blood loss   COPD with acute exacerbation (HCC)   Hyperlipidemia   CKD (chronic kidney disease)  Discharge Instructions: Discharge Instructions    (HEART FAILURE PATIENTS) Call MD:  Anytime you have any of the following symptoms: 1) 3 pound weight gain in 24 hours or 5 pounds in 1 week 2) shortness of breath, with or without a dry hacking cough 3) swelling in  the hands, feet or stomach 4) if you have to sleep on extra pillows at night in order to breathe.   Complete by:  As directed    Call MD for:  difficulty breathing, headache or visual disturbances   Complete by:  As directed    Call MD for:  extreme fatigue   Complete by:  As directed    Call MD for:  persistant dizziness or light-headedness   Complete by:  As directed    Diet - low sodium heart healthy   Complete by:  As directed    Increase activity  slowly   Complete by:  As directed      Allergies as of 03/10/2018   No Known Allergies     Medication List    STOP taking these medications   famotidine 20 MG tablet Commonly known as:  PEPCID     TAKE these medications   amLODipine 10 MG tablet Commonly known as:  NORVASC Take 1 tablet by mouth daily.   atorvastatin 80 MG tablet Commonly known as:  LIPITOR Take 80 mg by mouth daily.   budesonide-formoterol 160-4.5 MCG/ACT inhaler Commonly known as:  SYMBICORT Inhale 2 puffs into the lungs 2 (two) times daily.   buPROPion 150 MG 12 hr tablet Commonly known as:  WELLBUTRIN SR Take 150 mg by mouth 2 (two) times daily.   cetirizine 10 MG tablet Commonly known as:  ZYRTEC Take 1 tablet by mouth daily.   doxycycline 100 MG capsule Commonly known as:  VIBRAMYCIN Take 1 capsule (100 mg total) by mouth 2 (two) times daily for 7 days.   ferrous sulfate 325 (65 FE) MG EC tablet Take 1 tablet (325 mg total) by mouth 2 (two) times daily.   furosemide 40 MG tablet Commonly known as:  LASIX Take 1 tablet (40 mg total) by mouth 2 (two) times daily. What changed:  when to take this   GNP ASPIRIN LOW DOSE 81 MG EC tablet Generic drug:  aspirin Take 2 tablets by mouth daily.   ipratropium-albuterol 0.5-2.5 (3) MG/3ML Soln Commonly known as:  DUONEB Take 3 mLs by nebulization every 6 (six) hours as needed (for shortness of breath).   LINZESS 145 MCG Caps capsule Generic drug:  linaclotide Take 145 mcg by mouth daily.   metoprolol tartrate 25 MG tablet Commonly known as:  LOPRESSOR Take 0.5 tablets (12.5 mg total) by mouth 2 (two) times daily.   omega-3 acid ethyl esters 1 g capsule Commonly known as:  LOVAZA Take 2 g by mouth 2 (two) times daily.   ONGLYZA 5 MG Tabs tablet Generic drug:  saxagliptin HCl Take 5 mg by mouth daily. Reported on 09/22/2015   OXYGEN Inhale 2 L into the lungs continuous.   pantoprazole 40 MG tablet Commonly known as:  PROTONIX Take  1 tablet (40 mg total) by mouth daily.   potassium chloride SA 20 MEQ tablet Commonly known as:  K-DUR,KLOR-CON Take 2 tablets (40 mEq total) by mouth daily.   predniSONE 20 MG tablet Commonly known as:  DELTASONE Take 3 PO QAM x3days, 2 PO QAM x3days, 1 PO QAM x3days   RANITIDINE ACID REDUCER PO   SPIRIVA HANDIHALER 18 MCG inhalation capsule Generic drug:  tiotropium Take 1 puff by mouth daily.   valsartan-hydrochlorothiazide 160-25 MG tablet Commonly known as:  DIOVAN-HCT Take 1 tablet by mouth daily.      Follow-up Information    Iona Beard, MD. Schedule an appointment as soon as possible for  a visit in 1 week(s).   Specialty:  Family Medicine Contact information: [address] [place] [address] [phone]        Arnoldo Lenis, MD. Schedule an appointment as soon as possible for a visit in 2 week(s).   Specialty:  Cardiology Contact information: [address] [place] [address] [phone]          No Known Allergies Allergies as of 03/10/2018   No Known Allergies     Medication List    STOP taking these medications   famotidine 20 MG tablet Commonly known as:  PEPCID     TAKE these medications   amLODipine 10 MG tablet Commonly known as:  NORVASC Take 1 tablet by mouth daily.   atorvastatin 80 MG tablet Commonly known as:  LIPITOR Take 80 mg by mouth daily.   budesonide-formoterol 160-4.5 MCG/ACT inhaler Commonly known as:  SYMBICORT Inhale 2 puffs into the lungs 2 (two) times daily.   buPROPion 150 MG 12 hr tablet Commonly known as:  WELLBUTRIN SR Take 150 mg by mouth 2 (two) times daily.   cetirizine 10 MG tablet Commonly known as:  ZYRTEC Take 1 tablet by mouth daily.   doxycycline 100 MG capsule Commonly known as:  VIBRAMYCIN Take 1 capsule (100 mg total) by mouth 2 (two) times daily for 7 days.   ferrous sulfate 325 (65 FE) MG EC tablet Take 1 tablet (325 mg total) by mouth 2 (two) times daily.    furosemide 40 MG tablet Commonly known as:  LASIX Take 1 tablet (40 mg total) by mouth 2 (two) times daily. What changed:  when to take this   GNP ASPIRIN LOW DOSE 81 MG EC tablet Generic drug:  aspirin Take 2 tablets by mouth daily.   ipratropium-albuterol 0.5-2.5 (3) MG/3ML Soln Commonly known as:  DUONEB Take 3 mLs by nebulization every 6 (six) hours as needed (for shortness of breath).   LINZESS 145 MCG Caps capsule Generic drug:  linaclotide Take 145 mcg by mouth daily.   metoprolol tartrate 25 MG tablet Commonly known as:  LOPRESSOR Take 0.5 tablets (12.5 mg total) by mouth 2 (two) times daily.   omega-3 acid ethyl esters 1 g capsule Commonly known as:  LOVAZA Take 2 g by mouth 2 (two) times daily.   ONGLYZA 5 MG Tabs tablet Generic drug:  saxagliptin HCl Take 5 mg by mouth daily. Reported on 09/22/2015   OXYGEN Inhale 2 L into the lungs continuous.   pantoprazole 40 MG tablet Commonly known as:  PROTONIX Take 1 tablet (40 mg total) by mouth daily.   potassium chloride SA 20 MEQ tablet Commonly known as:  K-DUR,KLOR-CON Take 2 tablets (40 mEq total) by mouth daily.   predniSONE 20 MG tablet Commonly known as:  DELTASONE Take 3 PO QAM x3days, 2 PO QAM x3days, 1 PO QAM x3days   RANITIDINE ACID REDUCER PO   SPIRIVA HANDIHALER 18 MCG inhalation capsule Generic drug:  tiotropium Take 1 puff by mouth daily.   valsartan-hydrochlorothiazide 160-25 MG tablet Commonly known as:  DIOVAN-HCT Take 1 tablet by mouth daily.       Procedures/Studies: Dg Chest Port 1 View  Result Date: 03/10/2018 CLINICAL DATA:  Shortness of breath. Acute diastolic congestive heart failure. EXAM: PORTABLE CHEST 1 VIEW COMPARISON:  Radiograph yesterday. FINDINGS: Stable cardiomegaly. Slight progression in pulmonary edema from prior exam. Chronic blunting of the right costophrenic angle, limited assessment for pleural effusion. No pneumothorax. Calcified pleural  plaques, better  characterized on prior chest CT. IMPRESSION: Slight progression in pulmonary edema from yesterday. Cardiomegaly is unchanged. Electronically Signed   By: Jeb Levering M.D.   On: 03/10/2018 06:45   Dg Chest Port 1 View  Result Date: 03/09/2018 CLINICAL DATA:  Shortness of breath since last night. History of COPD. EXAM: PORTABLE CHEST 1 VIEW COMPARISON:  Chest x-rays dated 01/23/2018 and 12/07/2017. FINDINGS: Stable cardiomegaly. Aortic atherosclerosis. Diffuse bilateral interstitial prominence, increased compared to the most recent chest x-ray of 01/23/2018 indicating edema superimposed on chronic interstitial lung disease. Probable small bilateral pleural effusions. Chronic calcified pleural plaque within the upper RIGHT hemithorax. No acute or suspicious osseous finding. IMPRESSION: 1. Bilateral interstitial edema superimposed on chronic interstitial lung disease, consistent with active CHF/volume overload. 2. Stable cardiomegaly. 3. Calcified pleural plaques indicating previous asbestos exposure, better demonstrated on earlier chest CT of 12/19/2017. Electronically Signed   By: Franki Cabot M.D.   On: 03/09/2018 14:14      Subjective: Pt says she feels better and she wants to go home today.   Discharge Exam: Vitals:   03/10/18 0556 03/10/18 0802  BP: (!) 150/65   Pulse: 80   Resp: 16   Temp: 97.9 F (36.6 C)   SpO2: 95% 91%   Vitals:   03/10/18 0411 03/10/18 0500 03/10/18 0556 03/10/18 0802  BP:   (!) 150/65   Pulse:   80   Resp:   16   Temp:   97.9 F (36.6 C)   TempSrc:   Oral   SpO2: 94%  95% 91%  Weight:  80.4 kg (177 lb 4.8 oz)    Height:       General: Pt is alert, awake, not in acute distress Cardiovascular: RRR, S1/S2 +, no rubs, no gallops Respiratory: better air movement, rare expiratory wheezing, no rhonchi Abdominal: Soft, NT, ND, bowel sounds + Extremities: trace pretibial edema, no cyanosis   The results of significant diagnostics from this hospitalization  (including imaging, microbiology, ancillary and laboratory) are listed below for reference.     Microbiology: No results found for this or any previous visit (from the past 240 hour(s)).   Labs: BNP (last 3 results) Recent Labs    09/19/17 1348 12/07/17 1218 12/17/17 1738  BNP 244.0* 155.0* 683.4*   Basic Metabolic Panel: Recent Labs  Lab 03/09/18 1348 03/10/18 0600  NA 140 144  K 3.4* 3.6  CL 103 101  CO2 30 31  GLUCOSE 137* 166*  BUN 16 23*  CREATININE 1.30* 1.52*  CALCIUM 8.3* 8.2*  MG  --  1.7   Liver Function Tests: Recent Labs  Lab 03/09/18 1348  AST 12*  ALT 11*  ALKPHOS 60  BILITOT 0.7  PROT 6.6  ALBUMIN 3.7   No results for input(s): LIPASE, AMYLASE in the last 168 hours. No results for input(s): AMMONIA in the last 168 hours. CBC: Recent Labs  Lab 03/09/18 1348 03/10/18 0600  WBC 7.0 5.1  NEUTROABS 5.9 4.6  HGB 11.9* 12.3  HCT 38.2 39.2  MCV 84.0 83.1  PLT 247 249   Cardiac Enzymes: Recent Labs  Lab 03/09/18 1348  TROPONINI <0.03   BNP: Invalid input(s): POCBNP CBG: Recent Labs  Lab 03/09/18 1633 03/09/18 2148 03/10/18 0723  GLUCAP 175* 130* 147*   D-Dimer No results for input(s): DDIMER in the last 72 hours. Hgb A1c Recent Labs    03/09/18 1348  HGBA1C 5.6   Lipid Profile No results for input(s): CHOL, HDL, LDLCALC,  TRIG, CHOLHDL, LDLDIRECT in the last 72 hours. Thyroid function studies No results for input(s): TSH, T4TOTAL, T3FREE, THYROIDAB in the last 72 hours.  Invalid input(s): FREET3 Anemia work up No results for input(s): VITAMINB12, FOLATE, FERRITIN, TIBC, IRON, RETICCTPCT in the last 72 hours. Urinalysis    Component Value Date/Time   COLORURINE YELLOW 03/14/2013 1847   APPEARANCEUR CLEAR 03/14/2013 1847   LABSPEC 1.025 03/14/2013 1847   PHURINE 6.0 03/14/2013 1847   GLUCOSEU NEGATIVE 03/14/2013 1847   HGBUR NEGATIVE 03/14/2013 1847   BILIRUBINUR NEGATIVE 03/14/2013 1847   KETONESUR NEGATIVE  03/14/2013 1847   PROTEINUR NEGATIVE 03/14/2013 1847   UROBILINOGEN 0.2 03/14/2013 1847   NITRITE NEGATIVE 03/14/2013 1847   LEUKOCYTESUR NEGATIVE 03/14/2013 1847   Sepsis Labs Invalid input(s): PROCALCITONIN,  WBC,  LACTICIDVEN Microbiology No results found for this or any previous visit (from the past 240 hour(s)).  Time coordinating discharge:   SIGNED:  Irwin Brakeman, MD  [facility] 03/10/2018, 10:52 AM Pager [phone]  If 7PM-7AM, please contact night-coverage [email or web address] Password [email or web address]

## 2018-03-27 DIAGNOSIS — J449 Chronic obstructive pulmonary disease, unspecified: Secondary | ICD-10-CM | POA: Diagnosis not present

## 2018-04-03 DIAGNOSIS — Z9981 Dependence on supplemental oxygen: Secondary | ICD-10-CM | POA: Diagnosis not present

## 2018-04-03 DIAGNOSIS — Z6828 Body mass index (BMI) 28.0-28.9, adult: Secondary | ICD-10-CM | POA: Diagnosis not present

## 2018-04-03 DIAGNOSIS — E1122 Type 2 diabetes mellitus with diabetic chronic kidney disease: Secondary | ICD-10-CM | POA: Diagnosis not present

## 2018-04-03 DIAGNOSIS — J449 Chronic obstructive pulmonary disease, unspecified: Secondary | ICD-10-CM | POA: Diagnosis not present

## 2018-04-03 DIAGNOSIS — N183 Chronic kidney disease, stage 3 (moderate): Secondary | ICD-10-CM | POA: Diagnosis not present

## 2018-04-05 ENCOUNTER — Encounter: Payer: Self-pay | Admitting: Cardiology

## 2018-04-05 ENCOUNTER — Ambulatory Visit (INDEPENDENT_AMBULATORY_CARE_PROVIDER_SITE_OTHER): Payer: Medicare HMO | Admitting: Cardiology

## 2018-04-05 VITALS — BP 132/80 | HR 78 | Ht 64.0 in | Wt 170.0 lb

## 2018-04-05 DIAGNOSIS — R0602 Shortness of breath: Secondary | ICD-10-CM | POA: Diagnosis not present

## 2018-04-05 DIAGNOSIS — I10 Essential (primary) hypertension: Secondary | ICD-10-CM

## 2018-04-05 DIAGNOSIS — E782 Mixed hyperlipidemia: Secondary | ICD-10-CM

## 2018-04-05 NOTE — Patient Instructions (Signed)
Medication Instructions:  Your physician recommends that you continue on your current medications as directed. Please refer to the Current Medication list given to you today.   Labwork: 2 weeks   bmet Magnesium   Testing/Procedures: Your physician has requested that you have a dobutamine myoview. For furth information please visit [email or web address]. Please follow instruction sheet, as given.    Follow-Up: Your physician recommends that you schedule a follow-up appointment in: 4 months  4  Any Other Special Instructions Will Be Listed Below (If Applicable).     If you need a refill on your cardiac medications before your next appointment, please call your pharmacy.

## 2018-04-05 NOTE — Progress Notes (Signed)
Clinical Summary Ms. Penny is a 67 y.o.female seen today for follow up of the following medical problems. I had prevoiusly seen her in the hospital, this is our first clinic visit [email or web address]    1. SOB - admit 11/2017 with fever, leukocytosis, severe hypoxia to 70s - mild trop elevation at that time, peak 0.53. Thought to be demand ischemia. Based on risk factors plans for outpatient stress pending symptoms.  - 11/2017 echo LVEF 60-65%, no WMAs  - no recent chest pain. Chronic SOB/DOE has increased   2. Chronic diastolic HF - admit 0/9811 with fluid overload - diuresed with improved symptoms - occasional swelling   3. COPD - on home O2 2L  4. Hypertension - compliant with meds  5. Hyperlipidemia - compliant with meds   6. DM2 - followed by pcp   7. CKD 3 - last Cr 1.52 in 02/2018   8. PSVT  9 GI bleed - admit 01/2018 with GI bleed. Hgb 7.3 down from 9. Had heme +stools.  - EGD normal. Bleed thought due to AVMs. Transfused 2 units pRBC  Past Medical History:  Diagnosis Date  . Arthritis   . CHF (congestive heart failure) ([place])   . Chronic back pain   . COPD (chronic obstructive pulmonary disease) (HCC)    2L home O2  . Diabetes mellitus without complication ([place])   . Hyperlipidemia   . Hypertension   . Stroke (cerebrum) (HCC)      No Known Allergies   Current Outpatient Medications  Medication Sig Dispense Refill  . amLODipine (NORVASC) 10 MG tablet Take 1 tablet by mouth daily.    Marland Kitchen atorvastatin (LIPITOR) 80 MG tablet Take 80 mg by mouth daily.     . budesonide-formoterol (SYMBICORT) 160-4.5 MCG/ACT inhaler Inhale 2 puffs into the lungs 2 (two) times daily. 1 Inhaler 3  . buPROPion (WELLBUTRIN SR) 150 MG 12 hr tablet Take 150 mg by mouth 2 (two) times daily.    . cetirizine (ZYRTEC) 10 MG tablet Take 1 tablet by mouth daily.    . ferrous sulfate 325 (65 FE) MG EC tablet Take 1 tablet (325 mg total) by mouth 2 (two) times daily. 60 tablet 2  .  furosemide (LASIX) 40 MG tablet Take 1 tablet (40 mg total) by mouth 2 (two) times daily. 60 tablet 0  . GNP ASPIRIN LOW DOSE 81 MG EC tablet Take 2 tablets by mouth daily.  5  . ipratropium-albuterol (DUONEB) 0.5-2.5 (3) MG/3ML SOLN Take 3 mLs by nebulization every 6 (six) hours as needed (for shortness of breath).    . Linaclotide (LINZESS) 145 MCG CAPS capsule Take 145 mcg by mouth daily.    . metoprolol tartrate (LOPRESSOR) 25 MG tablet Take 0.5 tablets (12.5 mg total) by mouth 2 (two) times daily. 60 tablet 1  . omega-3 acid ethyl esters (LOVAZA) 1 g capsule Take 2 g by mouth 2 (two) times daily.     . OXYGEN Inhale 2 L into the lungs continuous.    . pantoprazole (PROTONIX) 40 MG tablet Take 1 tablet (40 mg total) by mouth daily. 30 tablet 2  . potassium chloride SA (K-DUR,KLOR-CON) 20 MEQ tablet Take 2 tablets (40 mEq total) by mouth daily. 60 tablet 0  . predniSONE (DELTASONE) 20 MG tablet Take 3 PO QAM x3days, 2 PO QAM x3days, 1 PO QAM x3days 18 tablet 0  . raNITIdine HCl (RANITIDINE ACID REDUCER PO)     . saxagliptin HCl (ONGLYZA) 5 MG  TABS tablet Take 5 mg by mouth daily. Reported on 09/22/2015    . SPIRIVA HANDIHALER 18 MCG inhalation capsule Take 1 puff by mouth daily.    . valsartan-hydrochlorothiazide (DIOVAN-HCT) 160-25 MG tablet Take 1 tablet by mouth daily.     No current facility-administered medications for this visit.      Past Surgical History:  Procedure Laterality Date  . ABDOMINAL HYSTERECTOMY    . colonoscopy  2009   Dr. Gala Romney: cluster of diminutive rectal polyps and 2 diminutive polyps in rectosigmoid junction, hyperplastic  . COLONOSCOPY WITH PROPOFOL N/A 10/18/2017   Procedure: COLONOSCOPY WITH PROPOFOL;  Surgeon: Daneil Dolin, MD;  Location: [facility];  Service: Endoscopy;  Laterality: N/A;  9:30am  . ESOPHAGOGASTRODUODENOSCOPY N/A 01/25/2018   Procedure: ESOPHAGOGASTRODUODENOSCOPY (EGD);  Surgeon: Danie Binder, MD;  Location: [facility];  Service:  Endoscopy;  Laterality: N/A;  . ESOPHAGOGASTRODUODENOSCOPY (EGD) WITH PROPOFOL N/A 08/29/2017   Dr. Gala Romney with propofol: small hiatal hernia. 4cm fundal diverticulum.   Marland Kitchen GIVENS CAPSULE STUDY N/A 01/25/2018   Procedure: GIVENS CAPSULE STUDY;  Surgeon: Danie Binder, MD;  Location: [facility];  Service: Endoscopy;  Laterality: N/A;  . left hand middle finger     in an accident at work, amputated     No Known Allergies    Family History  Problem Relation Age of Onset  . CVA Mother 74  . Cancer Neg Hx   . Colon cancer Neg Hx      Social History Ms. Bacot reports that she quit smoking about 6 months ago. Her smoking use included cigarettes. She has a 52.00 pack-year smoking history. She has never used smokeless tobacco. Ms. Furrow reports that she does not drink alcohol.   Review of Systems CONSTITUTIONAL: No weight loss, fever, chills, weakness or fatigue.  HEENT: Eyes: No visual loss, blurred vision, double vision or yellow sclerae.No hearing loss, sneezing, congestion, runny nose or sore throat.  SKIN: No rash or itching.  CARDIOVASCULAR: per hpi RESPIRATORY: per hpi GASTROINTESTINAL: No anorexia, nausea, vomiting or diarrhea. No abdominal pain or blood.  GENITOURINARY: No burning on urination, no polyuria NEUROLOGICAL: No headache, dizziness, syncope, paralysis, ataxia, numbness or tingling in the extremities. No change in bowel or bladder control.  MUSCULOSKELETAL: No muscle, back pain, joint pain or stiffness.  LYMPHATICS: No enlarged nodes. No history of splenectomy.  PSYCHIATRIC: No history of depression or anxiety.  ENDOCRINOLOGIC: No reports of sweating, cold or heat intolerance. No polyuria or polydipsia.  Marland Kitchen   Physical Examination Vitals:   04/05/18 1400 04/05/18 1401  BP: 128/78 132/80  Pulse: 78   SpO2: (!) 89%    Vitals:   04/05/18 1400  Weight: 170 lb (77.1 kg)  Height: 5\' 4"  (1.626 m)    Gen: resting comfortably, no acute distress HEENT: no  scleral icterus, pupils equal round and reactive, no palptable cervical adenopathy,  CV: RRR, no mr/g, no jvd Resp: Clear to auscultation bilaterally GI: abdomen is soft, non-tender, non-distended, normal bowel sounds, no hepatosplenomegaly MSK: extremities are warm, no edema.  Skin: warm, no rash Neuro:  no focal deficits Psych: appropriate affect   Diagnostic Studies  11/2017 echo Study Conclusions  - Limited echo to evaluate LV function. - Left ventricle: The cavity size was normal. Wall thickness was   normal. Systolic function was normal. The estimated ejection   fraction was in the range of 60% to 65%. Wall motion was normal;   there were no regional wall  motion abnormalities. - Aortic valve: Valve area (VTI): 2.17 cm^2. Valve area (Vmax):   2.14 cm^2. - Mitral valve: There was mild regurgitation.  Assessment and Plan  1. SOB - recent symptoms as reported above. unlcear if ischemic component, prior admissoins with trop elevations in setting of systmic illness - plan for dobutmaine nuclear stress to further evaluate  2. HTN - at goal, conitnue current meds  3. Hyperlipidemia - continue high dose statin in setting of DM2      Arnoldo Lenis, M.D.

## 2018-04-11 ENCOUNTER — Emergency Department (HOSPITAL_COMMUNITY): Payer: Medicare HMO

## 2018-04-11 ENCOUNTER — Emergency Department (HOSPITAL_COMMUNITY)
Admission: EM | Admit: 2018-04-11 | Discharge: 2018-04-11 | Discharge status: Against Medical Advice | Payer: Medicare HMO | Attending: Emergency Medicine | Admitting: Emergency Medicine

## 2018-04-11 ENCOUNTER — Encounter (HOSPITAL_COMMUNITY): Payer: Self-pay | Admitting: Emergency Medicine

## 2018-04-11 ENCOUNTER — Other Ambulatory Visit: Payer: Self-pay

## 2018-04-11 DIAGNOSIS — R0602 Shortness of breath: Secondary | ICD-10-CM | POA: Insufficient documentation

## 2018-04-11 DIAGNOSIS — Z7901 Long term (current) use of anticoagulants: Secondary | ICD-10-CM | POA: Diagnosis not present

## 2018-04-11 DIAGNOSIS — J449 Chronic obstructive pulmonary disease, unspecified: Secondary | ICD-10-CM | POA: Diagnosis not present

## 2018-04-11 DIAGNOSIS — E119 Type 2 diabetes mellitus without complications: Secondary | ICD-10-CM | POA: Insufficient documentation

## 2018-04-11 DIAGNOSIS — I110 Hypertensive heart disease with heart failure: Secondary | ICD-10-CM | POA: Diagnosis not present

## 2018-04-11 DIAGNOSIS — I509 Heart failure, unspecified: Secondary | ICD-10-CM | POA: Diagnosis not present

## 2018-04-11 DIAGNOSIS — Z87891 Personal history of nicotine dependence: Secondary | ICD-10-CM | POA: Diagnosis not present

## 2018-04-11 DIAGNOSIS — I130 Hypertensive heart and chronic kidney disease with heart failure and stage 1 through stage 4 chronic kidney disease, or unspecified chronic kidney disease: Secondary | ICD-10-CM | POA: Diagnosis not present

## 2018-04-11 DIAGNOSIS — N189 Chronic kidney disease, unspecified: Secondary | ICD-10-CM | POA: Insufficient documentation

## 2018-04-11 DIAGNOSIS — I5033 Acute on chronic diastolic (congestive) heart failure: Secondary | ICD-10-CM | POA: Diagnosis not present

## 2018-04-11 DIAGNOSIS — R531 Weakness: Secondary | ICD-10-CM | POA: Diagnosis present

## 2018-04-11 DIAGNOSIS — Z79899 Other long term (current) drug therapy: Secondary | ICD-10-CM | POA: Insufficient documentation

## 2018-04-11 DIAGNOSIS — N179 Acute kidney failure, unspecified: Secondary | ICD-10-CM | POA: Insufficient documentation

## 2018-04-11 DIAGNOSIS — R05 Cough: Secondary | ICD-10-CM | POA: Diagnosis not present

## 2018-04-11 DIAGNOSIS — I959 Hypotension, unspecified: Secondary | ICD-10-CM | POA: Insufficient documentation

## 2018-04-11 LAB — COMPREHENSIVE METABOLIC PANEL
ALK PHOS: 70 U/L (ref 38–126)
ALT: 10 U/L (ref 0–44)
ANION GAP: 8 (ref 5–15)
AST: 10 U/L — AB (ref 15–41)
Albumin: 3.6 g/dL (ref 3.5–5.0)
BUN: 27 mg/dL — ABNORMAL HIGH (ref 8–23)
CALCIUM: 8.2 mg/dL — AB (ref 8.9–10.3)
CO2: 28 mmol/L (ref 22–32)
Chloride: 104 mmol/L (ref 98–111)
Creatinine, Ser: 4.44 mg/dL — ABNORMAL HIGH (ref 0.44–1.00)
GFR calc Af Amer: 11 mL/min — ABNORMAL LOW (ref >60)
GFR calc non Af Amer: 9 mL/min — ABNORMAL LOW (ref >60)
Glucose, Bld: 119 mg/dL — ABNORMAL HIGH (ref 70–99)
Potassium: 4 mmol/L (ref 3.5–5.1)
SODIUM: 140 mmol/L (ref 135–145)
Total Bilirubin: 0.6 mg/dL (ref 0.3–1.2)
Total Protein: 6.7 g/dL (ref 6.5–8.1)

## 2018-04-11 LAB — CBC WITH DIFFERENTIAL/PLATELET
BASOS ABS: 0 10*3/uL (ref 0.0–0.1)
BASOS PCT: 0 %
EOS ABS: 0.1 10*3/uL (ref 0.0–0.7)
Eosinophils Relative: 2 %
HCT: 40.4 % (ref 36.0–46.0)
HEMOGLOBIN: 13 g/dL (ref 12.0–15.0)
Lymphocytes Relative: 35 %
Lymphs Abs: 2.4 10*3/uL (ref 0.7–4.0)
MCH: 27.2 pg (ref 26.0–34.0)
MCHC: 32.2 g/dL (ref 30.0–36.0)
MCV: 84.5 fL (ref 78.0–100.0)
Monocytes Absolute: 0.7 10*3/uL (ref 0.1–1.0)
Monocytes Relative: 10 %
NEUTROS ABS: 3.7 10*3/uL (ref 1.7–7.7)
NEUTROS PCT: 53 %
Platelets: 263 10*3/uL (ref 150–400)
RBC: 4.78 MIL/uL (ref 3.87–5.11)
RDW: 16.2 % — ABNORMAL HIGH (ref 11.5–15.5)
WBC: 7 10*3/uL (ref 4.0–10.5)

## 2018-04-11 LAB — TROPONIN I: Troponin I: 0.12 ng/mL (ref <0.03)

## 2018-04-11 LAB — SAMPLE TO BLOOD BANK

## 2018-04-11 LAB — I-STAT CG4 LACTIC ACID, ED
Lactic Acid, Venous: 1.44 mmol/L (ref 0.5–1.9)
Lactic Acid, Venous: 1.04 mmol/L (ref 0.5–1.9)

## 2018-04-11 MED ORDER — SODIUM CHLORIDE 0.9 % IV BOLUS
500.0000 mL | Freq: Once | INTRAVENOUS | Status: AC
  Administered 2018-04-11: 500 mL via INTRAVENOUS

## 2018-04-11 NOTE — ED Notes (Signed)
Patient states she is feeling better.

## 2018-04-11 NOTE — ED Notes (Signed)
Patient back from x-ray. Patient given diet Ginger-ale and crackers. 500 fluid bolus complete.

## 2018-04-11 NOTE — ED Notes (Signed)
Date and time results received: 04/11/18  19:40  Test: Troponin Critical Value: 0.12  Name of Provider Notified: Alvino Chapel  Orders Received? Or Actions Taken?: Provider Alvino Chapel notified

## 2018-04-11 NOTE — Progress Notes (Signed)
I came to evaluate the patient in the ED for admission after call from Dr. Alvino Chapel.  The patient stated that she wanted to go home and was upset about how long she had been waiting through the day.  I warned her about her risks of leaving and she fully understands.  I explained to her that she has congestive heart failure with severe AKI and should be seen by nephrology, but she adamantly states she wanted to leave.  She is alert and oriented and competent to make decisions at this time.  I made her nurse aware and she stated that she would speak to Dr. Alvino Chapel about the patient's decision to leave AMA.

## 2018-04-11 NOTE — ED Provider Notes (Signed)
[facility] EMERGENCY DEPARTMENT Provider Note   CSN: [id number] Arrival date & time: 04/11/18  1801     History   Chief Complaint Chief Complaint  Patient presents with  . Dizziness    HPI Erica [name] is a 67 y.o. female.  HPI Patient presents with weakness.  Has had a cough and some shortness of breath.  States she is felt lightheaded and had more difficulty walking.  No chest pain.  No dysuria.  Has had a somewhat decreased oral intake.  No fevers.  States she has had to have blood transfusions in the past when she felt like this.  However she does not have any black stools or blood in the stool this time.  Upon arrival patient is hypotensive. Past Medical History:  Diagnosis Date  . Arthritis   . CHF (congestive heart failure) ([place])   . Chronic back pain   . COPD (chronic obstructive pulmonary disease) (HCC)    2L home O2  . Diabetes mellitus without complication ([place])   . Hyperlipidemia   . Hypertension   . Stroke (cerebrum) [facility])     Patient Active Problem List   Diagnosis Date Noted  . Acute diastolic (congestive) heart failure ([place]) 03/09/2018  . AVM (arteriovenous malformation) of duodenum, acquired   . GIB (gastrointestinal bleeding) 01/23/2018  . CKD (chronic kidney disease) 01/23/2018  . Hyperkalemia 12/21/2017  . Hyperlipidemia 12/18/2017  . Hypomagnesemia 12/17/2017  . Acute renal failure superimposed on stage 3 chronic kidney disease ([place]) 12/07/2017  . Acute on chronic respiratory failure with hypoxia ([place]) 12/07/2017  . COPD with acute exacerbation ([place]) 12/07/2017  . Acute diastolic CHF (congestive heart failure) ([place]) 09/19/2017  . Iron deficiency anemia due to chronic blood loss   . Acute diverticulitis 08/29/2017  . Heme positive stool   . Symptomatic anemia   . GI bleed 08/27/2017  . Elevated troponin 08/27/2017  . (HFpEF) heart failure with preserved ejection fraction ([place]) 08/27/2017  . Acute dyspnea   . Acute on chronic respiratory  failure ([place]) 02/11/2017  . Acute on chronic diastolic CHF (congestive heart failure) ([place]) 02/11/2017  . Benign essential HTN 11/20/2015  . GERD (gastroesophageal reflux disease) 11/20/2015  . Tobacco abuse 11/20/2015  . Type 2 diabetes mellitus with hyperglycemia ([place]) 11/20/2015  . COPD exacerbation ([place]) 09/05/2015  . Chronic pain syndrome 09/05/2015  . Hypertension 09/05/2015  . Diabetes mellitus type 2, controlled ([place]) 09/05/2015  . Chronic respiratory failure ([place]) 09/05/2015  . OLECRANON BURSITIS, LEFT 08/04/2010    Past Surgical History:  Procedure Laterality Date  . ABDOMINAL HYSTERECTOMY    . colonoscopy  2009   Dr. Gala Romney: cluster of diminutive rectal polyps and 2 diminutive polyps in rectosigmoid junction, hyperplastic  . COLONOSCOPY WITH PROPOFOL N/A 10/18/2017   Procedure: COLONOSCOPY WITH PROPOFOL;  Surgeon: Daneil Dolin, MD;  Location: [facility] ENDO SUITE;  Service: Endoscopy;  Laterality: N/A;  9:30am  . ESOPHAGOGASTRODUODENOSCOPY N/A 01/25/2018   Procedure: ESOPHAGOGASTRODUODENOSCOPY (EGD);  Surgeon: Danie Binder, MD;  Location: [facility] ENDO SUITE;  Service: Endoscopy;  Laterality: N/A;  . ESOPHAGOGASTRODUODENOSCOPY (EGD) WITH PROPOFOL N/A 08/29/2017   Dr. Gala Romney with propofol: small hiatal hernia. 4cm fundal diverticulum.   Marland Kitchen GIVENS CAPSULE STUDY N/A 01/25/2018   Procedure: GIVENS CAPSULE STUDY;  Surgeon: Danie Binder, MD;  Location: [facility] ENDO SUITE;  Service: Endoscopy;  Laterality: N/A;  . left hand middle finger     in an accident at work, amputated     OB History  Gravida  0   Para  0   Term  0   Preterm  0   AB  0   Living        SAB  0   TAB  0   Ectopic  0   Multiple      Live Births               Home Medications    Prior to Admission medications   Medication Sig Start Date End Date Taking? Authorizing Provider  amLODipine (NORVASC) 10 MG tablet Take 1 tablet by mouth daily. 12/27/17  Yes [provider]  atorvastatin  (LIPITOR) 80 MG tablet Take 80 mg by mouth daily.  12/04/16  Yes [provider]  budesonide-formoterol (SYMBICORT) 160-4.5 MCG/ACT inhaler Inhale 2 puffs into the lungs 2 (two) times daily. 11/23/15  Yes Barton Dubois, MD  buPROPion [facility] SR) 150 MG 12 hr tablet Take 150 mg by mouth 2 (two) times daily. 12/04/16  Yes [provider]  cetirizine (ZYRTEC) 10 MG tablet Take 1 tablet by mouth daily. 01/28/18  Yes [provider]  clopidogrel (PLAVIX) 75 MG tablet Take 75 mg by mouth daily.  03/29/18  Yes [provider]  famotidine (PEPCID) 20 MG tablet Take 20 mg by mouth 2 (two) times daily.  03/29/18  Yes [provider]  ferrous sulfate 325 (65 FE) MG EC tablet Take 1 tablet (325 mg total) by mouth 2 (two) times daily. 01/25/18 04/11/18 Yes Erline Hau, MD  furosemide (LASIX) 40 MG tablet Take 1 tablet (40 mg total) by mouth 2 (two) times daily. 03/10/18  Yes Johnson, Clanford L, MD  GNP ASPIRIN LOW DOSE 81 MG EC tablet Take 2 tablets by mouth daily. 02/22/18  Yes [provider]  ipratropium-albuterol (DUONEB) 0.5-2.5 (3) MG/3ML SOLN Take 3 mLs by nebulization every 6 (six) hours as needed (for shortness of breath).   Yes [provider]  Linaclotide (LINZESS) 145 MCG CAPS capsule Take 145 mcg by mouth daily as needed (for constipation).    Yes [provider]  metoprolol tartrate (LOPRESSOR) 25 MG tablet Take 0.5 tablets (12.5 mg total) by mouth 2 (two) times daily. 08/30/17  Yes Tat, Shanon Brow, MD  omega-3 acid ethyl esters (LOVAZA) 1 g capsule Take 2 g by mouth 2 (two) times daily.  12/07/16  Yes [provider]  OXYGEN Inhale 2 L into the lungs continuous.   Yes [provider]  pantoprazole (PROTONIX) 40 MG tablet Take 1 tablet (40 mg total) by mouth daily. 01/25/18  Yes Isaac Bliss, Rayford Halsted, MD  potassium chloride SA (K-DUR,KLOR-CON) 20 MEQ tablet Take 2 tablets (40 mEq total) by mouth  daily. Patient taking differently: Take 20 mEq by mouth daily.  03/10/18 04/11/18 Yes Johnson, Clanford L, MD  saxagliptin HCl (ONGLYZA) 5 MG TABS tablet Take 5 mg by mouth daily. Reported on 09/22/2015   Yes [provider]  SPIRIVA HANDIHALER 18 MCG inhalation capsule Take 1 puff by mouth daily. 10/31/17  Yes [provider]  valsartan-hydrochlorothiazide (DIOVAN-HCT) 160-25 MG tablet Take 1 tablet by mouth daily. 12/27/17  Yes [provider]  predniSONE (DELTASONE) 20 MG tablet Take 3 PO QAM x3days, 2 PO QAM x3days, 1 PO QAM x3days Patient not taking: Reported on 04/11/2018 03/10/18   Murlean Iba, MD    Family History Family History  Problem Relation Age of Onset  . CVA Mother 49  . Cancer Neg Hx   .  Colon cancer Neg Hx     Social History Social History   Tobacco Use  . Smoking status: Former Smoker    Packs/day: 1.00    Years: 52.00    Pack years: 52.00    Types: Cigarettes    Last attempt to quit: 09/23/2017    Years since quitting: 0.5  . Smokeless tobacco: Never Used  Substance Use Topics  . Alcohol use: No  . Drug use: No     Allergies   Patient has no known allergies.   Review of Systems Review of Systems  Constitutional: Negative for appetite change.  HENT: Negative for congestion.   Respiratory: Positive for cough. Negative for shortness of breath.   Cardiovascular: Negative for chest pain.  Gastrointestinal: Negative for abdominal pain.  Genitourinary: Negative for flank pain.  Musculoskeletal: Negative for back pain.  Skin: Negative for rash.  Neurological: Positive for light-headedness.  Hematological: Negative for adenopathy.  Psychiatric/Behavioral: Negative for confusion.     Physical Exam Updated Vital Signs BP (!) 97/42   Pulse 81   Temp 98.4 F (36.9 C) (Oral)   Resp (!) 23   Ht 5\' 4"  (1.626 m)   Wt 77.1 kg (170 lb)   SpO2 (!) 80%   BMI 29.18 kg/m    Physical Exam  Constitutional: She appears  well-developed.  HENT:  Head: Normocephalic.  Eyes: Pupils are equal, round, and reactive to light.  Cardiovascular: Normal rate.  Pulmonary/Chest:  Diffuse harsh breath sounds.  Abdominal: Soft.  Musculoskeletal: She exhibits edema.  Some edema bilateral lower extremities.  Neurological: She is alert.  Skin: Skin is warm. Capillary refill takes less than 2 seconds.     ED Treatments / Results  Labs (all labs ordered are listed, but only abnormal results are displayed) Labs Reviewed  CBC WITH DIFFERENTIAL/PLATELET - Abnormal; Notable for the following components:      Result Value   RDW 16.2 (*)    All other components within normal limits  COMPREHENSIVE METABOLIC PANEL - Abnormal; Notable for the following components:   Glucose, Bld 119 (*)    BUN 27 (*)    Creatinine, Ser 4.44 (*)    Calcium 8.2 (*)    AST 10 (*)    GFR calc non Af Amer 9 (*)    GFR calc Af Amer 11 (*)    All other components within normal limits  TROPONIN I - Abnormal; Notable for the following components:   Troponin I 0.12 (*)    All other components within normal limits  URINALYSIS, ROUTINE W REFLEX MICROSCOPIC  I-STAT CG4 LACTIC ACID, ED  I-STAT CG4 LACTIC ACID, ED  SAMPLE TO BLOOD BANK    EKG EKG Interpretation  Date/Time:  Thursday April 11 2018 18:44:00 EDT Ventricular Rate:  73 PR Interval:    QRS Duration: 103 QT Interval:  398 QTC Calculation: 439 R Axis:   56 Text Interpretation:  Sinus rhythm LAE, consider biatrial enlargement RSR' in V1 or V2, probably normal variant Minimal ST depression, inferior leads Confirmed by Davonna Belling [phone]) on 04/11/2018 7:44:43 PM   Radiology Dg Chest 2 View  Result Date: 04/11/2018 CLINICAL DATA:  Cough and shortness of breath. EXAM: CHEST - 2 VIEW COMPARISON:  March 10, 2018 FINDINGS: The heart size is enlarged. Mediastinal contour is normal. There is pulmonary edema. Minimal right pleural effusion is noted. Calcified pleural plaque is noted.  Degenerative joint changes of the left shoulder are noted. IMPRESSION: Congestive heart failure. Electronically Signed  By: Abelardo Diesel M.D.   On: 04/11/2018 19:50    Procedures Procedures (including critical care time)  Medications Ordered in ED Medications  sodium chloride 0.9 % bolus 500 mL (0 mLs Intravenous Stopped 04/11/18 1917)     Initial Impression / Assessment and Plan / ED Course  I have reviewed the triage vital signs and the nursing notes.  Pertinent labs & imaging results that were available during my care of the patient were reviewed by me and considered in my medical decision making (see chart for details).     Patient with shortness of breath and weakness. x-ray shows CHF.  Troponin mildly elevated.  Has hypotension 2.  No infection.  Troponin likely elevated because of demand ischemia.  Will admit to hospitalist.  Pulse ox had been adjusted pulse ox was not 80%.  CRITICAL CARE Performed by: Davonna Belling Total critical care time: 30 minutes Critical care time was exclusive of separately billable procedures and treating other patients. Critical care was necessary to treat or prevent imminent or life-threatening deterioration. Critical care was time spent personally by me on the following activities: development of treatment plan with patient and/or surrogate as well as nursing, discussions with consultants, evaluation of patient's response to treatment, examination of patient, obtaining history from patient or surrogate, ordering and performing treatments and interventions, ordering and review of laboratory studies, ordering and review of radiographic studies, pulse oximetry and re-evaluation of patient's condition.  Final Clinical Impressions(s) / ED Diagnoses   Final diagnoses:  AKI (acute kidney injury) ([place])  Acute on chronic congestive heart failure, unspecified heart failure type ([place])  Hypotension, unspecified hypotension type    ED Discharge Orders     None      Davonna Belling, MD 04/11/18 2201

## 2018-04-11 NOTE — ED Notes (Signed)
Patient advised she was leaving and that she did not want to stay here anymore.

## 2018-04-11 NOTE — ED Notes (Signed)
Pt was informed that we need a urine sample. Pt states that she can not urinate at this time. 

## 2018-04-11 NOTE — ED Notes (Signed)
Patient place on 4 liters of oxygen due to O2 sats at 90 percent currently.

## 2018-04-11 NOTE — ED Notes (Signed)
ED Provider at bedside. 

## 2018-04-11 NOTE — ED Triage Notes (Signed)
Having dizzy spells on and off since yesterday.  Pt reports hx of this in the past, has had to have blood transfusion

## 2018-04-12 DIAGNOSIS — E1122 Type 2 diabetes mellitus with diabetic chronic kidney disease: Secondary | ICD-10-CM | POA: Diagnosis not present

## 2018-04-12 DIAGNOSIS — I5032 Chronic diastolic (congestive) heart failure: Secondary | ICD-10-CM | POA: Diagnosis not present

## 2018-04-12 DIAGNOSIS — J449 Chronic obstructive pulmonary disease, unspecified: Secondary | ICD-10-CM | POA: Diagnosis not present

## 2018-04-12 DIAGNOSIS — J9621 Acute and chronic respiratory failure with hypoxia: Secondary | ICD-10-CM | POA: Diagnosis not present

## 2018-04-12 DIAGNOSIS — J441 Chronic obstructive pulmonary disease with (acute) exacerbation: Secondary | ICD-10-CM | POA: Diagnosis not present

## 2018-04-12 DIAGNOSIS — R42 Dizziness and giddiness: Secondary | ICD-10-CM | POA: Diagnosis not present

## 2018-04-12 DIAGNOSIS — N183 Chronic kidney disease, stage 3 (moderate): Secondary | ICD-10-CM | POA: Diagnosis not present

## 2018-04-12 DIAGNOSIS — I130 Hypertensive heart and chronic kidney disease with heart failure and stage 1 through stage 4 chronic kidney disease, or unspecified chronic kidney disease: Secondary | ICD-10-CM | POA: Diagnosis not present

## 2018-04-12 DIAGNOSIS — N179 Acute kidney failure, unspecified: Secondary | ICD-10-CM | POA: Diagnosis not present

## 2018-04-14 ENCOUNTER — Encounter: Payer: Self-pay | Admitting: Cardiology

## 2018-04-16 ENCOUNTER — Encounter (HOSPITAL_COMMUNITY): Payer: Medicare HMO

## 2018-04-16 ENCOUNTER — Encounter (HOSPITAL_COMMUNITY): Payer: Self-pay

## 2018-04-23 ENCOUNTER — Other Ambulatory Visit: Payer: Self-pay

## 2018-04-24 ENCOUNTER — Ambulatory Visit: Payer: Medicare HMO | Admitting: Gastroenterology

## 2018-04-27 DIAGNOSIS — J449 Chronic obstructive pulmonary disease, unspecified: Secondary | ICD-10-CM | POA: Diagnosis not present

## 2018-05-28 DIAGNOSIS — J449 Chronic obstructive pulmonary disease, unspecified: Secondary | ICD-10-CM | POA: Diagnosis not present

## 2018-06-11 ENCOUNTER — Encounter: Payer: Self-pay | Admitting: Gastroenterology

## 2018-06-11 ENCOUNTER — Ambulatory Visit: Payer: Medicare HMO | Admitting: Gastroenterology

## 2018-06-11 ENCOUNTER — Telehealth: Payer: Self-pay | Admitting: Gastroenterology

## 2018-06-11 NOTE — Telephone Encounter (Signed)
PATIENT WAS A NO SHOW AND LETTER SENT  °

## 2018-06-14 ENCOUNTER — Ambulatory Visit: Payer: Medicare HMO | Admitting: Gastroenterology

## 2018-06-27 DIAGNOSIS — J449 Chronic obstructive pulmonary disease, unspecified: Secondary | ICD-10-CM | POA: Diagnosis not present

## 2018-07-01 DIAGNOSIS — J449 Chronic obstructive pulmonary disease, unspecified: Secondary | ICD-10-CM | POA: Diagnosis not present

## 2018-07-01 DIAGNOSIS — N183 Chronic kidney disease, stage 3 (moderate): Secondary | ICD-10-CM | POA: Diagnosis not present

## 2018-07-01 DIAGNOSIS — E1122 Type 2 diabetes mellitus with diabetic chronic kidney disease: Secondary | ICD-10-CM | POA: Diagnosis not present

## 2018-07-01 DIAGNOSIS — I10 Essential (primary) hypertension: Secondary | ICD-10-CM | POA: Diagnosis not present

## 2018-07-01 DIAGNOSIS — Z9981 Dependence on supplemental oxygen: Secondary | ICD-10-CM | POA: Diagnosis not present

## 2018-07-28 DIAGNOSIS — J449 Chronic obstructive pulmonary disease, unspecified: Secondary | ICD-10-CM | POA: Diagnosis not present

## 2018-08-07 ENCOUNTER — Ambulatory Visit: Payer: Self-pay | Admitting: Cardiology

## 2018-08-07 NOTE — Progress Notes (Deleted)
Clinical Summary Erica [name] is a 67 y.o.female  1. SOB - admit 11/2017 with fever, leukocytosis, severe hypoxia to 70s - mild trop elevation at that time, peak 0.53. Thought to be demand ischemia. Based on risk factors plans for outpatient stress pending symptoms.  - 11/2017 echo LVEF 60-65%, no WMAs  - no recent chest pain. Chronic SOB/DOE has increased   2. Chronic diastolic HF 0/5397 echo LVEF 60-65%, no WMAs. - admit 02/2018 with fluid overload - diuresed with improved symptoms - occasional swelling  - seen in ER 03/2018 with CXR showing CHF. Hypoxic, required 4 L up from her 2L normal. Refused admission    3. COPD - on home O2 2L  4. Hypertension - compliant with meds  5. Hyperlipidemia - compliant with meds   6. DM2 - followed by pcp   7. CKD 3 - last Cr 1.52 in 02/2018  - seen in ER 03/2018 with AKI, left AMA. Cr was up to 4.44   8. PSVT  9 GI bleed - admit 01/2018 with GI bleed. Hgb 7.3 down from 9. Had heme +stools.  - EGD normal. Bleed thought due to AVMs. Transfused 2 units pRBC Past Medical History:  Diagnosis Date  . Arthritis   . CHF (congestive heart failure) ([place])   . Chronic back pain   . COPD (chronic obstructive pulmonary disease) ([facility])    2L home O2  . Diabetes mellitus without complication ([place])   . Hyperlipidemia   . Hypertension   . Stroke (cerebrum) ([facility])      No Known Allergies   Current Outpatient Medications  Medication Sig Dispense Refill  . amLODipine (NORVASC) 10 MG tablet Take 1 tablet by mouth daily.    Marland Kitchen atorvastatin (LIPITOR) 80 MG tablet Take 80 mg by mouth daily.     . budesonide-formoterol (SYMBICORT) 160-4.5 MCG/ACT inhaler Inhale 2 puffs into the lungs 2 (two) times daily. 1 Inhaler 3  . buPROPion (WELLBUTRIN SR) 150 MG 12 hr tablet Take 150 mg by mouth 2 (two) times daily.    . cetirizine (ZYRTEC) 10 MG tablet Take 1 tablet by mouth daily.    . clopidogrel (PLAVIX) 75 MG tablet Take 75 mg by  mouth daily.   5  . famotidine (PEPCID) 20 MG tablet Take 20 mg by mouth 2 (two) times daily.   10  . ferrous sulfate 325 (65 FE) MG EC tablet Take 1 tablet (325 mg total) by mouth 2 (two) times daily. 60 tablet 2  . furosemide (LASIX) 40 MG tablet Take 1 tablet (40 mg total) by mouth 2 (two) times daily. 60 tablet 0  . GNP ASPIRIN LOW DOSE 81 MG EC tablet Take 2 tablets by mouth daily.  5  . ipratropium-albuterol (DUONEB) 0.5-2.5 (3) MG/3ML SOLN Take 3 mLs by nebulization every 6 (six) hours as needed (for shortness of breath).    . Linaclotide (LINZESS) 145 MCG CAPS capsule Take 145 mcg by mouth daily as needed (for constipation).     . metoprolol tartrate (LOPRESSOR) 25 MG tablet Take 0.5 tablets (12.5 mg total) by mouth 2 (two) times daily. 60 tablet 1  . omega-3 acid ethyl esters (LOVAZA) 1 g capsule Take 2 g by mouth 2 (two) times daily.     . OXYGEN Inhale 2 L into the lungs continuous.    . pantoprazole (PROTONIX) 40 MG tablet Take 1 tablet (40 mg total) by mouth daily. 30 tablet 2  . potassium chloride SA (K-DUR,KLOR-CON) 20  MEQ tablet Take 2 tablets (40 mEq total) by mouth daily. (Patient taking differently: Take 20 mEq by mouth daily. ) 60 tablet 0  . predniSONE (DELTASONE) 20 MG tablet Take 3 PO QAM x3days, 2 PO QAM x3days, 1 PO QAM x3days (Patient not taking: Reported on 04/11/2018) 18 tablet 0  . saxagliptin HCl (ONGLYZA) 5 MG TABS tablet Take 5 mg by mouth daily. Reported on 09/22/2015    . SPIRIVA HANDIHALER 18 MCG inhalation capsule Take 1 puff by mouth daily.    . valsartan-hydrochlorothiazide (DIOVAN-HCT) 160-25 MG tablet Take 1 tablet by mouth daily.     No current facility-administered medications for this visit.      Past Surgical History:  Procedure Laterality Date  . ABDOMINAL HYSTERECTOMY    . colonoscopy  2009   Dr. Gala Romney: cluster of diminutive rectal polyps and 2 diminutive polyps in rectosigmoid junction, hyperplastic  . COLONOSCOPY WITH PROPOFOL N/A 10/18/2017    Procedure: COLONOSCOPY WITH PROPOFOL;  Surgeon: Daneil Dolin, MD;  Location: [facility];  Service: Endoscopy;  Laterality: N/A;  9:30am  . ESOPHAGOGASTRODUODENOSCOPY N/A 01/25/2018   Procedure: ESOPHAGOGASTRODUODENOSCOPY (EGD);  Surgeon: Danie Binder, MD;  Location: [facility];  Service: Endoscopy;  Laterality: N/A;  . ESOPHAGOGASTRODUODENOSCOPY (EGD) WITH PROPOFOL N/A 08/29/2017   Dr. Gala Romney with propofol: small hiatal hernia. 4cm fundal diverticulum.   Marland Kitchen GIVENS CAPSULE STUDY N/A 01/25/2018   Procedure: GIVENS CAPSULE STUDY;  Surgeon: Danie Binder, MD;  Location: [facility];  Service: Endoscopy;  Laterality: N/A;  . left hand middle finger     in an accident at work, amputated     No Known Allergies    Family History  Problem Relation Age of Onset  . CVA Mother 64  . Cancer Neg Hx   . Colon cancer Neg Hx      Social History Erica [name] reports that she quit smoking about 10 months ago. Her smoking use included cigarettes. She has a 52.00 pack-year smoking history. She has never used smokeless tobacco. Erica [name] reports that she does not drink alcohol.   Review of Systems CONSTITUTIONAL: No weight loss, fever, chills, weakness or fatigue.  HEENT: Eyes: No visual loss, blurred vision, double vision or yellow sclerae.No hearing loss, sneezing, congestion, runny nose or sore throat.  SKIN: No rash or itching.  CARDIOVASCULAR:  RESPIRATORY: No shortness of breath, cough or sputum.  GASTROINTESTINAL: No anorexia, nausea, vomiting or diarrhea. No abdominal pain or blood.  GENITOURINARY: No burning on urination, no polyuria NEUROLOGICAL: No headache, dizziness, syncope, paralysis, ataxia, numbness or tingling in the extremities. No change in bowel or bladder control.  MUSCULOSKELETAL: No muscle, back pain, joint pain or stiffness.  LYMPHATICS: No enlarged nodes. No history of splenectomy.  PSYCHIATRIC: No history of depression or anxiety.  ENDOCRINOLOGIC: No reports of  sweating, cold or heat intolerance. No polyuria or polydipsia.  Marland Kitchen   Physical Examination There were no vitals filed for this visit. There were no vitals filed for this visit.  Gen: resting comfortably, no acute distress HEENT: no scleral icterus, pupils equal round and reactive, no palptable cervical adenopathy,  CV Resp: Clear to auscultation bilaterally GI: abdomen is soft, non-tender, non-distended, normal bowel sounds, no hepatosplenomegaly MSK: extremities are warm, no edema.  Skin: warm, no rash Neuro:  no focal deficits Psych: appropriate affect   Diagnostic Studies 11/2017 echo Study Conclusions  - Limited echo to evaluate LV function. - Left ventricle: The cavity size was normal. Wall  thickness was normal. Systolic function was normal. The estimated ejection fraction was in the range of 60% to 65%. Wall motion was normal; there were no regional wall motion abnormalities. - Aortic valve: Valve area (VTI): 2.17 cm^2. Valve area (Vmax): 2.14 cm^2. - Mitral valve: There was mild regurgitation.    Assessment and Plan   1. SOB - recent symptoms as reported above. unlcear if ischemic component, prior admissoins with trop elevations in setting of systmic illness - plan for [place] nuclear stress to further evaluate  2. HTN - at goal, conitnue current meds  3. Hyperlipidemia - continue high dose statin in setting of DM2     Arnoldo Lenis, M.D., F.A.C.C.

## 2018-08-08 ENCOUNTER — Encounter: Payer: Self-pay | Admitting: Cardiology

## 2018-08-27 DIAGNOSIS — J449 Chronic obstructive pulmonary disease, unspecified: Secondary | ICD-10-CM | POA: Diagnosis not present

## 2018-09-03 ENCOUNTER — Other Ambulatory Visit: Payer: Self-pay

## 2018-09-03 ENCOUNTER — Emergency Department (HOSPITAL_COMMUNITY): Payer: Medicare HMO

## 2018-09-03 ENCOUNTER — Inpatient Hospital Stay (HOSPITAL_COMMUNITY)
Admission: EM | Admit: 2018-09-03 | Discharge: 2018-09-06 | DRG: 190 | Disposition: A | Discharge status: Home / Self Care | Payer: Medicare HMO | Attending: Family Medicine | Admitting: Family Medicine

## 2018-09-03 ENCOUNTER — Encounter (HOSPITAL_COMMUNITY): Payer: Self-pay | Admitting: Emergency Medicine

## 2018-09-03 DIAGNOSIS — G8929 Other chronic pain: Secondary | ICD-10-CM | POA: Diagnosis present

## 2018-09-03 DIAGNOSIS — E875 Hyperkalemia: Secondary | ICD-10-CM | POA: Diagnosis present

## 2018-09-03 DIAGNOSIS — I509 Heart failure, unspecified: Secondary | ICD-10-CM

## 2018-09-03 DIAGNOSIS — J849 Interstitial pulmonary disease, unspecified: Secondary | ICD-10-CM | POA: Diagnosis present

## 2018-09-03 DIAGNOSIS — J9621 Acute and chronic respiratory failure with hypoxia: Secondary | ICD-10-CM | POA: Diagnosis not present

## 2018-09-03 DIAGNOSIS — Z8673 Personal history of transient ischemic attack (TIA), and cerebral infarction without residual deficits: Secondary | ICD-10-CM

## 2018-09-03 DIAGNOSIS — E785 Hyperlipidemia, unspecified: Secondary | ICD-10-CM | POA: Diagnosis present

## 2018-09-03 DIAGNOSIS — Z823 Family history of stroke: Secondary | ICD-10-CM | POA: Diagnosis not present

## 2018-09-03 DIAGNOSIS — Z7902 Long term (current) use of antithrombotics/antiplatelets: Secondary | ICD-10-CM

## 2018-09-03 DIAGNOSIS — I5033 Acute on chronic diastolic (congestive) heart failure: Secondary | ICD-10-CM | POA: Diagnosis present

## 2018-09-03 DIAGNOSIS — N183 Chronic kidney disease, stage 3 (moderate): Secondary | ICD-10-CM | POA: Diagnosis not present

## 2018-09-03 DIAGNOSIS — Z7951 Long term (current) use of inhaled steroids: Secondary | ICD-10-CM

## 2018-09-03 DIAGNOSIS — Z23 Encounter for immunization: Secondary | ICD-10-CM | POA: Diagnosis not present

## 2018-09-03 DIAGNOSIS — I5031 Acute diastolic (congestive) heart failure: Secondary | ICD-10-CM | POA: Diagnosis present

## 2018-09-03 DIAGNOSIS — R079 Chest pain, unspecified: Secondary | ICD-10-CM | POA: Diagnosis not present

## 2018-09-03 DIAGNOSIS — J441 Chronic obstructive pulmonary disease with (acute) exacerbation: Principal | ICD-10-CM | POA: Diagnosis present

## 2018-09-03 DIAGNOSIS — I120 Hypertensive chronic kidney disease with stage 5 chronic kidney disease or end stage renal disease: Secondary | ICD-10-CM | POA: Diagnosis not present

## 2018-09-03 DIAGNOSIS — I130 Hypertensive heart and chronic kidney disease with heart failure and stage 1 through stage 4 chronic kidney disease, or unspecified chronic kidney disease: Secondary | ICD-10-CM | POA: Diagnosis present

## 2018-09-03 DIAGNOSIS — J449 Chronic obstructive pulmonary disease, unspecified: Secondary | ICD-10-CM

## 2018-09-03 DIAGNOSIS — Z90710 Acquired absence of both cervix and uterus: Secondary | ICD-10-CM | POA: Diagnosis not present

## 2018-09-03 DIAGNOSIS — E1122 Type 2 diabetes mellitus with diabetic chronic kidney disease: Secondary | ICD-10-CM | POA: Diagnosis present

## 2018-09-03 DIAGNOSIS — J9601 Acute respiratory failure with hypoxia: Secondary | ICD-10-CM | POA: Diagnosis not present

## 2018-09-03 DIAGNOSIS — K219 Gastro-esophageal reflux disease without esophagitis: Secondary | ICD-10-CM | POA: Diagnosis present

## 2018-09-03 DIAGNOSIS — N179 Acute kidney failure, unspecified: Secondary | ICD-10-CM | POA: Diagnosis present

## 2018-09-03 DIAGNOSIS — Z9981 Dependence on supplemental oxygen: Secondary | ICD-10-CM | POA: Diagnosis not present

## 2018-09-03 DIAGNOSIS — I10 Essential (primary) hypertension: Secondary | ICD-10-CM | POA: Diagnosis present

## 2018-09-03 DIAGNOSIS — E1165 Type 2 diabetes mellitus with hyperglycemia: Secondary | ICD-10-CM | POA: Diagnosis not present

## 2018-09-03 DIAGNOSIS — Z79899 Other long term (current) drug therapy: Secondary | ICD-10-CM | POA: Diagnosis not present

## 2018-09-03 DIAGNOSIS — R0602 Shortness of breath: Secondary | ICD-10-CM | POA: Diagnosis not present

## 2018-09-03 DIAGNOSIS — N189 Chronic kidney disease, unspecified: Secondary | ICD-10-CM | POA: Diagnosis present

## 2018-09-03 DIAGNOSIS — Z7952 Long term (current) use of systemic steroids: Secondary | ICD-10-CM | POA: Diagnosis not present

## 2018-09-03 LAB — COMPREHENSIVE METABOLIC PANEL
ALT: 7 U/L (ref 0–44)
ANION GAP: 6 (ref 5–15)
AST: 8 U/L — ABNORMAL LOW (ref 15–41)
Albumin: 3.7 g/dL (ref 3.5–5.0)
Alkaline Phosphatase: 67 U/L (ref 38–126)
BILIRUBIN TOTAL: 0.6 mg/dL (ref 0.3–1.2)
BUN: 27 mg/dL — ABNORMAL HIGH (ref 8–23)
CO2: 29 mmol/L (ref 22–32)
Calcium: 8.7 mg/dL — ABNORMAL LOW (ref 8.9–10.3)
Chloride: 107 mmol/L (ref 98–111)
Creatinine, Ser: 1.72 mg/dL — ABNORMAL HIGH (ref 0.44–1.00)
GFR calc Af Amer: 35 mL/min — ABNORMAL LOW (ref >60)
GFR, EST NON AFRICAN AMERICAN: 30 mL/min — AB (ref >60)
Glucose, Bld: 127 mg/dL — ABNORMAL HIGH (ref 70–99)
POTASSIUM: 4.2 mmol/L (ref 3.5–5.1)
Sodium: 142 mmol/L (ref 135–145)
TOTAL PROTEIN: 6.8 g/dL (ref 6.5–8.1)

## 2018-09-03 LAB — CBC WITH DIFFERENTIAL/PLATELET
ABS IMMATURE GRANULOCYTES: 0.01 10*3/uL (ref 0.00–0.07)
BASOS PCT: 1 %
Basophils Absolute: 0 10*3/uL (ref 0.0–0.1)
Eosinophils Absolute: 0.1 10*3/uL (ref 0.0–0.5)
Eosinophils Relative: 3 %
HCT: 44.4 % (ref 36.0–46.0)
Hemoglobin: 13.5 g/dL (ref 12.0–15.0)
Immature Granulocytes: 0 %
Lymphocytes Relative: 29 %
Lymphs Abs: 1.2 10*3/uL (ref 0.7–4.0)
MCH: 27.1 pg (ref 26.0–34.0)
MCHC: 30.4 g/dL (ref 30.0–36.0)
MCV: 89.2 fL (ref 80.0–100.0)
MONO ABS: 0.3 10*3/uL (ref 0.1–1.0)
Monocytes Relative: 8 %
NEUTROS ABS: 2.4 10*3/uL (ref 1.7–7.7)
Neutrophils Relative %: 59 %
Platelets: 220 10*3/uL (ref 150–400)
RBC: 4.98 MIL/uL (ref 3.87–5.11)
RDW: 16.2 % — ABNORMAL HIGH (ref 11.5–15.5)
WBC: 4 10*3/uL (ref 4.0–10.5)
nRBC: 0 % (ref 0.0–0.2)

## 2018-09-03 LAB — TROPONIN I
Troponin I: <0.03 ng/mL (ref <0.03)
Troponin I: <0.03 ng/mL (ref <0.03)
Troponin I: <0.03 ng/mL (ref <0.03)

## 2018-09-03 LAB — GLUCOSE, CAPILLARY
Glucose-Capillary: 83 mg/dL (ref 70–99)
Glucose-Capillary: 196 mg/dL — ABNORMAL HIGH (ref 70–99)

## 2018-09-03 LAB — BRAIN NATRIURETIC PEPTIDE: B Natriuretic Peptide: 200 pg/mL — ABNORMAL HIGH (ref 0.0–100.0)

## 2018-09-03 MED ORDER — SODIUM CHLORIDE 0.9% FLUSH: 3.0000 mL | INTRAVENOUS | Status: DC | PRN

## 2018-09-03 MED ORDER — FERROUS SULFATE 325 (65 FE) MG PO TABS
325.0000 mg | ORAL_TABLET | Freq: Two times a day (BID) | ORAL | Status: DC
  Administered 2018-09-04 – 2018-09-06 (×5): 325 mg via ORAL
  Filled 2018-09-03 (×5): qty 1

## 2018-09-03 MED ORDER — SODIUM CHLORIDE 0.9% FLUSH
3.0000 mL | Freq: Two times a day (BID) | INTRAVENOUS | Status: DC
  Administered 2018-09-03 – 2018-09-05 (×4): 3 mL via INTRAVENOUS

## 2018-09-03 MED ORDER — SODIUM CHLORIDE 0.9 % IV SOLN: 250.0000 mL | INTRAVENOUS | Status: DC | PRN

## 2018-09-03 MED ORDER — HEPARIN SODIUM (PORCINE) 5000 UNIT/ML IJ SOLN
5000.0000 [IU] | Freq: Three times a day (TID) | INTRAMUSCULAR | Status: DC
  Administered 2018-09-03 – 2018-09-05 (×7): 5000 [IU] via SUBCUTANEOUS
  Filled 2018-09-03 (×7): qty 1

## 2018-09-03 MED ORDER — IPRATROPIUM-ALBUTEROL 0.5-2.5 (3) MG/3ML IN SOLN
3.0000 mL | Freq: Four times a day (QID) | RESPIRATORY_TRACT | Status: DC
  Administered 2018-09-03 – 2018-09-06 (×11): 3 mL via RESPIRATORY_TRACT
  Filled 2018-09-03 (×12): qty 3

## 2018-09-03 MED ORDER — BUPROPION HCL ER (SR) 150 MG PO TB12
150.0000 mg | ORAL_TABLET | Freq: Two times a day (BID) | ORAL | Status: DC
  Administered 2018-09-03 – 2018-09-06 (×6): 150 mg via ORAL
  Filled 2018-09-03 (×6): qty 1

## 2018-09-03 MED ORDER — LORATADINE 10 MG PO TABS
10.0000 mg | ORAL_TABLET | Freq: Every day | ORAL | Status: DC
  Administered 2018-09-04 – 2018-09-06 (×3): 10 mg via ORAL
  Filled 2018-09-03 (×3): qty 1

## 2018-09-03 MED ORDER — INSULIN ASPART 100 UNIT/ML ~~LOC~~ SOLN
0.0000 [IU] | Freq: Three times a day (TID) | SUBCUTANEOUS | Status: DC
  Administered 2018-09-04 – 2018-09-06 (×6): 1 [IU] via SUBCUTANEOUS

## 2018-09-03 MED ORDER — OMEGA-3-ACID ETHYL ESTERS 1 G PO CAPS
2.0000 g | ORAL_CAPSULE | Freq: Two times a day (BID) | ORAL | Status: DC
  Administered 2018-09-03 – 2018-09-06 (×6): 2 g via ORAL
  Filled 2018-09-03 (×6): qty 2

## 2018-09-03 MED ORDER — POTASSIUM CHLORIDE CRYS ER 20 MEQ PO TBCR: 20.0000 meq | EXTENDED_RELEASE_TABLET | Freq: Every day | ORAL | Status: DC

## 2018-09-03 MED ORDER — FUROSEMIDE 10 MG/ML IJ SOLN
40.0000 mg | Freq: Once | INTRAMUSCULAR | Status: AC
  Administered 2018-09-03: 40 mg via INTRAVENOUS
  Filled 2018-09-03: qty 4

## 2018-09-03 MED ORDER — INFLUENZA VAC SPLIT HIGH-DOSE 0.5 ML IM SUSY
0.5000 mL | PREFILLED_SYRINGE | INTRAMUSCULAR | Status: AC
  Administered 2018-09-04: 0.5 mL via INTRAMUSCULAR
  Filled 2018-09-03: qty 0.5

## 2018-09-03 MED ORDER — FAMOTIDINE 20 MG PO TABS
20.0000 mg | ORAL_TABLET | Freq: Every day | ORAL | Status: DC
  Administered 2018-09-04 – 2018-09-06 (×3): 20 mg via ORAL
  Filled 2018-09-03 (×3): qty 1

## 2018-09-03 MED ORDER — MOMETASONE FURO-FORMOTEROL FUM 200-5 MCG/ACT IN AERO
2.0000 | INHALATION_SPRAY | Freq: Two times a day (BID) | RESPIRATORY_TRACT | Status: DC
  Administered 2018-09-03 – 2018-09-06 (×5): 2 via RESPIRATORY_TRACT
  Filled 2018-09-03: qty 8.8

## 2018-09-03 MED ORDER — INSULIN ASPART 100 UNIT/ML ~~LOC~~ SOLN: 0.0000 [IU] | Freq: Every day | SUBCUTANEOUS | Status: DC

## 2018-09-03 MED ORDER — PANTOPRAZOLE SODIUM 40 MG PO TBEC
40.0000 mg | DELAYED_RELEASE_TABLET | Freq: Every day | ORAL | Status: DC
  Administered 2018-09-04 – 2018-09-06 (×3): 40 mg via ORAL
  Filled 2018-09-03 (×3): qty 1

## 2018-09-03 MED ORDER — ACETAMINOPHEN 650 MG RE SUPP: 650.0000 mg | Freq: Four times a day (QID) | RECTAL | Status: DC | PRN

## 2018-09-03 MED ORDER — FUROSEMIDE 10 MG/ML IJ SOLN
40.0000 mg | Freq: Two times a day (BID) | INTRAMUSCULAR | Status: DC
  Administered 2018-09-03: 40 mg via INTRAVENOUS
  Filled 2018-09-03: qty 4

## 2018-09-03 MED ORDER — ACETAMINOPHEN 325 MG PO TABS: 650.0000 mg | ORAL_TABLET | Freq: Four times a day (QID) | ORAL | Status: DC | PRN

## 2018-09-03 MED ORDER — ONDANSETRON HCL 4 MG/2ML IJ SOLN: 4.0000 mg | Freq: Four times a day (QID) | INTRAMUSCULAR | Status: DC | PRN

## 2018-09-03 MED ORDER — CLOPIDOGREL BISULFATE 75 MG PO TABS
75.0000 mg | ORAL_TABLET | Freq: Every day | ORAL | Status: DC
  Administered 2018-09-04 – 2018-09-06 (×3): 75 mg via ORAL
  Filled 2018-09-03 (×3): qty 1

## 2018-09-03 MED ORDER — METOPROLOL TARTRATE 25 MG PO TABS
12.5000 mg | ORAL_TABLET | Freq: Two times a day (BID) | ORAL | Status: DC
  Administered 2018-09-04 – 2018-09-06 (×5): 12.5 mg via ORAL
  Filled 2018-09-03 (×6): qty 1

## 2018-09-03 MED ORDER — ONDANSETRON HCL 4 MG PO TABS: 4.0000 mg | ORAL_TABLET | Freq: Four times a day (QID) | ORAL | Status: DC | PRN

## 2018-09-03 MED ORDER — METHYLPREDNISOLONE SODIUM SUCC 40 MG IJ SOLR
40.0000 mg | Freq: Two times a day (BID) | INTRAMUSCULAR | Status: DC
  Administered 2018-09-03 – 2018-09-05 (×4): 40 mg via INTRAVENOUS
  Filled 2018-09-03 (×4): qty 1

## 2018-09-03 MED ORDER — ATORVASTATIN CALCIUM 40 MG PO TABS
80.0000 mg | ORAL_TABLET | Freq: Every day | ORAL | Status: DC
  Administered 2018-09-04 – 2018-09-06 (×3): 80 mg via ORAL
  Filled 2018-09-03 (×3): qty 2

## 2018-09-03 MED ORDER — IPRATROPIUM-ALBUTEROL 0.5-2.5 (3) MG/3ML IN SOLN
3.0000 mL | Freq: Once | RESPIRATORY_TRACT | Status: AC
  Administered 2018-09-03: 3 mL via RESPIRATORY_TRACT
  Filled 2018-09-03: qty 3

## 2018-09-03 MED ORDER — LINACLOTIDE 145 MCG PO CAPS: 145.0000 ug | ORAL_CAPSULE | Freq: Every day | ORAL | Status: DC | PRN

## 2018-09-03 NOTE — ED Notes (Signed)
Dr. Brigitte Pulse in room

## 2018-09-03 NOTE — H&P (Signed)
History and Physical    Erica [name] EPP:[id number] DOB: 20-Apr-1951 DOA: 09/03/2018  PCP: Iona Beard, MD   Patient coming from: Home  Chief Complaint: Chest pain and dyspnea  HPI: Erica [name] is a 67 y.o. female with medical history significant for COPD on home 2 L oxygen, diastolic CHF with prior echocardiogram on 11/2017 with LVEF 60 to 65%, type 2 diabetes, dyslipidemia, and hypertension who presented to the ED with some worsening chest pain as well as shortness of breath that has been worsening throughout the week.  The chest pain is substernal in nature and began this morning.  It is not alleviated or exacerbated with anything she does in particular.  She has had a mild cough that is nonproductive and she denies any fevers or chills.  There is no radiation of pain.  She states that she has been taking all of her usual home medications to include Lasix daily.   ED Course: Vital signs are stable.  She appears to have oxygen desaturation into the mid 80th percentile on her usual home 2 L and therefore has been turned to 3 L nasal cannula with better saturation.  There appears to be no respiratory distress.  Laboratory data with stable creatinine of 1.72 with baseline of 1.2-1.7.  EKG with no unusual findings and troponin is less than 0.03.  Two-view chest x-ray with findings of CHF and mild interstitial lung disease.  She has been given a breathing treatment with some improvement noted and is due to receive some Lasix.  Review of Systems: All others reviewed and otherwise negative.  Past Medical History:  Diagnosis Date  . Arthritis   . CHF (congestive heart failure) ([place])   . Chronic back pain   . COPD (chronic obstructive pulmonary disease) (HCC)    2L home O2  . Diabetes mellitus without complication ([place])   . Hyperlipidemia   . Hypertension   . Stroke (cerebrum) [facility])     Past Surgical History:  Procedure Laterality Date  . ABDOMINAL HYSTERECTOMY    . colonoscopy   2009   Dr. Gala Romney: cluster of diminutive rectal polyps and 2 diminutive polyps in rectosigmoid junction, hyperplastic  . COLONOSCOPY WITH PROPOFOL N/A 10/18/2017   Procedure: COLONOSCOPY WITH PROPOFOL;  Surgeon: Daneil Dolin, MD;  [facility];  Service: Endoscopy;  Laterality: N/A;  9:30am  . ESOPHAGOGASTRODUODENOSCOPY N/A 01/25/2018   Procedure: ESOPHAGOGASTRODUODENOSCOPY (EGD);  Surgeon: Danie Binder, MD;  Location: [facility];  Service: Endoscopy;  Laterality: N/A;  . ESOPHAGOGASTRODUODENOSCOPY (EGD) WITH PROPOFOL N/A 08/29/2017   Dr. Gala Romney with propofol: small hiatal hernia. 4cm fundal diverticulum.   Marland Kitchen GIVENS CAPSULE STUDY N/A 01/25/2018   Procedure: GIVENS CAPSULE STUDY;  Surgeon: Danie Binder, MD;  Location: [facility];  Service: Endoscopy;  Laterality: N/A;  . left hand middle finger     in an accident at work, amputated     reports that she quit smoking about a year ago. Her smoking use included cigarettes. She has a 52.00 pack-year smoking history. She has never used smokeless tobacco. She reports that she does not drink alcohol or use drugs.  No Known Allergies  Family History  Problem Relation Age of Onset  . CVA Mother 11  . Cancer Neg Hx   . Colon cancer Neg Hx     Prior to Admission medications   Medication Sig Start Date End Date Taking? Authorizing Provider  amLODipine (NORVASC) 10 MG tablet Take 1 tablet  by mouth daily. 12/27/17  Yes [provider]  atorvastatin (LIPITOR) 80 MG tablet Take 80 mg by mouth daily.  12/04/16  Yes [provider]  budesonide-formoterol (SYMBICORT) 160-4.5 MCG/ACT inhaler Inhale 2 puffs into the lungs 2 (two) times daily. 11/23/15  Yes Barton Dubois, MD  buPROPion [facility] SR) 150 MG 12 hr tablet Take 150 mg by mouth 2 (two) times daily. 12/04/16  Yes [provider]  cetirizine (ZYRTEC) 10 MG tablet Take 1 tablet by mouth daily. 01/28/18  Yes [provider]  Cholecalciferol (VITAMIN  D3) 50 MCG (2000 UT) CHEW  07/08/18  Yes [provider]  clopidogrel (PLAVIX) 75 MG tablet Take 75 mg by mouth daily.  03/29/18  Yes [provider]  famotidine (PEPCID) 20 MG tablet Take 20 mg by mouth 2 (two) times daily.  03/29/18  Yes [provider]  ferrous sulfate 325 (65 FE) MG EC tablet Take 1 tablet (325 mg total) by mouth 2 (two) times daily. 01/25/18 09/03/18 Yes Erline Hau, MD  furosemide (LASIX) 40 MG tablet Take 1 tablet (40 mg total) by mouth 2 (two) times daily. Patient taking differently: Take 40 mg by mouth daily.  03/10/18  Yes Johnson, Clanford L, MD  ipratropium-albuterol (DUONEB) 0.5-2.5 (3) MG/3ML SOLN Take 3 mLs by nebulization every 6 (six) hours as needed (for shortness of breath).   Yes [provider]  Linaclotide (LINZESS) 145 MCG CAPS capsule Take 145 mcg by mouth daily as needed (for constipation).    Yes [provider]  metoprolol tartrate (LOPRESSOR) 25 MG tablet Take 0.5 tablets (12.5 mg total) by mouth 2 (two) times daily. Patient taking differently: Take 12.5 mg by mouth daily.  08/30/17  Yes Tat, Shanon Brow, MD  omega-3 acid ethyl esters (LOVAZA) 1 g capsule Take 2 g by mouth 2 (two) times daily.  12/07/16  Yes [provider]  pantoprazole (PROTONIX) 40 MG tablet Take 1 tablet (40 mg total) by mouth daily. 01/25/18  Yes Isaac Bliss, Rayford Halsted, MD  potassium chloride SA (K-DUR,KLOR-CON) 20 MEQ tablet Take 2 tablets (40 mEq total) by mouth daily. Patient taking differently: Take 20 mEq by mouth daily.  03/10/18 09/03/18 Yes Johnson, Clanford L, MD  predniSONE (DELTASONE) 20 MG tablet Take 3 PO QAM x3days, 2 PO QAM x3days, 1 PO QAM x3days Patient taking differently: Take 20 mg by mouth daily. Take 3 PO QAM x3days, 2 PO QAM x3days, 1 PO QAM x3days 03/10/18  Yes Johnson, Clanford L, MD  saxagliptin HCl (ONGLYZA) 5 MG TABS tablet Take 5 mg by mouth daily. Reported on 09/22/2015   Yes [provider]    SPIRIVA HANDIHALER 18 MCG inhalation capsule Take 1 puff by mouth daily. 10/31/17  Yes [provider]  valsartan-hydrochlorothiazide (DIOVAN-HCT) 160-25 MG tablet Take 1 tablet by mouth daily. 12/27/17  Yes [provider]    Physical Exam: Vitals:   09/03/18 1430 09/03/18 1445 09/03/18 1500 09/03/18 1501  BP: (!) 126/51  (!) 110/41   Pulse: 74 73  77  Resp: (!) 21 18 20  (!) 22  Temp:      TempSrc:      SpO2: 91% 92%  93%  Weight:      Height:        Constitutional: NAD, calm, comfortable Vitals:   09/03/18 1430 09/03/18 1445 09/03/18 1500 09/03/18 1501  BP: (!) 126/51  (!) 110/41   Pulse: 74 73  77  Resp: (!) 21 18 20  Marland Kitchen)  22  Temp:      TempSrc:      SpO2: 91% 92%  93%  Weight:      Height:       Eyes: lids and conjunctivae normal ENMT: Mucous membranes are moist.  Neck: normal, supple Respiratory: Wheezing and mild crackles noted to bilateral lung bases. Normal respiratory effort. No accessory muscle use.  Currently on 3 L nasal cannula with saturations of 95 to 97% Cardiovascular: Regular rate and rhythm, no murmurs. No extremity edema. Abdomen: no tenderness, no distention. Bowel sounds positive.  Musculoskeletal:  No joint deformity upper and lower extremities.   Skin: no rashes, lesions, ulcers.  Psychiatric: Normal judgment and insight. Alert and oriented x 3. Normal mood.   Labs on Admission: I have personally reviewed following labs and imaging studies  CBC: Recent Labs  Lab 09/03/18 1121  WBC 4.0  NEUTROABS 2.4  HGB 13.5  HCT 44.4  MCV 89.2  PLT 546   Basic Metabolic Panel: Recent Labs  Lab 09/03/18 1121  NA 142  K 4.2  CL 107  CO2 29  GLUCOSE 127*  BUN 27*  CREATININE 1.72*  CALCIUM 8.7*   GFR: Estimated Creatinine Clearance: 31.5 mL/min (A) (by C-G formula based on SCr of 1.72 mg/dL (H)). Liver Function Tests: Recent Labs  Lab 09/03/18 1121  AST 8*  ALT 7  ALKPHOS 67  BILITOT 0.6  PROT 6.8  ALBUMIN 3.7   No  results for input(s): LIPASE, AMYLASE in the last 168 hours. No results for input(s): AMMONIA in the last 168 hours. Coagulation Profile: No results for input(s): INR, PROTIME in the last 168 hours. Cardiac Enzymes: Recent Labs  Lab 09/03/18 1121  TROPONINI <0.03   BNP (last 3 results) No results for input(s): PROBNP in the last 8760 hours. HbA1C: No results for input(s): HGBA1C in the last 72 hours. CBG: No results for input(s): GLUCAP in the last 168 hours. Lipid Profile: No results for input(s): CHOL, HDL, LDLCALC, TRIG, CHOLHDL, LDLDIRECT in the last 72 hours. Thyroid Function Tests: No results for input(s): TSH, T4TOTAL, FREET4, T3FREE, THYROIDAB in the last 72 hours. Anemia Panel: No results for input(s): VITAMINB12, FOLATE, FERRITIN, TIBC, IRON, RETICCTPCT in the last 72 hours. Urine analysis:    Component Value Date/Time   COLORURINE YELLOW 03/14/2013 1847   APPEARANCEUR CLEAR 03/14/2013 1847   LABSPEC 1.025 03/14/2013 1847   PHURINE 6.0 03/14/2013 1847   GLUCOSEU NEGATIVE 03/14/2013 1847   HGBUR NEGATIVE 03/14/2013 1847   BILIRUBINUR NEGATIVE 03/14/2013 1847   KETONESUR NEGATIVE 03/14/2013 1847   PROTEINUR NEGATIVE 03/14/2013 1847   UROBILINOGEN 0.2 03/14/2013 1847   NITRITE NEGATIVE 03/14/2013 1847   LEUKOCYTESUR NEGATIVE 03/14/2013 1847    Radiological Exams on Admission: Dg Chest 2 View  Result Date: 09/03/2018 CLINICAL DATA:  Chest pain and shortness of breath. EXAM: CHEST - 2 VIEW COMPARISON:  Chest x-rays dated 04/11/2018 and 03/10/2018 and 01/23/2018 and CT scan of the chest dated 12/19/2017 FINDINGS: Since the most recent study, there's recurrent increased accentuation of the interstitial markings in both lungs as well as increased pulmonary vascular prominence. There's chronic mild cardiomegaly. Chronic blunting of the costophrenic angles. Chronic pleural calcification noted right upper lung zone. The patient has previously demonstrated interstitial lung  disease. Aortic atherosclerosis. No acute bone abnormality. IMPRESSION: Findings consistent with congestive heart failure superimposed on chronic interstitial lung disease. Electronically Signed   By: Lorriane Shire M.D.   On: 09/03/2018 12:29    EKG: Independently reviewed.  Sinus rhythm at 70 bpm with left atrial enlargement.  Assessment/Plan Principal Problem:   Acute hypoxemic respiratory failure (HCC) Active Problems:   COPD exacerbation (HCC)   Benign essential HTN   Type 2 diabetes mellitus with hyperglycemia (HCC)   CKD (chronic kidney disease)   Acute diastolic (congestive) heart failure ([place])   1. Acute hypoxemic respiratory failure- multifactorial.  This appears to be related to CHF and COPD exacerbation.  Continue to wean oxygen down to usual home 2 L with treatment as noted below. 2. Acute diastolic CHF exacerbation.  Continue diuresis with IV Lasix 40 mg twice daily and continue monitor daily weights and strict I's and O's.  Check 2D echocardiogram as prior was in 11/2017.  Hold valsartan/HCTZ and monitor blood pressures.  Maintain on beta-blocker.  Will check BNP.  Continue to monitor troponins. 3. Acute COPD exacerbation.  Maintain on Symbicort and hold Spiriva.  Maintain on duo nebs every 6 hours and IV methylprednisolone twice daily. 4. Essential hypertension.  Hold amlodipine and Diovan HCT in the face of aggressive diuresis due to some soft blood pressure readings.  Continue to monitor carefully.  Hydralazine IV as needed. 5. Type 2 diabetes.  Hold oral agents and maintain on SSI with carb modified diet. 6. CKD stage III.  Appears to be at baseline.  Continue to monitor repeat labs in a.m. 7. History of CVA.  Maintain on Lipitor and Plavix. 8. GERD.  Maintain on PPI.  DVT prophylaxis: Heparin Code Status: Full Family Communication: None at bedside Disposition Plan: Treatment of dyspnea with breathing treatment, steroids, and diuresis Consults called: None Admission  status: Inpatient, MedSurg   Raeleen Winstanley Darleen Crocker DO [facility] Pager [phone]  If 7PM-7AM, please contact night-coverage [email or web address] Password [email or web address]  09/03/2018, 3:41 PM

## 2018-09-03 NOTE — ED Notes (Signed)
Pt returned from xray

## 2018-09-03 NOTE — ED Notes (Signed)
Patient transported to X-ray 

## 2018-09-03 NOTE — ED Triage Notes (Signed)
Pt on 3L, SOB, chest pain this morning, cough and congestion all week

## 2018-09-03 NOTE — ED Provider Notes (Signed)
[facility] EMERGENCY DEPARTMENT Provider Note   CSN: [id number] Arrival date & time: 09/03/18  1100     History   Chief Complaint Chief Complaint  Patient presents with  . Chest Pain    HPI Erica [name] is a 67 y.o. female.  HPI Patient presents with chest pain and shortness of breath.  States she has been coughing with some clear sputum production.  Worsening shortness of breath.  On 2 L at baseline but is had to turn up to 3 L.  Some dull chest tightness.  No fevers.  States she feels as if her volume could be up and she has a history of CHF.  No real swelling in her legs. Past Medical History:  Diagnosis Date  . Arthritis   . CHF (congestive heart failure) ([place])   . Chronic back pain   . COPD (chronic obstructive pulmonary disease) (HCC)    2L home O2  . Diabetes mellitus without complication ([place])   . Hyperlipidemia   . Hypertension   . Stroke (cerebrum) [facility])     Patient Active Problem List   Diagnosis Date Noted  . Acute diastolic (congestive) heart failure ([place]) 03/09/2018  . AVM (arteriovenous malformation) of duodenum, acquired   . GIB (gastrointestinal bleeding) 01/23/2018  . CKD (chronic kidney disease) 01/23/2018  . Hyperkalemia 12/21/2017  . Hyperlipidemia 12/18/2017  . Hypomagnesemia 12/17/2017  . Acute renal failure superimposed on stage 3 chronic kidney disease ([place]) 12/07/2017  . Acute on chronic respiratory failure with hypoxia ([place]) 12/07/2017  . COPD with acute exacerbation ([place]) 12/07/2017  . Acute diastolic CHF (congestive heart failure) ([place]) 09/19/2017  . Iron deficiency anemia due to chronic blood loss   . Acute diverticulitis 08/29/2017  . Heme positive stool   . Symptomatic anemia   . GI bleed 08/27/2017  . Elevated troponin 08/27/2017  . (HFpEF) heart failure with preserved ejection fraction ([place]) 08/27/2017  . Acute dyspnea   . Acute on chronic respiratory failure ([place]) 02/11/2017  . Acute on chronic diastolic CHF (congestive  heart failure) ([place]) 02/11/2017  . Benign essential HTN 11/20/2015  . GERD (gastroesophageal reflux disease) 11/20/2015  . Tobacco abuse 11/20/2015  . Type 2 diabetes mellitus with hyperglycemia ([place]) 11/20/2015  . COPD exacerbation ([place]) 09/05/2015  . Chronic pain syndrome 09/05/2015  . Hypertension 09/05/2015  . Diabetes mellitus type 2, controlled ([place]) 09/05/2015  . Chronic respiratory failure ([place]) 09/05/2015  . OLECRANON BURSITIS, LEFT 08/04/2010    Past Surgical History:  Procedure Laterality Date  . ABDOMINAL HYSTERECTOMY    . colonoscopy  2009   Dr. Gala Romney: cluster of diminutive rectal polyps and 2 diminutive polyps in rectosigmoid junction, hyperplastic  . COLONOSCOPY WITH PROPOFOL N/A 10/18/2017   Procedure: COLONOSCOPY WITH PROPOFOL;  Surgeon: Daneil Dolin, MD;  Location[facility];  Service: Endoscopy;  Laterality: N/A;  9:30am  . ESOPHAGOGASTRODUODENOSCOPY N/A 01/25/2018   Procedure: ESOPHAGOGASTRODUODENOSCOPY (EGD);  Surgeon: Danie Binder, MD;  Location: [facility];  Service: Endoscopy;  Laterality: N/A;  . ESOPHAGOGASTRODUODENOSCOPY (EGD) WITH PROPOFOL N/A 08/29/2017   Dr. Gala Romney with propofol: small hiatal hernia. 4cm fundal diverticulum.   Marland Kitchen GIVENS CAPSULE STUDY N/A 01/25/2018   Procedure: GIVENS CAPSULE STUDY;  Surgeon: Danie Binder, MD;  Location: [facility];  Service: Endoscopy;  Laterality: N/A;  . left hand middle finger     in an accident at work, amputated     OB History    Gravida  0  Para  0   Term  0   Preterm  0   AB  0   Living        SAB  0   TAB  0   Ectopic  0   Multiple      Live Births               Home Medications    Prior to Admission medications   Medication Sig Start Date End Date Taking? Authorizing Provider  amLODipine (NORVASC) 10 MG tablet Take 1 tablet by mouth daily. 12/27/17  Yes [provider]  atorvastatin (LIPITOR) 80 MG tablet Take 80 mg by mouth daily.  12/04/16  Yes [provider]  budesonide-formoterol (SYMBICORT) 160-4.5 MCG/ACT inhaler Inhale 2 puffs into the lungs 2 (two) times daily. 11/23/15  Yes Barton Dubois, MD  buPROPion [facility] SR) 150 MG 12 hr tablet Take 150 mg by mouth 2 (two) times daily. 12/04/16  Yes [provider]  cetirizine (ZYRTEC) 10 MG tablet Take 1 tablet by mouth daily. 01/28/18  Yes [provider]  Cholecalciferol (VITAMIN D3) 50 MCG (2000 UT) CHEW  07/08/18  Yes [provider]  clopidogrel (PLAVIX) 75 MG tablet Take 75 mg by mouth daily.  03/29/18  Yes [provider]  famotidine (PEPCID) 20 MG tablet Take 20 mg by mouth 2 (two) times daily.  03/29/18  Yes [provider]  ferrous sulfate 325 (65 FE) MG EC tablet Take 1 tablet (325 mg total) by mouth 2 (two) times daily. 01/25/18 09/03/18 Yes Erline Hau, MD  furosemide (LASIX) 40 MG tablet Take 1 tablet (40 mg total) by mouth 2 (two) times daily. Patient taking differently: Take 40 mg by mouth daily.  03/10/18  Yes Johnson, Clanford L, MD  ipratropium-albuterol (DUONEB) 0.5-2.5 (3) MG/3ML SOLN Take 3 mLs by nebulization every 6 (six) hours as needed (for shortness of breath).   Yes [provider]  Linaclotide (LINZESS) 145 MCG CAPS capsule Take 145 mcg by mouth daily as needed (for constipation).    Yes [provider]  metoprolol tartrate (LOPRESSOR) 25 MG tablet Take 0.5 tablets (12.5 mg total) by mouth 2 (two) times daily. Patient taking differently: Take 12.5 mg by mouth daily.  08/30/17  Yes Tat, Shanon Brow, MD  omega-3 acid ethyl esters (LOVAZA) 1 g capsule Take 2 g by mouth 2 (two) times daily.  12/07/16  Yes [provider]  pantoprazole (PROTONIX) 40 MG tablet Take 1 tablet (40 mg total) by mouth daily. 01/25/18  Yes Isaac Bliss, Rayford Halsted, MD  potassium chloride SA (K-DUR,KLOR-CON) 20 MEQ tablet Take 2 tablets (40 mEq total) by mouth daily. Patient taking differently: Take 20 mEq by mouth daily.   03/10/18 09/03/18 Yes Johnson, Clanford L, MD  predniSONE (DELTASONE) 20 MG tablet Take 3 PO QAM x3days, 2 PO QAM x3days, 1 PO QAM x3days Patient taking differently: Take 20 mg by mouth daily. Take 3 PO QAM x3days, 2 PO QAM x3days, 1 PO QAM x3days 03/10/18  Yes Johnson, Clanford L, MD  saxagliptin HCl (ONGLYZA) 5 MG TABS tablet Take 5 mg by mouth daily. Reported on 09/22/2015   Yes [provider]  SPIRIVA HANDIHALER 18 MCG inhalation capsule Take 1 puff by mouth daily. 10/31/17  Yes [provider]  valsartan-hydrochlorothiazide (DIOVAN-HCT) 160-25 MG tablet Take 1 tablet by mouth daily. 12/27/17  Yes [provider]    Family History Family History  Problem Relation Age of Onset  .  CVA Mother 26  . Cancer Neg Hx   . Colon cancer Neg Hx     Social History Social History   Tobacco Use  . Smoking status: Former Smoker    Packs/day: 1.00    Years: 52.00    Pack years: 52.00    Types: Cigarettes    Last attempt to quit: 09/23/2017    Years since quitting: 0.9  . Smokeless tobacco: Never Used  Substance Use Topics  . Alcohol use: No  . Drug use: No     Allergies   Patient has no known allergies.   Review of Systems Review of Systems  Constitutional: Negative for chills and fever.  Respiratory: Positive for cough and shortness of breath.   Cardiovascular: Positive for chest pain. Negative for palpitations.  Gastrointestinal: Negative for abdominal pain.  Genitourinary: Negative for flank pain.  Musculoskeletal: Negative for back pain.  Skin: Negative for rash.  Neurological: Negative for weakness.  Hematological: Negative for adenopathy.  Psychiatric/Behavioral: Negative for confusion.     Physical Exam Updated Vital Signs BP (!) 116/54   Pulse 74   Temp 98 F (36.7 C) (Oral)   Resp 17   Ht 5\' 4"  (1.626 m)   Wt 79.8 kg   SpO2 91%   BMI 30.21 kg/m   Physical Exam  Constitutional: She appears well-developed.  HENT:  Head:  Normocephalic.  Neck: Normal range of motion.  Cardiovascular: Regular rhythm.  Pulmonary/Chest:  Diffuse harsh breath sounds with wheezes and prolonged expirations.  Abdominal: Soft. There is no tenderness.  Musculoskeletal:       Right lower leg: She exhibits no edema.       Left lower leg: She exhibits no edema.  Neurological: She is alert.  Skin: Skin is warm. Capillary refill takes less than 2 seconds.     ED Treatments / Results  Labs (all labs ordered are listed, but only abnormal results are displayed) Labs Reviewed  COMPREHENSIVE METABOLIC PANEL - Abnormal; Notable for the following components:      Result Value   Glucose, Bld 127 (*)    BUN 27 (*)    Creatinine, Ser 1.72 (*)    Calcium 8.7 (*)    AST 8 (*)    GFR calc non Af Amer 30 (*)    GFR calc Af Amer 35 (*)    All other components within normal limits  CBC WITH DIFFERENTIAL/PLATELET - Abnormal; Notable for the following components:   RDW 16.2 (*)    All other components within normal limits  TROPONIN I    EKG EKG Interpretation  Date/Time:  Tuesday September 03 2018 11:12:24 EST Ventricular Rate:  78 PR Interval:    QRS Duration: 105 QT Interval:  394 QTC Calculation: 449 R Axis:   69 Text Interpretation:  Sinus rhythm LAE, consider biatrial enlargement RSR' in V1 or V2, probably normal variant Minimal ST depression, inferior leads Borderline ST elevation, lateral leads Confirmed by Davonna Belling [phone]) on 09/03/2018 12:41:50 PM   Radiology Dg Chest 2 View  Result Date: 09/03/2018 CLINICAL DATA:  Chest pain and shortness of breath. EXAM: CHEST - 2 VIEW COMPARISON:  Chest x-rays dated 04/11/2018 and 03/10/2018 and 01/23/2018 and CT scan of the chest dated 12/19/2017 FINDINGS: Since the most recent study, there's recurrent increased accentuation of the interstitial markings in both lungs as well as increased pulmonary vascular prominence. There's chronic mild cardiomegaly. Chronic blunting of the  costophrenic angles. Chronic pleural calcification noted right upper lung zone.  The patient has previously demonstrated interstitial lung disease. Aortic atherosclerosis. No acute bone abnormality. IMPRESSION: Findings consistent with congestive heart failure superimposed on chronic interstitial lung disease. Electronically Signed   By: Lorriane Shire M.D.   On: 09/03/2018 12:29    Procedures Procedures (including critical care time)  Medications Ordered in ED Medications  furosemide (LASIX) injection 40 mg (has no administration in time range)  ipratropium-albuterol (DUONEB) 0.5-2.5 (3) MG/3ML nebulizer solution 3 mL (3 mLs Nebulization Given 09/03/18 1149)     Initial Impression / Assessment and Plan / ED Course  I have reviewed the triage vital signs and the nursing notes.  Pertinent labs & imaging results that were available during my care of the patient were reviewed by me and considered in my medical decision making (see chart for details).     Patient presents with shortness of breath.  Has had for the last few days.  Has had to increase her home oxygen.  She is on 3 L here and sats were going to the upper 80s just at rest.  Also had cough with some sputum production.  X-ray shows more volume overload on chronic lung disease.  With chronic lung disease and borderline oxygenation I think patient benefit from admission to the hospital.  IV Lasix given and given breathing treatments.  Final Clinical Impressions(s) / ED Diagnoses   Final diagnoses:  Acute on chronic congestive heart failure, unspecified heart failure type [facility])  Chronic obstructive pulmonary disease, unspecified COPD type [facility])    ED Discharge Orders    None       Davonna Belling, MD 09/03/18 1441

## 2018-09-03 NOTE — ED Notes (Signed)
ED Provider at bedside. 

## 2018-09-04 ENCOUNTER — Other Ambulatory Visit (HOSPITAL_COMMUNITY): Payer: Medicare HMO

## 2018-09-04 ENCOUNTER — Inpatient Hospital Stay (HOSPITAL_COMMUNITY): Payer: Medicare HMO

## 2018-09-04 DIAGNOSIS — I5031 Acute diastolic (congestive) heart failure: Secondary | ICD-10-CM

## 2018-09-04 DIAGNOSIS — N179 Acute kidney failure, unspecified: Secondary | ICD-10-CM

## 2018-09-04 DIAGNOSIS — J9601 Acute respiratory failure with hypoxia: Secondary | ICD-10-CM

## 2018-09-04 DIAGNOSIS — E1165 Type 2 diabetes mellitus with hyperglycemia: Secondary | ICD-10-CM

## 2018-09-04 DIAGNOSIS — J441 Chronic obstructive pulmonary disease with (acute) exacerbation: Principal | ICD-10-CM

## 2018-09-04 DIAGNOSIS — I10 Essential (primary) hypertension: Secondary | ICD-10-CM

## 2018-09-04 LAB — CBC
HCT: 43 % (ref 36.0–46.0)
Hemoglobin: 13 g/dL (ref 12.0–15.0)
MCH: 27.7 pg (ref 26.0–34.0)
MCHC: 30.2 g/dL (ref 30.0–36.0)
MCV: 91.5 fL (ref 80.0–100.0)
NRBC: 0 % (ref 0.0–0.2)
PLATELETS: 227 10*3/uL (ref 150–400)
RBC: 4.7 MIL/uL (ref 3.87–5.11)
RDW: 15.5 % (ref 11.5–15.5)
WBC: 3.8 10*3/uL — ABNORMAL LOW (ref 4.0–10.5)

## 2018-09-04 LAB — BASIC METABOLIC PANEL
Anion gap: 10 (ref 5–15)
BUN: 30 mg/dL — ABNORMAL HIGH (ref 8–23)
CO2: 26 mmol/L (ref 22–32)
Calcium: 8.4 mg/dL — ABNORMAL LOW (ref 8.9–10.3)
Chloride: 105 mmol/L (ref 98–111)
Creatinine, Ser: 2.42 mg/dL — ABNORMAL HIGH (ref 0.44–1.00)
GFR calc non Af Amer: 20 mL/min — ABNORMAL LOW (ref >60)
GFR, EST AFRICAN AMERICAN: 23 mL/min — AB (ref >60)
Glucose, Bld: 178 mg/dL — ABNORMAL HIGH (ref 70–99)
Potassium: 5 mmol/L (ref 3.5–5.1)
Sodium: 141 mmol/L (ref 135–145)

## 2018-09-04 LAB — GLUCOSE, CAPILLARY
Glucose-Capillary: 133 mg/dL — ABNORMAL HIGH (ref 70–99)
Glucose-Capillary: 128 mg/dL — ABNORMAL HIGH (ref 70–99)
Glucose-Capillary: 134 mg/dL — ABNORMAL HIGH (ref 70–99)
Glucose-Capillary: 136 mg/dL — ABNORMAL HIGH (ref 70–99)

## 2018-09-04 LAB — TROPONIN I: Troponin I: <0.03 ng/mL (ref <0.03)

## 2018-09-04 LAB — ECHOCARDIOGRAM COMPLETE
Height: 64 in
Weight: 2627.88 oz

## 2018-09-04 LAB — MAGNESIUM: Magnesium: 1.9 mg/dL (ref 1.7–2.4)

## 2018-09-04 MED ORDER — ALBUTEROL SULFATE (2.5 MG/3ML) 0.083% IN NEBU: 2.5000 mg | INHALATION_SOLUTION | RESPIRATORY_TRACT | Status: DC | PRN

## 2018-09-04 MED ORDER — AZITHROMYCIN 250 MG PO TABS
500.0000 mg | ORAL_TABLET | Freq: Every day | ORAL | Status: AC
  Administered 2018-09-04: 500 mg via ORAL
  Filled 2018-09-04: qty 2

## 2018-09-04 MED ORDER — AZITHROMYCIN 250 MG PO TABS
250.0000 mg | ORAL_TABLET | Freq: Every day | ORAL | Status: DC
  Administered 2018-09-05 – 2018-09-06 (×2): 250 mg via ORAL
  Filled 2018-09-04 (×2): qty 1

## 2018-09-04 NOTE — Progress Notes (Signed)
o2 increased from 2lpm to 3lpm due to decreased spo2 of 87%

## 2018-09-04 NOTE — Progress Notes (Signed)
Pt spo2 reassessed due to decreased o2 spo2 94% on 4lpm

## 2018-09-04 NOTE — Progress Notes (Signed)
PROGRESS NOTE  DEA BITTING  XFG:[id number] DOB: Jul 17, 1951 DOA: 09/03/2018 PCP: Iona Beard, MD   Brief Narrative: Erica [name] is a 67 y.o. female with a history of 2L-home-O2-dependent COPD, dCHF, T2DM, dyslipidemia, and HTN who presented 12/10 with a week of worsening dyspnea associated with 1 day of substernal chest pain associated with nonproductive cough. She was hypoxic on home O2, requiring 3-4LPM, troponin undetectable and no ischemic ECG findings. CXR demonstrated CHF on background of ILD. BNP 200. She was admitted and started on IV lasix and steroids, duonebs, and repeat echocardiogram ordered. Creatinine increased so lasix stopped and troponins have remained negative with resolution of chest pain. She continues to have wheezing and rhonchi, and hypoxia worse than baseline.   Assessment & Plan: Principal Problem:   Acute hypoxemic respiratory failure (HCC) Active Problems:   COPD exacerbation (HCC)   Benign essential HTN   Type 2 diabetes mellitus with hyperglycemia (HCC)   CKD (chronic kidney disease)   Acute diastolic (congestive) heart failure (HCC)  Acute on chronic hypoxic respiratory failure: Due to COPD exacerbation, possibly also CHF exacerbation though this is not the predominant issue.  - Continue IV steroids - Continue scheduled and prn breathing treatments - Continue dulera for symbicort - Wean O2 to 2L as able, aiming for SpO2 88-95%.   Acute on chronic HFpEF: BNP modestly elevated at 200. - Repeat echo.  - Lasix given without significant UOP but bump in creatinine. Not peripherally overloaded. Will stop lasix for right now and monitor BMP, weights, I/O, and echo update.  AKI on stage III CKD: Baseline creatinine is unclear as has been increasing lately, in July had AKI with Cr >4 when she left AMA from ED.  - Trend Cr in AM - Holding ARB, diuretics - Hold potassium supplement with borderline hyperkalemia and renal impairment.   T2DM:  - SSI,  carb-modified diet. At inpatient goal.   Essential hypertension.   - Hold amlodipine, valsartan, HCTZ. Continue IV hydralazine prn.   GERD:  - Continue PPI  History of CVA:  - Continue plavix, statin  DVT prophylaxis: Heparin Code Status: Full Family Communication: Son at bedside Disposition Plan: Home once respiratory status improved  Consultants:   None  Procedures:   Echocardiogram   Antimicrobials:  Azithromycin 12/11 - 12/15   Subjective: Shortness of breath remains moderate, worse with exertion, just moving around in bed, which is unusual for her. No chest pain or palpitations. Has not been urinating very much overnight.  Objective: Vitals:   09/04/18 0832 09/04/18 0950 09/04/18 1320 09/04/18 1403  BP: (!) 118/51   (!) 94/46  Pulse: 82   79  Resp:    17  Temp:    98.8 F (37.1 C)  TempSrc:    Oral  SpO2:  96% 96% 100%  Weight:      Height:       No intake or output data in the 24 hours ending 09/04/18 1406 Filed Weights   09/03/18 1107 09/04/18 0500 09/04/18 0636  Weight: 79.8 kg 73.5 kg 74.5 kg    Gen: 67 y.o. female in no distress Pulm: Non-labored on 4LPM. Globally decreased with pan-expiratory wheezing and rhonchi. No crackles.  CV: Regular rate and rhythm. No murmur, rub, or gallop. No JVD, no significant pedal edema. GI: Abdomen soft, non-tender, non-distended, with normoactive bowel sounds. No organomegaly or masses felt. Ext: Warm, no deformities Skin: No rashes, lesions no ulcers Neuro: Alert and oriented. No focal neurological deficits. Psych: Judgement  and insight appear normal. Mood & affect appropriate.   Data Reviewed: I have personally reviewed following labs and imaging studies  CBC: Recent Labs  Lab 09/03/18 1121 09/04/18 0353  WBC 4.0 3.8*  NEUTROABS 2.4  --   HGB 13.5 13.0  HCT 44.4 43.0  MCV 89.2 91.5  PLT 220 144   Basic Metabolic Panel: Recent Labs  Lab 09/03/18 1121 09/04/18 0353  NA 142 141  K 4.2 5.0  CL  107 105  CO2 29 26  GLUCOSE 127* 178*  BUN 27* 30*  CREATININE 1.72* 2.42*  CALCIUM 8.7* 8.4*  MG  --  1.9   GFR: Estimated Creatinine Clearance: 21.7 mL/min (A) (by C-G formula based on SCr of 2.42 mg/dL (H)). Liver Function Tests: Recent Labs  Lab 09/03/18 1121  AST 8*  ALT 7  ALKPHOS 67  BILITOT 0.6  PROT 6.8  ALBUMIN 3.7   No results for input(s): LIPASE, AMYLASE in the last 168 hours. No results for input(s): AMMONIA in the last 168 hours. Coagulation Profile: No results for input(s): INR, PROTIME in the last 168 hours. Cardiac Enzymes: Recent Labs  Lab 09/03/18 1121 09/03/18 1620 09/03/18 2155 09/04/18 0353  TROPONINI <0.03 <0.03 <0.03 <0.03   BNP (last 3 results) No results for input(s): PROBNP in the last 8760 hours. HbA1C: No results for input(s): HGBA1C in the last 72 hours. CBG: Recent Labs  Lab 09/03/18 1651 09/03/18 2148 09/04/18 0740 09/04/18 1120  GLUCAP 83 196* 133* 128*   Lipid Profile: No results for input(s): CHOL, HDL, LDLCALC, TRIG, CHOLHDL, LDLDIRECT in the last 72 hours. Thyroid Function Tests: No results for input(s): TSH, T4TOTAL, FREET4, T3FREE, THYROIDAB in the last 72 hours. Anemia Panel: No results for input(s): VITAMINB12, FOLATE, FERRITIN, TIBC, IRON, RETICCTPCT in the last 72 hours. Urine analysis:    Component Value Date/Time   COLORURINE YELLOW 03/14/2013 1847   APPEARANCEUR CLEAR 03/14/2013 1847   LABSPEC 1.025 03/14/2013 1847   PHURINE 6.0 03/14/2013 1847   GLUCOSEU NEGATIVE 03/14/2013 1847   HGBUR NEGATIVE 03/14/2013 1847   BILIRUBINUR NEGATIVE 03/14/2013 1847   KETONESUR NEGATIVE 03/14/2013 1847   PROTEINUR NEGATIVE 03/14/2013 1847   UROBILINOGEN 0.2 03/14/2013 1847   NITRITE NEGATIVE 03/14/2013 1847   LEUKOCYTESUR NEGATIVE 03/14/2013 1847   No results found for this or any previous visit (from the past 240 hour(s)).    Radiology Studies: Dg Chest 2 View  Result Date: 09/03/2018 CLINICAL DATA:  Chest pain  and shortness of breath. EXAM: CHEST - 2 VIEW COMPARISON:  Chest x-rays dated 04/11/2018 and 03/10/2018 and 01/23/2018 and CT scan of the chest dated 12/19/2017 FINDINGS: Since the most recent study, there's recurrent increased accentuation of the interstitial markings in both lungs as well as increased pulmonary vascular prominence. There's chronic mild cardiomegaly. Chronic blunting of the costophrenic angles. Chronic pleural calcification noted right upper lung zone. The patient has previously demonstrated interstitial lung disease. Aortic atherosclerosis. No acute bone abnormality. IMPRESSION: Findings consistent with congestive heart failure superimposed on chronic interstitial lung disease. Electronically Signed   By: Lorriane Shire M.D.   On: 09/03/2018 12:29    Scheduled Meds: . atorvastatin  80 mg Oral Daily  . buPROPion  150 mg Oral BID  . clopidogrel  75 mg Oral Daily  . famotidine  20 mg Oral Daily  . ferrous sulfate  325 mg Oral BID WC  . heparin  5,000 Units Subcutaneous Q8H  . insulin aspart  0-5 Units Subcutaneous QHS  .  insulin aspart  0-9 Units Subcutaneous TID WC  . ipratropium-albuterol  3 mL Nebulization Q6H  . loratadine  10 mg Oral Daily  . methylPREDNISolone (SOLU-MEDROL) injection  40 mg Intravenous Q12H  . metoprolol tartrate  12.5 mg Oral BID  . mometasone-formoterol  2 puff Inhalation BID  . omega-3 acid ethyl esters  2 g Oral BID  . pantoprazole  40 mg Oral Daily  . sodium chloride flush  3 mL Intravenous Q12H   Continuous Infusions: . sodium chloride       LOS: 1 day   Time spent: 25 minutes.  Patrecia Pour, MD [facility] [email or web address] Password [email or web address] 09/04/2018, 2:06 PM

## 2018-09-04 NOTE — Progress Notes (Signed)
*  PRELIMINARY RESULTS* Echocardiogram 2D Echocardiogram has been performed.  Erica [name] 09/04/2018, 4:14 PM

## 2018-09-05 ENCOUNTER — Other Ambulatory Visit (HOSPITAL_COMMUNITY): Payer: Medicare HMO

## 2018-09-05 ENCOUNTER — Inpatient Hospital Stay (HOSPITAL_COMMUNITY): Payer: Medicare HMO

## 2018-09-05 DIAGNOSIS — N183 Chronic kidney disease, stage 3 (moderate): Secondary | ICD-10-CM

## 2018-09-05 LAB — BASIC METABOLIC PANEL
Anion gap: 8 (ref 5–15)
BUN: 51 mg/dL — ABNORMAL HIGH (ref 8–23)
CHLORIDE: 103 mmol/L (ref 98–111)
CO2: 25 mmol/L (ref 22–32)
CREATININE: 2.83 mg/dL — AB (ref 0.44–1.00)
Calcium: 7.6 mg/dL — ABNORMAL LOW (ref 8.9–10.3)
GFR calc Af Amer: 19 mL/min — ABNORMAL LOW (ref >60)
GFR calc non Af Amer: 16 mL/min — ABNORMAL LOW (ref >60)
Glucose, Bld: 161 mg/dL — ABNORMAL HIGH (ref 70–99)
Potassium: 5.1 mmol/L (ref 3.5–5.1)
Sodium: 136 mmol/L (ref 135–145)

## 2018-09-05 LAB — GLUCOSE, CAPILLARY
GLUCOSE-CAPILLARY: 118 mg/dL — AB (ref 70–99)
Glucose-Capillary: 127 mg/dL — ABNORMAL HIGH (ref 70–99)
Glucose-Capillary: 125 mg/dL — ABNORMAL HIGH (ref 70–99)
Glucose-Capillary: 105 mg/dL — ABNORMAL HIGH (ref 70–99)

## 2018-09-05 MED ORDER — SODIUM POLYSTYRENE SULFONATE 15 GM/60ML PO SUSP
15.0000 g | Freq: Once | ORAL | Status: AC
  Administered 2018-09-05: 15 g via ORAL
  Filled 2018-09-05: qty 60

## 2018-09-05 MED ORDER — PREDNISONE 20 MG PO TABS
40.0000 mg | ORAL_TABLET | Freq: Every day | ORAL | Status: DC
  Administered 2018-09-06: 40 mg via ORAL
  Filled 2018-09-05: qty 2

## 2018-09-05 NOTE — Care Management Note (Signed)
Case Management Note  Patient Details  Name: Erica [name] MRN: [id number] Date of Birth: [birth year]  Subjective/Objective: Acute on chronic hypoxic respiratory failure: Due to COPD exacerbation, possibly also CHF exacerbation though this is not the predominant issue. From home, son lives with her. She has continuous oxygen with [employer]. She is independent, no assistive devices. Has PCP, still drives. Has insurance with prescription coverage, get medications filled at [facility] in [place]. No home health in the past.                    Action/Plan: DC home. No CM needs noted.   Expected Discharge Date:      09/06/2018            Expected Discharge Plan:  Home/Self Care  In-House Referral:     Discharge planning Services  CM Consult  Post Acute Care Choice:  NA Choice offered to:  NA  DME Arranged:    DME Agency:     HH Arranged:    HH Agency:     Status of Service:  Completed, signed off  If discussed at [employer] of Stay Meetings, dates discussed:    Additional Comments:  Shabana Armentrout, [name], RN 09/05/2018, 12:11 PM

## 2018-09-05 NOTE — Progress Notes (Addendum)
PROGRESS NOTE  [name]  YNW:[id number] DOB: 1951/06/28 DOA: 09/03/2018 PCP: Iona Beard, MD   Brief Narrative: Erica [name] is a 67 y.o. female with a history of 2L-home-O2-dependent COPD, dCHF, T2DM, dyslipidemia, and HTN who presented 12/10 with a week of worsening dyspnea associated with 1 day of substernal chest pain associated with nonproductive cough. She was hypoxic on home O2, requiring 3-4LPM, troponin undetectable and no ischemic ECG findings. CXR demonstrated CHF on background of ILD. BNP 200. She was admitted and started on IV lasix and steroids, duonebs, and repeat echocardiogram ordered. Creatinine increased so lasix stopped and troponins have remained negative with resolution of chest pain. She continues to have wheezing and rhonchi, and hypoxia worse than baseline.   Assessment & Plan: Principal Problem:   Acute hypoxemic respiratory failure (HCC) Active Problems:   COPD exacerbation (HCC)   Benign essential HTN   Type 2 diabetes mellitus with hyperglycemia (HCC)   CKD (chronic kidney disease)   Acute diastolic (congestive) heart failure (HCC)  Acute on chronic hypoxic respiratory failure: Due to COPD exacerbation, possibly also CHF exacerbation though this is not the predominant issue.  - Continue scheduled and prn breathing treatments - Continue dulera for symbicort - Wean O2 to 2L as able, aiming for SpO2 88-95%. Dropped < 88% last night requiring increase. Exam improved today, got AM steroids, so will deescalate to po steroids 12/13 and reattempt wean of O2.  Acute on chronic HFpEF: BNP modestly elevated at 200. Repeat echo showed EF 65%, indeterminate diastolic dysfunction, no wall motion abnormalities, and normal caliber IVC.  - Lasix given without significant UOP but bump in creatinine. I/O not documented accurately. Not planning redosing of lasix currently, not overloaded.   AKI on stage III CKD: Baseline creatinine is unclear as has been increasing  lately, in July had AKI with Cr 4.44 when she left AMA from ED. Was given diuretic and continued on ARB - Repeat BMP in AM - Holding ARB, diuretics - Hold potassium supplement with borderline hyperkalemia and renal impairment. Will give kayexalate x1 today.  - Check urinalysis, Na, Pr, Cr. Renal U/S. Note CT abd in 2018 demonstrated severe right renal atrophy with no contrast excretion and mildly hypertrophied left kidney. - Will need nephrology evaluation either inpatient or outpatient depending on trajectory.  T2DM:  - SSI, carb-modified diet. At inpatient goal.   Essential hypertension.   - Hold amlodipine, valsartan, HCTZ. Continue metoprolol and IV hydralazine prn.   GERD:  - Continue PPI  History of CVA:  - Continue plavix, statin  DVT prophylaxis: Heparin Code Status: Full Family Communication: None at bedside Disposition Plan: Home once respiratory status improved, still requiring more oxygen than usual and having progressive renal failure.   Consultants:   None  Procedures:   Echocardiogram   Antimicrobials:  Azithromycin 12/11 - 12/15   Subjective: Dyspnea subjectively improved but is requiring 3L when usually only on 2L due to desaturation at rest. Moving around less since being here. Reports very little urine output, urinated a normal volume once today so far, drinking water, denies swelling. Skin is dry.  Objective: Vitals:   09/04/18 2147 09/05/18 0322 09/05/18 0501 09/05/18 0753  BP:   (!) 107/52   Pulse:   71   Resp:   16   Temp:   98.4 F (36.9 C)   TempSrc:   Oral   SpO2: (!) 87% 92% 95% 95%  Weight:   74 kg   Height:  Intake/Output Summary (Last 24 hours) at 09/05/2018 0934 Last data filed at 09/05/2018 0500 Gross per 24 hour  Intake 480 ml  Output -  Net 480 ml   Filed Weights   09/04/18 0500 09/04/18 0636 09/05/18 0501  Weight: 73.5 kg 74.5 kg 74 kg   Gen: 67 y.o. female in no distress Pulm: Nonlabored at rest on 3LPM.  Decreased bilaterally with no wheezing. CV: Regular rate and rhythm. No murmur, rub, or gallop. No JVD, no dependent edema. GI: Abdomen soft, non-tender, non-distended, with normoactive bowel sounds.  Ext: Warm, no deformities Skin: Dry diffusely. No rashes, lesions or ulcers on visualized skin.  Neuro: Alert and oriented. No focal neurological deficits. Psych: Judgement and insight appear fair. Mood euthymic & affect congruent. Behavior is appropriate.    Data Reviewed: I have personally reviewed following labs and imaging studies  CBC: Recent Labs  Lab 09/03/18 1121 09/04/18 0353  WBC 4.0 3.8*  NEUTROABS 2.4  --   HGB 13.5 13.0  HCT 44.4 43.0  MCV 89.2 91.5  PLT 220 397   Basic Metabolic Panel: Recent Labs  Lab 09/03/18 1121 09/04/18 0353 09/05/18 0432  NA 142 141 136  K 4.2 5.0 5.1  CL 107 105 103  CO2 29 26 25   GLUCOSE 127* 178* 161*  BUN 27* 30* 51*  CREATININE 1.72* 2.42* 2.83*  CALCIUM 8.7* 8.4* 7.6*  MG  --  1.9  --    GFR: Estimated Creatinine Clearance: 18.5 mL/min (A) (by C-G formula based on SCr of 2.83 mg/dL (H)). Liver Function Tests: Recent Labs  Lab 09/03/18 1121  AST 8*  ALT 7  ALKPHOS 67  BILITOT 0.6  PROT 6.8  ALBUMIN 3.7   No results for input(s): LIPASE, AMYLASE in the last 168 hours. No results for input(s): AMMONIA in the last 168 hours. Coagulation Profile: No results for input(s): INR, PROTIME in the last 168 hours. Cardiac Enzymes: Recent Labs  Lab 09/03/18 1121 09/03/18 1620 09/03/18 2155 09/04/18 0353  TROPONINI <0.03 <0.03 <0.03 <0.03   BNP (last 3 results) No results for input(s): PROBNP in the last 8760 hours. HbA1C: No results for input(s): HGBA1C in the last 72 hours. CBG: Recent Labs  Lab 09/04/18 0740 09/04/18 1120 09/04/18 1637 09/04/18 2104 09/05/18 0721  GLUCAP 133* 128* 134* 136* 127*   Lipid Profile: No results for input(s): CHOL, HDL, LDLCALC, TRIG, CHOLHDL, LDLDIRECT in the last 72 hours. Thyroid  Function Tests: No results for input(s): TSH, T4TOTAL, FREET4, T3FREE, THYROIDAB in the last 72 hours. Anemia Panel: No results for input(s): VITAMINB12, FOLATE, FERRITIN, TIBC, IRON, RETICCTPCT in the last 72 hours. Urine analysis:    Component Value Date/Time   COLORURINE YELLOW 03/14/2013 1847   APPEARANCEUR CLEAR 03/14/2013 1847   LABSPEC 1.025 03/14/2013 1847   PHURINE 6.0 03/14/2013 1847   GLUCOSEU NEGATIVE 03/14/2013 1847   HGBUR NEGATIVE 03/14/2013 1847   BILIRUBINUR NEGATIVE 03/14/2013 1847   KETONESUR NEGATIVE 03/14/2013 1847   PROTEINUR NEGATIVE 03/14/2013 1847   UROBILINOGEN 0.2 03/14/2013 1847   NITRITE NEGATIVE 03/14/2013 1847   LEUKOCYTESUR NEGATIVE 03/14/2013 1847   No results found for this or any previous visit (from the past 240 hour(s)).    Radiology Studies: Dg Chest 2 View  Result Date: 09/03/2018 CLINICAL DATA:  Chest pain and shortness of breath. EXAM: CHEST - 2 VIEW COMPARISON:  Chest x-rays dated 04/11/2018 and 03/10/2018 and 01/23/2018 and CT scan of the chest dated 12/19/2017 FINDINGS: Since the most recent study, there's  recurrent increased accentuation of the interstitial markings in both lungs as well as increased pulmonary vascular prominence. There's chronic mild cardiomegaly. Chronic blunting of the costophrenic angles. Chronic pleural calcification noted right upper lung zone. The patient has previously demonstrated interstitial lung disease. Aortic atherosclerosis. No acute bone abnormality. IMPRESSION: Findings consistent with congestive heart failure superimposed on chronic interstitial lung disease. Electronically Signed   By: Lorriane Shire M.D.   On: 09/03/2018 12:29    Scheduled Meds: . atorvastatin  80 mg Oral Daily  . azithromycin  250 mg Oral Daily  . buPROPion  150 mg Oral BID  . clopidogrel  75 mg Oral Daily  . famotidine  20 mg Oral Daily  . ferrous sulfate  325 mg Oral BID WC  . heparin  5,000 Units Subcutaneous Q8H  . insulin  aspart  0-5 Units Subcutaneous QHS  . insulin aspart  0-9 Units Subcutaneous TID WC  . ipratropium-albuterol  3 mL Nebulization Q6H  . loratadine  10 mg Oral Daily  . methylPREDNISolone (SOLU-MEDROL) injection  40 mg Intravenous Q12H  . metoprolol tartrate  12.5 mg Oral BID  . mometasone-formoterol  2 puff Inhalation BID  . omega-3 acid ethyl esters  2 g Oral BID  . pantoprazole  40 mg Oral Daily  . sodium chloride flush  3 mL Intravenous Q12H   Continuous Infusions: . sodium chloride       LOS: 2 days   Time spent: 35 minutes.  Patrecia Pour, MD [facility] [email or web address] Password [facility] 09/05/2018, 9:34 AM

## 2018-09-06 DIAGNOSIS — Z23 Encounter for immunization: Secondary | ICD-10-CM | POA: Diagnosis not present

## 2018-09-06 LAB — URINALYSIS, ROUTINE W REFLEX MICROSCOPIC
Bacteria, UA: NONE SEEN
Bilirubin Urine: NEGATIVE
Glucose, UA: NEGATIVE mg/dL
Hgb urine dipstick: NEGATIVE
Ketones, ur: NEGATIVE mg/dL
Leukocytes, UA: NEGATIVE
Nitrite: POSITIVE — AB
Protein, ur: NEGATIVE mg/dL
Specific Gravity, Urine: 1.015 (ref 1.005–1.030)
pH: 5 (ref 5.0–8.0)

## 2018-09-06 LAB — PROTEIN / CREATININE RATIO, URINE
Creatinine, Urine: 121.39 mg/dL
Protein Creatinine Ratio: 0.12 mg/mg{Cre} (ref 0.00–0.15)
TOTAL PROTEIN, URINE: 15 mg/dL

## 2018-09-06 LAB — GLUCOSE, CAPILLARY
Glucose-Capillary: 86 mg/dL (ref 70–99)
Glucose-Capillary: 148 mg/dL — ABNORMAL HIGH (ref 70–99)

## 2018-09-06 LAB — RENAL FUNCTION PANEL
Albumin: 3.6 g/dL (ref 3.5–5.0)
Anion gap: 8 (ref 5–15)
BUN: 60 mg/dL — AB (ref 8–23)
CO2: 30 mmol/L (ref 22–32)
Calcium: 8.1 mg/dL — ABNORMAL LOW (ref 8.9–10.3)
Chloride: 103 mmol/L (ref 98–111)
Creatinine, Ser: 2.4 mg/dL — ABNORMAL HIGH (ref 0.44–1.00)
GFR calc Af Amer: 23 mL/min — ABNORMAL LOW (ref >60)
GFR calc non Af Amer: 20 mL/min — ABNORMAL LOW (ref >60)
Glucose, Bld: 92 mg/dL (ref 70–99)
Phosphorus: 4.9 mg/dL — ABNORMAL HIGH (ref 2.5–4.6)
Potassium: 4.7 mmol/L (ref 3.5–5.1)
Sodium: 141 mmol/L (ref 135–145)

## 2018-09-06 LAB — SODIUM, URINE, RANDOM: Sodium, Ur: 78 mmol/L

## 2018-09-06 MED ORDER — PREDNISONE 20 MG PO TABS: 40.0000 mg | ORAL_TABLET | Freq: Every day | ORAL | 0 refills | Status: AC

## 2018-09-06 MED ORDER — AZITHROMYCIN 250 MG PO TABS: 250.0000 mg | ORAL_TABLET | Freq: Every day | ORAL | 0 refills | Status: DC

## 2018-09-06 MED ORDER — FUROSEMIDE 40 MG PO TABS: 40.0000 mg | ORAL_TABLET | Freq: Every day | ORAL | Status: DC

## 2018-09-06 NOTE — Discharge Summary (Signed)
Physician Discharge Summary  Erica [name]:[id number] DOB: Nov 29, 1950 DOA: 09/03/2018  PCP: Iona Beard, MD  Admit date: 09/03/2018 Discharge date: 09/06/2018  Admitted From: Home Disposition: Home   Recommendations for Outpatient Follow-up:  1. Follow up with PCP in 1-2 weeks 2. Please obtain BMP/CBC in one week 3. Recommend nephrology follow up.  Home Health: None Equipment/Devices: None Discharge Condition: Stable, improved CODE STATUS: Full Diet recommendation: Heart healthy, carb-modified  Brief/Interim Summary: Erica [name] is a 67 y.o. female with a history of 2L-home-O2-dependent COPD, dCHF, T2DM, dyslipidemia, and HTN who presented 12/10 with a week of worsening dyspnea associated with 1 day of substernal chest pain associated with nonproductive cough. She was hypoxic on home O2, requiring 3-4LPM, troponin undetectable and no ischemic ECG findings. CXR demonstrated CHF on background of ILD. BNP 200. She was admitted and started on IV lasix and steroids, duonebs, and repeat echocardiogram ordered. Echo revealed no significant changes with indeterminate diastolic dysfunction but preserved EF and normal IVC caliber. Creatinine increased so lasix stopped and troponins have remained negative with resolution of chest pain. Respiratory status ultimately returned to baseline on 12/13 with return to 2LPM O2 requirement and no dyspnea. Exam improved.   Discharge Diagnoses:  Principal Problem:   Acute hypoxemic respiratory failure (HCC) Active Problems:   COPD exacerbation (HCC)   Benign essential HTN   Type 2 diabetes mellitus with hyperglycemia (HCC)   CKD (chronic kidney disease)   Acute diastolic (congestive) heart failure (HCC)  Acute on chronic hypoxic respiratory failure: Due to COPD exacerbation, possibly also CHF exacerbation though this is not the predominant issue.  - Continue scheduled and prn breathing treatments - Continue symbicort at home - Weaned O2 to  2L. Deescalated to po steroids  Acute on chronic HFpEF: BNP modestly elevated at 200. Repeat echo showed EF 65%, indeterminate diastolic dysfunction, no wall motion abnormalities, and normal caliber IVC.  - Lasix augmented and then held given with creatinine bump. Will restart 40mg  po daily home dose 12/14.  AKI on stage III CKD: Baseline creatinine is unclear as has been increasing lately, in July had AKI with Cr 4.44 when she left AMA from ED.  - Holding ARB-HCTZ - Hold potassium supplement with borderline hyperkalemia and renal impairment.  - Check urinalysis, Na, Pr, Cr. Renal U/S demonstrated atrophic right kidney with increased echogenicity. No abnormalities noted in left kidney and no hydronephrosis. Note CT abd in 2018 demonstrated severe right renal atrophy with no contrast excretion and mildly hypertrophied left kidney. - Will need nephrology evaluation as an outpatient. No inpatient consultation pursued due to spontaneous improvement in creatinine 12/13.   T2DM:  - SSI, carb-modified diet. At inpatient goal. Will restart home glipitin  Essential hypertension.  - Restart home metoprolol and lasix. Holding ARB-HCTZ combination.  GERD:  - Continue PPI  History of CVA:  - Continue plavix, statin  Discharge Instructions Discharge Instructions    Call MD for:  difficulty breathing, headache or visual disturbances   Complete by:  As directed    Diet - low sodium heart healthy   Complete by:  As directed    Discharge instructions   Complete by:  As directed    Take azithromycin and prednisone once daily starting tomorrow for the next 3 days Stop taking diovan-HCT and potassium until you follow up with your primary doctor in the next week. You may restart lasix tomorrow Seek medical attention right away if your symptoms return.   Increase activity slowly  Complete by:  As directed      Allergies as of 09/06/2018   No Known Allergies     Medication List    STOP  taking these medications   potassium chloride SA 20 MEQ tablet Commonly known as:  K-DUR,KLOR-CON   valsartan-hydrochlorothiazide 160-25 MG tablet Commonly known as:  DIOVAN-HCT     TAKE these medications   amLODipine 10 MG tablet Commonly known as:  NORVASC Take 1 tablet by mouth daily.   atorvastatin 80 MG tablet Commonly known as:  LIPITOR Take 80 mg by mouth daily.   azithromycin 250 MG tablet Commonly known as:  ZITHROMAX Take 1 tablet (250 mg total) by mouth daily.   budesonide-formoterol 160-4.5 MCG/ACT inhaler Commonly known as:  SYMBICORT Inhale 2 puffs into the lungs 2 (two) times daily.   buPROPion 150 MG 12 hr tablet Commonly known as:  WELLBUTRIN SR Take 150 mg by mouth 2 (two) times daily.   cetirizine 10 MG tablet Commonly known as:  ZYRTEC Take 1 tablet by mouth daily.   clopidogrel 75 MG tablet Commonly known as:  PLAVIX Take 75 mg by mouth daily.   famotidine 20 MG tablet Commonly known as:  PEPCID Take 20 mg by mouth 2 (two) times daily.   ferrous sulfate 325 (65 FE) MG EC tablet Take 1 tablet (325 mg total) by mouth 2 (two) times daily.   furosemide 40 MG tablet Commonly known as:  LASIX Take 1 tablet (40 mg total) by mouth daily.   ipratropium-albuterol 0.5-2.5 (3) MG/3ML Soln Commonly known as:  DUONEB Take 3 mLs by nebulization every 6 (six) hours as needed (for shortness of breath).   LINZESS 145 MCG Caps capsule Generic drug:  linaclotide Take 145 mcg by mouth daily as needed (for constipation).   metoprolol tartrate 25 MG tablet Commonly known as:  LOPRESSOR Take 0.5 tablets (12.5 mg total) by mouth 2 (two) times daily. What changed:  when to take this   omega-3 acid ethyl esters 1 g capsule Commonly known as:  LOVAZA Take 2 g by mouth 2 (two) times daily.   ONGLYZA 5 MG Tabs tablet Generic drug:  saxagliptin HCl Take 5 mg by mouth daily. Reported on 09/22/2015   pantoprazole 40 MG tablet Commonly known as:   PROTONIX Take 1 tablet (40 mg total) by mouth daily.   predniSONE 20 MG tablet Commonly known as:  DELTASONE Take 2 tablets (40 mg total) by mouth daily with breakfast for 3 days. What changed:    how much to take  how to take this  when to take this  additional instructions   SPIRIVA HANDIHALER 18 MCG inhalation capsule Generic drug:  tiotropium Take 1 puff by mouth daily.   Vitamin D3 50 MCG (2000 UT) Chew      Follow-up Information    Iona Beard, MD. Schedule an appointment as soon as possible for a visit in 1 week(s).   Specialty:  Family Medicine Contact information: [address] [place] [address] [phone]          No Known Allergies  Consultations:  None  Procedures/Studies: Dg Chest 2 View  Result Date: 09/03/2018 CLINICAL DATA:  Chest pain and shortness of breath. EXAM: CHEST - 2 VIEW COMPARISON:  Chest x-rays dated 04/11/2018 and 03/10/2018 and 01/23/2018 and CT scan of the chest dated 12/19/2017 FINDINGS: Since the most recent study, there's recurrent increased accentuation of the interstitial markings in both lungs as well as increased pulmonary  vascular prominence. There's chronic mild cardiomegaly. Chronic blunting of the costophrenic angles. Chronic pleural calcification noted right upper lung zone. The patient has previously demonstrated interstitial lung disease. Aortic atherosclerosis. No acute bone abnormality. IMPRESSION: Findings consistent with congestive heart failure superimposed on chronic interstitial lung disease. Electronically Signed   By: Lorriane Shire M.D.   On: 09/03/2018 12:29   US Renal  Result Date: 09/05/2018 CLINICAL DATA:  Acute renal injury. EXAM: RENAL / URINARY TRACT ULTRASOUND COMPLETE COMPARISON:  CT 08/27/2017. FINDINGS: Right Kidney: Renal measurements: 9.1 x 3.8 x 4.2 cm = volume: 75.6 mL. Increased echogenicity. No mass or hydronephrosis visualized. Left Kidney: Renal measurements: 4 x 5.9 x 5.1 cm =  volume: 179.3 mL. Echogenicity within normal limits. No mass or hydronephrosis visualized. Bladder: Appears normal for degree of bladder distention. IMPRESSION: 1. Right renal increase echogenicity. Right renal atrophy, similar finding noted on prior CT of 08/27/2017. 2. No acute abnormality identified. Left kidney appears unremarkable. No hydronephrosis or bladder distention. Electronically Signed   By: Marcello Moores  Register   On: 09/05/2018 10:52    Subjective: Feels well, breathing back to baseline. Urinating normally, no swelling or chest pain.  Discharge Exam: Vitals:   09/06/18 0809 09/06/18 0851  BP:    Pulse:    Resp:    Temp:    SpO2: 98% 100%   General: Pt is alert, awake, not in acute distress Cardiovascular: RRR, S1/S2 +, no rubs, no gallops Respiratory: Nonlabored and clear bilaterally Abdominal: Soft, NT, ND, bowel sounds + Extremities: No edema, no cyanosis  Labs: BNP (last 3 results) Recent Labs    12/07/17 1218 12/17/17 1738 09/03/18 1620  BNP 155.0* 583.0* 631.4*   Basic Metabolic Panel: Recent Labs  Lab 09/03/18 1121 09/04/18 0353 09/05/18 0432 09/06/18 0744  NA 142 141 136 141  K 4.2 5.0 5.1 4.7  CL 107 105 103 103  CO2 29 26 25 30   GLUCOSE 127* 178* 161* 92  BUN 27* 30* 51* 60*  CREATININE 1.72* 2.42* 2.83* 2.40*  CALCIUM 8.7* 8.4* 7.6* 8.1*  MG  --  1.9  --   --   PHOS  --   --   --  4.9*   Liver Function Tests: Recent Labs  Lab 09/03/18 1121 09/06/18 0744  AST 8*  --   ALT 7  --   ALKPHOS 67  --   BILITOT 0.6  --   PROT 6.8  --   ALBUMIN 3.7 3.6   CBC: Recent Labs  Lab 09/03/18 1121 09/04/18 0353  WBC 4.0 3.8*  NEUTROABS 2.4  --   HGB 13.5 13.0  HCT 44.4 43.0  MCV 89.2 91.5  PLT 220 227   Cardiac Enzymes: Recent Labs  Lab 09/03/18 1121 09/03/18 1620 09/03/18 2155 09/04/18 0353  TROPONINI <0.03 <0.03 <0.03 <0.03   CBG: Recent Labs  Lab 09/05/18 1058 09/05/18 1624 09/05/18 2139 09/06/18 0730 09/06/18 1107  GLUCAP  125* 118* 105* 86 148*    Time coordinating discharge: Approximately 40 minutes  Patrecia Pour, MD  [facility] 09/06/2018, 11:41 AM Pager [phone]

## 2018-09-06 NOTE — Progress Notes (Signed)
IV discontinued,catheter intact. Discharged home with instructions given on medications,and follow up visits,patient verbalized understanding. Prescriptions sent to Pharmacy of choice documented on AVS. Accompanied by staff to an awaiting vehicle.

## 2018-09-06 NOTE — Care Management Important Message (Signed)
Important Message  Patient Details  Name: Erica [name] MRN: [id number] Date of Birth: 1950/11/19   Medicare Important Message Given:  Yes    Shelda Altes 09/06/2018, 1:50 PM

## 2018-09-09 ENCOUNTER — Encounter: Payer: Self-pay | Admitting: Gastroenterology

## 2018-09-09 ENCOUNTER — Ambulatory Visit: Payer: Medicare HMO | Admitting: Gastroenterology

## 2018-09-09 ENCOUNTER — Telehealth: Payer: Self-pay | Admitting: Gastroenterology

## 2018-09-09 NOTE — Telephone Encounter (Signed)
PATIENT WAS A NO SHOW AND LETTER SENT  °

## 2018-09-10 NOTE — Telephone Encounter (Signed)
Routing to Dr. Gala Romney for approval to discharge from the practice

## 2018-09-10 NOTE — Telephone Encounter (Signed)
Three no shows since 02/2018.

## 2018-09-10 NOTE — Telephone Encounter (Signed)
3 no shows

## 2018-09-12 ENCOUNTER — Encounter: Payer: Self-pay | Admitting: General Practice

## 2018-09-12 NOTE — Telephone Encounter (Signed)
discharge

## 2018-09-12 NOTE — Telephone Encounter (Signed)
Discharge letter mailed  

## 2018-09-27 DIAGNOSIS — J449 Chronic obstructive pulmonary disease, unspecified: Secondary | ICD-10-CM | POA: Diagnosis not present

## 2018-10-08 DIAGNOSIS — E119 Type 2 diabetes mellitus without complications: Secondary | ICD-10-CM | POA: Diagnosis not present

## 2018-10-08 DIAGNOSIS — E782 Mixed hyperlipidemia: Secondary | ICD-10-CM | POA: Diagnosis not present

## 2018-10-08 DIAGNOSIS — I10 Essential (primary) hypertension: Secondary | ICD-10-CM | POA: Diagnosis not present

## 2018-10-08 DIAGNOSIS — Z79899 Other long term (current) drug therapy: Secondary | ICD-10-CM | POA: Diagnosis not present

## 2018-10-15 DIAGNOSIS — Z0000 Encounter for general adult medical examination without abnormal findings: Secondary | ICD-10-CM | POA: Diagnosis not present

## 2018-10-15 DIAGNOSIS — J449 Chronic obstructive pulmonary disease, unspecified: Secondary | ICD-10-CM | POA: Diagnosis not present

## 2018-10-15 DIAGNOSIS — E119 Type 2 diabetes mellitus without complications: Secondary | ICD-10-CM | POA: Diagnosis not present

## 2018-10-15 DIAGNOSIS — N183 Chronic kidney disease, stage 3 (moderate): Secondary | ICD-10-CM | POA: Diagnosis not present

## 2018-10-18 DIAGNOSIS — E119 Type 2 diabetes mellitus without complications: Secondary | ICD-10-CM | POA: Diagnosis not present

## 2018-10-18 DIAGNOSIS — H25013 Cortical age-related cataract, bilateral: Secondary | ICD-10-CM | POA: Diagnosis not present

## 2018-10-18 DIAGNOSIS — H524 Presbyopia: Secondary | ICD-10-CM | POA: Diagnosis not present

## 2018-10-28 DIAGNOSIS — J449 Chronic obstructive pulmonary disease, unspecified: Secondary | ICD-10-CM | POA: Diagnosis not present

## 2018-11-05 DIAGNOSIS — D649 Anemia, unspecified: Secondary | ICD-10-CM | POA: Diagnosis not present

## 2018-11-05 DIAGNOSIS — E875 Hyperkalemia: Secondary | ICD-10-CM | POA: Diagnosis not present

## 2018-11-18 IMAGING — DX DG CHEST 2V
2 series · 2 of 2 positions shown · non-contrast
Comparison: March 10, 2018

CLINICAL DATA: Cough and shortness of breath.

EXAM:
CHEST - 2 VIEW

[chest lat]
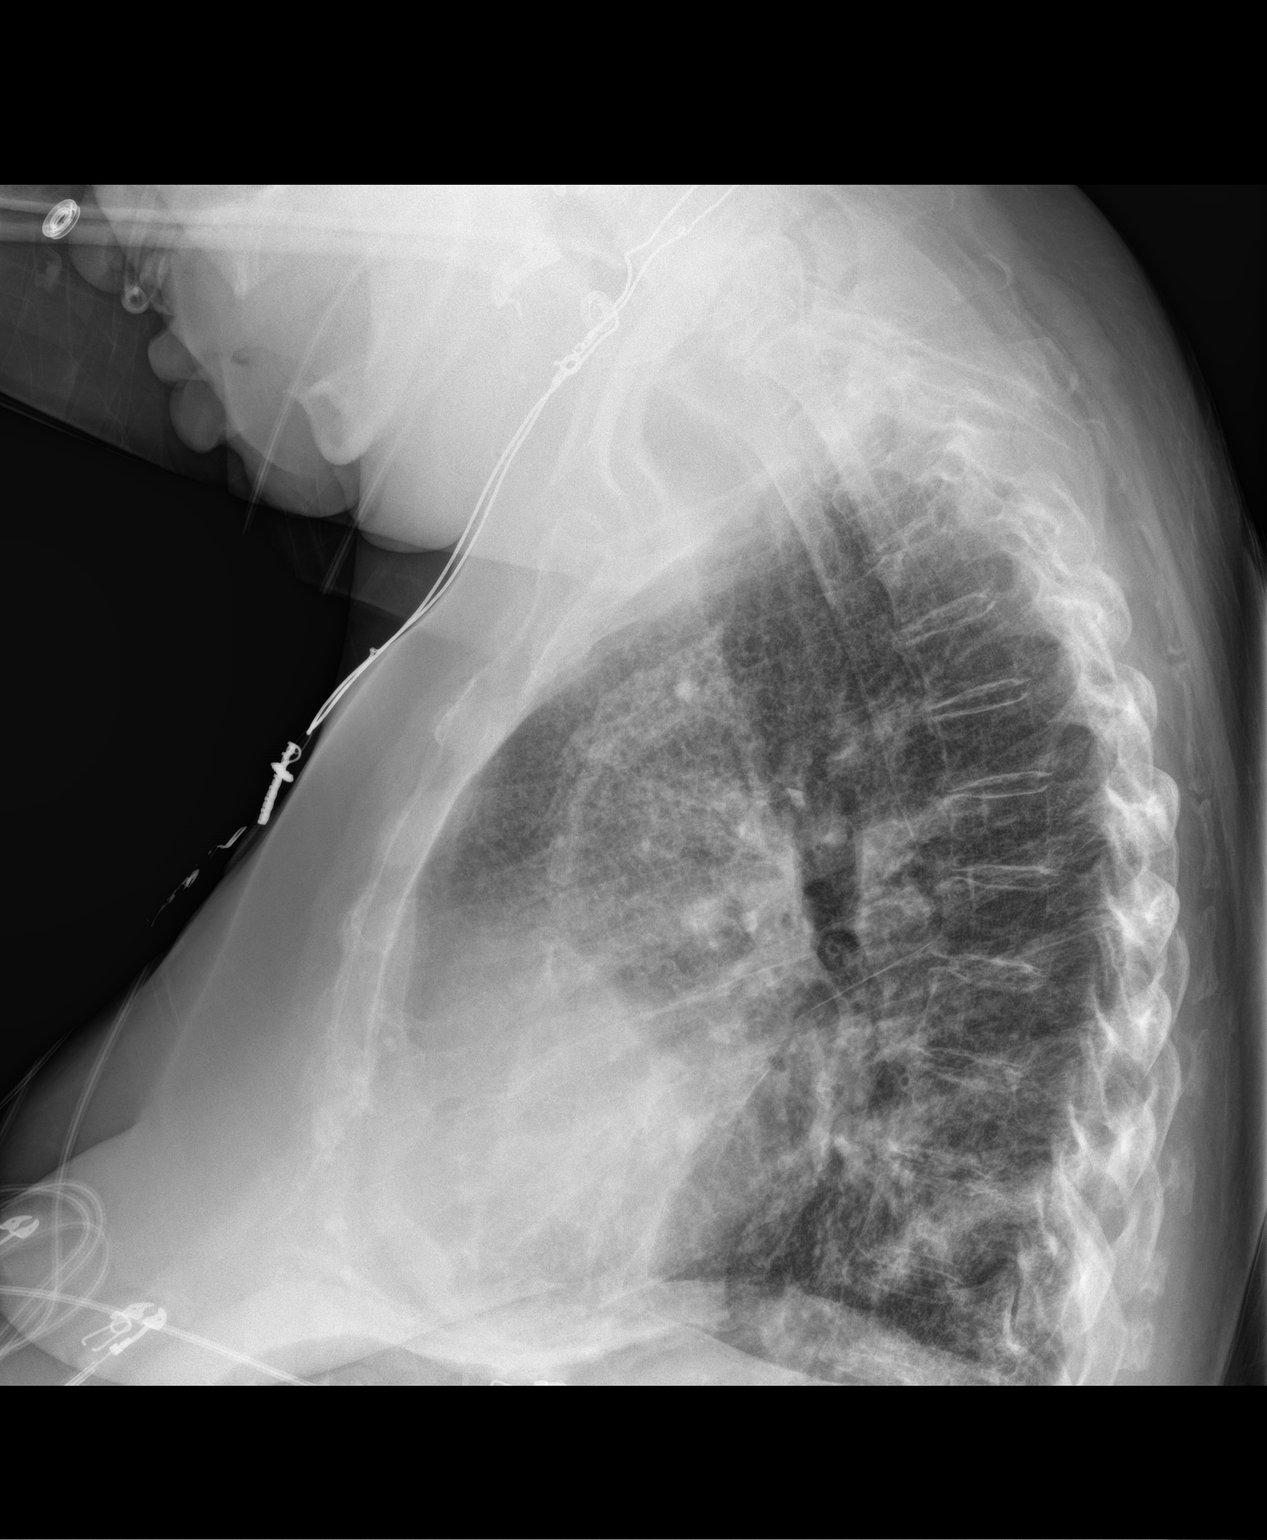

[chest ap]
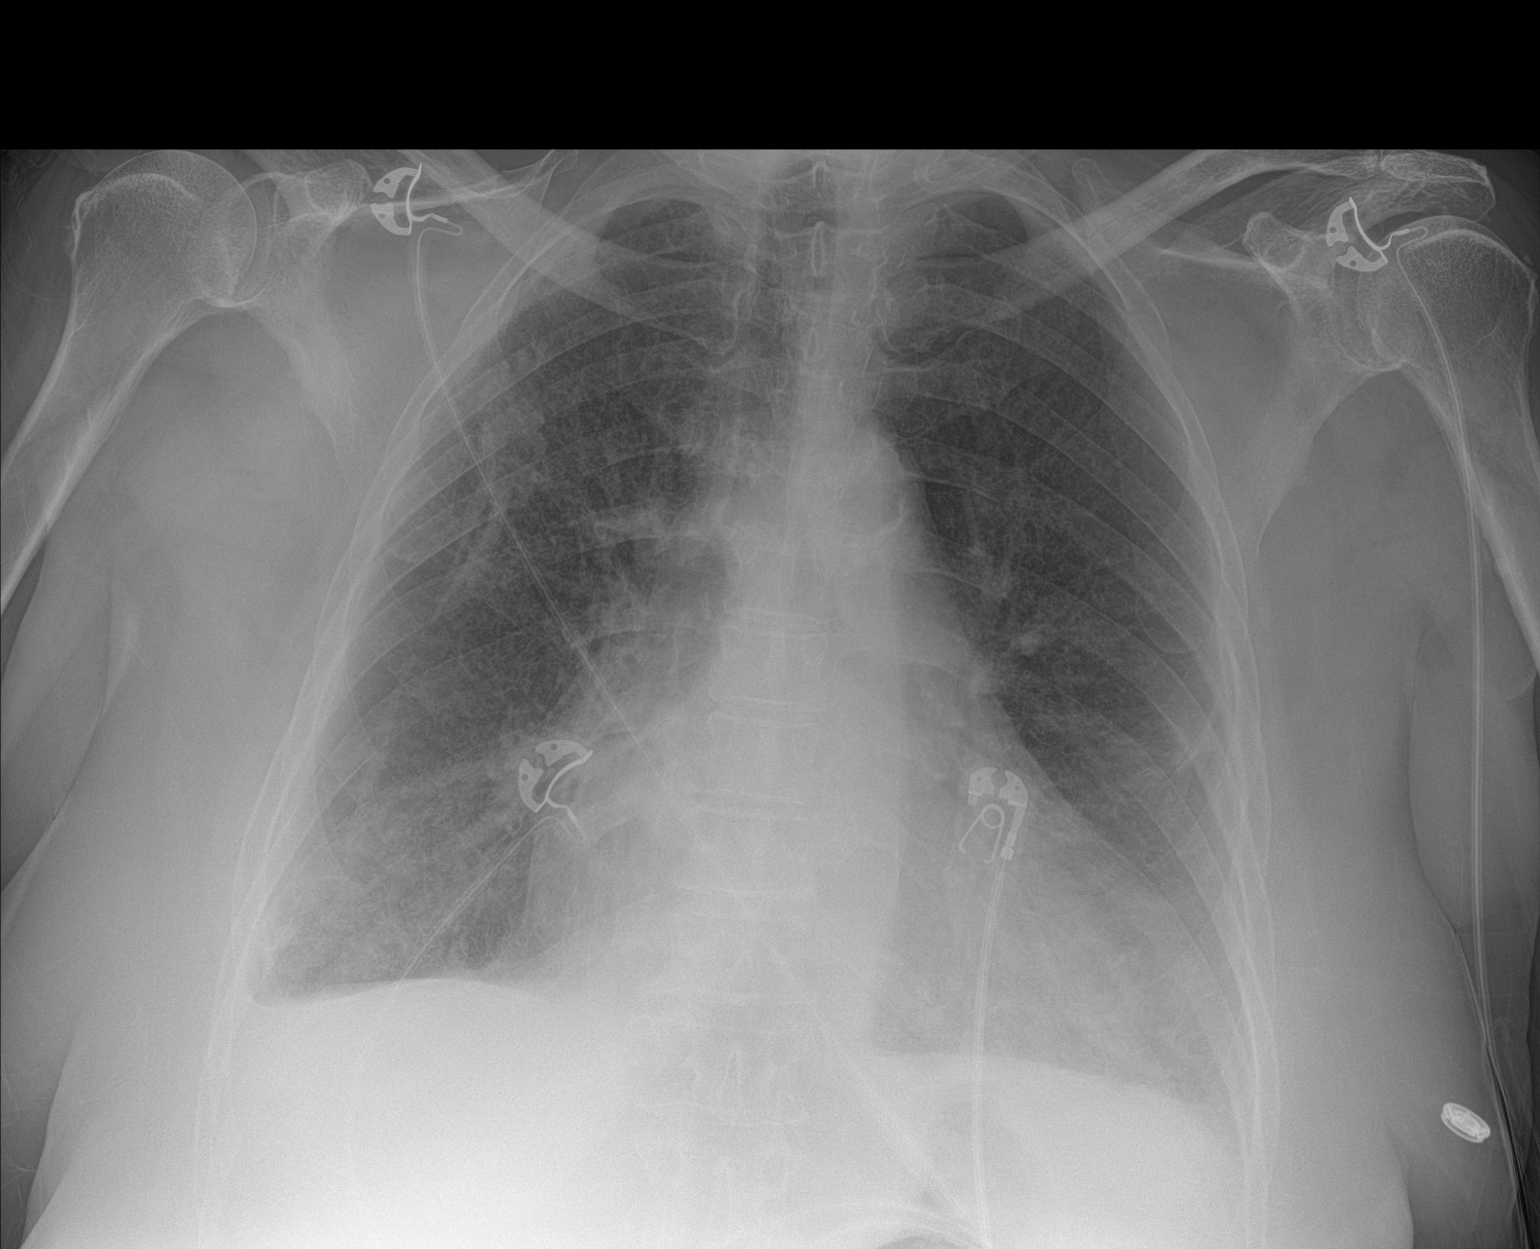

[2 of 2 positions shown; findings below may reference images not displayed]

FINDINGS: The heart size is enlarged. Mediastinal contour is normal. There is
pulmonary edema. Minimal right pleural effusion is noted. Calcified
pleural plaque is noted. Degenerative joint changes of the left
shoulder are noted.
IMPRESSION: Congestive heart failure.

## 2018-11-26 DIAGNOSIS — J449 Chronic obstructive pulmonary disease, unspecified: Secondary | ICD-10-CM | POA: Diagnosis not present

## 2018-11-27 DIAGNOSIS — R0602 Shortness of breath: Secondary | ICD-10-CM | POA: Diagnosis not present

## 2018-11-27 DIAGNOSIS — J441 Chronic obstructive pulmonary disease with (acute) exacerbation: Secondary | ICD-10-CM | POA: Diagnosis not present

## 2018-11-27 DIAGNOSIS — I5023 Acute on chronic systolic (congestive) heart failure: Secondary | ICD-10-CM | POA: Diagnosis not present

## 2018-11-27 DIAGNOSIS — I5033 Acute on chronic diastolic (congestive) heart failure: Secondary | ICD-10-CM | POA: Diagnosis not present

## 2018-11-27 DIAGNOSIS — F17210 Nicotine dependence, cigarettes, uncomplicated: Secondary | ICD-10-CM | POA: Diagnosis not present

## 2018-11-27 DIAGNOSIS — R7989 Other specified abnormal findings of blood chemistry: Secondary | ICD-10-CM | POA: Diagnosis not present

## 2018-11-27 DIAGNOSIS — J9622 Acute and chronic respiratory failure with hypercapnia: Secondary | ICD-10-CM | POA: Diagnosis not present

## 2018-11-27 DIAGNOSIS — I639 Cerebral infarction, unspecified: Secondary | ICD-10-CM | POA: Diagnosis not present

## 2018-11-27 DIAGNOSIS — I5031 Acute diastolic (congestive) heart failure: Secondary | ICD-10-CM | POA: Diagnosis not present

## 2018-11-27 DIAGNOSIS — J9621 Acute and chronic respiratory failure with hypoxia: Secondary | ICD-10-CM | POA: Diagnosis not present

## 2018-11-27 DIAGNOSIS — I119 Hypertensive heart disease without heart failure: Secondary | ICD-10-CM | POA: Diagnosis not present

## 2018-11-27 DIAGNOSIS — E785 Hyperlipidemia, unspecified: Secondary | ICD-10-CM | POA: Diagnosis not present

## 2018-11-27 DIAGNOSIS — I10 Essential (primary) hypertension: Secondary | ICD-10-CM | POA: Diagnosis not present

## 2018-11-27 DIAGNOSIS — I110 Hypertensive heart disease with heart failure: Secondary | ICD-10-CM | POA: Diagnosis not present

## 2018-11-27 DIAGNOSIS — J9601 Acute respiratory failure with hypoxia: Secondary | ICD-10-CM | POA: Diagnosis not present

## 2018-12-06 ENCOUNTER — Ambulatory Visit: Payer: Medicare HMO | Admitting: Cardiology

## 2018-12-25 ENCOUNTER — Observation Stay (HOSPITAL_COMMUNITY)
Admission: EM | Admit: 2018-12-25 | Discharge: 2018-12-26 | Disposition: A | Discharge status: Home / Self Care | Payer: Medicare HMO | Attending: Internal Medicine | Admitting: Internal Medicine

## 2018-12-25 ENCOUNTER — Other Ambulatory Visit: Payer: Self-pay

## 2018-12-25 ENCOUNTER — Emergency Department (HOSPITAL_COMMUNITY): Payer: Medicare HMO

## 2018-12-25 ENCOUNTER — Encounter (HOSPITAL_COMMUNITY): Payer: Self-pay | Admitting: Emergency Medicine

## 2018-12-25 DIAGNOSIS — E1122 Type 2 diabetes mellitus with diabetic chronic kidney disease: Secondary | ICD-10-CM

## 2018-12-25 DIAGNOSIS — J441 Chronic obstructive pulmonary disease with (acute) exacerbation: Secondary | ICD-10-CM | POA: Diagnosis present

## 2018-12-25 DIAGNOSIS — I509 Heart failure, unspecified: Secondary | ICD-10-CM | POA: Diagnosis not present

## 2018-12-25 DIAGNOSIS — R0600 Dyspnea, unspecified: Secondary | ICD-10-CM | POA: Diagnosis not present

## 2018-12-25 DIAGNOSIS — E119 Type 2 diabetes mellitus without complications: Secondary | ICD-10-CM

## 2018-12-25 DIAGNOSIS — I5031 Acute diastolic (congestive) heart failure: Secondary | ICD-10-CM | POA: Diagnosis present

## 2018-12-25 DIAGNOSIS — I5033 Acute on chronic diastolic (congestive) heart failure: Secondary | ICD-10-CM | POA: Diagnosis present

## 2018-12-25 DIAGNOSIS — Z87891 Personal history of nicotine dependence: Secondary | ICD-10-CM | POA: Diagnosis not present

## 2018-12-25 DIAGNOSIS — I130 Hypertensive heart and chronic kidney disease with heart failure and stage 1 through stage 4 chronic kidney disease, or unspecified chronic kidney disease: Secondary | ICD-10-CM | POA: Insufficient documentation

## 2018-12-25 DIAGNOSIS — Z8673 Personal history of transient ischemic attack (TIA), and cerebral infarction without residual deficits: Secondary | ICD-10-CM | POA: Diagnosis not present

## 2018-12-25 DIAGNOSIS — I5032 Chronic diastolic (congestive) heart failure: Secondary | ICD-10-CM | POA: Diagnosis not present

## 2018-12-25 DIAGNOSIS — J9621 Acute and chronic respiratory failure with hypoxia: Principal | ICD-10-CM

## 2018-12-25 DIAGNOSIS — K219 Gastro-esophageal reflux disease without esophagitis: Secondary | ICD-10-CM | POA: Diagnosis present

## 2018-12-25 DIAGNOSIS — N189 Chronic kidney disease, unspecified: Secondary | ICD-10-CM | POA: Diagnosis present

## 2018-12-25 DIAGNOSIS — R0602 Shortness of breath: Secondary | ICD-10-CM

## 2018-12-25 DIAGNOSIS — Z79899 Other long term (current) drug therapy: Secondary | ICD-10-CM | POA: Insufficient documentation

## 2018-12-25 DIAGNOSIS — R0902 Hypoxemia: Secondary | ICD-10-CM | POA: Diagnosis not present

## 2018-12-25 DIAGNOSIS — J9620 Acute and chronic respiratory failure, unspecified whether with hypoxia or hypercapnia: Secondary | ICD-10-CM | POA: Diagnosis present

## 2018-12-25 DIAGNOSIS — J90 Pleural effusion, not elsewhere classified: Secondary | ICD-10-CM | POA: Diagnosis not present

## 2018-12-25 DIAGNOSIS — I10 Essential (primary) hypertension: Secondary | ICD-10-CM | POA: Diagnosis present

## 2018-12-25 DIAGNOSIS — I110 Hypertensive heart disease with heart failure: Secondary | ICD-10-CM | POA: Diagnosis not present

## 2018-12-25 DIAGNOSIS — Z720 Tobacco use: Secondary | ICD-10-CM | POA: Diagnosis present

## 2018-12-25 LAB — CBC WITH DIFFERENTIAL/PLATELET
Abs Immature Granulocytes: 0.02 10*3/uL (ref 0.00–0.07)
Basophils Absolute: 0 10*3/uL (ref 0.0–0.1)
Basophils Relative: 0 %
Eosinophils Absolute: 0.6 10*3/uL — ABNORMAL HIGH (ref 0.0–0.5)
Eosinophils Relative: 8 %
HCT: 40.1 % (ref 36.0–46.0)
Hemoglobin: 11.9 g/dL — ABNORMAL LOW (ref 12.0–15.0)
Immature Granulocytes: 0 %
Lymphocytes Relative: 18 %
Lymphs Abs: 1.3 10*3/uL (ref 0.7–4.0)
MCH: 26.1 pg (ref 26.0–34.0)
MCHC: 29.7 g/dL — ABNORMAL LOW (ref 30.0–36.0)
MCV: 87.9 fL (ref 80.0–100.0)
Monocytes Absolute: 0.5 10*3/uL (ref 0.1–1.0)
Monocytes Relative: 7 %
Neutro Abs: 4.7 10*3/uL (ref 1.7–7.7)
Neutrophils Relative %: 67 %
Platelets: 297 10*3/uL (ref 150–400)
RBC: 4.56 MIL/uL (ref 3.87–5.11)
RDW: 15.9 % — ABNORMAL HIGH (ref 11.5–15.5)
WBC: 7.1 10*3/uL (ref 4.0–10.5)
nRBC: 0 % (ref 0.0–0.2)

## 2018-12-25 LAB — BASIC METABOLIC PANEL
Anion gap: 10 (ref 5–15)
BUN: 37 mg/dL — ABNORMAL HIGH (ref 8–23)
CO2: 32 mmol/L (ref 22–32)
Calcium: 8.7 mg/dL — ABNORMAL LOW (ref 8.9–10.3)
Chloride: 102 mmol/L (ref 98–111)
Creatinine, Ser: 2.47 mg/dL — ABNORMAL HIGH (ref 0.44–1.00)
GFR calc Af Amer: 22 mL/min — ABNORMAL LOW (ref >60)
GFR calc non Af Amer: 19 mL/min — ABNORMAL LOW (ref >60)
Glucose, Bld: 99 mg/dL (ref 70–99)
Potassium: 4.3 mmol/L (ref 3.5–5.1)
Sodium: 144 mmol/L (ref 135–145)

## 2018-12-25 LAB — PROTIME-INR
INR: 1 (ref 0.8–1.2)
Prothrombin Time: 13.1 seconds (ref 11.4–15.2)

## 2018-12-25 LAB — BRAIN NATRIURETIC PEPTIDE: B Natriuretic Peptide: 984 pg/mL — ABNORMAL HIGH (ref 0.0–100.0)

## 2018-12-25 LAB — TROPONIN I: Troponin I: <0.03 ng/mL (ref <0.03)

## 2018-12-25 MED ORDER — INSULIN ASPART 100 UNIT/ML ~~LOC~~ SOLN
0.0000 [IU] | Freq: Three times a day (TID) | SUBCUTANEOUS | Status: DC
  Administered 2018-12-26 (×2): 2 [IU] via SUBCUTANEOUS

## 2018-12-25 MED ORDER — AMLODIPINE BESYLATE 5 MG PO TABS
10.0000 mg | ORAL_TABLET | Freq: Every day | ORAL | Status: DC
  Filled 2018-12-25: qty 2

## 2018-12-25 MED ORDER — TIOTROPIUM BROMIDE MONOHYDRATE 18 MCG IN CAPS: 18.0000 ug | ORAL_CAPSULE | Freq: Every day | RESPIRATORY_TRACT | Status: DC

## 2018-12-25 MED ORDER — LINAGLIPTIN 5 MG PO TABS
5.0000 mg | ORAL_TABLET | Freq: Every day | ORAL | Status: DC
  Administered 2018-12-26: 09:00:00 5 mg via ORAL
  Filled 2018-12-25: qty 1

## 2018-12-25 MED ORDER — IPRATROPIUM BROMIDE HFA 17 MCG/ACT IN AERS: 2.0000 | INHALATION_SPRAY | Freq: Four times a day (QID) | RESPIRATORY_TRACT | Status: DC

## 2018-12-25 MED ORDER — ENSURE ENLIVE PO LIQD
237.0000 mL | Freq: Two times a day (BID) | ORAL | Status: DC
  Administered 2018-12-26: 237 mL via ORAL

## 2018-12-25 MED ORDER — HEPARIN SODIUM (PORCINE) 5000 UNIT/ML IJ SOLN
5000.0000 [IU] | Freq: Three times a day (TID) | INTRAMUSCULAR | Status: DC
  Administered 2018-12-26 (×2): 5000 [IU] via SUBCUTANEOUS
  Filled 2018-12-25 (×2): qty 1

## 2018-12-25 MED ORDER — METHYLPREDNISOLONE SODIUM SUCC 125 MG IJ SOLR
125.0000 mg | Freq: Once | INTRAMUSCULAR | Status: AC
  Administered 2018-12-25: 19:00:00 125 mg via INTRAVENOUS
  Filled 2018-12-25: qty 2

## 2018-12-25 MED ORDER — ORAL CARE MOUTH RINSE
15.0000 mL | Freq: Two times a day (BID) | OROMUCOSAL | Status: DC
  Administered 2018-12-26 (×2): 15 mL via OROMUCOSAL

## 2018-12-25 MED ORDER — ALBUTEROL SULFATE HFA 108 (90 BASE) MCG/ACT IN AERS
INHALATION_SPRAY | RESPIRATORY_TRACT | Status: AC
  Administered 2018-12-25: 20:00:00 8 via RESPIRATORY_TRACT
  Filled 2018-12-25: qty 6.7

## 2018-12-25 MED ORDER — IPRATROPIUM-ALBUTEROL 0.5-2.5 (3) MG/3ML IN SOLN
3.0000 mL | Freq: Four times a day (QID) | RESPIRATORY_TRACT | Status: DC
  Administered 2018-12-26 (×2): 3 mL via RESPIRATORY_TRACT
  Filled 2018-12-25 (×2): qty 3

## 2018-12-25 MED ORDER — MOMETASONE FURO-FORMOTEROL FUM 200-5 MCG/ACT IN AERO
2.0000 | INHALATION_SPRAY | Freq: Two times a day (BID) | RESPIRATORY_TRACT | Status: DC
  Filled 2018-12-25: qty 8.8

## 2018-12-25 MED ORDER — IPRATROPIUM BROMIDE 0.02 % IN SOLN: 1.0000 mg | Freq: Once | RESPIRATORY_TRACT | Status: DC

## 2018-12-25 MED ORDER — FUROSEMIDE 10 MG/ML IJ SOLN
40.0000 mg | Freq: Two times a day (BID) | INTRAMUSCULAR | Status: DC
  Administered 2018-12-26: 08:00:00 40 mg via INTRAVENOUS
  Filled 2018-12-25: qty 4

## 2018-12-25 MED ORDER — METOPROLOL TARTRATE 25 MG PO TABS
12.5000 mg | ORAL_TABLET | Freq: Two times a day (BID) | ORAL | Status: DC
  Administered 2018-12-26 (×2): 12.5 mg via ORAL
  Filled 2018-12-25 (×2): qty 1

## 2018-12-25 MED ORDER — LINACLOTIDE 145 MCG PO CAPS: 145.0000 ug | ORAL_CAPSULE | Freq: Every day | ORAL | Status: DC | PRN

## 2018-12-25 MED ORDER — FUROSEMIDE 10 MG/ML IJ SOLN
40.0000 mg | Freq: Once | INTRAMUSCULAR | Status: AC
  Administered 2018-12-25: 19:00:00 40 mg via INTRAVENOUS
  Filled 2018-12-25: qty 4

## 2018-12-25 MED ORDER — FAMOTIDINE 20 MG PO TABS: 20.0000 mg | ORAL_TABLET | Freq: Two times a day (BID) | ORAL | Status: DC

## 2018-12-25 MED ORDER — NICOTINE 14 MG/24HR TD PT24
14.0000 mg | MEDICATED_PATCH | Freq: Every day | TRANSDERMAL | Status: DC
  Administered 2018-12-26 (×2): 14 mg via TRANSDERMAL
  Filled 2018-12-25 (×2): qty 1

## 2018-12-25 MED ORDER — ALBUTEROL SULFATE HFA 108 (90 BASE) MCG/ACT IN AERS
8.0000 | INHALATION_SPRAY | Freq: Once | RESPIRATORY_TRACT | Status: AC
  Administered 2018-12-25: 20:00:00 8 via RESPIRATORY_TRACT

## 2018-12-25 MED ORDER — CLOPIDOGREL BISULFATE 75 MG PO TABS
75.0000 mg | ORAL_TABLET | Freq: Every day | ORAL | Status: DC
  Administered 2018-12-26: 75 mg via ORAL
  Filled 2018-12-25: qty 1

## 2018-12-25 MED ORDER — OMEGA-3-ACID ETHYL ESTERS 1 G PO CAPS
2.0000 g | ORAL_CAPSULE | Freq: Two times a day (BID) | ORAL | Status: DC
  Administered 2018-12-26 (×2): 2 g via ORAL
  Filled 2018-12-25 (×2): qty 2

## 2018-12-25 MED ORDER — PANTOPRAZOLE SODIUM 40 MG PO TBEC
40.0000 mg | DELAYED_RELEASE_TABLET | Freq: Every day | ORAL | Status: DC
  Administered 2018-12-26: 40 mg via ORAL
  Filled 2018-12-25: qty 1

## 2018-12-25 MED ORDER — ALBUTEROL (5 MG/ML) CONTINUOUS INHALATION SOLN
10.0000 mg/h | INHALATION_SOLUTION | Freq: Once | RESPIRATORY_TRACT | Status: DC
  Filled 2018-12-25: qty 20

## 2018-12-25 MED ORDER — FERROUS SULFATE 325 (65 FE) MG PO TABS
325.0000 mg | ORAL_TABLET | Freq: Two times a day (BID) | ORAL | Status: DC
  Administered 2018-12-26 (×2): 325 mg via ORAL
  Filled 2018-12-25 (×2): qty 1

## 2018-12-25 MED ORDER — ALBUTEROL SULFATE HFA 108 (90 BASE) MCG/ACT IN AERS: 2.0000 | INHALATION_SPRAY | Freq: Four times a day (QID) | RESPIRATORY_TRACT | Status: DC

## 2018-12-25 MED ORDER — METHYLPREDNISOLONE SODIUM SUCC 40 MG IJ SOLR
40.0000 mg | Freq: Three times a day (TID) | INTRAMUSCULAR | Status: DC
  Administered 2018-12-26: 40 mg via INTRAVENOUS
  Filled 2018-12-25: qty 1

## 2018-12-25 MED ORDER — BUPROPION HCL ER (SR) 150 MG PO TB12
150.0000 mg | ORAL_TABLET | Freq: Two times a day (BID) | ORAL | Status: DC
  Administered 2018-12-26 (×2): 150 mg via ORAL
  Filled 2018-12-25 (×2): qty 1

## 2018-12-25 MED ORDER — ATORVASTATIN CALCIUM 40 MG PO TABS
80.0000 mg | ORAL_TABLET | Freq: Every day | ORAL | Status: DC
  Administered 2018-12-26: 80 mg via ORAL
  Filled 2018-12-25: qty 2

## 2018-12-25 NOTE — ED Triage Notes (Signed)
Pt states that she can not breath for the past 3 days.

## 2018-12-25 NOTE — H&P (Signed)
TR[facility] H&P   Patient Demographics:    Erica [name], is a 68 y.o. female  MRN: [id number]   DOB - 08/27/1951  Admit Date - 12/25/2018  Outpatient Primary MD for the patient is Iona Beard, MD  Referring MD/NP/PA: Dr Thurnell Garbe  Patient coming from: Home  Chief Complaint  Patient presents with  . Shortness of Breath      HPI:    Erica [name]  is a 68 y.o. female, with past medical history significant for COPD, chronic respiratory failure on 2 L oxygen, diastolic CHF, type 2 diabetes mellitus, dyslipidemia, hypertension, patient presents to ED secondary to complaints of shortness of breath, reports worsening over last 2 days, initially exertional, as well reports orthopnea currently, she reports mild pitting edema as well, denies fever, chills, recent travel, dry cough, any sick contacts, reports wheezing, with home inhalers did not help much which prompted her to come to ED. - in ED patient was hypoxic on 2 L nasal cannula at 84%, improved on 4 L nasal cannula, chest x-ray significant for edema and mild effusion, she had significant wheezing improved with inhaler treatment and steroids, no leukocytosis, creatinine elevated at 2.4, which is her baseline, he received 40 mg of IV Lasix as he had elevated proBNP.    Review of systems:    In addition to the HPI above,  No Fever-chills, No Headache, No changes with Vision or hearing, No problems swallowing food or Liquids, No Chest pain, ports cough, mildly productive, reports dyspnea No Abdominal pain, No Nausea or Vommitting, Bowel movements are regular, No Blood in stool or Urine, No dysuria, No new skin rashes or bruises, No new joints pains-aches,  No new weakness, tingling, numbness in any extremity, No recent weight gain or loss, No polyuria, polydypsia or polyphagia, No significant Mental Stressors.  A full 10 point Review  of Systems was done, except as stated above, all other Review of Systems were negative.   With Past History of the following :    Past Medical History:  Diagnosis Date  . Arthritis   . CHF (congestive heart failure) ([place])   . Chronic back pain   . COPD (chronic obstructive pulmonary disease) (HCC)    2L home O2  . Diabetes mellitus without complication ([place])   . Hyperlipidemia   . Hypertension   . Stroke (cerebrum) [facility])       Past Surgical History:  Procedure Laterality Date  . ABDOMINAL HYSTERECTOMY    . colonoscopy  2009   Dr. Gala Romney: cluster of diminutive rectal polyps and 2 diminutive polyps in rectosigmoid junction, hyperplastic  . COLONOSCOPY WITH PROPOFOL N/A 10/18/2017   Procedure: COLONOSCOPY WITH PROPOFOL;  Surgeon: Daneil Dolin, MD;  Location: [facility];  Service: Endoscopy;  Laterality: N/A;  9:30am  . ESOPHAGOGASTRODUODENOSCOPY N/A 01/25/2018   Procedure: ESOPHAGOGASTRODUODENOSCOPY (EGD);  Surgeon: Danie Binder, MD;  Location: [facility]  ENDO SUITE;  Service: Endoscopy;  Laterality: N/A;  . ESOPHAGOGASTRODUODENOSCOPY (EGD) WITH PROPOFOL N/A 08/29/2017   Dr. Gala Romney with propofol: small hiatal hernia. 4cm fundal diverticulum.   Marland Kitchen GIVENS CAPSULE STUDY N/A 01/25/2018   Procedure: GIVENS CAPSULE STUDY;  Surgeon: Danie Binder, MD;  Location: [facility];  Service: Endoscopy;  Laterality: N/A;  . left hand middle finger     in an accident at work, amputated      Social History:     Social History   Tobacco Use  . Smoking status: Former Smoker    Packs/day: 1.00    Years: 52.00    Pack years: 52.00    Types: Cigarettes    Last attempt to quit: 09/23/2017    Years since quitting: 1.2  . Smokeless tobacco: Never Used  Substance Use Topics  . Alcohol use: No        Family History :     Family History  Problem Relation Age of Onset  . CVA Mother 16  . Cancer Neg Hx   . Colon cancer Neg Hx       Home Medications:   Prior to Admission medications    Medication Sig Start Date End Date Taking? Authorizing Provider  amLODipine (NORVASC) 10 MG tablet Take 1 tablet by mouth daily. 12/27/17   [provider]  atorvastatin (LIPITOR) 80 MG tablet Take 80 mg by mouth daily.  12/04/16   [provider]  azithromycin (ZITHROMAX) 250 MG tablet Take 1 tablet (250 mg total) by mouth daily. 09/06/18   Patrecia Pour, MD  budesonide-formoterol (SYMBICORT) 160-4.5 MCG/ACT inhaler Inhale 2 puffs into the lungs 2 (two) times daily. 11/23/15   Barton Dubois, MD  buPROPion [facility] SR) 150 MG 12 hr tablet Take 150 mg by mouth 2 (two) times daily. 12/04/16   [provider]  cetirizine (ZYRTEC) 10 MG tablet Take 1 tablet by mouth daily. 01/28/18   [provider]  Cholecalciferol (VITAMIN D3) 50 MCG (2000 UT) CHEW  07/08/18   [provider]  clopidogrel (PLAVIX) 75 MG tablet Take 75 mg by mouth daily.  03/29/18   [provider]  famotidine (PEPCID) 20 MG tablet Take 20 mg by mouth 2 (two) times daily.  03/29/18   [provider]  ferrous sulfate 325 (65 FE) MG EC tablet Take 1 tablet (325 mg total) by mouth 2 (two) times daily. 01/25/18 09/03/18  Isaac Bliss, Rayford Halsted, MD  furosemide (LASIX) 40 MG tablet Take 1 tablet (40 mg total) by mouth daily. 09/06/18   Patrecia Pour, MD  ipratropium-albuterol (DUONEB) 0.5-2.5 (3) MG/3ML SOLN Take 3 mLs by nebulization every 6 (six) hours as needed (for shortness of breath).    [provider]  Linaclotide Rolan Lipa) 145 MCG CAPS capsule Take 145 mcg by mouth daily as needed (for constipation).     [provider]  metoprolol tartrate (LOPRESSOR) 25 MG tablet Take 0.5 tablets (12.5 mg total) by mouth 2 (two) times daily. Patient taking differently: Take 12.5 mg by mouth daily.  08/30/17   Orson Eva, MD  omega-3 acid ethyl esters (LOVAZA) 1 g capsule Take 2 g by mouth 2 (two) times daily.  12/07/16   [provider]  pantoprazole (PROTONIX) 40  MG tablet Take 1 tablet (40 mg total) by mouth daily. 01/25/18   Isaac Bliss, Rayford Halsted, MD  saxagliptin HCl (ONGLYZA) 5 MG TABS tablet Take 5 mg by mouth daily. Reported on 09/22/2015  [provider]  SPIRIVA HANDIHALER 18 MCG inhalation capsule Take 1 puff by mouth daily. 10/31/17   [provider]     Allergies:    No Known Allergies   Physical Exam:   Vitals  Blood pressure (!) 107/53, pulse 76, temperature 98.1 F (36.7 C), temperature source Temporal, resp. rate (!) 24, height 5\' 4"  (1.626 m), weight 78.9 kg, SpO2 91 %.   1. General elderly female, laying in bed in mild distress secondary to dyspnea  2. Normal affect and insight, Not Suicidal or Homicidal, Awake Alert, Oriented X 3.  3. No F.N deficits, ALL C.Nerves Intact, Strength 5/5 all 4 extremities, Sensation intact all 4 extremities, Plantars down going.  4. Ears and Eyes appear Normal, Conjunctivae clear, PERRLA. Moist Oral Mucosa.  5. Supple Neck, No JVD, No cervical lymphadenopathy appriciated, No Carotid Bruits.  6. Symmetrical Chest wall movement, he has scattered wheezing bilaterally, with bibasilar crackles   7. RRR, No Gallops, Rubs or Murmurs, No Parasternal Heave.  8. Positive Bowel Sounds, Abdomen Soft, No tenderness, No organomegaly appriciated,No rebound -guarding or rigidity.  9.  No Cyanosis, Normal Skin Turgor, No Skin Rash or Bruise.  Has trace pedal edema  10. Good muscle tone,  joints appear normal , no effusions, Normal ROM.  11. No Palpable Lymph Nodes in Neck or Axillae     Data Review:    CBC Recent Labs  Lab 12/25/18 1852  WBC 7.1  HGB 11.9*  HCT 40.1  PLT 297  MCV 87.9  MCH 26.1  MCHC 29.7*  RDW 15.9*  LYMPHSABS 1.3  MONOABS 0.5  EOSABS 0.6*  BASOSABS 0.0   ------------------------------------------------------------------------------------------------------------------  Chemistries  Recent Labs  Lab 12/25/18 1852  NA 144  K 4.3  CL 102   CO2 32  GLUCOSE 99  BUN 37*  CREATININE 2.47*  CALCIUM 8.7*   ------------------------------------------------------------------------------------------------------------------ estimated creatinine clearance is 21.9 mL/min (A) (by C-G formula based on SCr of 2.47 mg/dL (H)). ------------------------------------------------------------------------------------------------------------------ No results for input(s): TSH, T4TOTAL, T3FREE, THYROIDAB in the last 72 hours.  Invalid input(s): FREET3  Coagulation profile Recent Labs  Lab 12/25/18 1852  INR 1.0   ------------------------------------------------------------------------------------------------------------------- No results for input(s): DDIMER in the last 72 hours. -------------------------------------------------------------------------------------------------------------------  Cardiac Enzymes Recent Labs  Lab 12/25/18 1852  TROPONINI <0.03   ------------------------------------------------------------------------------------------------------------------    Component Value Date/Time   BNP 984.0 (H) 12/25/2018 1852     ---------------------------------------------------------------------------------------------------------------  Urinalysis    Component Value Date/Time   COLORURINE YELLOW 09/05/2018 1210   APPEARANCEUR CLEAR 09/05/2018 1210   LABSPEC 1.015 09/05/2018 1210   PHURINE 5.0 09/05/2018 1210   GLUCOSEU NEGATIVE 09/05/2018 1210   HGBUR NEGATIVE 09/05/2018 1210   BILIRUBINUR NEGATIVE 09/05/2018 1210   KETONESUR NEGATIVE 09/05/2018 1210   PROTEINUR NEGATIVE 09/05/2018 1210   UROBILINOGEN 0.2 03/14/2013 1847   NITRITE POSITIVE (A) 09/05/2018 1210   LEUKOCYTESUR NEGATIVE 09/05/2018 1210    ----------------------------------------------------------------------------------------------------------------   Imaging Results:    Dg Chest Port 1 View  Result Date: 12/25/2018 CLINICAL DATA:  Shortness of  breath. EXAM: PORTABLE CHEST 1 VIEW COMPARISON:  Radiographs of September 03, 2018. FINDINGS: Stable cardiomegaly. Atherosclerosis of thoracic aorta is noted. Mild bilateral diffuse interstitial densities are noted concerning for pulmonary edema. No pneumothorax is noted. Minimal bilateral pleural effusions may be present. Bony thorax is unremarkable. IMPRESSION: Stable cardiomegaly is noted with probable mild bilateral pulmonary edema. Minimal pleural effusions are noted. Electronically Signed   By: Marijo Conception, M.D.  On: 12/25/2018 19:23    My personal review of EKG: Rhythm NSR, Rate  69 /min, QTc 448   Assessment & Plan:    Active Problems:   COPD exacerbation (HCC)   Diabetes mellitus type 2, controlled ([place])   Benign essential HTN   GERD (gastroesophageal reflux disease)   Tobacco abuse   Acute on chronic respiratory failure (HCC)   Acute on chronic diastolic CHF (congestive heart failure) (HCC)   Acute dyspnea   CKD (chronic kidney disease)   Acute diastolic (congestive) heart failure (HCC)  Acute on chronic hypoxic respiratory failure -Plan on 2 L nasal cannula, hypoxic 84% on 3 L nasal cannula on presentation, currently she is on 4 L nasal cannula with good saturation, this is in the setting of COPD Sister Lynetta Mare and CHF exacerbation.  Acute COPD exacerbation -Reports dyspnea, has significant wheezing on presentation, some productive cough, improved with IV steroids, and albuterol/ipratropium inhalers, will continue with IV Solu-Medrol 60 every 8 hours, albuterol, ipratropium, home medication including Spiriva and Breo Ellipta.  Acute on chronic diastolic CHF  -Last echo summer 2019 with EF 65%, with evidence of diastolic dysfunction, indeterminate grade, no wall motion abnormalities, she is with elevated proBNP, and volume overload on imaging, continue with IV Lasix 40 mg twice daily, continue to monitor daily labs, daily weights, strict ins and outs  CKd stage III -Ferritin  at baseline, continue to monitor closely in the setting of diuresis, avoid nephrotoxic medication  Type 2 diabetes mellitus -Started insulin sliding scale as she will be on steroids, continue with home meds  Hypertension -Continue with home medications  GERD -Continue with PPI and ranitidine  History of CVA -Continue with Plavix and statin  Tobacco abuse -Counseled, will start on nicotine patch  DVT Prophylaxis Heparin - SCDs   AM Labs Ordered, also please review Full Orders  Family Communication: Admission, patients condition and plan of care including tests being ordered have been discussed with the patient  who indicate understanding and agree with the plan and Code Status.  Code Status Full  Likely DC to  Home  Condition GUARDED    Consults called: None  Admission status: Inpatient  Time spent in minutes : 60 minutes   Phillips Climes M.D on 12/25/2018 at 9:42 PM  Between 7am to 7pm - Pager - [phone]. After 7pm go to [email or web address] - password [facility]  [facility] - Office  [phone]

## 2018-12-25 NOTE — ED Provider Notes (Signed)
[facility] EMERGENCY DEPARTMENT Provider Note   CSN: [id number] Arrival date & time: 12/25/18  1819    History   Chief Complaint Chief Complaint  Patient presents with   Shortness of Breath    HPI Erica [name] is a 68 y.o. female.     HPI  Pt was seen at [facility].  Per pt, c/o gradual onset and worsening of persistent SOB for the past 3 days. States she feels her feet are "more swollen" over the past several days. States she has been turning up her home O2 without improvement of her symptoms. Has been using her MDI without relief. States she has been staying in her house with family only for the past 1 week. Denies COVID exposure. Denies fevers.  Denies CP/palpitations, no back pain, no abd pain, no N/V/D, no rash.     Past Medical History:  Diagnosis Date   Arthritis    CHF (congestive heart failure) (HCC)    Chronic back pain    COPD (chronic obstructive pulmonary disease) (HCC)    2L home O2   Diabetes mellitus without complication ([place])    Hyperlipidemia    Hypertension    Stroke (cerebrum) [facility])     Patient Active Problem List   Diagnosis Date Noted   Acute hypoxemic respiratory failure ([place]) 40/04/6760   Acute diastolic (congestive) heart failure ([place]) 03/09/2018   AVM (arteriovenous malformation) of duodenum, acquired    GIB (gastrointestinal bleeding) 01/23/2018   CKD (chronic kidney disease) 01/23/2018   Hyperkalemia 12/21/2017   Hyperlipidemia 12/18/2017   Hypomagnesemia 12/17/2017   Acute renal failure superimposed on stage 3 chronic kidney disease ([place]) 12/07/2017   Acute on chronic respiratory failure with hypoxia ([place]) 12/07/2017   COPD with acute exacerbation ([place]) 95/05/3266   Acute diastolic CHF (congestive heart failure) ([place]) 09/19/2017   Iron deficiency anemia due to chronic blood loss    Acute diverticulitis 08/29/2017   Heme positive stool    Symptomatic anemia    GI bleed 08/27/2017   Elevated troponin  08/27/2017   (HFpEF) heart failure with preserved ejection fraction ([place]) 08/27/2017   Acute dyspnea    Acute on chronic respiratory failure ([place]) 02/11/2017   Acute on chronic diastolic CHF (congestive heart failure) ([place]) 02/11/2017   Benign essential HTN 11/20/2015   GERD (gastroesophageal reflux disease) 11/20/2015   Tobacco abuse 11/20/2015   Type 2 diabetes mellitus with hyperglycemia ([place]) 11/20/2015   COPD exacerbation ([place]) 09/05/2015   Chronic pain syndrome 09/05/2015   Hypertension 09/05/2015   Diabetes mellitus type 2, controlled ([place]) 09/05/2015   Chronic respiratory failure ([place]) 09/05/2015   OLECRANON BURSITIS, LEFT 08/04/2010    Past Surgical History:  Procedure Laterality Date   ABDOMINAL HYSTERECTOMY     colonoscopy  2009   Dr. Gala Romney: cluster of diminutive rectal polyps and 2 diminutive polyps in rectosigmoid junction, hyperplastic   COLONOSCOPY WITH PROPOFOL N/A 10/18/2017   Procedure: COLONOSCOPY WITH PROPOFOL;  Surgeon: Daneil Dolin, MD;  Location: [facility] ENDO SUITE;  Service: Endoscopy;  Laterality: N/A;  9:30am   ESOPHAGOGASTRODUODENOSCOPY N/A 01/25/2018   Procedure: ESOPHAGOGASTRODUODENOSCOPY (EGD);  Surgeon: Danie Binder, MD;  Location: [facility] ENDO SUITE;  Service: Endoscopy;  Laterality: N/A;   ESOPHAGOGASTRODUODENOSCOPY (EGD) WITH PROPOFOL N/A 08/29/2017   Dr. Gala Romney with propofol: small hiatal hernia. 4cm fundal diverticulum.    GIVENS CAPSULE STUDY N/A 01/25/2018   Procedure: GIVENS CAPSULE STUDY;  Surgeon: Danie Binder, MD;  Location: [facility] ENDO SUITE;  Service: Endoscopy;  Laterality: N/A;   left hand middle finger     in an accident at work, amputated     OB History    Gravida  0   Para  0   Term  0   Preterm  0   AB  0   Living        SAB  0   TAB  0   Ectopic  0   Multiple      Live Births               Home Medications    Prior to Admission medications   Medication Sig Start Date End Date Taking?  Authorizing Provider  amLODipine (NORVASC) 10 MG tablet Take 1 tablet by mouth daily. 12/27/17   [provider]  atorvastatin (LIPITOR) 80 MG tablet Take 80 mg by mouth daily.  12/04/16   [provider]  azithromycin (ZITHROMAX) 250 MG tablet Take 1 tablet (250 mg total) by mouth daily. 09/06/18   Patrecia Pour, MD  budesonide-formoterol (SYMBICORT) 160-4.5 MCG/ACT inhaler Inhale 2 puffs into the lungs 2 (two) times daily. 11/23/15   Barton Dubois, MD  buPROPion [facility] SR) 150 MG 12 hr tablet Take 150 mg by mouth 2 (two) times daily. 12/04/16   [provider]  cetirizine (ZYRTEC) 10 MG tablet Take 1 tablet by mouth daily. 01/28/18   [provider]  Cholecalciferol (VITAMIN D3) 50 MCG (2000 UT) CHEW  07/08/18   [provider]  clopidogrel (PLAVIX) 75 MG tablet Take 75 mg by mouth daily.  03/29/18   [provider]  famotidine (PEPCID) 20 MG tablet Take 20 mg by mouth 2 (two) times daily.  03/29/18   [provider]  ferrous sulfate 325 (65 FE) MG EC tablet Take 1 tablet (325 mg total) by mouth 2 (two) times daily. 01/25/18 09/03/18  Isaac Bliss, Rayford Halsted, MD  furosemide (LASIX) 40 MG tablet Take 1 tablet (40 mg total) by mouth daily. 09/06/18   Patrecia Pour, MD  ipratropium-albuterol (DUONEB) 0.5-2.5 (3) MG/3ML SOLN Take 3 mLs by nebulization every 6 (six) hours as needed (for shortness of breath).    [provider]  Linaclotide Rolan Lipa) 145 MCG CAPS capsule Take 145 mcg by mouth daily as needed (for constipation).     [provider]  metoprolol tartrate (LOPRESSOR) 25 MG tablet Take 0.5 tablets (12.5 mg total) by mouth 2 (two) times daily. Patient taking differently: Take 12.5 mg by mouth daily.  08/30/17   Orson Eva, MD  omega-3 acid ethyl esters (LOVAZA) 1 g capsule Take 2 g by mouth 2 (two) times daily.  12/07/16   [provider]  pantoprazole (PROTONIX) 40 MG tablet Take 1 tablet (40 mg total) by  mouth daily. 01/25/18   Isaac Bliss, Rayford Halsted, MD  saxagliptin HCl (ONGLYZA) 5 MG TABS tablet Take 5 mg by mouth daily. Reported on 09/22/2015    [provider]  SPIRIVA HANDIHALER 18 MCG inhalation capsule Take 1 puff by mouth daily. 10/31/17   [provider]    Family History Family History  Problem Relation Age of Onset   CVA Mother 63   Cancer Neg Hx    Colon cancer Neg Hx     Social History Social History   Tobacco Use   Smoking status: Former Smoker    Packs/day: 1.00    Years: 52.00    Pack years: 52.00    Types: Cigarettes    Last  attempt to quit: 09/23/2017    Years since quitting: 1.2   Smokeless tobacco: Never Used  Substance Use Topics   Alcohol use: No   Drug use: No     Allergies   Patient has no known allergies.   Review of Systems Review of Systems ROS: Statement: All systems negative except as marked or noted in the HPI; Constitutional: Negative for fever and chills. ; ; Eyes: Negative for eye pain, redness and discharge. ; ; ENMT: Negative for ear pain, hoarseness, nasal congestion, sinus pressure and sore throat. ; ; Cardiovascular: Negative for chest pain, palpitations, diaphoresis, and peripheral edema. ; ; Respiratory: +SOB. Negative for cough, wheezing and stridor. ; ; Gastrointestinal: Negative for nausea, vomiting, diarrhea, abdominal pain, blood in stool, hematemesis, jaundice and rectal bleeding. . ; ; Genitourinary: Negative for dysuria, flank pain and hematuria. ; ; Musculoskeletal: Negative for back pain and neck pain. Negative for swelling and trauma.; ; Skin: Negative for pruritus, rash, abrasions, blisters, bruising and skin lesion.; ; Neuro: Negative for headache, lightheadedness and neck stiffness. Negative for weakness, altered level of consciousness, altered mental status, extremity weakness, paresthesias, involuntary movement, seizure and syncope.      Physical Exam Updated Vital Signs BP 107/80    Pulse 73     Temp 98.1 F (36.7 C) (Temporal)    Resp 16    Ht 5\' 4"  (1.626 m)    Wt 78.9 kg    SpO2 (!) 80%    BMI 29.87 kg/m    Patient Vitals for the past 24 hrs:  BP Temp Temp src Pulse Resp SpO2 Height Weight  12/25/18 2107 (!) 109/43 -- -- -- -- -- -- --  12/25/18 2104 (!) 112/40 -- -- 76 19 100 % -- --  12/25/18 1932 -- -- -- -- -- 99 % -- --  12/25/18 1827 107/80 98.1 F (36.7 C) Temporal 73 16 (!) 80 % -- --  12/25/18 1826 -- -- -- -- -- -- 5\' 4"  (1.626 m) 78.9 kg     Physical Exam 1840: Physical examination:  Nursing notes reviewed; Vital signs and O2 SAT reviewed;  Constitutional: Well developed, Well nourished, Uncomfortable appearing.;; Head:  Normocephalic, atraumatic; Eyes: EOMI, PERRL, No scleral icterus; ENMT: Mouth and pharynx normal, Mucous membranes dry; Neck: Supple, Full range of motion, No lymphadenopathy; Cardiovascular: Tachycardic rate and rhythm, No gallop; Respiratory: Breath sounds diminished & equal bilaterally, insp/exp wheezes bilat. Faint audible wheezing.  Speaking short sentences, sitting upright, tachypneic. Sats 70% on her usual O2 2L N/C.; Chest: Nontender, Movement normal; Abdomen: Soft, Nontender, Nondistended, Normal bowel sounds; Genitourinary: No CVA tenderness; Extremities: Pulses normal, No tenderness, No edema, No calf edema or asymmetry.; Neuro: AA&Ox3, Major CN grossly intact.  Speech clear. No gross focal motor or sensory deficits in extremities.; Skin: Color normal, Warm, Dry.    ED Treatments / Results  Labs (all labs ordered are listed, but only abnormal results are displayed)   EKG None  Radiology   Procedures Procedures (including critical care time)  Medications Ordered in ED Medications  albuterol (PROVENTIL,VENTOLIN) solution continuous neb (has no administration in time range)  ipratropium (ATROVENT) nebulizer solution 1 mg (has no administration in time range)  methylPREDNISolone sodium succinate (SOLU-MEDROL) 125 mg/2 mL  injection 125 mg (125 mg Intravenous Given 12/25/18 1901)  furosemide (LASIX) injection 40 mg (40 mg Intravenous Given 12/25/18 1901)     Initial Impression / Assessment and Plan / ED Course  I have reviewed the triage vital signs  and the nursing notes.  Pertinent labs & imaging results that were available during my care of the patient were reviewed by me and considered in my medical decision making (see chart for details).     MDM Reviewed: previous chart, nursing note and vitals Reviewed previous: labs and ECG Interpretation: labs, ECG and x-ray Total time providing critical care: 30-74 minutes. This excludes time spent performing separately reportable procedures and services. Consults: admitting MD    CRITICAL CARE Performed by: Francine Graven Total critical care time: 35 minutes Critical care time was exclusive of separately billable procedures and treating other patients. Critical care was necessary to treat or prevent imminent or life-threatening deterioration. Critical care was time spent personally by me on the following activities: development of treatment plan with patient and/or surrogate as well as nursing, discussions with consultants, evaluation of patient's response to treatment, examination of patient, obtaining history from patient or surrogate, ordering and performing treatments and interventions, ordering and review of laboratory studies, ordering and review of radiographic studies, pulse oximetry and re-evaluation of patient's condition.  Results for orders placed or performed during the hospital encounter of 82/99/37  Basic metabolic panel  Result Value Ref Range   Sodium 144 135 - 145 mmol/L   Potassium 4.3 3.5 - 5.1 mmol/L   Chloride 102 98 - 111 mmol/L   CO2 32 22 - 32 mmol/L   Glucose, Bld 99 70 - 99 mg/dL   BUN 37 (H) 8 - 23 mg/dL   Creatinine, Ser 2.47 (H) 0.44 - 1.00 mg/dL   Calcium 8.7 (L) 8.9 - 10.3 mg/dL   GFR calc non Af Amer 19 (L) >60 mL/min    GFR calc Af Amer 22 (L) >60 mL/min   Anion gap 10 5 - 15  Troponin I - Once  Result Value Ref Range   Troponin I <0.03 <0.03 ng/mL  Brain natriuretic peptide  Result Value Ref Range   B Natriuretic Peptide 984.0 (H) 0.0 - 100.0 pg/mL  CBC with Differential  Result Value Ref Range   WBC 7.1 4.0 - 10.5 K/uL   RBC 4.56 3.87 - 5.11 MIL/uL   Hemoglobin 11.9 (L) 12.0 - 15.0 g/dL   HCT 40.1 36.0 - 46.0 %   MCV 87.9 80.0 - 100.0 fL   MCH 26.1 26.0 - 34.0 pg   MCHC 29.7 (L) 30.0 - 36.0 g/dL   RDW 15.9 (H) 11.5 - 15.5 %   Platelets 297 150 - 400 K/uL   nRBC 0.0 0.0 - 0.2 %   Neutrophils Relative % 67 %   Neutro Abs 4.7 1.7 - 7.7 K/uL   Lymphocytes Relative 18 %   Lymphs Abs 1.3 0.7 - 4.0 K/uL   Monocytes Relative 7 %   Monocytes Absolute 0.5 0.1 - 1.0 K/uL   Eosinophils Relative 8 %   Eosinophils Absolute 0.6 (H) 0.0 - 0.5 K/uL   Basophils Relative 0 %   Basophils Absolute 0.0 0.0 - 0.1 K/uL   Immature Granulocytes 0 %   Abs Immature Granulocytes 0.02 0.00 - 0.07 K/uL  Protime-INR  Result Value Ref Range   Prothrombin Time 13.1 11.4 - 15.2 seconds   INR 1.0 0.8 - 1.2   Dg Chest Port 1 View Result Date: 12/25/2018 CLINICAL DATA:  Shortness of breath. EXAM: PORTABLE CHEST 1 VIEW COMPARISON:  Radiographs of September 03, 2018. FINDINGS: Stable cardiomegaly. Atherosclerosis of thoracic aorta is noted. Mild bilateral diffuse interstitial densities are noted concerning for pulmonary edema. No pneumothorax is  noted. Minimal bilateral pleural effusions may be present. Bony thorax is unremarkable. IMPRESSION: Stable cardiomegaly is noted with probable mild bilateral pulmonary edema. Minimal pleural effusions are noted. Electronically Signed   By: Marijo Conception, M.D.   On: 12/25/2018 19:23    Erica [name] was evaluated in Emergency Department on 12/25/2018 for the symptoms described in the history of present illness. She was evaluated in the context of the global COVID-19 pandemic, which  necessitated consideration that the patient might be at risk for infection with the SARS-CoV-2 virus that causes COVID-19. Institutional protocols and algorithms that pertain to the evaluation of patients at risk for COVID-19 are in a state of rapid change based on information released by regulatory bodies including the CDC and federal and state organizations. These policies and algorithms were followed during the patient's care in the ED.    2115:  On arrival: pt sitting upright, tachypneic, tachycardic, hypoxic with Sats 70's on her usual O2 2L N/C, lungs diminished with wheezing. IV solumedrol, IV lasix, and MDI given. After neb: pt appears more comfortable at rest, less tachypneic, Sats increasing to 99% on O2 4L N/C, lungs continue diminished. BUN/Cr near baseline. BNP elevated from baseline and CXR with pulmonary edema; will need admission for further tx.  T/C returned from [facility] Labs, case discussed, including:  HPI, pertinent PM/SHx, VS/PE, dx testing, ED course and treatment:  Agreeable to admit.       Final Clinical Impressions(s) / ED Diagnoses   Final diagnoses:  None    ED Discharge Orders    None       Francine Graven, DO 12/28/18 1548

## 2018-12-25 NOTE — ED Notes (Signed)
o2 sats 84/86% on 3l [place], increased O2 to 4l/[place] sats up to 98%, patient SOB decreased.

## 2018-12-26 DIAGNOSIS — I5031 Acute diastolic (congestive) heart failure: Secondary | ICD-10-CM

## 2018-12-26 DIAGNOSIS — J9621 Acute and chronic respiratory failure with hypoxia: Secondary | ICD-10-CM | POA: Diagnosis not present

## 2018-12-26 DIAGNOSIS — J441 Chronic obstructive pulmonary disease with (acute) exacerbation: Secondary | ICD-10-CM | POA: Diagnosis not present

## 2018-12-26 LAB — CBC
HCT: 36.6 % (ref 36.0–46.0)
Hemoglobin: 10.8 g/dL — ABNORMAL LOW (ref 12.0–15.0)
MCH: 25.8 pg — ABNORMAL LOW (ref 26.0–34.0)
MCHC: 29.5 g/dL — ABNORMAL LOW (ref 30.0–36.0)
MCV: 87.6 fL (ref 80.0–100.0)
Platelets: 281 10*3/uL (ref 150–400)
RBC: 4.18 MIL/uL (ref 3.87–5.11)
RDW: 15.7 % — ABNORMAL HIGH (ref 11.5–15.5)
WBC: 5.5 10*3/uL (ref 4.0–10.5)
nRBC: 0 % (ref 0.0–0.2)

## 2018-12-26 LAB — BASIC METABOLIC PANEL
Anion gap: 10 (ref 5–15)
BUN: 42 mg/dL — ABNORMAL HIGH (ref 8–23)
CO2: 31 mmol/L (ref 22–32)
Calcium: 8.5 mg/dL — ABNORMAL LOW (ref 8.9–10.3)
Chloride: 102 mmol/L (ref 98–111)
Creatinine, Ser: 2.54 mg/dL — ABNORMAL HIGH (ref 0.44–1.00)
GFR calc Af Amer: 22 mL/min — ABNORMAL LOW (ref >60)
GFR calc non Af Amer: 19 mL/min — ABNORMAL LOW (ref >60)
Glucose, Bld: 184 mg/dL — ABNORMAL HIGH (ref 70–99)
Potassium: 4.9 mmol/L (ref 3.5–5.1)
Sodium: 143 mmol/L (ref 135–145)

## 2018-12-26 LAB — GLUCOSE, CAPILLARY
Glucose-Capillary: 159 mg/dL — ABNORMAL HIGH (ref 70–99)
Glucose-Capillary: 201 mg/dL — ABNORMAL HIGH (ref 70–99)

## 2018-12-26 MED ORDER — PREDNISONE 20 MG PO TABS: 40.0000 mg | ORAL_TABLET | Freq: Every day | ORAL | 0 refills | Status: AC

## 2018-12-26 MED ORDER — UMECLIDINIUM BROMIDE 62.5 MCG/INH IN AEPB
1.0000 | INHALATION_SPRAY | Freq: Every day | RESPIRATORY_TRACT | Status: DC
  Filled 2018-12-26: qty 7

## 2018-12-26 MED ORDER — FAMOTIDINE 20 MG PO TABS
20.0000 mg | ORAL_TABLET | Freq: Every day | ORAL | Status: DC
  Administered 2018-12-26: 20 mg via ORAL
  Filled 2018-12-26: qty 1

## 2018-12-26 NOTE — Progress Notes (Signed)
Patient ambulated on 2L O2 with oxygen sat of 83%  Oxygen increasedto 3L with sat of 90%

## 2018-12-26 NOTE — Progress Notes (Signed)
Increased pt o2 to 4lpm cann due to decreased spo2 of 70% on 2lpm cann nurse informed

## 2018-12-26 NOTE — Progress Notes (Signed)
In and out cath patient. Returned from bladder was 333ml of urine. Will continue to monitor patient throughout shift.

## 2018-12-26 NOTE — Progress Notes (Signed)
Pt hasnt voided since being admitted to floor. Took patient to bathroom to attempt to void. Pt unable to void. Bladder scanned patient. Bladder scan showed 431 in bladder. Nursing order to in and out catheter patient if patient is unable to void.

## 2018-12-26 NOTE — Care Management Obs Status (Signed)
[place] NOTIFICATION   Patient Details  Name: Erica [name] MRN: [id number] Date of Birth: Sep 16, 1951   Medicare Observation Status Notification Given:  Yes    [place], LCSW 12/26/2018, 1:30 PM

## 2018-12-26 NOTE — TOC Transition Note (Addendum)
Transition of Care [facility]) - CM/SW Discharge Note   Patient Details  Name: Erica [name] MRN: [id number] Date of Birth: [birth year]  [facility] of [facility]) CM/SW Contact:  Sherald Barge, RN Phone Number: 12/26/2018, 1:35 PM   Clinical Narrative:   On 3LPM pta. Pt communicates no TOC needs.     Final next level of care: Home/Self Care     Expected Discharge Plan and Services Expected Discharge Plan: Home/Self Care       Living arrangements for the past 2 months: Single Family Home Expected Discharge Date: 12/26/18                   Prior Living Arrangements/Services Living arrangements for the past 2 months: Single Family Home Lives with:: Adult Children Patient language and need for interpreter reviewed:: Yes Do you feel safe going back to the place where you live?: Yes      Need for Family Participation in Patient Care: Yes (Comment) Care giver support system in place?: Yes (comment)   Criminal Activity/Legal Involvement Pertinent to Current Situation/Hospitalization: No - Comment as needed  Activities of Daily Living Home Assistive Devices/Equipment: Dentures (specify type), Cane (specify quad or straight), Oxygen, Nebulizer ADL Screening (condition at time of admission) Patient's cognitive ability adequate to safely complete daily activities?: Yes Is the patient deaf or have difficulty hearing?: No Does the patient have difficulty seeing, even when wearing glasses/contacts?: No Does the patient have difficulty concentrating, remembering, or making decisions?: No Patient able to express need for assistance with ADLs?: Yes Does the patient have difficulty dressing or bathing?: No Independently performs ADLs?: Yes (appropriate for developmental age) Does the patient have difficulty walking or climbing stairs?: Yes Weakness of Legs: None Weakness of Arms/Hands: None     Emotional Assessment Appearance:: Appears older than stated  age Attitude/Demeanor/Rapport: Other (comment)(appropriate) Affect (typically observed): Pleasant, Appropriate Orientation: : Oriented to Self, Oriented to Place, Oriented to  Time, Oriented to Situation      Admission diagnosis:  Shortness of breath [R06.02] Hypoxia [R09.02] COPD with acute exacerbation ([place]) [J44.1] Acute on chronic congestive heart failure, unspecified heart failure type [facility]) [I50.9] Patient Active Problem List   Diagnosis Date Noted  . Acute hypoxemic respiratory failure ([place]) 09/03/2018  . Acute diastolic (congestive) heart failure ([place]) 03/09/2018  . AVM (arteriovenous malformation) of duodenum, acquired   . GIB (gastrointestinal bleeding) 01/23/2018  . CKD (chronic kidney disease) 01/23/2018  . Hyperkalemia 12/21/2017  . Hyperlipidemia 12/18/2017  . Hypomagnesemia 12/17/2017  . Acute renal failure superimposed on stage 3 chronic kidney disease ([place]) 12/07/2017  . Acute on chronic respiratory failure with hypoxia ([place]) 12/07/2017  . COPD with acute exacerbation ([place]) 12/07/2017  . Acute diastolic CHF (congestive heart failure) ([place]) 09/19/2017  . Iron deficiency anemia due to chronic blood loss   . Acute diverticulitis 08/29/2017  . Heme positive stool   . Symptomatic anemia   . GI bleed 08/27/2017  . Elevated troponin 08/27/2017  . (HFpEF) heart failure with preserved ejection fraction ([place]) 08/27/2017  . Acute dyspnea   . Acute on chronic respiratory failure ([place]) 02/11/2017  . Acute on chronic diastolic CHF (congestive heart failure) ([place]) 02/11/2017  . Benign essential HTN 11/20/2015  . GERD (gastroesophageal reflux disease) 11/20/2015  . Tobacco abuse 11/20/2015  . Type 2 diabetes mellitus with hyperglycemia ([place]) 11/20/2015  . COPD exacerbation ([place]) 09/05/2015  . Chronic pain syndrome 09/05/2015  . Hypertension 09/05/2015  . Diabetes mellitus type 2, controlled ([place]) 09/05/2015  .  Chronic respiratory failure ([place]) 09/05/2015  . OLECRANON  BURSITIS, LEFT 08/04/2010   PCP:  Iona Beard, MD Pharmacy:   [facility], [place] [name] [place] [address] Phone: [phone] Fax: [place], [place] [address] Phone: [phone] Fax: [phone]     Readmission Risk Interventions Readmission Risk Prevention Plan 12/26/2018  Transportation Screening Complete  Medication Review ([place]) Complete  PCP or Specialist appointment within 3-5 days of discharge Complete  HRI or [place] Complete  SW Recovery Care/Counseling Consult Complete  [place] Not Applicable  Some recent data might be hidden

## 2018-12-26 NOTE — Plan of Care (Signed)

## 2018-12-26 NOTE — Discharge Summary (Signed)
Physician Discharge Summary  Erica [name]:[id number] DOB: Apr 05, 1951 DOA: 12/25/2018  PCP: Iona Beard, MD  Admit date: 12/25/2018  Discharge date: 12/26/2018  Admitted From:Home  Disposition:  Home  Recommendations for Outpatient Follow-up:  1. Follow up with PCP in 1-2 weeks 2. Monitor blood pressures and weights in office setting 3. Continue on blood pressure medications as ordered and hold Coreg, Lasix, and amlodipine given some soft blood pressure readings while here.  Maintain on torsemide and other blood pressure medications as noted. 4. Finish course of prednisone over the next 5 days and stay on 3 L home nasal cannula 5. Use home duo nebs as needed for shortness of breath or wheezing  Home Health: None  Equipment/Devices: Has home 3 L nasal cannula  Discharge Condition: Stable  CODE STATUS: Full  Diet recommendation: Heart Healthy/carb modified  Brief/Interim Summary: Per HPI: Erica [name]  is a 68 y.o. female, with past medical history significant for COPD, chronic respiratory failure on 2 L oxygen, diastolic CHF, type 2 diabetes mellitus, dyslipidemia, hypertension, patient presents to ED secondary to complaints of shortness of breath, reports worsening over last 2 days, initially exertional, as well reports orthopnea currently, she reports mild pitting edema as well, denies fever, chills, recent travel, dry cough, any sick contacts, reports wheezing, with home inhalers did not help much which prompted her to come to ED. - in ED patient was hypoxic on 2 L nasal cannula at 84%, improved on 4 L nasal cannula, chest x-ray significant for edema and mild effusion, she had significant wheezing improved with inhaler treatment and steroids, no leukocytosis, creatinine elevated at 2.4, which is her baseline, he received 40 mg of IV Lasix as he had elevated proBNP.  Patient was admitted for multifactorial acute on chronic hypoxemic respiratory failure with COPD exacerbation and  acute on chronic diastolic congestive heart failure.  She is actually responded very well to treatments overnight with Lasix and IV steroids and breathing treatments.  She actually feels as though she is back to her usual baseline and is requiring 3 L nasal cannula while ambulating, but is otherwise asymptomatic.  She has been instructed to use home 3 L nasal cannula for now and maintain on her usual home diuresis as well as oral prednisone that has been prescribed for her.  She has duo nebs at home as needed as well.  She is otherwise stable for discharge with no acute events noted during this brief admission.  Discharge Diagnoses:  Active Problems:   COPD exacerbation (HCC)   Diabetes mellitus type 2, controlled ([place])   Benign essential HTN   GERD (gastroesophageal reflux disease)   Tobacco abuse   Acute on chronic respiratory failure (HCC)   Acute on chronic diastolic CHF (congestive heart failure) (HCC)   Acute dyspnea   CKD (chronic kidney disease)   Acute diastolic (congestive) heart failure ([place])  Principal discharge diagnosis: Multifactorial acute on chronic hypoxemic respiratory failure with COPD exacerbation and acute on chronic diastolic heart failure.  Discharge Instructions  Discharge Instructions    Diet - low sodium heart healthy   Complete by:  As directed    Increase activity slowly   Complete by:  As directed      Allergies as of 12/26/2018   No Known Allergies     Medication List    STOP taking these medications   amLODipine 10 MG tablet Commonly known as:  NORVASC   carvedilol 12.5 MG tablet Commonly known as:  COREG  furosemide 40 MG tablet Commonly known as:  LASIX     TAKE these medications   aspirin EC 81 MG tablet Take 162 mg by mouth daily.   atorvastatin 80 MG tablet Commonly known as:  LIPITOR Take 80 mg by mouth daily.   budesonide-formoterol 160-4.5 MCG/ACT inhaler Commonly known as:  Symbicort Inhale 2 puffs into the lungs 2 (two)  times daily.   buPROPion 150 MG 12 hr tablet Commonly known as:  WELLBUTRIN SR Take 150 mg by mouth 2 (two) times daily.   clopidogrel 75 MG tablet Commonly known as:  PLAVIX Take 75 mg by mouth daily.   famotidine 20 MG tablet Commonly known as:  PEPCID Take 20 mg by mouth daily.   ferrous sulfate 325 (65 FE) MG EC tablet Take 1 tablet (325 mg total) by mouth 2 (two) times daily.   ipratropium-albuterol 0.5-2.5 (3) MG/3ML Soln Commonly known as:  DUONEB Take 3 mLs by nebulization every 6 (six) hours as needed (for shortness of breath).   Linzess 145 MCG Caps capsule Generic drug:  linaclotide Take 145 mcg by mouth daily as needed (for constipation).   metoprolol tartrate 25 MG tablet Commonly known as:  LOPRESSOR Take 0.5 tablets (12.5 mg total) by mouth 2 (two) times daily. What changed:    how much to take  when to take this   omega-3 acid ethyl esters 1 g capsule Commonly known as:  LOVAZA Take 1 g by mouth daily.   Onglyza 5 MG Tabs tablet Generic drug:  saxagliptin HCl Take 5 mg by mouth daily. Reported on 09/22/2015   pantoprazole 40 MG tablet Commonly known as:  PROTONIX Take 1 tablet (40 mg total) by mouth daily.   potassium chloride SA 20 MEQ tablet Commonly known as:  K-DUR,KLOR-CON Take 20 mEq by mouth 2 (two) times daily.   predniSONE 20 MG tablet Commonly known as:  Deltasone Take 2 tablets (40 mg total) by mouth daily for 5 days.   Spiriva HandiHaler 18 MCG inhalation capsule Generic drug:  tiotropium Take 1 puff by mouth daily.   torsemide 20 MG tablet Commonly known as:  DEMADEX Take 20 mg by mouth daily. Take 1 tablet on Monday, Wednesday, and Friday only.   valsartan-hydrochlorothiazide 160-25 MG tablet Commonly known as:  DIOVAN-HCT Take 1 tablet by mouth daily.      Follow-up Information    Iona Beard, MD Follow up in 1 week(s).   Specialty:  Family Medicine Contact information: [address] [place]  [address] [phone]        Arnoldo Lenis, MD .   Specialty:  Cardiology Contact information: [address] [place] [address] [phone]          No Known Allergies  Consultations:  None   Procedures/Studies: Dg Chest Port 1 View  Result Date: 12/25/2018 CLINICAL DATA:  Shortness of breath. EXAM: PORTABLE CHEST 1 VIEW COMPARISON:  Radiographs of September 03, 2018. FINDINGS: Stable cardiomegaly. Atherosclerosis of thoracic aorta is noted. Mild bilateral diffuse interstitial densities are noted concerning for pulmonary edema. No pneumothorax is noted. Minimal bilateral pleural effusions may be present. Bony thorax is unremarkable. IMPRESSION: Stable cardiomegaly is noted with probable mild bilateral pulmonary edema. Minimal pleural effusions are noted. Electronically Signed   By: Marijo Conception, M.D.   On: 12/25/2018 19:23     Discharge Exam: Vitals:   12/26/18 0517 12/26/18 0800  BP: (!) 100/46   Pulse: 75   Resp:  20   Temp: 98.6 F (37 C)   SpO2: 97% 92%   Vitals:   12/25/18 2328 12/26/18 0032 12/26/18 0517 12/26/18 0800  BP: (!) 118/58  (!) 100/46   Pulse: 78  75   Resp: 16  20   Temp: 98.1 F (36.7 C)  98.6 F (37 C)   TempSrc: Oral  Oral   SpO2: 92% (!) 70% 97% 92%  Weight:   68.6 kg   Height:        General: Pt is alert, awake, not in acute distress Cardiovascular: RRR, S1/S2 +, no rubs, no gallops Respiratory: CTA bilaterally, no wheezing, no rhonchi, on 3 L nasal cannula. Abdominal: Soft, NT, ND, bowel sounds + Extremities: no edema, no cyanosis    The results of significant diagnostics from this hospitalization (including imaging, microbiology, ancillary and laboratory) are listed below for reference.     Microbiology: No results found for this or any previous visit (from the past 240 hour(s)).   Labs: BNP (last 3 results) Recent Labs    09/03/18 1620 12/25/18 1852  BNP 200.0* 782.9*   Basic Metabolic Panel: Recent Labs   Lab 12/25/18 1852 12/26/18 0449  NA 144 143  K 4.3 4.9  CL 102 102  CO2 32 31  GLUCOSE 99 184*  BUN 37* 42*  CREATININE 2.47* 2.54*  CALCIUM 8.7* 8.5*   Liver Function Tests: No results for input(s): AST, ALT, ALKPHOS, BILITOT, PROT, ALBUMIN in the last 168 hours. No results for input(s): LIPASE, AMYLASE in the last 168 hours. No results for input(s): AMMONIA in the last 168 hours. CBC: Recent Labs  Lab 12/25/18 1852 12/26/18 0449  WBC 7.1 5.5  NEUTROABS 4.7  --   HGB 11.9* 10.8*  HCT 40.1 36.6  MCV 87.9 87.6  PLT 297 281   Cardiac Enzymes: Recent Labs  Lab 12/25/18 1852  TROPONINI <0.03   BNP: Invalid input(s): POCBNP CBG: Recent Labs  Lab 12/26/18 0735  GLUCAP 159*   D-Dimer No results for input(s): DDIMER in the last 72 hours. Hgb A1c No results for input(s): HGBA1C in the last 72 hours. Lipid Profile No results for input(s): CHOL, HDL, LDLCALC, TRIG, CHOLHDL, LDLDIRECT in the last 72 hours. Thyroid function studies No results for input(s): TSH, T4TOTAL, T3FREE, THYROIDAB in the last 72 hours.  Invalid input(s): FREET3 Anemia work up No results for input(s): VITAMINB12, FOLATE, FERRITIN, TIBC, IRON, RETICCTPCT in the last 72 hours. Urinalysis    Component Value Date/Time   COLORURINE YELLOW 09/05/2018 1210   APPEARANCEUR CLEAR 09/05/2018 1210   LABSPEC 1.015 09/05/2018 1210   PHURINE 5.0 09/05/2018 1210   GLUCOSEU NEGATIVE 09/05/2018 1210   HGBUR NEGATIVE 09/05/2018 1210   BILIRUBINUR NEGATIVE 09/05/2018 1210   KETONESUR NEGATIVE 09/05/2018 1210   PROTEINUR NEGATIVE 09/05/2018 1210   UROBILINOGEN 0.2 03/14/2013 1847   NITRITE POSITIVE (A) 09/05/2018 1210   LEUKOCYTESUR NEGATIVE 09/05/2018 1210   Sepsis Labs Invalid input(s): PROCALCITONIN,  WBC,  LACTICIDVEN Microbiology No results found for this or any previous visit (from the past 240 hour(s)).   Time coordinating discharge: 35 minutes  SIGNED:   Rodena Goldmann, DO [facility] 12/26/2018, 11:18 AM  If 7PM-7AM, please contact night-coverage [email or web address] Password [email or web address]

## 2018-12-26 NOTE — Care Management CC44 (Signed)
Condition Code 44 Documentation Completed  Patient Details  Name: Erica [name] MRN: [id number] Date of Birth: Dec 24, 1950   Condition Code 44 given:  Yes Patient signature on Condition Code 44 notice:  Yes Documentation of 2 MD's agreement:  Yes Code 44 added to claim:  Yes    Sherald Barge, RN 12/26/2018, 1:32 PM

## 2018-12-26 NOTE — Plan of Care (Signed)
  Problem: Education: Goal: Knowledge of General Education information will improve Description Including pain rating scale, medication(s)/side effects and non-pharmacologic comfort measures 12/26/2018 1136 by Rance Muir, RN Outcome: Adequate for Discharge 12/26/2018 0959 by Rance Muir, RN Outcome: Progressing   Problem: Health Behavior/Discharge Planning: Goal: Ability to manage health-related needs will improve 12/26/2018 1136 by Rance Muir, RN Outcome: Adequate for Discharge 12/26/2018 0959 by Rance Muir, RN Outcome: Progressing   Problem: Clinical Measurements: Goal: Ability to maintain clinical measurements within normal limits will improve 12/26/2018 1136 by Rance Muir, RN Outcome: Adequate for Discharge 12/26/2018 0959 by Rance Muir, RN Outcome: Progressing Goal: Will remain free from infection 12/26/2018 1136 by Rance Muir, RN Outcome: Adequate for Discharge 12/26/2018 0959 by Rance Muir, RN Outcome: Progressing Goal: Diagnostic test results will improve 12/26/2018 1136 by Rance Muir, RN Outcome: Adequate for Discharge 12/26/2018 0959 by Rance Muir, RN Outcome: Progressing Goal: Respiratory complications will improve 12/26/2018 1136 by Rance Muir, RN Outcome: Adequate for Discharge 12/26/2018 0959 by Rance Muir, RN Outcome: Progressing Goal: Cardiovascular complication will be avoided 12/26/2018 1136 by Rance Muir, RN Outcome: Adequate for Discharge 12/26/2018 0959 by Rance Muir, RN Outcome: Progressing   Problem: Activity: Goal: Risk for activity intolerance will decrease 12/26/2018 1136 by Rance Muir, RN Outcome: Adequate for Discharge 12/26/2018 0959 by Rance Muir, RN Outcome: Progressing   Problem: Nutrition: Goal: Adequate nutrition will be maintained 12/26/2018 1136 by Rance Muir, RN Outcome: Adequate for Discharge 12/26/2018 0959 by Rance Muir, RN Outcome: Progressing   Problem: Coping: Goal: Level of anxiety will decrease 12/26/2018 1136 by Rance Muir, RN Outcome: Adequate for Discharge 12/26/2018  0959 by Rance Muir, RN Outcome: Progressing   Problem: Elimination: Goal: Will not experience complications related to bowel motility 12/26/2018 1136 by Rance Muir, RN Outcome: Adequate for Discharge 12/26/2018 0959 by Rance Muir, RN Outcome: Progressing Goal: Will not experience complications related to urinary retention 12/26/2018 1136 by Rance Muir, RN Outcome: Adequate for Discharge 12/26/2018 0959 by Rance Muir, RN Outcome: Progressing   Problem: Pain Managment: Goal: General experience of comfort will improve 12/26/2018 1136 by Rance Muir, RN Outcome: Adequate for Discharge 12/26/2018 0959 by Rance Muir, RN Outcome: Progressing   Problem: Safety: Goal: Ability to remain free from injury will improve 12/26/2018 1136 by Rance Muir, RN Outcome: Adequate for Discharge 12/26/2018 0959 by Rance Muir, RN Outcome: Progressing   Problem: Skin Integrity: Goal: Risk for impaired skin integrity will decrease 12/26/2018 1136 by Rance Muir, RN Outcome: Adequate for Discharge 12/26/2018 [place] by Rance Muir, RN Outcome: Progressing

## 2018-12-27 DIAGNOSIS — J449 Chronic obstructive pulmonary disease, unspecified: Secondary | ICD-10-CM | POA: Diagnosis not present

## 2018-12-31 DIAGNOSIS — I10 Essential (primary) hypertension: Secondary | ICD-10-CM | POA: Diagnosis not present

## 2018-12-31 DIAGNOSIS — E1122 Type 2 diabetes mellitus with diabetic chronic kidney disease: Secondary | ICD-10-CM | POA: Diagnosis not present

## 2018-12-31 DIAGNOSIS — Z9981 Dependence on supplemental oxygen: Secondary | ICD-10-CM | POA: Diagnosis not present

## 2018-12-31 DIAGNOSIS — Z720 Tobacco use: Secondary | ICD-10-CM | POA: Diagnosis not present

## 2018-12-31 DIAGNOSIS — J449 Chronic obstructive pulmonary disease, unspecified: Secondary | ICD-10-CM | POA: Diagnosis not present

## 2019-01-15 DIAGNOSIS — I10 Essential (primary) hypertension: Secondary | ICD-10-CM | POA: Diagnosis not present

## 2019-01-15 DIAGNOSIS — E782 Mixed hyperlipidemia: Secondary | ICD-10-CM | POA: Diagnosis not present

## 2019-01-15 DIAGNOSIS — E119 Type 2 diabetes mellitus without complications: Secondary | ICD-10-CM | POA: Diagnosis not present

## 2019-01-22 DIAGNOSIS — I10 Essential (primary) hypertension: Secondary | ICD-10-CM | POA: Diagnosis not present

## 2019-01-22 DIAGNOSIS — E119 Type 2 diabetes mellitus without complications: Secondary | ICD-10-CM | POA: Diagnosis not present

## 2019-01-22 DIAGNOSIS — J449 Chronic obstructive pulmonary disease, unspecified: Secondary | ICD-10-CM | POA: Diagnosis not present

## 2019-01-22 DIAGNOSIS — N183 Chronic kidney disease, stage 3 (moderate): Secondary | ICD-10-CM | POA: Diagnosis not present

## 2019-01-26 DIAGNOSIS — J449 Chronic obstructive pulmonary disease, unspecified: Secondary | ICD-10-CM | POA: Diagnosis not present

## 2019-02-20 ENCOUNTER — Telehealth: Payer: Self-pay | Admitting: Cardiology

## 2019-02-20 NOTE — Telephone Encounter (Signed)
Virtual Visit Pre-Appointment Phone Call  "(Name), I am calling you today to discuss your upcoming appointment. We are currently trying to limit exposure to the virus that causes COVID-19 by seeing patients at home rather than in the office."  1. "What is the BEST phone number to call the day of the visit?" - include this in appointment notes  2. Do you have or have access to (through a family member/friend) a smartphone with video capability that we can use for your visit?" a. If yes - list this number in appt notes as cell (if different from BEST phone #) and list the appointment type as a VIDEO visit in appointment notes b. If no - list the appointment type as a PHONE visit in appointment notes  3. Confirm consent - "In the setting of the current Covid19 crisis, you are scheduled for a (phone or video) visit with your provider on (date) at (time).  Just as we do with many in-office visits, in order for you to participate in this visit, we must obtain consent.  If you'd like, I can send this to your mychart (if signed up) or email for you to review.  Otherwise, I can obtain your verbal consent now.  All virtual visits are billed to your insurance company just like a normal visit would be.  By agreeing to a virtual visit, we'd like you to understand that the technology does not allow for your provider to perform an examination, and thus may limit your provider's ability to fully assess your condition. If your provider identifies any concerns that need to be evaluated in person, we will make arrangements to do so.  Finally, though the technology is pretty good, we cannot assure that it will always work on either your or our end, and in the setting of a video visit, we may have to convert it to a phone-only visit.  In either situation, we cannot ensure that we have a secure connection.  Are you willing to proceed?" STAFF: Did the patient verbally acknowledge consent to telehealth visit? Document  YES/NO here: Yes  4. Advise patient to be prepared - "Two hours prior to your appointment, go ahead and check your blood pressure, pulse, oxygen saturation, and your weight (if you have the equipment to check those) and write them all down. When your visit starts, your provider will ask you for this information. If you have an Apple Watch or Kardia device, please plan to have heart rate information ready on the day of your appointment. Please have a pen and paper handy nearby the day of the visit as well."  5. Give patient instructions for MyChart download to smartphone OR [email or web address] as below if video visit (depending on what platform provider is using)  6. Inform patient they will receive a phone call 15 minutes prior to their appointment time (may be from unknown caller ID) so they should be prepared to answer    TELEPHONE CALL NOTE  Erica [name] has been deemed a candidate for a follow-up tele-health visit to limit community exposure during the Covid-19 pandemic. I spoke with the patient via phone to ensure availability of phone/video source, confirm preferred email & phone number, and discuss instructions and expectations.  I reminded Erica [name] to be prepared with any vital sign and/or heart rhythm information that could potentially be obtained via home monitoring, at the time of her visit. I reminded Erica [name] to expect a phone call prior to  her visit.  [name] 02/20/2019 2:55 PM

## 2019-02-26 ENCOUNTER — Telehealth (INDEPENDENT_AMBULATORY_CARE_PROVIDER_SITE_OTHER): Payer: Medicare HMO | Admitting: Cardiology

## 2019-02-26 ENCOUNTER — Encounter: Payer: Self-pay | Admitting: Cardiology

## 2019-02-26 ENCOUNTER — Other Ambulatory Visit: Payer: Self-pay

## 2019-02-26 VITALS — BP 135/57 | HR 79 | Ht 64.0 in | Wt 176.0 lb

## 2019-02-26 DIAGNOSIS — R0602 Shortness of breath: Secondary | ICD-10-CM | POA: Diagnosis not present

## 2019-02-26 DIAGNOSIS — J449 Chronic obstructive pulmonary disease, unspecified: Secondary | ICD-10-CM | POA: Diagnosis not present

## 2019-02-26 DIAGNOSIS — I10 Essential (primary) hypertension: Secondary | ICD-10-CM

## 2019-02-26 DIAGNOSIS — E782 Mixed hyperlipidemia: Secondary | ICD-10-CM

## 2019-02-26 DIAGNOSIS — I5032 Chronic diastolic (congestive) heart failure: Secondary | ICD-10-CM

## 2019-02-26 MED ORDER — TORSEMIDE 20 MG PO TABS: 20.0000 mg | ORAL_TABLET | Freq: Every day | ORAL | 3 refills | Status: DC

## 2019-02-26 NOTE — Progress Notes (Signed)
Virtual Visit via Telephone Note   This visit type was conducted due to national recommendations for restrictions regarding the COVID-19 Pandemic (e.g. social distancing) in an effort to limit this patient's exposure and mitigate transmission in our community.  Due to her co-morbid illnesses, this patient is at least at moderate risk for complications without adequate follow up.  This format is felt to be most appropriate for this patient at this time.  The patient did not have access to video technology/had technical difficulties with video requiring transitioning to audio format only (telephone).  All issues noted in this document were discussed and addressed.  No physical exam could be performed with this format.  Please refer to the patient's chart for her  consent to telehealth for [facility].   Date:  02/26/2019   ID:  Erica [name], DOB Mar 13, 1951, MRN [id number]  Patient Location: Home Provider Location: Home  PCP:  Iona Beard, MD  Cardiologist:  Carlyle Dolly, MD  Electrophysiologist:  None   Evaluation Performed:  Follow-Up Visit  Chief Complaint:  Hospital follow up visit  History of Present Illness:    Erica [name] is a 68 y.o. female seen today for follow up of the following medical problems  1. SOB - admit 11/2017 with fever, leukocytosis, severe hypoxia to 70s - mild trop elevation at that time, peak 0.53. Thought to be demand ischemia. Based on risk factors plans for outpatient stress pending symptoms.  - 11/2017 echo LVEF 60-65%, no WMAs  - no recent chest pain. Chronic SOB/DOE has increased   - admitted 12/2018 with SOB, managed for both CHF and COPD exacerbations - since discharge has been doing well. Breathing is stable at baseline.   2. Chronic diastolic HF -75/1025 ech LVEF 65%, abnormal diasotlic function with elevated filling pressures   - no recent edema - weights stable since discharge around 176 lbs - ran out of torsemide last week   3. COPD - on home O2 3L  4. Hypertension - some low bp' meds during recent admissoin, coreg and norvasc stopped   5. Hyperlipidemia - she is compliant with statin   6. DM2 - followed by pcp   7. CKD IV - last Cr 2.5   8. PSVT - no recent symptoms  9 GI bleed - admit 01/2018 with GI bleed. Hgb 7.3 down from 9. Had heme +stools.  - EGD normal. Bleed thought due to AVMs. Transfused 2 units pRBC   The patient does not have symptoms concerning for COVID-19 infection (fever, chills, cough, or new shortness of breath).    Past Medical History:  Diagnosis Date  . Arthritis   . CHF (congestive heart failure) ([place])   . Chronic back pain   . COPD (chronic obstructive pulmonary disease) (HCC)    2L home O2  . Diabetes mellitus without complication ([place])   . Hyperlipidemia   . Hypertension   . Stroke (cerebrum) [facility])    Past Surgical History:  Procedure Laterality Date  . ABDOMINAL HYSTERECTOMY    . colonoscopy  2009   Dr. Gala Romney: cluster of diminutive rectal polyps and 2 diminutive polyps in rectosigmoid junction, hyperplastic  . COLONOSCOPY WITH PROPOFOL N/A 10/18/2017   Procedure: COLONOSCOPY WITH PROPOFOL;  Surgeon: Daneil Dolin, MD;  [facility];  Service: Endoscopy;  Laterality: N/A;  9:30am  . ESOPHAGOGASTRODUODENOSCOPY N/A 01/25/2018   Procedure: ESOPHAGOGASTRODUODENOSCOPY (EGD);  Surgeon: Danie Binder, MD;  Location: [facility];  Service: Endoscopy;  Laterality:  N/A;  . ESOPHAGOGASTRODUODENOSCOPY (EGD) WITH PROPOFOL N/A 08/29/2017   Dr. Gala Romney with propofol: small hiatal hernia. 4cm fundal diverticulum.   Marland Kitchen GIVENS CAPSULE STUDY N/A 01/25/2018   Procedure: GIVENS CAPSULE STUDY;  Surgeon: Danie Binder, MD;  Location: [facility];  Service: Endoscopy;  Laterality: N/A;  . left hand middle finger     in an accident at work, amputated     No outpatient medications have been marked as taking for the 02/26/19 encounter (Appointment) with  Arnoldo Lenis, MD.     Allergies:   Patient has no known allergies.   Social History   Tobacco Use  . Smoking status: Former Smoker    Packs/day: 1.00    Years: 52.00    Pack years: 52.00    Types: Cigarettes    Last attempt to quit: 09/23/2017    Years since quitting: 1.4  . Smokeless tobacco: Never Used  Substance Use Topics  . Alcohol use: No  . Drug use: No     Family Hx: The patient's family history includes CVA (age of onset: 19) in her mother. There is no history of Cancer or Colon cancer.  ROS:   Please see the history of present illness.    All other systems reviewed and are negative.   Prior CV studies:   The following studies were reviewed today:  11/2017 echo Study Conclusions  - Limited echo to evaluate LV function. - Left ventricle: The cavity size was normal. Wall thickness was normal. Systolic function was normal. The estimated ejection fraction was in the range of 60% to 65%. Wall motion was normal; there were no regional wall motion abnormalities. - Aortic valve: Valve area (VTI): 2.17 cm^2. Valve area (Vmax): 2.14 cm^2. - Mitral valve: There was mild regurgitation.   08/2018 echo Study Conclusions  - Left ventricle: The cavity size was normal. Wall thickness was   increased in a pattern of mild LVH. Systolic function was normal.   The estimated ejection fraction was 65%. Wall motion was normal;   there were no regional wall motion abnormalities. Findings   consistent with left ventricular diastolic dysfunction, grade   indeterminate. Doppler parameters are consistent with high   ventricular filling pressure. - Mitral valve: Mildly thickened leaflets . Mild anterior leaflet   prolapse. There was trivial regurgitation. - Left atrium: The atrium was moderately dilated. Labs/Other Tests and Data Reviewed:    EKG:  No ECG reviewed.  Recent Labs: 09/03/2018: ALT 7 09/04/2018: Magnesium 1.9 12/25/2018: B Natriuretic Peptide  984.0 12/26/2018: BUN 42; Creatinine, Ser 2.54; Hemoglobin 10.8; Platelets 281; Potassium 4.9; Sodium 143   Recent Lipid Panel No results found for: CHOL, TRIG, HDL, CHOLHDL, LDLCALC, LDLDIRECT  Wt Readings from Last 3 Encounters:  12/26/18 151 lb 3.8 oz (68.6 kg)  09/06/18 163 lb 9.3 oz (74.2 kg)  04/11/18 170 lb (77.1 kg)     Objective:    Vital Signs:   Today's Vitals   02/26/19 0824  BP: (!) 135/57  Pulse: 79  Weight: 176 lb (79.8 kg)  Height: 5\' 4"  (1.626 m)   Body mass index is 30.21 kg/m.  Normal affect. Normal speech pattern and tone. Comfortable, no apparent distress. No audible signs of SOB or wheezing  ASSESSMENT & PLAN:    1. SOB - chronic SOB/DOE due to her chronic COPD on home O2 and chronic diastolic HF - back to baseline after recent admission in 12/2018, continue to monitor  2. Chronic diastolic HF -  weights stable, no recent edema, breathing at baseline - ran out of torsemide last week, we will refill  3. HTN - at goal, meds decreased after recent admission   4. Hyperlipidemia - continue statin  5. CKD IV - continue to follow renal function while on diuretics    COVID-19 Education: The signs and symptoms of COVID-19 were discussed with the patient and how to seek care for testing (follow up with PCP or arrange E-visit).  The importance of social distancing was discussed today.  Time:   Today, I have spent 18 minutes with the patient with telehealth technology discussing the above problems.     Medication Adjustments/Labs and Tests Ordered: Current medicines are reviewed at length with the patient today.  Concerns regarding medicines are outlined above.   Tests Ordered: No orders of the defined types were placed in this encounter.   Medication Changes: No orders of the defined types were placed in this encounter.   Disposition:  Follow up 4 months  Signed, Carlyle Dolly, MD  02/26/2019 7:58 AM    [facility]

## 2019-02-26 NOTE — Addendum Note (Signed)
Addended byDebbora Lacrosse R on: 02/26/2019 10:04 AM   Modules accepted: Orders

## 2019-02-26 NOTE — Progress Notes (Signed)
Medication Instructions:   Your physician recommends that you continue on your current medications as directed. Please refer to the Current Medication list given to you today.  Labwork:  none  Testing/Procedures:  none  Follow-Up:  Your physician recommends that you schedule a follow-up appointment in: 4 months.   Any Other Special Instructions Will Be Listed Below (If Applicable).  If you need a refill on your cardiac medications before your next appointment, please call your pharmacy. 

## 2019-03-19 DIAGNOSIS — J441 Chronic obstructive pulmonary disease with (acute) exacerbation: Secondary | ICD-10-CM | POA: Diagnosis not present

## 2019-03-28 DIAGNOSIS — J449 Chronic obstructive pulmonary disease, unspecified: Secondary | ICD-10-CM | POA: Diagnosis not present

## 2019-04-01 DIAGNOSIS — I10 Essential (primary) hypertension: Secondary | ICD-10-CM | POA: Diagnosis not present

## 2019-04-01 DIAGNOSIS — E1122 Type 2 diabetes mellitus with diabetic chronic kidney disease: Secondary | ICD-10-CM | POA: Diagnosis not present

## 2019-04-01 DIAGNOSIS — Z0000 Encounter for general adult medical examination without abnormal findings: Secondary | ICD-10-CM | POA: Diagnosis not present

## 2019-04-01 DIAGNOSIS — J449 Chronic obstructive pulmonary disease, unspecified: Secondary | ICD-10-CM | POA: Diagnosis not present

## 2019-04-01 DIAGNOSIS — N183 Chronic kidney disease, stage 3 (moderate): Secondary | ICD-10-CM | POA: Diagnosis not present

## 2019-04-01 DIAGNOSIS — Z9981 Dependence on supplemental oxygen: Secondary | ICD-10-CM | POA: Diagnosis not present

## 2019-04-01 DIAGNOSIS — Z7189 Other specified counseling: Secondary | ICD-10-CM | POA: Diagnosis not present

## 2019-04-23 DIAGNOSIS — E119 Type 2 diabetes mellitus without complications: Secondary | ICD-10-CM | POA: Diagnosis not present

## 2019-04-23 DIAGNOSIS — I10 Essential (primary) hypertension: Secondary | ICD-10-CM | POA: Diagnosis not present

## 2019-04-23 DIAGNOSIS — E782 Mixed hyperlipidemia: Secondary | ICD-10-CM | POA: Diagnosis not present

## 2019-05-06 DIAGNOSIS — I10 Essential (primary) hypertension: Secondary | ICD-10-CM | POA: Diagnosis not present

## 2019-05-06 DIAGNOSIS — J449 Chronic obstructive pulmonary disease, unspecified: Secondary | ICD-10-CM | POA: Diagnosis not present

## 2019-05-06 DIAGNOSIS — N183 Chronic kidney disease, stage 3 (moderate): Secondary | ICD-10-CM | POA: Diagnosis not present

## 2019-05-06 DIAGNOSIS — E119 Type 2 diabetes mellitus without complications: Secondary | ICD-10-CM | POA: Diagnosis not present

## 2019-05-20 DIAGNOSIS — E875 Hyperkalemia: Secondary | ICD-10-CM | POA: Diagnosis not present

## 2019-05-21 ENCOUNTER — Encounter (HOSPITAL_COMMUNITY): Payer: Self-pay

## 2019-05-21 ENCOUNTER — Other Ambulatory Visit: Payer: Self-pay

## 2019-05-21 ENCOUNTER — Observation Stay (HOSPITAL_COMMUNITY): Payer: Medicare HMO

## 2019-05-21 ENCOUNTER — Observation Stay (HOSPITAL_COMMUNITY)
Admission: EM | Admit: 2019-05-21 | Discharge: 2019-05-23 | Disposition: A | Discharge status: Home / Self Care | Payer: Medicare HMO | Attending: Internal Medicine | Admitting: Internal Medicine

## 2019-05-21 DIAGNOSIS — I5033 Acute on chronic diastolic (congestive) heart failure: Secondary | ICD-10-CM | POA: Diagnosis not present

## 2019-05-21 DIAGNOSIS — N189 Chronic kidney disease, unspecified: Secondary | ICD-10-CM | POA: Insufficient documentation

## 2019-05-21 DIAGNOSIS — F17210 Nicotine dependence, cigarettes, uncomplicated: Secondary | ICD-10-CM | POA: Insufficient documentation

## 2019-05-21 DIAGNOSIS — E875 Hyperkalemia: Principal | ICD-10-CM | POA: Diagnosis present

## 2019-05-21 DIAGNOSIS — E1122 Type 2 diabetes mellitus with diabetic chronic kidney disease: Secondary | ICD-10-CM | POA: Insufficient documentation

## 2019-05-21 DIAGNOSIS — I130 Hypertensive heart and chronic kidney disease with heart failure and stage 1 through stage 4 chronic kidney disease, or unspecified chronic kidney disease: Secondary | ICD-10-CM | POA: Diagnosis not present

## 2019-05-21 DIAGNOSIS — Z20828 Contact with and (suspected) exposure to other viral communicable diseases: Secondary | ICD-10-CM | POA: Insufficient documentation

## 2019-05-21 DIAGNOSIS — N179 Acute kidney failure, unspecified: Secondary | ICD-10-CM | POA: Diagnosis not present

## 2019-05-21 DIAGNOSIS — Z03818 Encounter for observation for suspected exposure to other biological agents ruled out: Secondary | ICD-10-CM | POA: Diagnosis not present

## 2019-05-21 LAB — CBC WITH DIFFERENTIAL/PLATELET
Abs Immature Granulocytes: 0.01 K/uL (ref 0.00–0.07)
Basophils Absolute: 0 K/uL (ref 0.0–0.1)
Basophils Relative: 1 %
Eosinophils Absolute: 0.2 K/uL (ref 0.0–0.5)
Eosinophils Relative: 4 %
HCT: 42 % (ref 36.0–46.0)
Hemoglobin: 12.5 g/dL (ref 12.0–15.0)
Immature Granulocytes: 0 %
Lymphocytes Relative: 27 %
Lymphs Abs: 1.1 K/uL (ref 0.7–4.0)
MCH: 26.9 pg (ref 26.0–34.0)
MCHC: 29.8 g/dL — ABNORMAL LOW (ref 30.0–36.0)
MCV: 90.3 fL (ref 80.0–100.0)
Monocytes Absolute: 0.4 K/uL (ref 0.1–1.0)
Monocytes Relative: 10 %
Neutro Abs: 2.3 K/uL (ref 1.7–7.7)
Neutrophils Relative %: 58 %
Platelets: 218 K/uL (ref 150–400)
RBC: 4.65 MIL/uL (ref 3.87–5.11)
RDW: 15 % (ref 11.5–15.5)
WBC: 4 K/uL (ref 4.0–10.5)
nRBC: 0 % (ref 0.0–0.2)

## 2019-05-21 LAB — BASIC METABOLIC PANEL
Anion gap: 5 (ref 5–15)
BUN: 37 mg/dL — ABNORMAL HIGH (ref 8–23)
CO2: 22 mmol/L (ref 22–32)
Calcium: 8.3 mg/dL — ABNORMAL LOW (ref 8.9–10.3)
Chloride: 112 mmol/L — ABNORMAL HIGH (ref 98–111)
Creatinine, Ser: 3.27 mg/dL — ABNORMAL HIGH (ref 0.44–1.00)
GFR calc Af Amer: 16 mL/min — ABNORMAL LOW (ref >60)
GFR calc non Af Amer: 14 mL/min — ABNORMAL LOW (ref >60)
Glucose, Bld: 47 mg/dL — ABNORMAL LOW (ref 70–99)
Potassium: 5.8 mmol/L — ABNORMAL HIGH (ref 3.5–5.1)
Sodium: 139 mmol/L (ref 135–145)

## 2019-05-21 LAB — COMPREHENSIVE METABOLIC PANEL WITH GFR
ALT: 7 U/L (ref 0–44)
AST: 7 U/L — ABNORMAL LOW (ref 15–41)
Albumin: 3.9 g/dL (ref 3.5–5.0)
Alkaline Phosphatase: 60 U/L (ref 38–126)
Anion gap: 5 (ref 5–15)
BUN: 38 mg/dL — ABNORMAL HIGH (ref 8–23)
CO2: 21 mmol/L — ABNORMAL LOW (ref 22–32)
Calcium: 8.4 mg/dL — ABNORMAL LOW (ref 8.9–10.3)
Chloride: 114 mmol/L — ABNORMAL HIGH (ref 98–111)
Creatinine, Ser: 3.32 mg/dL — ABNORMAL HIGH (ref 0.44–1.00)
GFR calc Af Amer: 15 mL/min — ABNORMAL LOW (ref >60)
GFR calc non Af Amer: 13 mL/min — ABNORMAL LOW (ref >60)
Glucose, Bld: 108 mg/dL — ABNORMAL HIGH (ref 70–99)
Potassium: 6.2 mmol/L — ABNORMAL HIGH (ref 3.5–5.1)
Sodium: 140 mmol/L (ref 135–145)
Total Bilirubin: 0.3 mg/dL (ref 0.3–1.2)
Total Protein: 6.9 g/dL (ref 6.5–8.1)

## 2019-05-21 LAB — URINALYSIS, ROUTINE W REFLEX MICROSCOPIC
Bilirubin Urine: NEGATIVE
Glucose, UA: NEGATIVE mg/dL
Hgb urine dipstick: NEGATIVE
Ketones, ur: NEGATIVE mg/dL
Leukocytes,Ua: NEGATIVE
Nitrite: NEGATIVE
Protein, ur: NEGATIVE mg/dL
Specific Gravity, Urine: 1.011 (ref 1.005–1.030)
pH: 5 (ref 5.0–8.0)

## 2019-05-21 LAB — CBG MONITORING, ED: Glucose-Capillary: 84 mg/dL (ref 70–99)

## 2019-05-21 LAB — MAGNESIUM: Magnesium: 2 mg/dL (ref 1.7–2.4)

## 2019-05-21 LAB — GLUCOSE, CAPILLARY
Glucose-Capillary: 84 mg/dL (ref 70–99)
Glucose-Capillary: 86 mg/dL (ref 70–99)

## 2019-05-21 LAB — HEMOGLOBIN A1C
Hgb A1c MFr Bld: 4.2 % — ABNORMAL LOW (ref 4.8–5.6)
Mean Plasma Glucose: 73.84 mg/dL

## 2019-05-21 LAB — SODIUM, URINE, RANDOM: Sodium, Ur: 88 mmol/L

## 2019-05-21 LAB — SARS CORONAVIRUS 2 (TAT 6-24 HRS): SARS Coronavirus 2: NEGATIVE

## 2019-05-21 LAB — CREATININE, URINE, RANDOM: Creatinine, Urine: 130.39 mg/dL

## 2019-05-21 MED ORDER — HEPARIN SODIUM (PORCINE) 5000 UNIT/ML IJ SOLN
5000.0000 [IU] | Freq: Three times a day (TID) | INTRAMUSCULAR | Status: DC
  Administered 2019-05-21 – 2019-05-23 (×6): 5000 [IU] via SUBCUTANEOUS
  Filled 2019-05-21 (×7): qty 1

## 2019-05-21 MED ORDER — ALBUTEROL SULFATE HFA 108 (90 BASE) MCG/ACT IN AERS
6.0000 | INHALATION_SPRAY | Freq: Once | RESPIRATORY_TRACT | Status: AC
  Administered 2019-05-21: 6 via RESPIRATORY_TRACT
  Filled 2019-05-21: qty 6.7

## 2019-05-21 MED ORDER — INSULIN ASPART 100 UNIT/ML ~~LOC~~ SOLN: 0.0000 [IU] | Freq: Every day | SUBCUTANEOUS | Status: DC

## 2019-05-21 MED ORDER — DEXTROSE 50 % IV SOLN
1.0000 | Freq: Once | INTRAVENOUS | Status: AC
  Administered 2019-05-21: 13:00:00 50 mL via INTRAVENOUS

## 2019-05-21 MED ORDER — CLOPIDOGREL BISULFATE 75 MG PO TABS
75.0000 mg | ORAL_TABLET | Freq: Every day | ORAL | Status: DC
  Administered 2019-05-21 – 2019-05-23 (×3): 75 mg via ORAL
  Filled 2019-05-21 (×4): qty 1

## 2019-05-21 MED ORDER — DEXTROSE 50 % IV SOLN
INTRAVENOUS | Status: AC
  Filled 2019-05-21: qty 50

## 2019-05-21 MED ORDER — LACTATED RINGERS IV SOLN
INTRAVENOUS | Status: AC
  Administered 2019-05-21: 18:00:00 via INTRAVENOUS

## 2019-05-21 MED ORDER — SODIUM ZIRCONIUM CYCLOSILICATE 10 G PO PACK
10.0000 g | PACK | Freq: Two times a day (BID) | ORAL | Status: DC
  Administered 2019-05-21 (×2): 10 g via ORAL
  Filled 2019-05-21: qty 2
  Filled 2019-05-21: qty 1

## 2019-05-21 MED ORDER — ONDANSETRON HCL 4 MG PO TABS: 4.0000 mg | ORAL_TABLET | Freq: Four times a day (QID) | ORAL | Status: DC | PRN

## 2019-05-21 MED ORDER — ONDANSETRON HCL 4 MG/2ML IJ SOLN: 4.0000 mg | Freq: Four times a day (QID) | INTRAMUSCULAR | Status: DC | PRN

## 2019-05-21 MED ORDER — IPRATROPIUM-ALBUTEROL 0.5-2.5 (3) MG/3ML IN SOLN: 3.0000 mL | Freq: Four times a day (QID) | RESPIRATORY_TRACT | Status: DC | PRN

## 2019-05-21 MED ORDER — MOMETASONE FURO-FORMOTEROL FUM 200-5 MCG/ACT IN AERO
2.0000 | INHALATION_SPRAY | Freq: Two times a day (BID) | RESPIRATORY_TRACT | Status: DC
  Administered 2019-05-21 – 2019-05-23 (×4): 2 via RESPIRATORY_TRACT
  Filled 2019-05-21: qty 8.8

## 2019-05-21 MED ORDER — ACETAMINOPHEN 325 MG PO TABS: 650.0000 mg | ORAL_TABLET | Freq: Four times a day (QID) | ORAL | Status: DC | PRN

## 2019-05-21 MED ORDER — BUPROPION HCL ER (SR) 150 MG PO TB12
150.0000 mg | ORAL_TABLET | Freq: Two times a day (BID) | ORAL | Status: DC
  Administered 2019-05-21 – 2019-05-23 (×4): 150 mg via ORAL
  Filled 2019-05-21 (×4): qty 1

## 2019-05-21 MED ORDER — ASPIRIN EC 81 MG PO TBEC
162.0000 mg | DELAYED_RELEASE_TABLET | Freq: Every day | ORAL | Status: DC
  Administered 2019-05-21 – 2019-05-23 (×3): 162 mg via ORAL
  Filled 2019-05-21 (×3): qty 2

## 2019-05-21 MED ORDER — INSULIN ASPART 100 UNIT/ML IV SOLN
10.0000 [IU] | Freq: Once | INTRAVENOUS | Status: AC
  Administered 2019-05-21: 13:00:00 10 [IU] via INTRAVENOUS

## 2019-05-21 MED ORDER — SODIUM BICARBONATE 650 MG PO TABS
650.0000 mg | ORAL_TABLET | Freq: Two times a day (BID) | ORAL | Status: DC
  Administered 2019-05-21 – 2019-05-23 (×4): 650 mg via ORAL
  Filled 2019-05-21 (×9): qty 1

## 2019-05-21 MED ORDER — METOPROLOL TARTRATE 25 MG PO TABS
25.0000 mg | ORAL_TABLET | Freq: Every day | ORAL | Status: DC
  Administered 2019-05-21: 25 mg via ORAL
  Filled 2019-05-21: qty 1

## 2019-05-21 MED ORDER — LINACLOTIDE 145 MCG PO CAPS: 145.0000 ug | ORAL_CAPSULE | Freq: Every day | ORAL | Status: DC | PRN

## 2019-05-21 MED ORDER — FERROUS SULFATE 325 (65 FE) MG PO TABS
325.0000 mg | ORAL_TABLET | Freq: Two times a day (BID) | ORAL | Status: DC
  Administered 2019-05-21 – 2019-05-23 (×4): 325 mg via ORAL
  Filled 2019-05-21 (×4): qty 1

## 2019-05-21 MED ORDER — ACETAMINOPHEN 650 MG RE SUPP: 650.0000 mg | Freq: Four times a day (QID) | RECTAL | Status: DC | PRN

## 2019-05-21 MED ORDER — PANTOPRAZOLE SODIUM 40 MG PO TBEC
40.0000 mg | DELAYED_RELEASE_TABLET | Freq: Every day | ORAL | Status: DC
  Administered 2019-05-21 – 2019-05-23 (×3): 40 mg via ORAL
  Filled 2019-05-21 (×4): qty 1

## 2019-05-21 MED ORDER — OMEGA-3-ACID ETHYL ESTERS 1 G PO CAPS
1.0000 g | ORAL_CAPSULE | Freq: Every day | ORAL | Status: DC
  Administered 2019-05-21 – 2019-05-23 (×3): 1 g via ORAL
  Filled 2019-05-21 (×3): qty 1

## 2019-05-21 MED ORDER — INSULIN ASPART 100 UNIT/ML ~~LOC~~ SOLN: 0.0000 [IU] | Freq: Three times a day (TID) | SUBCUTANEOUS | Status: DC

## 2019-05-21 MED ORDER — SODIUM CHLORIDE 0.9 % IV BOLUS
250.0000 mL | Freq: Once | INTRAVENOUS | Status: AC
  Administered 2019-05-21: 250 mL via INTRAVENOUS

## 2019-05-21 MED ORDER — ATORVASTATIN CALCIUM 40 MG PO TABS
80.0000 mg | ORAL_TABLET | Freq: Every day | ORAL | Status: DC
  Administered 2019-05-21 – 2019-05-23 (×3): 80 mg via ORAL
  Filled 2019-05-21 (×4): qty 2

## 2019-05-21 MED ORDER — HYDRALAZINE HCL 20 MG/ML IJ SOLN: 10.0000 mg | INTRAMUSCULAR | Status: DC | PRN

## 2019-05-21 MED ORDER — FAMOTIDINE 20 MG PO TABS
20.0000 mg | ORAL_TABLET | Freq: Every day | ORAL | Status: DC
  Administered 2019-05-21 – 2019-05-23 (×3): 20 mg via ORAL
  Filled 2019-05-21 (×3): qty 1

## 2019-05-21 NOTE — H&P (Signed)
History and Physical    Erica [name] [id number] DOB: 07-05-1951 DOA: 05/21/2019  PCP: Iona Beard, MD   Patient coming from: Home  Chief Complaint: Hyperkalemia  HPI: Erica [name] is a 68 y.o. female with medical history significant for COPD with chronic hypoxemia on home 3L nasal cannula oxygen, diastolic CHF with LVEF [id number] on echo 08/2018, type 2 diabetes, dyslipidemia, hypertension, GERD, prior CVA, tobacco abuse, and CKD stage III-4 who presented to the ED after being called by her PCP today with noted potassium value of 7.1.  Her labs were actually drawn 2 days ago.  She states that she has been having decreased appetite over the last 1 week and has not been eating very much.  She denies any nausea, vomiting, diarrhea and has been continuing to take her home medications to include potassium supplementation as previously prescribed.  She denies any shortness of breath, cough, fevers, or chills.  No new medications are otherwise noted.  It is unclear, if she has previously been referred to nephrology for follow-up regarding her advanced CKD and seems to be unaware of the problem.  Her labs were part of a routine physical 2 days ago.   ED Course: Stable vital signs are noted and patient is afebrile.  COVID testing is currently pending.  Potassium is 6.2 with BUN 38 and creatinine 3.32.  Bicarbonate is 21.  EKG with no acute findings in sinus rhythm at 85 bpm noted.  EDP Dr. Laverta Baltimore had spoken with Dr. Marval Regal who has agreed with the plan to observe in the hospital after administration of D50 with insulin, Lokelma, and albuterol treatment.  Review of Systems: All others reviewed and otherwise negative.  Past Medical History:  Diagnosis Date  . Arthritis   . CHF (congestive heart failure) ([place])   . Chronic back pain   . COPD (chronic obstructive pulmonary disease) (HCC)    2L home O2  . Diabetes mellitus without complication ([place])   . Hyperlipidemia   . Hypertension   . Stroke  (cerebrum) [facility])     Past Surgical History:  Procedure Laterality Date  . ABDOMINAL HYSTERECTOMY    . colonoscopy  2009   Dr. Gala Romney: cluster of diminutive rectal polyps and 2 diminutive polyps in rectosigmoid junction, hyperplastic  . COLONOSCOPY WITH PROPOFOL N/A 10/18/2017   Procedure: COLONOSCOPY WITH PROPOFOL;  Surgeon: Daneil Dolin, MD;  [facility];  Service: Endoscopy;  Laterality: N/A;  9:30am  . ESOPHAGOGASTRODUODENOSCOPY N/A 01/25/2018   Procedure: ESOPHAGOGASTRODUODENOSCOPY (EGD);  Surgeon: Danie Binder, MD;  Location[facility];  Service: Endoscopy;  Laterality: N/A;  . ESOPHAGOGASTRODUODENOSCOPY (EGD) WITH PROPOFOL N/A 08/29/2017   Dr. Gala Romney with propofol: small hiatal hernia. 4cm fundal diverticulum.   Marland Kitchen GIVENS CAPSULE STUDY N/A 01/25/2018   Procedure: GIVENS CAPSULE STUDY;  Surgeon: Danie Binder, MD;  [facility]: [facility];  Service: Endoscopy;  Laterality: N/A;  . left hand middle finger     in an accident at work, amputated     reports that she has been smoking cigarettes. She has a 52.00 pack-year smoking history. She has never used smokeless tobacco. She reports that she does not drink alcohol or use drugs.  No Known Allergies  Family History  Problem Relation Age of Onset  . CVA Mother 41  . Cancer Neg Hx   . Colon cancer Neg Hx     Prior to Admission medications   Medication Sig Start Date End Date Taking? Authorizing Provider  aspirin EC 81 MG tablet Take 162 mg by mouth daily.   Yes [provider]  atorvastatin (LIPITOR) 80 MG tablet Take 80 mg by mouth daily.  12/04/16  Yes [provider]  budesonide-formoterol (SYMBICORT) 160-4.5 MCG/ACT inhaler Inhale 2 puffs into the lungs 2 (two) times daily. 11/23/15  Yes Barton Dubois, MD  buPROPion [facility] SR) 150 MG 12 hr tablet Take 150 mg by mouth 2 (two) times daily. 12/04/16  Yes [provider]  clopidogrel (PLAVIX) 75 MG tablet Take 75 mg by mouth daily.   03/29/18  Yes [provider]  famotidine (PEPCID) 20 MG tablet Take 20 mg by mouth daily.  03/29/18  Yes [provider]  ferrous sulfate 325 (65 FE) MG EC tablet Take 1 tablet (325 mg total) by mouth 2 (two) times daily. 01/25/18 05/21/19 Yes Erline Hau, MD  ipratropium-albuterol (DUONEB) 0.5-2.5 (3) MG/3ML SOLN Take 3 mLs by nebulization every 6 (six) hours as needed (for shortness of breath).   Yes [provider]  Linaclotide (LINZESS) 145 MCG CAPS capsule Take 145 mcg by mouth daily as needed (for constipation).    Yes [provider]  metoprolol tartrate (LOPRESSOR) 25 MG tablet Take 0.5 tablets (12.5 mg total) by mouth 2 (two) times daily. Patient taking differently: Take 25 mg by mouth daily.  08/30/17  Yes Tat, Shanon Brow, MD  omega-3 acid ethyl esters (LOVAZA) 1 g capsule Take 1 g by mouth daily.  12/07/16  Yes [provider]  pantoprazole (PROTONIX) 40 MG tablet Take 1 tablet (40 mg total) by mouth daily. 01/25/18  Yes Isaac Bliss, Rayford Halsted, MD  potassium chloride SA (K-DUR,KLOR-CON) 20 MEQ tablet Take 20 mEq by mouth 2 (two) times daily.   Yes [provider]  saxagliptin HCl (ONGLYZA) 5 MG TABS tablet Take 5 mg by mouth daily. Reported on 09/22/2015   Yes [provider]  SPIRIVA HANDIHALER 18 MCG inhalation capsule Take 1 puff by mouth daily. 10/31/17  Yes [provider]  torsemide (DEMADEX) 20 MG tablet Take 1 tablet (20 mg total) by mouth daily. Take 1 tablet on Monday, Wednesday, and Friday only. 02/26/19  Yes BranchAlphonse Guild, MD  valsartan-hydrochlorothiazide (DIOVAN-HCT) 160-25 MG tablet Take 1 tablet by mouth daily.   Yes [provider]    Physical Exam: Vitals:   05/21/19 1132 05/21/19 1200 05/21/19 1230 05/21/19 1317  BP:  (!) 131/52 (!) 118/51 (!) 135/44  Pulse:  81 83 85  Resp:  (!) 23 16 20   Temp:      TempSrc:      SpO2: 95% 95% 95% 99%  Weight:      Height:         Constitutional: NAD, calm, comfortable Vitals:   05/21/19 1132 05/21/19 1200 05/21/19 1230 05/21/19 1317  BP:  (!) 131/52 (!) 118/51 (!) 135/44  Pulse:  81 83 85  Resp:  (!) 23 16 20   Temp:      TempSrc:      SpO2: 95% 95% 95% 99%  Weight:      Height:       Eyes: lids and conjunctivae normal ENMT: Mucous membranes are moist.  Neck: normal, supple Respiratory: clear to auscultation bilaterally. Normal respiratory effort. No accessory muscle use.  Currently on 3 L nasal cannula. Cardiovascular: Regular rate and rhythm, no murmurs. No extremity edema. Abdomen: no tenderness, no distention. Bowel sounds positive.  Musculoskeletal:  No joint deformity upper and lower extremities.  Skin: no rashes, lesions, ulcers.  Psychiatric: Normal judgment and insight. Alert and oriented x 3. Normal mood.   Labs on Admission: I have personally reviewed following labs and imaging studies  CBC: Recent Labs  Lab 05/21/19 1135  WBC 4.0  NEUTROABS 2.3  HGB 12.5  HCT 42.0  MCV 90.3  PLT 99991111   Basic Metabolic Panel: Recent Labs  Lab 05/21/19 1135  NA 140  K 6.2*  CL 114*  CO2 21*  GLUCOSE 108*  BUN 38*  CREATININE 3.32*  CALCIUM 8.4*  MG 2.0   GFR: Estimated Creatinine Clearance: 15.3 mL/min (A) (by C-G formula based on SCr of 3.32 mg/dL (H)). Liver Function Tests: Recent Labs  Lab 05/21/19 1135  AST 7*  ALT 7  ALKPHOS 60  BILITOT 0.3  PROT 6.9  ALBUMIN 3.9   No results for input(s): LIPASE, AMYLASE in the last 168 hours. No results for input(s): AMMONIA in the last 168 hours. Coagulation Profile: No results for input(s): INR, PROTIME in the last 168 hours. Cardiac Enzymes: No results for input(s): CKTOTAL, CKMB, CKMBINDEX, TROPONINI in the last 168 hours. BNP (last 3 results) No results for input(s): PROBNP in the last 8760 hours. HbA1C: No results for input(s): HGBA1C in the last 72 hours. CBG: Recent Labs  Lab 05/21/19 1309  GLUCAP 84   Lipid Profile: No  results for input(s): CHOL, HDL, LDLCALC, TRIG, CHOLHDL, LDLDIRECT in the last 72 hours. Thyroid Function Tests: No results for input(s): TSH, T4TOTAL, FREET4, T3FREE, THYROIDAB in the last 72 hours. Anemia Panel: No results for input(s): VITAMINB12, FOLATE, FERRITIN, TIBC, IRON, RETICCTPCT in the last 72 hours. Urine analysis:    Component Value Date/Time   COLORURINE YELLOW 09/05/2018 1210   APPEARANCEUR CLEAR 09/05/2018 1210   LABSPEC 1.015 09/05/2018 1210   PHURINE 5.0 09/05/2018 1210   GLUCOSEU NEGATIVE 09/05/2018 1210   HGBUR NEGATIVE 09/05/2018 1210   BILIRUBINUR NEGATIVE 09/05/2018 1210   KETONESUR NEGATIVE 09/05/2018 1210   PROTEINUR NEGATIVE 09/05/2018 1210   UROBILINOGEN 0.2 03/14/2013 1847   NITRITE POSITIVE (A) 09/05/2018 1210   LEUKOCYTESUR NEGATIVE 09/05/2018 1210    Radiological Exams on Admission: No results found.  EKG: Independently reviewed. SR 85bpm with chronic T wave changes.  Assessment/Plan Active Problems:   Acute hyperkalemia    Acute hyperkalemia -Likely secondary to ongoing supplementation with potassium in the face of AKI versus advancing CKD -Hold home potassium supplementation -Monitor repeat labs -Monitor on telemetry -Patient given D50/insulin as well as Lokelma and albuterol treatment  AKI on CKD stage III versus advancing CKD -Likely prerenal due to recent poor appetite and intake -Appreciate nephrology consultation -Maintain on IV fluid with LR  -Monitor I's and O's -Avoid nephrotoxic agents -Repeat renal panel in a.m.  Metabolic acidosis likely related to above -We will plan to give sodium bicarbonate tablets -Repeat labs in a.m.  Chronic diastolic CHF -Appears to be euvolemic -Monitor daily weights in the face of gentle IV fluids  Type 2 diabetes -Currently without significant hyperglycemia -Hold home agents and maintain on SSI -Carb modified diet  Hypertension -Maintain on home metoprolol -Hold Diovan HCT  -Hydralazine as needed  Dyslipidemia -Maintain on home statin  GERD -Maintain on PPI and Pepcid  COPD with chronic hypoxemia -Maintain on home Symbicort -We will administer breathing treatments as needed for shortness of breath or wheezing   DVT prophylaxis: Heparin Code Status: Full Family Communication: None at bedside Disposition Plan:Observe for treatment of hyperkalemia/aki Consults called:Nephrology by EDP Admission status:  Obs, tele   Rodena Goldmann DO [facility] Pager [phone]  If 7PM-7AM, please contact night-coverage [email or web address] Password [email or web address]  05/21/2019, 1:55 PM

## 2019-05-21 NOTE — ED Notes (Signed)
Pt denies need to use the bathroom

## 2019-05-21 NOTE — ED Notes (Signed)
ED Provider at bedside. 

## 2019-05-21 NOTE — ED Triage Notes (Signed)
Pt sent from [facility] due to potassium of 7.1  Pt denies any complaints

## 2019-05-21 NOTE — ED Provider Notes (Signed)
Emergency Department Provider Note   I have reviewed the triage vital signs and the nursing notes.   HISTORY  Chief Complaint Abnormal Lab   HPI Erica [name] is a 68 y.o. female with PMH of COPD on home [place], CHF, HLD, HTN, DM, and CKD presents to the emergency department for evaluation of elevated potassium on outpatient lab work.  Patient states blood was drawn 1 to 2 days ago by her PCP.  She was called today saying that her potassium was 7.1 and that she needed to her present to the emergency department immediately, which she did.  She reports feeling like her normal self in the past few days.  She has not noticed a change in urine output.  No lower abdominal or back/flank pain.  Denies fevers or chills.  No new medications.  Denies shortness of breath.  She tells me that she does have CKD and has been referred to nephrology but has not followed with them as of yet.  Patient tells me that the lab work performed 1 to 2 days ago was a part of a routine physical.   Past Medical History:  Diagnosis Date  . Arthritis   . CHF (congestive heart failure) ([place])   . Chronic back pain   . COPD (chronic obstructive pulmonary disease) (HCC)    2L home O2  . Diabetes mellitus without complication ([place])   . Hyperlipidemia   . Hypertension   . Stroke (cerebrum) [facility])     Patient Active Problem List   Diagnosis Date Noted  . Acute hyperkalemia 05/21/2019  . Acute hypoxemic respiratory failure ([place]) 09/03/2018  . Acute diastolic (congestive) heart failure ([name]) 03/09/2018  . AVM (arteriovenous malformation) of duodenum, acquired   . GIB (gastrointestinal bleeding) 01/23/2018  . CKD (chronic kidney disease) 01/23/2018  . Hyperkalemia 12/21/2017  . Hyperlipidemia 12/18/2017  . Hypomagnesemia 12/17/2017  . Acute renal failure superimposed on stage 3 chronic kidney disease ([place]) 12/07/2017  . Acute on chronic respiratory failure with hypoxia ([place]) 12/07/2017  . COPD with acute  exacerbation ([place]) 12/07/2017  . Acute diastolic CHF (congestive heart failure) ([place]) 09/19/2017  . Iron deficiency anemia due to chronic blood loss   . Acute diverticulitis 08/29/2017  . Heme positive stool   . Symptomatic anemia   . GI bleed 08/27/2017  . Elevated troponin 08/27/2017  . (HFpEF) heart failure with preserved ejection fraction ([place]) 08/27/2017  . Acute dyspnea   . Acute on chronic respiratory failure ([place]) 02/11/2017  . Acute on chronic diastolic CHF (congestive heart failure) ([place]) 02/11/2017  . Benign essential HTN 11/20/2015  . GERD (gastroesophageal reflux disease) 11/20/2015  . Tobacco abuse 11/20/2015  . Type 2 diabetes mellitus with hyperglycemia ([place]) 11/20/2015  . COPD exacerbation ([place]) 09/05/2015  . Chronic pain syndrome 09/05/2015  . Hypertension 09/05/2015  . Diabetes mellitus type 2, controlled ([place]) 09/05/2015  . Chronic respiratory failure ([place]) 09/05/2015  . OLECRANON BURSITIS, LEFT 08/04/2010    Past Surgical History:  Procedure Laterality Date  . ABDOMINAL HYSTERECTOMY    . colonoscopy  2009   Dr. Gala Romney: cluster of diminutive rectal polyps and 2 diminutive polyps in rectosigmoid junction, hyperplastic  . COLONOSCOPY WITH PROPOFOL N/A 10/18/2017   Procedure: COLONOSCOPY WITH PROPOFOL;  Surgeon: Daneil Dolin, MD;  [facility];  Service: Endoscopy;  Laterality: N/A;  9:30am  . ESOPHAGOGASTRODUODENOSCOPY N/A 01/25/2018   Procedure: ESOPHAGOGASTRODUODENOSCOPY (EGD);  Surgeon: Danie Binder, MD;  [facility];  Service:  Endoscopy;  Laterality: N/A;  . ESOPHAGOGASTRODUODENOSCOPY (EGD) WITH PROPOFOL N/A 08/29/2017   Dr. Gala Romney with propofol: small hiatal hernia. 4cm fundal diverticulum.   Marland Kitchen GIVENS CAPSULE STUDY N/A 01/25/2018   Procedure: GIVENS CAPSULE STUDY;  Surgeon: Danie Binder, MD;  [facility];  Service: Endoscopy;  Laterality: N/A;  . left hand middle finger     in an accident at work, amputated     Allergies Patient has no known allergies.  Family History  Problem Relation Age of Onset  . CVA Mother 32  . Cancer Neg Hx   . Colon cancer Neg Hx     Social History Social History   Tobacco Use  . Smoking status: Current Every Day Smoker    Packs/day: 1.00    Years: 52.00    Pack years: 52.00    Types: Cigarettes    Last attempt to quit: 09/23/2017    Years since quitting: 1.6  . Smokeless tobacco: Never Used  Substance Use Topics  . Alcohol use: No  . Drug use: No    Review of Systems  Constitutional: No fever/chills Eyes: No visual changes. ENT: No sore throat. Cardiovascular: Denies chest pain. Respiratory: Denies shortness of breath. Gastrointestinal: No abdominal pain.  No nausea, no vomiting.  No diarrhea.  No constipation. Genitourinary: Negative for dysuria. Musculoskeletal: Negative for back pain. Skin: Negative for rash. Neurological: Negative for headaches, focal weakness or numbness.  10-point ROS otherwise negative.  ____________________________________________   PHYSICAL EXAM:  VITAL SIGNS: ED Triage Vitals  Enc Vitals Group     BP 05/21/19 1109 (!) 128/56     Pulse Rate 05/21/19 1109 86     Resp 05/21/19 1109 20     Temp 05/21/19 1109 98.5 F (36.9 C)     Temp Source 05/21/19 1109 Oral     SpO2 05/21/19 1109 92 %     Weight 05/21/19 1112 157 lb (71.2 kg)     Height 05/21/19 1112 5\' 4"  (1.626 m)   Constitutional: Alert and oriented. Well appearing and in no acute distress. Eyes: Conjunctivae are normal. Head: Atraumatic. Nose: No congestion/rhinnorhea. Mouth/Throat: Mucous membranes are moist.  O2 in place on home settings.  Neck: No stridor.  Cardiovascular: Normal rate, regular rhythm. Good peripheral circulation. Grossly normal heart sounds.   Respiratory: Normal respiratory effort.  No retractions. Lungs CTAB. Gastrointestinal: Soft and nontender. No distention.  Musculoskeletal: No lower extremity tenderness nor edema. No  gross deformities of extremities. Neurologic:  Normal speech and language.  Skin:  Skin is warm, dry and intact. No rash noted.  ____________________________________________   LABS (all labs ordered are listed, but only abnormal results are displayed)  Labs Reviewed  COMPREHENSIVE METABOLIC PANEL - Abnormal; Notable for the following components:      Result Value   Potassium 6.2 (*)    Chloride 114 (*)    CO2 21 (*)    Glucose, Bld 108 (*)    BUN 38 (*)    Creatinine, Ser 3.32 (*)    Calcium 8.4 (*)    AST 7 (*)    GFR calc non Af Amer 13 (*)    GFR calc Af Amer 15 (*)    All other components within normal limits  CBC WITH DIFFERENTIAL/PLATELET - Abnormal; Notable for the following components:   MCHC 29.8 (*)    All other components within normal limits  SARS CORONAVIRUS 2 (TAT 6-12 HRS)  MAGNESIUM  URINALYSIS, ROUTINE W REFLEX MICROSCOPIC  BASIC  METABOLIC PANEL  CBG MONITORING, ED  CBG MONITORING, ED   ____________________________________________  EKG   EKG Interpretation  Date/Time:  Wednesday May 21 2019 11:22:28 EDT Ventricular Rate:  85 PR Interval:    QRS Duration: 99 QT Interval:  355 QTC Calculation: 423 R Axis:   79 Text Interpretation:  Sinus rhythm Biatrial enlargement Borderline repolarization abnormality T wave changes similar to prior.  No STEMI  Confirmed by Nanda Quinton [phone]) on 05/21/2019 12:51:11 PM       ____________________________________________  RADIOLOGY  None  ____________________________________________   PROCEDURES  Procedure(s) performed:   Procedures  CRITICAL CARE Performed by: Margette Fast Total critical care time: 35 minutes Critical care time was exclusive of separately billable procedures and treating other patients. Critical care was necessary to treat or prevent imminent or life-threatening deterioration. Critical care was time spent personally by me on the following activities: development of treatment  plan with patient and/or surrogate as well as nursing, discussions with consultants, evaluation of patient's response to treatment, examination of patient, obtaining history from patient or surrogate, ordering and performing treatments and interventions, ordering and review of laboratory studies, ordering and review of radiographic studies, pulse oximetry and re-evaluation of patient's condition.  Nanda Quinton, MD Emergency Medicine  ____________________________________________   INITIAL IMPRESSION / ASSESSMENT AND PLAN / ED COURSE  Pertinent labs & imaging results that were available during my care of the patient were reviewed by me and considered in my medical decision making (see chart for details).   Patient presents to the emergency department for evaluation of hyperkalemia on outpatient lab work.  No paperwork was brought with the patient and I cannot see the lab results in epic.  Plan for repeat labs here along with UA.  Prior creatinine was 2.5 in April 2020.   12:57 PM  Spoke with Dr. Marval Regal with Nephrology. Agrees with plan for lokelmia and monitoring on tele. Will follow labs and he will consult in the AM when rounding at [facility].   Discussed patient's case with [facility] to request admission. Patient and family (if present) updated with plan. Care transferred to [facility] service.  I reviewed all nursing notes, vitals, pertinent old records, EKGs, labs, imaging (as available).  ____________________________________________  FINAL CLINICAL IMPRESSION(S) / ED DIAGNOSES  Final diagnoses:  Hyperkalemia  AKI (acute kidney injury) ([place])    MEDICATIONS GIVEN DURING THIS VISIT:  Medications  sodium zirconium cyclosilicate (LOKELMA) packet 10 g (10 g Oral Given 05/21/19 1310)  albuterol (VENTOLIN HFA) 108 (90 Base) MCG/ACT inhaler 6 puff (6 puffs Inhalation Given 05/21/19 1254)  insulin aspart (novoLOG) injection 10 Units (10 Units Intravenous Given 05/21/19 1310)  dextrose 50 % solution  50 mL (50 mLs Intravenous Given 05/21/19 1310)  sodium chloride 0.9 % bolus 250 mL (250 mLs Intravenous New Bag/Given 05/21/19 1311)    Note:  This document was prepared using Dragon voice recognition software and may include unintentional dictation errors.  Nanda Quinton, MD Emergency Medicine    Lexie Morini, [facility], MD 05/21/19 [phone]

## 2019-05-22 DIAGNOSIS — I5030 Unspecified diastolic (congestive) heart failure: Secondary | ICD-10-CM | POA: Diagnosis not present

## 2019-05-22 DIAGNOSIS — E875 Hyperkalemia: Secondary | ICD-10-CM | POA: Diagnosis not present

## 2019-05-22 DIAGNOSIS — E1122 Type 2 diabetes mellitus with diabetic chronic kidney disease: Secondary | ICD-10-CM | POA: Diagnosis not present

## 2019-05-22 DIAGNOSIS — I130 Hypertensive heart and chronic kidney disease with heart failure and stage 1 through stage 4 chronic kidney disease, or unspecified chronic kidney disease: Secondary | ICD-10-CM | POA: Diagnosis not present

## 2019-05-22 DIAGNOSIS — N179 Acute kidney failure, unspecified: Secondary | ICD-10-CM | POA: Diagnosis not present

## 2019-05-22 LAB — CBC
HCT: 39.5 % (ref 36.0–46.0)
Hemoglobin: 11.6 g/dL — ABNORMAL LOW (ref 12.0–15.0)
MCH: 26.7 pg (ref 26.0–34.0)
MCHC: 29.4 g/dL — ABNORMAL LOW (ref 30.0–36.0)
MCV: 90.8 fL (ref 80.0–100.0)
Platelets: 196 10*3/uL (ref 150–400)
RBC: 4.35 MIL/uL (ref 3.87–5.11)
RDW: 14.7 % (ref 11.5–15.5)
WBC: 4.8 10*3/uL (ref 4.0–10.5)
nRBC: 0.6 % — ABNORMAL HIGH (ref 0.0–0.2)

## 2019-05-22 LAB — BASIC METABOLIC PANEL
Anion gap: 6 (ref 5–15)
BUN: 36 mg/dL — ABNORMAL HIGH (ref 8–23)
CO2: 22 mmol/L (ref 22–32)
Calcium: 8.1 mg/dL — ABNORMAL LOW (ref 8.9–10.3)
Chloride: 113 mmol/L — ABNORMAL HIGH (ref 98–111)
Creatinine, Ser: 3.09 mg/dL — ABNORMAL HIGH (ref 0.44–1.00)
GFR calc Af Amer: 17 mL/min — ABNORMAL LOW (ref >60)
GFR calc non Af Amer: 15 mL/min — ABNORMAL LOW (ref >60)
Glucose, Bld: 92 mg/dL (ref 70–99)
Potassium: 6 mmol/L — ABNORMAL HIGH (ref 3.5–5.1)
Sodium: 141 mmol/L (ref 135–145)

## 2019-05-22 LAB — RENAL FUNCTION PANEL
Albumin: 3.4 g/dL — ABNORMAL LOW (ref 3.5–5.0)
Anion gap: 8 (ref 5–15)
BUN: 37 mg/dL — ABNORMAL HIGH (ref 8–23)
CO2: 18 mmol/L — ABNORMAL LOW (ref 22–32)
Calcium: 8 mg/dL — ABNORMAL LOW (ref 8.9–10.3)
Chloride: 115 mmol/L — ABNORMAL HIGH (ref 98–111)
Creatinine, Ser: 3.21 mg/dL — ABNORMAL HIGH (ref 0.44–1.00)
GFR calc Af Amer: 16 mL/min — ABNORMAL LOW (ref >60)
GFR calc non Af Amer: 14 mL/min — ABNORMAL LOW (ref >60)
Glucose, Bld: 86 mg/dL (ref 70–99)
Phosphorus: 6.1 mg/dL — ABNORMAL HIGH (ref 2.5–4.6)
Potassium: 6.1 mmol/L — ABNORMAL HIGH (ref 3.5–5.1)
Sodium: 141 mmol/L (ref 135–145)

## 2019-05-22 LAB — GLUCOSE, CAPILLARY
Glucose-Capillary: 98 mg/dL (ref 70–99)
Glucose-Capillary: 93 mg/dL (ref 70–99)
Glucose-Capillary: 99 mg/dL (ref 70–99)
Glucose-Capillary: 123 mg/dL — ABNORMAL HIGH (ref 70–99)

## 2019-05-22 LAB — KAPPA/LAMBDA LIGHT CHAINS
Kappa free light chain: 43.4 mg/L — ABNORMAL HIGH (ref 3.3–19.4)
Kappa, lambda light chain ratio: 1.9 — ABNORMAL HIGH (ref 0.26–1.65)
Lambda free light chains: 22.9 mg/L (ref 5.7–26.3)

## 2019-05-22 LAB — HIV ANTIBODY (ROUTINE TESTING W REFLEX): HIV Screen 4th Generation wRfx: NONREACTIVE

## 2019-05-22 LAB — MAGNESIUM: Magnesium: 2.1 mg/dL (ref 1.7–2.4)

## 2019-05-22 MED ORDER — FUROSEMIDE 10 MG/ML IJ SOLN
40.0000 mg | Freq: Two times a day (BID) | INTRAMUSCULAR | Status: DC
  Administered 2019-05-22 – 2019-05-23 (×3): 40 mg via INTRAVENOUS
  Filled 2019-05-22 (×5): qty 4

## 2019-05-22 MED ORDER — GUAIFENESIN-DM 100-10 MG/5ML PO SYRP
5.0000 mL | ORAL_SOLUTION | ORAL | Status: DC | PRN
  Administered 2019-05-22: 5 mL via ORAL
  Filled 2019-05-22: qty 5

## 2019-05-22 MED ORDER — SALINE SPRAY 0.65 % NA SOLN: 1.0000 | NASAL | Status: DC | PRN

## 2019-05-22 MED ORDER — SODIUM POLYSTYRENE SULFONATE 15 GM/60ML PO SUSP
30.0000 g | Freq: Once | ORAL | Status: AC
  Administered 2019-05-22: 30 g via ORAL
  Filled 2019-05-22: qty 120

## 2019-05-22 NOTE — Progress Notes (Signed)
Slept most of shift but  easily  arousable/alert. Did not eat breakfast or lunch and encouraged to drink and  eat dinner: stated 'not hungry'. No c/o pain or discomfort. No distress.

## 2019-05-22 NOTE — Consult Note (Signed)
Reason for Consult: AKI/CKD stage 4 Referring Physician:  Manuella Ghazi, MD  Erica [name] is an 68 y.o. female.  HPI: Erica [name] has a PMH significant for longstanding HTN, tobacco abuse, COPD on chronic oxygen, DM, diastolic CHF h/o CVA, HLD, and CKD stage 4 who presented to [facility] ED after her PCP called her to go for a potassium of 7.1.  She denies any symptoms prior to admission other than a decreased appetite for a week.  In the ED she was found to have a K of 6.2 without EKG changes, BUN/Cr of 38/3.32, CO2 21 and was admitted for management of her hyperkalemia.  We were consulted to further evaluate and manage her AKI/CKD and electrolyte abnormalities.  She has had progressive CKD for the past 3 years and the trend in Scr is seen below.  She denies any knowledge of the chronicity of severity of her CKD, however she was referred to Nephrology in December of 2019 at time of discharge for acute on chronic CHF with rising Scr.    She admits to taking ibuprofen on a regular basis.  She denies any family history of kidney disease.  She denies any dysuria, pyuria, hematuria, urgency, frequency, or retention.  Renal US revealed an atrophic right kidney at 4.2 cm and left kidney 11.3cm with increased echogenicity consistent with chronic medical renal disease.  Of note, she has been taking an ARB and potassium supplements prior to admission.  She was treated with lokelma, insulin/D50, bicarb with only minimal improvement of hyperkalemia.  Trend in Creatinine: Creatinine, Ser  Date/Time Value Ref Range Status  05/22/2019 07:17 AM 3.09 (H) 0.44 - 1.00 mg/dL Final  05/21/2019 03:23 PM 3.27 (H) 0.44 - 1.00 mg/dL Final  05/21/2019 11:35 AM 3.32 (H) 0.44 - 1.00 mg/dL Final  12/26/2018 04:49 AM 2.54 (H) 0.44 - 1.00 mg/dL Final  12/25/2018 06:52 PM 2.47 (H) 0.44 - 1.00 mg/dL Final  09/06/2018 07:44 AM 2.40 (H) 0.44 - 1.00 mg/dL Final  09/05/2018 04:32 AM 2.83 (H) 0.44 - 1.00 mg/dL Final  09/04/2018 03:53 AM 2.42 (H)  0.44 - 1.00 mg/dL Final  09/03/2018 11:21 AM 1.72 (H) 0.44 - 1.00 mg/dL Final  04/11/2018 06:39 PM 4.44 (H) 0.44 - 1.00 mg/dL Final  03/10/2018 06:00 AM 1.52 (H) 0.44 - 1.00 mg/dL Final  03/09/2018 01:48 PM 1.30 (H) 0.44 - 1.00 mg/dL Final  01/24/2018 04:50 AM 1.29 (H) 0.44 - 1.00 mg/dL Final  01/23/2018 10:52 AM 1.71 (H) 0.44 - 1.00 mg/dL Final  12/21/2017 06:04 AM 1.57 (H) 0.44 - 1.00 mg/dL Final  12/20/2017 04:50 AM 1.94 (H) 0.44 - 1.00 mg/dL Final  12/19/2017 04:21 AM 2.17 (H) 0.44 - 1.00 mg/dL Final  12/18/2017 09:24 AM 1.85 (H) 0.44 - 1.00 mg/dL Final  12/17/2017 05:50 PM 1.60 (H) 0.44 - 1.00 mg/dL Final  12/17/2017 05:39 PM 1.65 (H) 0.44 - 1.00 mg/dL Final  12/10/2017 06:09 AM 1.84 (H) 0.44 - 1.00 mg/dL Final  12/09/2017 06:24 AM 1.83 (H) 0.44 - 1.00 mg/dL Final  12/08/2017 07:32 AM 1.71 (H) 0.44 - 1.00 mg/dL Final  12/07/2017 12:19 PM 1.70 (H) 0.44 - 1.00 mg/dL Final  09/19/2017 01:48 PM 1.17 (H) 0.44 - 1.00 mg/dL Final  08/30/2017 04:21 AM 1.46 (H) 0.44 - 1.00 mg/dL Final  08/29/2017 05:02 AM 1.41 (H) 0.44 - 1.00 mg/dL Final  08/28/2017 04:36 AM 1.33 (H) 0.44 - 1.00 mg/dL Final  08/27/2017 04:28 PM 1.62 (H) 0.44 - 1.00 mg/dL Final  02/13/2017 04:42 AM 1.55 (H)  0.44 - 1.00 mg/dL Final  02/12/2017 04:29 AM 1.41 (H) 0.44 - 1.00 mg/dL Final  02/11/2017 01:03 PM 1.21 (H) 0.44 - 1.00 mg/dL Final  12/18/2016 04:15 PM 1.40 (H) 0.44 - 1.00 mg/dL Final  01/04/2016 01:45 PM 1.70 (H) 0.44 - 1.00 mg/dL Final  11/21/2015 05:18 AM 1.17 (H) 0.44 - 1.00 mg/dL Final  11/20/2015 10:24 AM 1.15 (H) 0.44 - 1.00 mg/dL Final  09/07/2015 05:57 AM 1.01 (H) 0.44 - 1.00 mg/dL Final  09/06/2015 07:03 AM 1.12 (H) 0.44 - 1.00 mg/dL Final  09/05/2015 02:18 PM 1.22 (H) 0.44 - 1.00 mg/dL Final  11/29/2007 07:20 PM 1.24 (H)  Final  09/18/2007 10:40 PM 0.81  Final    PMH:   Past Medical History:  Diagnosis Date  . Arthritis   . CHF (congestive heart failure) ([place])   . Chronic back pain   . COPD  (chronic obstructive pulmonary disease) ([facility])    2L home O2  . Diabetes mellitus without complication ([place])   . Hyperlipidemia   . Hypertension   . Stroke (cerebrum) ([facility])     PSH:   Past Surgical History:  Procedure Laterality Date  . ABDOMINAL HYSTERECTOMY    . colonoscopy  2009   Dr. Gala Romney: cluster of diminutive rectal polyps and 2 diminutive polyps in rectosigmoid junction, hyperplastic  . COLONOSCOPY WITH PROPOFOL N/A 10/18/2017   Procedure: COLONOSCOPY WITH PROPOFOL;  Surgeon: Daneil Dolin, MD;  [facility];  Service: Endoscopy;  Laterality: N/A;  9:30am  . ESOPHAGOGASTRODUODENOSCOPY N/A 01/25/2018   Procedure: ESOPHAGOGASTRODUODENOSCOPY (EGD);  Surgeon: Danie Binder, MD;  Location: [facility];  Service: Endoscopy;  Laterality: N/A;  . ESOPHAGOGASTRODUODENOSCOPY (EGD) WITH PROPOFOL N/A 08/29/2017   Dr. Gala Romney with propofol: small hiatal hernia. 4cm fundal diverticulum.   Marland Kitchen GIVENS CAPSULE STUDY N/A 01/25/2018   Procedure: GIVENS CAPSULE STUDY;  Surgeon: Danie Binder, MD;  Location: [facility];  Service: Endoscopy;  Laterality: N/A;  . left hand middle finger     in an accident at work, amputated    Allergies: No Known Allergies  Medications:   Prior to Admission medications   Medication Sig Start Date End Date Taking? Authorizing Provider  aspirin EC 81 MG tablet Take 162 mg by mouth daily.   Yes [provider]  atorvastatin (LIPITOR) 80 MG tablet Take 80 mg by mouth daily.  12/04/16  Yes [provider]  budesonide-formoterol (SYMBICORT) 160-4.5 MCG/ACT inhaler Inhale 2 puffs into the lungs 2 (two) times daily. 11/23/15  Yes Barton Dubois, MD  buPROPion [facility] SR) 150 MG 12 hr tablet Take 150 mg by mouth 2 (two) times daily. 12/04/16  Yes [provider]  clopidogrel (PLAVIX) 75 MG tablet Take 75 mg by mouth daily.  03/29/18  Yes [provider]  famotidine (PEPCID) 20 MG tablet Take 20 mg by mouth daily.  03/29/18  Yes  [provider]  ferrous sulfate 325 (65 FE) MG EC tablet Take 1 tablet (325 mg total) by mouth 2 (two) times daily. 01/25/18 05/21/19 Yes Erline Hau, MD  ipratropium-albuterol (DUONEB) 0.5-2.5 (3) MG/3ML SOLN Take 3 mLs by nebulization every 6 (six) hours as needed (for shortness of breath).   Yes [provider]  Linaclotide (LINZESS) 145 MCG CAPS capsule Take 145 mcg by mouth daily as needed (for constipation).    Yes [provider]  metoprolol tartrate (LOPRESSOR) 25 MG tablet Take 0.5 tablets (12.5 mg total) by mouth 2 (two) times  daily. Patient taking differently: Take 25 mg by mouth daily.  08/30/17  Yes Tat, Shanon Brow, MD  omega-3 acid ethyl esters (LOVAZA) 1 g capsule Take 1 g by mouth daily.  12/07/16  Yes [provider]  pantoprazole (PROTONIX) 40 MG tablet Take 1 tablet (40 mg total) by mouth daily. 01/25/18  Yes Isaac Bliss, Rayford Halsted, MD  potassium chloride SA (K-DUR,KLOR-CON) 20 MEQ tablet Take 20 mEq by mouth 2 (two) times daily.   Yes [provider]  saxagliptin HCl (ONGLYZA) 5 MG TABS tablet Take 5 mg by mouth daily. Reported on 09/22/2015   Yes [provider]  SPIRIVA HANDIHALER 18 MCG inhalation capsule Take 1 puff by mouth daily. 10/31/17  Yes [provider]  torsemide (DEMADEX) 20 MG tablet Take 1 tablet (20 mg total) by mouth daily. Take 1 tablet on Monday, Wednesday, and Friday only. 02/26/19  Yes BranchAlphonse Guild, MD  valsartan-hydrochlorothiazide (DIOVAN-HCT) 160-25 MG tablet Take 1 tablet by mouth daily.   Yes [provider]    Inpatient medications: . aspirin EC  162 mg Oral Daily  . atorvastatin  80 mg Oral Daily  . buPROPion  150 mg Oral BID  . clopidogrel  75 mg Oral Daily  . famotidine  20 mg Oral Daily  . ferrous sulfate  325 mg Oral BID  . furosemide  40 mg Intravenous BID  . heparin  5,000 Units Subcutaneous Q8H  . insulin aspart  0-5 Units Subcutaneous QHS  . insulin  aspart  0-9 Units Subcutaneous TID WC  . mometasone-formoterol  2 puff Inhalation BID  . omega-3 acid ethyl esters  1 g Oral Daily  . pantoprazole  40 mg Oral Daily  . sodium bicarbonate  650 mg Oral BID    Discontinued Meds:   Medications Discontinued During This Encounter  Medication Reason  . metoprolol tartrate (LOPRESSOR) tablet 25 mg   . sodium zirconium cyclosilicate (LOKELMA) packet 10 g     Social History:  reports that she has been smoking cigarettes. She has a 52.00 pack-year smoking history. She has never used smokeless tobacco. She reports that she does not drink alcohol or use drugs.  Family History:   Family History  Problem Relation Age of Onset  . CVA Mother 29  . Cancer Neg Hx   . Colon cancer Neg Hx     Pertinent items are noted in HPI. Weight change:   Intake/Output Summary (Last 24 hours) at 05/22/2019 1104 Last data filed at 05/22/2019 0400 Gross per 24 hour  Intake 770.24 ml  Output 0 ml  Net 770.24 ml   BP (!) 90/33 (BP Location: Right Arm)   Pulse 74   Temp 98.4 F (36.9 C) (Oral)   Resp 20   Ht 5\' 4"  (1.626 m)   Wt 72.3 kg   SpO2 91%   BMI 27.36 kg/m  Vitals:   05/21/19 1936 05/21/19 2120 05/22/19 0548 05/22/19 0824  BP:  (!) 118/41 (!) 90/33   Pulse:  79 74   Resp:  20 20   Temp:  98.9 F (37.2 C) 98.4 F (36.9 C)   TempSrc:  Oral Oral   SpO2: 91% 94% 90% 91%  Weight:   72.3 kg   Height:         General appearance: alert, cooperative, no distress and thin and frail Head: Normocephalic, without obvious abnormality, atraumatic Eyes: negative findings: lids and lashes normal, conjunctivae and sclerae normal and corneas clear Resp: rhonchi bilaterally and  wheezes bilaterally Cardio: regular rate and rhythm, S1, S2 normal, no murmur, click, rub or gallop GI: soft, non-tender; bowel sounds normal; no masses,  no organomegaly Extremities: extremities normal, atraumatic, no cyanosis or edema  Labs: Basic Metabolic Panel: Recent Labs   Lab 05/21/19 1135 05/21/19 1523 05/22/19 0717  NA 140 139 141  K 6.2* 5.8* 6.0*  CL 114* 112* 113*  CO2 21* 22 22  GLUCOSE 108* 47* 92  BUN 38* 37* 36*  CREATININE 3.32* 3.27* 3.09*  ALBUMIN 3.9  --   --   CALCIUM 8.4* 8.3* 8.1*   Liver Function Tests: Recent Labs  Lab 05/21/19 1135  AST 7*  ALT 7  ALKPHOS 60  BILITOT 0.3  PROT 6.9  ALBUMIN 3.9   No results for input(s): LIPASE, AMYLASE in the last 168 hours. No results for input(s): AMMONIA in the last 168 hours. CBC: Recent Labs  Lab 05/21/19 1135 05/22/19 0717  WBC 4.0 4.8  NEUTROABS 2.3  --   HGB 12.5 11.6*  HCT 42.0 39.5  MCV 90.3 90.8  PLT 218 196   PT/INR: @LABRCNTIP (inr:5) Cardiac Enzymes: )No results for input(s): CKTOTAL, CKMB, CKMBINDEX, TROPONINI in the last 168 hours. CBG: Recent Labs  Lab 05/21/19 1309 05/21/19 1810 05/21/19 2143 05/22/19 0735  GLUCAP 84 84 86 98    Iron Studies: No results for input(s): IRON, TIBC, TRANSFERRIN, FERRITIN in the last 168 hours.  Xrays/Other Studies: US Renal  Result Date: 05/21/2019 CLINICAL DATA:  68 year old female with acute renal failure EXAM: RENAL / URINARY TRACT ULTRASOUND COMPLETE COMPARISON:  None. FINDINGS: Right Kidney: Renal measurements: 4.2 x 1.7 x 2.1 cm = volume: 7.4 mL. Congenitally atrophic kidney. No mass identified. Left Kidney: Renal measurements: 11.3 x 6.0 x 6.2 cm = volume: 223 mL. Echogenic renal parenchyma with increased conspicuity of the cortical medullary interface. No evidence of hydronephrosis or solid mass. Bladder: Appears normal for degree of bladder distention. IMPRESSION: 1. Echogenic left kidney consistent with underlying medical renal disease. 2. Congenitally atrophied and likely nonfunctional right kidney. Electronically Signed   By: Jacqulynn Cadet M.D.   On: 05/21/2019 16:24     Assessment/Plan: 1.  AKI/CKD stage 4- pt has a congenitally atrophied right kidney and echogenic left kidney, longstanding HTN, DM and  progressive CKD over the past 3 years as well as chronic NSAID use.  Bland urine sediment.  Possibly worsening renal function due to poor po intake with concomitant ARB and diuretics.  We discussed the severity and chronicity of her CKD and the need for regular Nephrology follow up.   1. Cr improved slightly with IVF's overnight 2. No indication for dialysis at this time  3. Stop ARB and KCl 4. Stop NSAIDs 5. Continue to follow UOP and daily Scr 2. Hyperkalemia- due to progressive loss of renal function in setting of ARB therapy and KCl supplementation.  Continue to hold ARB and KCl.  Agree with kayexalate and will also add IV lasix and follow UOP and K levels.  3. CKD stage 4- solitary kidney, HTN, DM, and NSAID use.   1. Discussed need for tight BP control (goal <130/80); tight glucose control (goal Hgb A1c <7%), use of an ace/arb (on hold due to AKI and hyperkalemia); avoidance of nephrotoxic agents ie. NSAIDs/COX-II I's, iv contrast  4. Anemia of CKD- stable. 5. Diastolic CHF- appears euvolemic this morning.  Restart lasix due to hyperkalemia as above 6. DM- per primary svc 7. HTN- BP low, hold meds and follow 8.  HLD- on statin 9. COPD- on chronic home O2.  Cont with inhalers and stressed smoking cessation.    Erica [name] A Adda Stokes 05/22/2019, 11:04 AM

## 2019-05-22 NOTE — Progress Notes (Signed)
PROGRESS NOTE    Erica [name]  [id number] DOB: 01/30/1951 DOA: 05/21/2019 PCP: Iona Beard, MD   Brief Narrative:  Per HPI: Erica [name] is a 68 y.o. female with medical history significant for COPD with chronic hypoxemia on home 3L nasal cannula oxygen, diastolic CHF with LVEF [id number] on echo 08/2018, type 2 diabetes, dyslipidemia, hypertension, GERD, prior CVA, tobacco abuse, and CKD stage III-4 who presented to the ED after being called by her PCP today with noted potassium value of 7.1.  Her labs were actually drawn 2 days ago.  She states that she has been having decreased appetite over the last 1 week and has not been eating very much.  She denies any nausea, vomiting, diarrhea and has been continuing to take her home medications to include potassium supplementation as previously prescribed.  She denies any shortness of breath, cough, fevers, or chills.  No new medications are otherwise noted.  It is unclear, if she has previously been referred to nephrology for follow-up regarding her advanced CKD and seems to be unaware of the problem.  Her labs were part of a routine physical 2 days ago.  8/27: Patient was admitted with acute hyperkalemia along with AKI on CKD stage III.  She appears to have ongoing hyperkalemia this morning that did not respond well to [facility] yesterday and therefore will try Kayexalate.  Appreciate nephrology recommendations with initiation of Lasix as well.  She is also noted to have some hypotension.  Assessment & Plan:   Active Problems:   Acute hyperkalemia  Acute hyperkalemia -Likely secondary to ongoing supplementation with potassium in the face of AKI versus advancing CKD -Hold home potassium supplementation -Monitor repeat labs in a.m. -Monitor on telemetry -We will try Kayexalate today and IV Lasix added by nephrology  AKI on CKD stage 4 versus advancing CKD -Likely prerenal due to recent poor appetite and intake -Appreciate nephrology  consultation and will need close follow-up outpatient -Monitor I's and O's -Avoid nephrotoxic agents -Repeat renal panel in a.m. -Stop NSAIDs, ARB, and KCl  Metabolic acidosis likely related to above-improving -We will plan to give sodium bicarbonate tablets -Repeat labs in a.m.  Chronic diastolic CHF -Appears to be euvolemic -Lasix started by nephrology  Type 2 diabetes-stable -Currently without significant hyperglycemia -Hold home agents and maintain on SSI -Carb modified diet  Hypertension -Maintain on home metoprolol -Hold Diovan HCT -Hydralazine as needed  Dyslipidemia -Maintain on home statin  GERD -Maintain on PPI and Pepcid  COPD with chronic hypoxemia -Maintain on home Symbicort -We will administer breathing treatments as needed for shortness of breath or wheezing   DVT prophylaxis: Heparin Code Status: Full Family Communication: None at bedside Disposition Plan: Continue ongoing treatment for hyperkalemia/AKI.  Per nephrology.   Consultants:   Nephrology  Procedures:   None  Antimicrobials:   None   Subjective: Patient seen and evaluated today with no new acute complaints or concerns. No acute concerns or events noted overnight.  Objective: Vitals:   05/21/19 1936 05/21/19 2120 05/22/19 0548 05/22/19 0824  BP:  (!) 118/41 (!) 90/33   Pulse:  79 74   Resp:  20 20   Temp:  98.9 F (37.2 C) 98.4 F (36.9 C)   TempSrc:  Oral Oral   SpO2: 91% 94% 90% 91%  Weight:   72.3 kg   Height:        Intake/Output Summary (Last 24 hours) at 05/22/2019 1346 Last data filed at 05/22/2019 0400 Gross per 24 hour  Intake 770.24 ml  Output 0 ml  Net 770.24 ml   Filed Weights   05/21/19 1112 05/22/19 0548  Weight: 71.2 kg 72.3 kg    Examination:  General exam: Appears calm and comfortable  Respiratory system: Clear to auscultation. Respiratory effort normal.  Currently on nasal cannula. Cardiovascular system: S1 & S2 heard, RRR. No JVD,  murmurs, rubs, gallops or clicks. No pedal edema. Gastrointestinal system: Abdomen is nondistended, soft and nontender. No organomegaly or masses felt. Normal bowel sounds heard. Central nervous system: Alert and oriented. No focal neurological deficits. Extremities: Symmetric 5 x 5 power. Skin: No rashes, lesions or ulcers Psychiatry: Judgement and insight appear normal. Mood & affect appropriate.     Data Reviewed: I have personally reviewed following labs and imaging studies  CBC: Recent Labs  Lab 05/21/19 1135 05/22/19 0717  WBC 4.0 4.8  NEUTROABS 2.3  --   HGB 12.5 11.6*  HCT 42.0 39.5  MCV 90.3 90.8  PLT 218 [id number]   Basic Metabolic Panel: Recent Labs  Lab 05/21/19 1135 05/21/19 1523 05/22/19 0717  NA 140 139 141  K 6.2* 5.8* 6.0*  CL 114* 112* 113*  CO2 21* 22 22  GLUCOSE 108* 47* 92  BUN 38* 37* 36*  CREATININE 3.32* 3.27* 3.09*  CALCIUM 8.4* 8.3* 8.1*  MG 2.0  --  2.1   GFR: Estimated Creatinine Clearance: 16.5 mL/min (A) (by C-G formula based on SCr of 3.09 mg/dL (H)). Liver Function Tests: Recent Labs  Lab 05/21/19 1135  AST 7*  ALT 7  ALKPHOS 60  BILITOT 0.3  PROT 6.9  ALBUMIN 3.9   No results for input(s): LIPASE, AMYLASE in the last 168 hours. No results for input(s): AMMONIA in the last 168 hours. Coagulation Profile: No results for input(s): INR, PROTIME in the last 168 hours. Cardiac Enzymes: No results for input(s): CKTOTAL, CKMB, CKMBINDEX, TROPONINI in the last 168 hours. BNP (last 3 results) No results for input(s): PROBNP in the last 8760 hours. HbA1C: Recent Labs    05/21/19 1426  HGBA1C 4.2*   CBG: Recent Labs  Lab 05/21/19 1309 05/21/19 1810 05/21/19 2143 05/22/19 0735 05/22/19 1104  GLUCAP 84 84 86 98 93   Lipid Profile: No results for input(s): CHOL, HDL, LDLCALC, TRIG, CHOLHDL, LDLDIRECT in the last 72 hours. Thyroid Function Tests: No results for input(s): TSH, T4TOTAL, FREET4, T3FREE, THYROIDAB in the last 72  hours. Anemia Panel: No results for input(s): VITAMINB12, FOLATE, FERRITIN, TIBC, IRON, RETICCTPCT in the last 72 hours. Sepsis Labs: No results for input(s): PROCALCITON, LATICACIDVEN in the last 168 hours.  Recent Results (from the past 240 hour(s))  SARS CORONAVIRUS 2 (TAT 6-12 HRS) Nasal Swab Aptima Multi Swab     Status: None   Collection Time: 05/21/19 12:57 PM   Specimen: Aptima Multi Swab; Nasal Swab  Result Value Ref Range Status   SARS Coronavirus 2 NEGATIVE NEGATIVE Final    Comment: (NOTE) SARS-CoV-2 target nucleic acids are NOT DETECTED. The SARS-CoV-2 RNA is generally detectable in upper and lower respiratory specimens during the acute phase of infection. Negative results do not preclude SARS-CoV-2 infection, do not rule out co-infections with other pathogens, and should not be used as the sole basis for treatment or other patient management decisions. Negative results must be combined with clinical observations, patient history, and epidemiological information. The expected result is Negative. Fact Sheet for Patients: SugarRoll.be Fact Sheet for Healthcare Providers: https://www.woods-mathews.com/ This test is not yet approved or cleared by the  [place] Architectural [employer]ologist and  has been authorized for detection and/or diagnosis of SARS-CoV-2 by FDA under an Print production planner (EUA). This EUA will remain  in effect (meaning this test can be used) for the duration of the COVID-19 declaration under Section 56 4(b)(1) of the Act, 21 U.S.C. section 360bbb-3(b)(1), unless the authorization is terminated or revoked sooner. Performed at [facility], [place] [address] [place], [place] [address]          Radiology Studies: US Renal  Result Date: 05/21/2019 CLINICAL DATA:  68 year old female with acute renal failure EXAM: RENAL / URINARY TRACT ULTRASOUND COMPLETE COMPARISON:  None. FINDINGS: Right Kidney: Renal measurements:  4.2 x 1.7 x 2.1 cm = volume: 7.4 mL. Congenitally atrophic kidney. No mass identified. Left Kidney: Renal measurements: 11.3 x 6.0 x 6.2 cm = volume: 223 mL. Echogenic renal parenchyma with increased conspicuity of the cortical medullary interface. No evidence of hydronephrosis or solid mass. Bladder: Appears normal for degree of bladder distention. IMPRESSION: 1. Echogenic left kidney consistent with underlying medical renal disease. 2. Congenitally atrophied and likely nonfunctional right kidney. Electronically Signed   By: Jacqulynn Cadet M.D.   On: 05/21/2019 16:24        Scheduled Meds: . aspirin EC  162 mg Oral Daily  . atorvastatin  80 mg Oral Daily  . buPROPion  150 mg Oral BID  . clopidogrel  75 mg Oral Daily  . famotidine  20 mg Oral Daily  . ferrous sulfate  325 mg Oral BID  . furosemide  40 mg Intravenous BID  . heparin  5,000 Units Subcutaneous Q8H  . insulin aspart  0-5 Units Subcutaneous QHS  . insulin aspart  0-9 Units Subcutaneous TID WC  . mometasone-formoterol  2 puff Inhalation BID  . omega-3 acid ethyl esters  1 g Oral Daily  . pantoprazole  40 mg Oral Daily  . sodium bicarbonate  650 mg Oral BID   Continuous Infusions:   LOS: 0 days    Time spent: 30 minutes    Aquarius Latouche Darleen Crocker, DO [facility] Pager [phone]  If 7PM-7AM, please contact night-coverage [email or web address] Password [email or web address] 05/22/2019, 1:46 PM

## 2019-05-23 DIAGNOSIS — I5030 Unspecified diastolic (congestive) heart failure: Secondary | ICD-10-CM | POA: Diagnosis not present

## 2019-05-23 DIAGNOSIS — I130 Hypertensive heart and chronic kidney disease with heart failure and stage 1 through stage 4 chronic kidney disease, or unspecified chronic kidney disease: Secondary | ICD-10-CM | POA: Diagnosis not present

## 2019-05-23 DIAGNOSIS — N179 Acute kidney failure, unspecified: Secondary | ICD-10-CM | POA: Diagnosis not present

## 2019-05-23 DIAGNOSIS — E875 Hyperkalemia: Secondary | ICD-10-CM | POA: Diagnosis not present

## 2019-05-23 DIAGNOSIS — E1122 Type 2 diabetes mellitus with diabetic chronic kidney disease: Secondary | ICD-10-CM | POA: Diagnosis not present

## 2019-05-23 LAB — CBC
HCT: 38.7 % (ref 36.0–46.0)
Hemoglobin: 11.5 g/dL — ABNORMAL LOW (ref 12.0–15.0)
MCH: 26.7 pg (ref 26.0–34.0)
MCHC: 29.7 g/dL — ABNORMAL LOW (ref 30.0–36.0)
MCV: 89.8 fL (ref 80.0–100.0)
Platelets: 206 10*3/uL (ref 150–400)
RBC: 4.31 MIL/uL (ref 3.87–5.11)
RDW: 14.6 % (ref 11.5–15.5)
WBC: 3.8 10*3/uL — ABNORMAL LOW (ref 4.0–10.5)
nRBC: 0 % (ref 0.0–0.2)

## 2019-05-23 LAB — PROTEIN ELECTROPHORESIS, SERUM
A/G Ratio: 1.6 (ref 0.7–1.7)
Albumin ELP: 3.6 g/dL (ref 2.9–4.4)
Alpha-1-Globulin: 0.2 g/dL (ref 0.0–0.4)
Alpha-2-Globulin: 0.6 g/dL (ref 0.4–1.0)
Beta Globulin: 0.8 g/dL (ref 0.7–1.3)
Gamma Globulin: 0.5 g/dL (ref 0.4–1.8)
Globulin, Total: 2.2 g/dL (ref 2.2–3.9)
Total Protein ELP: 5.8 g/dL — ABNORMAL LOW (ref 6.0–8.5)

## 2019-05-23 LAB — GLUCOSE, CAPILLARY
Glucose-Capillary: 79 mg/dL (ref 70–99)
Glucose-Capillary: 78 mg/dL (ref 70–99)

## 2019-05-23 LAB — RENAL FUNCTION PANEL
Albumin: 3.5 g/dL (ref 3.5–5.0)
Anion gap: 9 (ref 5–15)
BUN: 34 mg/dL — ABNORMAL HIGH (ref 8–23)
CO2: 26 mmol/L (ref 22–32)
Calcium: 8 mg/dL — ABNORMAL LOW (ref 8.9–10.3)
Chloride: 110 mmol/L (ref 98–111)
Creatinine, Ser: 2.81 mg/dL — ABNORMAL HIGH (ref 0.44–1.00)
GFR calc Af Amer: 19 mL/min — ABNORMAL LOW (ref >60)
GFR calc non Af Amer: 16 mL/min — ABNORMAL LOW (ref >60)
Glucose, Bld: 92 mg/dL (ref 70–99)
Phosphorus: 5.5 mg/dL — ABNORMAL HIGH (ref 2.5–4.6)
Potassium: 4.8 mmol/L (ref 3.5–5.1)
Sodium: 145 mmol/L (ref 135–145)

## 2019-05-23 LAB — IMMUNOFIXATION ELECTROPHORESIS
IgA: 227 mg/dL (ref 87–352)
IgG (Immunoglobin G), Serum: 722 mg/dL (ref 586–1602)
IgM (Immunoglobulin M), Srm: 46 mg/dL (ref 26–217)
Total Protein ELP: 5.8 g/dL — ABNORMAL LOW (ref 6.0–8.5)

## 2019-05-23 LAB — IMMUNOFIXATION, URINE

## 2019-05-23 LAB — KAPPA/LAMBDA LIGHT CHAINS
Kappa free light chain: 37.8 mg/L — ABNORMAL HIGH (ref 3.3–19.4)
Kappa, lambda light chain ratio: 1.77 — ABNORMAL HIGH (ref 0.26–1.65)
Lambda free light chains: 21.4 mg/L (ref 5.7–26.3)

## 2019-05-23 NOTE — Plan of Care (Signed)
  Problem: Education: Goal: Knowledge of disease and its progression will improve Outcome: Adequate for Discharge Goal: Individualized Educational Video(s) Outcome: Adequate for Discharge   Problem: Fluid Volume: Goal: Compliance with measures to maintain balanced fluid volume will improve Outcome: Adequate for Discharge   Problem: Health Behavior/Discharge Planning: Goal: Ability to manage health-related needs will improve Outcome: Adequate for Discharge   Problem: Nutritional: Goal: Ability to make healthy dietary choices will improve Outcome: Adequate for Discharge   Problem: Clinical Measurements: Goal: Complications related to the disease process, condition or treatment will be avoided or minimized Outcome: Adequate for Discharge   Problem: Education: Goal: Knowledge of disease and its progression will improve Outcome: Adequate for Discharge Goal: Individualized Educational Video(s) Outcome: Adequate for Discharge   Problem: Fluid Volume: Goal: Compliance with measures to maintain balanced fluid volume will improve Outcome: Adequate for Discharge   Problem: Health Behavior/Discharge Planning: Goal: Ability to manage health-related needs will improve Outcome: Adequate for Discharge   Problem: Nutritional: Goal: Ability to make healthy dietary choices will improve Outcome: Adequate for Discharge   Problem: Clinical Measurements: Goal: Complications related to the disease process, condition or treatment will be avoided or minimized Outcome: Adequate for Discharge   Problem: Education: Goal: Knowledge of General Education information will improve Description: Including pain rating scale, medication(s)/side effects and non-pharmacologic comfort measures Outcome: Adequate for Discharge   Problem: Health Behavior/Discharge Planning: Goal: Ability to manage health-related needs will improve Outcome: Adequate for Discharge   Problem: Clinical Measurements: Goal:  Ability to maintain clinical measurements within normal limits will improve Outcome: Adequate for Discharge Goal: Will remain free from infection Outcome: Adequate for Discharge Goal: Diagnostic test results will improve Outcome: Adequate for Discharge Goal: Respiratory complications will improve Outcome: Adequate for Discharge Goal: Cardiovascular complication will be avoided Outcome: Adequate for Discharge   Problem: Activity: Goal: Risk for activity intolerance will decrease Outcome: Adequate for Discharge   Problem: Nutrition: Goal: Adequate nutrition will be maintained Outcome: Adequate for Discharge   Problem: Coping: Goal: Level of anxiety will decrease Outcome: Adequate for Discharge   Problem: Elimination: Goal: Will not experience complications related to bowel motility Outcome: Adequate for Discharge Goal: Will not experience complications related to urinary retention Outcome: Adequate for Discharge   Problem: Pain Managment: Goal: General experience of comfort will improve Outcome: Adequate for Discharge   Problem: Safety: Goal: Ability to remain free from injury will improve Outcome: Adequate for Discharge   Problem: Skin Integrity: Goal: Risk for impaired skin integrity will decrease Outcome: Adequate for Discharge

## 2019-05-23 NOTE — Progress Notes (Signed)
[facility] Progress Note    68y with HTN, tobacco abuse, COPD on home  oxygen, DM, diastolic CHF h/o CVA, HLD, and CKD stage 4 who presented to [facility] ED w/ hyperkalemia.  Found to have a K of 6.2 without EKG changes, BUN/Cr of 38/3.32, CO2 21 and was admitted for management of her hyperkalemia.   Admits to taking ibuprofen on a regular basis with renal US revealed an atrophic right kidney at 4.2 cm and left kidney 11.3cm with increased echogenicity consistent with chronic medical renal disease.    Assessment/ Plan:   1.  AKI/CKD stage 4- pt has a congenitally atrophied right kidney and echogenic left kidney, longstanding HTN, DM and progressive CKD over the past 3 years as well as chronic NSAID use.  Bland urine sediment.  Possibly worsening renal function due to poor po intake with concomitant ARB and diuretics.     1. Cr improving trend. 2. No indication for dialysis at this time  3. Stopped ARB and KCl 4. Stopped NSAIDs 5. Agree with d/c + follow up with renal in a month. 2. Hyperkalemia- due to progressive loss of renal function in setting of ARB therapy and KCl supplementation.  Continue to hold ARB and KCl.   3. CKD stage 4- solitary kidney, HTN, DM, and NSAID use.   1. Discussed need for tight BP control (goal <130/80); tight glucose control (goal Hgb A1c <7%), may rechallenge w/ ace/arb but avoid K replacement for now  4. Anemia of CKD- stable. 5. Diastolic CHF- appears euvolemic this morning.  Restart lasix due to hyperkalemia as above 6. DM- per primary svc 7. HTN- BP low, hold meds and follow 8. HLD- on statin 9. COPD- on chronic home O2.  Cont with inhalers and stressed smoking cessation.   Subjective:   Feels much better but appetite still poor. Denies f/c/n/v.   Objective:   BP (!) 99/49 (BP Location: Right Arm)   Pulse 73   Temp 99.8 F (37.7 C) (Oral)   Resp 17   Ht 5\' 4"  (1.626 m)   Wt 72 kg   SpO2 93%   BMI 27.25 kg/m   Intake/Output Summary  (Last 24 hours) at 05/23/2019 1243 Last data filed at 05/22/2019 1800 Gross per 24 hour  Intake 60 ml  Output 300 ml  Net -240 ml   Weight change: 0.785 kg  Physical Exam: General appearance: NAD, alert, cooperative Resp: no rales but rhonchi present Cardio: RRR GI: sndnt Extremities: no  edema  Imaging: US Renal  Result Date: 05/21/2019 CLINICAL DATA:  68 year old female with acute renal failure EXAM: RENAL / URINARY TRACT ULTRASOUND COMPLETE COMPARISON:  None. FINDINGS: Right Kidney: Renal measurements: 4.2 x 1.7 x 2.1 cm = volume: 7.4 mL. Congenitally atrophic kidney. No mass identified. Left Kidney: Renal measurements: 11.3 x 6.0 x 6.2 cm = volume: 223 mL. Echogenic renal parenchyma with increased conspicuity of the cortical medullary interface. No evidence of hydronephrosis or solid mass. Bladder: Appears normal for degree of bladder distention. IMPRESSION: 1. Echogenic left kidney consistent with underlying medical renal disease. 2. Congenitally atrophied and likely nonfunctional right kidney. Electronically Signed   By: Jacqulynn Cadet M.D.   On: 05/21/2019 16:24    Labs: BMET Recent Labs  Lab 05/21/19 1135 05/21/19 1523 05/22/19 0717 05/23/19 0855  NA 140 139 141  141 145  K 6.2* 5.8* 6.1*  6.0* 4.8  CL 114* 112* 115*  113* 110  CO2 21* 22 18*  22 26  GLUCOSE 108* 47* 86  92 92  BUN 38* 37* 37*  36* 34*  CREATININE 3.32* 3.27* 3.21*  3.09* 2.81*  CALCIUM 8.4* 8.3* 8.0*  8.1* 8.0*  PHOS  --   --  6.1* 5.5*   CBC Recent Labs  Lab 05/21/19 1135 05/22/19 0717 05/23/19 0855  WBC 4.0 4.8 3.8*  NEUTROABS 2.3  --   --   HGB 12.5 11.6* 11.5*  HCT 42.0 39.5 38.7  MCV 90.3 90.8 89.8  PLT 218 196 206    Medications:    . aspirin EC  162 mg Oral Daily  . atorvastatin  80 mg Oral Daily  . buPROPion  150 mg Oral BID  . clopidogrel  75 mg Oral Daily  . famotidine  20 mg Oral Daily  . ferrous sulfate  325 mg Oral BID  . furosemide  40 mg Intravenous BID   . heparin  5,000 Units Subcutaneous Q8H  . insulin aspart  0-5 Units Subcutaneous QHS  . insulin aspart  0-9 Units Subcutaneous TID WC  . mometasone-formoterol  2 puff Inhalation BID  . omega-3 acid ethyl esters  1 g Oral Daily  . pantoprazole  40 mg Oral Daily  . sodium bicarbonate  650 mg Oral BID      Otelia Santee, MD 05/23/2019, 12:43 PM

## 2019-05-23 NOTE — Care Management Obs Status (Signed)
[facility] NOTIFICATION   Patient Details  Name: Erica [name] MRN: [id number] Date of Birth: 08/22/1951   Medicare Observation Status Notification Given:  Yes(late entry for notification given to pt on 8/27)    Shade Flood, LCSW 05/23/2019, 11:19 AM

## 2019-05-23 NOTE — Discharge Summary (Signed)
Physician Discharge Summary  Erica [name] [id number] DOB: 1950-12-06 DOA: 05/21/2019  PCP: Iona Beard, MD  Admit date: 05/21/2019  Discharge date: 05/23/2019  Admitted From:Home   Disposition:  Home  Recommendations for Outpatient Follow-up:  1. Follow up with PCP in 3-5 days and repeat BMP to ensure the potassium levels are stable 2. Please refer patient to nephrology for follow-up regarding CKD stage IV and hemodialysis preparations that will be needed soon 3. Continue home torsemide and discontinue potassium supplementation for now 4. Hold home Diovan HCT as well as metoprolol for now given some soft blood pressure readings  Home Health: None  Equipment/Devices: Has home 3 L nasal cannula  Discharge Condition: Stable  CODE STATUS: Full  Diet recommendation: Heart Healthy/carb modified  Brief/Interim Summary: Per HPI: Arnel Hamblet Murphyis a 68 y.o.femalewith medical history significant forCOPD with chronic hypoxemia on home 3Lnasal cannula oxygen, diastolic CHF with LVEF [id number] on echo 08/2018, type 2 diabetes, dyslipidemia, hypertension, GERD, prior CVA, tobacco abuse, and CKD stage III-4 who presented to the ED after being called by her PCP today with noted potassium value of 7.1. Her labs were actually drawn 2 days ago. She states that she has been having decreased appetite over the last 1 week and has not been eating very much. She denies any nausea, vomiting, diarrhea and has been continuing to take her home medications to include potassium supplementation as previously prescribed. She denies any shortness of breath, cough, fevers, or chills. No new medications are otherwise noted. It is unclear, if she has previously been referred to nephrology for follow-up regarding her advanced CKD and seems to be unaware of the problem. Her labs were part of a routine physical 2 days ago.  8/27: Patient was admitted with acute hyperkalemia along with AKI on CKD stage III.   She appears to have ongoing hyperkalemia this morning that did not respond well to [facility] yesterday and therefore will try Kayexalate.  Appreciate nephrology recommendations with initiation of Lasix as well.  She is also noted to have some hypotension.  8/28: Patient's potassium levels are much improved and are 4.8 this morning.  She is to hold further home supplementation as well as her ARB.  She may resume her home torsemide as prior and will need close outpatient follow-up with nephrology after close follow-up with her PCP in the next 3 to 5 days.  Her AKI has improved with creatinine of approximately 2.8 on day of discharge.  Her renal ultrasound as noted below with atrophy of the right kidney noted.  She will need her potassium levels rechecked and medications adjusted based on her blood pressure readings.  She is otherwise stable for discharge with no acute events or concerns otherwise noted throughout the course of this admission.  Discharge Diagnoses:  Active Problems:   Acute hyperkalemia  Principal discharge diagnosis: Acute hyperkalemia secondary to AKI with ongoing supplementation  Discharge Instructions  Discharge Instructions    Diet - low sodium heart healthy   Complete by: As directed    Increase activity slowly   Complete by: As directed      Allergies as of 05/23/2019   No Known Allergies     Medication List    STOP taking these medications   metoprolol tartrate 25 MG tablet Commonly known as: LOPRESSOR   potassium chloride SA 20 MEQ tablet Commonly known as: K-DUR   valsartan-hydrochlorothiazide 160-25 MG tablet Commonly known as: DIOVAN-HCT     TAKE these medications   aspirin  EC 81 MG tablet Take 162 mg by mouth daily.   atorvastatin 80 MG tablet Commonly known as: LIPITOR Take 80 mg by mouth daily.   budesonide-formoterol 160-4.5 MCG/ACT inhaler Commonly known as: Symbicort Inhale 2 puffs into the lungs 2 (two) times daily.   buPROPion 150 MG 12  hr tablet Commonly known as: WELLBUTRIN SR Take 150 mg by mouth 2 (two) times daily.   clopidogrel 75 MG tablet Commonly known as: PLAVIX Take 75 mg by mouth daily.   famotidine 20 MG tablet Commonly known as: PEPCID Take 20 mg by mouth daily.   ferrous sulfate 325 (65 FE) MG EC tablet Take 1 tablet (325 mg total) by mouth 2 (two) times daily.   ipratropium-albuterol 0.5-2.5 (3) MG/3ML Soln Commonly known as: DUONEB Take 3 mLs by nebulization every 6 (six) hours as needed (for shortness of breath).   Linzess 145 MCG Caps capsule Generic drug: linaclotide Take 145 mcg by mouth daily as needed (for constipation).   omega-3 acid ethyl esters 1 g capsule Commonly known as: LOVAZA Take 1 g by mouth daily.   Onglyza 5 MG Tabs tablet Generic drug: saxagliptin HCl Take 5 mg by mouth daily. Reported on 09/22/2015   pantoprazole 40 MG tablet Commonly known as: PROTONIX Take 1 tablet (40 mg total) by mouth daily.   Spiriva HandiHaler 18 MCG inhalation capsule Generic drug: tiotropium Take 1 puff by mouth daily.   torsemide 20 MG tablet Commonly known as: DEMADEX Take 1 tablet (20 mg total) by mouth daily. Take 1 tablet on Monday, Wednesday, and Friday only.      Follow-up Information    Iona Beard, MD Follow up in 3 day(s).   Specialty: Family Medicine Contact information: [address] [place] [address] [phone]        Arnoldo Lenis, MD .   Specialty: Cardiology Contact information: [address] [place] [address] [phone]          No Known Allergies  Consultations:  Nephrology   Procedures/Studies: US Renal  Result Date: 05/21/2019 CLINICAL DATA:  68 year old female with acute renal failure EXAM: RENAL / URINARY TRACT ULTRASOUND COMPLETE COMPARISON:  None. FINDINGS: Right Kidney: Renal measurements: 4.2 x 1.7 x 2.1 cm = volume: 7.4 mL. Congenitally atrophic kidney. No mass identified. Left Kidney: Renal measurements:  11.3 x 6.0 x 6.2 cm = volume: 223 mL. Echogenic renal parenchyma with increased conspicuity of the cortical medullary interface. No evidence of hydronephrosis or solid mass. Bladder: Appears normal for degree of bladder distention. IMPRESSION: 1. Echogenic left kidney consistent with underlying medical renal disease. 2. Congenitally atrophied and likely nonfunctional right kidney. Electronically Signed   By: Jacqulynn Cadet M.D.   On: 05/21/2019 16:24     Discharge Exam: Vitals:   05/23/19 0451 05/23/19 0737  BP: (!) 99/49   Pulse: 73   Resp: 17   Temp: 99.8 F (37.7 C)   SpO2: 92% 93%   Vitals:   05/22/19 2047 05/23/19 0451 05/23/19 0500 05/23/19 0737  BP: (!) 98/39 (!) 99/49    Pulse: 74 73    Resp: 18 17    Temp: 98.7 F (37.1 C) 99.8 F (37.7 C)    TempSrc: Oral Oral    SpO2: 100% 92%  93%  Weight:   72 kg   Height:        General: Pt is alert, awake, not in acute distress Cardiovascular: RRR, S1/S2 +, no rubs, no gallops Respiratory:  CTA bilaterally, no wheezing, no rhonchi, currently on 3 L nasal cannula Abdominal: Soft, NT, ND, bowel sounds + Extremities: no edema, no cyanosis    The results of significant diagnostics from this hospitalization (including imaging, microbiology, ancillary and laboratory) are listed below for reference.     Microbiology: Recent Results (from the past 240 hour(s))  SARS CORONAVIRUS 2 (TAT 6-12 HRS) Nasal Swab Aptima Multi Swab     Status: None   Collection Time: 05/21/19 12:57 PM   Specimen: Aptima Multi Swab; Nasal Swab  Result Value Ref Range Status   SARS Coronavirus 2 NEGATIVE NEGATIVE Final    Comment: (NOTE) SARS-CoV-2 target nucleic acids are NOT DETECTED. The SARS-CoV-2 RNA is generally detectable in upper and lower respiratory specimens during the acute phase of infection. Negative results do not preclude SARS-CoV-2 infection, do not rule out co-infections with other pathogens, and should not be used as the sole basis  for treatment or other patient management decisions. Negative results must be combined with clinical observations, patient history, and epidemiological information. The expected result is Negative. Fact Sheet for Patients: SugarRoll.be Fact Sheet for Healthcare Providers: https://www.woods-mathews.com/ This test is not yet approved or cleared by the [place] FDA and  has been authorized for detection and/or diagnosis of SARS-CoV-2 by FDA under an Emergency Use Authorization (EUA). This EUA will remain  in effect (meaning this test can be used) for the duration of the COVID-19 declaration under Section 56 4(b)(1) of the Act, 21 U.S.C. section 360bbb-3(b)(1), unless the authorization is terminated or revoked sooner. Performed at [facility], [place] [address] [place], [place] [address]      Labs: BNP (last 3 results) Recent Labs    09/03/18 1620 12/25/18 1852  BNP 200.0* [id number]*   Basic Metabolic Panel: Recent Labs  Lab 05/21/19 1135 05/21/19 1523 05/22/19 0717 05/23/19 0855  NA 140 139 141  141 145  K 6.2* 5.8* 6.1*  6.0* 4.8  CL 114* 112* 115*  113* 110  CO2 21* 22 18*  22 26  GLUCOSE 108* 47* 86  92 92  BUN 38* 37* 37*  36* 34*  CREATININE 3.32* 3.27* 3.21*  3.09* 2.81*  CALCIUM 8.4* 8.3* 8.0*  8.1* 8.0*  MG 2.0  --  2.1  --   PHOS  --   --  6.1* 5.5*   Liver Function Tests: Recent Labs  Lab 05/21/19 1135 05/22/19 0717 05/23/19 0855  AST 7*  --   --   ALT 7  --   --   ALKPHOS 60  --   --   BILITOT 0.3  --   --   PROT 6.9  --   --   ALBUMIN 3.9 3.4* 3.5   No results for input(s): LIPASE, AMYLASE in the last 168 hours. No results for input(s): AMMONIA in the last 168 hours. CBC: Recent Labs  Lab 05/21/19 1135 05/22/19 0717 05/23/19 0855  WBC 4.0 4.8 3.8*  NEUTROABS 2.3  --   --   HGB 12.5 11.6* 11.5*  HCT 42.0 39.5 38.7  MCV 90.3 90.8 89.8  PLT 218 196 206   Cardiac Enzymes: No results  for input(s): CKTOTAL, CKMB, CKMBINDEX, TROPONINI in the last 168 hours. BNP: Invalid input(s): POCBNP CBG: Recent Labs  Lab 05/22/19 1104 05/22/19 1639 05/22/19 2012 05/23/19 0752 05/23/19 1141  GLUCAP 93 99 123* 79 78   D-Dimer No results for input(s): DDIMER in the last 72 hours. Hgb A1c Recent Labs  05/21/19 1426  HGBA1C 4.2*   Lipid Profile No results for input(s): CHOL, HDL, LDLCALC, TRIG, CHOLHDL, LDLDIRECT in the last 72 hours. Thyroid function studies No results for input(s): TSH, T4TOTAL, T3FREE, THYROIDAB in the last 72 hours.  Invalid input(s): FREET3 Anemia work up No results for input(s): VITAMINB12, FOLATE, FERRITIN, TIBC, IRON, RETICCTPCT in the last 72 hours. Urinalysis    Component Value Date/Time   COLORURINE YELLOW 05/21/2019 1136   APPEARANCEUR CLEAR 05/21/2019 1136   LABSPEC 1.011 05/21/2019 1136   PHURINE 5.0 05/21/2019 1136   GLUCOSEU NEGATIVE 05/21/2019 1136   HGBUR NEGATIVE 05/21/2019 1136   [place] 05/21/2019 1136   KETONESUR NEGATIVE 05/21/2019 1136   PROTEINUR NEGATIVE 05/21/2019 1136   UROBILINOGEN 0.2 03/14/2013 1847   NITRITE NEGATIVE 05/21/2019 1136   LEUKOCYTESUR NEGATIVE 05/21/2019 1136   Sepsis Labs Invalid input(s): PROCALCITONIN,  WBC,  LACTICIDVEN Microbiology Recent Results (from the past 240 hour(s))  SARS CORONAVIRUS 2 (TAT 6-12 HRS) Nasal Swab Aptima Multi Swab     Status: None   Collection Time: 05/21/19 12:57 PM   Specimen: Aptima Multi Swab; Nasal Swab  Result Value Ref Range Status   SARS Coronavirus 2 NEGATIVE NEGATIVE Final    Comment: (NOTE) SARS-CoV-2 target nucleic acids are NOT DETECTED. The SARS-CoV-2 RNA is generally detectable in upper and lower respiratory specimens during the acute phase of infection. Negative results do not preclude SARS-CoV-2 infection, do not rule out co-infections with other pathogens, and should not be used as the sole basis for treatment or other patient  management decisions. Negative results must be combined with clinical observations, patient history, and epidemiological information. The expected result is Negative. Fact Sheet for Patients: SugarRoll.be Fact Sheet for Healthcare Providers: https://www.woods-mathews.com/ This test is not yet approved or cleared by the [place] FDA and  has been authorized for detection and/or diagnosis of SARS-CoV-2 by FDA under an Emergency Use Authorization (EUA). This EUA will remain  in effect (meaning this test can be used) for the duration of the COVID-19 declaration under Section 56 4(b)(1) of the Act, 21 U.S.C. section 360bbb-3(b)(1), unless the authorization is terminated or revoked sooner. Performed at [facility], [place] [address] [place], [place] [address]      Time coordinating discharge: 35 minutes  SIGNED:   Rodena Goldmann, DO [facility] 05/23/2019, 11:57 AM  If 7PM-7AM, please contact night-coverage [email or web address] Password [email or web address]

## 2019-06-20 ENCOUNTER — Ambulatory Visit (INDEPENDENT_AMBULATORY_CARE_PROVIDER_SITE_OTHER): Payer: Medicare HMO | Admitting: Cardiology

## 2019-06-20 ENCOUNTER — Other Ambulatory Visit: Payer: Self-pay

## 2019-06-20 ENCOUNTER — Encounter: Payer: Self-pay | Admitting: Cardiology

## 2019-06-20 VITALS — BP 114/59 | HR 82 | Temp 97.3°F | Ht 64.0 in | Wt 150.0 lb

## 2019-06-20 DIAGNOSIS — E875 Hyperkalemia: Secondary | ICD-10-CM

## 2019-06-20 DIAGNOSIS — I5032 Chronic diastolic (congestive) heart failure: Secondary | ICD-10-CM

## 2019-06-20 DIAGNOSIS — E782 Mixed hyperlipidemia: Secondary | ICD-10-CM

## 2019-06-20 DIAGNOSIS — R0602 Shortness of breath: Secondary | ICD-10-CM

## 2019-06-20 DIAGNOSIS — I10 Essential (primary) hypertension: Secondary | ICD-10-CM | POA: Diagnosis not present

## 2019-06-20 NOTE — Progress Notes (Signed)
Clinical Summary Erica [name] is a 68 y.o.female seen today for follow up of the following medical problems.   1. SOB - admit 11/2017 with fever, leukocytosis, severe hypoxia to 70s - mild trop elevation at that time, peak 0.53. Thought to be demand ischemia. Based on risk factors plans for outpatient stress pending symptoms.  - 11/2017 echo LVEF 60-65%, no WMAs   - some SOB at night with sleeping. No recent edema, no weight gain. Taking toresmide just 3 times a week.    2. Chronic diastolic HF -[id number] ech LVEF 65%, abnormal diasotlic function with elevated filling pressures   - no recent edema. Has actually lost significant weight over the last few months.   3. COPD - on home O2 3L - +productive cough x 1 month, increased wheezing - had been on prednisone by pcp last month   4. Hypertension - compliant with meds   5. Hyperlipidemia - labs followed by pcp - compliant with statin   6. DM2 - followed by pcp   7. CKD IV   8. PSVT - denies any recent palpitations  9 History of GI bleed - admit 01/2018 with GI bleed. Hgb 7.3 down from 9. Had heme +stools.  - EGD normal. Bleed thought due to AVMs. Transfused 2 units pRBC   10. Hyperkalemia -admit 04/2019  11. History of CVA - on ASA and plavix according to notes, not for any heart reason Past Medical History:  Diagnosis Date  . Arthritis   . CHF (congestive heart failure) ([place])   . Chronic back pain   . COPD (chronic obstructive pulmonary disease) ([facility])    2L home O2  . Diabetes mellitus without complication ([place])   . Hyperlipidemia   . Hypertension   . Stroke (cerebrum) ([facility])      No Known Allergies   Current Outpatient Medications  Medication Sig Dispense Refill  . aspirin EC 81 MG tablet Take 162 mg by mouth daily.    Marland Kitchen atorvastatin (LIPITOR) 80 MG tablet Take 80 mg by mouth daily.     . budesonide-formoterol (SYMBICORT) 160-4.5 MCG/ACT inhaler Inhale 2 puffs into the lungs 2  (two) times daily. 1 Inhaler 3  . buPROPion (WELLBUTRIN SR) 150 MG 12 hr tablet Take 150 mg by mouth 2 (two) times daily.    . clopidogrel (PLAVIX) 75 MG tablet Take 75 mg by mouth daily.   5  . famotidine (PEPCID) 20 MG tablet Take 20 mg by mouth daily.   10  . ipratropium-albuterol (DUONEB) 0.5-2.5 (3) MG/3ML SOLN Take 3 mLs by nebulization every 6 (six) hours as needed (for shortness of breath).    . Linaclotide (LINZESS) 145 MCG CAPS capsule Take 145 mcg by mouth daily as needed (for constipation).     Marland Kitchen omega-3 acid ethyl esters (LOVAZA) 1 g capsule Take 1 g by mouth daily.     . pantoprazole (PROTONIX) 40 MG tablet Take 1 tablet (40 mg total) by mouth daily. 30 tablet 2  . saxagliptin HCl (ONGLYZA) 5 MG TABS tablet Take 5 mg by mouth daily. Reported on 09/22/2015    . SPIRIVA HANDIHALER 18 MCG inhalation capsule Take 1 puff by mouth daily.    Marland Kitchen torsemide (DEMADEX) 20 MG tablet Take 1 tablet (20 mg total) by mouth daily. Take 1 tablet on Monday, Wednesday, and Friday only. 90 tablet 3  . ferrous sulfate 325 (65 FE) MG EC tablet Take 1 tablet (325 mg total) by mouth 2 (two) times  daily. 60 tablet 2   No current facility-administered medications for this visit.      Past Surgical History:  Procedure Laterality Date  . ABDOMINAL HYSTERECTOMY    . colonoscopy  2009   Dr. Gala Romney: cluster of diminutive rectal polyps and 2 diminutive polyps in rectosigmoid junction, hyperplastic  . COLONOSCOPY WITH PROPOFOL N/A 10/18/2017   Procedure: COLONOSCOPY WITH PROPOFOL;  Surgeon: Daneil Dolin, MD;  [facility];  Service: Endoscopy;  Laterality: N/A;  9:30am  . ESOPHAGOGASTRODUODENOSCOPY N/A 01/25/2018   Procedure: ESOPHAGOGASTRODUODENOSCOPY (EGD);  Surgeon: Danie Binder, MD;  [facility];  Service: Endoscopy;  Laterality: N/A;  . ESOPHAGOGASTRODUODENOSCOPY (EGD) WITH PROPOFOL N/A 08/29/2017   Dr. Gala Romney with propofol: small hiatal hernia. 4cm fundal diverticulum.   Marland Kitchen GIVENS  CAPSULE STUDY N/A 01/25/2018   Procedure: GIVENS CAPSULE STUDY;  Surgeon: Danie Binder, MD;  [facility];  Service: Endoscopy;  Laterality: N/A;  . left hand middle finger     in an accident at work, amputated     No Known Allergies    Family History  Problem Relation Age of Onset  . CVA Mother 33  . Cancer Neg Hx   . Colon cancer Neg Hx      Social History Erica [name] reports that she has been smoking cigarettes. She has a 13.00 pack-year smoking history. She has never used smokeless tobacco. Erica [name] reports no history of alcohol use.   Review of Systems CONSTITUTIONAL: No weight loss, fever, chills, weakness or fatigue.  HEENT: Eyes: No visual loss, blurred vision, double vision or yellow sclerae.No hearing loss, sneezing, congestion, runny nose or sore throat.  SKIN: No rash or itching.  CARDIOVASCULAR: per hpi RESPIRATORY: per hpi GASTROINTESTINAL: No anorexia, nausea, vomiting or diarrhea. No abdominal pain or blood.  GENITOURINARY: No burning on urination, no polyuria NEUROLOGICAL: No headache, dizziness, syncope, paralysis, ataxia, numbness or tingling in the extremities. No change in bowel or bladder control.  MUSCULOSKELETAL: No muscle, back pain, joint pain or stiffness.  LYMPHATICS: No enlarged nodes. No history of splenectomy.  PSYCHIATRIC: No history of depression or anxiety.  ENDOCRINOLOGIC: No reports of sweating, cold or heat intolerance. No polyuria or polydipsia.  Marland Kitchen   Physical Examination Vitals:   06/20/19 1136  BP: (!) 114/59  Pulse: 82  Temp: (!) 97.3 F (36.3 C)   Filed Weights   06/20/19 1136  Weight: 150 lb (68 kg)    Gen: resting comfortably, no acute distress HEENT: no scleral icterus, pupils equal round and reactive, no palptable cervical adenopathy,  CV: RRR, no m/r/g, no jvd Resp: mild expiratory wheezing.  GI: abdomen is soft, non-tender, non-distended, normal bowel sounds, no hepatosplenomegaly MSK: extremities  are warm, no edema.  Skin: warm, no rash Neuro:  no focal deficits Psych: appropriate affect   Diagnostic Studies  11/2017 echo Study Conclusions  - Limited echo to evaluate LV function. - Left ventricle: The cavity size was normal. Wall thickness was normal. Systolic function was normal. The estimated ejection fraction was in the range of 60% to 65%. Wall motion was normal; there were no regional wall motion abnormalities. - Aortic valve: Valve area (VTI): 2.17 cm^2. Valve area (Vmax): 2.14 cm^2. - Mitral valve: There was mild regurgitation.   08/2018 echo Study Conclusions  - Left ventricle: The cavity size was normal. Wall thickness was increased in a pattern of mild LVH. Systolic function was normal. The estimated ejection fraction was 65%. Wall motion was  normal; there were no regional wall motion abnormalities. Findings consistent with left ventricular diastolic dysfunction, grade indeterminate. Doppler parameters are consistent with high ventricular filling pressure. - Mitral valve: Mildly thickened leaflets . Mild anterior leaflet prolapse. There was trivial regurgitation. - Left atrium: The atrium was moderately dilated.   Assessment and Plan   1. Chronic diastolic HF -no signs of fluid overload, actually has lost weight over the last few months - recent SOB related to COPD, she reports recent prednisone course by pcp - continue current diuretic.   2. Hyperkalemia - recent admission with hyperkalemia, repeat BMET since discharge.   3. HTN - at goal, continue current meds   4. Hyperlipidemia - request pcp labs, contineu statin  5. CKD IV - repeat labs     Arnoldo Lenis, M.D.

## 2019-06-20 NOTE — Patient Instructions (Signed)
Medication Instructions:  Your physician recommends that you continue on your current medications as directed. Please refer to the Current Medication list given to you today.   Labwork: TODAY  BMET MAGNESIUM   I WILL REQUEST LABS FROM PCP   Testing/Procedures: NONE  Follow-Up: Your physician wants you to follow-up in: 6 MONTHS. You will receive a reminder letter in the mail two months in advance. If you don't receive a letter, please call our office to schedule the follow-up appointment.   Any Other Special Instructions Will Be Listed Below (If Applicable).     If you need a refill on your cardiac medications before your next appointment, please call your pharmacy.

## 2019-06-24 DIAGNOSIS — N183 Chronic kidney disease, stage 3 (moderate): Secondary | ICD-10-CM | POA: Diagnosis not present

## 2019-06-24 DIAGNOSIS — Z7189 Other specified counseling: Secondary | ICD-10-CM | POA: Diagnosis not present

## 2019-06-24 DIAGNOSIS — N184 Chronic kidney disease, stage 4 (severe): Secondary | ICD-10-CM | POA: Diagnosis not present

## 2019-06-24 DIAGNOSIS — I10 Essential (primary) hypertension: Secondary | ICD-10-CM | POA: Diagnosis not present

## 2019-06-24 DIAGNOSIS — Z9981 Dependence on supplemental oxygen: Secondary | ICD-10-CM | POA: Diagnosis not present

## 2019-06-24 DIAGNOSIS — Z720 Tobacco use: Secondary | ICD-10-CM | POA: Diagnosis not present

## 2019-06-24 DIAGNOSIS — J449 Chronic obstructive pulmonary disease, unspecified: Secondary | ICD-10-CM | POA: Diagnosis not present

## 2019-06-24 DIAGNOSIS — I5032 Chronic diastolic (congestive) heart failure: Secondary | ICD-10-CM | POA: Diagnosis not present

## 2019-06-24 DIAGNOSIS — E1122 Type 2 diabetes mellitus with diabetic chronic kidney disease: Secondary | ICD-10-CM | POA: Diagnosis not present

## 2019-07-01 DIAGNOSIS — K068 Other specified disorders of gingiva and edentulous alveolar ridge: Secondary | ICD-10-CM | POA: Diagnosis not present

## 2019-07-01 DIAGNOSIS — K219 Gastro-esophageal reflux disease without esophagitis: Secondary | ICD-10-CM | POA: Diagnosis not present

## 2019-07-28 ENCOUNTER — Other Ambulatory Visit (HOSPITAL_COMMUNITY): Payer: Self-pay | Admitting: Family Medicine

## 2019-07-28 DIAGNOSIS — Z1231 Encounter for screening mammogram for malignant neoplasm of breast: Secondary | ICD-10-CM

## 2019-08-20 ENCOUNTER — Ambulatory Visit (HOSPITAL_COMMUNITY): Payer: Medicare HMO

## 2019-09-30 DIAGNOSIS — N183 Chronic kidney disease, stage 3 unspecified: Secondary | ICD-10-CM | POA: Diagnosis not present

## 2019-09-30 DIAGNOSIS — J449 Chronic obstructive pulmonary disease, unspecified: Secondary | ICD-10-CM | POA: Diagnosis not present

## 2019-09-30 DIAGNOSIS — E1122 Type 2 diabetes mellitus with diabetic chronic kidney disease: Secondary | ICD-10-CM | POA: Diagnosis not present

## 2019-09-30 DIAGNOSIS — I5032 Chronic diastolic (congestive) heart failure: Secondary | ICD-10-CM | POA: Diagnosis not present

## 2019-09-30 DIAGNOSIS — Z9981 Dependence on supplemental oxygen: Secondary | ICD-10-CM | POA: Diagnosis not present

## 2019-10-26 DIAGNOSIS — I5032 Chronic diastolic (congestive) heart failure: Secondary | ICD-10-CM | POA: Diagnosis not present

## 2019-10-26 DIAGNOSIS — E1122 Type 2 diabetes mellitus with diabetic chronic kidney disease: Secondary | ICD-10-CM | POA: Diagnosis not present

## 2019-10-26 DIAGNOSIS — I130 Hypertensive heart and chronic kidney disease with heart failure and stage 1 through stage 4 chronic kidney disease, or unspecified chronic kidney disease: Secondary | ICD-10-CM | POA: Diagnosis not present

## 2019-10-26 DIAGNOSIS — N184 Chronic kidney disease, stage 4 (severe): Secondary | ICD-10-CM | POA: Diagnosis not present

## 2019-10-31 DIAGNOSIS — D649 Anemia, unspecified: Secondary | ICD-10-CM | POA: Diagnosis not present

## 2019-10-31 DIAGNOSIS — K5790 Diverticulosis of intestine, part unspecified, without perforation or abscess without bleeding: Secondary | ICD-10-CM | POA: Diagnosis not present

## 2019-10-31 DIAGNOSIS — N184 Chronic kidney disease, stage 4 (severe): Secondary | ICD-10-CM | POA: Diagnosis not present

## 2019-10-31 DIAGNOSIS — R634 Abnormal weight loss: Secondary | ICD-10-CM | POA: Diagnosis not present

## 2019-10-31 DIAGNOSIS — R6881 Early satiety: Secondary | ICD-10-CM | POA: Diagnosis not present

## 2019-10-31 DIAGNOSIS — Z1389 Encounter for screening for other disorder: Secondary | ICD-10-CM | POA: Diagnosis not present

## 2019-10-31 DIAGNOSIS — Q605 Renal hypoplasia, unspecified: Secondary | ICD-10-CM | POA: Diagnosis not present

## 2019-10-31 DIAGNOSIS — E785 Hyperlipidemia, unspecified: Secondary | ICD-10-CM | POA: Diagnosis not present

## 2019-10-31 DIAGNOSIS — J849 Interstitial pulmonary disease, unspecified: Secondary | ICD-10-CM | POA: Diagnosis not present

## 2019-10-31 DIAGNOSIS — E1122 Type 2 diabetes mellitus with diabetic chronic kidney disease: Secondary | ICD-10-CM | POA: Diagnosis not present

## 2019-12-02 ENCOUNTER — Telehealth (INDEPENDENT_AMBULATORY_CARE_PROVIDER_SITE_OTHER): Payer: Medicare HMO | Admitting: Cardiology

## 2019-12-02 ENCOUNTER — Encounter: Payer: Self-pay | Admitting: Cardiology

## 2019-12-02 ENCOUNTER — Encounter: Payer: Self-pay | Admitting: *Deleted

## 2019-12-02 VITALS — Ht 64.0 in | Wt 150.0 lb

## 2019-12-02 DIAGNOSIS — E782 Mixed hyperlipidemia: Secondary | ICD-10-CM

## 2019-12-02 DIAGNOSIS — N184 Chronic kidney disease, stage 4 (severe): Secondary | ICD-10-CM

## 2019-12-02 DIAGNOSIS — I10 Essential (primary) hypertension: Secondary | ICD-10-CM

## 2019-12-02 DIAGNOSIS — I5032 Chronic diastolic (congestive) heart failure: Secondary | ICD-10-CM

## 2019-12-02 NOTE — Progress Notes (Signed)
Virtual Visit via Telephone Note   This visit type was conducted due to national recommendations for restrictions regarding the COVID-19 Pandemic (e.g. social distancing) in an effort to limit this patient's exposure and mitigate transmission in our community.  Due to her co-morbid illnesses, this patient is at least at moderate risk for complications without adequate follow up.  This format is felt to be most appropriate for this patient at this time.  The patient did not have access to video technology/had technical difficulties with video requiring transitioning to audio format only (telephone).  All issues noted in this document were discussed and addressed.  No physical exam could be performed with this format.  Please refer to the patient's chart for her  consent to telehealth for [facility].   The patient was identified using 2 identifiers.  Date:  12/02/2019   ID:  Erica [name], DOB [birth year], MRN [id number]  Patient Location: Home Provider Location: Office  PCP:  Iona Beard, MD  Cardiologist:  Carlyle Dolly, MD  Electrophysiologist:  None   Evaluation Performed:  Follow-Up Visit  Chief Complaint:  Follow up visit  History of Present Illness:    Erica [name] is a 69 y.o. female seen today for follow up of the following medical problems.   1. SOB - admit 11/2017 with fever, leukocytosis, severe hypoxia to 70s - mild trop elevation at that time, peak 0.53. Thought to be demand ischemia. Based on risk factors plans for outpatient stress pending symptoms.  - 11/2017 echo LVEF 60-65%, no WMAs    - some increased SOB. No recent edema. +cough. Has pulmonary f/u later this month.  - no orthopnea.   2. Chronic diastolic HF -19/4174 ech LVEF 65%, abnormal diasotlic function with elevated filling pressures  - no recent edema. Compliant with torsemide 20mg  daily.   3. COPD - on home O23L    4. Hypertension -home sbps 120s.    5.  Hyperlipidemia - compliant with statin - recent labs with pcp per her report   6. DM2 - followed by pcp   7. CKDIV - followed by pcp  8. PSVT - no recent palpitatoins  9 History of GI bleed - admit 01/2018 with GI bleed. Hgb 7.3 down from 9. Had heme +stools.  - EGD normal. Bleed thought due to AVMs. Transfused 2 units pRBC   10. Hyperkalemia -admit 04/2019  11. History of CVA - on ASA and plavix according to notes, not for any heart reason      The patient does not have symptoms concerning for COVID-19 infection (fever, chills, cough, or new shortness of breath).    Past Medical History:  Diagnosis Date  . Arthritis   . CHF (congestive heart failure) ([place])   . Chronic back pain   . COPD (chronic obstructive pulmonary disease) ([facility])    2L home O2  . Diabetes mellitus without complication ([place])   . Hyperlipidemia   . Hypertension   . Stroke (cerebrum) [facility])    Past Surgical History:  Procedure Laterality Date  . ABDOMINAL HYSTERECTOMY    . colonoscopy  2009   Dr. Gala Romney: cluster of diminutive rectal polyps and 2 diminutive polyps in rectosigmoid junction, hyperplastic  . COLONOSCOPY WITH PROPOFOL N/A 10/18/2017   Procedure: COLONOSCOPY WITH PROPOFOL;  Surgeon: Daneil Dolin, MD;  [facility];  Service: Endoscopy;  Laterality: N/A;  9:30am  . ESOPHAGOGASTRODUODENOSCOPY N/A 01/25/2018   Procedure: ESOPHAGOGASTRODUODENOSCOPY (EGD);  Surgeon: Erica Binder, MD;  [facility];  Service: Endoscopy;  Laterality: N/A;  . ESOPHAGOGASTRODUODENOSCOPY (EGD) WITH PROPOFOL N/A 08/29/2017   Dr. Gala Romney with propofol: small hiatal hernia. 4cm fundal diverticulum.   Marland Kitchen GIVENS CAPSULE STUDY N/A 01/25/2018   Procedure: GIVENS CAPSULE STUDY;  Surgeon: Erica Binder, MD;  [facility];  Service: Endoscopy;  Laterality: N/A;  . left hand middle finger     in an accident at work, amputated     No outpatient medications have been marked as  taking for the 12/02/19 encounter (Appointment) with Erica Lenis, MD.     Allergies:   Patient has no known allergies.   Social History   Tobacco Use  . Smoking status: Current Some Day Smoker    Packs/day: 0.25    Years: 52.00    Pack years: 13.00    Types: Cigarettes    Last attempt to quit: 09/23/2017    Years since quitting: 2.1  . Smokeless tobacco: Never Used  Substance Use Topics  . Alcohol use: No  . Drug use: No     Family Hx: The patient's family history includes CVA (age of onset: 48) in her mother. There is no history of Cancer or Colon cancer.  ROS:   Please see the history of present illness.     All other systems reviewed and are negative.   Prior CV studies:   The following studies were reviewed today:  11/2017 echo Study Conclusions  - Limited echo to evaluate LV function. - Left ventricle: The cavity size was normal. Wall thickness was normal. Systolic function was normal. The estimated ejection fraction was in the range of 60% to 65%. Wall motion was normal; there were no regional wall motion abnormalities. - Aortic valve: Valve area (VTI): 2.17 cm^2. Valve area (Vmax): 2.14 cm^2. - Mitral valve: There was mild regurgitation.   08/2018 echo Study Conclusions  - Left ventricle: The cavity size was normal. Wall thickness was increased in a pattern of mild LVH. Systolic function was normal. The estimated ejection fraction was 65%. Wall motion was normal; there were no regional wall motion abnormalities. Findings consistent with left ventricular diastolic dysfunction, grade indeterminate. Doppler parameters are consistent with high ventricular filling pressure. - Mitral valve: Mildly thickened leaflets . Mild anterior leaflet prolapse. There was trivial regurgitation. - Left atrium: The atrium was moderately dilated.  Labs/Other Tests and Data Reviewed:    EKG:  No ECG reviewed.  Recent Labs: 12/25/2018: B  Natriuretic Peptide 984.0 05/21/2019: ALT 7 05/22/2019: Magnesium 2.1 05/23/2019: BUN 34; Creatinine, Ser 2.81; Hemoglobin 11.5; Platelets 206; Potassium 4.8; Sodium 145   Recent Lipid Panel No results found for: CHOL, TRIG, HDL, CHOLHDL, LDLCALC, LDLDIRECT  Wt Readings from Last 3 Encounters:  06/20/19 150 lb (68 kg)  05/23/19 158 lb 11.7 oz (72 kg)  02/26/19 176 lb (79.8 kg)     Objective:    Vital Signs:   Today's Vitals   12/02/19 1149  Weight: 150 lb (68 kg)  Height: 5\' 4"  (1.626 m)   Body mass index is 25.75 kg/m. Normal affect. NOrmal speech pattern and tone. Comfortable, no apparent distress. No audible signs of SOB or wheezing.   ASSESSMENT & PLAN:    1. Chronic diastolic HF -no LE edema. Some SOB and coughing sounds most consistent with her COPD, weights are stable.  - continue current diuretic.   2. HTN - bp at goal, continue current meds   3. Hyperlipidemia -continue statin, request pcp labs  4. CKD IV - request pcp labs  COVID-19 Education: The signs and symptoms of COVID-19 were discussed with the patient and how to seek care for testing (follow up with PCP or arrange E-visit).  The importance of social distancing was discussed today.  Time:   Today, I have spent 23 minutes with the patient with telehealth technology discussing the above problems.     Medication Adjustments/Labs and Tests Ordered: Current medicines are reviewed at length with the patient today.  Concerns regarding medicines are outlined above.   Tests Ordered: No orders of the defined types were placed in this encounter.   Medication Changes: No orders of the defined types were placed in this encounter.   Follow Up:  Either In Person or Virtual in 6 month(s)  Signed, Carlyle Dolly, MD  12/02/2019 9:18 AM    [place]

## 2019-12-02 NOTE — Patient Instructions (Signed)

## 2019-12-05 DIAGNOSIS — J810 Acute pulmonary edema: Secondary | ICD-10-CM | POA: Diagnosis not present

## 2019-12-05 DIAGNOSIS — J449 Chronic obstructive pulmonary disease, unspecified: Secondary | ICD-10-CM | POA: Diagnosis not present

## 2019-12-05 DIAGNOSIS — E860 Dehydration: Secondary | ICD-10-CM | POA: Diagnosis not present

## 2019-12-05 DIAGNOSIS — J9621 Acute and chronic respiratory failure with hypoxia: Secondary | ICD-10-CM | POA: Diagnosis not present

## 2019-12-05 DIAGNOSIS — R109 Unspecified abdominal pain: Secondary | ICD-10-CM | POA: Diagnosis not present

## 2019-12-05 DIAGNOSIS — E1122 Type 2 diabetes mellitus with diabetic chronic kidney disease: Secondary | ICD-10-CM | POA: Diagnosis not present

## 2019-12-05 DIAGNOSIS — E874 Mixed disorder of acid-base balance: Secondary | ICD-10-CM | POA: Diagnosis not present

## 2019-12-05 DIAGNOSIS — Z8673 Personal history of transient ischemic attack (TIA), and cerebral infarction without residual deficits: Secondary | ICD-10-CM | POA: Diagnosis not present

## 2019-12-05 DIAGNOSIS — N170 Acute kidney failure with tubular necrosis: Secondary | ICD-10-CM | POA: Diagnosis not present

## 2019-12-05 DIAGNOSIS — Z20822 Contact with and (suspected) exposure to covid-19: Secondary | ICD-10-CM | POA: Diagnosis not present

## 2019-12-05 DIAGNOSIS — N179 Acute kidney failure, unspecified: Secondary | ICD-10-CM | POA: Diagnosis not present

## 2019-12-05 DIAGNOSIS — Z8249 Family history of ischemic heart disease and other diseases of the circulatory system: Secondary | ICD-10-CM | POA: Diagnosis not present

## 2019-12-05 DIAGNOSIS — I959 Hypotension, unspecified: Secondary | ICD-10-CM | POA: Diagnosis not present

## 2019-12-05 DIAGNOSIS — N183 Chronic kidney disease, stage 3 unspecified: Secondary | ICD-10-CM | POA: Diagnosis not present

## 2019-12-05 DIAGNOSIS — J9622 Acute and chronic respiratory failure with hypercapnia: Secondary | ICD-10-CM | POA: Diagnosis not present

## 2019-12-05 DIAGNOSIS — I5032 Chronic diastolic (congestive) heart failure: Secondary | ICD-10-CM | POA: Diagnosis not present

## 2019-12-05 DIAGNOSIS — E785 Hyperlipidemia, unspecified: Secondary | ICD-10-CM | POA: Diagnosis not present

## 2019-12-05 DIAGNOSIS — I130 Hypertensive heart and chronic kidney disease with heart failure and stage 1 through stage 4 chronic kidney disease, or unspecified chronic kidney disease: Secondary | ICD-10-CM | POA: Diagnosis not present

## 2019-12-19 DIAGNOSIS — Q605 Renal hypoplasia, unspecified: Secondary | ICD-10-CM | POA: Diagnosis not present

## 2019-12-19 DIAGNOSIS — E1140 Type 2 diabetes mellitus with diabetic neuropathy, unspecified: Secondary | ICD-10-CM | POA: Diagnosis not present

## 2019-12-19 DIAGNOSIS — D631 Anemia in chronic kidney disease: Secondary | ICD-10-CM | POA: Diagnosis not present

## 2019-12-19 DIAGNOSIS — N184 Chronic kidney disease, stage 4 (severe): Secondary | ICD-10-CM | POA: Diagnosis not present

## 2019-12-19 DIAGNOSIS — I129 Hypertensive chronic kidney disease with stage 1 through stage 4 chronic kidney disease, or unspecified chronic kidney disease: Secondary | ICD-10-CM | POA: Diagnosis not present

## 2019-12-19 DIAGNOSIS — I10 Essential (primary) hypertension: Secondary | ICD-10-CM | POA: Diagnosis not present

## 2019-12-22 DIAGNOSIS — E1122 Type 2 diabetes mellitus with diabetic chronic kidney disease: Secondary | ICD-10-CM | POA: Diagnosis not present

## 2019-12-22 DIAGNOSIS — N189 Chronic kidney disease, unspecified: Secondary | ICD-10-CM | POA: Diagnosis not present

## 2019-12-22 DIAGNOSIS — J449 Chronic obstructive pulmonary disease, unspecified: Secondary | ICD-10-CM | POA: Diagnosis not present

## 2019-12-22 DIAGNOSIS — Z9981 Dependence on supplemental oxygen: Secondary | ICD-10-CM | POA: Diagnosis not present

## 2019-12-22 DIAGNOSIS — Z6823 Body mass index (BMI) 23.0-23.9, adult: Secondary | ICD-10-CM | POA: Diagnosis not present

## 2019-12-29 NOTE — Progress Notes (Signed)
[facility]  [address] [place], [place] [address] Phone: [phone]  Fax: [phone]   Clinic Day:  12/31/2019  Referring physician: Elouise Munroe, MD  Chief Complaint: Erica [name] is a 69 y.o. female with stage IV chronic kidney disease and iron deficiency who is referred in consultation with Dr. Trinda Pascal for assessment and management.   HPI:  The patient was initially seen by Dr Smith Mince on 12/19/2019 for evaluation of chronic kidney disease.  She states that she has had a kidney problem for 2-3 years.  She has hypertension and diabetes.  She notes decreased urine output.  She has lost 20-25 pounds in the past year unintentionally.  She was recently been admitted to [facility] for COPD and shortness of breath.  She is on 3 liters/min of oxygen via [place] chronically.  She is on oral iron BID.  Labs on 05/23/2019 revealed a creatinine of 2.81.  Hemoglobin was 11.5.  SPEP and UPEP were negative on 05/22/2019.  Hemoglobin was 8.7 and creatine 2.8 (CrCl 19 ml/min) on 10/31/2019.  TSH was normal.  Renal ultrasound on 05/21/2019 revealed no hydronephrosis.  She has a single left kidney and a small right kidney.  Labs on 12/19/2019 revealed a BUN 60, creatinine 3.84, potassium 5.9.  Ferritin was 35.7 with an iron saturation of 7% and a TIBC 321.9.  SPEP revealed an irregularity in the gamma region.  Immunofixation revealed no monoclonal protein.  Kappa free light chains were 6.68, lambda free light chains 3.40 and ratio 1.96 (0.26 - 1.65).  She has a history of a GI bleed.  She was admitted to [facility] from 01/23/2018 - 01/25/2018 with a hemoglobin of 7.3 and dark stools on oral iron.  She received 2 units of PRBCs.  EGD on 05/03/20219 revealed a normal esophagus and stomach.  There were 2 small angioectasias without bleeding in the duodenal bulb and the first part of the duodenum.  A Givens capsule study was released. AVMs were felt the etiology of  her iron deficiency.  Discharge hematocrit was 10.2.  Colonoscopy on 01/24/20219 revealed a suboptimal preparation.  There was diverticulosis in the sigmoid and descending colon.  She has been taking oral iron for 3-4 years. She does not follow up with Dr. Berdine Addison, her gastroenterologist. She left [facility] and now goes to [facility]. She does not know who will be her new GI doctor.  She denies any melena, hematochezia or hematuria.  She had a hysterectomy 20-30 years ago.  Symptomatically, she describes frequent nausea.  She sometimes struggles to eat. She has a small appetite.  She eats meat every other day; she likes turnip greens. She has had ice pica for a long time. She has restless legs.   She is on 3 L/min of oxygen.  She has shortness of breath on exertion.  She often gets headaches.  She has hand cramps at times. Last mammogram was in 2009; she would like to be set up for one. She examines her breasts monthly and has not noticed anything abnormality.    Past Medical History:  Diagnosis Date   Arthritis    CHF (congestive heart failure) (HCC)    Chronic back pain    COPD (chronic obstructive pulmonary disease) (HCC)    2L home O2   Diabetes mellitus without complication (HCC)    Hyperlipidemia    Hypertension    Stroke (cerebrum) [facility])     Past Surgical History:  Procedure Laterality  Date   ABDOMINAL HYSTERECTOMY     colonoscopy  2009   Dr. Gala Romney: cluster of diminutive rectal polyps and 2 diminutive polyps in rectosigmoid junction, hyperplastic   COLONOSCOPY WITH PROPOFOL N/A 10/18/2017   Procedure: COLONOSCOPY WITH PROPOFOL;  Surgeon: Daneil Dolin, MD;  [facility];  Service: Endoscopy;  Laterality: N/A;  9:30am   ESOPHAGOGASTRODUODENOSCOPY N/A 01/25/2018   Procedure: ESOPHAGOGASTRODUODENOSCOPY (EGD);  Surgeon: Danie Binder, MD;  Location: [facility];  Service: Endoscopy;  Laterality: N/A;   ESOPHAGOGASTRODUODENOSCOPY (EGD) WITH PROPOFOL  N/A 08/29/2017   Dr. Gala Romney with propofol: small hiatal hernia. 4cm fundal diverticulum.    GIVENS CAPSULE STUDY N/A 01/25/2018   Procedure: GIVENS CAPSULE STUDY;  Surgeon: Danie Binder, MD;  Location: [facility];  Service: Endoscopy;  Laterality: N/A;   left hand middle finger     in an accident at work, amputated    Family History  Problem Relation Age of Onset   CVA Mother 16   Cancer Neg Hx    Colon cancer Neg Hx     Social History:  reports that she has been smoking cigarettes. She has a 13.00 pack-year smoking history. She has never used smokeless tobacco. She reports that she does not drink alcohol or use drugs.  She smoked 1-1.5 packs/day x 20 years. She stopped smoking 3-4 months ago (early 2021). Her sister's name is Erica [name]. The patient is accompanied by her sister, Erica [name], and her niece, Erica [name], on the Ipad today.  Allergies: No Known Allergies  Current Medications: Current Outpatient Medications  Medication Sig Dispense Refill   amLODipine (NORVASC) 10 MG tablet      aspirin EC 81 MG tablet Take 162 mg by mouth daily.     atorvastatin (LIPITOR) 80 MG tablet Take 80 mg by mouth daily.      budesonide-formoterol (SYMBICORT) 160-4.5 MCG/ACT inhaler Inhale 2 puffs into the lungs 2 (two) times daily. 1 Inhaler 3   buPROPion (WELLBUTRIN SR) 150 MG 12 hr tablet Take 150 mg by mouth 2 (two) times daily.     carvedilol (COREG) 12.5 MG tablet      clopidogrel (PLAVIX) 75 MG tablet Take 75 mg by mouth daily.   5   famotidine (PEPCID) 20 MG tablet Take 20 mg by mouth daily.   10   ipratropium-albuterol (DUONEB) 0.5-2.5 (3) MG/3ML SOLN Take 3 mLs by nebulization every 6 (six) hours as needed (for shortness of breath).     Linaclotide (LINZESS) 145 MCG CAPS capsule Take 145 mcg by mouth daily as needed (for constipation).      nicotine (NICODERM CQ - DOSED IN MG/24 HOURS) 14 mg/24hr patch      omega-3 acid ethyl esters (LOVAZA) 1 g capsule Take 1 g by mouth daily.       pantoprazole (PROTONIX) 40 MG tablet Take 1 tablet (40 mg total) by mouth daily. 30 tablet 2   saxagliptin HCl (ONGLYZA) 5 MG TABS tablet Take 5 mg by mouth daily. Reported on 09/22/2015     SPIRIVA HANDIHALER 18 MCG inhalation capsule Take 1 puff by mouth daily.     torsemide (DEMADEX) 20 MG tablet Take 1 tablet (20 mg total) by mouth daily. Take 1 tablet on Monday, Wednesday, and Friday only. 90 tablet 3   valsartan-hydrochlorothiazide (DIOVAN-HCT) 160-25 MG tablet      ferrous sulfate 325 (65 FE) MG EC tablet Take 1 tablet (325 mg total) by mouth 2 (two) times daily. 60 tablet 2  No current facility-administered medications for this visit.    Review of Systems  Constitutional: Negative.  Negative for chills, diaphoresis, fever, malaise/fatigue and weight loss.  HENT: Negative for congestion, ear pain, nosebleeds, sinus pain and sore throat.        Runny nose at times.  Eyes: Negative for blurred vision and double vision.  Respiratory: Positive for shortness of breath (exertional). Negative for cough and wheezing.   Cardiovascular: Negative for chest pain, palpitations and leg swelling.  Gastrointestinal: Positive for nausea. Negative for abdominal pain, blood in stool, constipation, diarrhea, heartburn, melena and vomiting.  Genitourinary: Negative for frequency and urgency.  Musculoskeletal: Positive for myalgias (hands cramp at times). Negative for back pain, falls, joint pain and neck pain.  Skin: Negative.  Negative for rash.  Neurological: Positive for headaches (intermittent). Negative for dizziness, tingling, tremors, sensory change, speech change, focal weakness and weakness.  Psychiatric/Behavioral: Negative for depression. The patient is not nervous/anxious and does not have insomnia.   All other systems reviewed and are negative.  Performance status (ECOG):  1  Vitals Blood pressure (!) 110/54, pulse 64, temperature (!) 97.2 F (36.2 C), temperature source  Tympanic, resp. rate 18, weight 141 lb 1.5 oz (64 kg), SpO2 98 %.   Physical Exam  Constitutional: She is oriented to person, place, and time. She appears well-developed and well-nourished. No distress. Face mask in place.  HENT:  Head: Normocephalic and atraumatic.  Right Ear: Hearing normal.  Left Ear: Hearing normal.  Mouth/Throat: Oropharynx is clear and moist and mucous membranes are normal. No oral lesions.  Short brown wig.  Upper dentures.  Oxygen via [place].  Eyes: Pupils are equal, round, and reactive to light. Conjunctivae and EOM are normal.  Ruddy sclera.  Neck: No JVD present.  Cardiovascular: Normal rate, regular rhythm and normal heart sounds. Exam reveals no gallop and no friction rub.  No murmur heard. Pulmonary/Chest: Effort normal and breath sounds normal. She has no wheezes. She has no rhonchi. She has no rales.  Intermittent squeak.  Abdominal: Soft. Normal appearance and bowel sounds are normal. She exhibits no distension and no mass. There is no hepatosplenomegaly. There is abdominal tenderness (slight). There is no rebound, no guarding and no CVA tenderness.  Musculoskeletal:        General: No tenderness. Normal range of motion.     Cervical back: Normal range of motion and neck supple.  Lymphadenopathy:       Head (right side): No preauricular, no posterior auricular and no occipital adenopathy present.       Head (left side): No preauricular, no posterior auricular and no occipital adenopathy present.    She has no cervical adenopathy.    She has no axillary adenopathy.       Right: No inguinal and no supraclavicular adenopathy present.       Left: No inguinal and no supraclavicular adenopathy present.  Neurological: She is alert and oriented to person, place, and time.  Skin: Skin is warm, dry and intact. No bruising, no lesion and no rash noted. No erythema. No pallor.  Psychiatric: She has a normal mood and affect. Her behavior is normal. Judgment and thought  content normal.    No visits with results within 3 Day(s) from this visit.  Latest known visit with results is:  Admission on 05/21/2019, Discharged on 05/23/2019  Component Date Value Ref Range Status   Sodium 05/21/2019 140  135 - 145 mmol/L Final   Potassium 05/21/2019 6.2* 3.5 -  5.1 mmol/L Final   Chloride 05/21/2019 114* 98 - 111 mmol/L Final   CO2 05/21/2019 21* 22 - 32 mmol/L Final   Glucose, Bld 05/21/2019 108* 70 - 99 mg/dL Final   BUN 05/21/2019 38* 8 - 23 mg/dL Final   Creatinine, Ser 05/21/2019 3.32* 0.44 - 1.00 mg/dL Final   Calcium 05/21/2019 8.4* 8.9 - 10.3 mg/dL Final   Total Protein 05/21/2019 6.9  6.5 - 8.1 g/dL Final   Albumin 05/21/2019 3.9  3.5 - 5.0 g/dL Final   AST 05/21/2019 7* 15 - 41 U/L Final   ALT 05/21/2019 7  0 - 44 U/L Final   Alkaline Phosphatase 05/21/2019 60  38 - 126 U/L Final   Total Bilirubin 05/21/2019 0.3  0.3 - 1.2 mg/dL Final   GFR calc non Af Amer 05/21/2019 13* >60 mL/min Final   GFR calc Af Amer 05/21/2019 15* >60 mL/min Final   Anion gap 05/21/2019 5  5 - 15 Final   Performed at [facility], [address] [place], [place] [address]   WBC 05/21/2019 4.0  4.0 - 10.5 K/uL Final   RBC 05/21/2019 4.65  3.87 - 5.11 MIL/uL Final   Hemoglobin 05/21/2019 12.5  12.0 - 15.0 g/dL Final   HCT 05/21/2019 42.0  36.0 - 46.0 % Final   MCV 05/21/2019 90.3  80.0 - 100.0 fL Final   MCH 05/21/2019 26.9  26.0 - 34.0 pg Final   MCHC 05/21/2019 29.8* 30.0 - 36.0 g/dL Final   RDW 05/21/2019 15.0  11.5 - 15.5 % Final   Platelets 05/21/2019 218  150 - 400 K/uL Final   nRBC 05/21/2019 0.0  0.0 - 0.2 % Final   Neutrophils Relative % 05/21/2019 58  % Final   Neutro Abs 05/21/2019 2.3  1.7 - 7.7 K/uL Final   Lymphocytes Relative 05/21/2019 27  % Final   Lymphs Abs 05/21/2019 1.1  0.7 - 4.0 K/uL Final   Monocytes Relative 05/21/2019 10  % Final   Monocytes Absolute 05/21/2019 0.4  0.1 - 1.0 K/uL Final   Eosinophils Relative  05/21/2019 4  % Final   Eosinophils Absolute 05/21/2019 0.2  0.0 - 0.5 K/uL Final   Basophils Relative 05/21/2019 1  % Final   Basophils Absolute 05/21/2019 0.0  0.0 - 0.1 K/uL Final   Immature Granulocytes 05/21/2019 0  % Final   Abs Immature Granulocytes 05/21/2019 0.01  0.00 - 0.07 K/uL Final   Performed at [facility], [address] [place], [place] [address]   Magnesium 05/21/2019 2.0  1.7 - 2.4 mg/dL Final   Performed at [facility], [address] [place], [place] [address]   Color, Urine 05/21/2019 YELLOW  YELLOW Final   APPearance 05/21/2019 CLEAR  CLEAR Final   Specific Gravity, Urine 05/21/2019 1.011  1.005 - 1.030 Final   pH 05/21/2019 5.0  5.0 - 8.0 Final   Glucose, UA 05/21/2019 NEGATIVE  NEGATIVE mg/dL Final   Hgb urine dipstick 05/21/2019 NEGATIVE  NEGATIVE Final   Bilirubin Urine 05/21/2019 NEGATIVE  NEGATIVE Final   Ketones, ur 05/21/2019 NEGATIVE  NEGATIVE mg/dL Final   Protein, ur 05/21/2019 NEGATIVE  NEGATIVE mg/dL Final   Nitrite 05/21/2019 NEGATIVE  NEGATIVE Final   Leukocytes,Ua 05/21/2019 NEGATIVE  NEGATIVE Final   Performed at [facility], [address] [place],  [address]   SARS Coronavirus 2 05/21/2019 NEGATIVE  NEGATIVE Final   Comment: (NOTE) SARS-CoV-2 target nucleic acids are NOT DETECTED. The SARS-CoV-2 RNA is generally detectable in  upper and lower respiratory specimens during the acute phase of infection. Negative results do not preclude SARS-CoV-2 infection, do not rule out co-infections with other pathogens, and should not be used as the sole basis for treatment or other patient management decisions. Negative results must be combined with clinical observations, patient history, and epidemiological information. The expected result is Negative. Fact Sheet for Patients: SugarRoll.be Fact Sheet for Healthcare Providers: https://www.woods-mathews.com/ This test is not yet approved  or cleared by the [place] FDA and  has been authorized for detection and/or diagnosis of SARS-CoV-2 by FDA under an Emergency Use Authorization (EUA). This EUA will remain  in effect (meaning this test can be used) for the duration of the COVID-19 declaration under Section 56                          4(b)(1) of the Act, 21 U.S.C. section 360bbb-3(b)(1), unless the authorization is terminated or revoked sooner. Performed at [facility], [place] [address], [place], [place] [address]    Glucose-Capillary 05/21/2019 84  70 - 99 mg/dL Final   Sodium 05/21/2019 139  135 - 145 mmol/L Final   Potassium 05/21/2019 5.8* 3.5 - 5.1 mmol/L Final   Chloride 05/21/2019 112* 98 - 111 mmol/L Final   CO2 05/21/2019 22  22 - 32 mmol/L Final   Glucose, Bld 05/21/2019 47* 70 - 99 mg/dL Final   BUN 05/21/2019 37* 8 - 23 mg/dL Final   Creatinine, Ser 05/21/2019 3.27* 0.44 - 1.00 mg/dL Final   Calcium 05/21/2019 8.3* 8.9 - 10.3 mg/dL Final   GFR calc non Af Amer 05/21/2019 14* >60 mL/min Final   GFR calc Af Amer 05/21/2019 16* >60 mL/min Final   Anion gap 05/21/2019 5  5 - 15 Final   Performed at [facility], [address], [place], [place] [address]   HIV Screen 4th Generation wRfx 05/21/2019 Non Reactive  Non Reactive Final   Comment: (NOTE) Performed At: [facility] [place], [place] [id number] Rush Farmer MD [id number]    Hgb A1c MFr Bld 05/21/2019 4.2* 4.8 - 5.6 % Final   Comment: REPEATED TO VERIFY (NOTE) Pre diabetes:          5.7%-6.4% Diabetes:              >6.4% Glycemic control for   <7.0% adults with diabetes    Mean Plasma Glucose 05/21/2019 73.84  mg/dL Final   Performed at [facility], [place] [address] [place], [place] [address]   Sodium, Ur 05/21/2019 88  mmol/L Final   Performed at [facility], [address] [place], [place] [address]   Creatinine, Urine 05/21/2019 130.39  mg/dL Final   Performed at [facility], [address], [place], [place] [address]   Immunofixation, Urine 05/21/2019 Comment   Final   Comment: (NOTE) The immunofixation pattern appears unremarkable. Evidence of monoclonal protein is not apparent. Performed At: [facility] [place], [place] [id number] Rush Farmer MD [id number]    Total Protein ELP 05/21/2019 5.8* 6.0 - 8.5 g/dL Final   IgG (Immunoglobin G), Serum 05/21/2019 722  586 - 1,602 mg/dL Final   IgA 05/21/2019 227  87 - 352 mg/dL Final   IgM (Immunoglobulin M), Srm 05/21/2019 46  26 - 217 mg/dL Final   Comment: (NOTE) Performed At: [facility] [address] [place], [place] [id number] Rush Farmer MD PJ:[id number]    Immunofixation  Result, Serum 05/21/2019 Comment   Corrected   Comment: (NOTE) The immunofixation pattern appears unremarkable. Evidence of monoclonal protein is not apparent.    Kappa free light chain 05/21/2019 43.4* 3.3 - 19.4 mg/L Final   Lamda free light chains 05/21/2019 22.9  5.7 - 26.3 mg/L Final   Kappa, lamda light chain ratio 05/21/2019 1.90* 0.26 - 1.65 Final   Comment: (NOTE) Performed At: [facility] [place], [place] [id number] Rush Farmer MD UX:[id number]    Magnesium 05/22/2019 2.1  1.7 - 2.4 mg/dL Final   Performed at [facility], [address] [place], [place] [address]   Sodium 05/22/2019 141  135 - 145 mmol/L Final   Potassium 05/22/2019 6.0* 3.5 - 5.1 mmol/L Final   Chloride 05/22/2019 113* 98 - 111 mmol/L Final   CO2 05/22/2019 22  22 - 32 mmol/L Final   Glucose, Bld 05/22/2019 92  70 - 99 mg/dL Final   BUN 05/22/2019 36* 8 - 23 mg/dL Final   Creatinine, Ser 05/22/2019 3.09* 0.44 - 1.00 mg/dL Final   Calcium 05/22/2019 8.1* 8.9 - 10.3 mg/dL Final   GFR calc non Af Amer 05/22/2019 15* >60 mL/min Final   GFR calc Af Amer 05/22/2019 17* >60 mL/min Final   Anion gap 05/22/2019 6  5 - 15 Final   Performed at [facility], [address], [place], [place] [address]   WBC 05/22/2019 4.8  4.0 - 10.5 K/uL Final   RBC 05/22/2019 4.35  3.87 - 5.11 MIL/uL Final   Hemoglobin 05/22/2019 11.6* 12.0 - 15.0 g/dL Final   HCT 05/22/2019 39.5  36.0 - 46.0 % Final   MCV 05/22/2019 90.8  80.0 - 100.0 fL Final   MCH 05/22/2019 26.7  26.0 - 34.0 pg Final   MCHC 05/22/2019 29.4* 30.0 - 36.0 g/dL Final   RDW 05/22/2019 14.7  11.5 - 15.5 % Final   Platelets 05/22/2019 196  150 - 400 K/uL Final   nRBC 05/22/2019 0.6* 0.0 - 0.2 % Final   Performed at [facility], [address] [place], [place] [address]   Glucose-Capillary 05/21/2019 84  70 - 99 mg/dL Final   Glucose-Capillary 05/21/2019 86  70 - 99 mg/dL Final   Glucose-Capillary 05/22/2019 98  70 - 99 mg/dL Final   Sodium 05/22/2019 141  135 - 145 mmol/L Final   Potassium 05/22/2019 6.1* 3.5 - 5.1 mmol/L Final   Chloride 05/22/2019 115* 98 - 111 mmol/L Final   CO2 05/22/2019 18* 22 - 32 mmol/L Final   Glucose, Bld 05/22/2019 86  70 - 99 mg/dL Final   BUN 05/22/2019 37* 8 - 23 mg/dL Final   Creatinine, Ser 05/22/2019 3.21* 0.44 - 1.00 mg/dL Final   Calcium 05/22/2019 8.0* 8.9 - 10.3 mg/dL Final   Phosphorus 05/22/2019 6.1* 2.5 - 4.6 mg/dL Final   Albumin 05/22/2019 3.4* 3.5 - 5.0 g/dL Final   GFR calc non Af Amer 05/22/2019 14* >60 mL/min Final   GFR calc Af Amer 05/22/2019 16* >60 mL/min Final   Anion gap 05/22/2019 8  5 - 15 Final   Performed at [facility], [address] [place], [place] [address]   Glucose-Capillary 05/22/2019 93  70 - 99 mg/dL Final   Total Protein ELP 05/22/2019 5.8* 6.0 - 8.5 g/dL Final   Albumin ELP 05/22/2019 3.6  2.9 - 4.4 g/dL Final   Alpha-1-Globulin 05/22/2019 0.2  0.0 - 0.4 g/dL Final   Alpha-2-Globulin 05/22/2019 0.6  0.4 - 1.0  g/dL Final   Beta Globulin 05/22/2019 0.8  0.7 - 1.3 g/dL Final   Gamma Globulin 05/22/2019 0.5  0.4 - 1.8 g/dL Final   M-Spike, % 05/22/2019 Not Observed  Not Observed g/dL Final   SPE  Interp. 05/22/2019 Comment   Final   Comment: (NOTE) The SPE pattern appears unremarkable. Evidence of monoclonal protein is not apparent. Performed At: [facility] [place], [place] [id number] Rush Farmer MD DZ:[id number]    Comment 05/22/2019 Comment   Final   Comment: (NOTE) Protein electrophoresis scan will follow via computer, mail, or courier delivery.    Globulin, Total 05/22/2019 2.2  2.2 - 3.9 g/dL Corrected   A/G Ratio 05/22/2019 1.6  0.7 - 1.7 Corrected   Kappa free light chain 05/22/2019 37.8* 3.3 - 19.4 mg/L Final   Lamda free light chains 05/22/2019 21.4  5.7 - 26.3 mg/L Final   Kappa, lamda light chain ratio 05/22/2019 1.77* 0.26 - 1.65 Final   Comment: (NOTE) Performed At: [facility] [place], [place] [id number] Rush Farmer MD [id number]    Immunofixation, Urine 05/22/2019 Comment   Final   Comment: (NOTE) The immunofixation pattern appears unremarkable. Evidence of monoclonal protein is not apparent. Performed At: [facility] [place], [place] [id number] Rush Farmer MD DJ:[id number]    Glucose-Capillary 05/22/2019 99  70 - 99 mg/dL Final   Sodium 05/23/2019 145  135 - 145 mmol/L Final   Potassium 05/23/2019 4.8  3.5 - 5.1 mmol/L Final   DELTA CHECK NOTED   Chloride 05/23/2019 110  98 - 111 mmol/L Final   CO2 05/23/2019 26  22 - 32 mmol/L Final   Glucose, Bld 05/23/2019 92  70 - 99 mg/dL Final   BUN 05/23/2019 34* 8 - 23 mg/dL Final   Creatinine, Ser 05/23/2019 2.81* 0.44 - 1.00 mg/dL Final   Calcium 05/23/2019 8.0* 8.9 - 10.3 mg/dL Final   Phosphorus 05/23/2019 5.5* 2.5 - 4.6 mg/dL Final   Albumin 05/23/2019 3.5  3.5 - 5.0 g/dL Final   GFR calc non Af Amer 05/23/2019 16* >60 mL/min Final   GFR calc Af Amer 05/23/2019 19* >60 mL/min Final   Anion gap 05/23/2019 9  5 - 15 Final   Performed at [facility], [address], [place], [place] [address]   WBC  05/23/2019 3.8* 4.0 - 10.5 K/uL Final   RBC 05/23/2019 4.31  3.87 - 5.11 MIL/uL Final   Hemoglobin 05/23/2019 11.5* 12.0 - 15.0 g/dL Final   HCT 05/23/2019 38.7  36.0 - 46.0 % Final   MCV 05/23/2019 89.8  80.0 - 100.0 fL Final   MCH 05/23/2019 26.7  26.0 - 34.0 pg Final   MCHC 05/23/2019 29.7* 30.0 - 36.0 g/dL Final   RDW 05/23/2019 14.6  11.5 - 15.5 % Final   Platelets 05/23/2019 206  150 - 400 K/uL Final   nRBC 05/23/2019 0.0  0.0 - 0.2 % Final   Performed at [facility], [address] [place], [place] [address]   Glucose-Capillary 05/22/2019 123* 70 - 99 mg/dL Final   Comment 1 05/22/2019 Notify RN   Final   Glucose-Capillary 05/23/2019 79  70 - 99 mg/dL Final   Glucose-Capillary 05/23/2019 78  70 - 99 mg/dL Final    Assessment:  AZARYA OCONNELL is a 69 y.o. female with stage IV chronic kidney disease and a history of iron deficiency.  Diet appears fair.  She has been on oral iron for 3-4  years.  Labs on 12/19/2019 revealed a creatinine 3.84.  Ferritin was 35.7 with an iron saturation of 7% and a TIBC 321.9.  SPEP revealed an irregularity in the gamma region.  Immunofixation revealed no monoclonal protein.  Kappa free light chains were 6.68, lambda free light chains 3.40 and ratio 1.96 (0.26 - 1.65).  She was admitted to [facility] from 01/23/2018 - 01/25/2018 with a hemoglobin of 7.3 and dark stools on oral iron.  She received 2 units of PRBCs.  EGD on 05/03/20219 revealed a normal esophagus and stomach.  There were 2 small angioectasias without bleeding in the duodenal bulb and the first part of the duodenum.  A Givens capsule study was released. AVMs were felt the etiology of her iron deficiency.  Discharge hematocrit was 10.2.  Colonoscopy on 01/24/20219 revealed a suboptimal preparation.  There was diverticulosis in the sigmoid and descending colon.  Symptomatically, she denies any melena, hematochezia, hematuria or vaginal bleeding.  She has ice pica and  restless legs.  She has lost 20-25 pounds in the past year.  Exam reveals intermittent squeaks in her lung bilaterally.  Plan: 1.   Labs today:  CBC with diff, ferritin, iron studies, sed rate, B12, folate. 2.   Iron deficiency anemia  Appetite is fair.  She has been on oral iron for 3-4 years.  Ferritin was 35.7 on 12/19/2019.  Ferritin goal 100.  Discuss repleting iron stores prior to initiation of Retacrit.  Discuss IV iron (Venofer) and potential side effects.   Information provided.  Preauth Venofer. 3.   Anemia of chronic renal disease  Patient has stage IV chronic kidney disease.  Discuss use of Retacrit.   Information provided. 4.   Health maintenance  Last mammogram was in 2009.  Schedule bilateral screening mammogram.  Obtain chest CT results from recent admission in [place]. 5.   RTC in 1 week for MD assessment, review of results, and initiation of Venofer.   I discussed the assessment and treatment plan with the patient.  The patient was provided an opportunity to ask questions and all were answered.  The patient agreed with the plan and demonstrated an understanding of the instructions.  The patient was advised to call back if the symptoms worsen or if the condition fails to improve as anticipated.  I provided 25 minutes (2:52 PM - 3:17 PM) of face-to-face time during this this encounter and > 50% was spent counseling as documented under my assessment and plan. An additional 15 minutes were spent reviewing her chart (Epic and Care Everywhere) including notes, labs, and imaging studies.    Deion Swift C. Mike Gip, MD, PhD    12/31/2019, 2:52 PM  I, Heywood Footman, am acting as Education administrator for [employer]. Mike Gip, MD, PhD.  I, Croix Presley C. Mike Gip, MD, have reviewed the above documentation for accuracy and completeness, and I agree with the above.

## 2019-12-31 ENCOUNTER — Other Ambulatory Visit: Payer: Self-pay

## 2019-12-31 ENCOUNTER — Inpatient Hospital Stay: Payer: Medicare HMO | Attending: Hematology and Oncology | Admitting: Hematology and Oncology

## 2019-12-31 ENCOUNTER — Inpatient Hospital Stay: Payer: Medicare HMO

## 2019-12-31 ENCOUNTER — Encounter: Payer: Self-pay | Admitting: Hematology and Oncology

## 2019-12-31 VITALS — BP 110/54 | HR 64 | Temp 97.2°F | Resp 18 | Wt 141.1 lb

## 2019-12-31 DIAGNOSIS — I10 Essential (primary) hypertension: Secondary | ICD-10-CM

## 2019-12-31 DIAGNOSIS — Z90710 Acquired absence of both cervix and uterus: Secondary | ICD-10-CM | POA: Diagnosis not present

## 2019-12-31 DIAGNOSIS — R634 Abnormal weight loss: Secondary | ICD-10-CM

## 2019-12-31 DIAGNOSIS — F17210 Nicotine dependence, cigarettes, uncomplicated: Secondary | ICD-10-CM | POA: Insufficient documentation

## 2019-12-31 DIAGNOSIS — N184 Chronic kidney disease, stage 4 (severe): Secondary | ICD-10-CM

## 2019-12-31 DIAGNOSIS — Z79899 Other long term (current) drug therapy: Secondary | ICD-10-CM | POA: Insufficient documentation

## 2019-12-31 DIAGNOSIS — Z7982 Long term (current) use of aspirin: Secondary | ICD-10-CM | POA: Insufficient documentation

## 2019-12-31 DIAGNOSIS — E119 Type 2 diabetes mellitus without complications: Secondary | ICD-10-CM | POA: Diagnosis not present

## 2019-12-31 DIAGNOSIS — D631 Anemia in chronic kidney disease: Secondary | ICD-10-CM | POA: Diagnosis not present

## 2019-12-31 DIAGNOSIS — Z0000 Encounter for general adult medical examination without abnormal findings: Secondary | ICD-10-CM | POA: Insufficient documentation

## 2019-12-31 DIAGNOSIS — J449 Chronic obstructive pulmonary disease, unspecified: Secondary | ICD-10-CM | POA: Insufficient documentation

## 2019-12-31 DIAGNOSIS — D500 Iron deficiency anemia secondary to blood loss (chronic): Secondary | ICD-10-CM

## 2019-12-31 DIAGNOSIS — D509 Iron deficiency anemia, unspecified: Secondary | ICD-10-CM | POA: Diagnosis not present

## 2019-12-31 LAB — CBC WITH DIFFERENTIAL/PLATELET
Abs Immature Granulocytes: 0 10*3/uL (ref 0.00–0.07)
Basophils Absolute: 0 10*3/uL (ref 0.0–0.1)
Basophils Relative: 1 %
Eosinophils Absolute: 0.3 10*3/uL (ref 0.0–0.5)
Eosinophils Relative: 6 %
HCT: 30.1 % — ABNORMAL LOW (ref 36.0–46.0)
Hemoglobin: 9.1 g/dL — ABNORMAL LOW (ref 12.0–15.0)
Immature Granulocytes: 0 %
Lymphocytes Relative: 27 %
Lymphs Abs: 1.3 10*3/uL (ref 0.7–4.0)
MCH: 26 pg (ref 26.0–34.0)
MCHC: 30.2 g/dL (ref 30.0–36.0)
MCV: 86 fL (ref 80.0–100.0)
Monocytes Absolute: 0.4 10*3/uL (ref 0.1–1.0)
Monocytes Relative: 9 %
Neutro Abs: 2.8 10*3/uL (ref 1.7–7.7)
Neutrophils Relative %: 57 %
Platelets: 288 10*3/uL (ref 150–400)
RBC: 3.5 MIL/uL — ABNORMAL LOW (ref 3.87–5.11)
RDW: 14.9 % (ref 11.5–15.5)
WBC: 4.8 10*3/uL (ref 4.0–10.5)
nRBC: 0 % (ref 0.0–0.2)

## 2019-12-31 LAB — FERRITIN: Ferritin: 20 ng/mL (ref 11–307)

## 2019-12-31 LAB — FOLATE: Folate: 8.9 ng/mL (ref >5.9)

## 2019-12-31 NOTE — Patient Instructions (Signed)
Iron Sucrose injection What is this medicine? IRON SUCROSE (AHY ern SOO krohs) is an iron complex. Iron is used to make healthy red blood cells, which carry oxygen and nutrients throughout the body. This medicine is used to treat iron deficiency anemia in people with chronic kidney disease. This medicine may be used for other purposes; ask your health care provider or pharmacist if you have questions. COMMON BRAND NAME(S): Venofer What should I tell my health care provider before I take this medicine? They need to know if you have any of these conditions:  anemia not caused by low iron levels  heart disease  high levels of iron in the blood  kidney disease  liver disease  an unusual or allergic reaction to iron, other medicines, foods, dyes, or preservatives  pregnant or trying to get pregnant  breast-feeding How should I use this medicine? This medicine is for infusion into a vein. It is given by a health care professional in a hospital or clinic setting. Talk to your pediatrician regarding the use of this medicine in children. While this drug may be prescribed for children as young as 2 years for selected conditions, precautions do apply. Overdosage: If you think you have taken too much of this medicine contact a poison control center or emergency room at once. NOTE: This medicine is only for you. Do not share this medicine with others. What if I miss a dose? It is important not to miss your dose. Call your doctor or health care professional if you are unable to keep an appointment. What may interact with this medicine? Do not take this medicine with any of the following medications:  deferoxamine  dimercaprol  other iron products This medicine may also interact with the following medications:  chloramphenicol  deferasirox This list may not describe all possible interactions. Give your health care provider a list of all the medicines, herbs, non-prescription drugs, or  dietary supplements you use. Also tell them if you smoke, drink alcohol, or use illegal drugs. Some items may interact with your medicine. What should I watch for while using this medicine? Visit your doctor or healthcare professional regularly. Tell your doctor or healthcare professional if your symptoms do not start to get better or if they get worse. You may need blood work done while you are taking this medicine. You may need to follow a special diet. Talk to your doctor. Foods that contain iron include: whole grains/cereals, dried fruits, beans, or peas, leafy green vegetables, and organ meats (liver, kidney). What side effects may I notice from receiving this medicine? Side effects that you should report to your doctor or health care professional as soon as possible:  allergic reactions like skin rash, itching or hives, swelling of the face, lips, or tongue  breathing problems  changes in blood pressure  cough  fast, irregular heartbeat  feeling faint or lightheaded, falls  fever or chills  flushing, sweating, or hot feelings  joint or muscle aches/pains  seizures  swelling of the ankles or feet  unusually weak or tired Side effects that usually do not require medical attention (report to your doctor or health care professional if they continue or are bothersome):  diarrhea  feeling achy  headache  irritation at site where injected  nausea, vomiting  stomach upset  tiredness This list may not describe all possible side effects. Call your doctor for medical advice about side effects. You may report side effects to FDA at 1-800-FDA-1088. Where should I keep   my medicine? This drug is given in a hospital or clinic and will not be stored at home. NOTE: This sheet is a summary. It may not cover all possible information. If you have questions about this medicine, talk to your doctor, pharmacist, or health care provider.  2020 Elsevier/Gold Standard (2011-06-22  17:14:35)   Epoetin Alfa injection What is this medicine? EPOETIN ALFA (e POE e tin AL fa) helps your body make more red blood cells. This medicine is used to treat anemia caused by chronic kidney disease, cancer chemotherapy, or HIV-therapy. It may also be used before surgery if you have anemia. This medicine may be used for other purposes; ask your health care provider or pharmacist if you have questions. COMMON BRAND NAME(S): Epogen, Procrit, Retacrit What should I tell my health care provider before I take this medicine? They need to know if you have any of these conditions:  cancer  heart disease  high blood pressure  history of blood clots  history of stroke  low levels of folate, iron, or vitamin B12 in the blood  seizures  an unusual or allergic reaction to erythropoietin, albumin, benzyl alcohol, hamster proteins, other medicines, foods, dyes, or preservatives  pregnant or trying to get pregnant  breast-feeding How should I use this medicine? This medicine is for injection into a vein or under the skin. It is usually given by a health care professional in a hospital or clinic setting. If you get this medicine at home, you will be taught how to prepare and give this medicine. Use exactly as directed. Take your medicine at regular intervals. Do not take your medicine more often than directed. It is important that you put your used needles and syringes in a special sharps container. Do not put them in a trash can. If you do not have a sharps container, call your pharmacist or healthcare provider to get one. A special MedGuide will be given to you by the pharmacist with each prescription and refill. Be sure to read this information carefully each time. Talk to your pediatrician regarding the use of this medicine in children. While this drug may be prescribed for selected conditions, precautions do apply. Overdosage: If you think you have taken too much of this medicine  contact a poison control center or emergency room at once. NOTE: This medicine is only for you. Do not share this medicine with others. What if I miss a dose? If you miss a dose, take it as soon as you can. If it is almost time for your next dose, take only that dose. Do not take double or extra doses. What may interact with this medicine? Interactions have not been studied. This list may not describe all possible interactions. Give your health care provider a list of all the medicines, herbs, non-prescription drugs, or dietary supplements you use. Also tell them if you smoke, drink alcohol, or use illegal drugs. Some items may interact with your medicine. What should I watch for while using this medicine? Your condition will be monitored carefully while you are receiving this medicine. You may need blood work done while you are taking this medicine. This medicine may cause a decrease in vitamin B6. You should make sure that you get enough vitamin B6 while you are taking this medicine. Discuss the foods you eat and the vitamins you take with your health care professional. What side effects may I notice from receiving this medicine? Side effects that you should report to your doctor or  health care professional as soon as possible:  allergic reactions like skin rash, itching or hives, swelling of the face, lips, or tongue  seizures  signs and symptoms of a blood clot such as breathing problems; changes in vision; chest pain; severe, sudden headache; pain, swelling, warmth in the leg; trouble speaking; sudden numbness or weakness of the face, arm or leg  signs and symptoms of a stroke like changes in vision; confusion; trouble speaking or understanding; severe headaches; sudden numbness or weakness of the face, arm or leg; trouble walking; dizziness; loss of balance or coordination Side effects that usually do not require medical attention (report to your doctor or health care professional if they  continue or are bothersome):  chills  cough  dizziness  fever  headaches  joint pain  muscle cramps  muscle pain  nausea, vomiting  pain, redness, or irritation at site where injected This list may not describe all possible side effects. Call your doctor for medical advice about side effects. You may report side effects to FDA at 1-800-FDA-1088. Where should I keep my medicine? Keep out of the reach of children. Store in a refrigerator between 2 and 8 degrees C (36 and 46 degrees F). Do not freeze or shake. Throw away any unused portion if using a single-dose vial. Multi-dose vials can be kept in the refrigerator for up to 21 days after the initial dose. Throw away unused medicine. NOTE: This sheet is a summary. It may not cover all possible information. If you have questions about this medicine, talk to your doctor, pharmacist, or health care provider.  2020 Elsevier/Gold Standard (2017-04-20 08:35:19)

## 2020-01-01 LAB — VITAMIN B12: Vitamin B-12: 411 pg/mL (ref 180–914)

## 2020-01-05 ENCOUNTER — Ambulatory Visit
Admission: RE | Admit: 2020-01-05 | Discharge: 2020-01-05 | Disposition: A | Discharge status: Home / Self Care | Payer: Medicare HMO | Source: Ambulatory Visit | Attending: Hematology and Oncology | Admitting: Hematology and Oncology

## 2020-01-05 DIAGNOSIS — R928 Other abnormal and inconclusive findings on diagnostic imaging of breast: Secondary | ICD-10-CM | POA: Diagnosis not present

## 2020-01-05 DIAGNOSIS — Z0000 Encounter for general adult medical examination without abnormal findings: Secondary | ICD-10-CM

## 2020-01-05 DIAGNOSIS — Z1231 Encounter for screening mammogram for malignant neoplasm of breast: Secondary | ICD-10-CM | POA: Diagnosis not present

## 2020-01-05 DIAGNOSIS — R634 Abnormal weight loss: Secondary | ICD-10-CM | POA: Diagnosis not present

## 2020-01-06 ENCOUNTER — Other Ambulatory Visit: Payer: Self-pay | Admitting: Hematology and Oncology

## 2020-01-06 DIAGNOSIS — R928 Other abnormal and inconclusive findings on diagnostic imaging of breast: Secondary | ICD-10-CM

## 2020-01-06 DIAGNOSIS — N6310 Unspecified lump in the right breast, unspecified quadrant: Secondary | ICD-10-CM

## 2020-01-07 ENCOUNTER — Inpatient Hospital Stay: Payer: Medicare HMO | Admitting: Hematology and Oncology

## 2020-01-07 ENCOUNTER — Other Ambulatory Visit: Payer: Self-pay

## 2020-01-07 ENCOUNTER — Ambulatory Visit: Payer: Medicare HMO

## 2020-01-07 DIAGNOSIS — D631 Anemia in chronic kidney disease: Secondary | ICD-10-CM | POA: Insufficient documentation

## 2020-01-07 DIAGNOSIS — R928 Other abnormal and inconclusive findings on diagnostic imaging of breast: Secondary | ICD-10-CM | POA: Insufficient documentation

## 2020-01-07 NOTE — Progress Notes (Deleted)
[facility]  [address], [address] [place], [place] [address] Phone: [phone]  Fax: [phone]   Clinic Day:  01/07/2020  Referring physician: Iona Beard, MD  Chief Complaint: Erica [name] is a 69 y.o. female with stage IV chronic kidney disease and iron deficiency who is seen for review of work-up and initiation of IV iron.   HPI: The patient was last seen in the hematology clinic on 12/31/2019 for initial consultation. She noted kidney problems for 2-3 years.  She had a history of GI bleeding secondary to AVMs.  She had been on oral iron for 3-4 years.  She was on 3 liters/min of oxygen via [place] for COPD.   She had lost 20-25 pounds unintentionally in the past year.  She had recently been admitted to [facility] for a COPD excerbation.  Exam reveals intermittent squeaks in her lung bilaterally.  Work-up included a hematocrit was 30.1, hemoglobin 9.1, MCV 86.0, platelets 288,000, WBC 4800 with an ANC 2800.  Ferritin was 20.  B12 was 411 and folate 8.9.  Bilateral mammogram on 01/05/2020 revealed a possible mass in the right breast. There were no suspicious findings for malignancy in the left breast. Diagnostic mammogram and ultrasound were ordered.  Last weight on 12/31/2019 141 lb 1.5 oz  During the interim, ***   Past Medical History:  Diagnosis Date  . Arthritis   . CHF (congestive heart failure) ([place])   . Chronic back pain   . COPD (chronic obstructive pulmonary disease) ([facility])    2L home O2  . Diabetes mellitus without complication ([place])   . Hyperlipidemia   . Hypertension   . Stroke (cerebrum) [facility])     Past Surgical History:  Procedure Laterality Date  . ABDOMINAL HYSTERECTOMY    . colonoscopy  2009   Dr. Gala Romney: cluster of diminutive rectal polyps and 2 diminutive polyps in rectosigmoid junction, hyperplastic  . COLONOSCOPY WITH PROPOFOL N/A 10/18/2017   Procedure: COLONOSCOPY WITH PROPOFOL;  Surgeon: Daneil Dolin, MD;  Location: [facility]  ENDO SUITE;  Service: Endoscopy;  Laterality: N/A;  9:30am  . ESOPHAGOGASTRODUODENOSCOPY N/A 01/25/2018   Procedure: ESOPHAGOGASTRODUODENOSCOPY (EGD);  Surgeon: Danie Binder, MD;  Location: [facility] ENDO SUITE;  Service: Endoscopy;  Laterality: N/A;  . ESOPHAGOGASTRODUODENOSCOPY (EGD) WITH PROPOFOL N/A 08/29/2017   Dr. Gala Romney with propofol: small hiatal hernia. 4cm fundal diverticulum.   Marland Kitchen GIVENS CAPSULE STUDY N/A 01/25/2018   Procedure: GIVENS CAPSULE STUDY;  Surgeon: Danie Binder, MD;  Location: [facility] ENDO SUITE;  Service: Endoscopy;  Laterality: N/A;  . left hand middle finger     in an accident at work, amputated    Family History  Problem Relation Age of Onset  . CVA Mother 63  . Cancer Neg Hx   . Colon cancer Neg Hx   . Breast cancer Neg Hx     Social History:  reports that she has been smoking cigarettes. She has a 13.00 pack-year smoking history. She has never used smokeless tobacco. She reports that she does not drink alcohol or use drugs. She used to smoke tobacco for 20 years. She stopped smoking at the beginning of 2021. Her sister's name is Erica [name]. The patient is {Blank single:19197::"alone","accompanied by"} *** today.  Allergies: No Known Allergies  Current Medications: Current Outpatient Medications  Medication Sig Dispense Refill  . amLODipine (NORVASC) 10 MG tablet     . aspirin EC 81 MG tablet Take 162 mg by mouth daily.    Marland Kitchen atorvastatin (  LIPITOR) 80 MG tablet Take 80 mg by mouth daily.     . budesonide-formoterol (SYMBICORT) 160-4.5 MCG/ACT inhaler Inhale 2 puffs into the lungs 2 (two) times daily. 1 Inhaler 3  . buPROPion (WELLBUTRIN SR) 150 MG 12 hr tablet Take 150 mg by mouth 2 (two) times daily.    . carvedilol (COREG) 12.5 MG tablet     . clopidogrel (PLAVIX) 75 MG tablet Take 75 mg by mouth daily.   5  . famotidine (PEPCID) 20 MG tablet Take 20 mg by mouth daily.   10  . ferrous sulfate 325 (65 FE) MG EC tablet Take 1 tablet (325 mg total) by mouth 2 (two) times  daily. 60 tablet 2  . ipratropium-albuterol (DUONEB) 0.5-2.5 (3) MG/3ML SOLN Take 3 mLs by nebulization every 6 (six) hours as needed (for shortness of breath).    . Linaclotide (LINZESS) 145 MCG CAPS capsule Take 145 mcg by mouth daily as needed (for constipation).     . nicotine (NICODERM CQ - DOSED IN MG/24 HOURS) 14 mg/24hr patch     . omega-3 acid Erica esters (LOVAZA) 1 g capsule Take 1 g by mouth daily.     . pantoprazole (PROTONIX) 40 MG tablet Take 1 tablet (40 mg total) by mouth daily. 30 tablet 2  . saxagliptin HCl (ONGLYZA) 5 MG TABS tablet Take 5 mg by mouth daily. Reported on 09/22/2015    . SPIRIVA HANDIHALER 18 MCG inhalation capsule Take 1 puff by mouth daily.    Marland Kitchen torsemide (DEMADEX) 20 MG tablet Take 1 tablet (20 mg total) by mouth daily. Take 1 tablet on Monday, Wednesday, and Friday only. 90 tablet 3  . valsartan-hydrochlorothiazide (DIOVAN-HCT) 160-25 MG tablet      No current facility-administered medications for this visit.    Review of Systems  Constitutional: Negative for chills, diaphoresis, fever, malaise/fatigue and weight loss.  HENT: Negative for congestion, hearing loss, sinus pain, sore throat and tinnitus.   Eyes: Negative for blurred vision and double vision.  Respiratory: Positive for shortness of breath (upon exertion). Negative for cough and stridor.   Cardiovascular: Negative for chest pain, palpitations and leg swelling.  Gastrointestinal: Positive for nausea. Negative for constipation, diarrhea and vomiting.  Genitourinary: Negative for frequency and urgency.  Musculoskeletal: Positive for joint pain (hands sometimes cramp). Negative for back pain, falls and myalgias.  Skin: Negative for itching and rash.  Neurological: Positive for headaches (intermittent). Negative for dizziness and weakness.  Psychiatric/Behavioral: Negative for depression. The patient is not nervous/anxious.   All other systems reviewed and are negative.   Performance status  (ECOG): 0 - Asymptomatic  Vitals There were no vitals taken for this visit.   Physical Exam  Constitutional: She is oriented to person, place, and time. She appears well-developed and well-nourished. No distress. Face mask in place.  HENT:  Head: Normocephalic and atraumatic.  Right Ear: Hearing normal.  Left Ear: Hearing normal.  Mouth/Throat: Oropharynx is clear and moist and mucous membranes are normal. No oral lesions.  Short brown hair. Mask  Eyes: Pupils are equal, round, and reactive to light. Conjunctivae and EOM are normal. Right eye exhibits no discharge. Left eye exhibits no discharge. No scleral icterus.  Neck: No JVD present.  Cardiovascular: Normal rate, regular rhythm and normal heart sounds. Exam reveals no gallop and no friction rub.  No murmur heard. Pulmonary/Chest: Effort normal and breath sounds normal. She has no wheezes. She has no rhonchi. She has no rales.  Abdominal: Soft. Normal  appearance and bowel sounds are normal. She exhibits no mass. There is no hepatosplenomegaly. There is no abdominal tenderness. There is no CVA tenderness.  Musculoskeletal:        General: Tenderness (abdominal) present. No edema. Normal range of motion.     Cervical back: Normal range of motion and neck supple.  Lymphadenopathy:    She has no cervical adenopathy.       Right cervical: No superficial cervical adenopathy present.   She has no axillary adenopathy.  Neurological: She is alert and oriented to person, place, and time.  Skin: Skin is warm, dry and intact. No bruising, no lesion and no rash noted. No erythema. No pallor.  Psychiatric: She has a normal mood and affect. Her behavior is normal. Judgment and thought content normal.     No visits with results within 3 Day(s) from this visit.  Latest known visit with results is:  Appointment on 12/31/2019  Component Date Value Ref Range Status  . Folate 12/31/2019 8.9  >5.9 ng/mL Final   Performed at [facility],  [address] [place], [place] [address]  . Vitamin B-12 12/31/2019 411  180 - 914 pg/mL Final   Comment: (NOTE) This assay is not validated for testing neonatal or myeloproliferative syndrome specimens for Vitamin B12 levels. Performed at [facility], [place] [address] [place], [place] [address]   . Ferritin 12/31/2019 20  11 - 307 ng/mL Final   Performed at [facility], [address] [place], [place] [address]  . WBC 12/31/2019 4.8  4.0 - 10.5 K/uL Final  . RBC 12/31/2019 3.50* 3.87 - 5.11 MIL/uL Final  . Hemoglobin 12/31/2019 9.1* 12.0 - 15.0 g/dL Final  . HCT 12/31/2019 30.1* 36.0 - 46.0 % Final  . MCV 12/31/2019 86.0  80.0 - 100.0 fL Final  . MCH 12/31/2019 26.0  26.0 - 34.0 pg Final  . MCHC 12/31/2019 30.2  30.0 - 36.0 g/dL Final  . RDW 12/31/2019 14.9  11.5 - 15.5 % Final  . Platelets 12/31/2019 288  150 - 400 K/uL Final  . nRBC 12/31/2019 0.0  0.0 - 0.2 % Final  . Neutrophils Relative % 12/31/2019 57  % Final  . Neutro Abs 12/31/2019 2.8  1.7 - 7.7 K/uL Final  . Lymphocytes Relative 12/31/2019 27  % Final  . Lymphs Abs 12/31/2019 1.3  0.7 - 4.0 K/uL Final  . Monocytes Relative 12/31/2019 9  % Final  . Monocytes Absolute 12/31/2019 0.4  0.1 - 1.0 K/uL Final  . Eosinophils Relative 12/31/2019 6  % Final  . Eosinophils Absolute 12/31/2019 0.3  0.0 - 0.5 K/uL Final  . Basophils Relative 12/31/2019 1  % Final  . Basophils Absolute 12/31/2019 0.0  0.0 - 0.1 K/uL Final  . Immature Granulocytes 12/31/2019 0  % Final  . Abs Immature Granulocytes 12/31/2019 0.00  0.00 - 0.07 K/uL Final   Performed at [facility], [address], [place], [place] [address]    Assessment:  Erica [name] is a 69 y.o. female  with stage IV chronic kidney disease and a history of iron deficiency. Diet appears fair.  She has been on oral iron for 3-4 years.  She has ice pica and restless legs.  Labs on 12/19/2019 revealed a creatinine 3.84.  Ferritin was 35.7 with  an iron saturation of 7% and a TIBC 321.9.  SPEP revealed an irregularity in the gamma region.  Immunofixation revealed no monoclonal protein.  Kappa  free light chains were 6.68, lambda free light chains 3.40 and ratio 1.96 (0.26 - 1.65).  Work-up on 12/31/2019 revealed a hematocrit was 30.1, hemoglobin 9.1, MCV 86.0, platelets 288,000, WBC 4800 with an ANC 2800.  Ferritin was 20.  B12 was 411 and folate 8.9.  Ferritin has been followed:  35.7 on 12/19/2019 and 20 on 12/31/2019.  She was admitted to [facility] from 01/23/2018 - 01/25/2018 with a hemoglobin of 7.3 and dark stools on oral iron.  She received 2 units of PRBCs.  EGD on 05/03/20219 revealed a normal esophagus and stomach.  There were 2 small angioectasias without bleeding in the duodenal bulb and the first part of the duodenum.  A Givens capsule study was released. AVMs were felt the etiology of her iron deficiency.  Discharge hematocrit was 10.2.  Colonoscopy on 01/24/20219 revealed a suboptimal preparation.  There was diverticulosis in the sigmoid and descending colon.  She has had a 20-25 pound weight loss in the past year.  She was recently admitted to [facility] with a COPD exacerbation.    Bilateral screening mammogram on 01/05/2020 revealed a possible mass in the right breast. No suspicious findings for malignancy in the left breast.   Symptomatically, ***  Plan: 1.   Iron deficiency anemia             Appetite is fair.  She has been on oral iron for 3-4 years.             Ferritin was 35.7 on 12/19/2019.             Ferritin goal 100.             Discuss repleting iron stores prior to initiation of Retacrit.             Discuss IV iron (Venofer) and potential side effects.                         Information provided.             Preauth Venofer.  3.   Anemia of chronic renal disease             Patient has stage IV chronic kidney disease.             Discuss use of Retacrit.                          Information provided.  4.   Abnormal mammogram             Review mammogram from 01/14/2020.   There is a possible mass in the right breast.  Discuss plan for diagnostic mammogram and ultrasound.              Obtain chest CT results from recent admission in [place]. 5.   RTC in 1 week for MD assessment, review of results, and initiation of Venofer   I discussed the assessment and treatment plan with the patient.  The patient was provided an opportunity to ask questions and all were answered.  The patient agreed with the plan and demonstrated an understanding of the instructions.  The patient was advised to call back if the symptoms worsen or if the condition fails to improve as anticipated.  I provided *** minutes (2:34 PM - 2:34 PM) of face-to-face time during this this encounter and > 50% was spent counseling as documented under my assessment and  plan.    Lequita Asal, MD, PhD    01/07/2020, 2:34 PM  I, Heywood Footman, am acting as Education administrator for [employer]. Mike Gip, MD, PhD.  {Add scribe attestation statement}

## 2020-01-14 ENCOUNTER — Inpatient Hospital Stay: Payer: Medicare HMO

## 2020-01-14 ENCOUNTER — Inpatient Hospital Stay: Payer: Medicare HMO | Admitting: Hematology and Oncology

## 2020-01-20 ENCOUNTER — Ambulatory Visit
Admission: RE | Admit: 2020-01-20 | Discharge: 2020-01-20 | Disposition: A | Discharge status: Home / Self Care | Payer: Medicare HMO | Source: Ambulatory Visit | Attending: Hematology and Oncology | Admitting: Hematology and Oncology

## 2020-01-20 DIAGNOSIS — N6310 Unspecified lump in the right breast, unspecified quadrant: Secondary | ICD-10-CM

## 2020-01-20 DIAGNOSIS — N6001 Solitary cyst of right breast: Secondary | ICD-10-CM | POA: Diagnosis not present

## 2020-01-20 DIAGNOSIS — R928 Other abnormal and inconclusive findings on diagnostic imaging of breast: Secondary | ICD-10-CM | POA: Diagnosis not present

## 2020-01-23 DIAGNOSIS — E1122 Type 2 diabetes mellitus with diabetic chronic kidney disease: Secondary | ICD-10-CM | POA: Diagnosis not present

## 2020-01-23 DIAGNOSIS — N184 Chronic kidney disease, stage 4 (severe): Secondary | ICD-10-CM | POA: Diagnosis not present

## 2020-01-23 DIAGNOSIS — I5032 Chronic diastolic (congestive) heart failure: Secondary | ICD-10-CM | POA: Diagnosis not present

## 2020-01-23 DIAGNOSIS — I130 Hypertensive heart and chronic kidney disease with heart failure and stage 1 through stage 4 chronic kidney disease, or unspecified chronic kidney disease: Secondary | ICD-10-CM | POA: Diagnosis not present

## 2020-01-27 DIAGNOSIS — R0602 Shortness of breath: Secondary | ICD-10-CM | POA: Diagnosis not present

## 2020-01-27 DIAGNOSIS — R0902 Hypoxemia: Secondary | ICD-10-CM | POA: Diagnosis not present

## 2020-02-04 DIAGNOSIS — I5030 Unspecified diastolic (congestive) heart failure: Secondary | ICD-10-CM | POA: Diagnosis not present

## 2020-02-04 DIAGNOSIS — F17200 Nicotine dependence, unspecified, uncomplicated: Secondary | ICD-10-CM | POA: Diagnosis not present

## 2020-02-04 DIAGNOSIS — N184 Chronic kidney disease, stage 4 (severe): Secondary | ICD-10-CM | POA: Diagnosis not present

## 2020-02-04 DIAGNOSIS — E1140 Type 2 diabetes mellitus with diabetic neuropathy, unspecified: Secondary | ICD-10-CM | POA: Diagnosis not present

## 2020-02-04 DIAGNOSIS — E1122 Type 2 diabetes mellitus with diabetic chronic kidney disease: Secondary | ICD-10-CM | POA: Diagnosis not present

## 2020-02-04 DIAGNOSIS — D631 Anemia in chronic kidney disease: Secondary | ICD-10-CM | POA: Diagnosis not present

## 2020-02-04 DIAGNOSIS — D509 Iron deficiency anemia, unspecified: Secondary | ICD-10-CM | POA: Diagnosis not present

## 2020-02-04 DIAGNOSIS — M549 Dorsalgia, unspecified: Secondary | ICD-10-CM | POA: Diagnosis not present

## 2020-02-04 DIAGNOSIS — E875 Hyperkalemia: Secondary | ICD-10-CM | POA: Diagnosis not present

## 2020-02-04 DIAGNOSIS — I130 Hypertensive heart and chronic kidney disease with heart failure and stage 1 through stage 4 chronic kidney disease, or unspecified chronic kidney disease: Secondary | ICD-10-CM | POA: Diagnosis not present

## 2020-02-05 DIAGNOSIS — R0602 Shortness of breath: Secondary | ICD-10-CM | POA: Diagnosis not present

## 2020-02-05 DIAGNOSIS — J449 Chronic obstructive pulmonary disease, unspecified: Secondary | ICD-10-CM | POA: Diagnosis not present

## 2020-02-05 DIAGNOSIS — I509 Heart failure, unspecified: Secondary | ICD-10-CM | POA: Diagnosis not present

## 2020-02-05 DIAGNOSIS — E139 Other specified diabetes mellitus without complications: Secondary | ICD-10-CM | POA: Diagnosis not present

## 2020-02-05 DIAGNOSIS — Z7689 Persons encountering health services in other specified circumstances: Secondary | ICD-10-CM | POA: Diagnosis not present

## 2020-02-07 ENCOUNTER — Encounter (HOSPITAL_COMMUNITY): Payer: Self-pay | Admitting: *Deleted

## 2020-02-07 ENCOUNTER — Other Ambulatory Visit: Payer: Self-pay

## 2020-02-07 ENCOUNTER — Emergency Department (HOSPITAL_COMMUNITY): Payer: Medicare HMO

## 2020-02-07 ENCOUNTER — Emergency Department (HOSPITAL_COMMUNITY)
Admission: EM | Admit: 2020-02-07 | Discharge: 2020-02-07 | Disposition: A | Discharge status: Home / Self Care | Payer: Medicare HMO | Attending: Emergency Medicine | Admitting: Emergency Medicine

## 2020-02-07 DIAGNOSIS — I509 Heart failure, unspecified: Secondary | ICD-10-CM | POA: Insufficient documentation

## 2020-02-07 DIAGNOSIS — E1122 Type 2 diabetes mellitus with diabetic chronic kidney disease: Secondary | ICD-10-CM | POA: Diagnosis not present

## 2020-02-07 DIAGNOSIS — I110 Hypertensive heart disease with heart failure: Secondary | ICD-10-CM | POA: Diagnosis not present

## 2020-02-07 DIAGNOSIS — Z7984 Long term (current) use of oral hypoglycemic drugs: Secondary | ICD-10-CM | POA: Diagnosis not present

## 2020-02-07 DIAGNOSIS — F17210 Nicotine dependence, cigarettes, uncomplicated: Secondary | ICD-10-CM | POA: Insufficient documentation

## 2020-02-07 DIAGNOSIS — I959 Hypotension, unspecified: Secondary | ICD-10-CM | POA: Diagnosis not present

## 2020-02-07 DIAGNOSIS — J449 Chronic obstructive pulmonary disease, unspecified: Secondary | ICD-10-CM | POA: Diagnosis not present

## 2020-02-07 DIAGNOSIS — Z79899 Other long term (current) drug therapy: Secondary | ICD-10-CM | POA: Diagnosis not present

## 2020-02-07 DIAGNOSIS — I130 Hypertensive heart and chronic kidney disease with heart failure and stage 1 through stage 4 chronic kidney disease, or unspecified chronic kidney disease: Secondary | ICD-10-CM | POA: Insufficient documentation

## 2020-02-07 DIAGNOSIS — N184 Chronic kidney disease, stage 4 (severe): Secondary | ICD-10-CM | POA: Diagnosis not present

## 2020-02-07 DIAGNOSIS — R0602 Shortness of breath: Secondary | ICD-10-CM | POA: Diagnosis not present

## 2020-02-07 LAB — CBC WITH DIFFERENTIAL/PLATELET
Abs Immature Granulocytes: 0.01 10*3/uL (ref 0.00–0.07)
Basophils Absolute: 0 10*3/uL (ref 0.0–0.1)
Basophils Relative: 1 %
Eosinophils Absolute: 0.2 10*3/uL (ref 0.0–0.5)
Eosinophils Relative: 4 %
HCT: 33.3 % — ABNORMAL LOW (ref 36.0–46.0)
Hemoglobin: 9.8 g/dL — ABNORMAL LOW (ref 12.0–15.0)
Immature Granulocytes: 0 %
Lymphocytes Relative: 21 %
Lymphs Abs: 0.9 10*3/uL (ref 0.7–4.0)
MCH: 26.4 pg (ref 26.0–34.0)
MCHC: 29.4 g/dL — ABNORMAL LOW (ref 30.0–36.0)
MCV: 89.8 fL (ref 80.0–100.0)
Monocytes Absolute: 0.5 10*3/uL (ref 0.1–1.0)
Monocytes Relative: 11 %
Neutro Abs: 2.8 10*3/uL (ref 1.7–7.7)
Neutrophils Relative %: 63 %
Platelets: 279 10*3/uL (ref 150–400)
RBC: 3.71 MIL/uL — ABNORMAL LOW (ref 3.87–5.11)
RDW: 14.9 % (ref 11.5–15.5)
WBC: 4.4 10*3/uL (ref 4.0–10.5)
nRBC: 0 % (ref 0.0–0.2)

## 2020-02-07 LAB — COMPREHENSIVE METABOLIC PANEL
ALT: 6 U/L (ref 0–44)
AST: 8 U/L — ABNORMAL LOW (ref 15–41)
Albumin: 3.2 g/dL — ABNORMAL LOW (ref 3.5–5.0)
Alkaline Phosphatase: 53 U/L (ref 38–126)
Anion gap: 8 (ref 5–15)
BUN: 44 mg/dL — ABNORMAL HIGH (ref 8–23)
CO2: 29 mmol/L (ref 22–32)
Calcium: 8.5 mg/dL — ABNORMAL LOW (ref 8.9–10.3)
Chloride: 102 mmol/L (ref 98–111)
Creatinine, Ser: 2.23 mg/dL — ABNORMAL HIGH (ref 0.44–1.00)
GFR calc Af Amer: 25 mL/min — ABNORMAL LOW (ref >60)
GFR calc non Af Amer: 22 mL/min — ABNORMAL LOW (ref >60)
Glucose, Bld: 128 mg/dL — ABNORMAL HIGH (ref 70–99)
Potassium: 4.4 mmol/L (ref 3.5–5.1)
Sodium: 139 mmol/L (ref 135–145)
Total Bilirubin: 0.4 mg/dL (ref 0.3–1.2)
Total Protein: 6.2 g/dL — ABNORMAL LOW (ref 6.5–8.1)

## 2020-02-07 LAB — TROPONIN I (HIGH SENSITIVITY)
Troponin I (High Sensitivity): 8 ng/L (ref <18)
Troponin I (High Sensitivity): 8 ng/L (ref <18)

## 2020-02-07 LAB — BRAIN NATRIURETIC PEPTIDE: B Natriuretic Peptide: 815 pg/mL — ABNORMAL HIGH (ref 0.0–100.0)

## 2020-02-07 MED ORDER — ALBUTEROL SULFATE HFA 108 (90 BASE) MCG/ACT IN AERS
2.0000 | INHALATION_SPRAY | Freq: Once | RESPIRATORY_TRACT | Status: AC
  Administered 2020-02-07: 2 via RESPIRATORY_TRACT
  Filled 2020-02-07: qty 6.7

## 2020-02-07 MED ORDER — FUROSEMIDE 10 MG/ML IJ SOLN
40.0000 mg | Freq: Once | INTRAMUSCULAR | Status: AC
  Administered 2020-02-07: 40 mg via INTRAVENOUS
  Filled 2020-02-07: qty 4

## 2020-02-07 NOTE — ED Triage Notes (Signed)
Pt brought in by [facility] for c/o sob for the last couple of days; pt states she has not had any appetite; pt was noted to have hypotension en route with ems; pt had a duoneb en route; pt wears 4L O2 at home all the time; pt just states "I feel bad all over"

## 2020-02-07 NOTE — ED Provider Notes (Signed)
[facility] Provider Note   CSN: [id number] Arrival date & time: 02/07/20  1622     History Chief Complaint  Patient presents with  . Shortness of Breath    Erica [name] is a 69 y.o. female.  HPI Patient denies shortness of breath.  Has had for last couple days.  Decreased appetite.  Mild hypotension initially.  Feels bad all over.  States she feels that this could be CHF and COPD.  History of both and is on chronic oxygen.  No chest pain.  No fevers.    Past Medical History:  Diagnosis Date  . Arthritis   . CHF (congestive heart failure) ([place])   . Chronic back pain   . COPD (chronic obstructive pulmonary disease) (H[facility])    2L home O2  . Diabetes mellitus without complication ([place])   . Hyperlipidemia   . Hypertension   . Stroke (cerebrum) [facility])     Patient Active Problem List   Diagnosis Date Noted  . Anemia in stage 4 chronic kidney disease ([place]) 01/07/2020  . Abnormal mammogram 01/07/2020  . Anemia due to stage 4 chronic kidney disease ([place]) 12/31/2019  . Health care maintenance 12/31/2019  . Weight loss 12/31/2019  . Acute hyperkalemia 05/21/2019  . Acute hypoxemic respiratory failure ([place]) 09/03/2018  . Acute diastolic (congestive) heart failure ([place]) 03/09/2018  . AVM (arteriovenous malformation) of duodenum, acquired   . GIB (gastrointestinal bleeding) 01/23/2018  . CKD (chronic kidney disease) 01/23/2018  . Hyperkalemia 12/21/2017  . Hyperlipidemia 12/18/2017  . Hypomagnesemia 12/17/2017  . Acute renal failure superimposed on stage 3 chronic kidney disease ([place]) 12/07/2017  . Acute on chronic respiratory failure with hypoxia ([place]) 12/07/2017  . COPD with acute exacerbation ([place]) 12/07/2017  . Acute diastolic CHF (congestive heart failure) ([place]) 09/19/2017  . Iron deficiency anemia due to chronic blood loss   . Acute diverticulitis 08/29/2017  . Heme positive stool   . Symptomatic anemia   . GI bleed 08/27/2017  . Elevated  troponin 08/27/2017  . (HFpEF) heart failure with preserved ejection fraction ([place]) 08/27/2017  . Acute dyspnea   . Acute on chronic respiratory failure ([place]) 02/11/2017  . Acute on chronic diastolic CHF (congestive heart failure) ([place]) 02/11/2017  . Benign essential HTN 11/20/2015  . GERD (gastroesophageal reflux disease) 11/20/2015  . Tobacco abuse 11/20/2015  . Type 2 diabetes mellitus with hyperglycemia ([place]) 11/20/2015  . COPD exacerbation ([place]) 09/05/2015  . Chronic pain syndrome 09/05/2015  . Hypertension 09/05/2015  . Diabetes mellitus type 2, controlled ([place]) 09/05/2015  . Chronic respiratory failure ([place]) 09/05/2015  . OLECRANON BURSITIS, LEFT 08/04/2010    Past Surgical History:  Procedure Laterality Date  . ABDOMINAL HYSTERECTOMY    . colonoscopy  2009   Dr. Gala Romney: cluster of diminutive rectal polyps and 2 diminutive polyps in rectosigmoid junction, hyperplastic  . COLONOSCOPY WITH PROPOFOL N/A 10/18/2017   Procedure: COLONOSCOPY WITH PROPOFOL;  Surgeon: Daneil Dolin, MD;  [facility];  Service: Endoscopy;  Laterality: N/A;  9:30am  . ESOPHAGOGASTRODUODENOSCOPY N/A 01/25/2018   Procedure: ESOPHAGOGASTRODUODENOSCOPY (EGD);  Surgeon: Danie Binder, MD;  Location: [facility];  Service: Endoscopy;  Laterality: N/A;  . ESOPHAGOGASTRODUODENOSCOPY (EGD) WITH PROPOFOL N/A 08/29/2017   Dr. Gala Romney with propofol: small hiatal hernia. 4cm fundal diverticulum.   Marland Kitchen GIVENS CAPSULE STUDY N/A 01/25/2018   Procedure: GIVENS CAPSULE STUDY;  Surgeon: Danie Binder, MD;  Location: [facility];  Service: Endoscopy;  Laterality: N/A;  .  left hand middle finger     in an accident at work, amputated     OB History    Gravida  0   Para  0   Term  0   Preterm  0   AB  0   Living        SAB  0   TAB  0   Ectopic  0   Multiple      Live Births              Family History  Problem Relation Age of Onset  . CVA Mother 31  . Cancer Neg Hx   . Colon  cancer Neg Hx   . Breast cancer Neg Hx     Social History   Tobacco Use  . Smoking status: Current Some Day Smoker    Packs/day: 0.25    Years: 52.00    Pack years: 13.00    Types: Cigarettes    Last attempt to quit: 09/23/2017    Years since quitting: 2.3  . Smokeless tobacco: Never Used  Substance Use Topics  . Alcohol use: No  . Drug use: No    Home Medications Prior to Admission medications   Medication Sig Start Date End Date Taking? Authorizing Provider  amLODipine (NORVASC) 10 MG tablet  10/31/19   [provider]  aspirin EC 81 MG tablet Take 162 mg by mouth daily.    [provider]  atorvastatin (LIPITOR) 80 MG tablet Take 80 mg by mouth daily.  12/04/16   [provider]  budesonide-formoterol (SYMBICORT) 160-4.5 MCG/ACT inhaler Inhale 2 puffs into the lungs 2 (two) times daily. 11/23/15   Barton Dubois, MD  buPROPion [facility] SR) 150 MG 12 hr tablet Take 150 mg by mouth 2 (two) times daily. 12/04/16   [provider]  carvedilol (COREG) 12.5 MG tablet  10/31/19   [provider]  clopidogrel (PLAVIX) 75 MG tablet Take 75 mg by mouth daily.  03/29/18   [provider]  famotidine (PEPCID) 20 MG tablet Take 20 mg by mouth daily.  03/29/18   [provider]  ferrous sulfate 325 (65 FE) MG EC tablet Take 1 tablet (325 mg total) by mouth 2 (two) times daily. 01/25/18 12/02/19  Erline Hau, MD  ipratropium-albuterol (DUONEB) 0.5-2.5 (3) MG/3ML SOLN Take 3 mLs by nebulization every 6 (six) hours as needed (for shortness of breath).    [provider]  Linaclotide Rolan Lipa) 145 MCG CAPS capsule Take 145 mcg by mouth daily as needed (for constipation).     [provider]  nicotine (NICODERM CQ - DOSED IN MG/24 HOURS) 14 mg/24hr patch  11/03/19   [provider]  omega-3 acid ethyl esters (LOVAZA) 1 g capsule Take 1 g by mouth daily.  12/07/16   [provider]  pantoprazole  (PROTONIX) 40 MG tablet Take 1 tablet (40 mg total) by mouth daily. 01/25/18   Isaac Bliss, Rayford Halsted, MD  saxagliptin HCl (ONGLYZA) 5 MG TABS tablet Take 5 mg by mouth daily. Reported on 09/22/2015    [provider]  SPIRIVA HANDIHALER 18 MCG inhalation capsule Take 1 puff by mouth daily. 10/31/17   [provider]  torsemide (DEMADEX) 20 MG tablet Take 1 tablet (20 mg total) by mouth daily. Take 1 tablet on Monday, Wednesday, and Friday only. 02/26/19   Arnoldo Lenis, MD  valsartan-hydrochlorothiazide (DIOVAN-HCT) 160-25 MG tablet  10/31/19   [provider]  Allergies    Patient has no known allergies.  Review of Systems   Review of Systems  Constitutional: Positive for fatigue. Negative for appetite change and fever.  HENT: Negative for congestion.   Respiratory: Positive for cough and shortness of breath.   Cardiovascular: Negative for chest pain.  Gastrointestinal: Negative for abdominal pain and anal bleeding.  Genitourinary: Negative for flank pain.  Musculoskeletal: Negative for back pain.  Skin: Negative for pallor.  Neurological: Positive for weakness.  Psychiatric/Behavioral: Negative for confusion.    Physical Exam Updated Vital Signs BP (!) 112/49   Pulse 75   Temp 98.4 F (36.9 C) (Oral)   Resp (!) 23   Ht 5\' 4"  (1.626 m)   Wt 63.5 kg   SpO2 100%   BMI 24.03 kg/m   Physical Exam Vitals and nursing note reviewed.  HENT:     Head: Normocephalic.  Cardiovascular:     Rate and Rhythm: Regular rhythm.  Pulmonary:     Comments: Harsh breath sounds with some rales at the bases. Chest:     Chest wall: No tenderness.  Musculoskeletal:     Comments: Mild edema bilateral lower extremities.  Skin:    General: Skin is warm.     Capillary Refill: Capillary refill takes less than 2 seconds.  Neurological:     Mental Status: She is alert.     ED Results / Procedures / Treatments   Labs (all labs ordered are listed, but only  abnormal results are displayed) Labs Reviewed  BRAIN NATRIURETIC PEPTIDE - Abnormal; Notable for the following components:      Result Value   B Natriuretic Peptide 815.0 (*)    All other components within normal limits  COMPREHENSIVE METABOLIC PANEL - Abnormal; Notable for the following components:   Glucose, Bld 128 (*)    BUN 44 (*)    Creatinine, Ser 2.23 (*)    Calcium 8.5 (*)    Total Protein 6.2 (*)    Albumin 3.2 (*)    AST 8 (*)    GFR calc non Af Amer 22 (*)    GFR calc Af Amer 25 (*)    All other components within normal limits  CBC WITH DIFFERENTIAL/PLATELET - Abnormal; Notable for the following components:   RBC 3.71 (*)    Hemoglobin 9.8 (*)    HCT 33.3 (*)    MCHC 29.4 (*)    All other components within normal limits  TROPONIN I (HIGH SENSITIVITY)  TROPONIN I (HIGH SENSITIVITY)    EKG EKG Interpretation  Date/Time:  Saturday Feb 07 2020 16:33:45 EDT Ventricular Rate:  74 PR Interval:    QRS Duration: 99 QT Interval:  407 QTC Calculation: 452 R Axis:   87 Text Interpretation: Sinus rhythm ST changes. baseline wander Confirmed by Davonna Belling [phone]) on 02/07/2020 4:47:02 PM   Radiology DG Chest Portable 1 View  Result Date: 02/07/2020 CLINICAL DATA:  Shortness of breath. EXAM: PORTABLE CHEST 1 VIEW COMPARISON:  Radiograph 12/25/2018. High-resolution chest CT 12/19/2017 FINDINGS: Bilateral calcified pleural plaques (right greater than left). Mild cardiomegaly which is similar to prior. Unchanged mediastinal contours with aortic atherosclerosis. Blunting of the costophrenic angles which is similar to prior exam and likely related to scarring, however small effusions difficult to exclude. Chronic bronchial and interstitial thickening is unchanged. No confluent airspace disease. No pneumothorax. No acute osseous abnormalities are seen. IMPRESSION: 1. Bilateral calcified pleural plaques consistent with prior spasticity exposure. Blunting of the costophrenic  angles is unchanged  and likely related to scarring, however small pleural effusions difficult to exclude. 2. Chronic interstitial and bronchial thickening consistent with interstitial lung disease, previously characterized on 12/19/2017 high-resolution chest CT. 3. Stable mild cardiomegaly.  Aortic Atherosclerosis (ICD10-I70.0). Electronically Signed   By: Keith Rake M.D.   On: 02/07/2020 17:01    Procedures Procedures (including critical care time)  Medications Ordered in ED Medications  albuterol (VENTOLIN HFA) 108 (90 Base) MCG/ACT inhaler 2 puff (2 puffs Inhalation Given 02/07/20 1713)  furosemide (LASIX) injection 40 mg (40 mg Intravenous Given 02/07/20 1944)    ED Course  I have reviewed the triage vital signs and the nursing notes.  Pertinent labs & imaging results that were available during my care of the patient were reviewed by me and considered in my medical decision making (see chart for details).    MDM Rules/Calculators/A&P                      Patient with shortness of breath.  History of COPD and CHF.  BNP is elevated.  Feels somewhat better after breathing treatment.  Is on her baseline oxygen at 4 L.  Will give IV Lasix in the ER and if tolerates well potentially discharge home Patient feels even better after IV Lasix.  Will discharge home.  Will allow PCP and cardiology to adjust medicines further.  Discharge home Final Clinical Impression(s) / ED Diagnoses Final diagnoses:  Acute on chronic congestive heart failure, unspecified heart failure type [facility])    Rx / DC Orders ED Discharge Orders    None       Davonna Belling, MD 02/07/20 2003

## 2020-02-07 NOTE — Discharge Instructions (Addendum)
Talk to your doctors about adjustment of your medications.  You have been given extra Lasix here.  Return for worsening shortness of breath.

## 2020-02-18 ENCOUNTER — Other Ambulatory Visit: Payer: Self-pay

## 2020-02-18 ENCOUNTER — Encounter (HOSPITAL_COMMUNITY): Payer: Self-pay

## 2020-02-18 ENCOUNTER — Emergency Department (HOSPITAL_COMMUNITY)
Admission: EM | Admit: 2020-02-18 | Discharge: 2020-02-18 | Disposition: A | Discharge status: Home / Self Care | Payer: Medicare HMO | Attending: Emergency Medicine | Admitting: Emergency Medicine

## 2020-02-18 ENCOUNTER — Emergency Department (HOSPITAL_COMMUNITY): Payer: Medicare HMO

## 2020-02-18 DIAGNOSIS — F17210 Nicotine dependence, cigarettes, uncomplicated: Secondary | ICD-10-CM | POA: Diagnosis not present

## 2020-02-18 DIAGNOSIS — Z79899 Other long term (current) drug therapy: Secondary | ICD-10-CM | POA: Diagnosis not present

## 2020-02-18 DIAGNOSIS — Z20822 Contact with and (suspected) exposure to covid-19: Secondary | ICD-10-CM | POA: Diagnosis not present

## 2020-02-18 DIAGNOSIS — E1122 Type 2 diabetes mellitus with diabetic chronic kidney disease: Secondary | ICD-10-CM | POA: Diagnosis not present

## 2020-02-18 DIAGNOSIS — I130 Hypertensive heart and chronic kidney disease with heart failure and stage 1 through stage 4 chronic kidney disease, or unspecified chronic kidney disease: Secondary | ICD-10-CM | POA: Insufficient documentation

## 2020-02-18 DIAGNOSIS — J441 Chronic obstructive pulmonary disease with (acute) exacerbation: Secondary | ICD-10-CM | POA: Insufficient documentation

## 2020-02-18 DIAGNOSIS — N183 Chronic kidney disease, stage 3 unspecified: Secondary | ICD-10-CM | POA: Diagnosis not present

## 2020-02-18 DIAGNOSIS — I5032 Chronic diastolic (congestive) heart failure: Secondary | ICD-10-CM | POA: Diagnosis not present

## 2020-02-18 DIAGNOSIS — Z7902 Long term (current) use of antithrombotics/antiplatelets: Secondary | ICD-10-CM | POA: Insufficient documentation

## 2020-02-18 DIAGNOSIS — R0602 Shortness of breath: Secondary | ICD-10-CM | POA: Diagnosis not present

## 2020-02-18 DIAGNOSIS — Z7982 Long term (current) use of aspirin: Secondary | ICD-10-CM | POA: Diagnosis not present

## 2020-02-18 LAB — CBC WITH DIFFERENTIAL/PLATELET
Abs Immature Granulocytes: 0.01 10*3/uL (ref 0.00–0.07)
Basophils Absolute: 0 10*3/uL (ref 0.0–0.1)
Basophils Relative: 1 %
Eosinophils Absolute: 0.1 10*3/uL (ref 0.0–0.5)
Eosinophils Relative: 2 %
HCT: 31.7 % — ABNORMAL LOW (ref 36.0–46.0)
Hemoglobin: 9.2 g/dL — ABNORMAL LOW (ref 12.0–15.0)
Immature Granulocytes: 0 %
Lymphocytes Relative: 16 %
Lymphs Abs: 0.7 10*3/uL (ref 0.7–4.0)
MCH: 25.9 pg — ABNORMAL LOW (ref 26.0–34.0)
MCHC: 29 g/dL — ABNORMAL LOW (ref 30.0–36.0)
MCV: 89.3 fL (ref 80.0–100.0)
Monocytes Absolute: 0.4 10*3/uL (ref 0.1–1.0)
Monocytes Relative: 9 %
Neutro Abs: 3.1 10*3/uL (ref 1.7–7.7)
Neutrophils Relative %: 72 %
Platelets: 269 10*3/uL (ref 150–400)
RBC: 3.55 MIL/uL — ABNORMAL LOW (ref 3.87–5.11)
RDW: 14.7 % (ref 11.5–15.5)
WBC: 4.2 10*3/uL (ref 4.0–10.5)
nRBC: 0 % (ref 0.0–0.2)

## 2020-02-18 LAB — COMPREHENSIVE METABOLIC PANEL
ALT: 7 U/L (ref 0–44)
AST: 7 U/L — ABNORMAL LOW (ref 15–41)
Albumin: 3.3 g/dL — ABNORMAL LOW (ref 3.5–5.0)
Alkaline Phosphatase: 49 U/L (ref 38–126)
Anion gap: 8 (ref 5–15)
BUN: 54 mg/dL — ABNORMAL HIGH (ref 8–23)
CO2: 26 mmol/L (ref 22–32)
Calcium: 8.6 mg/dL — ABNORMAL LOW (ref 8.9–10.3)
Chloride: 105 mmol/L (ref 98–111)
Creatinine, Ser: 2.35 mg/dL — ABNORMAL HIGH (ref 0.44–1.00)
GFR calc Af Amer: 24 mL/min — ABNORMAL LOW (ref >60)
GFR calc non Af Amer: 20 mL/min — ABNORMAL LOW (ref >60)
Glucose, Bld: 191 mg/dL — ABNORMAL HIGH (ref 70–99)
Potassium: 4.4 mmol/L (ref 3.5–5.1)
Sodium: 139 mmol/L (ref 135–145)
Total Bilirubin: 0.4 mg/dL (ref 0.3–1.2)
Total Protein: 5.9 g/dL — ABNORMAL LOW (ref 6.5–8.1)

## 2020-02-18 LAB — SARS CORONAVIRUS 2 BY RT PCR (HOSPITAL ORDER, PERFORMED IN ~~LOC~~ HOSPITAL LAB): SARS Coronavirus 2: NEGATIVE

## 2020-02-18 LAB — BRAIN NATRIURETIC PEPTIDE: B Natriuretic Peptide: 729 pg/mL — ABNORMAL HIGH (ref 0.0–100.0)

## 2020-02-18 LAB — TROPONIN I (HIGH SENSITIVITY)
Troponin I (High Sensitivity): 6 ng/L (ref <18)
Troponin I (High Sensitivity): 7 ng/L (ref <18)

## 2020-02-18 LAB — CBG MONITORING, ED: Glucose-Capillary: 187 mg/dL — ABNORMAL HIGH (ref 70–99)

## 2020-02-18 MED ORDER — MAGNESIUM SULFATE 2 GM/50ML IV SOLN
2.0000 g | Freq: Once | INTRAVENOUS | Status: AC
  Administered 2020-02-18: 2 g via INTRAVENOUS
  Filled 2020-02-18: qty 50

## 2020-02-18 MED ORDER — ALBUTEROL SULFATE (2.5 MG/3ML) 0.083% IN NEBU
2.5000 mg | INHALATION_SOLUTION | Freq: Once | RESPIRATORY_TRACT | Status: AC
  Administered 2020-02-18: 2.5 mg via RESPIRATORY_TRACT
  Filled 2020-02-18: qty 3

## 2020-02-18 MED ORDER — IPRATROPIUM-ALBUTEROL 0.5-2.5 (3) MG/3ML IN SOLN
3.0000 mL | Freq: Once | RESPIRATORY_TRACT | Status: AC
  Administered 2020-02-18: 3 mL via RESPIRATORY_TRACT
  Filled 2020-02-18: qty 3

## 2020-02-18 MED ORDER — METHYLPREDNISOLONE SODIUM SUCC 125 MG IJ SOLR
125.0000 mg | Freq: Once | INTRAMUSCULAR | Status: AC
  Administered 2020-02-18: 125 mg via INTRAVENOUS
  Filled 2020-02-18: qty 2

## 2020-02-18 MED ORDER — PREDNISONE 10 MG PO TABS
20.0000 mg | ORAL_TABLET | Freq: Every day | ORAL | 0 refills | Status: DC
Start: 2020-02-18 — End: 2020-03-27

## 2020-02-18 MED ORDER — SODIUM CHLORIDE 0.9 % IV SOLN
INTRAVENOUS | Status: DC | PRN
  Administered 2020-02-18: 250 mL via INTRAVENOUS

## 2020-02-18 MED ORDER — FUROSEMIDE 10 MG/ML IJ SOLN
40.0000 mg | Freq: Once | INTRAMUSCULAR | Status: AC
  Administered 2020-02-18: 40 mg via INTRAVENOUS
  Filled 2020-02-18: qty 4

## 2020-02-18 NOTE — Discharge Instructions (Addendum)
Follow-up with your doctor next week.  Return if any problems

## 2020-02-18 NOTE — ED Provider Notes (Signed)
[facility] EMERGENCY DEPARTMENT Provider Note   CSN: [id number] Arrival date & time: 02/18/20  1433     History Chief Complaint  Patient presents with  . Shortness of Breath    Erica [name] is a 69 y.o. female.  Patient complains of shortness of breath.  Patient has a history of COPD and heart failure.  She has had a mild dry cough  The history is provided by the patient. No language interpreter was used.  Shortness of Breath Severity:  Moderate Onset quality:  Sudden Timing:  Constant Progression:  Worsening Chronicity:  Recurrent Context: activity   Relieved by:  Nothing Associated symptoms: no abdominal pain, no chest pain, no cough, no headaches and no rash        Past Medical History:  Diagnosis Date  . Arthritis   . CHF (congestive heart failure) ([place])   . Chronic back pain   . COPD (chronic obstructive pulmonary disease) (HCC)    2L home O2  . Diabetes mellitus without complication ([place])   . Hyperlipidemia   . Hypertension   . Stroke (cerebrum) [facility])     Patient Active Problem List   Diagnosis Date Noted  . Anemia in stage 4 chronic kidney disease ([place]) 01/07/2020  . Abnormal mammogram 01/07/2020  . Anemia due to stage 4 chronic kidney disease ([place]) 12/31/2019  . Health care maintenance 12/31/2019  . Weight loss 12/31/2019  . Acute hyperkalemia 05/21/2019  . Acute hypoxemic respiratory failure ([place]) 09/03/2018  . Acute diastolic (congestive) heart failure ([place]) 03/09/2018  . AVM (arteriovenous malformation) of duodenum, acquired   . GIB (gastrointestinal bleeding) 01/23/2018  . CKD (chronic kidney disease) 01/23/2018  . Hyperkalemia 12/21/2017  . Hyperlipidemia 12/18/2017  . Hypomagnesemia 12/17/2017  . Acute renal failure superimposed on stage 3 chronic kidney disease ([place]) 12/07/2017  . Acute on chronic respiratory failure with hypoxia ([place]) 12/07/2017  . COPD with acute exacerbation ([place]) 12/07/2017  . Acute diastolic CHF (congestive  heart failure) ([place]) 09/19/2017  . Iron deficiency anemia due to chronic blood loss   . Acute diverticulitis 08/29/2017  . Heme positive stool   . Symptomatic anemia   . GI bleed 08/27/2017  . Elevated troponin 08/27/2017  . (HFpEF) heart failure with preserved ejection fraction ([place]) 08/27/2017  . Acute dyspnea   . Acute on chronic respiratory failure ([place]) 02/11/2017  . Acute on chronic diastolic CHF (congestive heart failure) ([place]) 02/11/2017  . Benign essential HTN 11/20/2015  . GERD (gastroesophageal reflux disease) 11/20/2015  . Tobacco abuse 11/20/2015  . Type 2 diabetes mellitus with hyperglycemia ([place]) 11/20/2015  . COPD exacerbation ([place]) 09/05/2015  . Chronic pain syndrome 09/05/2015  . Hypertension 09/05/2015  . Diabetes mellitus type 2, controlled ([place]) 09/05/2015  . Chronic respiratory failure ([place]) 09/05/2015  . OLECRANON BURSITIS, LEFT 08/04/2010    Past Surgical History:  Procedure Laterality Date  . ABDOMINAL HYSTERECTOMY    . colonoscopy  2009   Dr. Gala Romney: cluster of diminutive rectal polyps and 2 diminutive polyps in rectosigmoid junction, hyperplastic  . COLONOSCOPY WITH PROPOFOL N/A 10/18/2017   Procedure: COLONOSCOPY WITH PROPOFOL;  Surgeon: Daneil Dolin, MD;  Location: [facility] ENDO SUITE;  Service: Endoscopy;  Laterality: N/A;  9:30am  . ESOPHAGOGASTRODUODENOSCOPY N/A 01/25/2018   Procedure: ESOPHAGOGASTRODUODENOSCOPY (EGD);  Surgeon: Danie Binder, MD;  Location: [facility] ENDO SUITE;  Service: Endoscopy;  Laterality: N/A;  . ESOPHAGOGASTRODUODENOSCOPY (EGD) WITH PROPOFOL N/A 08/29/2017   Dr. Gala Romney with propofol: small hiatal hernia. 4cm fundal diverticulum.   Marland Kitchen  GIVENS CAPSULE STUDY N/A 01/25/2018   Procedure: GIVENS CAPSULE STUDY;  Surgeon: Danie Binder, MD;  Location: [facility] ENDO SUITE;  Service: Endoscopy;  Laterality: N/A;  . left hand middle finger     in an accident at work, amputated     OB History    Gravida  0   Para  0   Term  0   Preterm  0   AB    0   Living        SAB  0   TAB  0   Ectopic  0   Multiple      Live Births              Family History  Problem Relation Age of Onset  . CVA Mother 67  . Cancer Neg Hx   . Colon cancer Neg Hx   . Breast cancer Neg Hx     Social History   Tobacco Use  . Smoking status: Current Some Day Smoker    Packs/day: 0.25    Years: 52.00    Pack years: 13.00    Types: Cigarettes    Last attempt to quit: 09/23/2017    Years since quitting: 2.4  . Smokeless tobacco: Never Used  Substance Use Topics  . Alcohol use: No  . Drug use: No    Home Medications Prior to Admission medications   Medication Sig Start Date End Date Taking? Authorizing Provider  amLODipine (NORVASC) 10 MG tablet  10/31/19   [provider]  aspirin EC 81 MG tablet Take 162 mg by mouth daily.    [provider]  atorvastatin (LIPITOR) 80 MG tablet Take 80 mg by mouth daily.  12/04/16   [provider]  budesonide-formoterol (SYMBICORT) 160-4.5 MCG/ACT inhaler Inhale 2 puffs into the lungs 2 (two) times daily. 11/23/15   Barton Dubois, MD  buPROPion [facility] SR) 150 MG 12 hr tablet Take 150 mg by mouth 2 (two) times daily. 12/04/16   [provider]  carvedilol (COREG) 12.5 MG tablet  10/31/19   [provider]  clopidogrel (PLAVIX) 75 MG tablet Take 75 mg by mouth daily.  03/29/18   [provider]  famotidine (PEPCID) 20 MG tablet Take 20 mg by mouth daily.  03/29/18   [provider]  ferrous sulfate 325 (65 FE) MG EC tablet Take 1 tablet (325 mg total) by mouth 2 (two) times daily. 01/25/18 12/02/19  Erline Hau, MD  ipratropium-albuterol (DUONEB) 0.5-2.5 (3) MG/3ML SOLN Take 3 mLs by nebulization every 6 (six) hours as needed (for shortness of breath).    [provider]  Linaclotide Rolan Lipa) 145 MCG CAPS capsule Take 145 mcg by mouth daily as needed (for constipation).     [provider]  nicotine (NICODERM CQ -  DOSED IN MG/24 HOURS) 14 mg/24hr patch  11/03/19   [provider]  omega-3 acid ethyl esters (LOVAZA) 1 g capsule Take 1 g by mouth daily.  12/07/16   [provider]  pantoprazole (PROTONIX) 40 MG tablet Take 1 tablet (40 mg total) by mouth daily. 01/25/18   Isaac Bliss, Rayford Halsted, MD  predniSONE (DELTASONE) 10 MG tablet Take 2 tablets (20 mg total) by mouth daily. 02/18/20   Milton Ferguson, MD  saxagliptin HCl (ONGLYZA) 5 MG TABS tablet Take 5 mg by mouth daily. Reported on 09/22/2015    [provider]  SPIRIVA HANDIHALER 18 MCG inhalation capsule Take 1 puff by mouth daily.  10/31/17   [provider]  torsemide (DEMADEX) 20 MG tablet Take 1 tablet (20 mg total) by mouth daily. Take 1 tablet on Monday, Wednesday, and Friday only. 02/26/19   Arnoldo Lenis, MD  valsartan-hydrochlorothiazide (DIOVAN-HCT) 160-25 MG tablet  10/31/19   [provider]    Allergies    Patient has no known allergies.  Review of Systems   Review of Systems  Constitutional: Negative for appetite change and fatigue.  HENT: Negative for congestion, ear discharge and sinus pressure.   Eyes: Negative for discharge.  Respiratory: Positive for shortness of breath. Negative for cough.   Cardiovascular: Negative for chest pain.  Gastrointestinal: Negative for abdominal pain and diarrhea.  Genitourinary: Negative for frequency and hematuria.  Musculoskeletal: Negative for back pain.  Skin: Negative for rash.  Neurological: Negative for seizures and headaches.  Psychiatric/Behavioral: Negative for hallucinations.    Physical Exam Updated Vital Signs Ht 5\' 4"  (1.626 m)   Wt 65.8 kg   BMI 24.89 kg/m   Physical Exam Vitals and nursing note reviewed.  Constitutional:      Appearance: She is well-developed.  HENT:     Head: Normocephalic.     Nose: Nose normal.  Eyes:     General: No scleral icterus.    Conjunctiva/sclera: Conjunctivae normal.  Neck:     Thyroid: No  thyromegaly.  Cardiovascular:     Rate and Rhythm: Normal rate and regular rhythm.     Heart sounds: No murmur. No friction rub. No gallop.   Pulmonary:     Breath sounds: No stridor. Wheezing present. No rales.  Chest:     Chest wall: No tenderness.  Abdominal:     General: There is no distension.     Tenderness: There is no abdominal tenderness. There is no rebound.  Musculoskeletal:        General: Normal range of motion.     Cervical back: Neck supple.  Lymphadenopathy:     Cervical: No cervical adenopathy.  Skin:    Findings: No erythema or rash.  Neurological:     Mental Status: She is alert and oriented to person, place, and time.     Motor: No abnormal muscle tone.     Coordination: Coordination normal.  Psychiatric:        Behavior: Behavior normal.     ED Results / Procedures / Treatments   Labs (all labs ordered are listed, but only abnormal results are displayed) Labs Reviewed  CBC WITH DIFFERENTIAL/PLATELET - Abnormal; Notable for the following components:      Result Value   RBC 3.55 (*)    Hemoglobin 9.2 (*)    HCT 31.7 (*)    MCH 25.9 (*)    MCHC 29.0 (*)    All other components within normal limits  COMPREHENSIVE METABOLIC PANEL - Abnormal; Notable for the following components:   Glucose, Bld 191 (*)    BUN 54 (*)    Creatinine, Ser 2.35 (*)    Calcium 8.6 (*)    Total Protein 5.9 (*)    Albumin 3.3 (*)    AST 7 (*)    GFR calc non Af Amer 20 (*)    GFR calc Af Amer 24 (*)    All other components within normal limits  BRAIN NATRIURETIC PEPTIDE - Abnormal; Notable for the following components:   B Natriuretic Peptide 729.0 (*)    All other components within normal limits  CBG MONITORING, ED - Abnormal; Notable for the  following components:   Glucose-Capillary 187 (*)    All other components within normal limits  SARS CORONAVIRUS 2 BY RT PCR (HOSPITAL ORDER, [place] [facility])  TROPONIN I (HIGH SENSITIVITY)  TROPONIN I (HIGH  SENSITIVITY)    EKG EKG Interpretation  Date/Time:  Wednesday Feb 18 2020 16:37:22 EDT Ventricular Rate:  82 PR Interval:    QRS Duration: 103 QT Interval:  384 QTC Calculation: 449 R Axis:   85 Text Interpretation: Sinus rhythm Consider right atrial enlargement Borderline right axis deviation Probable anteroseptal infarct, old Repol abnrm suggests ischemia, anterolateral Confirmed by Milton Ferguson [phone]) on 02/18/2020 7:17:39 PM   Radiology DG Chest Portable 1 View  Result Date: 02/18/2020 CLINICAL DATA:  Shortness of breath. EXAM: PORTABLE CHEST 1 VIEW COMPARISON:  Feb 07, 2020. FINDINGS: Stable cardiomegaly. Stable calcified bilateral pleural plaques. Atherosclerosis of thoracic aorta is noted. Chronic interstitial densities are noted throughout both lungs. No pneumothorax is noted. Probable scarring is noted in both lung bases. Bony thorax is unremarkable. IMPRESSION: Aortic atherosclerosis. Stable calcified bilateral pleural plaques are noted consistent with prior asbestos exposure. Stable chronic interstitial densities are noted throughout both lungs. Electronically Signed   By: Marijo Conception M.D.   On: 02/18/2020 15:12    Procedures Procedures (including critical care time)  Medications Ordered in ED Medications  0.9 %  sodium chloride infusion (250 mLs Intravenous New Bag/Given 02/18/20 1759)  methylPREDNISolone sodium succinate (SOLU-MEDROL) 125 mg/2 mL injection 125 mg (125 mg Intravenous Given 02/18/20 1521)  furosemide (LASIX) injection 40 mg (40 mg Intravenous Given 02/18/20 1800)  magnesium sulfate IVPB 2 g 50 mL (2 g Intravenous New Bag/Given 02/18/20 1759)  ipratropium-albuterol (DUONEB) 0.5-2.5 (3) MG/3ML nebulizer solution 3 mL (3 mLs Nebulization Given 02/18/20 1826)  albuterol (PROVENTIL) (2.5 MG/3ML) 0.083% nebulizer solution 2.5 mg (2.5 mg Nebulization Given 02/18/20 1826)    ED Course  I have reviewed the triage vital signs and the nursing notes.  Pertinent  labs & imaging results that were available during my care of the patient were reviewed by me and considered in my medical decision making (see chart for details).    CRITICAL CARE Performed by: Milton Ferguson Total critical care time: 45 minutes Critical care time was exclusive of separately billable procedures and treating other patients. Critical care was necessary to treat or prevent imminent or life-threatening deterioration. Critical care was time spent personally by me on the following activities: development of treatment plan with patient and/or surrogate as well as nursing, discussions with consultants, evaluation of patient's response to treatment, examination of patient, obtaining history from patient or surrogate, ordering and performing treatments and interventions, ordering and review of laboratory studies, ordering and review of radiographic studies, pulse oximetry and re-evaluation of patient's condition.  MDM Rules/Calculators/A&P                      Patient with COPD exacerbation.  Patient improved with neb treatments.  She was also given steroids.  She will be discharged home with steroids      This patient presents to the ED for concern of shortness of breath this involves an extensive number of treatment options, and is a complaint that carries with it a high risk of complications and morbidity.  The differential diagnosis includes COPD congestive heart failure pneumonia   Lab Tests:   I Ordered, reviewed, and interpreted labs, which included CBC chemistries BNP.  Patient has some anemia and BNP is elevated  from heart failure  Medicines ordered:  I ordered medication albuterol neb treatments and steroids Imaging Studies ordered:   I ordered imaging studies which included chest x-ray  and  I independently visualized and interpreted imaging which showed COPD  Additional history obtained:   Additional history obtained from old records  Previous records  obtained and reviewed   Consultations Obtained:   Reevaluation:  After the interventions stated above, I reevaluated the patient and found improved  Critical Interventions:  .   Final Clinical Impression(s) / ED Diagnoses Final diagnoses:  COPD exacerbation ([place])    Rx / DC Orders ED Discharge Orders         Ordered    predniSONE (DELTASONE) 10 MG tablet  Daily     02/18/20 1918           Milton Ferguson, MD 02/18/20 1925

## 2020-02-18 NOTE — ED Triage Notes (Signed)
Pt presents to ED with complaints of SOB x couple of days. Pt denies fever. Pt is chronically on 4L of O2 sat home.

## 2020-02-23 DIAGNOSIS — I5032 Chronic diastolic (congestive) heart failure: Secondary | ICD-10-CM | POA: Diagnosis not present

## 2020-02-23 DIAGNOSIS — I130 Hypertensive heart and chronic kidney disease with heart failure and stage 1 through stage 4 chronic kidney disease, or unspecified chronic kidney disease: Secondary | ICD-10-CM | POA: Diagnosis not present

## 2020-02-23 DIAGNOSIS — E1122 Type 2 diabetes mellitus with diabetic chronic kidney disease: Secondary | ICD-10-CM | POA: Diagnosis not present

## 2020-02-23 DIAGNOSIS — N182 Chronic kidney disease, stage 2 (mild): Secondary | ICD-10-CM | POA: Diagnosis not present

## 2020-03-05 DIAGNOSIS — E1122 Type 2 diabetes mellitus with diabetic chronic kidney disease: Secondary | ICD-10-CM | POA: Diagnosis not present

## 2020-03-05 DIAGNOSIS — J449 Chronic obstructive pulmonary disease, unspecified: Secondary | ICD-10-CM | POA: Diagnosis not present

## 2020-03-05 DIAGNOSIS — N183 Chronic kidney disease, stage 3 unspecified: Secondary | ICD-10-CM | POA: Diagnosis not present

## 2020-03-05 DIAGNOSIS — R5383 Other fatigue: Secondary | ICD-10-CM | POA: Diagnosis not present

## 2020-03-05 DIAGNOSIS — E119 Type 2 diabetes mellitus without complications: Secondary | ICD-10-CM | POA: Diagnosis not present

## 2020-03-05 DIAGNOSIS — Z13220 Encounter for screening for lipoid disorders: Secondary | ICD-10-CM | POA: Diagnosis not present

## 2020-03-05 DIAGNOSIS — K219 Gastro-esophageal reflux disease without esophagitis: Secondary | ICD-10-CM | POA: Diagnosis not present

## 2020-03-11 DIAGNOSIS — E139 Other specified diabetes mellitus without complications: Secondary | ICD-10-CM | POA: Diagnosis not present

## 2020-03-11 DIAGNOSIS — D649 Anemia, unspecified: Secondary | ICD-10-CM | POA: Diagnosis not present

## 2020-03-11 DIAGNOSIS — J441 Chronic obstructive pulmonary disease with (acute) exacerbation: Secondary | ICD-10-CM | POA: Diagnosis not present

## 2020-03-11 DIAGNOSIS — Z716 Tobacco abuse counseling: Secondary | ICD-10-CM | POA: Diagnosis not present

## 2020-03-11 DIAGNOSIS — I509 Heart failure, unspecified: Secondary | ICD-10-CM | POA: Diagnosis not present

## 2020-03-11 DIAGNOSIS — N189 Chronic kidney disease, unspecified: Secondary | ICD-10-CM | POA: Diagnosis not present

## 2020-03-11 DIAGNOSIS — J449 Chronic obstructive pulmonary disease, unspecified: Secondary | ICD-10-CM | POA: Diagnosis not present

## 2020-03-25 DIAGNOSIS — Z716 Tobacco abuse counseling: Secondary | ICD-10-CM | POA: Diagnosis not present

## 2020-03-25 DIAGNOSIS — J449 Chronic obstructive pulmonary disease, unspecified: Secondary | ICD-10-CM | POA: Diagnosis not present

## 2020-03-25 DIAGNOSIS — D649 Anemia, unspecified: Secondary | ICD-10-CM | POA: Diagnosis not present

## 2020-03-25 DIAGNOSIS — I509 Heart failure, unspecified: Secondary | ICD-10-CM | POA: Diagnosis not present

## 2020-03-25 DIAGNOSIS — J189 Pneumonia, unspecified organism: Secondary | ICD-10-CM | POA: Diagnosis not present

## 2020-03-25 DIAGNOSIS — R0902 Hypoxemia: Secondary | ICD-10-CM | POA: Diagnosis not present

## 2020-03-25 DIAGNOSIS — N184 Chronic kidney disease, stage 4 (severe): Secondary | ICD-10-CM | POA: Diagnosis not present

## 2020-03-25 DIAGNOSIS — E139 Other specified diabetes mellitus without complications: Secondary | ICD-10-CM | POA: Diagnosis not present

## 2020-03-27 ENCOUNTER — Emergency Department (HOSPITAL_COMMUNITY): Payer: Medicare HMO

## 2020-03-27 ENCOUNTER — Other Ambulatory Visit: Payer: Self-pay

## 2020-03-27 ENCOUNTER — Inpatient Hospital Stay (HOSPITAL_COMMUNITY)
Admission: EM | Admit: 2020-03-27 | Discharge: 2020-03-30 | DRG: 189 | Disposition: A | Discharge status: Home / Self Care | Payer: Medicare HMO | Attending: Internal Medicine | Admitting: Internal Medicine

## 2020-03-27 ENCOUNTER — Encounter (HOSPITAL_COMMUNITY): Payer: Self-pay | Admitting: Emergency Medicine

## 2020-03-27 DIAGNOSIS — J9622 Acute and chronic respiratory failure with hypercapnia: Secondary | ICD-10-CM | POA: Diagnosis not present

## 2020-03-27 DIAGNOSIS — I248 Other forms of acute ischemic heart disease: Secondary | ICD-10-CM | POA: Diagnosis present

## 2020-03-27 DIAGNOSIS — J9602 Acute respiratory failure with hypercapnia: Secondary | ICD-10-CM | POA: Diagnosis not present

## 2020-03-27 DIAGNOSIS — I5033 Acute on chronic diastolic (congestive) heart failure: Secondary | ICD-10-CM | POA: Diagnosis not present

## 2020-03-27 DIAGNOSIS — Z7982 Long term (current) use of aspirin: Secondary | ICD-10-CM | POA: Diagnosis not present

## 2020-03-27 DIAGNOSIS — I2729 Other secondary pulmonary hypertension: Secondary | ICD-10-CM | POA: Diagnosis present

## 2020-03-27 DIAGNOSIS — J9601 Acute respiratory failure with hypoxia: Secondary | ICD-10-CM

## 2020-03-27 DIAGNOSIS — I959 Hypotension, unspecified: Secondary | ICD-10-CM | POA: Diagnosis present

## 2020-03-27 DIAGNOSIS — J441 Chronic obstructive pulmonary disease with (acute) exacerbation: Secondary | ICD-10-CM

## 2020-03-27 DIAGNOSIS — E785 Hyperlipidemia, unspecified: Secondary | ICD-10-CM | POA: Diagnosis present

## 2020-03-27 DIAGNOSIS — J9621 Acute and chronic respiratory failure with hypoxia: Secondary | ICD-10-CM | POA: Diagnosis present

## 2020-03-27 DIAGNOSIS — F17210 Nicotine dependence, cigarettes, uncomplicated: Secondary | ICD-10-CM | POA: Diagnosis present

## 2020-03-27 DIAGNOSIS — I2584 Coronary atherosclerosis due to calcified coronary lesion: Secondary | ICD-10-CM | POA: Diagnosis not present

## 2020-03-27 DIAGNOSIS — I110 Hypertensive heart disease with heart failure: Secondary | ICD-10-CM | POA: Diagnosis not present

## 2020-03-27 DIAGNOSIS — Z9981 Dependence on supplemental oxygen: Secondary | ICD-10-CM

## 2020-03-27 DIAGNOSIS — R339 Retention of urine, unspecified: Secondary | ICD-10-CM | POA: Diagnosis present

## 2020-03-27 DIAGNOSIS — J849 Interstitial pulmonary disease, unspecified: Secondary | ICD-10-CM | POA: Diagnosis not present

## 2020-03-27 DIAGNOSIS — I2510 Atherosclerotic heart disease of native coronary artery without angina pectoris: Secondary | ICD-10-CM | POA: Diagnosis not present

## 2020-03-27 DIAGNOSIS — E1122 Type 2 diabetes mellitus with diabetic chronic kidney disease: Secondary | ICD-10-CM | POA: Diagnosis present

## 2020-03-27 DIAGNOSIS — I130 Hypertensive heart and chronic kidney disease with heart failure and stage 1 through stage 4 chronic kidney disease, or unspecified chronic kidney disease: Secondary | ICD-10-CM | POA: Diagnosis present

## 2020-03-27 DIAGNOSIS — J449 Chronic obstructive pulmonary disease, unspecified: Secondary | ICD-10-CM | POA: Diagnosis not present

## 2020-03-27 DIAGNOSIS — D649 Anemia, unspecified: Secondary | ICD-10-CM | POA: Diagnosis present

## 2020-03-27 DIAGNOSIS — Z20822 Contact with and (suspected) exposure to covid-19: Secondary | ICD-10-CM | POA: Diagnosis present

## 2020-03-27 DIAGNOSIS — Z8673 Personal history of transient ischemic attack (TIA), and cerebral infarction without residual deficits: Secondary | ICD-10-CM | POA: Diagnosis not present

## 2020-03-27 DIAGNOSIS — E1129 Type 2 diabetes mellitus with other diabetic kidney complication: Secondary | ICD-10-CM | POA: Diagnosis not present

## 2020-03-27 DIAGNOSIS — D631 Anemia in chronic kidney disease: Secondary | ICD-10-CM | POA: Diagnosis not present

## 2020-03-27 DIAGNOSIS — I509 Heart failure, unspecified: Secondary | ICD-10-CM | POA: Diagnosis not present

## 2020-03-27 DIAGNOSIS — N184 Chronic kidney disease, stage 4 (severe): Secondary | ICD-10-CM | POA: Diagnosis not present

## 2020-03-27 DIAGNOSIS — I272 Pulmonary hypertension, unspecified: Secondary | ICD-10-CM | POA: Diagnosis not present

## 2020-03-27 DIAGNOSIS — Z823 Family history of stroke: Secondary | ICD-10-CM

## 2020-03-27 DIAGNOSIS — Z7902 Long term (current) use of antithrombotics/antiplatelets: Secondary | ICD-10-CM

## 2020-03-27 DIAGNOSIS — J90 Pleural effusion, not elsewhere classified: Secondary | ICD-10-CM | POA: Diagnosis not present

## 2020-03-27 DIAGNOSIS — Z79899 Other long term (current) drug therapy: Secondary | ICD-10-CM | POA: Diagnosis not present

## 2020-03-27 DIAGNOSIS — R0602 Shortness of breath: Secondary | ICD-10-CM | POA: Diagnosis not present

## 2020-03-27 DIAGNOSIS — Z7951 Long term (current) use of inhaled steroids: Secondary | ICD-10-CM

## 2020-03-27 DIAGNOSIS — I5031 Acute diastolic (congestive) heart failure: Secondary | ICD-10-CM | POA: Diagnosis not present

## 2020-03-27 DIAGNOSIS — I5032 Chronic diastolic (congestive) heart failure: Secondary | ICD-10-CM | POA: Diagnosis not present

## 2020-03-27 DIAGNOSIS — I50813 Acute on chronic right heart failure: Secondary | ICD-10-CM | POA: Diagnosis not present

## 2020-03-27 DIAGNOSIS — I517 Cardiomegaly: Secondary | ICD-10-CM | POA: Diagnosis not present

## 2020-03-27 HISTORY — DX: Unspecified diastolic (congestive) heart failure: I50.30

## 2020-03-27 HISTORY — DX: Essential (primary) hypertension: I10

## 2020-03-27 HISTORY — DX: Supraventricular tachycardia: I47.1

## 2020-03-27 HISTORY — DX: Interstitial pulmonary disease, unspecified: J84.9

## 2020-03-27 HISTORY — DX: Chronic kidney disease, stage 4 (severe): N18.4

## 2020-03-27 HISTORY — DX: Type 2 diabetes mellitus without complications: E11.9

## 2020-03-27 HISTORY — DX: Personal history of transient ischemic attack (TIA), and cerebral infarction without residual deficits: Z86.73

## 2020-03-27 HISTORY — DX: Personal history of other diseases of the digestive system: Z87.19

## 2020-03-27 HISTORY — DX: Supraventricular tachycardia, unspecified: I47.10

## 2020-03-27 LAB — BLOOD GAS, VENOUS
Acid-Base Excess: 9.5 mmol/L — ABNORMAL HIGH (ref 0.0–2.0)
Acid-Base Excess: 9.8 mmol/L — ABNORMAL HIGH (ref 0.0–2.0)
Acid-Base Excess: 10.3 mmol/L — ABNORMAL HIGH (ref 0.0–2.0)
Bicarbonate: 31 mmol/L — ABNORMAL HIGH (ref 20.0–28.0)
Bicarbonate: 31 mmol/L — ABNORMAL HIGH (ref 20.0–28.0)
Bicarbonate: 30.7 mmol/L — ABNORMAL HIGH (ref 20.0–28.0)
Drawn by: 442
FIO2: 21
FIO2: 80
FIO2: 65
O2 Saturation: 61.9 %
O2 Saturation: 65.1 %
O2 Saturation: 55.7 %
Patient temperature: 37
Patient temperature: 37
Patient temperature: 37
pCO2, Ven: 61.6 mmHg — ABNORMAL HIGH (ref 44.0–60.0)
pCO2, Ven: 72.3 mmHg (ref 44.0–60.0)
pCO2, Ven: 80.4 mmHg (ref 44.0–60.0)
pH, Ven: 7.372 (ref 7.250–7.430)
pH, Ven: 7.318 (ref 7.250–7.430)
pH, Ven: 7.286 (ref 7.250–7.430)
pO2, Ven: 35.8 mmHg (ref 32.0–45.0)
pO2, Ven: 41.5 mmHg (ref 32.0–45.0)
pO2, Ven: 33 mmHg (ref 32.0–45.0)

## 2020-03-27 LAB — BASIC METABOLIC PANEL
Anion gap: 12 (ref 5–15)
BUN: 45 mg/dL — ABNORMAL HIGH (ref 8–23)
CO2: 32 mmol/L (ref 22–32)
Calcium: 7.9 mg/dL — ABNORMAL LOW (ref 8.9–10.3)
Chloride: 98 mmol/L (ref 98–111)
Creatinine, Ser: 2.51 mg/dL — ABNORMAL HIGH (ref 0.44–1.00)
GFR calc Af Amer: 22 mL/min — ABNORMAL LOW (ref >60)
GFR calc non Af Amer: 19 mL/min — ABNORMAL LOW (ref >60)
Glucose, Bld: 100 mg/dL — ABNORMAL HIGH (ref 70–99)
Potassium: 3.9 mmol/L (ref 3.5–5.1)
Sodium: 142 mmol/L (ref 135–145)

## 2020-03-27 LAB — TROPONIN I (HIGH SENSITIVITY)
Troponin I (High Sensitivity): 52 ng/L — ABNORMAL HIGH (ref <18)
Troponin I (High Sensitivity): 48 ng/L — ABNORMAL HIGH (ref <18)

## 2020-03-27 LAB — CBC WITH DIFFERENTIAL/PLATELET
Abs Immature Granulocytes: 0.01 10*3/uL (ref 0.00–0.07)
Basophils Absolute: 0 10*3/uL (ref 0.0–0.1)
Basophils Relative: 0 %
Eosinophils Absolute: 0.1 10*3/uL (ref 0.0–0.5)
Eosinophils Relative: 1 %
HCT: 41.5 % (ref 36.0–46.0)
Hemoglobin: 12.4 g/dL (ref 12.0–15.0)
Immature Granulocytes: 0 %
Lymphocytes Relative: 14 %
Lymphs Abs: 0.8 10*3/uL (ref 0.7–4.0)
MCH: 26.8 pg (ref 26.0–34.0)
MCHC: 29.9 g/dL — ABNORMAL LOW (ref 30.0–36.0)
MCV: 89.8 fL (ref 80.0–100.0)
Monocytes Absolute: 0.5 10*3/uL (ref 0.1–1.0)
Monocytes Relative: 9 %
Neutro Abs: 4.1 10*3/uL (ref 1.7–7.7)
Neutrophils Relative %: 76 %
Platelets: 144 10*3/uL — ABNORMAL LOW (ref 150–400)
RBC: 4.62 MIL/uL (ref 3.87–5.11)
RDW: 15.1 % (ref 11.5–15.5)
WBC: 5.4 10*3/uL (ref 4.0–10.5)
nRBC: 0 % (ref 0.0–0.2)

## 2020-03-27 LAB — BRAIN NATRIURETIC PEPTIDE: B Natriuretic Peptide: 783 pg/mL — ABNORMAL HIGH (ref 0.0–100.0)

## 2020-03-27 LAB — SARS CORONAVIRUS 2 BY RT PCR (HOSPITAL ORDER, PERFORMED IN ~~LOC~~ HOSPITAL LAB): SARS Coronavirus 2: NEGATIVE

## 2020-03-27 MED ORDER — HEPARIN SODIUM (PORCINE) 5000 UNIT/ML IJ SOLN
5000.0000 [IU] | Freq: Three times a day (TID) | INTRAMUSCULAR | Status: DC
  Administered 2020-03-27 – 2020-03-30 (×8): 5000 [IU] via SUBCUTANEOUS
  Filled 2020-03-27 (×8): qty 1

## 2020-03-27 MED ORDER — UMECLIDINIUM BROMIDE 62.5 MCG/INH IN AEPB
1.0000 | INHALATION_SPRAY | Freq: Every day | RESPIRATORY_TRACT | Status: DC
  Administered 2020-03-28 – 2020-03-30 (×3): 1 via RESPIRATORY_TRACT
  Filled 2020-03-27: qty 7

## 2020-03-27 MED ORDER — OMEGA-3-ACID ETHYL ESTERS 1 G PO CAPS
1.0000 g | ORAL_CAPSULE | Freq: Every day | ORAL | Status: DC
  Administered 2020-03-29 – 2020-03-30 (×2): 1 g via ORAL
  Filled 2020-03-27 (×3): qty 1

## 2020-03-27 MED ORDER — FAMOTIDINE 20 MG PO TABS
20.0000 mg | ORAL_TABLET | Freq: Every day | ORAL | Status: DC
  Administered 2020-03-28 – 2020-03-30 (×3): 20 mg via ORAL
  Filled 2020-03-27 (×3): qty 1

## 2020-03-27 MED ORDER — FUROSEMIDE 10 MG/ML IJ SOLN: 80.0000 mg | Freq: Every day | INTRAMUSCULAR | Status: DC

## 2020-03-27 MED ORDER — ALBUTEROL SULFATE HFA 108 (90 BASE) MCG/ACT IN AERS: 1.0000 | INHALATION_SPRAY | Freq: Four times a day (QID) | RESPIRATORY_TRACT | Status: DC | PRN

## 2020-03-27 MED ORDER — METHYLPREDNISOLONE SODIUM SUCC 125 MG IJ SOLR
60.0000 mg | Freq: Two times a day (BID) | INTRAMUSCULAR | Status: DC
  Administered 2020-03-27 – 2020-03-29 (×4): 60 mg via INTRAVENOUS
  Filled 2020-03-27 (×4): qty 2

## 2020-03-27 MED ORDER — CARVEDILOL 12.5 MG PO TABS: 12.5000 mg | ORAL_TABLET | Freq: Two times a day (BID) | ORAL | Status: DC

## 2020-03-27 MED ORDER — ASPIRIN EC 81 MG PO TBEC
162.0000 mg | DELAYED_RELEASE_TABLET | Freq: Every day | ORAL | Status: DC
  Administered 2020-03-28 – 2020-03-30 (×3): 162 mg via ORAL
  Filled 2020-03-27 (×3): qty 2

## 2020-03-27 MED ORDER — IPRATROPIUM BROMIDE 0.02 % IN SOLN
RESPIRATORY_TRACT | Status: AC
  Administered 2020-03-27: 0.5 mg
  Filled 2020-03-27: qty 2.5

## 2020-03-27 MED ORDER — FUROSEMIDE 10 MG/ML IJ SOLN
INTRAMUSCULAR | Status: AC
  Filled 2020-03-27: qty 8

## 2020-03-27 MED ORDER — AMLODIPINE BESYLATE 5 MG PO TABS: 10.0000 mg | ORAL_TABLET | Freq: Every day | ORAL | Status: DC

## 2020-03-27 MED ORDER — ACETAMINOPHEN 650 MG RE SUPP: 650.0000 mg | Freq: Four times a day (QID) | RECTAL | Status: DC | PRN

## 2020-03-27 MED ORDER — IPRATROPIUM-ALBUTEROL 0.5-2.5 (3) MG/3ML IN SOLN: 3.0000 mL | Freq: Four times a day (QID) | RESPIRATORY_TRACT | Status: DC | PRN

## 2020-03-27 MED ORDER — IRBESARTAN 150 MG PO TABS: 150.0000 mg | ORAL_TABLET | Freq: Every day | ORAL | Status: DC

## 2020-03-27 MED ORDER — SODIUM CHLORIDE 0.9 % IV SOLN
500.0000 mg | INTRAVENOUS | Status: AC
  Administered 2020-03-27: 500 mg via INTRAVENOUS
  Filled 2020-03-27: qty 500

## 2020-03-27 MED ORDER — ATORVASTATIN CALCIUM 40 MG PO TABS
80.0000 mg | ORAL_TABLET | Freq: Every day | ORAL | Status: DC
  Administered 2020-03-29 – 2020-03-30 (×2): 80 mg via ORAL
  Filled 2020-03-27 (×3): qty 2

## 2020-03-27 MED ORDER — FUROSEMIDE 10 MG/ML IJ SOLN
80.0000 mg | Freq: Once | INTRAMUSCULAR | Status: AC
  Administered 2020-03-27: 80 mg via INTRAVENOUS

## 2020-03-27 MED ORDER — POLYETHYLENE GLYCOL 3350 17 G PO PACK: 17.0000 g | PACK | Freq: Every day | ORAL | Status: DC | PRN

## 2020-03-27 MED ORDER — ALBUMIN HUMAN 25 % IV SOLN
12.5000 g | Freq: Once | INTRAVENOUS | Status: AC
  Administered 2020-03-28: 12.5 g via INTRAVENOUS
  Filled 2020-03-27 (×2): qty 50

## 2020-03-27 MED ORDER — TRAZODONE HCL 50 MG PO TABS: 25.0000 mg | ORAL_TABLET | Freq: Every evening | ORAL | Status: DC | PRN

## 2020-03-27 MED ORDER — PANTOPRAZOLE SODIUM 40 MG PO TBEC
40.0000 mg | DELAYED_RELEASE_TABLET | Freq: Every day | ORAL | Status: DC
  Administered 2020-03-28 – 2020-03-30 (×3): 40 mg via ORAL
  Filled 2020-03-27 (×3): qty 1

## 2020-03-27 MED ORDER — ALBUTEROL SULFATE (2.5 MG/3ML) 0.083% IN NEBU
INHALATION_SOLUTION | RESPIRATORY_TRACT | Status: AC
  Administered 2020-03-27: 5 mg
  Filled 2020-03-27: qty 6

## 2020-03-27 MED ORDER — FERROUS SULFATE 325 (65 FE) MG PO TABS
325.0000 mg | ORAL_TABLET | Freq: Two times a day (BID) | ORAL | Status: DC
  Administered 2020-03-28 – 2020-03-30 (×5): 325 mg via ORAL
  Filled 2020-03-27 (×5): qty 1

## 2020-03-27 MED ORDER — AZITHROMYCIN 250 MG PO TABS
500.0000 mg | ORAL_TABLET | Freq: Every day | ORAL | Status: DC
  Administered 2020-03-28 – 2020-03-29 (×2): 500 mg via ORAL
  Filled 2020-03-27 (×2): qty 2

## 2020-03-27 MED ORDER — SODIUM CHLORIDE 0.9 % IV BOLUS
250.0000 mL | Freq: Once | INTRAVENOUS | Status: AC
  Administered 2020-03-28: 250 mL via INTRAVENOUS

## 2020-03-27 MED ORDER — ACETAMINOPHEN 325 MG PO TABS: 650.0000 mg | ORAL_TABLET | Freq: Four times a day (QID) | ORAL | Status: DC | PRN

## 2020-03-27 MED ORDER — OXYCODONE HCL 5 MG PO TABS: 5.0000 mg | ORAL_TABLET | ORAL | Status: DC | PRN

## 2020-03-27 MED ORDER — METHYLPREDNISOLONE SODIUM SUCC 125 MG IJ SOLR
125.0000 mg | Freq: Once | INTRAMUSCULAR | Status: AC
  Administered 2020-03-27: 125 mg via INTRAVENOUS
  Filled 2020-03-27: qty 2

## 2020-03-27 MED ORDER — CLOPIDOGREL BISULFATE 75 MG PO TABS
75.0000 mg | ORAL_TABLET | Freq: Every day | ORAL | Status: DC
  Administered 2020-03-28 – 2020-03-30 (×3): 75 mg via ORAL
  Filled 2020-03-27 (×3): qty 1

## 2020-03-27 MED ORDER — IPRATROPIUM-ALBUTEROL 0.5-2.5 (3) MG/3ML IN SOLN
3.0000 mL | Freq: Once | RESPIRATORY_TRACT | Status: AC
  Administered 2020-03-27: 3 mL via RESPIRATORY_TRACT
  Filled 2020-03-27: qty 3

## 2020-03-27 MED ORDER — NICOTINE 14 MG/24HR TD PT24
14.0000 mg | MEDICATED_PATCH | Freq: Every day | TRANSDERMAL | Status: DC
  Administered 2020-03-28 – 2020-03-30 (×3): 14 mg via TRANSDERMAL
  Filled 2020-03-27 (×3): qty 1

## 2020-03-27 MED ORDER — DOCUSATE SODIUM 100 MG PO CAPS
100.0000 mg | ORAL_CAPSULE | Freq: Two times a day (BID) | ORAL | Status: DC
  Administered 2020-03-27 – 2020-03-30 (×6): 100 mg via ORAL
  Filled 2020-03-27 (×6): qty 1

## 2020-03-27 NOTE — ED Notes (Signed)
Date and time results received: 03/27/20 11:47 PM  Test: pCO2 Critical Value: 80.4  Name of Provider Notified: [place] Zierle-Ghosh, DO  Orders Received? Or Actions Taken?: Actions Taken: n/a

## 2020-03-27 NOTE — ED Notes (Signed)
Call from lab   Critical PCO2 72.3  Dr T informed

## 2020-03-27 NOTE — ED Notes (Signed)
PT WAS BLADDER SCANNED   74 ML OF URINE IN BLADDER AT THIS TIME  (PT HAS HAD LASIX 80 MG(

## 2020-03-27 NOTE — H&P (Addendum)
TRH H&P    Patient Demographics:    Erica [name], is a 69 y.o. female  MRN: [id number]  DOB - 06/24/1951  Admit Date - 03/27/2020  Referring MD/NP/PA: Dr. Langston Masker  Outpatient Primary MD for the patient is Iona Beard, MD  Patient coming from: Home  Chief complaint- dyspnea   HPI:    Erica [name]  is a 69 y.o. female, with history of stroke, HTN, HLD, DMII, COPD, CHF and more presents to the ED with dyspnea. Patient reports onset was yesterday morning. Patient reports that it has been progressively worse. The dyspnea is worse on exertion, and better with rest. She reports orthopnea at home and palpitations. She has no chest pain, diaphoresis, or nausea. She has no history of MI. She is a current smoker. She has associated swelling. Patient reports cough that is productive of thick yellow sputum. She has not had any fevers or body aches. No ill contacts. Patient reports decrease in urine output. Her last void was the AM of 03/27/20. It was non painful, non bloody, and there was not malodorous. Patient reports that she has been constipated. She has tried to take linzess, without relief.   ED highlights A febrile, no leukocytosis PCO2 increase from 61 to 72 Cr elevated at 2.51, baseline 2.3 BNP 783 Trop 52 and then 48 covid negative    Review of systems:    In addition to the HPI above,  No Fever-chills, No Headache, No changes with Vision or hearing, No problems swallowing food or Liquids, No Chest pain, Positive for Cough and Shortness of Breath, No Abdominal pain, No Nausea or Vomiting, Positive for constipation No Blood in stool or Urine, No dysuria, decreased urine output No new skin rashes or bruises, No new joints pains-aches,  No new weakness, tingling, numbness in any extremity, No recent weight gain or loss, No polyuria, polydypsia or polyphagia, No significant Mental Stressors.  All  other systems reviewed and are negative.    Past History of the following :    Past Medical History:  Diagnosis Date  . Arthritis   . CHF (congestive heart failure) ([place])   . Chronic back pain   . COPD (chronic obstructive pulmonary disease) (HCC)    2L home O2  . Diabetes mellitus without complication ([place])   . Hyperlipidemia   . Hypertension   . Stroke (cerebrum) [facility])       Past Surgical History:  Procedure Laterality Date  . ABDOMINAL HYSTERECTOMY    . colonoscopy  2009   Dr. Gala Romney: cluster of diminutive rectal polyps and 2 diminutive polyps in rectosigmoid junction, hyperplastic  . COLONOSCOPY WITH PROPOFOL N/A 10/18/2017   Procedure: COLONOSCOPY WITH PROPOFOL;  Surgeon: Daneil Dolin, MD;  [facility];  Service: Endoscopy;  Laterality: N/A;  9:30am  . ESOPHAGOGASTRODUODENOSCOPY N/A 01/25/2018   Procedure: ESOPHAGOGASTRODUODENOSCOPY (EGD);  Surgeon: Danie Binder, MD;  Location: [facility];  Service: Endoscopy;  Laterality: N/A;  . ESOPHAGOGASTRODUODENOSCOPY (EGD) WITH PROPOFOL N/A 08/29/2017   Dr. Gala Romney with propofol: small hiatal hernia.  4cm fundal diverticulum.   Marland Kitchen GIVENS CAPSULE STUDY N/A 01/25/2018   Procedure: GIVENS CAPSULE STUDY;  Surgeon: Danie Binder, MD;  Location: [facility];  Service: Endoscopy;  Laterality: N/A;  . left hand middle finger     in an accident at work, amputated      Social History:      Social History   Tobacco Use  . Smoking status: Current Some Day Smoker    Packs/day: 0.25    Years: 52.00    Pack years: 13.00    Types: Cigarettes    Last attempt to quit: 09/23/2017    Years since quitting: 2.5  . Smokeless tobacco: Never Used  Substance Use Topics  . Alcohol use: No       Family History :     Family History  Problem Relation Age of Onset  . CVA Mother 28  . Cancer Neg Hx   . Colon cancer Neg Hx   . Breast cancer Neg Hx       Home Medications:   Prior to Admission medications   Medication  Sig Start Date End Date Taking? Authorizing Provider  albuterol (VENTOLIN HFA) 108 (90 Base) MCG/ACT inhaler Inhale 1 puff into the lungs every 6 (six) hours as needed for shortness of breath. 03/25/20  Yes [provider]  amLODipine (NORVASC) 10 MG tablet Take 10 mg by mouth daily.  10/31/19  Yes [provider]  aspirin EC 81 MG tablet Take 162 mg by mouth daily.   Yes [provider]  atorvastatin (LIPITOR) 80 MG tablet Take 80 mg by mouth daily.  12/04/16  Yes [provider]  carvedilol (COREG) 12.5 MG tablet Take 12.5 mg by mouth 2 (two) times daily.  10/31/19  Yes [provider]  cefdinir (OMNICEF) 300 MG capsule Take 1 capsule by mouth at bedtime. 03/25/20  Yes [provider]  clopidogrel (PLAVIX) 75 MG tablet Take 75 mg by mouth daily.  03/29/18  Yes [provider]  doxycycline (VIBRAMYCIN) 100 MG capsule Take 100 mg by mouth 2 (two) times daily. 03/25/20  Yes [provider]  famotidine (PEPCID) 20 MG tablet Take 20 mg by mouth daily.  03/29/18  Yes [provider]  ferrous sulfate 325 (65 FE) MG EC tablet Take 1 tablet (325 mg total) by mouth 2 (two) times daily. 01/25/18 03/27/20 Yes Erline Hau, MD  ipratropium-albuterol (DUONEB) 0.5-2.5 (3) MG/3ML SOLN Take 3 mLs by nebulization every 6 (six) hours as needed (for shortness of breath).   Yes [provider]  Linaclotide (LINZESS) 145 MCG CAPS capsule Take 145 mcg by mouth daily as needed (for constipation).    Yes [provider]  omega-3 acid ethyl esters (LOVAZA) 1 g capsule Take 1 g by mouth daily.  12/07/16  Yes [provider]  pantoprazole (PROTONIX) 40 MG tablet Take 1 tablet (40 mg total) by mouth daily. 01/25/18  Yes Isaac Bliss, Rayford Halsted, MD  SPIRIVA HANDIHALER 18 MCG inhalation capsule Take 1 puff by mouth daily. 10/31/17  Yes [provider]  SYMBICORT 80-4.5 MCG/ACT inhaler Inhale 2 puffs into the lungs 2  (two) times daily. 03/15/20  Yes [provider]  torsemide (DEMADEX) 20 MG tablet Take 1 tablet (20 mg total) by mouth daily. Take 1 tablet on Monday, Wednesday, and Friday only. 02/26/19  Yes Arnoldo Lenis, MD  valsartan (DIOVAN) 160 MG tablet Take 160 mg by mouth daily. 03/15/20  Yes [provider]     Allergies:    No Known Allergies   Physical Exam:   Vitals  Blood pressure (!) 87/40, pulse 62, temperature 98.9 F (37.2 C), temperature source Oral, resp. rate (!) 31, height 5\' 4"  (1.626 m), weight 63 kg, SpO2 94 %.  1.  General:  Lying supine in bed on NRB, awaiting to go back onto BiPAP  2. Psychiatric: Mood and behavior are normal  3. Neurologic: No focal deficits on limited exam  4. HEENMT:  Head is AT, [place]ck is supple Trachea is midline  5. Respiratory : Wheeze and crackles on exam R>L  6. Cardiovascular : HR Normal, rhythm regular  7. Gastrointestinal:  No abdominal tenderness to palpation, soft, non-distended Midline vertical scar below umbilicus  8. Skin:  No acute lesions on limited skin exam  9.Musculoskeletal:  2+ peripheral edema    Data Review:    CBC Recent Labs  Lab 03/27/20 1920  WBC 5.4  HGB 12.4  HCT 41.5  PLT 144*  MCV 89.8  MCH 26.8  MCHC 29.9*  RDW 15.1  LYMPHSABS 0.8  MONOABS 0.5  EOSABS 0.1  BASOSABS 0.0   ------------------------------------------------------------------------------------------------------------------  Results for orders placed or performed during the hospital encounter of 03/27/20 (from the past 48 hour(s))  Brain natriuretic peptide     Status: Abnormal   Collection Time: 03/27/20  7:20 PM  Result Value Ref Range   B Natriuretic Peptide 783.0 (H) 0.0 - 100.0 pg/mL    Comment: Performed at [facility], [address] [place], [place] [address]  Troponin I (High Sensitivity)     Status: Abnormal   Collection Time: 03/27/20  7:20 PM  Result Value Ref Range   Troponin I (High  Sensitivity) 52 (H) <18 ng/L    Comment: (NOTE) Elevated high sensitivity troponin I (hsTnI) values and significant  changes across serial measurements may suggest ACS but many other  chronic and acute conditions are known to elevate hsTnI results.  Refer to the "Links" section for chest pain algorithms and additional  guidance. Performed at [facility], [address] [place], [place] [address]   Basic metabolic panel     Status: Abnormal   Collection Time: 03/27/20  7:20 PM  Result Value Ref Range   Sodium 142 135 - 145 mmol/L   Potassium 3.9 3.5 - 5.1 mmol/L   Chloride 98 98 - 111 mmol/L   CO2 32 22 - 32 mmol/L   Glucose, Bld 100 (H) 70 - 99 mg/dL    Comment: Glucose reference range applies only to samples taken after fasting for at least 8 hours.   BUN 45 (H) 8 - 23 mg/dL   Creatinine, Ser 2.51 (H) 0.44 - 1.00 mg/dL   Calcium 7.9 (L) 8.9 - 10.3 mg/dL   GFR calc non Af Amer 19 (L) >60 mL/min   GFR calc Af Amer 22 (L) >60 mL/min   Anion gap 12 5 - 15    Comment: Performed at [facility], [address] [place], [place] [address]  CBC with Differential     Status: Abnormal   Collection Time: 03/27/20  7:20 PM  Result Value Ref Range   WBC 5.4 4.0 - 10.5 K/uL   RBC 4.62 3.87 - 5.11 MIL/uL   Hemoglobin 12.4 12.0 - 15.0 g/dL   HCT 41.5 36 - 46 %   MCV 89.8 80.0 - 100.0 fL   MCH 26.8 26.0 - 34.0 pg   MCHC 29.9 (L) 30.0 -  36.0 g/dL   RDW 15.1 11.5 - 15.5 %   Platelets 144 (L) 150 - 400 K/uL   nRBC 0.0 0.0 - 0.2 %   Neutrophils Relative % 76 %   Neutro Abs 4.1 1.7 - 7.7 K/uL   Lymphocytes Relative 14 %   Lymphs Abs 0.8 0.7 - 4.0 K/uL   Monocytes Relative 9 %   Monocytes Absolute 0.5 0 - 1 K/uL   Eosinophils Relative 1 %   Eosinophils Absolute 0.1 0 - 0 K/uL   Basophils Relative 0 %   Basophils Absolute 0.0 0 - 0 K/uL   Immature Granulocytes 0 %   Abs Immature Granulocytes 0.01 0.00 - 0.07 K/uL    Comment: Performed at [facility], [address] [place], [place]  [address]  Blood gas, venous (at [facility] and [facility], not at [facility])     Status: Abnormal   Collection Time: 03/27/20  7:49 PM  Result Value Ref Range   FIO2 21.00    pH, Ven 7.372 7.25 - 7.43   pCO2, Ven 61.6 (H) 44 - 60 mmHg   pO2, Ven 35.8 32 - 45 mmHg   Bicarbonate 31.0 (H) 20.0 - 28.0 mmol/L   Acid-Base Excess 9.5 (H) 0.0 - 2.0 mmol/L   O2 Saturation 61.9 %   Patient temperature 37.0    Collection site VENOUS    Drawn by 442     Comment: Performed at [facility], [address] [place], [place] [address]  Blood gas, venous (at [facility] and [facility], not at [facility])     Status: Abnormal   Collection Time: 03/27/20  9:30 PM  Result Value Ref Range   FIO2 80.00    pH, Ven 7.318 7.25 - 7.43   pCO2, Ven 72.3 (HH) 44 - 60 mmHg    Comment: CRITICAL RESULT CALLED TO, READ BACK BY AND VERIFIED WITH: SITES,K @ 2152 ON 03/27/20 BY JUW    pO2, Ven 41.5 32 - 45 mmHg   Bicarbonate 31.0 (H) 20.0 - 28.0 mmol/L   Acid-Base Excess 9.8 (H) 0.0 - 2.0 mmol/L   O2 Saturation 65.1 %   Patient temperature 37.0     Comment: Performed at [facility], [address], [place], [place] [address]  Troponin I (High Sensitivity)     Status: Abnormal   Collection Time: 03/27/20  9:31 PM  Result Value Ref Range   Troponin I (High Sensitivity) 48 (H) <18 ng/L    Comment: (NOTE) Elevated high sensitivity troponin I (hsTnI) values and significant  changes across serial measurements may suggest ACS but many other  chronic and acute conditions are known to elevate hsTnI results.  Refer to the "Links" section for chest pain algorithms and additional  guidance. Performed at [facility], [address], [place], [place] [address]     Chemistries  Recent Labs  Lab 03/27/20 1920  NA 142  K 3.9  CL 98  CO2 32  GLUCOSE 100*  BUN 45*  CREATININE 2.51*  CALCIUM 7.9*    ------------------------------------------------------------------------------------------------------------------  ------------------------------------------------------------------------------------------------------------------ GFR: Estimated Creatinine Clearance: 18 mL/min (A) (by C-G formula based on SCr of 2.51 mg/dL (H)). Liver Function Tests: No results for input(s): AST, ALT, ALKPHOS, BILITOT, PROT, ALBUMIN in the last 168 hours. No results for input(s): LIPASE, AMYLASE in the last 168 hours. No results for input(s): AMMONIA in the last 168 hours. Coagulation Profile: No results for input(s): INR, PROTIME in the last 168 hours. Cardiac Enzymes: No results for input(s): CKTOTAL, CKMB, CKMBINDEX,  TROPONINI in the last 168 hours. BNP (last 3 results) No results for input(s): PROBNP in the last 8760 hours. HbA1C: No results for input(s): HGBA1C in the last 72 hours. CBG: No results for input(s): GLUCAP in the last 168 hours. Lipid Profile: No results for input(s): CHOL, HDL, LDLCALC, TRIG, CHOLHDL, LDLDIRECT in the last 72 hours. Thyroid Function Tests: No results for input(s): TSH, T4TOTAL, FREET4, T3FREE, THYROIDAB in the last 72 hours. Anemia Panel: No results for input(s): VITAMINB12, FOLATE, FERRITIN, TIBC, IRON, RETICCTPCT in the last 72 hours.  --------------------------------------------------------------------------------------------------------------- Urine analysis:    Component Value Date/Time   COLORURINE YELLOW 05/21/2019 1136   APPEARANCEUR CLEAR 05/21/2019 1136   LABSPEC 1.011 05/21/2019 1136   PHURINE 5.0 05/21/2019 1136   GLUCOSEU NEGATIVE 05/21/2019 1136   HGBUR NEGATIVE 05/21/2019 1136   [place] 05/21/2019 1136   KETONESUR NEGATIVE 05/21/2019 1136   PROTEINUR NEGATIVE 05/21/2019 1136   UROBILINOGEN 0.2 03/14/2013 1847   NITRITE NEGATIVE 05/21/2019 1136   LEUKOCYTESUR NEGATIVE 05/21/2019 1136      Imaging Results:    DG Chest  Portable 1 View  Result Date: 03/27/2020 CLINICAL DATA:  Shortness of breath, suspected COPD exacerbation EXAM: PORTABLE CHEST 1 VIEW COMPARISON:  02/18/2020 FINDINGS: Cardiomediastinal contours are enlarged. Blunting of RIGHT and LEFT costodiaphragmatic sulcus. Perhaps slightly increased when compared to the prior study. Increased interstitial markings similar to the prior exam. No lobar consolidation. Visualized skeletal structures on limited assessment without acute process. IMPRESSION: Slight increase in bilateral effusions may indicate heart failure in this patient with marked cardiomegaly. Background interstitial changes are similar and there is chronic interstitial thickening. Superimposed edema is possible though there is no change in this appearance from the previous radiograph. Electronically Signed   By: Zetta Bills M.D.   On: 03/27/2020 20:10    My personal review of EKG: Rhythm NSR, Rate 64/min, QTc 436 ,no Acute ST changes - ST depression and T wave inversion that is present in V3,V4 was sited on 526 - unchanged.   Assessment & Plan:    Active Problems:   Acute respiratory failure with hypoxia (HCC)   1. Acute respiratory failure with hypoxia and hypercapnia 1. 4LPM [place] at baseline 2. Saturating at 60-70% at arrival 3. Patient placed on NRB without adequate correction, and then placed on BiPAP 4. Patient saturating in 90s on BiPAP 5. Likely 2/2 combo of CHF and COPD exacerbation 6. PCO2 = 72 7. Recheck VBG at midnight, and then in the AM 8. Respiratory eval and treat 2. Diastolic CHF exacerbation 1. Last Echo 08/2018 with EF 65% 2. On torsemide, coreg, lipitor, and aspirin at home 3. Continue asa, statin, coreg, start lasix 4. CXR shows - slight increase in BL effusions may indicate heart failure in this patient with marked cardiomegaly 5. BNP 783 6. Crackles on exam 7. Echo in the AM 8. Daily weights 9. Strict Is and Os 3. Acute COPD exacerbation 1. PCO2  72 2. Wheezing on exam 3. Start solumedrol and zithromycin 4. Continue albutrol and duoneb treatments 5. Continue BiPAP 6. RT consult 7. Continue to monitor 4. Hypotension 1. BP as low as MAP 62 2. Possibly intravascularly dry 3. Albumin and small IV bolus.  5. Tobacco use disorder 1. Continues to smoke 0.5-1ppd  2. Nicotine patch 6. Elevated trop.  1. Demand ischemia 2. Down trending from 52 to 48 3. Monitor on tele 7. Decrease in urine output 1. Last void was small volume more than 12 hours ago 2.  Bladder scan pending 3. Possible urinary retention 2/2 constipation 4. Possibly intravascularly dry    DVT Prophylaxis-   Heparin - SCDs   AM Labs Ordered, also please review Full Orders  Family Communication: No family at bedside Code Status:  Full  Admission status: Inpatient :The appropriate admission status for this patient is INPATIENT. Inpatient status is judged to be reasonable and necessary in order to provide the required intensity of service to ensure the patient's safety. The patient's presenting symptoms, physical exam findings, and initial radiographic and laboratory data in the context of their chronic comorbidities is felt to place them at high risk for further clinical deterioration. Furthermore, it is not anticipated that the patient will be medically stable for discharge from the hospital within 2 midnights of admission. The following factors support the admission status of inpatient.     The patient's presenting symptoms include dyspnea on exertion The worrisome physical exam findings include O2 sats in 60s The initial radiographic and laboratory data are worrisome because of pH 7.32, CO2 72 The chronic co-morbidities include stroke, HTN, HLD, DMII, COPD, and CHF       * I certify that at the point of admission it is my clinical judgment that the patient will require inpatient hospital care spanning beyond 2 midnights from the point of admission due to high  intensity of service, high risk for further deterioration and high frequency of surveillance required.*  Time spent in minutes : [place]

## 2020-03-27 NOTE — ED Notes (Signed)
Pt reports her work of breathing is easier  Her HOB is more reclined to 45 degrees   She reports her legs are cold and she is given a warm blanket

## 2020-03-27 NOTE — ED Notes (Signed)
Pt reports increasing shortness of breath since yesterday   Per family member, pt was encouraged to come yesterday for admission  She is orthopneic, tachypnic and  Gasping with Osats moving from 25s to 106 on 4 L  She was then started on non rebreather until Bi-pap could be started  Robert, resp here and pt on bi-pap with O2 sats climbing to 97

## 2020-03-27 NOTE — ED Triage Notes (Addendum)
Pt c/o SOB and bilateral lower extremity swelling since yesterday. Pt normally wears 4LPM via [place] O2 55% on 4Lpm, pt placed on 15LPM NRB, O2 slowly rising. EDP at bedside

## 2020-03-27 NOTE — ED Notes (Signed)
Placed on NRB mask 15 lpm

## 2020-03-27 NOTE — ED Provider Notes (Signed)
[facility] Provider Note   CSN: [id number] Arrival date & time: 03/27/20  1925     History Chief Complaint  Patient presents with  . Shortness of Breath    Erica [name] is a 69 y.o. female with a history of congestive heart failure, COPD on 4 L home O2, diabetes, hyperlipidemia, hypertension, presented emergency room shortness of breath.  Patient reports that she has had increased work of breathing and shortness of breath for the past several days.  It is not clear exactly when this started.  She does feel like she is keeping fluid in her body and her legs are swelling.  She reports worsening orthopnea.  Her sister at bedside reports he went to see her PCP 2 days ago, and her PCP had encouraged her to come to the hospital for admission at that point, the patient did not want to.  Today she was so short of breath she could not even get out of the car.  Patient denies active chest pain or pressure.  She has been compliant with her home medications.  She is prescribed torsemide 20 mg three days per week.  She has not missed doses.  She has a hx of CKD but is not on dialysis.  She is urinating normally at home.  Last echo 8185 with diastolic CHF LVEF 63%  She states she does not have a cardiologist.  She smokes about 1 ppd daily.  HPI     Past Medical History:  Diagnosis Date  . Arthritis   . CHF (congestive heart failure) ([place])   . Chronic back pain   . COPD (chronic obstructive pulmonary disease) (HCC)    2L home O2  . Diabetes mellitus without complication ([place])   . Hyperlipidemia   . Hypertension   . Stroke (cerebrum) [facility])     Patient Active Problem List   Diagnosis Date Noted  . Acute respiratory failure with hypoxia ([place]) 03/27/2020  . Anemia in stage 4 chronic kidney disease ([place]) 01/07/2020  . Abnormal mammogram 01/07/2020  . Anemia due to stage 4 chronic kidney disease ([place]) 12/31/2019  . Health care maintenance 12/31/2019  . Weight  loss 12/31/2019  . Acute hyperkalemia 05/21/2019  . Acute hypoxemic respiratory failure ([place]) 09/03/2018  . Acute diastolic (congestive) heart failure ([place]) 03/09/2018  . AVM (arteriovenous malformation) of duodenum, acquired   . GIB (gastrointestinal bleeding) 01/23/2018  . CKD (chronic kidney disease) 01/23/2018  . Hyperkalemia 12/21/2017  . Hyperlipidemia 12/18/2017  . Hypomagnesemia 12/17/2017  . Acute renal failure superimposed on stage 3 chronic kidney disease ([place]) 12/07/2017  . Acute on chronic respiratory failure with hypoxia ([place]) 12/07/2017  . COPD with acute exacerbation ([place]) 12/07/2017  . Acute diastolic CHF (congestive heart failure) ([place]) 09/19/2017  . Iron deficiency anemia due to chronic blood loss   . Acute diverticulitis 08/29/2017  . Heme positive stool   . Symptomatic anemia   . GI bleed 08/27/2017  . Elevated troponin 08/27/2017  . (HFpEF) heart failure with preserved ejection fraction ([place]) 08/27/2017  . Acute dyspnea   . Acute on chronic respiratory failure ([place]) 02/11/2017  . Acute on chronic diastolic CHF (congestive heart failure) ([place]) 02/11/2017  . Benign essential HTN 11/20/2015  . GERD (gastroesophageal reflux disease) 11/20/2015  . Tobacco abuse 11/20/2015  . Type 2 diabetes mellitus with hyperglycemia ([place]) 11/20/2015  . COPD exacerbation ([place]) 09/05/2015  . Chronic pain syndrome 09/05/2015  . Hypertension 09/05/2015  . Diabetes mellitus type 2, controlled (  [place]) 09/05/2015  . Chronic respiratory failure ([place]) 09/05/2015  . OLECRANON BURSITIS, LEFT 08/04/2010    Past Surgical History:  Procedure Laterality Date  . ABDOMINAL HYSTERECTOMY    . colonoscopy  2009   Dr. Gala Romney: cluster of diminutive rectal polyps and 2 diminutive polyps in rectosigmoid junction, hyperplastic  . COLONOSCOPY WITH PROPOFOL N/A 10/18/2017   Procedure: COLONOSCOPY WITH PROPOFOL;  Surgeon: Daneil Dolin, MD;  Location[facility];  Service: Endoscopy;  Laterality:  N/A;  9:30am  . ESOPHAGOGASTRODUODENOSCOPY N/A 01/25/2018   Procedure: ESOPHAGOGASTRODUODENOSCOPY (EGD);  Surgeon: Danie Binder, MD;  Location: [facility];  Service: Endoscopy;  Laterality: N/A;  . ESOPHAGOGASTRODUODENOSCOPY (EGD) WITH PROPOFOL N/A 08/29/2017   Dr. Gala Romney with propofol: small hiatal hernia. 4cm fundal diverticulum.   Marland Kitchen GIVENS CAPSULE STUDY N/A 01/25/2018   Procedure: GIVENS CAPSULE STUDY;  Surgeon: Danie Binder, MD;  Location: [facility];  Service: Endoscopy;  Laterality: N/A;  . left hand middle finger     in an accident at work, amputated     OB History    Gravida  0   Para  0   Term  0   Preterm  0   AB  0   Living        SAB  0   TAB  0   Ectopic  0   Multiple      Live Births              Family History  Problem Relation Age of Onset  . CVA Mother 93  . Cancer Neg Hx   . Colon cancer Neg Hx   . Breast cancer Neg Hx     Social History   Tobacco Use  . Smoking status: Current Some Day Smoker    Packs/day: 0.25    Years: 52.00    Pack years: 13.00    Types: Cigarettes    Last attempt to quit: 09/23/2017    Years since quitting: 2.5  . Smokeless tobacco: Never Used  Vaping Use  . Vaping Use: Never used  Substance Use Topics  . Alcohol use: No  . Drug use: No    Home Medications Prior to Admission medications   Medication Sig Start Date End Date Taking? Authorizing Provider  albuterol (VENTOLIN HFA) 108 (90 Base) MCG/ACT inhaler Inhale 1 puff into the lungs every 6 (six) hours as needed for shortness of breath. 03/25/20  Yes [provider]  amLODipine (NORVASC) 10 MG tablet Take 10 mg by mouth daily.  10/31/19  Yes [provider]  aspirin EC 81 MG tablet Take 162 mg by mouth daily.   Yes [provider]  atorvastatin (LIPITOR) 80 MG tablet Take 80 mg by mouth daily.  12/04/16  Yes [provider]  carvedilol (COREG) 12.5 MG tablet Take 12.5 mg by mouth 2 (two) times daily.  10/31/19  Yes  [provider]  cefdinir (OMNICEF) 300 MG capsule Take 1 capsule by mouth at bedtime. 03/25/20  Yes [provider]  clopidogrel (PLAVIX) 75 MG tablet Take 75 mg by mouth daily.  03/29/18  Yes [provider]  doxycycline (VIBRAMYCIN) 100 MG capsule Take 100 mg by mouth 2 (two) times daily. 03/25/20  Yes [provider]  famotidine (PEPCID) 20 MG tablet Take 20 mg by mouth daily.  03/29/18  Yes [provider]  ferrous sulfate 325 (65 FE) MG EC tablet Take 1 tablet (325 mg total) by mouth 2 (two)  times daily. 01/25/18 03/27/20 Yes Erline Hau, MD  ipratropium-albuterol (DUONEB) 0.5-2.5 (3) MG/3ML SOLN Take 3 mLs by nebulization every 6 (six) hours as needed (for shortness of breath).   Yes [provider]  Linaclotide (LINZESS) 145 MCG CAPS capsule Take 145 mcg by mouth daily as needed (for constipation).    Yes [provider]  omega-3 acid ethyl esters (LOVAZA) 1 g capsule Take 1 g by mouth daily.  12/07/16  Yes [provider]  pantoprazole (PROTONIX) 40 MG tablet Take 1 tablet (40 mg total) by mouth daily. 01/25/18  Yes Isaac Bliss, Rayford Halsted, MD  SPIRIVA HANDIHALER 18 MCG inhalation capsule Take 1 puff by mouth daily. 10/31/17  Yes [provider]  SYMBICORT 80-4.5 MCG/ACT inhaler Inhale 2 puffs into the lungs 2 (two) times daily. 03/15/20  Yes [provider]  torsemide (DEMADEX) 20 MG tablet Take 1 tablet (20 mg total) by mouth daily. Take 1 tablet on Monday, Wednesday, and Friday only. 02/26/19  Yes Arnoldo Lenis, MD  valsartan (DIOVAN) 160 MG tablet Take 160 mg by mouth daily. 03/15/20  Yes [provider]    Allergies    Patient has no known allergies.  Review of Systems   Review of Systems  Constitutional: Negative for chills and fever.  HENT: Negative for ear pain and sore throat.   Eyes: Negative for pain and visual disturbance.  Respiratory: Positive for cough and shortness of  breath.   Cardiovascular: Positive for leg swelling. Negative for chest pain and palpitations.  Gastrointestinal: Negative for abdominal pain and vomiting.  Genitourinary: Negative for dysuria and hematuria.  Musculoskeletal: Negative for arthralgias and back pain.  Skin: Negative for color change and rash.  Neurological: Negative for syncope and headaches.  Psychiatric/Behavioral: Negative for agitation and confusion.  All other systems reviewed and are negative.   Physical Exam Updated Vital Signs BP (!) 87/40   Pulse 62   Temp 98.9 F (37.2 C) (Oral)   Resp (!) 31   Ht 5\' 4"  (1.626 m)   Wt 63 kg   SpO2 94%   BMI 23.86 kg/m   Physical Exam Vitals and nursing note reviewed.  Constitutional:      General: She is not in acute distress.    Appearance: She is well-developed.  HENT:     Head: Normocephalic and atraumatic.  Eyes:     Conjunctiva/sclera: Conjunctivae normal.  Cardiovascular:     Rate and Rhythm: Regular rhythm. Tachycardia present.     Pulses: Normal pulses.  Pulmonary:     Comments: Speaking in full sentences, no retractions 60% on [place], improved to 95% on nonbreather mask Rales bilaterally, distant wheezing Abdominal:     Palpations: Abdomen is soft.     Tenderness: There is no abdominal tenderness.  Musculoskeletal:     Cervical back: Neck supple.     Right lower leg: Edema present.     Left lower leg: Edema present.  Skin:    General: Skin is warm and dry.  Neurological:     General: No focal deficit present.     Mental Status: She is alert and oriented to person, place, and time.  Psychiatric:        Mood and Affect: Mood normal.        Behavior: Behavior normal.     ED Results / Procedures / Treatments   Labs (all labs ordered are listed, but only abnormal results are displayed) Labs Reviewed  BRAIN NATRIURETIC PEPTIDE -  Abnormal; Notable for the following components:      Result Value   B Natriuretic Peptide 783.0 (*)    All other  components within normal limits  BASIC METABOLIC PANEL - Abnormal; Notable for the following components:   Glucose, Bld 100 (*)    BUN 45 (*)    Creatinine, Ser 2.51 (*)    Calcium 7.9 (*)    GFR calc non Af Amer 19 (*)    GFR calc Af Amer 22 (*)    All other components within normal limits  CBC WITH DIFFERENTIAL/PLATELET - Abnormal; Notable for the following components:   MCHC 29.9 (*)    Platelets 144 (*)    All other components within normal limits  BLOOD GAS, VENOUS - Abnormal; Notable for the following components:   pCO2, Ven 61.6 (*)    Bicarbonate 31.0 (*)    Acid-Base Excess 9.5 (*)    All other components within normal limits  BLOOD GAS, VENOUS - Abnormal; Notable for the following components:   pCO2, Ven 72.3 (*)    Bicarbonate 31.0 (*)    Acid-Base Excess 9.8 (*)    All other components within normal limits  TROPONIN I (HIGH SENSITIVITY) - Abnormal; Notable for the following components:   Troponin I (High Sensitivity) 52 (*)    All other components within normal limits  TROPONIN I (HIGH SENSITIVITY) - Abnormal; Notable for the following components:   Troponin I (High Sensitivity) 48 (*)    All other components within normal limits  SARS CORONAVIRUS 2 BY RT PCR (HOSPITAL ORDER, [facility])  HIV ANTIBODY (ROUTINE TESTING W REFLEX)  COMPREHENSIVE METABOLIC PANEL  MAGNESIUM  CBC WITH DIFFERENTIAL/PLATELET  TSH  URINALYSIS, ROUTINE W REFLEX MICROSCOPIC  BLOOD GAS, VENOUS  BLOOD GAS, VENOUS    EKG EKG Interpretation  Date/Time:  Saturday March 27 2020 20:08:04 EDT Ventricular Rate:  64 PR Interval:    QRS Duration: 93 QT Interval:  422 QTC Calculation: 436 R Axis:   93 Text Interpretation: Sinus rhythm LAE, consider biatrial enlargement Probable RVH w/ secondary repol abnormality ST depr, consider ischemia, inferior leads Borderline ST elevation, lateral leads No STEMI Confirmed by Octaviano Glow [phone]) on 03/27/2020 8:58:45  PM   Radiology DG Chest Portable 1 View  Result Date: 03/27/2020 CLINICAL DATA:  Shortness of breath, suspected COPD exacerbation EXAM: PORTABLE CHEST 1 VIEW COMPARISON:  02/18/2020 FINDINGS: Cardiomediastinal contours are enlarged. Blunting of RIGHT and LEFT costodiaphragmatic sulcus. Perhaps slightly increased when compared to the prior study. Increased interstitial markings similar to the prior exam. No lobar consolidation. Visualized skeletal structures on limited assessment without acute process. IMPRESSION: Slight increase in bilateral effusions may indicate heart failure in this patient with marked cardiomegaly. Background interstitial changes are similar and there is chronic interstitial thickening. Superimposed edema is possible though there is no change in this appearance from the previous radiograph. Electronically Signed   By: Zetta Bills M.D.   On: 03/27/2020 20:10    Procedures .Critical Care Performed by: Wyvonnia Dusky, MD Authorized by: Wyvonnia Dusky, MD   Critical care provider statement:    Critical care time (minutes):  55   Critical care was necessary to treat or prevent imminent or life-threatening deterioration of the following conditions:  Respiratory failure   Critical care was time spent personally by me on the following activities:  Discussions with consultants, evaluation of patient's response to treatment, examination of patient, ordering and performing treatments and interventions, ordering  and review of laboratory studies, ordering and review of radiographic studies, pulse oximetry, re-evaluation of patient's condition, obtaining history from patient or surrogate and review of old charts Comments:     Hypercapneic respiratory failure and CHF exacerbation requiring bipap, IV diuresis, repeat VBG labs and bedside reassessment.   (including critical care time)  Medications Ordered in ED Medications  aspirin EC tablet 162 mg (has no administration in time  range)  amLODipine (NORVASC) tablet 10 mg (has no administration in time range)  atorvastatin (LIPITOR) tablet 80 mg (has no administration in time range)  carvedilol (COREG) tablet 12.5 mg (has no administration in time range)  omega-3 acid ethyl esters (LOVAZA) capsule 1 g (has no administration in time range)  irbesartan (AVAPRO) tablet 150 mg (has no administration in time range)  famotidine (PEPCID) tablet 20 mg (has no administration in time range)  pantoprazole (PROTONIX) EC tablet 40 mg (has no administration in time range)  clopidogrel (PLAVIX) tablet 75 mg (has no administration in time range)  ferrous sulfate EC tablet 325 mg (has no administration in time range)  albuterol (VENTOLIN HFA) 108 (90 Base) MCG/ACT inhaler 1 puff (has no administration in time range)  ipratropium-albuterol (DUONEB) 0.5-2.5 (3) MG/3ML nebulizer solution 3 mL (has no administration in time range)  tiotropium (SPIRIVA) inhalation capsule ([facility] use ONLY) 18 mcg (has no administration in time range)  heparin injection 5,000 Units (has no administration in time range)  acetaminophen (TYLENOL) tablet 650 mg (has no administration in time range)    Or  acetaminophen (TYLENOL) suppository 650 mg (has no administration in time range)  oxyCODONE (Oxy IR/ROXICODONE) immediate release tablet 5 mg (has no administration in time range)  traZODone (DESYREL) tablet 25 mg (has no administration in time range)  docusate sodium (COLACE) capsule 100 mg (has no administration in time range)  polyethylene glycol (MIRALAX / GLYCOLAX) packet 17 g (has no administration in time range)  nicotine (NICODERM CQ - dosed in mg/24 hours) patch 14 mg (has no administration in time range)  azithromycin (ZITHROMAX) 500 mg in sodium chloride 0.9 % 250 mL IVPB (has no administration in time range)    Followed by  azithromycin (ZITHROMAX) tablet 500 mg (has no administration in time range)  methylPREDNISolone sodium succinate (SOLU-MEDROL)  125 mg/2 mL injection 60 mg (has no administration in time range)  furosemide (LASIX) injection 80 mg (has no administration in time range)  albumin human 25 % solution 12.5 g (has no administration in time range)  sodium chloride 0.9 % bolus 250 mL (has no administration in time range)  furosemide (LASIX) injection 80 mg (80 mg Intravenous Given 03/27/20 1949)  ipratropium (ATROVENT) 0.02 % nebulizer solution (0.5 mg  Given 03/27/20 2021)  albuterol (PROVENTIL) (2.5 MG/3ML) 0.083% nebulizer solution (5 mg  Given 03/27/20 2021)  ipratropium-albuterol (DUONEB) 0.5-2.5 (3) MG/3ML nebulizer solution 3 mL (3 mLs Nebulization Given 03/27/20 2228)  methylPREDNISolone sodium succinate (SOLU-MEDROL) 125 mg/2 mL injection 125 mg (125 mg Intravenous Given 03/27/20 2237)    ED Course  I have reviewed the triage vital signs and the nursing notes.  Pertinent labs & imaging results that were available during my care of the patient were reviewed by me and considered in my medical decision making (see chart for details).  This is 69 year old female with a history of diastolic heart failure as well as COPD and emphysema on 4 L nasal cannula at baseline, still smoking 1 ppd for many years, here with progressively worsening shortness of breath.  Patient was also reporting new onset edema and orthopnea.  On arrival she was saturating 60% on 15 L nasal cannula, but was awake and mentating well, speaking full sentences.  She was immediately switched to a full nonrebreather mask, with rapid improvement of her oxygenation to 95%.  I performed a bedside ultrasound of her lungs which showed diffuse B-lines all the way to the apices.  Clinically with her peripheral edema, my primary suspicion was for a CHF exacerbation.  I ordered 80 mg of IV Lasix to be given.  I did not hear any active wheezing, and therefore did not order albuterol or steroids initially.  Given her degree of hypoxia initially, the patient was placed on BiPAP.  Her  initial venous gas showed no significant acidosis but did show CO2 retention.  After an hour of BiPAP, we attempted to wean her off onto nonrebreather mask, and recheck the venous gas immediately.  This gas showed worsening of her hypercapnia.  I therefore ordered that she be placed back on BiPAP.  I ordered that albuterol and Solu-Medrol be given at this point.  I suspect this is likely a mixed COPD exacerbation and CHF exacerbation.  After 2 and half hours in the ED she has not produce much urine.  Her blood pressure is been borderline hypotensive but stable.  At this point anticipate she will need admission to the progressive unit or ICU for hypercapnic respiratory failure, continue monitoring and treatment overnight.  She is mentating well and maintaining her airway.  I do not believe she requires intubation at this time.  I suspect her hypercapnea will improve with treatment.  I personally reviewed the patient's blood work as noted above.  I personally reviewed her chest x-ray which showed some vascular congestion.  I personally reviewed her EKG which showed no ST elevations, some inferior T wave inversions.  Her troponins were mildly elevated as expected with CKD and CHF at 52 and 48, flat on repeat .  Less likely PE or ACS with no active chest pain.  Erica [name] was evaluated in Emergency Department on 03/27/2020 for the symptoms described in the history of present illness. She was evaluated in the context of the global COVID-19 pandemic, which necessitated consideration that the patient might be at risk for infection with the SARS-CoV-2 virus that causes COVID-19. Institutional protocols and algorithms that pertain to the evaluation of patients at risk for COVID-19 are in a state of rapid change based on information released by regulatory bodies including the CDC and federal and state organizations. These policies and algorithms were followed during the patient's care in the ED.   Clinical  Course as of Mar 27 2316  Sat Mar 27, 2020  2017  IMPRESSION: Slight increase in bilateral effusions may indicate heart failure in this patient with marked cardiomegaly.  Background interstitial changes are similar and there is chronic interstitial thickening. Superimposed edema is possible though there is no change in this appearance from the previous radiograph.    [MT]  2118 Has been doing well on bipap for the past 40 minutes, we'll ask respiratory to try to switch off to nonrebreather and recheck a VBG now with her 2nd trop.   [MT]  2144 Now off bipap on nonrebreather and appearing comfortable, feeling more comfortable.  98% O2.  Pending 2nd trop and VBG, and admission.   [MT]  2203 VBG repeat has pCO2 elevation compared to initial, and this is after 1 hour bipap.  We'll put her  back on bipap and initiate albuterol nebulizers and steroids, as I now suspect she may have both a COPD and CHF exacerbation.  She still appears extremely comfortable and is mentating well, I do not think she needs to be intubated.  I'll discuss with the hospitalist, as she likely requires ICU monitoring overnight for hypercapneic respiratory failure.   [MT]  2216 Signed out to hospitalist, 2nd trop flat   [MT]    Clinical Course User Index [MT] Mikiah Durall, Carola Rhine, MD    Final Clinical Impression(s) / ED Diagnoses Final diagnoses:  Acute respiratory failure with hypercapnia ([place])  Acute on chronic diastolic congestive heart failure [facility])    Rx / DC Orders ED Discharge Orders    None       Wyvonnia Dusky, MD 03/27/20 2318

## 2020-03-27 NOTE — ED Notes (Signed)
Emergency physician has seen   Blood in lab   Pt reports work of breathing is easier with bi-pap

## 2020-03-27 NOTE — ED Notes (Signed)
No urine output at this time  Family member to bedside

## 2020-03-27 NOTE — ED Notes (Signed)
Placed back on BiPAP given nebulizer duo neb. Breath sounds are with rhonchi

## 2020-03-27 NOTE — ED Notes (Addendum)
Patient placed on BiPAP, given albuterol atrovent nebulizer in line for wheezes.

## 2020-03-28 ENCOUNTER — Other Ambulatory Visit (HOSPITAL_COMMUNITY): Payer: Self-pay | Admitting: *Deleted

## 2020-03-28 ENCOUNTER — Inpatient Hospital Stay (HOSPITAL_COMMUNITY): Payer: Medicare HMO

## 2020-03-28 ENCOUNTER — Other Ambulatory Visit: Payer: Self-pay

## 2020-03-28 DIAGNOSIS — I5031 Acute diastolic (congestive) heart failure: Secondary | ICD-10-CM

## 2020-03-28 LAB — COMPREHENSIVE METABOLIC PANEL
ALT: 29 U/L (ref 0–44)
AST: 39 U/L (ref 15–41)
Albumin: 3.3 g/dL — ABNORMAL LOW (ref 3.5–5.0)
Alkaline Phosphatase: 132 U/L — ABNORMAL HIGH (ref 38–126)
Anion gap: 13 (ref 5–15)
BUN: 45 mg/dL — ABNORMAL HIGH (ref 8–23)
CO2: 31 mmol/L (ref 22–32)
Calcium: 7.8 mg/dL — ABNORMAL LOW (ref 8.9–10.3)
Chloride: 99 mmol/L (ref 98–111)
Creatinine, Ser: 2.3 mg/dL — ABNORMAL HIGH (ref 0.44–1.00)
GFR calc Af Amer: 24 mL/min — ABNORMAL LOW (ref >60)
GFR calc non Af Amer: 21 mL/min — ABNORMAL LOW (ref >60)
Glucose, Bld: 156 mg/dL — ABNORMAL HIGH (ref 70–99)
Potassium: 4.3 mmol/L (ref 3.5–5.1)
Sodium: 143 mmol/L (ref 135–145)
Total Bilirubin: 0.7 mg/dL (ref 0.3–1.2)
Total Protein: 5.9 g/dL — ABNORMAL LOW (ref 6.5–8.1)

## 2020-03-28 LAB — CBC WITH DIFFERENTIAL/PLATELET
Abs Immature Granulocytes: 0.02 10*3/uL (ref 0.00–0.07)
Basophils Absolute: 0 10*3/uL (ref 0.0–0.1)
Basophils Relative: 0 %
Eosinophils Absolute: 0 10*3/uL (ref 0.0–0.5)
Eosinophils Relative: 0 %
HCT: 38.3 % (ref 36.0–46.0)
Hemoglobin: 11.4 g/dL — ABNORMAL LOW (ref 12.0–15.0)
Immature Granulocytes: 0 %
Lymphocytes Relative: 4 %
Lymphs Abs: 0.2 10*3/uL — ABNORMAL LOW (ref 0.7–4.0)
MCH: 26.5 pg (ref 26.0–34.0)
MCHC: 29.8 g/dL — ABNORMAL LOW (ref 30.0–36.0)
MCV: 89.1 fL (ref 80.0–100.0)
Monocytes Absolute: 0 10*3/uL — ABNORMAL LOW (ref 0.1–1.0)
Monocytes Relative: 0 %
Neutro Abs: 4.4 10*3/uL (ref 1.7–7.7)
Neutrophils Relative %: 96 %
Platelets: 124 10*3/uL — ABNORMAL LOW (ref 150–400)
RBC: 4.3 MIL/uL (ref 3.87–5.11)
RDW: 14.8 % (ref 11.5–15.5)
WBC: 4.7 10*3/uL (ref 4.0–10.5)
nRBC: 0 % (ref 0.0–0.2)

## 2020-03-28 LAB — GLUCOSE, CAPILLARY
Glucose-Capillary: 149 mg/dL — ABNORMAL HIGH (ref 70–99)
Glucose-Capillary: 177 mg/dL — ABNORMAL HIGH (ref 70–99)
Glucose-Capillary: 144 mg/dL — ABNORMAL HIGH (ref 70–99)
Glucose-Capillary: 185 mg/dL — ABNORMAL HIGH (ref 70–99)

## 2020-03-28 LAB — URINALYSIS, ROUTINE W REFLEX MICROSCOPIC
Bilirubin Urine: NEGATIVE
Glucose, UA: NEGATIVE mg/dL
Hgb urine dipstick: NEGATIVE
Ketones, ur: NEGATIVE mg/dL
Leukocytes,Ua: NEGATIVE
Nitrite: NEGATIVE
Protein, ur: NEGATIVE mg/dL
Specific Gravity, Urine: 1.012 (ref 1.005–1.030)
pH: 5 (ref 5.0–8.0)

## 2020-03-28 LAB — ECHOCARDIOGRAM COMPLETE
Height: 64 in
Weight: 2264.57 oz

## 2020-03-28 LAB — BLOOD GAS, VENOUS
Acid-Base Excess: 8.5 mmol/L — ABNORMAL HIGH (ref 0.0–2.0)
Acid-Base Excess: 7.9 mmol/L — ABNORMAL HIGH (ref 0.0–2.0)
Bicarbonate: 29.9 mmol/L — ABNORMAL HIGH (ref 20.0–28.0)
Bicarbonate: 28.2 mmol/L — ABNORMAL HIGH (ref 20.0–28.0)
FIO2: 55
FIO2: 55
O2 Saturation: 69.9 %
O2 Saturation: 22.5 %
Patient temperature: 37
Patient temperature: 37
pCO2, Ven: 70.9 mmHg (ref 44.0–60.0)
pCO2, Ven: 72.6 mmHg (ref 44.0–60.0)
pH, Ven: 7.311 (ref 7.250–7.430)
pH, Ven: 7.295 (ref 7.250–7.430)
pO2, Ven: 44.3 mmHg (ref 32.0–45.0)
pO2, Ven: <31 mmHg — CL (ref 32.0–45.0)

## 2020-03-28 LAB — LACTIC ACID, PLASMA
Lactic Acid, Venous: 0.8 mmol/L (ref 0.5–1.9)
Lactic Acid, Venous: 1 mmol/L (ref 0.5–1.9)

## 2020-03-28 LAB — D-DIMER, QUANTITATIVE: D-Dimer, Quant: 0.36 ug/mL-FEU (ref 0.00–0.50)

## 2020-03-28 LAB — MRSA PCR SCREENING: MRSA by PCR: NEGATIVE

## 2020-03-28 LAB — HEMOGLOBIN A1C
Hgb A1c MFr Bld: 5.1 % (ref 4.8–5.6)
Mean Plasma Glucose: 99.67 mg/dL

## 2020-03-28 LAB — TSH: TSH: 0.551 u[IU]/mL (ref 0.350–4.500)

## 2020-03-28 LAB — MAGNESIUM: Magnesium: 1.7 mg/dL (ref 1.7–2.4)

## 2020-03-28 LAB — HIV ANTIBODY (ROUTINE TESTING W REFLEX): HIV Screen 4th Generation wRfx: NONREACTIVE

## 2020-03-28 MED ORDER — CARVEDILOL 3.125 MG PO TABS: 3.1250 mg | ORAL_TABLET | Freq: Two times a day (BID) | ORAL | Status: DC

## 2020-03-28 MED ORDER — DOBUTAMINE IN D5W 4-5 MG/ML-% IV SOLN: 2.5000 ug/kg/min | INTRAVENOUS | Status: DC

## 2020-03-28 MED ORDER — SODIUM CHLORIDE 0.9 % IV SOLN
INTRAVENOUS | Status: DC | PRN
  Administered 2020-03-28: 250 mL via INTRAVENOUS

## 2020-03-28 MED ORDER — CHLORHEXIDINE GLUCONATE CLOTH 2 % EX PADS
6.0000 | MEDICATED_PAD | Freq: Every day | CUTANEOUS | Status: DC
  Administered 2020-03-28 – 2020-03-30 (×3): 6 via TOPICAL

## 2020-03-28 MED ORDER — FUROSEMIDE 10 MG/ML IJ SOLN
4.0000 mg/h | INTRAVENOUS | Status: DC
  Administered 2020-03-28: 4 mg/h via INTRAVENOUS
  Filled 2020-03-28: qty 25

## 2020-03-28 MED ORDER — INSULIN ASPART 100 UNIT/ML ~~LOC~~ SOLN
0.0000 [IU] | SUBCUTANEOUS | Status: DC
  Administered 2020-03-28: 2 [IU] via SUBCUTANEOUS
  Administered 2020-03-28: 1 [IU] via SUBCUTANEOUS
  Administered 2020-03-28: 2 [IU] via SUBCUTANEOUS
  Administered 2020-03-28 – 2020-03-29 (×3): 1 [IU] via SUBCUTANEOUS
  Administered 2020-03-29: 2 [IU] via SUBCUTANEOUS
  Administered 2020-03-29: 1 [IU] via SUBCUTANEOUS
  Administered 2020-03-30: 5 [IU] via SUBCUTANEOUS
  Administered 2020-03-30: 1 [IU] via SUBCUTANEOUS
  Administered 2020-03-30: 2 [IU] via SUBCUTANEOUS

## 2020-03-28 MED ORDER — GUAIFENESIN ER 600 MG PO TB12
600.0000 mg | ORAL_TABLET | Freq: Two times a day (BID) | ORAL | Status: DC
  Administered 2020-03-28 – 2020-03-30 (×5): 600 mg via ORAL
  Filled 2020-03-28 (×5): qty 1

## 2020-03-28 MED ORDER — BUDESONIDE 0.25 MG/2ML IN SUSP
0.2500 mg | Freq: Two times a day (BID) | RESPIRATORY_TRACT | Status: DC
  Administered 2020-03-28 – 2020-03-30 (×4): 0.25 mg via RESPIRATORY_TRACT
  Filled 2020-03-28 (×4): qty 2

## 2020-03-28 MED ORDER — SODIUM CHLORIDE 0.9% FLUSH
3.0000 mL | Freq: Two times a day (BID) | INTRAVENOUS | Status: DC
  Administered 2020-03-28 – 2020-03-30 (×3): 3 mL via INTRAVENOUS

## 2020-03-28 MED ORDER — IPRATROPIUM-ALBUTEROL 0.5-2.5 (3) MG/3ML IN SOLN
3.0000 mL | Freq: Four times a day (QID) | RESPIRATORY_TRACT | Status: DC
  Administered 2020-03-28 – 2020-03-29 (×7): 3 mL via RESPIRATORY_TRACT
  Filled 2020-03-28 (×7): qty 3

## 2020-03-28 MED ORDER — IRBESARTAN 75 MG PO TABS: 75.0000 mg | ORAL_TABLET | Freq: Every day | ORAL | Status: DC

## 2020-03-28 MED ORDER — CHLORHEXIDINE GLUCONATE 0.12 % MT SOLN
15.0000 mL | Freq: Two times a day (BID) | OROMUCOSAL | Status: DC
  Administered 2020-03-28 – 2020-03-29 (×4): 15 mL via OROMUCOSAL
  Filled 2020-03-28 (×4): qty 15

## 2020-03-28 MED ORDER — SODIUM CHLORIDE 0.9 % IV BOLUS
500.0000 mL | Freq: Once | INTRAVENOUS | Status: AC
  Administered 2020-03-28: 500 mL via INTRAVENOUS

## 2020-03-28 MED ORDER — ORAL CARE MOUTH RINSE
15.0000 mL | Freq: Two times a day (BID) | OROMUCOSAL | Status: DC
  Administered 2020-03-28 – 2020-03-29 (×3): 15 mL via OROMUCOSAL

## 2020-03-28 MED ORDER — SODIUM CHLORIDE 0.9 % IV SOLN: INTRAVENOUS | Status: AC

## 2020-03-28 MED ORDER — METOLAZONE 5 MG PO TABS
5.0000 mg | ORAL_TABLET | Freq: Once | ORAL | Status: AC
  Administered 2020-03-28: 5 mg via ORAL
  Filled 2020-03-28: qty 1

## 2020-03-28 MED ORDER — FUROSEMIDE 10 MG/ML IJ SOLN
80.0000 mg | Freq: Once | INTRAMUSCULAR | Status: AC
  Administered 2020-03-28: 80 mg via INTRAVENOUS
  Filled 2020-03-28: qty 8

## 2020-03-28 NOTE — Progress Notes (Signed)
PROGRESS NOTE    Erica [name]  VEH:[id number] DOB: November 02, 1950 DOA: 03/27/2020 PCP: Iona Beard, MD   Brief Narrative:  Per HPI: Erica [name]  is a 69 y.o. female, with history of stroke, HTN, HLD, DMII, COPD, CHF and more presents to the ED with dyspnea. Patient reports onset was yesterday morning. Patient reports that it has been progressively worse. The dyspnea is worse on exertion, and better with rest. She reports orthopnea at home and palpitations. She has no chest pain, diaphoresis, or nausea. She has no history of MI. She is a current smoker. She has associated swelling. Patient reports cough that is productive of thick yellow sputum. She has not had any fevers or body aches. No ill contacts. Patient reports decrease in urine output. Her last void was the AM of 03/27/20. It was non painful, non bloody, and there was not malodorous. Patient reports that she has been constipated. She has tried to take linzess, without relief.   Assessment & Plan:   Active Problems:   Acute respiratory failure with hypoxia (HCC)   Acute combined hypercapnic and hypoxemic respiratory failure-multifactorial -Appears to be related to some diastolic CHF exacerbation as well as COPD, but predominantly more COPD exacerbation -Plan to schedule breathing treatments and continue IV steroids and add Pulmicort -Up in chair with flutter valve and Mucinex -Patient appears to have had poor response to IV Lasix pushes, will try IV Lasix drip and see if there is any increased urine output over the next few hours, if not will discontinue -Consulted nephrology to assist with volume management -Try to wean off by BiPAP -Repeat ABG in a.m.  Acute diastolic CHF exacerbation -Monitor daily weights and I's and O's -2D echocardiogram in a.m. -Trial of Lasix drip to see if there is any increased urine output and improvement -Volume status appears euvolemic on clinical examination, but chest x-ray with some signs of  overload noted  Acute COPD exacerbation -Wears chronic 3-4 L nasal cannula oxygen at home -Likely with some component of chronic hypercapnia as well -Continue azithromycin as well as breathing treatments and steroids -Wean BiPAP -Trial of mucolytic's as well as up in chair and flutter valve -Consider pulmonology consultation by a.m.  History of hypertension now with some hypotension -May be associated with Lasix and/or BiPAP use -Try to wean BiPAP -Hold home Coreg and ARB -Trial Lasix infusion to see how well this is tolerated -She did respond to IV fluid bolus and may simply be intravascularly depleted -Continue to monitor and adjust accordingly  Chronic tobacco use -Nicotine patch -Counseled on cessation  Troponin elevation -No chest pain or signs of ACS noted -Monitor on telemetry -Likely demand ischemia  Stage IV CKD with decreased urine output -Some element of urinary retention noted with Foley placed -Continue strict I's and O's -Trial Lasix infusion and avoid IV fluid for now -Appreciate nephrology assistance    DVT prophylaxis: Heparin Code Status: Full code Family Communication: None at bedside Disposition Plan:   Status is: Inpatient  Remains inpatient appropriate because:IV treatments appropriate due to intensity of illness or inability to take PO and Inpatient level of care appropriate due to severity of illness   Dispo: The patient is from: Home              Anticipated d/c is to: Home              Anticipated d/c date is: 3 days  Patient currently is not medically stable to d/c.  Consultants:   Nephrology  Procedures:   See below  Antimicrobials:  Anti-infectives (From admission, onward)   Start     Dose/Rate Route Frequency Ordered Stop   03/28/20 2300  azithromycin (ZITHROMAX) tablet 500 mg     Discontinue    "Followed by" Linked Group Details   500 mg Oral Daily 03/27/20 2254 04/01/20 2159   03/27/20 2254  azithromycin  (ZITHROMAX) 500 mg in sodium chloride 0.9 % 250 mL IVPB       "Followed by" Linked Group Details   500 mg 250 mL/hr over 60 Minutes Intravenous Every 24 hours 03/27/20 2254 03/28/20 0044       Subjective: Patient seen and evaluated today with no new acute complaints or concerns.  There have been some episodes of hypotension and poor urine output noted overnight.  She is currently on BiPAP and appears comfortable.  She is responsive to questioning and states that she has some congestion.  Objective: Vitals:   03/28/20 0758 03/28/20 0900 03/28/20 0930 03/28/20 0935  BP:  (!) 97/47 (!) 105/47   Pulse:  79 89   Resp:  (!) 28    Temp:  98 F (36.7 C)    TempSrc:  Oral    SpO2: (!) 89% 90%  91%  Weight:      Height:        Intake/Output Summary (Last 24 hours) at 03/28/2020 0936 Last data filed at 03/28/2020 0630 Gross per 24 hour  Intake 614.87 ml  Output 250 ml  Net 364.87 ml   Filed Weights   03/27/20 1934 03/28/20 0037 03/28/20 0500  Weight: 63 kg 63.5 kg 64.2 kg    Examination:  General exam: Appears calm and comfortable  Respiratory system: Clear to auscultation. Respiratory effort normal.  Currently on BiPAP. Cardiovascular system: S1 & S2 heard, RRR. No JVD, murmurs, rubs, gallops or clicks. No pedal edema. Gastrointestinal system: Abdomen is nondistended, soft and nontender. No organomegaly or masses felt. Normal bowel sounds heard. Central nervous system: Alert and oriented. No focal neurological deficits. Extremities: Symmetric 5 x 5 power. Skin: No rashes, lesions or ulcers Psychiatry: Judgement and insight appear normal. Mood & affect appropriate.     Data Reviewed: I have personally reviewed following labs and imaging studies  CBC: Recent Labs  Lab 03/27/20 1920 03/28/20 0505  WBC 5.4 4.7  NEUTROABS 4.1 4.4  HGB 12.4 11.4*  HCT 41.5 38.3  MCV 89.8 89.1  PLT 144* 539*   Basic Metabolic Panel: Recent Labs  Lab 03/27/20 1920 03/28/20 0505  NA 142  143  K 3.9 4.3  CL 98 99  CO2 32 31  GLUCOSE 100* 156*  BUN 45* 45*  CREATININE 2.51* 2.30*  CALCIUM 7.9* 7.8*  MG  --  1.7   GFR: Estimated Creatinine Clearance: 19.7 mL/min (A) (by C-G formula based on SCr of 2.3 mg/dL (H)). Liver Function Tests: Recent Labs  Lab 03/28/20 0505  AST 39  ALT 29  ALKPHOS 132*  BILITOT 0.7  PROT 5.9*  ALBUMIN 3.3*   No results for input(s): LIPASE, AMYLASE in the last 168 hours. No results for input(s): AMMONIA in the last 168 hours. Coagulation Profile: No results for input(s): INR, PROTIME in the last 168 hours. Cardiac Enzymes: No results for input(s): CKTOTAL, CKMB, CKMBINDEX, TROPONINI in the last 168 hours. BNP (last 3 results) No results for input(s): PROBNP in the last 8760 hours. HbA1C: No results for input(s):  HGBA1C in the last 72 hours. CBG: Recent Labs  Lab 03/28/20 0740  GLUCAP 149*   Lipid Profile: No results for input(s): CHOL, HDL, LDLCALC, TRIG, CHOLHDL, LDLDIRECT in the last 72 hours. Thyroid Function Tests: Recent Labs    03/28/20 0505  TSH 0.551   Anemia Panel: No results for input(s): VITAMINB12, FOLATE, FERRITIN, TIBC, IRON, RETICCTPCT in the last 72 hours. Sepsis Labs: Recent Labs  Lab 03/28/20 0505 03/28/20 0752  LATICACIDVEN 0.8 1.0    Recent Results (from the past 240 hour(s))  SARS Coronavirus 2 by RT PCR (hospital order, performed in [facility] hospital lab) Nasopharyngeal Nasopharyngeal Swab     Status: None   Collection Time: 03/27/20 10:58 PM   Specimen: Nasopharyngeal Swab  Result Value Ref Range Status   SARS Coronavirus 2 NEGATIVE NEGATIVE Final    Comment: (NOTE) SARS-CoV-2 target nucleic acids are NOT DETECTED.  The SARS-CoV-2 RNA is generally detectable in upper and lower respiratory specimens during the acute phase of infection. The lowest concentration of SARS-CoV-2 viral copies this assay can detect is 250 copies / mL. A negative result does not preclude SARS-CoV-2  infection and should not be used as the sole basis for treatment or other patient management decisions.  A negative result may occur with improper specimen collection / handling, submission of specimen other than nasopharyngeal swab, presence of viral mutation(s) within the areas targeted by this assay, and inadequate number of viral copies (<250 copies / mL). A negative result must be combined with clinical observations, patient history, and epidemiological information.  Fact Sheet for Patients:   StrictlyIdeas.no  Fact Sheet for Healthcare Providers: BankingDealers.co.za  This test is not yet approved or  cleared by the [place] FDA and has been authorized for detection and/or diagnosis of SARS-CoV-2 by FDA under an Emergency Use Authorization (EUA).  This EUA will remain in effect (meaning this test can be used) for the duration of the COVID-19 declaration under Section 564(b)(1) of the Act, 21 U.S.C. section 360bbb-3(b)(1), unless the authorization is terminated or revoked sooner.  Performed at [facility], [address], [place], [place] [address]   MRSA PCR Screening     Status: None   Collection Time: 03/28/20 12:21 AM   Specimen: Nasal Mucosa; Nasopharyngeal  Result Value Ref Range Status   MRSA by PCR NEGATIVE NEGATIVE Final    Comment:        The GeneXpert MRSA Assay (FDA approved for NASAL specimens only), is one component of a comprehensive MRSA colonization surveillance program. It is not intended to diagnose MRSA infection nor to guide or monitor treatment for MRSA infections. Performed at [facility], [address], [place], [place] [address]          Radiology Studies: DG CHEST PORT 1 VIEW  Result Date: 03/28/2020 CLINICAL DATA:  69 year old female with history of COPD exacerbation. EXAM: PORTABLE CHEST 1 VIEW COMPARISON:  Chest x-ray 03/27/2020. FINDINGS: Right-sided calcified pleural plaques are  again noted. Lung volumes are slightly low with widespread areas of interstitial prominence and scattered areas of bronchiectasis, similar to prior studies, compatible with underlying interstitial lung disease. Small bilateral pleural effusions. No pneumothorax. No evidence of pulmonary edema. Heart size is mildly enlarged. Upper mediastinal contours are within normal limits. Aortic atherosclerosis. IMPRESSION: 1. Small bilateral pleural effusions. 2. Mild cardiomegaly. 3. Chronic interstitial lung disease, similar to prior studies. 4. Aortic atherosclerosis. Electronically Signed   By: Vinnie Langton M.D.   On: 03/28/2020 08:07   DG  Chest Portable 1 View  Result Date: 03/27/2020 CLINICAL DATA:  Shortness of breath, suspected COPD exacerbation EXAM: PORTABLE CHEST 1 VIEW COMPARISON:  02/18/2020 FINDINGS: Cardiomediastinal contours are enlarged. Blunting of RIGHT and LEFT costodiaphragmatic sulcus. Perhaps slightly increased when compared to the prior study. Increased interstitial markings similar to the prior exam. No lobar consolidation. Visualized skeletal structures on limited assessment without acute process. IMPRESSION: Slight increase in bilateral effusions may indicate heart failure in this patient with marked cardiomegaly. Background interstitial changes are similar and there is chronic interstitial thickening. Superimposed edema is possible though there is no change in this appearance from the previous radiograph. Electronically Signed   By: Zetta Bills M.D.   On: 03/27/2020 20:10        Scheduled Meds: . aspirin EC  162 mg Oral Daily  . atorvastatin  80 mg Oral Daily  . azithromycin  500 mg Oral Daily  . budesonide (PULMICORT) nebulizer solution  0.25 mg Nebulization BID  . chlorhexidine  15 mL Mouth Rinse BID  . Chlorhexidine Gluconate Cloth  6 each Topical Daily  . clopidogrel  75 mg Oral Daily  . docusate sodium  100 mg Oral BID  . famotidine  20 mg Oral Daily  . ferrous sulfate   325 mg Oral BID  . guaiFENesin  600 mg Oral BID  . heparin  5,000 Units Subcutaneous Q8H  . insulin aspart  0-9 Units Subcutaneous Q4H  . ipratropium-albuterol  3 mL Nebulization Q6H  . mouth rinse  15 mL Mouth Rinse q12n4p  . methylPREDNISolone (SOLU-MEDROL) injection  60 mg Intravenous Q12H  . nicotine  14 mg Transdermal Daily  . omega-3 acid ethyl esters  1 g Oral Daily  . pantoprazole  40 mg Oral Daily  . umeclidinium bromide  1 puff Inhalation Daily   Continuous Infusions: . sodium chloride 10 mL/hr at 03/28/20 0630  . sodium chloride Stopped (03/28/20 0806)  . furosemide (LASIX) infusion 4 mg/hr (03/28/20 0908)     LOS: 1 day    Time spent: 40 minutes    Courtnei Ruddell D Manuella Ghazi, DO [facility]  If 7PM-7AM, please contact night-coverage [email or web address] 03/28/2020, 9:36 AM

## 2020-03-28 NOTE — Progress Notes (Signed)
BIPAP on standby at this time.  RT will continue to monitor.

## 2020-03-28 NOTE — Progress Notes (Signed)
Dr. Clearence Ped notified of critical values PCO2 72.6 and PO2 < 31.

## 2020-03-28 NOTE — Consult Note (Signed)
Erica [name] Erica [name] Renal Consultation Note  Requesting MD: Erica Lark DO Indication for Consultation:  CKD   Chief complaint: shortness of breath  HPI: Erica [name] is a 69 y.o. female with a history including CKD stage IV, hypertension, tobacco abuse, COPD on home oxygen, chronic diastolic CHF, diabetes, and history of CVA who presented to the hospital with shortness of breath.  Nephrology is consulted for assistance with management of diuretics. Her creatinine is stable - trends as below.  Has a baseline of around 2.5 - 3 which had improved to around 2.3 most recently.  Urine output is charted as 160 mL thus far on 7/4 and 250 mL for 7/3; strict ins/outs do not seem to be available - has a purewick.  Note per chart review she was given a dose of lasix 80 mg on 7/3 and was started on a lasix gtt which is at 4 mg/hr.  Note that she is also charted as getting 750 mL fluid bolus on 7/3.  She was transitioned to BIPAP and then weaned to 6 liters high flow nasal cannula as of my exam.  She states she's normally on 3 liters oyxgen at home unless she feels bad - then will increase to 4 liters.  She feels much better now.  States sometimes hard to urinate and when she goes she urinates "for a long time".  She confirms on torsemide three times a week but she isn't able to confirm dose.  She has been evaluated by nephrology during prior admission in August 2020.  States she hasn't seen anyone in clinic - previously had a "Erica doctor" in [place], [place] but they had a falling out some time ago and parted ways.    Creatinine, Ser  Date/Time Value Ref Range Status  03/28/2020 05:05 AM 2.30 (H) 0.44 - 1.00 mg/dL Final  03/27/2020 07:20 PM 2.51 (H) 0.44 - 1.00 mg/dL Final  02/18/2020 03:06 PM 2.35 (H) 0.44 - 1.00 mg/dL Final  02/07/2020 05:03 PM 2.23 (H) 0.44 - 1.00 mg/dL Final  05/23/2019 08:55 AM 2.81 (H) 0.44 - 1.00 mg/dL Final  05/22/2019 07:17 AM 3.09 (H) 0.44 - 1.00 mg/dL Final  05/22/2019  07:17 AM 3.21 (H) 0.44 - 1.00 mg/dL Final  05/21/2019 03:23 PM 3.27 (H) 0.44 - 1.00 mg/dL Final  05/21/2019 11:35 AM 3.32 (H) 0.44 - 1.00 mg/dL Final  12/26/2018 04:49 AM 2.54 (H) 0.44 - 1.00 mg/dL Final  12/25/2018 06:52 PM 2.47 (H) 0.44 - 1.00 mg/dL Final  09/06/2018 07:44 AM 2.40 (H) 0.44 - 1.00 mg/dL Final  09/05/2018 04:32 AM 2.83 (H) 0.44 - 1.00 mg/dL Final  09/04/2018 03:53 AM 2.42 (H) 0.44 - 1.00 mg/dL Final  09/03/2018 11:21 AM 1.72 (H) 0.44 - 1.00 mg/dL Final  04/11/2018 06:39 PM 4.44 (H) 0.44 - 1.00 mg/dL Final  03/10/2018 06:00 AM 1.52 (H) 0.44 - 1.00 mg/dL Final  03/09/2018 01:48 PM 1.30 (H) 0.44 - 1.00 mg/dL Final  01/24/2018 04:50 AM 1.29 (H) 0.44 - 1.00 mg/dL Final  01/23/2018 10:52 AM 1.71 (H) 0.44 - 1.00 mg/dL Final  12/21/2017 06:04 AM 1.57 (H) 0.44 - 1.00 mg/dL Final  12/20/2017 04:50 AM 1.94 (H) 0.44 - 1.00 mg/dL Final  12/19/2017 04:21 AM 2.17 (H) 0.44 - 1.00 mg/dL Final  12/18/2017 09:24 AM 1.85 (H) 0.44 - 1.00 mg/dL Final  12/17/2017 05:50 PM 1.60 (H) 0.44 - 1.00 mg/dL Final  12/17/2017 05:39 PM 1.65 (H) 0.44 - 1.00 mg/dL Final  12/10/2017 06:09 AM 1.84 (H) 0.44 -  1.00 mg/dL Final  12/09/2017 06:24 AM 1.83 (H) 0.44 - 1.00 mg/dL Final  12/08/2017 07:32 AM 1.71 (H) 0.44 - 1.00 mg/dL Final  12/07/2017 12:19 PM 1.70 (H) 0.44 - 1.00 mg/dL Final  09/19/2017 01:48 PM 1.17 (H) 0.44 - 1.00 mg/dL Final  08/30/2017 04:21 AM 1.46 (H) 0.44 - 1.00 mg/dL Final  08/29/2017 05:02 AM 1.41 (H) 0.44 - 1.00 mg/dL Final  08/28/2017 04:36 AM 1.33 (H) 0.44 - 1.00 mg/dL Final  08/27/2017 04:28 PM 1.62 (H) 0.44 - 1.00 mg/dL Final  02/13/2017 04:42 AM 1.55 (H) 0.44 - 1.00 mg/dL Final  02/12/2017 04:29 AM 1.41 (H) 0.44 - 1.00 mg/dL Final  02/11/2017 01:03 PM 1.21 (H) 0.44 - 1.00 mg/dL Final  12/18/2016 04:15 PM 1.40 (H) 0.44 - 1.00 mg/dL Final  01/04/2016 01:45 PM 1.70 (H) 0.44 - 1.00 mg/dL Final  11/21/2015 05:18 AM 1.17 (H) 0.44 - 1.00 mg/dL Final  11/20/2015 10:24 AM 1.15 (H) 0.44 -  1.00 mg/dL Final  09/07/2015 05:57 AM 1.01 (H) 0.44 - 1.00 mg/dL Final  09/06/2015 07:03 AM 1.12 (H) 0.44 - 1.00 mg/dL Final  09/05/2015 02:18 PM 1.22 (H) 0.44 - 1.00 mg/dL Final  11/29/2007 07:20 PM 1.24 (H)  Final  09/18/2007 10:40 PM 0.81  Final     PMHx:   Past Medical History:  Diagnosis Date  . Arthritis   . CHF (congestive heart failure) ([place])   . Chronic back pain   . COPD (chronic obstructive pulmonary disease) (HCC)    2L home O2  . Diabetes mellitus without complication ([place])   . Hyperlipidemia   . Hypertension   . Stroke (cerebrum) [facility])     Past Surgical History:  Procedure Laterality Date  . ABDOMINAL HYSTERECTOMY    . colonoscopy  2009   Dr. Gala [name]: cluster of diminutive rectal polyps and 2 diminutive polyps in rectosigmoid junction, hyperplastic  . COLONOSCOPY WITH PROPOFOL N/A 10/18/2017   Procedure: COLONOSCOPY WITH PROPOFOL;  Surgeon: Erica Dolin, MD;  Location[facility];  Service: Endoscopy;  Laterality: N/A;  9:30am  . ESOPHAGOGASTRODUODENOSCOPY N/A 01/25/2018   Procedure: ESOPHAGOGASTRODUODENOSCOPY (EGD);  Surgeon: Erica Binder, MD;  Location: [facility];  Service: Endoscopy;  Laterality: N/A;  . ESOPHAGOGASTRODUODENOSCOPY (EGD) WITH PROPOFOL N/A 08/29/2017   Dr. Gala [name] with propofol: small hiatal hernia. 4cm fundal diverticulum.   Marland Kitchen GIVENS CAPSULE STUDY N/A 01/25/2018   Procedure: GIVENS CAPSULE STUDY;  Surgeon: Erica Binder, MD;  Location: [facility];  Service: Endoscopy;  Laterality: N/A;  . left hand middle finger     in an accident at work, amputated    Family Hx:  Family History  Problem Relation Age of Onset  . CVA Mother 13  . Cancer Neg Hx   . Colon cancer Neg Hx   . Breast cancer Neg Hx   denies family hx of ESRD  Social History:  reports that she has been smoking cigarettes. She has a 13.00 pack-year smoking history. She has never used smokeless tobacco. She reports that she does not drink alcohol and does not use  drugs.  Allergies: No Known Allergies  Medications: Prior to Admission medications   Medication Sig Start Date End Date Taking? Authorizing Provider  albuterol (VENTOLIN HFA) 108 (90 Base) MCG/ACT inhaler Inhale 1 puff into the lungs every 6 (six) hours as needed for shortness of breath. 03/25/20  Yes [provider]  amLODipine (NORVASC) 10 MG tablet Take 10 mg by mouth daily.  10/31/19  Yes  [provider]  aspirin EC 81 MG tablet Take 162 mg by mouth daily.   Yes [provider]  atorvastatin (LIPITOR) 80 MG tablet Take 80 mg by mouth daily.  12/04/16  Yes [provider]  carvedilol (COREG) 12.5 MG tablet Take 12.5 mg by mouth 2 (two) times daily.  10/31/19  Yes [provider]  cefdinir (OMNICEF) 300 MG capsule Take 1 capsule by mouth at bedtime. 03/25/20  Yes [provider]  clopidogrel (PLAVIX) 75 MG tablet Take 75 mg by mouth daily.  03/29/18  Yes [provider]  doxycycline (VIBRAMYCIN) 100 MG capsule Take 100 mg by mouth 2 (two) times daily. 03/25/20  Yes [provider]  famotidine (PEPCID) 20 MG tablet Take 20 mg by mouth daily.  03/29/18  Yes [provider]  ferrous sulfate 325 (65 FE) MG EC tablet Take 1 tablet (325 mg total) by mouth 2 (two) times daily. 01/25/18 03/27/20 Yes Erline Hau, MD  ipratropium-albuterol (DUONEB) 0.5-2.5 (3) MG/3ML SOLN Take 3 mLs by nebulization every 6 (six) hours as needed (for shortness of breath).   Yes [provider]  Linaclotide (LINZESS) 145 MCG CAPS capsule Take 145 mcg by mouth daily as needed (for constipation).    Yes [provider]  omega-3 acid ethyl esters (LOVAZA) 1 g capsule Take 1 g by mouth daily.  12/07/16  Yes [provider]  pantoprazole (PROTONIX) 40 MG tablet Take 1 tablet (40 mg total) by mouth daily. 01/25/18  Yes Isaac Bliss, Rayford Halsted, MD  SPIRIVA HANDIHALER 18 MCG inhalation capsule Take 1 puff by mouth daily. 10/31/17   Yes [provider]  SYMBICORT 80-4.5 MCG/ACT inhaler Inhale 2 puffs into the lungs 2 (two) times daily. 03/15/20  Yes [provider]  torsemide (DEMADEX) 20 MG tablet Take 1 tablet (20 mg total) by mouth daily. Take 1 tablet on Monday, Wednesday, and Friday only. 02/26/19  Yes Arnoldo Lenis, MD  valsartan (DIOVAN) 160 MG tablet Take 160 mg by mouth daily. 03/15/20  Yes [provider]    I have reviewed the patient's current and reported prior to admission medications.  Labs:  BMP Latest Ref Rng & Units 03/28/2020 03/27/2020 02/18/2020  Glucose 70 - 99 mg/dL 156(H) 100(H) 191(H)  BUN 8 - 23 mg/dL 45(H) 45(H) 54(H)  Creatinine 0.44 - 1.00 mg/dL 2.30(H) 2.51(H) 2.35(H)  Sodium 135 - 145 mmol/L 143 142 139  Potassium 3.5 - 5.1 mmol/L 4.3 3.9 4.4  Chloride 98 - 111 mmol/L 99 98 105  CO2 22 - 32 mmol/L 31 32 26  Calcium 8.9 - 10.3 mg/dL 7.8(L) 7.9(L) 8.6(L)    Urinalysis    Component Value Date/Time   COLORURINE YELLOW 03/28/2020 0500   APPEARANCEUR CLEAR 03/28/2020 0500   LABSPEC 1.012 03/28/2020 0500   PHURINE 5.0 03/28/2020 0500   GLUCOSEU NEGATIVE 03/28/2020 0500   HGBUR NEGATIVE 03/28/2020 0500   BILIRUBINUR NEGATIVE 03/28/2020 0500   KETONESUR NEGATIVE 03/28/2020 0500   PROTEINUR NEGATIVE 03/28/2020 0500   UROBILINOGEN 0.2 03/14/2013 1847   NITRITE NEGATIVE 03/28/2020 0500   LEUKOCYTESUR NEGATIVE 03/28/2020 0500     ROS:  Pertinent items noted in HPI and remainder of comprehensive ROS otherwise negative.    Physical Exam: Vitals:   03/28/20 1330 03/28/20 1344  BP:    Pulse:  74  Resp:  20  Temp:    SpO2: (!) 86% 91%     General: adult female in chair in NAD at  rest HEENT: NCAT Eyes: EOMI sclera anicteric Neck: supple trachea midline Heart: S1S2 no rub Lungs: crackles bilateral bases on 6 liters oxygen unlabored at rest Abdomen: soft nt nd Extremities: trace edema bilateral lower extremities; no cyanosis or clubbing Skin: no rash on  extremities exposed Neuro: alert and oriented x 3 provides hx and follows commands  psych - normal mood and affect  Assessment/Plan:  # Acute on Chronic hypoxic resp failure  - on 3-4 liters oxygen at home  - has COPD and CHF  - nebs and steroids per primary team   # Acute on chronic diastolic CHF  - discontinue lasix gtt  - lasix 80 mg IV once this afternoon - metolazone 5 mg po once 30 minutes before the lasix  - will reassess additional lasix tomorrow    # CKD stage IV - Note longstanding HTN, DM, prior NSAID use as well as congenitally atrophied right Erica and echogenic left Erica.  Baseline Cr near 2.3 recently - Renal function stable  - Check post-void residual and in/out per protocol.  Strict ins/outs  # HTN  - acceptable control    # Anemia - normocytic - may be secondary to chronic disease - no indication for ESA   dispo - per primary team   Claudia Desanctis 03/28/2020, 3:13 PM

## 2020-03-28 NOTE — Progress Notes (Signed)
Dr. Clearence Ped notified B/P 86/36 MAP 52. HR 68. RR21. SpO2 93. Bolus 2/3 complete.

## 2020-03-28 NOTE — Progress Notes (Signed)
Dr. Clearence Ped notified PCO2 70.9. B/P 85/32 MAP 51. HR 73, RR 20. SpO2 92

## 2020-03-28 NOTE — Progress Notes (Signed)
*  PRELIMINARY RESULTS* Echocardiogram 2D Echocardiogram has been performed.  Erica [name] 03/28/2020, 9:47 AM

## 2020-03-28 NOTE — Progress Notes (Signed)
Discussed EKG with cardiology fellow. No concern for the reading "minimal ST elevation" as it has been present in prior EKGs, and also no CP, downtrending trop.  Patient has remained hypotensive despite small boluses and albumin.  BP does minimally improve during a bolus.  Start pressors.  Still no urine output.  Still seems to be intravascularly dry.  Will repeat CXR to eval for progression of edema in the setting of boluses.

## 2020-03-28 NOTE — Progress Notes (Signed)
Dr. Clearence Ped notified B/P 97/43 MAP 62. Says she cannot void. Bladder scan 357

## 2020-03-28 NOTE — Plan of Care (Signed)
Progressing

## 2020-03-28 NOTE — Progress Notes (Signed)
Dr. Clearence Ped notified B/P 77/33 MAP 47. HR 63, RR 21, SpO2 92.

## 2020-03-29 LAB — BASIC METABOLIC PANEL
Anion gap: 14 (ref 5–15)
BUN: 58 mg/dL — ABNORMAL HIGH (ref 8–23)
CO2: 31 mmol/L (ref 22–32)
Calcium: 8.4 mg/dL — ABNORMAL LOW (ref 8.9–10.3)
Chloride: 97 mmol/L — ABNORMAL LOW (ref 98–111)
Creatinine, Ser: 2.34 mg/dL — ABNORMAL HIGH (ref 0.44–1.00)
GFR calc Af Amer: 24 mL/min — ABNORMAL LOW (ref >60)
GFR calc non Af Amer: 20 mL/min — ABNORMAL LOW (ref >60)
Glucose, Bld: 152 mg/dL — ABNORMAL HIGH (ref 70–99)
Potassium: 5.1 mmol/L (ref 3.5–5.1)
Sodium: 142 mmol/L (ref 135–145)

## 2020-03-29 LAB — CBC
HCT: 38.8 % (ref 36.0–46.0)
Hemoglobin: 11.7 g/dL — ABNORMAL LOW (ref 12.0–15.0)
MCH: 26.7 pg (ref 26.0–34.0)
MCHC: 30.2 g/dL (ref 30.0–36.0)
MCV: 88.6 fL (ref 80.0–100.0)
Platelets: 151 10*3/uL (ref 150–400)
RBC: 4.38 MIL/uL (ref 3.87–5.11)
RDW: 14.6 % (ref 11.5–15.5)
WBC: 5.7 10*3/uL (ref 4.0–10.5)
nRBC: 0 % (ref 0.0–0.2)

## 2020-03-29 LAB — GLUCOSE, CAPILLARY
Glucose-Capillary: 109 mg/dL — ABNORMAL HIGH (ref 70–99)
Glucose-Capillary: 118 mg/dL — ABNORMAL HIGH (ref 70–99)
Glucose-Capillary: 140 mg/dL — ABNORMAL HIGH (ref 70–99)
Glucose-Capillary: 146 mg/dL — ABNORMAL HIGH (ref 70–99)
Glucose-Capillary: 150 mg/dL — ABNORMAL HIGH (ref 70–99)
Glucose-Capillary: 179 mg/dL — ABNORMAL HIGH (ref 70–99)

## 2020-03-29 LAB — MAGNESIUM: Magnesium: 1.9 mg/dL (ref 1.7–2.4)

## 2020-03-29 MED ORDER — ENSURE ENLIVE PO LIQD
237.0000 mL | Freq: Two times a day (BID) | ORAL | Status: DC
  Administered 2020-03-30: 237 mL via ORAL

## 2020-03-29 MED ORDER — METHYLPREDNISOLONE SODIUM SUCC 40 MG IJ SOLR
40.0000 mg | Freq: Two times a day (BID) | INTRAMUSCULAR | Status: DC
  Administered 2020-03-29 – 2020-03-30 (×2): 40 mg via INTRAVENOUS
  Filled 2020-03-29 (×2): qty 1

## 2020-03-29 MED ORDER — PRO-STAT SUGAR FREE PO LIQD
30.0000 mL | Freq: Two times a day (BID) | ORAL | Status: DC
  Administered 2020-03-29 – 2020-03-30 (×2): 30 mL via ORAL
  Filled 2020-03-29 (×2): qty 30

## 2020-03-29 MED ORDER — FUROSEMIDE 10 MG/ML IJ SOLN
80.0000 mg | Freq: Once | INTRAMUSCULAR | Status: AC
  Administered 2020-03-29: 80 mg via INTRAVENOUS
  Filled 2020-03-29: qty 8

## 2020-03-29 NOTE — Progress Notes (Signed)
[facility] Progress Note  Name: Erica [name] MRN: [id number] DOB: Feb 21, 1951  Chief Complaint:  Shortness of breath   Subjective:  She was on lasix gtt at 4 mg/hr on consult.  Stopped this, gave metolazone 5 mg once and gave lasix 80 mg IV once on 7/4.  She had 1.4 liters UOP over 7/4.  Some issues with collecting all urine. Bladder scanned at 10 mL on 7/4.  Note she has a foley in - not a purewick as I previously charted.  Banana on tray yest but on renal diet.   Review of systems:  Denies shortness of breath  Denies cp Denies n/v  ------------- Background on consult:  Erica [name] is a 69 y.o. female with a history including CKD stage IV, hypertension, tobacco abuse, COPD on home oxygen, chronic diastolic CHF, diabetes, and history of CVA who presented to the hospital with shortness of breath.  Nephrology is consulted for assistance with management of diuretics. Her creatinine is stable - trends as below.  Has a baseline of around 2.5 - 3 which had improved to around 2.3 most recently.  Urine output is charted as 160 mL thus far on 7/4 and 250 mL for 7/3; strict ins/outs do not seem to be available - has a purewick.  Note per chart review she was given a dose of lasix 80 mg on 7/3 and was started on a lasix gtt which is at 4 mg/hr.  Note that she is also charted as getting 750 mL fluid bolus on 7/3.  She was transitioned to BIPAP and then weaned to 6 liters high flow nasal cannula as of my exam.  She states she's normally on 3 liters oyxgen at home unless she feels bad - then will increase to 4 liters.  She feels much better now.  States sometimes hard to urinate and when she goes she urinates "for a long time".  She confirms on torsemide three times a week but she isn't able to confirm dose.  She has been evaluated by nephrology during prior admission in August 2020.  States she hasn't seen anyone in clinic - previously had a "kidney doctor" in [place], [place] but they had a  falling out some time ago and parted ways.   Intake/Output Summary (Last 24 hours) at 03/29/2020 0915 Last data filed at 03/29/2020 0700 Gross per 24 hour  Intake 674 ml  Output 1435 ml  Net -761 ml    Vitals:  Vitals:   03/29/20 0534 03/29/20 0700 03/29/20 0755 03/29/20 0910  BP: (!) 110/50 (!) 124/52    Pulse: 72 80    Resp: (!) 23 20    Temp:   98.1 F (36.7 C)   TempSrc:   Oral   SpO2: 95% 96%  96%  Weight:      Height:         Physical Exam:  General adult female in bed in no acute distress HEENT normocephalic atraumatic extraocular movements intact sclera anicteric Neck supple trachea midline Lungs basilar crackles (improved) on auscultation bilaterally; rare wheezing; normal work of breathing at rest on 5 liters oxygen Heart S1S2 no rub  Abdomen soft nontender nondistended Extremities trace to no edema  Psych normal mood and affect Neuro alert and oriented x 3 provides hx and follows commands gu - foley in place with yellow urine   Medications reviewed    Labs:  BMP Latest Ref Rng & Units 03/29/2020 03/28/2020 03/27/2020  Glucose 70 - 99 mg/dL  152(H) 156(H) 100(H)  BUN 8 - 23 mg/dL 58(H) 45(H) 45(H)  Creatinine 0.44 - 1.00 mg/dL 2.34(H) 2.30(H) 2.51(H)  Sodium 135 - 145 mmol/L 142 143 142  Potassium 3.5 - 5.1 mmol/L 5.1 4.3 3.9  Chloride 98 - 111 mmol/L 97(L) 99 98  CO2 22 - 32 mmol/L 31 31 32  Calcium 8.9 - 10.3 mg/dL 8.4(L) 7.8(L) 7.9(L)     Assessment/Plan:  # Acute on Chronic hypoxic resp failure  - has both COPD and CHF.  on 3-4 liters oxygen at home  - optimize volume as below   - continue oxygen support here - weaned to 5 liters per charting   - nebs and steroids per primary team   # Acute on chronic diastolic CHF - note home regimen appears to be 20 mg MWF - she cannot specifically confirm dose but does state she takes torsemide MWF - lasix 80 mg IV once  # CKD stage IV - Note longstanding HTN, DM, prior NSAID use as well as congenitally  atrophied right kidney and echogenic left kidney.  Baseline Cr near 2.3 recently.  Bladder scan negligible - Renal function stable  - Strict ins/outs - remove foley and monitor for retention  # HTN  - acceptable control    # Anemia - normocytic - may be secondary to chronic disease - no indication for ESA   dispo - per primary team   Claudia Desanctis, MD 03/29/2020 9:34 AM

## 2020-03-29 NOTE — Evaluation (Signed)
Physical Therapy Evaluation Patient Details Name: Erica [name] MRN: [id number] DOB: Aug 19, 1951 Today's Date: 03/29/2020   History of Present Illness  Erica [name]  is a 69 y.o. female, with history of stroke, HTN, HLD, DMII, COPD, CHF and more presents to the ED with dyspnea. Patient reports onset was yesterday morning. Patient reports that it has been progressively worse. The dyspnea is worse on exertion, and better with rest. She reports orthopnea at home and palpitations. She has no chest pain, diaphoresis, or nausea. She has no history of MI. She is a current smoker. She has associated swelling. Patient reports cough that is productive of thick yellow sputum. She has not had any fevers or body aches. No ill contacts. Patient reports decrease in urine output. Her last void was the AM of 03/27/20. It was non painful, non bloody, and there was not malodorous. Patient reports that she has been constipated. She has tried to take linzess, without relief.  [name]  is a 69 y.o. female, with history of stroke, HTN, HLD, DMII, COPD, CHF and more presents to the ED with dyspnea. Patient reports onset was yesterday morning. Patient reports that it has been progressively worse. The dyspnea is worse on exertion, and better with rest. She reports orthopnea at home and palpitations. She has no chest pain, diaphoresis, or nausea. She has no history of MI. She is a current smoker. She has associated swelling. Patient reports cough that is productive of thick yellow sputum. She has not had any fevers or body aches. No ill contacts. Patient reports decrease in urine output. Her last void was the AM of 03/27/20. It was non painful, non bloody, and there was not malodorous. Patient reports that she has been constipated. She has tried to take linzess, without relief.    Clinical Impression  Patient functioning near baseline for functional mobility and gait, other than occasional stumbling during ambulation mostly due  to wearing bed room shoes that are too large and patient advised to wear non-skid socks while in hospital and replace bed room shoes with proper size.  Patient on 3 LPM during ambulation with SpO2 at 88-92% and tolerated sitting up in chair after therapy.  Plan:  Patient discharged from physical therapy to care of nursing for ambulation daily as tolerated for length of stay.     Follow Up Recommendations No PT follow up;Supervision - Intermittent    Equipment Recommendations  None recommended by PT    Recommendations for Other Services       Precautions / Restrictions Precautions Precautions: Fall Restrictions Weight Bearing Restrictions: No      Mobility  Bed Mobility Overal bed mobility: Independent                Transfers Overall transfer level: Independent Equipment used: None             General transfer comment: completed bed mobility, transfer from bed to chair and walked around ICU with close supervision to independence - while ambulating slight LOB x 2 due to loose slippers, not patient's functional level.  recommended different shoes and or slipper socks, which patient then donned independently.  Ambulation/Gait Ambulation/Gait assistance: Modified independent (Device/Increase time);Supervision Gait Distance (Feet): 100 Feet Assistive device: None Gait Pattern/deviations: WFL(Within Functional Limits) Gait velocity: slightly decreased   General Gait Details: grossly WFL except occasionl stumbling due to bed room shoes to large, no loss of balance  Stairs            Wheelchair  Mobility    Modified Rankin (Stroke Patients Only)       Balance Overall balance assessment: Mild deficits observed, not formally tested                                           Pertinent Vitals/Pain Pain Assessment: No/denies pain    Home Living Family/patient expects to be discharged to:: Private residence Living Arrangements:  Children Available Help at Discharge: Family Type of Home: Mobile home Home Access: Stairs to enter Entrance Stairs-Rails: Right;Left;Can reach both Entrance Stairs-Number of Steps: 4 Home Layout: One level Home Equipment: Cane - single point;Walker - 4 wheels      Prior Function Level of Independence: Independent         Comments: Hydrographic surveyor, drives     Hand Dominance   Dominant Hand: Right    Extremity/Trunk Assessment   Upper Extremity Assessment Upper Extremity Assessment: Defer to OT evaluation    Lower Extremity Assessment Lower Extremity Assessment: Overall WFL for tasks assessed    Cervical / Trunk Assessment Cervical / Trunk Assessment: Normal  Communication   Communication: No difficulties  Cognition Arousal/Alertness: Awake/alert Behavior During Therapy: WFL for tasks assessed/performed Overall Cognitive Status: Within Functional Limits for tasks assessed                                        General Comments      Exercises     Assessment/Plan    PT Assessment Patent does not need any further PT services  PT Problem List         PT Treatment Interventions      PT Goals (Current goals can be found in the Care Plan section)  Acute Rehab PT Goals Patient Stated Goal: return home with family to assist PT Goal Formulation: With patient Time For Goal Achievement: 03/29/20 Potential to Achieve Goals: Good    Frequency     Barriers to discharge        Co-evaluation PT/OT/SLP Co-Evaluation/Treatment: Yes Reason for Co-Treatment: Complexity of the patient's impairments (multi-system involvement) PT goals addressed during session: Mobility/safety with mobility OT goals addressed during session: ADL's and self-care       AM-PAC PT "6 Clicks" Mobility  Outcome Measure Help needed turning from your back to your side while in a flat bed without using bedrails?: None Help needed moving from lying on your back to  sitting on the side of a flat bed without using bedrails?: None Help needed moving to and from a bed to a chair (including a wheelchair)?: None Help needed standing up from a chair using your arms (e.g., wheelchair or bedside chair)?: None Help needed to walk in hospital room?: A Little Help needed climbing 3-5 steps with a railing? : A Little 6 Click Score: 22    End of Session Equipment Utilized During Treatment: Oxygen Activity Tolerance: Patient tolerated treatment well;Patient limited by fatigue Patient left: in chair;with call bell/phone within reach Nurse Communication: Mobility status PT Visit Diagnosis: Unsteadiness on feet (R26.81);Other abnormalities of gait and mobility (R26.89);Muscle weakness (generalized) (M62.81)    Time: 9417-4081 PT Time Calculation (min) (ACUTE ONLY): 23 min   Charges:   PT Evaluation $PT Eval Moderate Complexity: 1 Mod PT Treatments $Therapeutic Activity: 23-37 mins  12:40 PM, 03/29/20 Lonell Grandchild, MPT Physical Therapist with [facility] [phone] office [phone] mobile phone

## 2020-03-29 NOTE — Evaluation (Signed)
Occupational Therapy Evaluation Patient Details Name: Erica [name] MRN: [id number] DOB: [birth year] Today's Date: 03/29/2020    History of Present Illness Erica [name]  is a 69 y.o. female, with history of stroke, HTN, HLD, DMII, COPD, CHF and more presents to the ED with dyspnea. Patient reports onset was yesterday morning. Patient reports that it has been progressively worse. The dyspnea is worse on exertion, and better with rest. She reports orthopnea at home and palpitations. She has no chest pain, diaphoresis, or nausea. She has no history of MI. She is a current smoker. She has associated swelling. Patient reports cough that is productive of thick yellow sputum. She has not had any fevers or body aches. No ill contacts. Patient reports decrease in urine output. Her last void was the AM of 03/27/20. It was non painful, non bloody, and there was not malodorous. Patient reports that she has been constipated. She has tried to take linzess, without relief.  Erica [name]  is a 70 y.o. female, with history of stroke, HTN, HLD, DMII, COPD, CHF and more presents to the ED with dyspnea. Patient reports onset was yesterday morning. Patient reports that it has been progressively worse. The dyspnea is worse on exertion, and better with rest. She reports orthopnea at home and palpitations. She has no chest pain, diaphoresis, or nausea. She has no history of MI. She is a current smoker. She has associated swelling. Patient reports cough that is productive of thick yellow sputum. She has not had any fevers or body aches. No ill contacts. Patient reports decrease in urine output. Her last void was the AM of 03/27/20. It was non painful, non bloody, and there was not malodorous. Patient reports that she has been constipated. She has tried to take linzess, without relief.   Clinical Impression   Patient evaluated by Occupational Therapy with no further acute OT needs identified. All education has been completed and  the patient has no further questions. See below for any follow-up Occupational Therapy or equipment needs. OT is signing off. Thank you for this referral.      Follow Up Recommendations  No OT follow up    Equipment Recommendations       Recommendations for Other Services       Precautions / Restrictions Precautions Precautions: Fall Restrictions Weight Bearing Restrictions: No      Mobility Bed Mobility Overal bed mobility: Independent                Transfers Overall transfer level: Independent Equipment used: None             General transfer comment: completed bed mobility, transfer from bed to chair and walked around ICU with close supervision to independence - while ambulating slight LOB x 2 due to loose slippers, not patient's functional level.  recommended different shoes and or slipper socks, which patient then donned independently.    Balance Overall balance assessment: Independent                                         ADL either performed or assessed with clinical judgement   ADL                                         General ADL Comments: Patient donned  slippers and slipper socks independently this date.  Patient able to wash face and straighten her gown     Vision Baseline Vision/History: No visual deficits Patient Visual Report: No change from baseline Vision Assessment?: Yes Eye Alignment: Within Functional Limits Ocular Range of Motion: Within Functional Limits Alignment/Gaze Preference: Within Defined Limits     Perception     Praxis      Pertinent Vitals/Pain Pain Assessment: No/denies pain     Hand Dominance Right   Extremity/Trunk Assessment Upper Extremity Assessment Upper Extremity Assessment: Overall WFL for tasks assessed   Lower Extremity Assessment Lower Extremity Assessment: Defer to PT evaluation   Cervical / Trunk Assessment Cervical / Trunk Assessment: Normal    Communication Communication Communication: No difficulties   Cognition Arousal/Alertness: Awake/alert Behavior During Therapy: WFL for tasks assessed/performed Overall Cognitive Status: Within Functional Limits for tasks assessed                                     General Comments       Exercises     Shoulder Instructions      Home Living Family/patient expects to be discharged to:: Private residence Living Arrangements: Children Available Help at Discharge: Family Type of Home: Mobile home Home Access: Stairs to enter Technical brewer of Steps: 4 Entrance Stairs-Rails: Right;Left;Can reach both Home Layout: One level     Bathroom Shower/Tub: Teacher, early years/pre: Standard Bathroom Accessibility: Yes How Accessible: Accessible via walker Home Equipment: Gaston - 2 wheels;Cane - single point          Prior Functioning/Environment Level of Independence: Independent        Comments: independent with all B/IADLs, driving, errands etc        OT Problem List: Decreased activity tolerance      OT Treatment/Interventions:      OT Goals(Current goals can be found in the care plan section)    OT Frequency:     Barriers to D/C:            Co-evaluation PT/OT/SLP Co-Evaluation/Treatment: Yes Reason for Co-Treatment: To address functional/ADL transfers PT goals addressed during session: Mobility/safety with mobility OT goals addressed during session: ADL's and self-care      AM-PAC OT "6 Clicks" Daily Activity     Outcome Measure Help from another person eating meals?: None Help from another person taking care of personal grooming?: None Help from another person toileting, which includes using toliet, bedpan, or urinal?: None Help from another person bathing (including washing, rinsing, drying)?: None Help from another person to put on and taking off regular upper body clothing?: None Help from another person to put on  and taking off regular lower body clothing?: None 6 Click Score: 24   End of Session Equipment Utilized During Treatment: Oxygen  Activity Tolerance: Patient tolerated treatment well Patient left: in chair;with call bell/phone within reach  OT Visit Diagnosis: Unsteadiness on feet (R26.81)                Time: 1030-1045 OT Time Calculation (min): 15 min Charges:  OT General Charges $OT Visit: 1 Visit OT Evaluation $OT Eval Low Complexity: 1 Low  Vangie Bicker, [place], OTR/L [phone]   Arbutus Ped 03/29/2020, 10:58 AM

## 2020-03-29 NOTE — Progress Notes (Signed)
PROGRESS NOTE    Erica [name]  TIR:[id number] DOB: Jun 07, 1951 DOA: 03/27/2020 PCP: Iona Beard, MD   Brief Narrative:  Per HPI: Erica [name] a69 y.o.female,with history of stroke, HTN, HLD, DMII, COPD, CHF and more presents to the ED with dyspnea. Patient reports onset was yesterday morning. Patient reports that it has been progressively worse. The dyspnea is worse on exertion, and better with rest. She reports orthopnea at home and palpitations. She has no chest pain, diaphoresis, or nausea. She has no history of MI. She is a current smoker. She has associated swelling. Patient reports cough that is productive of thick yellow sputum. She has not had any fevers or body aches. No ill contacts. Patient reports decrease in urine output. Her last void was the AM of 03/27/20. It was non painful, non bloody, and there was not malodorous. Patient reports that she has been constipated. She has tried to take linzess, without relief.   Assessment & Plan:   Active Problems:   Acute respiratory failure with hypoxia (HCC)   Acute combined hypercapnic and hypoxemic respiratory failure-multifactorial-improving -Appears to be related to some diastolic CHF exacerbation as well as COPD, but predominantly more COPD exacerbation -Plan to schedule breathing treatments and continue IV steroids and add Pulmicort -Up in chair with flutter valve and Mucinex -Patient overall improving with diuresis, okay for transfer to telemetry  Acute diastolic CHF exacerbation-improving -Monitor daily weights and I's and O's -Wean oxygen -2D echocardiogram on 7/4 with severe RV dysfunction and moderate pulmonary hypertension noted with LVEF 60-65%.  Hypokinesis noted in left ventricle as well.  May require invasive testing given these findings.  Plan to discuss with cardiology in a.m. -Lasix 80 mg IV is ordered per nephrology  Acute COPD exacerbation-improved -Wears chronic 3-4 L nasal cannula oxygen at  home -Wean IV steroids -Likely with some component of chronic hypercapnia as well -Continue azithromycin as well as breathing treatments and steroids -Wean BiPAP -Trial of mucolytic's as well as up in chair and flutter valve  History of hypertension  -Hypotension resolved -Hold home Coreg and ARB for now until done with aggressive IV diuresis -Stable for transfer to telemetry  Chronic tobacco use -Nicotine patch -Counseled on cessation  Troponin elevation -No chest pain or signs of ACS noted -Monitor on telemetry -Likely demand ischemia -Significant findings as noted above on 2D echocardiogram and will discuss with cardiology tomorrow  Stage IV CKD  -Appreciate nephrology assistance  with diuresis -Plan for repeat IV Lasix 80 mg today   DVT prophylaxis: Heparin Code Status: Full code Family Communication: None at bedside Disposition Plan:  Transfer to telemetry 7/5  Status is: Inpatient  Remains inpatient appropriate because:IV treatments appropriate due to intensity of illness or inability to take PO and Inpatient level of care appropriate due to severity of illness   Dispo: The patient is from: Home  Anticipated d/c is to: Home  Anticipated d/c date is: 1-2 days  Patient currently is not medically stable to d/c.  Consultants:   Nephrology  Procedures:   See below  Antimicrobials:  Anti-infectives (From admission, onward)   Start     Dose/Rate Route Frequency Ordered Stop   03/28/20 2300  azithromycin (ZITHROMAX) tablet 500 mg     Discontinue    "Followed by" Linked Group Details   500 mg Oral Daily 03/27/20 2254 04/01/20 2159   03/27/20 2254  azithromycin (ZITHROMAX) 500 mg in sodium chloride 0.9 % 250 mL IVPB       "Followed by"  Linked Group Details   500 mg 250 mL/hr over 60 Minutes Intravenous Every 24 hours 03/27/20 2254 03/28/20 0044       Subjective: Patient seen and evaluated today with no new  acute complaints or concerns. No acute concerns or events noted overnight.  She is overall feeling much better and denies any shortness of breath or chest pain this morning.  Nasal cannula is being weaned.  She is slept well overnight.  Objective: Vitals:   03/29/20 0534 03/29/20 0700 03/29/20 0755 03/29/20 0910  BP: (!) 110/50 (!) 124/52    Pulse: 72 80    Resp: (!) 23 20    Temp:   98.1 F (36.7 C)   TempSrc:   Oral   SpO2: 95% 96%  96%  Weight:      Height:        Intake/Output Summary (Last 24 hours) at 03/29/2020 1034 Last data filed at 03/29/2020 0700 Gross per 24 hour  Intake 420.56 ml  Output 1360 ml  Net -939.44 ml   Filed Weights   03/28/20 0037 03/28/20 0500 03/29/20 0500  Weight: 63.5 kg 64.2 kg 64.6 kg    Examination:  General exam: Appears calm and comfortable  Respiratory system: Clear to auscultation. Respiratory effort normal.  Currently on 5 L nasal cannula oxygen. Cardiovascular system: S1 & S2 heard, RRR. No JVD, murmurs, rubs, gallops or clicks. No pedal edema. Gastrointestinal system: Abdomen is nondistended, soft and nontender. No organomegaly or masses felt. Normal bowel sounds heard. Central nervous system: Alert and oriented. No focal neurological deficits. Extremities: Symmetric 5 x 5 power. Skin: No rashes, lesions or ulcers Psychiatry: Judgement and insight appear normal. Mood & affect appropriate.     Data Reviewed: I have personally reviewed following labs and imaging studies  CBC: Recent Labs  Lab 03/27/20 1920 03/28/20 0505 03/29/20 0426  WBC 5.4 4.7 5.7  NEUTROABS 4.1 4.4  --   HGB 12.4 11.4* 11.7*  HCT 41.5 38.3 38.8  MCV 89.8 89.1 88.6  PLT 144* 124* 338   Basic Metabolic Panel: Recent Labs  Lab 03/27/20 1920 03/28/20 0505 03/29/20 0426  NA 142 143 142  K 3.9 4.3 5.1  CL 98 99 97*  CO2 32 31 31  GLUCOSE 100* 156* 152*  BUN 45* 45* 58*  CREATININE 2.51* 2.30* 2.34*  CALCIUM 7.9* 7.8* 8.4*  MG  --  1.7 1.9    GFR: Estimated Creatinine Clearance: 19.3 mL/min (A) (by C-G formula based on SCr of 2.34 mg/dL (H)). Liver Function Tests: Recent Labs  Lab 03/28/20 0505  AST 39  ALT 29  ALKPHOS 132*  BILITOT 0.7  PROT 5.9*  ALBUMIN 3.3*   No results for input(s): LIPASE, AMYLASE in the last 168 hours. No results for input(s): AMMONIA in the last 168 hours. Coagulation Profile: No results for input(s): INR, PROTIME in the last 168 hours. Cardiac Enzymes: No results for input(s): CKTOTAL, CKMB, CKMBINDEX, TROPONINI in the last 168 hours. BNP (last 3 results) No results for input(s): PROBNP in the last 8760 hours. HbA1C: Recent Labs    03/28/20 0752  HGBA1C 5.1   CBG: Recent Labs  Lab 03/28/20 1610 03/28/20 2007 03/29/20 0007 03/29/20 0425 03/29/20 0728  GLUCAP 144* 185* 109* 140* 118*   Lipid Profile: No results for input(s): CHOL, HDL, LDLCALC, TRIG, CHOLHDL, LDLDIRECT in the last 72 hours. Thyroid Function Tests: Recent Labs    03/28/20 0505  TSH 0.551   Anemia Panel: No results for input(s): VITAMINB12,  FOLATE, FERRITIN, TIBC, IRON, RETICCTPCT in the last 72 hours. Sepsis Labs: Recent Labs  Lab 03/28/20 0505 03/28/20 0752  LATICACIDVEN 0.8 1.0    Recent Results (from the past 240 hour(s))  SARS Coronavirus 2 by RT PCR (hospital order, performed in [facility] hospital lab) Nasopharyngeal Nasopharyngeal Swab     Status: None   Collection Time: 03/27/20 10:58 PM   Specimen: Nasopharyngeal Swab  Result Value Ref Range Status   SARS Coronavirus 2 NEGATIVE NEGATIVE Final    Comment: (NOTE) SARS-CoV-2 target nucleic acids are NOT DETECTED.  The SARS-CoV-2 RNA is generally detectable in upper and lower respiratory specimens during the acute phase of infection. The lowest concentration of SARS-CoV-2 viral copies this assay can detect is 250 copies / mL. A negative result does not preclude SARS-CoV-2 infection and should not be used as the sole basis for treatment or  other patient management decisions.  A negative result may occur with improper specimen collection / handling, submission of specimen other than nasopharyngeal swab, presence of viral mutation(s) within the areas targeted by this assay, and inadequate number of viral copies (<250 copies / mL). A negative result must be combined with clinical observations, patient history, and epidemiological information.  Fact Sheet for Patients:   StrictlyIdeas.no  Fact Sheet for Healthcare Providers: BankingDealers.co.za  This test is not yet approved or  cleared by the [place] FDA and has been authorized for detection and/or diagnosis of SARS-CoV-2 by FDA under an Emergency Use Authorization (EUA).  This EUA will remain in effect (meaning this test can be used) for the duration of the COVID-19 declaration under Section 564(b)(1) of the Act, 21 U.S.C. section 360bbb-3(b)(1), unless the authorization is terminated or revoked sooner.  Performed at [facility], [address], [place], [place] [address]   MRSA PCR Screening     Status: None   Collection Time: 03/28/20 12:21 AM   Specimen: Nasal Mucosa; Nasopharyngeal  Result Value Ref Range Status   MRSA by PCR NEGATIVE NEGATIVE Final    Comment:        The GeneXpert MRSA Assay (FDA approved for NASAL specimens only), is one component of a comprehensive MRSA colonization surveillance program. It is not intended to diagnose MRSA infection nor to guide or monitor treatment for MRSA infections. Performed at [facility], [address] [place], [place] [address]          Radiology Studies: DG CHEST PORT 1 VIEW  Result Date: 03/28/2020 CLINICAL DATA:  69 year old female with history of COPD exacerbation. EXAM: PORTABLE CHEST 1 VIEW COMPARISON:  Chest x-ray 03/27/2020. FINDINGS: Right-sided calcified pleural plaques are again noted. Lung volumes are slightly low with widespread areas of  interstitial prominence and scattered areas of bronchiectasis, similar to prior studies, compatible with underlying interstitial lung disease. Small bilateral pleural effusions. No pneumothorax. No evidence of pulmonary edema. Heart size is mildly enlarged. Upper mediastinal contours are within normal limits. Aortic atherosclerosis. IMPRESSION: 1. Small bilateral pleural effusions. 2. Mild cardiomegaly. 3. Chronic interstitial lung disease, similar to prior studies. 4. Aortic atherosclerosis. Electronically Signed   By: Vinnie Langton M.D.   On: 03/28/2020 08:07   DG Chest Portable 1 View  Result Date: 03/27/2020 CLINICAL DATA:  Shortness of breath, suspected COPD exacerbation EXAM: PORTABLE CHEST 1 VIEW COMPARISON:  02/18/2020 FINDINGS: Cardiomediastinal contours are enlarged. Blunting of RIGHT and LEFT costodiaphragmatic sulcus. Perhaps slightly increased when compared to the prior study. Increased interstitial markings similar to the prior exam. No lobar consolidation.  Visualized skeletal structures on limited assessment without acute process. IMPRESSION: Slight increase in bilateral effusions may indicate heart failure in this patient with marked cardiomegaly. Background interstitial changes are similar and there is chronic interstitial thickening. Superimposed edema is possible though there is no change in this appearance from the previous radiograph. Electronically Signed   By: Zetta Bills M.D.   On: 03/27/2020 20:10   ECHOCARDIOGRAM COMPLETE  Result Date: 03/28/2020    ECHOCARDIOGRAM REPORT   Patient Name:   NAVINA WOHLERS Date of Exam: 03/28/2020 Medical Rec #:  [id number]        Height:       64.0 in Accession #:    [id number]       Weight:       141.5 lb Date of Birth:  [birth year]        BSA:          1.689 m Patient Age:    58 years         BP:           97/47 mmHg Patient Gender: F                HR:           88 bpm. Exam Location:  [facility] Procedure: 2D Echo, Cardiac Doppler and Color  Doppler Indications:    CHF-Acute Diastolic 585.27 / P82.42  History:        Patient has prior history of Echocardiogram examinations, most                 recent 09/04/2018. CHF, COPD; Risk Factors:Diabetes,                 Hypertension and Dyslipidemia. Acute respiratory failure with                 hypoxia,Tobacco abuse.  Sonographer:    Alvino Chapel RCS Referring Phys: [id number] ASIA B [place]  Sonographer Comments: Patient BiPAP at time of echocardiogram IMPRESSIONS  1. Hypokinesis of the basal inferior wall with overall normal LV systolic function; mild LVH; grade 1 diastolic dsyfunction; D shaped septum suggestive of pulmonary hypertension; probable prolapse of anterior MV leaflet with mild MR; moderate RAE and RVE with severe RV dysfunction; moderate pulmonary hypertension.  2. Left ventricular ejection fraction, by estimation, is 60 to 65%. The left ventricle has normal function. The left ventricle demonstrates regional wall motion abnormalities (see scoring diagram/findings for description). There is mild left ventricular  hypertrophy. Left ventricular diastolic parameters are consistent with Grade I diastolic dysfunction (impaired relaxation).  3. Right ventricular systolic function is severely reduced. The right ventricular size is moderately enlarged. There is moderately elevated pulmonary artery systolic pressure.  4. Right atrial size was moderately dilated.  5. The mitral valve is normal in structure. Mild mitral valve regurgitation. No evidence of mitral stenosis.  6. The aortic valve is tricuspid. Aortic valve regurgitation is not visualized. No aortic stenosis is present.  7. The inferior vena cava is dilated in size with <50% respiratory variability, suggesting right atrial pressure of 15 mmHg. FINDINGS  Left Ventricle: Left ventricular ejection fraction, by estimation, is 60 to 65%. The left ventricle has normal function. The left ventricle demonstrates regional wall motion abnormalities.  The left ventricular internal cavity size was normal in size. There is mild left ventricular hypertrophy. Left ventricular diastolic parameters are consistent with Grade I diastolic dysfunction (impaired relaxation). Right Ventricle: The right ventricular size is moderately enlarged.Right ventricular systolic  function is severely reduced. There is moderately elevated pulmonary artery systolic pressure. The tricuspid regurgitant velocity is 3.19 m/s, and with an assumed right atrial pressure of 15 mmHg, the estimated right ventricular systolic pressure is 72.0 mmHg. Left Atrium: Left atrial size was normal in size. Right Atrium: Right atrial size was moderately dilated. Pericardium: Trivial pericardial effusion is present. Mitral Valve: The mitral valve is normal in structure. There is mild prolapse of anterior leaflet of the mitral valve. Normal mobility of the mitral valve leaflets. Mild mitral valve regurgitation. No evidence of mitral valve stenosis. Tricuspid Valve: The tricuspid valve is normal in structure. Tricuspid valve regurgitation is mild . No evidence of tricuspid stenosis. Aortic Valve: The aortic valve is tricuspid. Aortic valve regurgitation is not visualized. No aortic stenosis is present. Pulmonic Valve: The pulmonic valve was normal in structure. Pulmonic valve regurgitation is mild. No evidence of pulmonic stenosis. Aorta: The aortic root is normal in size and structure. Venous: The inferior vena cava is dilated in size with less than 50% respiratory variability, suggesting right atrial pressure of 15 mmHg. IAS/Shunts: There is left bowing of the interatrial septum, suggestive of elevated right atrial pressure. No atrial level shunt detected by color flow Doppler. Additional Comments: Hypokinesis of the basal inferior wall with overall normal LV systolic function; mild LVH; grade 1 diastolic dsyfunction; D shaped septum suggestive of pulmonary hypertension; probable prolapse of anterior MV  leaflet with mild MR; moderate RAE and RVE with severe RV dysfunction; moderate pulmonary hypertension.  LEFT VENTRICLE PLAX 2D LVIDd:         4.38 cm  Diastology LVIDs:         2.32 cm  LV e' lateral:   10.20 cm/s LV PW:         1.25 cm  LV E/e' lateral: 9.6 LV IVS:        1.18 cm  LV e' medial:    4.79 cm/s LVOT diam:     1.90 cm  LV E/e' medial:  20.4 LV SV:         70 LV SV Index:   41 LVOT Area:     2.84 cm  RIGHT VENTRICLE RV S prime:     8.27 cm/s TAPSE (M-mode): 2.1 cm LEFT ATRIUM             Index       RIGHT ATRIUM           Index LA diam:        4.00 cm 2.37 cm/m  RA Area:     21.10 cm LA Vol (A2C):   46.6 ml 27.59 ml/m RA Volume:   71.00 ml  42.04 ml/m LA Vol (A4C):   31.0 ml 18.35 ml/m LA Biplane Vol: 37.4 ml 22.14 ml/m  AORTIC VALVE LVOT Vmax:   121.00 cm/s LVOT Vmean:  72.800 cm/s LVOT VTI:    0.247 m  AORTA Ao Root diam: 2.70 cm MITRAL VALVE                TRICUSPID VALVE MV Area (PHT): 3.19 cm     TR Peak grad:   40.7 mmHg MV Decel Time: 238 msec     TR Vmax:        319.00 cm/s MV E velocity: 97.50 cm/s MV A velocity: 113.00 cm/s  SHUNTS MV E/A ratio:  0.86         Systemic VTI:  0.25 m  Systemic Diam: 1.90 cm Kirk Ruths MD Electronically signed by Kirk Ruths MD Signature Date/Time: 03/28/2020/10:25:41 AM    Final         Scheduled Meds:  aspirin EC  162 mg Oral Daily   atorvastatin  80 mg Oral Daily   azithromycin  500 mg Oral Daily   budesonide (PULMICORT) nebulizer solution  0.25 mg Nebulization BID   chlorhexidine  15 mL Mouth Rinse BID   Chlorhexidine Gluconate Cloth  6 each Topical Daily   clopidogrel  75 mg Oral Daily   docusate sodium  100 mg Oral BID   famotidine  20 mg Oral Daily   ferrous sulfate  325 mg Oral BID   furosemide  80 mg Intravenous Once   guaiFENesin  600 mg Oral BID   heparin  5,000 Units Subcutaneous Q8H   insulin aspart  0-9 Units Subcutaneous Q4H   ipratropium-albuterol  3 mL Nebulization Q6H     mouth rinse  15 mL Mouth Rinse q12n4p   methylPREDNISolone (SOLU-MEDROL) injection  40 mg Intravenous Q12H   nicotine  14 mg Transdermal Daily   omega-3 acid ethyl esters  1 g Oral Daily   pantoprazole  40 mg Oral Daily   sodium chloride flush  3 mL Intravenous Q12H   umeclidinium bromide  1 puff Inhalation Daily   Continuous Infusions:  sodium chloride 10 mL/hr at 03/28/20 0630     LOS: 2 days    Time spent: 35 minutes    Shauntel Prest Darleen Crocker, DO [facility]  If 7PM-7AM, please contact night-coverage [email or web address] 03/29/2020, 10:34 AM

## 2020-03-29 NOTE — Progress Notes (Signed)
Initial Nutrition Assessment  RD working remotely.  DOCUMENTATION CODES:   Not applicable  INTERVENTION:   - Ensure Enlive po BID, each supplement provides 350 kcal and 20 grams of protein  - Pro-stat 30 ml po BID, each supplement provides 100 kcal and 15 grams of protein  - Encourage adequate PO intake  NUTRITION DIAGNOSIS:   Increased nutrient needs related to chronic illness (CHF, COPD) as evidenced by estimated needs.  GOAL:   Patient will meet greater than or equal to 90% of their needs  MONITOR:   PO intake, Supplement acceptance, Labs, Weight trends, I & O's  REASON FOR ASSESSMENT:   Consult COPD Protocol  ASSESSMENT:   69 year old female who presented on 7/03 with SOB and BLE swelling. PMH of CHF, COPD, T2DM, HLD, HTN, CKD stage IV. Admitted with acute respiratory failure with hypoxia and hypercapnia.   RD working remotely. Unable to reach pt via phone call.  Reviewed weight history in chart. Pt with a 7.4 kg weight loss since 05/23/19. This is a 10.3% weight loss in 10 months which is not significant for timeframe but is concerning given progressive nature of pt's weight loss. Pt with CHF diagnosis. Fluid shifts may be contributing to weight fluctuations. Per RN edema assessment, pt with mild pitting edema to RLE and non-pitting edema to LLE.  Meal completions have been 50%. RD will order oral nutrition supplements to aid pt in meeting kcal and protein needs during admission.  Medications reviewed and include: colace, pepcid, ferrous sulfate, SSI q 4 hours, solu-medrol, Lovaza, protonix  Labs reviewed. CBG's: 109-185 x 24 hours  UOP: 1435 ml x 24 hours  NUTRITION - FOCUSED PHYSICAL EXAM:  Unable to complete at this time. RD working remotely.  Diet Order:   Diet Order            Diet renal with fluid restriction Fluid restriction: 1200 mL Fluid; Room service appropriate? Yes; Fluid consistency: Thin  Diet effective now                 EDUCATION  NEEDS:   No education needs have been identified at this time  Skin:  Skin Assessment: Reviewed RN Assessment  Last BM:  03/26/20  Height:   Ht Readings from Last 1 Encounters:  03/28/20 5\' 4"  (1.626 m)    Weight:   Wt Readings from Last 1 Encounters:  03/29/20 64.6 kg    Ideal Body Weight:  54.5 kg  BMI:  Body mass index is 24.45 kg/m.  Estimated Nutritional Needs:   Kcal:  1600-1800  Protein:  80-95 grams  Fluid:  1.6-1.8 L    Gaynell Face, MS, RD, LDN Inpatient Clinical Dietitian Please see AMiON for contact information.

## 2020-03-30 ENCOUNTER — Encounter (HOSPITAL_COMMUNITY): Payer: Self-pay | Admitting: Family Medicine

## 2020-03-30 DIAGNOSIS — J849 Interstitial pulmonary disease, unspecified: Secondary | ICD-10-CM

## 2020-03-30 DIAGNOSIS — I272 Pulmonary hypertension, unspecified: Secondary | ICD-10-CM

## 2020-03-30 DIAGNOSIS — I50813 Acute on chronic right heart failure: Secondary | ICD-10-CM

## 2020-03-30 DIAGNOSIS — N184 Chronic kidney disease, stage 4 (severe): Secondary | ICD-10-CM

## 2020-03-30 DIAGNOSIS — I2584 Coronary atherosclerosis due to calcified coronary lesion: Secondary | ICD-10-CM

## 2020-03-30 DIAGNOSIS — I2510 Atherosclerotic heart disease of native coronary artery without angina pectoris: Secondary | ICD-10-CM

## 2020-03-30 LAB — BASIC METABOLIC PANEL
Anion gap: 13 (ref 5–15)
BUN: 80 mg/dL — ABNORMAL HIGH (ref 8–23)
CO2: 33 mmol/L — ABNORMAL HIGH (ref 22–32)
Calcium: 8.2 mg/dL — ABNORMAL LOW (ref 8.9–10.3)
Chloride: 94 mmol/L — ABNORMAL LOW (ref 98–111)
Creatinine, Ser: 2.45 mg/dL — ABNORMAL HIGH (ref 0.44–1.00)
GFR calc Af Amer: 22 mL/min — ABNORMAL LOW (ref >60)
GFR calc non Af Amer: 19 mL/min — ABNORMAL LOW (ref >60)
Glucose, Bld: 163 mg/dL — ABNORMAL HIGH (ref 70–99)
Potassium: 4.6 mmol/L (ref 3.5–5.1)
Sodium: 140 mmol/L (ref 135–145)

## 2020-03-30 LAB — CBC
HCT: 38.2 % (ref 36.0–46.0)
Hemoglobin: 11.6 g/dL — ABNORMAL LOW (ref 12.0–15.0)
MCH: 26.5 pg (ref 26.0–34.0)
MCHC: 30.4 g/dL (ref 30.0–36.0)
MCV: 87.4 fL (ref 80.0–100.0)
Platelets: 172 10*3/uL (ref 150–400)
RBC: 4.37 MIL/uL (ref 3.87–5.11)
RDW: 14.6 % (ref 11.5–15.5)
WBC: 9.5 10*3/uL (ref 4.0–10.5)
nRBC: 0 % (ref 0.0–0.2)

## 2020-03-30 LAB — MAGNESIUM: Magnesium: 2 mg/dL (ref 1.7–2.4)

## 2020-03-30 LAB — GLUCOSE, CAPILLARY
Glucose-Capillary: 108 mg/dL — ABNORMAL HIGH (ref 70–99)
Glucose-Capillary: 128 mg/dL — ABNORMAL HIGH (ref 70–99)
Glucose-Capillary: 169 mg/dL — ABNORMAL HIGH (ref 70–99)
Glucose-Capillary: 263 mg/dL — ABNORMAL HIGH (ref 70–99)

## 2020-03-30 MED ORDER — PRO-STAT SUGAR FREE PO LIQD: 30.0000 mL | Freq: Two times a day (BID) | ORAL | 0 refills | Status: DC

## 2020-03-30 MED ORDER — PREDNISONE 20 MG PO TABS: 40.0000 mg | ORAL_TABLET | Freq: Every day | ORAL | 0 refills | Status: AC

## 2020-03-30 MED ORDER — AZITHROMYCIN 250 MG PO TABS: 250.0000 mg | ORAL_TABLET | Freq: Every day | ORAL | 0 refills | Status: AC

## 2020-03-30 MED ORDER — ENSURE ENLIVE PO LIQD: 237.0000 mL | Freq: Two times a day (BID) | ORAL | 12 refills | Status: DC

## 2020-03-30 MED ORDER — GUAIFENESIN ER 600 MG PO TB12: 600.0000 mg | ORAL_TABLET | Freq: Two times a day (BID) | ORAL | 0 refills | Status: AC

## 2020-03-30 MED ORDER — IPRATROPIUM-ALBUTEROL 0.5-2.5 (3) MG/3ML IN SOLN
3.0000 mL | Freq: Three times a day (TID) | RESPIRATORY_TRACT | Status: DC
  Administered 2020-03-30: 3 mL via RESPIRATORY_TRACT
  Filled 2020-03-30: qty 3

## 2020-03-30 NOTE — Discharge Summary (Signed)
Physician Discharge Summary  Erica [name] JJK:[id number] DOB: 1951-03-29 DOA: 03/27/2020  PCP: Iona Beard, MD  Admit date: 03/27/2020  Discharge date: 03/30/2020  Admitted From:Home  Disposition:  Home  Recommendations for Outpatient Follow-up:  1. Follow up with PCP in 1-2 weeks 2. Please obtain BMP/CBC in one week 3. Continue to hold Coreg, Diovan, and Norvasc until seen in clinic and blood pressures are noted to improve 4. May resume torsemide starting 7/7 at 20 mg dose Monday Wednesday Friday as recommended by nephrology 5. Patient will be scheduled for outpatient nephrology visit 6. Patient will be scheduled for outpatient pulmonology visit due to concern for interstitial lung disease 7. Continue on prednisone 40 mg daily as scheduled for the next 5 days 8. Continue home breathing treatments as needed for any shortness of breath or wheezing 9. Remain on aspirin, Plavix, and statin follow-up with cardiologist Dr. Harl Bowie as requested in 2 weeks  Home Health: None  Equipment/Devices: Has home nasal cannula 3 L-chronic  Discharge Condition: Stable  CODE STATUS: Full  Diet recommendation: Heart Healthy/carb modified  Brief/Interim Summary: Per HPI: Erica a69 [name],with history of stroke, HTN, HLD, DMII, COPD, CHF and more presents to the ED with dyspnea. Patient reports onset was yesterday morning. Patient reports that it has been progressively worse. The dyspnea is worse on exertion, and better with rest. She reports orthopnea at home and palpitations. She has no chest pain, diaphoresis, or nausea. She has no history of MI. She is a current smoker. She has associated swelling. Patient reports cough that is productive of thick yellow sputum. She has not had any fevers or body aches. No ill contacts. Patient reports decrease in urine output. Her last void was the AM of 03/27/20. It was non painful, non bloody, and there was not malodorous. Patient reports that she  has been constipated. She has tried to take linzess, without relief.  Acute combined hypercapnic and hypoxemic respiratory failure-multifactorial-resolved -Appears to be related to some diastolic CHF exacerbation as well as COPD, but predominantly more COPD exacerbation  Acute diastolic CHF exacerbation-resolved -Back to baseline level of oxygen requirement -2D echocardiogram on 7/4 with severe RV dysfunction and moderate pulmonary hypertension noted with LVEF 60-65%.  Hypokinesis noted in left ventricle as well.  May require invasive testing given these findings.   -We will follow-up with cardiology in outpatient setting -Cardiology recommends follow-up with pulmonology due to suspicion of interstitial lung disease with no recommendations for invasive cardiac testing at this time in light of CKD stage IV and high risk of contrast nephropathy -May require right heart catheterization in the near future to assist with management of pulmonary hypertension  Acute COPD exacerbation-improved -Wears chronic 3-4 L nasal cannula oxygen at home -Continue on azithromycin for 1 more day as prescribed to complete 5-day course of treatment -Continue on prednisone 40 mg daily for next 5 days -Continue home breathing treatments as needed -We will schedule with pulmonology in [place] for further follow-up, also with concern for interstitial lung disease  History of hypertension  -Currently with some lower blood pressure readings after diuresis -Stable for discharge and will hold home Coreg, Diovan, Norvasc for now -Follow-up with PCP and reassess blood pressure readings and resume medications as appropriate  Chronic tobacco use -Nicotine patch declined on discharge -Counseled on cessation  Troponin elevation -No chest pain or signs of ACS noted -Monitor on telemetry -Likely demand ischemia -Significant findings as noted above on 2D echocardiogram with cardiology recommendations as noted to  follow-up with pulmonology and consider right heart catheterization in the near future  Stage IV CKD  -Appreciate nephrology assistance with diuresis -Nephrology will schedule for outpatient follow-up visit -Continue torsemide 20 mg on Monday Wednesday and Friday to resume on 7/7  Discharge Diagnoses:  Active Problems:   Acute respiratory failure with hypoxia [facility])    Discharge Instructions  Discharge Instructions    Ambulatory referral to Nephrology   Complete by: As directed    Ambulatory referral to Pulmonology   Complete by: As directed    Reason for referral: Interstitial Lung Disease(ILD)   Diet - low sodium heart healthy   Complete by: As directed    Increase activity slowly   Complete by: As directed      Allergies as of 03/30/2020   No Known Allergies     Medication List    STOP taking these medications   amLODipine 10 MG tablet Commonly known as: NORVASC   carvedilol 12.5 MG tablet Commonly known as: COREG   cefdinir 300 MG capsule Commonly known as: OMNICEF   doxycycline 100 MG capsule Commonly known as: VIBRAMYCIN   valsartan 160 MG tablet Commonly known as: DIOVAN     TAKE these medications   albuterol 108 (90 Base) MCG/ACT inhaler Commonly known as: VENTOLIN HFA Inhale 1 puff into the lungs every 6 (six) hours as needed for shortness of breath.   aspirin EC 81 MG tablet Take 162 mg by mouth daily.   atorvastatin 80 MG tablet Commonly known as: LIPITOR Take 80 mg by mouth daily.   azithromycin 250 MG tablet Commonly known as: ZITHROMAX Take 1 tablet (250 mg total) by mouth daily for 1 day.   clopidogrel 75 MG tablet Commonly known as: PLAVIX Take 75 mg by mouth daily.   famotidine 20 MG tablet Commonly known as: PEPCID Take 20 mg by mouth daily.   feeding supplement (ENSURE ENLIVE) Liqd Take 237 mLs by mouth 2 (two) times daily between meals.   feeding supplement (PRO-STAT SUGAR FREE 64) Liqd Take 30 mLs by mouth 2 (two) times  daily.   ferrous sulfate 325 (65 FE) MG EC tablet Take 1 tablet (325 mg total) by mouth 2 (two) times daily.   guaiFENesin 600 MG 12 hr tablet Commonly known as: MUCINEX Take 1 tablet (600 mg total) by mouth 2 (two) times daily for 10 days.   ipratropium-albuterol 0.5-2.5 (3) MG/3ML Soln Commonly known as: DUONEB Take 3 mLs by nebulization every 6 (six) hours as needed (for shortness of breath).   Linzess 145 MCG Caps capsule Generic drug: linaclotide Take 145 mcg by mouth daily as needed (for constipation).   omega-3 acid ethyl esters 1 g capsule Commonly known as: LOVAZA Take 1 g by mouth daily.   pantoprazole 40 MG tablet Commonly known as: PROTONIX Take 1 tablet (40 mg total) by mouth daily.   predniSONE 20 MG tablet Commonly known as: Deltasone Take 2 tablets (40 mg total) by mouth daily for 5 days.   Spiriva HandiHaler 18 MCG inhalation capsule Generic drug: tiotropium Take 1 puff by mouth daily.   Symbicort 80-4.5 MCG/ACT inhaler Generic drug: budesonide-formoterol Inhale 2 puffs into the lungs 2 (two) times daily.   torsemide 20 MG tablet Commonly known as: DEMADEX Take 1 tablet (20 mg total) by mouth daily. Take 1 tablet on Monday, Wednesday, and Friday only.       Follow-up Information    Iona Beard, MD Follow up in 1 week(s).   Specialty: Family  Medicine Contact information: [place] [address] [place] [address] [phone]        Arnoldo Lenis, MD Follow up in 2 week(s).   Specialty: Cardiology Contact information: [address] [place] [address] [phone]        [place] Follow up in 2 week(s).   Contact information: [place] [address] [phone]        [facility] Follow up in 1 week(s).   Specialty: Pulmonology Contact information: [address] [place] [address] [phone]             No Known  Allergies  Consultations:  Cardiology  Nephrology   Procedures/Studies: DG CHEST PORT 1 VIEW  Result Date: 03/28/2020 CLINICAL DATA:  69 year old female with history of COPD exacerbation. EXAM: PORTABLE CHEST 1 VIEW COMPARISON:  Chest x-ray 03/27/2020. FINDINGS: Right-sided calcified pleural plaques are again noted. Lung volumes are slightly low with widespread areas of interstitial prominence and scattered areas of bronchiectasis, similar to prior studies, compatible with underlying interstitial lung disease. Small bilateral pleural effusions. No pneumothorax. No evidence of pulmonary edema. Heart size is mildly enlarged. Upper mediastinal contours are within normal limits. Aortic atherosclerosis. IMPRESSION: 1. Small bilateral pleural effusions. 2. Mild cardiomegaly. 3. Chronic interstitial lung disease, similar to prior studies. 4. Aortic atherosclerosis. Electronically Signed   By: Vinnie Langton M.D.   On: 03/28/2020 08:07   DG Chest Portable 1 View  Result Date: 03/27/2020 CLINICAL DATA:  Shortness of breath, suspected COPD exacerbation EXAM: PORTABLE CHEST 1 VIEW COMPARISON:  02/18/2020 FINDINGS: Cardiomediastinal contours are enlarged. Blunting of RIGHT and LEFT costodiaphragmatic sulcus. Perhaps slightly increased when compared to the prior study. Increased interstitial markings similar to the prior exam. No lobar consolidation. Visualized skeletal structures on limited assessment without acute process. IMPRESSION: Slight increase in bilateral effusions may indicate heart failure in this patient with marked cardiomegaly. Background interstitial changes are similar and there is chronic interstitial thickening. Superimposed edema is possible though there is no change in this appearance from the previous radiograph. Electronically Signed   By: Zetta Bills M.D.   On: 03/27/2020 20:10   ECHOCARDIOGRAM COMPLETE  Result Date: 03/28/2020    ECHOCARDIOGRAM REPORT   Patient Name:   EMMARAE COWDERY Date of Exam: 03/28/2020 Medical Rec #:  [id number]        Height:       64.0 in Accession #:    [id number]       Weight:       141.5 lb Date of Birth:  1951/07/21        BSA:          1.689 m Patient Age:    1 years         BP:           97/47 mmHg Patient Gender: F                HR:           88 bpm. Exam Location:  [facility] Procedure: 2D Echo, Cardiac Doppler and Color Doppler Indications:    CHF-Acute Diastolic 591.63 / W46.65  History:        Patient has prior history of Echocardiogram examinations, most                 recent 09/04/2018. CHF, COPD; Risk Factors:Diabetes,  Hypertension and Dyslipidemia. Acute respiratory failure with                 hypoxia,Tobacco abuse.  Sonographer:    Alvino Chapel RCS Referring Phys: [id number] ASIA B [place]  Sonographer Comments: Patient BiPAP at time of echocardiogram IMPRESSIONS  1. Hypokinesis of the basal inferior wall with overall normal LV systolic function; mild LVH; grade 1 diastolic dsyfunction; D shaped septum suggestive of pulmonary hypertension; probable prolapse of anterior MV leaflet with mild MR; moderate RAE and RVE with severe RV dysfunction; moderate pulmonary hypertension.  2. Left ventricular ejection fraction, by estimation, is 60 to 65%. The left ventricle has normal function. The left ventricle demonstrates regional wall motion abnormalities (see scoring diagram/findings for description). There is mild left ventricular  hypertrophy. Left ventricular diastolic parameters are consistent with Grade I diastolic dysfunction (impaired relaxation).  3. Right ventricular systolic function is severely reduced. The right ventricular size is moderately enlarged. There is moderately elevated pulmonary artery systolic pressure.  4. Right atrial size was moderately dilated.  5. The mitral valve is normal in structure. Mild mitral valve regurgitation. No evidence of mitral stenosis.  6. The aortic valve is tricuspid. Aortic valve  regurgitation is not visualized. No aortic stenosis is present.  7. The inferior vena cava is dilated in size with <50% respiratory variability, suggesting right atrial pressure of 15 mmHg. FINDINGS  Left Ventricle: Left ventricular ejection fraction, by estimation, is 60 to 65%. The left ventricle has normal function. The left ventricle demonstrates regional wall motion abnormalities. The left ventricular internal cavity size was normal in size. There is mild left ventricular hypertrophy. Left ventricular diastolic parameters are consistent with Grade I diastolic dysfunction (impaired relaxation). Right Ventricle: The right ventricular size is moderately enlarged.Right ventricular systolic function is severely reduced. There is moderately elevated pulmonary artery systolic pressure. The tricuspid regurgitant velocity is 3.19 m/s, and with an assumed right atrial pressure of 15 mmHg, the estimated right ventricular systolic pressure is 16.9 mmHg. Left Atrium: Left atrial size was normal in size. Right Atrium: Right atrial size was moderately dilated. Pericardium: Trivial pericardial effusion is present. Mitral Valve: The mitral valve is normal in structure. There is mild prolapse of anterior leaflet of the mitral valve. Normal mobility of the mitral valve leaflets. Mild mitral valve regurgitation. No evidence of mitral valve stenosis. Tricuspid Valve: The tricuspid valve is normal in structure. Tricuspid valve regurgitation is mild . No evidence of tricuspid stenosis. Aortic Valve: The aortic valve is tricuspid. Aortic valve regurgitation is not visualized. No aortic stenosis is present. Pulmonic Valve: The pulmonic valve was normal in structure. Pulmonic valve regurgitation is mild. No evidence of pulmonic stenosis. Aorta: The aortic root is normal in size and structure. Venous: The inferior vena cava is dilated in size with less than 50% respiratory variability, suggesting right atrial pressure of 15 mmHg.  IAS/Shunts: There is left bowing of the interatrial septum, suggestive of elevated right atrial pressure. No atrial level shunt detected by color flow Doppler. Additional Comments: Hypokinesis of the basal inferior wall with overall normal LV systolic function; mild LVH; grade 1 diastolic dsyfunction; D shaped septum suggestive of pulmonary hypertension; probable prolapse of anterior MV leaflet with mild MR; moderate RAE and RVE with severe RV dysfunction; moderate pulmonary hypertension.  LEFT VENTRICLE PLAX 2D LVIDd:         4.38 cm  Diastology LVIDs:         2.32 cm  LV e' lateral:   10.20  cm/s LV PW:         1.25 cm  LV E/e' lateral: 9.6 LV IVS:        1.18 cm  LV e' medial:    4.79 cm/s LVOT diam:     1.90 cm  LV E/e' medial:  20.4 LV SV:         70 LV SV Index:   41 LVOT Area:     2.84 cm  RIGHT VENTRICLE RV S prime:     8.27 cm/s TAPSE (M-mode): 2.1 cm LEFT ATRIUM             Index       RIGHT ATRIUM           Index LA diam:        4.00 cm 2.37 cm/m  RA Area:     21.10 cm LA Vol (A2C):   46.6 ml 27.59 ml/m RA Volume:   71.00 ml  42.04 ml/m LA Vol (A4C):   31.0 ml 18.35 ml/m LA Biplane Vol: 37.4 ml 22.14 ml/m  AORTIC VALVE LVOT Vmax:   121.00 cm/s LVOT Vmean:  72.800 cm/s LVOT VTI:    0.247 m  AORTA Ao Root diam: 2.70 cm MITRAL VALVE                TRICUSPID VALVE MV Area (PHT): 3.19 cm     TR Peak grad:   40.7 mmHg MV Decel Time: 238 msec     TR Vmax:        319.00 cm/s MV E velocity: 97.50 cm/s MV A velocity: 113.00 cm/s  SHUNTS MV E/A ratio:  0.86         Systemic VTI:  0.25 m                             Systemic Diam: 1.90 cm Kirk Ruths MD Electronically signed by Kirk Ruths MD Signature Date/Time: 03/28/2020/10:25:41 AM    Final      Discharge Exam: Vitals:   03/30/20 0825 03/30/20 0900  BP:  (!) 108/54  Pulse:  61  Resp:  (!) 48  Temp:    SpO2: 95% 98%   Vitals:   03/30/20 0700 03/30/20 0744 03/30/20 0825 03/30/20 0900  BP: (!) 99/37   (!) 108/54  Pulse: 60   61  Resp: (!)  27   (!) 48  Temp:  98.3 F (36.8 C)    TempSrc:  Oral    SpO2: 99%  95% 98%  Weight:      Height:        General: Pt is alert, awake, not in acute distress Cardiovascular: RRR, S1/S2 +, no rubs, no gallops Respiratory: CTA bilaterally, no wheezing, no rhonchi, 3 L nasal cannula oxygen Abdominal: Soft, NT, ND, bowel sounds + Extremities: no edema, no cyanosis    The results of significant diagnostics from this hospitalization (including imaging, microbiology, ancillary and laboratory) are listed below for reference.     Microbiology: Recent Results (from the past 240 hour(s))  SARS Coronavirus 2 by RT PCR (hospital order, performed in [facility] hospital lab) Nasopharyngeal Nasopharyngeal Swab     Status: None   Collection Time: 03/27/20 10:58 PM   Specimen: Nasopharyngeal Swab  Result Value Ref Range Status   SARS Coronavirus 2 NEGATIVE NEGATIVE Final    Comment: (NOTE) SARS-CoV-2 target nucleic acids are NOT DETECTED.  The SARS-CoV-2 RNA is generally detectable in upper and  lower respiratory specimens during the acute phase of infection. The lowest concentration of SARS-CoV-2 viral copies this assay can detect is 250 copies / mL. A negative result does not preclude SARS-CoV-2 infection and should not be used as the sole basis for treatment or other patient management decisions.  A negative result may occur with improper specimen collection / handling, submission of specimen other than nasopharyngeal swab, presence of viral mutation(s) within the areas targeted by this assay, and inadequate number of viral copies (<250 copies / mL). A negative result must be combined with clinical observations, patient history, and epidemiological information.  Fact Sheet for Patients:   StrictlyIdeas.no  Fact Sheet for Healthcare Providers: BankingDealers.co.za  This test is not yet approved or  cleared by the [place] FDA and has  been authorized for detection and/or diagnosis of SARS-CoV-2 by FDA under an Emergency Use Authorization (EUA).  This EUA will remain in effect (meaning this test can be used) for the duration of the COVID-19 declaration under Section 564(b)(1) of the Act, 21 U.S.C. section 360bbb-3(b)(1), unless the authorization is terminated or revoked sooner.  Performed at [facility], [address], [place], [place] [address]   MRSA PCR Screening     Status: None   Collection Time: 03/28/20 12:21 AM   Specimen: Nasal Mucosa; Nasopharyngeal  Result Value Ref Range Status   MRSA by PCR NEGATIVE NEGATIVE Final    Comment:        The GeneXpert MRSA Assay (FDA approved for NASAL specimens only), is one component of a comprehensive MRSA colonization surveillance program. It is not intended to diagnose MRSA infection nor to guide or monitor treatment for MRSA infections. Performed at [facility], [address] [place], [place] [address]      Labs: BNP (last 3 results) Recent Labs    02/07/20 1721 02/18/20 1506 03/27/20 1920  BNP 815.0* 729.0* 027.2*   Basic Metabolic Panel: Recent Labs  Lab 03/27/20 1920 03/28/20 0505 03/29/20 0426 03/30/20 0516  NA 142 143 142 140  K 3.9 4.3 5.1 4.6  CL 98 99 97* 94*  CO2 32 31 31 33*  GLUCOSE 100* 156* 152* 163*  BUN 45* 45* 58* 80*  CREATININE 2.51* 2.30* 2.34* 2.45*  CALCIUM 7.9* 7.8* 8.4* 8.2*  MG  --  1.7 1.9 2.0   Liver Function Tests: Recent Labs  Lab 03/28/20 0505  AST 39  ALT 29  ALKPHOS 132*  BILITOT 0.7  PROT 5.9*  ALBUMIN 3.3*   No results for input(s): LIPASE, AMYLASE in the last 168 hours. No results for input(s): AMMONIA in the last 168 hours. CBC: Recent Labs  Lab 03/27/20 1920 03/28/20 0505 03/29/20 0426 03/30/20 0516  WBC 5.4 4.7 5.7 9.5  NEUTROABS 4.1 4.4  --   --   HGB 12.4 11.4* 11.7* 11.6*  HCT 41.5 38.3 38.8 38.2  MCV 89.8 89.1 88.6 87.4  PLT 144* 124* 151 172   Cardiac Enzymes: No results for  input(s): CKTOTAL, CKMB, CKMBINDEX, TROPONINI in the last 168 hours. BNP: Invalid input(s): POCBNP CBG: Recent Labs  Lab 03/29/20 1601 03/29/20 2002 03/30/20 0038 03/30/20 0410 03/30/20 0746  GLUCAP 146* 179* 108* 169* 128*   D-Dimer Recent Labs    03/28/20 0505  DDIMER 0.36   Hgb A1c Recent Labs    03/28/20 0752  HGBA1C 5.1   Lipid Profile No results for input(s): CHOL, HDL, LDLCALC, TRIG, CHOLHDL, LDLDIRECT in the last 72 hours. Thyroid function studies Recent Labs  03/28/20 0505  TSH 0.551   Anemia work up No results for input(s): VITAMINB12, FOLATE, FERRITIN, TIBC, IRON, RETICCTPCT in the last 72 hours. Urinalysis    Component Value Date/Time   COLORURINE YELLOW 03/28/2020 0500   APPEARANCEUR CLEAR 03/28/2020 0500   LABSPEC 1.012 03/28/2020 0500   PHURINE 5.0 03/28/2020 0500   GLUCOSEU NEGATIVE 03/28/2020 0500   HGBUR NEGATIVE 03/28/2020 0500   BILIRUBINUR NEGATIVE 03/28/2020 0500   KETONESUR NEGATIVE 03/28/2020 0500   PROTEINUR NEGATIVE 03/28/2020 0500   UROBILINOGEN 0.2 03/14/2013 1847   NITRITE NEGATIVE 03/28/2020 0500   LEUKOCYTESUR NEGATIVE 03/28/2020 0500   Sepsis Labs Invalid input(s): PROCALCITONIN,  WBC,  LACTICIDVEN Microbiology Recent Results (from the past 240 hour(s))  SARS Coronavirus 2 by RT PCR (hospital order, performed in [facility] hospital lab) Nasopharyngeal Nasopharyngeal Swab     Status: None   Collection Time: 03/27/20 10:58 PM   Specimen: Nasopharyngeal Swab  Result Value Ref Range Status   SARS Coronavirus 2 NEGATIVE NEGATIVE Final    Comment: (NOTE) SARS-CoV-2 target nucleic acids are NOT DETECTED.  The SARS-CoV-2 RNA is generally detectable in upper and lower respiratory specimens during the acute phase of infection. The lowest concentration of SARS-CoV-2 viral copies this assay can detect is 250 copies / mL. A negative result does not preclude SARS-CoV-2 infection and should not be used as the sole basis for  treatment or other patient management decisions.  A negative result may occur with improper specimen collection / handling, submission of specimen other than nasopharyngeal swab, presence of viral mutation(s) within the areas targeted by this assay, and inadequate number of viral copies (<250 copies / mL). A negative result must be combined with clinical observations, patient history, and epidemiological information.  Fact Sheet for Patients:   StrictlyIdeas.no  Fact Sheet for Healthcare Providers: BankingDealers.co.za  This test is not yet approved or  cleared by the [place] FDA and has been authorized for detection and/or diagnosis of SARS-CoV-2 by FDA under an Emergency Use Authorization (EUA).  This EUA will remain in effect (meaning this test can be used) for the duration of the COVID-19 declaration under Section 564(b)(1) of the Act, 21 U.S.C. section 360bbb-3(b)(1), unless the authorization is terminated or revoked sooner.  Performed at [facility], [address] [place], [place] [address]   MRSA PCR Screening     Status: None   Collection Time: 03/28/20 12:21 AM   Specimen: Nasal Mucosa; Nasopharyngeal  Result Value Ref Range Status   MRSA by PCR NEGATIVE NEGATIVE Final    Comment:        The GeneXpert MRSA Assay (FDA approved for NASAL specimens only), is one component of a comprehensive MRSA colonization surveillance program. It is not intended to diagnose MRSA infection nor to guide or monitor treatment for MRSA infections. Performed at [facility], [address], [place],  [address]      Time coordinating discharge: 35 minutes  SIGNED:   Rodena Goldmann, DO [facility] 03/30/2020, 10:26 AM  If 7PM-7AM, please contact night-coverage [email or web address]

## 2020-03-30 NOTE — Progress Notes (Signed)
Patient being discharged to home. IV access removed. Paperwork given to patient and where medication prescription had been called in.

## 2020-03-30 NOTE — Progress Notes (Addendum)
[facility] Progress Note  Name: Erica [name] MRN: [id number] DOB: [birth year]  Chief Complaint:  Shortness of breath   Subjective:  She got lasix 80 mg IV once on 7/5.  She's been weaned to 3 liters.  Foley was removed - she had 400 mL and 3 unmeasured voids over 7/5.  She states that she feels well and wants to go home - asked that she discuss with primary team.  Still in the ICU.  Note cardiology was consulted today per primary.  States that she's urinating without difficulty and feels like she's emptying.  Review of systems: Denies shortness of breath  Denies cp Denies n/v  ------------- Background on consult:  Erica [name] is a 69 y.o. female with a history including CKD stage IV, hypertension, tobacco abuse, COPD on home oxygen, chronic diastolic CHF, diabetes, and history of CVA who presented to the hospital with shortness of breath.  Nephrology is consulted for assistance with management of diuretics. Her creatinine is stable - trends as below.  Has a baseline of around 2.5 - 3 which had improved to around 2.3 most recently.  Urine output is charted as 160 mL thus far on 7/4 and 250 mL for 7/3; strict ins/outs do not seem to be available - has a purewick.  Note per chart review she was given a dose of lasix 80 mg on 7/3 and was started on a lasix gtt which is at 4 mg/hr.  Note that she is also charted as getting 750 mL fluid bolus on 7/3.  She was transitioned to BIPAP and then weaned to 6 liters high flow nasal cannula as of my exam.  She states she's normally on 3 liters oyxgen at home unless she feels bad - then will increase to 4 liters.  She feels much better now.  States sometimes hard to urinate and when she goes she urinates "for a long time".  She confirms on torsemide three times a week but she isn't able to confirm dose.  She has been evaluated by nephrology during prior admission in August 2020.  States she hasn't seen anyone in clinic - previously had a  "kidney doctor" in [place], [place] but they had a falling out some time ago and parted ways.   Intake/Output Summary (Last 24 hours) at 03/30/2020 0847 Last data filed at 03/29/2020 2117 Gross per 24 hour  Intake 126 ml  Output 400 ml  Net -274 ml    Vitals:  Vitals:   03/30/20 0600 03/30/20 0700 03/30/20 0744 03/30/20 0825  BP: (!) 102/38 (!) 99/37    Pulse: (!) 59 60    Resp: (!) 44 (!) 27    Temp:   98.3 F (36.8 C)   TempSrc:   Oral   SpO2: 96% 99%  95%  Weight:      Height:         Physical Exam:   General adult female in bed in no acute distress HEENT normocephalic atraumatic extraocular movements intact sclera anicteric Neck supple trachea midline Lungs basilar crackles (improved) on auscultation bilaterally; normal work of breathing at rest on 3 liters oxygen Heart S1S2 no rub  Abdomen soft nontender nondistended Extremities no edema  Psych normal mood and affect Neuro alert and oriented x 3 provides hx and follows commands gu - foley is out   Medications reviewed    Labs:  BMP Latest Ref Rng & Units 03/30/2020 03/29/2020 03/28/2020  Glucose 70 - 99 mg/dL 163(H) 152(H) 156(H)  BUN 8 - 23 mg/dL 80(H) 58(H) 45(H)  Creatinine 0.44 - 1.00 mg/dL 2.45(H) 2.34(H) 2.30(H)  Sodium 135 - 145 mmol/L 140 142 143  Potassium 3.5 - 5.1 mmol/L 4.6 5.1 4.3  Chloride 98 - 111 mmol/L 94(L) 97(L) 99  CO2 22 - 32 mmol/L 33(H) 31 31  Calcium 8.9 - 10.3 mg/dL 8.2(L) 8.4(L) 7.8(L)     Assessment/Plan:  # Acute on Chronic hypoxic resp failure  - has both COPD and CHF.  on 3-4 liters oxygen at home  - optimize volume as below   - continue oxygen support here - weaned to 3 liters - nebs and steroids per primary team   # Acute on chronic diastolic CHF - note home regimen appears to be 20 mg MWF - she cannot specifically confirm dose but does state she takes torsemide MWF  # CKD stage IV - Note longstanding HTN, DM, prior NSAID use as well as congenitally atrophied right kidney and  echogenic left kidney.  Baseline Cr near 2.3 recently.  Bladder scan negligible - BUN climbing - had diuretics as well as on steroids  - Strict ins/outs are ordered - reinforced with patient   # HTN  - hypotension noted     # Anemia - normocytic - may be secondary to chronic disease - no indication for ESA   dispo - per primary team   Claudia Desanctis, MD 03/30/2020 8:47 AM   Addendum.  Patient was discharged today and we were contacted per primary team to set up nephrology follow-up.  She appears to be assigned to the [facility] office.  Note that she has no showed several appointments and cancelled others.  Will request follow-up with [place] office.   Claudia Desanctis, MD 12:25 PM 03/30/2020

## 2020-03-30 NOTE — Consult Note (Addendum)
Cardiology Consultation:   Patient ID: Erica [name]; [id number]; 04-01-1951   Admit date: 03/27/2020 Date of Consult: 03/30/2020  Primary Care Provider: Iona Beard, MD Primary Cardiologist: Carlyle Dolly, MD Primary Electrophysiologist: None   Patient Profile:   Erica [name] is a 69 y.o. female with a history of diastolic heart failure, CKD stage IV, hypertension, previous stroke, type 2 diabetes mellitus, COPD, interstitial lung disease and PSVT who is being seen today for the evaluation of diastolic heart failure and pulmonary hypertension at the request of Dr. Manuella Ghazi.  History of Present Illness:   Erica [name] is currently admitted to the hospital with worsening shortness of breath, orthopnea, and palpitations.  She is being treated for acute on chronic hypercarbic and hypercapnic respiratory failure felt to be combination of COPD and potentially some fluid overload.  She tells me that she got in argument and was upset prompting a worsening in her breathing and further evaluation.  She has also been having some swelling in her ankles.  Echocardiogram on July 4 showed LVEF 60 to 65% with mild diastolic dysfunction, severely reduced RV contraction with evidence of severe pulmonary hypertension, RVSP estimated 55 mmHg, mild anterior leaflet prolapse of the mitral valve with mild mitral regurgitation, also mild tricuspid regurgitation.  Chart indicates COPD, she is on oxygen at home.  Does not have a pulmonologist at present.  I did locate a previous high-resolution chest CT from 2019 that indicated presence of interstitial lung disease and also calcified pleural plaques.  She also had evidence of multivessel coronary atherosclerosis.  She tells me today that she feels better, breathing back to baseline.  Leg swelling has resolved.  At home she is on Norvasc, Diovan and Coreg, all of which are presently held. She is also on Demadex 20 mg on Monday, Wednesday, and Friday.  Recent  heart rates in the 60s in sinus rhythm and systolics 57D to low 220U.  Past Medical History:  Diagnosis Date  . Arthritis   . Chronic back pain   . CKD (chronic kidney disease), stage IV ([place])   . COPD (chronic obstructive pulmonary disease) (H[facility])    2L home O2  . Diastolic heart failure ([place])   . Essential hypertension   . History of GI bleed    Suspected AVMs  . History of stroke   . Hyperlipidemia   . Interstitial lung disease ([place])   . PSVT (paroxysmal supraventricular tachycardia) ([place])   . Type 2 diabetes mellitus ([place])     Past Surgical History:  Procedure Laterality Date  . ABDOMINAL HYSTERECTOMY    . COLONOSCOPY  2009   Dr. Gala Romney: cluster of diminutive rectal polyps and 2 diminutive polyps in rectosigmoid junction, hyperplastic  . COLONOSCOPY WITH PROPOFOL N/A 10/18/2017   Procedure: COLONOSCOPY WITH PROPOFOL;  Surgeon: Daneil Dolin, MD;  Location: [facility];  Service: Endoscopy;  Laterality: N/A;  9:30am  . ESOPHAGOGASTRODUODENOSCOPY N/A 01/25/2018   Procedure: ESOPHAGOGASTRODUODENOSCOPY (EGD);  Surgeon: Danie Binder, MD;  Location: [facility];  Service: Endoscopy;  Laterality: N/A;  . ESOPHAGOGASTRODUODENOSCOPY (EGD) WITH PROPOFOL N/A 08/29/2017   Dr. Gala Romney with propofol: small hiatal hernia. 4cm fundal diverticulum.   Marland Kitchen GIVENS CAPSULE STUDY N/A 01/25/2018   Procedure: GIVENS CAPSULE STUDY;  Surgeon: Danie Binder, MD;  Location: [facility];  Service: Endoscopy;  Laterality: N/A;  . HAND SURGERY     In an accident at work, finger amputated     Inpatient Medications: Scheduled Meds: .  aspirin EC  162 mg Oral Daily  . atorvastatin  80 mg Oral Daily  . azithromycin  500 mg Oral Daily  . budesonide (PULMICORT) nebulizer solution  0.25 mg Nebulization BID  . chlorhexidine  15 mL Mouth Rinse BID  . Chlorhexidine Gluconate Cloth  6 each Topical Daily  . clopidogrel  75 mg Oral Daily  . docusate sodium  100 mg Oral BID  . famotidine  20 mg Oral Daily  .  feeding supplement (ENSURE ENLIVE)  237 mL Oral BID BM  . feeding supplement (PRO-STAT SUGAR FREE 64)  30 mL Oral BID  . ferrous sulfate  325 mg Oral BID  . guaiFENesin  600 mg Oral BID  . heparin  5,000 Units Subcutaneous Q8H  . insulin aspart  0-9 Units Subcutaneous Q4H  . ipratropium-albuterol  3 mL Nebulization TID  . mouth rinse  15 mL Mouth Rinse q12n4p  . methylPREDNISolone (SOLU-MEDROL) injection  40 mg Intravenous Q12H  . nicotine  14 mg Transdermal Daily  . omega-3 acid ethyl esters  1 g Oral Daily  . pantoprazole  40 mg Oral Daily  . sodium chloride flush  3 mL Intravenous Q12H  . umeclidinium bromide  1 puff Inhalation Daily   Continuous Infusions: . sodium chloride 10 mL/hr at 03/28/20 0630   PRN Meds: sodium chloride, acetaminophen **OR** acetaminophen, oxyCODONE, polyethylene glycol, traZODone  Allergies:   No Known Allergies  Social History:   Social History   Tobacco Use  . Smoking status: Current Some Day Smoker    Packs/day: 0.25    Years: 52.00    Pack years: 13.00    Types: Cigarettes    Last attempt to quit: 09/23/2017    Years since quitting: 2.5  . Smokeless tobacco: Never Used  Substance Use Topics  . Alcohol use: No   Family History:   The patient's family history includes CVA (age of onset: 54) in her mother. There is no history of Cancer, Colon cancer, or Breast cancer.  ROS:  Please see the history of present illness.  All other ROS reviewed and negative.      Physical Exam/Data:   Vitals:   03/30/20 0600 03/30/20 0700 03/30/20 0744 03/30/20 0825  BP: (!) 102/38 (!) 99/37    Pulse: (!) 59 60    Resp: (!) 44 (!) 27    Temp:   98.3 F (36.8 C)   TempSrc:   Oral   SpO2: 96% 99%  95%  Weight:      Height:        Intake/Output Summary (Last 24 hours) at 03/30/2020 0850 Last data filed at 03/29/2020 2117 Gross per 24 hour  Intake 126 ml  Output 400 ml  Net -274 ml   Filed Weights   03/28/20 0500 03/29/20 0500 03/30/20 0500    Weight: 64.2 kg 64.6 kg 63.1 kg   Body mass index is 23.88 kg/m.   Gen: Patient appears comfortable at rest wearing oxygen via nasal cannula. HEENT: Conjunctiva and lids normal, oropharynx clear with moist mucosa. Neck: Supple, no elevated JVP or carotid bruits, no thyromegaly. Lungs: Scattered coarse rhonchi and end expiratory wheeze particularly at the bases. Cardiac: Regular rate and rhythm, no S3, 2/6 systolic murmur, no pericardial rub. Abdomen: Soft, nontender, bowel sounds present. Extremities: No pitting edema, distal pulses 2+. Skin: Warm and dry. Musculoskeletal: No kyphosis. Neuropsychiatric: Alert and oriented x3, affect grossly appropriate.  EKG:  An ECG dated 03/27/2020 was personally reviewed today and demonstrated:  Sinus rhythm with biatrial enlargement, significant diffuse repolarization abnormalities.  Telemetry:  I personally reviewed telemetry which shows sinus rhythm.  Laboratory Data:  Chemistry Recent Labs  Lab 03/28/20 0505 03/29/20 0426 03/30/20 0516  NA 143 142 140  K 4.3 5.1 4.6  CL 99 97* 94*  CO2 31 31 33*  GLUCOSE 156* 152* 163*  BUN 45* 58* 80*  CREATININE 2.30* 2.34* 2.45*  CALCIUM 7.8* 8.4* 8.2*  GFRNONAA 21* 20* 19*  GFRAA 24* 24* 22*  ANIONGAP 13 14 13     Recent Labs  Lab 03/28/20 0505  PROT 5.9*  ALBUMIN 3.3*  AST 39  ALT 29  ALKPHOS 132*  BILITOT 0.7   Hematology Recent Labs  Lab 03/28/20 0505 03/29/20 0426 03/30/20 0516  WBC 4.7 5.7 9.5  RBC 4.30 4.38 4.37  HGB 11.4* 11.7* 11.6*  HCT 38.3 38.8 38.2  MCV 89.1 88.6 87.4  MCH 26.5 26.7 26.5  MCHC 29.8* 30.2 30.4  RDW 14.8 14.6 14.6  PLT 124* 151 172   Cardiac Enzymes Recent Labs  Lab 03/27/20 1920 03/27/20 2131  TROPONINIHS 52* 48*   BNP Recent Labs  Lab 03/27/20 1920  BNP 783.0*    DDimer Recent Labs  Lab 03/28/20 0505  DDIMER 0.36    Radiology/Studies:  DG CHEST PORT 1 VIEW  Result Date: 03/28/2020 CLINICAL DATA:  69 year old female with  history of COPD exacerbation. EXAM: PORTABLE CHEST 1 VIEW COMPARISON:  Chest x-ray 03/27/2020. FINDINGS: Right-sided calcified pleural plaques are again noted. Lung volumes are slightly low with widespread areas of interstitial prominence and scattered areas of bronchiectasis, similar to prior studies, compatible with underlying interstitial lung disease. Small bilateral pleural effusions. No pneumothorax. No evidence of pulmonary edema. Heart size is mildly enlarged. Upper mediastinal contours are within normal limits. Aortic atherosclerosis. IMPRESSION: 1. Small bilateral pleural effusions. 2. Mild cardiomegaly. 3. Chronic interstitial lung disease, similar to prior studies. 4. Aortic atherosclerosis. Electronically Signed   By: Vinnie Langton M.D.   On: 03/28/2020 08:07   DG Chest Portable 1 View  Result Date: 03/27/2020 CLINICAL DATA:  Shortness of breath, suspected COPD exacerbation EXAM: PORTABLE CHEST 1 VIEW COMPARISON:  02/18/2020 FINDINGS: Cardiomediastinal contours are enlarged. Blunting of RIGHT and LEFT costodiaphragmatic sulcus. Perhaps slightly increased when compared to the prior study. Increased interstitial markings similar to the prior exam. No lobar consolidation. Visualized skeletal structures on limited assessment without acute process. IMPRESSION: Slight increase in bilateral effusions may indicate heart failure in this patient with marked cardiomegaly. Background interstitial changes are similar and there is chronic interstitial thickening. Superimposed edema is possible though there is no change in this appearance from the previous radiograph. Electronically Signed   By: Zetta Bills M.D.   On: 03/27/2020 20:10   ECHOCARDIOGRAM COMPLETE  Result Date: 03/28/2020    ECHOCARDIOGRAM REPORT   Patient Name:   LYRICAL SOWLE Date of Exam: 03/28/2020 Medical Rec #:  [id number]        Height:       64.0 in Accession #:    [id number]       Weight:       141.5 lb Date of Birth:  [birth year]         BSA:          1.689 m Patient Age:    33 years         BP:           97/47 mmHg Patient Gender: F  HR:           88 bpm. Exam Location:  [facility] Procedure: 2D Echo, Cardiac Doppler and Color Doppler Indications:    CHF-Acute Diastolic 161.09 / U04.54  History:        Patient has prior history of Echocardiogram examinations, most                 recent 09/04/2018. CHF, COPD; Risk Factors:Diabetes,                 Hypertension and Dyslipidemia. Acute respiratory failure with                 hypoxia,Tobacco abuse.  Sonographer:    Alvino Chapel RCS Referring Phys: [id number] ASIA B [place]  Sonographer Comments: Patient BiPAP at time of echocardiogram IMPRESSIONS  1. Hypokinesis of the basal inferior wall with overall normal LV systolic function; mild LVH; grade 1 diastolic dsyfunction; D shaped septum suggestive of pulmonary hypertension; probable prolapse of anterior MV leaflet with mild MR; moderate RAE and RVE with severe RV dysfunction; moderate pulmonary hypertension.  2. Left ventricular ejection fraction, by estimation, is 60 to 65%. The left ventricle has normal function. The left ventricle demonstrates regional wall motion abnormalities (see scoring diagram/findings for description). There is mild left ventricular  hypertrophy. Left ventricular diastolic parameters are consistent with Grade I diastolic dysfunction (impaired relaxation).  3. Right ventricular systolic function is severely reduced. The right ventricular size is moderately enlarged. There is moderately elevated pulmonary artery systolic pressure.  4. Right atrial size was moderately dilated.  5. The mitral valve is normal in structure. Mild mitral valve regurgitation. No evidence of mitral stenosis.  6. The aortic valve is tricuspid. Aortic valve regurgitation is not visualized. No aortic stenosis is present.  7. The inferior vena cava is dilated in size with <50% respiratory variability, suggesting right atrial pressure of  15 mmHg. FINDINGS  Left Ventricle: Left ventricular ejection fraction, by estimation, is 60 to 65%. The left ventricle has normal function. The left ventricle demonstrates regional wall motion abnormalities. The left ventricular internal cavity size was normal in size. There is mild left ventricular hypertrophy. Left ventricular diastolic parameters are consistent with Grade I diastolic dysfunction (impaired relaxation). Right Ventricle: The right ventricular size is moderately enlarged.Right ventricular systolic function is severely reduced. There is moderately elevated pulmonary artery systolic pressure. The tricuspid regurgitant velocity is 3.19 m/s, and with an assumed right atrial pressure of 15 mmHg, the estimated right ventricular systolic pressure is 47.8 mmHg. Left Atrium: Left atrial size was normal in size. Right Atrium: Right atrial size was moderately dilated. Pericardium: Trivial pericardial effusion is present. Mitral Valve: The mitral valve is normal in structure. There is mild prolapse of anterior leaflet of the mitral valve. Normal mobility of the mitral valve leaflets. Mild mitral valve regurgitation. No evidence of mitral valve stenosis. Tricuspid Valve: The tricuspid valve is normal in structure. Tricuspid valve regurgitation is mild . No evidence of tricuspid stenosis. Aortic Valve: The aortic valve is tricuspid. Aortic valve regurgitation is not visualized. No aortic stenosis is present. Pulmonic Valve: The pulmonic valve was normal in structure. Pulmonic valve regurgitation is mild. No evidence of pulmonic stenosis. Aorta: The aortic root is normal in size and structure. Venous: The inferior vena cava is dilated in size with less than 50% respiratory variability, suggesting right atrial pressure of 15 mmHg. IAS/Shunts: There is left bowing of the interatrial septum, suggestive of elevated right atrial pressure. No atrial level  shunt detected by color flow Doppler. Additional Comments:  Hypokinesis of the basal inferior wall with overall normal LV systolic function; mild LVH; grade 1 diastolic dsyfunction; D shaped septum suggestive of pulmonary hypertension; probable prolapse of anterior MV leaflet with mild MR; moderate RAE and RVE with severe RV dysfunction; moderate pulmonary hypertension.  LEFT VENTRICLE PLAX 2D LVIDd:         4.38 cm  Diastology LVIDs:         2.32 cm  LV e' lateral:   10.20 cm/s LV PW:         1.25 cm  LV E/e' lateral: 9.6 LV IVS:        1.18 cm  LV e' medial:    4.79 cm/s LVOT diam:     1.90 cm  LV E/e' medial:  20.4 LV SV:         70 LV SV Index:   41 LVOT Area:     2.84 cm  RIGHT VENTRICLE RV S prime:     8.27 cm/s TAPSE (M-mode): 2.1 cm LEFT ATRIUM             Index       RIGHT ATRIUM           Index LA diam:        4.00 cm 2.37 cm/m  RA Area:     21.10 cm LA Vol (A2C):   46.6 ml 27.59 ml/m RA Volume:   71.00 ml  42.04 ml/m LA Vol (A4C):   31.0 ml 18.35 ml/m LA Biplane Vol: 37.4 ml 22.14 ml/m  AORTIC VALVE LVOT Vmax:   121.00 cm/s LVOT Vmean:  72.800 cm/s LVOT VTI:    0.247 m  AORTA Ao Root diam: 2.70 cm MITRAL VALVE                TRICUSPID VALVE MV Area (PHT): 3.19 cm     TR Peak grad:   40.7 mmHg MV Decel Time: 238 msec     TR Vmax:        319.00 cm/s MV E velocity: 97.50 cm/s MV A velocity: 113.00 cm/s  SHUNTS MV E/A ratio:  0.86         Systemic VTI:  0.25 m                             Systemic Diam: 1.90 cm Kirk Ruths MD Electronically signed by Kirk Ruths MD Signature Date/Time: 03/28/2020/10:25:41 AM    Final     Assessment and Plan:   1.  Chronic diastolic heart failure by history, recent follow-up echocardiogram reveals LVEF 60 to 65% with mild diastolic dysfunction.  She has been diuresed during hospital stay with IV Lasix, blood pressure is low normal at this time and creatinine has bumped up further.  She does not appear substantially fluid overloaded on examination.  At the present time she is no longer on Coreg, Diovan, or Norvasc which  she was taking as an outpatient.  2.  Severe RV dysfunction in the setting of severe pulmonary hypertension, likely due to underlying COPD and potentially interstitial lung disease based on previous chest CT 2019.  She has associated chronic hypoxic respiratory failure.  This also contributes to her leg swelling, she is on Demadex 3 days a week as an outpatient.  Suspect that this is more of a contributor to her shortness of breath and problem #1.  3.  Coronary atherosclerosis based  on chest CT imaging 2019.  No active angina reported at this time.  She is already on aspirin and Plavix with previous history of stroke, also on Lipitor.  ECG with diffuse nonspecific ST-T wave abnormalities.  High-sensitivity troponin I levels are minimally increased and in flat pattern, 40s to 50s, not suggestive of ACS.  4.  CKD stage IV, current creatinine 2.45.  Chart reviewed and history updated.  My sense is that her shortness of breath is more pulmonary related than necessarily associated with acute on chronic diastolic heart failure.  Recommend holding diuretics at this time given low normal blood pressure with bump in creatinine and improvement in her leg swelling reported previously (which is probably related to cor pulmonale).  She is somewhat preload dependent in light of severe pulmonary hypertension and severe RV dysfunction as well.  Would not resume Coreg, Diovan, or Norvasc at the present time in light of her current hemodynamics.  Consider follow-up high-resolution chest CT for reassessment of suspected interstitial lung disease, she likely needs to be established with Pulmonary at least as an outpatient for ongoing management.  I do not think that further invasive cardiac testing is warranted at this time particularly in light of her CKD stage IV and high risk of contrast nephropathy.  A right heart catheterization may might ultimately be considered down the road if this would be helpful in management of her  pulmonary hypertension, but this could be arranged as an outpatient with appropriate follow-up in the pulmonary hypertension clinic.  Continue aspirin, Plavix, and statin therapy.  Increase activity as tolerated.  Signed, Rozann Lesches, MD  03/30/2020 8:50 AM

## 2020-04-01 DIAGNOSIS — J849 Interstitial pulmonary disease, unspecified: Secondary | ICD-10-CM | POA: Diagnosis not present

## 2020-04-01 DIAGNOSIS — R0902 Hypoxemia: Secondary | ICD-10-CM | POA: Diagnosis not present

## 2020-04-01 DIAGNOSIS — J441 Chronic obstructive pulmonary disease with (acute) exacerbation: Secondary | ICD-10-CM | POA: Diagnosis not present

## 2020-04-01 DIAGNOSIS — I10 Essential (primary) hypertension: Secondary | ICD-10-CM | POA: Diagnosis not present

## 2020-04-01 DIAGNOSIS — E139 Other specified diabetes mellitus without complications: Secondary | ICD-10-CM | POA: Diagnosis not present

## 2020-04-01 DIAGNOSIS — I509 Heart failure, unspecified: Secondary | ICD-10-CM | POA: Diagnosis not present

## 2020-04-01 DIAGNOSIS — N184 Chronic kidney disease, stage 4 (severe): Secondary | ICD-10-CM | POA: Diagnosis not present

## 2020-04-12 DIAGNOSIS — N184 Chronic kidney disease, stage 4 (severe): Secondary | ICD-10-CM | POA: Diagnosis not present

## 2020-04-15 DIAGNOSIS — I10 Essential (primary) hypertension: Secondary | ICD-10-CM | POA: Diagnosis not present

## 2020-04-15 DIAGNOSIS — I509 Heart failure, unspecified: Secondary | ICD-10-CM | POA: Diagnosis not present

## 2020-04-20 ENCOUNTER — Other Ambulatory Visit: Payer: Self-pay | Admitting: Cardiology

## 2020-04-20 DIAGNOSIS — I509 Heart failure, unspecified: Secondary | ICD-10-CM

## 2020-04-23 DIAGNOSIS — N182 Chronic kidney disease, stage 2 (mild): Secondary | ICD-10-CM | POA: Diagnosis not present

## 2020-04-23 DIAGNOSIS — I130 Hypertensive heart and chronic kidney disease with heart failure and stage 1 through stage 4 chronic kidney disease, or unspecified chronic kidney disease: Secondary | ICD-10-CM | POA: Diagnosis not present

## 2020-04-23 DIAGNOSIS — I5032 Chronic diastolic (congestive) heart failure: Secondary | ICD-10-CM | POA: Diagnosis not present

## 2020-04-23 DIAGNOSIS — E1122 Type 2 diabetes mellitus with diabetic chronic kidney disease: Secondary | ICD-10-CM | POA: Diagnosis not present

## 2020-05-19 ENCOUNTER — Other Ambulatory Visit: Payer: Self-pay

## 2020-05-19 ENCOUNTER — Encounter: Payer: Self-pay | Admitting: Internal Medicine

## 2020-05-19 ENCOUNTER — Ambulatory Visit (INDEPENDENT_AMBULATORY_CARE_PROVIDER_SITE_OTHER): Payer: Medicare Other | Admitting: Internal Medicine

## 2020-05-19 DIAGNOSIS — J449 Chronic obstructive pulmonary disease, unspecified: Secondary | ICD-10-CM | POA: Diagnosis not present

## 2020-05-19 DIAGNOSIS — J61 Pneumoconiosis due to asbestos and other mineral fibers: Secondary | ICD-10-CM | POA: Diagnosis not present

## 2020-05-19 DIAGNOSIS — J9611 Chronic respiratory failure with hypoxia: Secondary | ICD-10-CM | POA: Diagnosis not present

## 2020-05-19 DIAGNOSIS — F17210 Nicotine dependence, cigarettes, uncomplicated: Secondary | ICD-10-CM | POA: Diagnosis not present

## 2020-05-19 DIAGNOSIS — J9612 Chronic respiratory failure with hypercapnia: Secondary | ICD-10-CM

## 2020-05-19 NOTE — Patient Instructions (Addendum)
The key is to stop smoking completely before smoking completely stops you!  The goal is to keep saturations above 90% at all time   [facility] is the asbestos specialist in [place]    Please schedule a follow up office visit in 6 weeks, call sooner if needed with pfts on return  Add:  Bring all inhalers/ devices to next visit

## 2020-05-19 NOTE — Assessment & Plan Note (Signed)
HC03  04/19/20  = 33  - rx as of 05/19/2020  4 lpm concentrator and titrate with activity to keep > 90%   Advised: Make sure you check your oxygen saturations at highest level of activity to be sure it stays over 90% and adjust upward to maintain this level if needed but remember to turn it back to previous settings when you stop (to conserve your supply).

## 2020-05-19 NOTE — Progress Notes (Signed)
Erica [name], female    DOB: Jan 20, 1951,   MRN: [id number]   Brief patient profile:  84 yobf still smoking despite 02 dep x 2011 with h/o asbestos exp in the late 1970's ? 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 Follow up with PCP in 1-2 weeks 2. Please obtain BMP/CBC in one week 3. Continue to hold Coreg, Diovan, and Norvasc until seen in clinic and blood pressures are noted to improve 4. May resume torsemide starting 7/7 at 20 mg dose Monday Wednesday Friday as recommended by nephrology 5. Patient will be scheduled for outpatient nephrology visit 6. Patient will be scheduled for outpatient pulmonology visit due to concern for interstitial lung disease 7. Continue on prednisone 40 mg daily as scheduled for the next 5 days 8. Continue home breathing treatments as needed for any shortness of breath or wheezing 9. Remain on aspirin, Plavix, and statin follow-up with cardiologist Dr. Harl Bowie as requested in 2 weeks  Home Health: None  Equipment/Devices: Has home nasal cannula 3 L-chronic  Discharge Condition: Stable  CODE STATUS: Full  Diet recommendation: Heart Healthy/carb modified  Brief/Interim Summary: Per HPI: [name] a43 y.o.female,with history of stroke, HTN, HLD, DMII, COPD, CHF and more presents to the ED with dyspnea. Patient reports onset was one day PTA. Patient reports that it has been progressively worse. The dyspnea is worse on exertion, and better with rest. She reports orthopnea at home and palpitations. She has no chest pain, diaphoresis, or nausea. She has no history of MI. She is a current smoker. She has associated swelling. Patient reports cough that is productive of thick yellow sputum. She has not had any fevers or body aches. No ill contacts.  Patient reports decrease in urine output. Her last void was the AM of 03/27/20. It was non painful, non bloody, and there was not malodorous. Patient reports that she has been constipated. She has tried to take linzess, without relief.  Acute combined hypercapnic and hypoxemic respiratory failure-multifactorial-resolved -Appears to be related to some diastolic CHF exacerbation as well as COPD, but predominantly more COPD exacerbation  Acute diastolic CHF exacerbation-resolved -Back to baseline level of oxygen requirement -2D echocardiogramon 7/4 with severe RV dysfunction and moderate pulmonary hypertension noted with LVEF 60-65%. Hypokinesis noted in left ventricle as well. May require invasive testing given these findings.  -We will follow-up with cardiology in outpatient setting -Cardiology recommends follow-up with pulmonology due to suspicion of interstitial lung disease with no recommendations for invasive cardiac testing at this time in light of CKD stage IV and high risk of contrast nephropathy -May require right heart catheterization in the near future to assist with management of pulmonary hypertension  Acute COPD exacerbation-improved -Wears chronic 3-4 L nasal cannula oxygen at home -Continue on azithromycin for 1 more day as prescribed to complete 5-day course of treatment -Continue on prednisone 40 mg daily for next 5 days -Continue home breathing treatments as needed -We will schedule with pulmonology in [place] for further follow-up, also with concern for interstitial lung disease  History of hypertension -Currently with some lower blood pressure readings after diuresis -Stable for discharge and will hold home Coreg, Diovan, Norvasc for now -Follow-up with PCP and reassess blood pressure readings and resume medications as appropriate  Chronic tobacco use -Nicotine patch declined on discharge -  Counseled on cessation  Troponin elevation -No chest pain or signs  of ACS noted -Monitor on telemetry -Likely demand ischemia -Significant findings as noted above on 2D echocardiogram with cardiology recommendations as noted to follow-up with pulmonology and consider right heart catheterization in the near future  Stage IV CKD  -Appreciate nephrology assistancewith diuresis -Nephrology will schedule for outpatient follow-up visit -Continue torsemide 20 mg on Monday Wednesday and Friday to resume on 7/7  Discharge Diagnoses:  Active Problems:   Acute respiratory failure with hypoxia [facility])      History of Present Illness  05/19/2020  Pulmonary/ 1st office eval/Grae Cannata post hosp "aecopd"  With known ILD / 02 dep  Chief Complaint  Patient presents with  . Pulmonary Consult    Referred by Dr Heath Lark. Pt c/o DOE that started back in 2001- worse over the past year. She states gets winded walking approx 25 ft. She has been using o2 "for years"   Dyspnea:  Across the room even on 4lpm does not check sats  Cough: none  Sleep: able to lie on side bed is flat 2 pillows  SABA use: duoneb first thing in am and "good for the whole day" 02 conc 4lpm   No obvious day to day or daytime variability or assoc excess/ purulent sputum or mucus plugs or hemoptysis or cp or chest tightness, subjective wheeze or overt sinus or hb symptoms.   Sleeping  without nocturnal  or early am exacerbation  of respiratory  c/o's or need for noct saba. Also denies any obvious fluctuation of symptoms with weather or environmental changes or other aggravating or alleviating factors except as outlined above   No unusual exposure hx or h/o childhood pna/ asthma or knowledge of premature birth.  Current Allergies, Complete Past Medical History, Past Surgical History, Family History, and Social History were reviewed in [employer] record.  ROS  The following are not active complaints unless bolded Hoarseness, sore throat, dysphagia, dental problems, itching,  sneezing,  nasal congestion or discharge of excess mucus or purulent secretions, ear ache,   fever, chills, sweats, unintended wt loss or wt gain, classically pleuritic or exertional cp,  orthopnea pnd or arm/hand swelling  or leg swelling, presyncope, palpitations, abdominal pain, anorexia, nausea, vomiting, diarrhea  or change in bowel habits or change in bladder habits, change in stools or change in urine, dysuria, hematuria,  rash, arthralgias, visual complaints, headache, numbness, weakness or ataxia or problems with walking or coordination,  change in mood or  memory.           Past Medical History:  Diagnosis Date  . Arthritis   . Chronic back pain   . CKD (chronic kidney disease), stage IV ([place])   . COPD (chronic obstructive pulmonary disease) (HCC)    2L home O2  . Diastolic heart failure ([place])   . Essential hypertension   . History of GI bleed    Suspected AVMs  . History of stroke   . Hyperlipidemia   . Interstitial lung disease ([place])   . PSVT (paroxysmal supraventricular tachycardia) ([place])   . Type 2 diabetes mellitus ([place])     Outpatient Medications Prior to Visit - - NOTE:   Unable to verify as accurately reflecting what pt takes     Medication Sig Dispense Refill  . albuterol (VENTOLIN HFA) 108 (90 Base) MCG/ACT inhaler Inhale 1 puff into the lungs every 6 (six) hours as needed for shortness of breath.    Marland Kitchen  Amino Acids-Protein Hydrolys (FEEDING SUPPLEMENT, PRO-STAT SUGAR FREE 64,) LIQD Take 30 mLs by mouth 2 (two) times daily. 887 mL 0  . famotidine (PEPCID) 20 MG tablet Take 20 mg by mouth daily.   10  . ferrous sulfate 325 (65 FE) MG EC tablet Take 1 tablet (325 mg total) by mouth 2 (two) times daily. 60 tablet 2  . guaiFENesin (MUCINEX) 600 MG 12 hr tablet Take 600 mg by mouth 2 (two) times daily as needed.    Marland Kitchen ipratropium-albuterol (DUONEB) 0.5-2.5 (3) MG/3ML SOLN Take 3 mLs by nebulization every 6 (six) hours as needed (for shortness of breath).    . Linaclotide  (LINZESS) 145 MCG CAPS capsule Take 145 mcg by mouth daily as needed (for constipation).     Marland Kitchen omega-3 acid ethyl esters (LOVAZA) 1 g capsule Take 1 g by mouth daily.     . pantoprazole (PROTONIX) 40 MG tablet Take 1 tablet (40 mg total) by mouth daily. 30 tablet 2  . SPIRIVA HANDIHALER 18 MCG inhalation capsule Take 1 puff by mouth daily.    . SYMBICORT 80-4.5 MCG/ACT inhaler Inhale 2 puffs into the lungs 2 (two) times daily.    Marland Kitchen torsemide (DEMADEX) 20 MG tablet Take 1 tablet (20 mg total) by mouth daily. Take 1 tablet on Monday, Wednesday, and Friday only. 90 tablet 3  . aspirin EC 81 MG tablet Take 162 mg by mouth daily.    Marland Kitchen atorvastatin (LIPITOR) 80 MG tablet Take 80 mg by mouth daily.     . clopidogrel (PLAVIX) 75 MG tablet Take 75 mg by mouth daily.   5  . feeding supplement, ENSURE ENLIVE, (ENSURE ENLIVE) LIQD Take 237 mLs by mouth 2 (two) times daily between meals. 237 mL 12         Objective:     BP (!) 94/54 (BP Location: Left Arm, Cuff Size: Normal)   Pulse (!) 108   Temp (!) 97 F (36.1 C) (Temporal)   Ht 5\' 4"  (1.626 m)   Wt 131 lb (59.4 kg)   SpO2 93%   BMI 22.49 kg/m   SpO2: 93 % O2 Type: Continuous O2 O2 Flow Rate (L/min): 2 L/min   Frail w/c bound thin bf >>> stated age in appearance   HEENT : pt wearing mask not removed for exam due to covid -19 concerns.    NECK :  without JVD/Nodes/TM/ nl carotid upstrokes bilaterally   LUNGS: no acc muscle use,  Nl contour chest with coarse insp crackles bilaterally without cough on insp or exp maneuvers   CV:  RRR  no s3 or murmur or increase in P2, and trace ankle  edema   ABD:  soft and nontender with nl inspiratory excursion in the supine position. No bruits or organomegaly appreciated, bowel sounds nl  MS:  Nl gait/ ext warm without deformities, calf tenderness, cyanosis or clubbing No obvious joint restrictions   SKIN: warm and dry without lesions    NEURO:  alert, approp, nl sensorium with  no motor or  cerebellar deficits apparent.            Assessment   Chronic respiratory failure with hypoxia and hypercapnia ([place]) HC03  04/19/20  = 33  - rx as of 05/19/2020  4 lpm concentrator and titrate with activity to keep > 90%   Advised: Make sure you check your oxygen saturations at highest level of activity to be sure it stays over 90% and adjust upward to maintain this  level if needed but remember to turn it back to previous settings when you stop (to conserve your supply).     COPD  GOLD ? / active smoker Active smoker - HRCT 3/272019 mild diffuse bronchial wall thickening with mild centrilobular and paraseptal emphysema; imaging findings suggestive of underlying COPD. - PFT's rec 05/19/2020   She only has mild copd findings on ct/ the predominant issue is ILD - either way the first key is to stop all smoking   I'm not sure what she's really taking at home but appears well compensated for now so will leave well enough along until returns with pfts to regroup   with all meds in hand using a trust but verify approach to confirm accurate Medication  Reconciliation The principal here is that until we are certain that the  patients are doing what we've asked, it makes no sense to ask them to do more.      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 Very unusual appearance of the lungs which is compatible with interstitial lung disease. 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 Accordingly, some of the interstitial lung changes could represent a manifestation of asbestosis. In particular, with the appearance in the lower lungs is favored to reflect nonspecific interstitial pneumonia (NSIP). However, the unusual appearance in the upper lungs which includes honeycombing could suggest a concurrent process such as chronic  hypersensitivity pneumonitis.   Says she was never told she has asbestosis but finding on ct assoc with ca pleural plaques c/w this dx and would explain why she's been symptomatic but still alive x 20 y p onset and 02 dep for so long as well.   Suggested she let [facility] investigate this exposure more thoroughly and see if she is entitled to benefits but little else to offer here other than 02.     Cigarette smoker 3 min discussion re active cigarette smoking in addition to office E&M  Ask about tobacco use:   ongoing Advise quitting   I emphasized that although we never turn away smokers from the pulmonary clinic, we do ask that they understand that the recommendations that we make  won't work nearly as well in the presence of continued cigarette exposure. In fact, we may very well  reach a point where we can't promise to help the patient if he/she can't quit smoking. (We can and will promise to try to help, we just can't promise what we recommend will really work)  Assess willingness:  Not committed at this point Assist in quit attempt:  Per PCP when ready Arrange follow up:   Follow up per Primary Care planned                   Each maintenance medication was reviewed in detail including emphasizing most importantly the difference between maintenance and prns and under what circumstances the prns are to be triggered using an action plan format where appropriate.  Total time for H and P, chart review, counseling, teaching how to titrate portable 02, use of nebs  and generating customized AVS unique to this office visit / charting = 45 min           Christinia Gully, MD 05/19/2020

## 2020-05-20 ENCOUNTER — Encounter: Payer: Self-pay | Admitting: Internal Medicine

## 2020-05-20 DIAGNOSIS — F17210 Nicotine dependence, cigarettes, uncomplicated: Secondary | ICD-10-CM | POA: Insufficient documentation

## 2020-05-20 NOTE — Assessment & Plan Note (Signed)
Active smoker - HRCT 3/272019 mild diffuse bronchial wall thickening with mild centrilobular and paraseptal emphysema; imaging findings suggestive of underlying COPD. - PFT's rec 05/19/2020   She only has mild copd findings on ct/ the predominant issue is ILD - either way the first key is to stop all smoking   I'm not sure what she's really taking at home but appears well compensated for now so will leave well enough along until returns with pfts to regroup   with all meds in hand using a trust but verify approach to confirm accurate Medication  Reconciliation The principal here is that until we are certain that the  patients are doing what we've asked, it makes no sense to ask them to do more.

## 2020-05-20 NOTE — Assessment & Plan Note (Addendum)
3 min discussion re active cigarette smoking in addition to office E&M  Ask about tobacco use:   ongoing Advise quitting   I emphasized that although we never turn away smokers from the pulmonary clinic, we do ask that they understand that the recommendations that we make  won't work nearly as well in the presence of continued cigarette exposure. In fact, we may very well  reach a point where we can't promise to help the patient if he/she can't quit smoking. (We can and will promise to try to help, we just can't promise what we recommend will really work)  Assess willingness:  Not committed at this point Assist in quit attempt:  Per PCP when ready Arrange follow up:   Follow up per Primary Care planned                   Each maintenance medication was reviewed in detail including emphasizing most importantly the difference between maintenance and prns and under what circumstances the prns are to be triggered using an action plan format where appropriate.  Total time for H and P, chart review, counseling, teaching how to titrate portable 02, use of nebs  and generating customized AVS unique to this office visit / charting = 45 min

## 2020-05-20 NOTE — Assessment & Plan Note (Signed)
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 Very unusual appearance of the lungs which is compatible with interstitial lung disease. 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 Accordingly, some of the interstitial lung changes could represent a manifestation of asbestosis. In particular, with the appearance in the lower lungs is favored to reflect nonspecific interstitial pneumonia (NSIP). However, the unusual appearance in the upper lungs which includes honeycombing could suggest a concurrent process such as chronic hypersensitivity pneumonitis.   Says she was never told she has asbestosis but finding on ct assoc with ca pleural plaques c/w this dx and would explain why she's been symptomatic but still alive x 20 y p onset and 02 dep for so long as well.   Suggested she let [facility] investigate this exposure more thoroughly and see if she is entitled to benefits but little else to offer here other than 02.

## 2020-05-25 ENCOUNTER — Telehealth: Payer: Self-pay | Admitting: Internal Medicine

## 2020-05-25 NOTE — Telephone Encounter (Signed)
Torsemide was prescribed by Dr. Carlyle Dolly. Pt will need to contact their office to get them to refill med. We do not have prednisone listed on pt's med list.  Called pt but unable to reach. Left message for her to return call.

## 2020-05-26 MED ORDER — PREDNISONE 10 MG PO TABS
ORAL_TABLET | ORAL | 0 refills | Status: DC
Start: 2020-05-26 — End: 2020-07-29

## 2020-05-26 NOTE — Telephone Encounter (Signed)
Called and spoke to [name] to let her know that Dr.Wert approved prednisone 10mg  for patient.Erica [name] voice was understanding

## 2020-05-26 NOTE — Telephone Encounter (Signed)
Prednisone 10 mg take  4 each am x 2 days,   2 each am x 2 days,  1 each am x 2 days and stop  

## 2020-05-26 NOTE — Telephone Encounter (Signed)
Erica [name] returning a phone call. Erica [name] can be reached at [phone]

## 2020-05-26 NOTE — Telephone Encounter (Signed)
Called and spoke with Rollene Fare letting her know that they will need to contact Dr. Nelly Laurence office in regards to having pt's torsemide refilled and she verbalized understanding.  While speaking with Rollene Fare, she was wanting to know if pt might be able to have some prednisone prescribed as she began to have a flare up of her COPD 2 days ago. Pt has increased SOB, chest congestion, and a nonproductive cough. Pt does not have any wheezing at this time. She stated when pt usually has a flareup and if has to go to the hospital due to it, they usually prescribe prednisone which helps.   Dr. Melvyn Novas, please advise.

## 2020-06-04 ENCOUNTER — Ambulatory Visit: Payer: Medicare HMO | Admitting: Cardiology

## 2020-06-29 ENCOUNTER — Telehealth: Payer: Self-pay | Admitting: *Deleted

## 2020-06-29 NOTE — Telephone Encounter (Signed)
Spoke with Rollene Fare, ok per DPR and notified of recs per MW  She verbalized understanding and will make sure pt brings all meds/devices

## 2020-06-29 NOTE — Telephone Encounter (Signed)
-----   Message from Tanda Rockers, MD sent at 05/20/2020  6:01 AM EDT ----- Remind must bring all inhalers/ devices to next ov (not the concentrator or the neb machine though)

## 2020-07-05 ENCOUNTER — Other Ambulatory Visit (HOSPITAL_COMMUNITY)
Admission: RE | Admit: 2020-07-05 | Discharge: 2020-07-05 | Disposition: A | Discharge status: Home / Self Care | Payer: Medicare Other | Source: Ambulatory Visit | Attending: Internal Medicine | Admitting: Internal Medicine

## 2020-07-05 NOTE — Progress Notes (Signed)
  Spoke with patient this morning, said she could be here at 2:15. Patient didn't show, spoke with pt.'s DPR she said patient has to rely on transportation. Said she had scheduled the pt. With a facility in [facility] where pt. Lives. We should be able to get the results by her appt. time for her PFT.

## 2020-07-06 ENCOUNTER — Ambulatory Visit (HOSPITAL_COMMUNITY)
Admission: RE | Admit: 2020-07-06 | Discharge: 2020-07-06 | Disposition: A | Discharge status: Home / Self Care | Payer: Medicare Other | Source: Ambulatory Visit | Attending: Internal Medicine | Admitting: Internal Medicine

## 2020-07-06 NOTE — Progress Notes (Signed)
Spoke with patient this morning and again this afternoon.   She still does not have her Covid results from the the facility in [place].  She will call the main Respiratory  office at [facility] [phone] for reschedule her PFT appt.

## 2020-07-07 ENCOUNTER — Telehealth: Payer: Self-pay | Admitting: Internal Medicine

## 2020-07-07 NOTE — Telephone Encounter (Signed)
Spoke with Erica [name] and provided her with the number to [facility] to reschedule PFT. Number for the patient to call is [phone]. She expressed understanding and provided her with number. Nothing further needed at this time.

## 2020-07-07 NOTE — Telephone Encounter (Signed)
LMTCB for [employer]

## 2020-07-13 ENCOUNTER — Ambulatory Visit: Payer: Medicare Other | Admitting: Internal Medicine

## 2020-07-14 ENCOUNTER — Other Ambulatory Visit (HOSPITAL_COMMUNITY): Payer: Self-pay | Admitting: Nephrology

## 2020-07-14 DIAGNOSIS — Z79899 Other long term (current) drug therapy: Secondary | ICD-10-CM

## 2020-07-14 DIAGNOSIS — D638 Anemia in other chronic diseases classified elsewhere: Secondary | ICD-10-CM

## 2020-07-14 DIAGNOSIS — E873 Alkalosis: Secondary | ICD-10-CM

## 2020-07-14 DIAGNOSIS — I5033 Acute on chronic diastolic (congestive) heart failure: Secondary | ICD-10-CM

## 2020-07-14 DIAGNOSIS — I272 Pulmonary hypertension, unspecified: Secondary | ICD-10-CM

## 2020-07-14 DIAGNOSIS — N184 Chronic kidney disease, stage 4 (severe): Secondary | ICD-10-CM

## 2020-07-20 ENCOUNTER — Other Ambulatory Visit: Payer: Self-pay

## 2020-07-20 ENCOUNTER — Ambulatory Visit (HOSPITAL_COMMUNITY)
Admission: RE | Admit: 2020-07-20 | Discharge: 2020-07-20 | Disposition: A | Discharge status: Home / Self Care | Payer: Medicare Other | Source: Ambulatory Visit | Attending: Nephrology | Admitting: Nephrology

## 2020-07-20 DIAGNOSIS — E873 Alkalosis: Secondary | ICD-10-CM | POA: Diagnosis present

## 2020-07-20 DIAGNOSIS — Z79899 Other long term (current) drug therapy: Secondary | ICD-10-CM | POA: Diagnosis present

## 2020-07-20 DIAGNOSIS — N184 Chronic kidney disease, stage 4 (severe): Secondary | ICD-10-CM | POA: Diagnosis not present

## 2020-07-20 DIAGNOSIS — I5033 Acute on chronic diastolic (congestive) heart failure: Secondary | ICD-10-CM | POA: Diagnosis present

## 2020-07-20 DIAGNOSIS — I272 Pulmonary hypertension, unspecified: Secondary | ICD-10-CM | POA: Diagnosis present

## 2020-07-20 DIAGNOSIS — D638 Anemia in other chronic diseases classified elsewhere: Secondary | ICD-10-CM

## 2020-07-27 ENCOUNTER — Ambulatory Visit (HOSPITAL_COMMUNITY)
Admission: RE | Admit: 2020-07-27 | Discharge: 2020-07-27 | Disposition: A | Discharge status: Home / Self Care | Payer: Medicare Other | Source: Ambulatory Visit | Attending: Internal Medicine | Admitting: Internal Medicine

## 2020-07-27 ENCOUNTER — Other Ambulatory Visit: Payer: Self-pay

## 2020-07-27 DIAGNOSIS — J9611 Chronic respiratory failure with hypoxia: Secondary | ICD-10-CM | POA: Diagnosis present

## 2020-07-27 DIAGNOSIS — J449 Chronic obstructive pulmonary disease, unspecified: Secondary | ICD-10-CM | POA: Diagnosis present

## 2020-07-27 DIAGNOSIS — J61 Pneumoconiosis due to asbestos and other mineral fibers: Secondary | ICD-10-CM | POA: Diagnosis present

## 2020-07-27 DIAGNOSIS — J9612 Chronic respiratory failure with hypercapnia: Secondary | ICD-10-CM | POA: Insufficient documentation

## 2020-07-27 LAB — PULMONARY FUNCTION TEST
DL/VA % pred: 33 %
DL/VA: 1.39 ml/min/mmHg/L
DLCO unc % pred: 14 %
DLCO unc: 2.82 ml/min/mmHg
FEF 25-75 Post: 1.02 L/sec
FEF 25-75 Pre: 0.89 L/sec
FEF2575-%Change-Post: 14 %
FEF2575-%Pred-Post: 61 %
FEF2575-%Pred-Pre: 54 %
FEV1-%Change-Post: 2 %
FEV1-%Pred-Post: 54 %
FEV1-%Pred-Pre: 52 %
FEV1-Post: 0.98 L
FEV1-Pre: 0.95 L
FEV1FVC-%Change-Post: 1 %
FEV1FVC-%Pred-Pre: 106 %
FEV6-%Change-Post: -1 %
FEV6-%Pred-Post: 50 %
FEV6-%Pred-Pre: 51 %
FEV6-Post: 1.14 L
FEV6-Pre: 1.16 L
FEV6FVC-%Change-Post: 0 %
FEV6FVC-%Pred-Post: 103 %
FEV6FVC-%Pred-Pre: 104 %
FVC-%Change-Post: 0 %
FVC-%Pred-Post: 49 %
FVC-%Pred-Pre: 49 %
FVC-Post: 1.17 L
FVC-Pre: 1.16 L
Post FEV1/FVC ratio: 84 %
Post FEV6/FVC ratio: 99 %
Pre FEV1/FVC ratio: 82 %
Pre FEV6/FVC Ratio: 100 %
RV % pred: 69 %
RV: 1.54 L
TLC % pred: 55 %
TLC: 2.81 L

## 2020-07-27 MED ORDER — ALBUTEROL SULFATE (2.5 MG/3ML) 0.083% IN NEBU
2.5000 mg | INHALATION_SOLUTION | Freq: Once | RESPIRATORY_TRACT | Status: AC
  Administered 2020-07-27: 2.5 mg via RESPIRATORY_TRACT

## 2020-07-29 ENCOUNTER — Other Ambulatory Visit: Payer: Self-pay

## 2020-07-29 ENCOUNTER — Encounter: Payer: Self-pay | Admitting: Internal Medicine

## 2020-07-29 ENCOUNTER — Ambulatory Visit (INDEPENDENT_AMBULATORY_CARE_PROVIDER_SITE_OTHER): Payer: Medicare Other | Admitting: Internal Medicine

## 2020-07-29 DIAGNOSIS — J61 Pneumoconiosis due to asbestos and other mineral fibers: Secondary | ICD-10-CM

## 2020-07-29 DIAGNOSIS — J9612 Chronic respiratory failure with hypercapnia: Secondary | ICD-10-CM | POA: Diagnosis not present

## 2020-07-29 DIAGNOSIS — J9611 Chronic respiratory failure with hypoxia: Secondary | ICD-10-CM | POA: Diagnosis not present

## 2020-07-29 DIAGNOSIS — J449 Chronic obstructive pulmonary disease, unspecified: Secondary | ICD-10-CM | POA: Diagnosis not present

## 2020-07-29 MED ORDER — STIOLTO RESPIMAT 2.5-2.5 MCG/ACT IN AERS: 2.0000 | INHALATION_SPRAY | Freq: Every day | RESPIRATORY_TRACT | 11 refills | Status: DC

## 2020-07-29 MED ORDER — STIOLTO RESPIMAT 2.5-2.5 MCG/ACT IN AERS
2.0000 | INHALATION_SPRAY | Freq: Every day | RESPIRATORY_TRACT | 0 refills | Status: DC
Start: 2020-07-29 — End: 2020-08-26

## 2020-07-29 NOTE — Progress Notes (Signed)
Erica [name], female    DOB: 03-03-1951,   MRN: [id number]   Brief patient profile:  41 yobf still smoking despite 02 dep x 2011 with h/o asbestos exp in the late 1970's ? 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 Follow up with PCP in 1-2 weeks 2. Please obtain BMP/CBC in one week 3. Continue to hold Coreg, Diovan, and Norvasc until seen in clinic and blood pressures are noted to improve 4. May resume torsemide starting 7/7 at 20 mg dose Monday Wednesday Friday as recommended by nephrology 5. Patient will be scheduled for outpatient nephrology visit 6. Patient will be scheduled for outpatient pulmonology visit due to concern for interstitial lung disease 7. Continue on prednisone 40 mg daily as scheduled for the next 5 days 8. Continue home breathing treatments as needed for any shortness of breath or wheezing 9. Remain on aspirin, Plavix, and statin follow-up with cardiologist Dr. Harl Bowie as requested in 2 weeks  Home Health: None  Equipment/Devices: Has home nasal cannula 3 L-chronic  Discharge Condition: Stable  CODE STATUS: Full  Diet recommendation: Heart Healthy/carb modified  Brief/Interim Summary: Per HPI: [name] a50 y.o.female,with history of stroke, HTN, HLD, DMII, COPD, CHF and more presents to the ED with dyspnea. Patient reports onset was one day PTA. Patient reports that it has been progressively worse. The dyspnea is worse on exertion, and better with rest. She reports orthopnea at home and palpitations. She has no chest pain, diaphoresis, or nausea. She has no history of MI. She is a current smoker. She has associated swelling. Patient reports cough that is productive of thick yellow sputum. She has not had any fevers or body aches. No ill contacts.  Patient reports decrease in urine output. Her last void was the AM of 03/27/20. It was non painful, non bloody, and there was not malodorous. Patient reports that she has been constipated. She has tried to take linzess, without relief.  Acute combined hypercapnic and hypoxemic respiratory failure-multifactorial-resolved -Appears to be related to some diastolic CHF exacerbation as well as COPD, but predominantly more COPD exacerbation  Acute diastolic CHF exacerbation-resolved -Back to baseline level of oxygen requirement -2D echocardiogramon 7/4 with severe RV dysfunction and moderate pulmonary hypertension noted with LVEF 60-65%. Hypokinesis noted in left ventricle as well. May require invasive testing given these findings.  -We will follow-up with cardiology in outpatient setting -Cardiology recommends follow-up with pulmonology due to suspicion of interstitial lung disease with no recommendations for invasive cardiac testing at this time in light of CKD stage IV and high risk of contrast nephropathy -May require right heart catheterization in the near future to assist with management of pulmonary hypertension  Acute COPD exacerbation-improved -Wears chronic 3-4 L nasal cannula oxygen at home -Continue on azithromycin for 1 more day as prescribed to complete 5-day course of treatment -Continue on prednisone 40 mg daily for next 5 days -Continue home breathing treatments as needed -We will schedule with pulmonology in [place] for further follow-up, also with concern for interstitial lung disease  History of hypertension -Currently with some lower blood pressure readings after diuresis -Stable for discharge and will hold home Coreg, Diovan, Norvasc for now -Follow-up with PCP and reassess blood pressure readings and resume medications as appropriate  Chronic tobacco use -Nicotine patch declined on discharge -  Counseled on cessation  Troponin elevation -No chest pain or signs  of ACS noted -Monitor on telemetry -Likely demand ischemia -Significant findings as noted above on 2D echocardiogram with cardiology recommendations as noted to follow-up with pulmonology and consider right heart catheterization in the near future  Stage IV CKD  -Appreciate nephrology assistancewith diuresis -Nephrology will schedule for outpatient follow-up visit -Continue torsemide 20 mg on Monday Wednesday and Friday to resume on 7/7  Discharge Diagnoses:  Active Problems:   Acute respiratory failure with hypoxia [facility])      History of Present Illness  05/19/2020  Pulmonary/ 1st office eval/Erica [name] post hosp "aecopd"  With known ILD / 02 dep  Chief Complaint  Patient presents with   Pulmonary Consult    Referred by Dr Heath Lark. Pt c/o DOE that started back in 2001- worse over the past year. She states gets winded walking approx 25 ft. She has been using o2 "for years"   Dyspnea:  Across the room even on 4lpm does not check sats  Cough: none  Sleep: able to lie on side bed is flat 2 pillows  SABA use: duoneb first thing in am and "good for the whole day" 02 conc 4lpm  rec The key is to stop smoking completely before smoking completely stops you! The goal is to keep saturations above 90% at all time  [facility] is the asbestos specialist in [place]  Please schedule a follow up office visit in 6 weeks, call sooner if needed with pfts on return  Add:  Bring all inhalers/ devices to next visit    07/29/2020  f/u ov/[place] office/Erica [name] re: PF c/w asbestosis vs NSIP, copd still smoking/ 0 2 dep  Chief Complaint  Patient presents with   Follow-up    shortness of breath  Dyspnea:  Across the  Room on 4lpm not checking sats Cough: none  Sleeping: flat pillows on side 2 pillows  SABA use: none 02: 4lpm 24/7  Worse leg swelling since ran out of demadex   No obvious day to day or daytime variability or assoc excess/ purulent sputum or mucus plugs or hemoptysis  or cp or chest tightness, subjective wheeze or overt sinus or hb symptoms.   Sleeping as above without nocturnal  or early am exacerbation  of respiratory  c/o's or need for noct saba. Also denies any obvious fluctuation of symptoms with weather or environmental changes or other aggravating or alleviating factors except as outlined above   No unusual exposure hx or h/o childhood pna/ asthma or knowledge of premature birth.  Current Allergies, Complete Past Medical History, Past Surgical History, Family History, and Social History were reviewed in [employer] record.  ROS  The following are not active complaints unless bolded Hoarseness, sore throat, dysphagia, dental problems, itching, sneezing,  nasal congestion or discharge of excess mucus or purulent secretions, ear ache,   fever, chills, sweats, unintended wt loss or wt gain, classically pleuritic or exertional cp,  orthopnea pnd or arm/hand swelling  or leg swelling, presyncope, palpitations, abdominal pain, anorexia, nausea, vomiting, diarrhea  or change in bowel habits or change in bladder habits, change in stools or change in urine, dysuria, hematuria,  rash, arthralgias, visual complaints, headache, numbness, weakness or ataxia or problems with walking or coordination,  change in mood or  memory.        Current Meds  Medication Sig   albuterol (VENTOLIN HFA) 108 (90 Base) MCG/ACT inhaler Inhale 1 puff into the  lungs every 6 (six) hours as needed for shortness of breath.   Amino Acids-Protein Hydrolys (FEEDING SUPPLEMENT, PRO-STAT SUGAR FREE 64,) LIQD Take 30 mLs by mouth 2 (two) times daily.   amLODipine (NORVASC) 10 MG tablet Take 1 tablet by mouth.   famotidine (PEPCID) 20 MG tablet Take 20 mg by mouth daily.    guaiFENesin (MUCINEX) 600 MG 12 hr tablet Take 600 mg by mouth 2 (two) times daily as needed.   ipratropium-albuterol (DUONEB) 0.5-2.5 (3) MG/3ML SOLN Take 3 mLs by nebulization every 6 (six) hours  as needed (for shortness of breath).   Linaclotide (LINZESS) 145 MCG CAPS capsule Take 145 mcg by mouth daily as needed (for constipation).    omega-3 acid ethyl esters (LOVAZA) 1 g capsule Take 1 g by mouth daily.    pantoprazole (PROTONIX) 40 MG tablet Take 1 tablet (40 mg total) by mouth daily.   SPIRIVA HANDIHALER 18 MCG inhalation capsule Take 1 puff by mouth daily.   SYMBICORT 80-4.5 MCG/ACT inhaler Inhale 2 puffs into the lungs 2 (two) times daily.   torsemide (DEMADEX) 20 MG tablet Take 1 tablet (20 mg total) by mouth daily. Take 1 tablet on Monday, Wednesday, and Friday only.                     Past Medical History:  Diagnosis Date   Arthritis    Chronic back pain    CKD (chronic kidney disease), stage IV (HCC)    COPD (chronic obstructive pulmonary disease) (HCC)    2L home O2   Diastolic heart failure (HCC)    Essential hypertension    History of GI bleed    Suspected AVMs   History of stroke    Hyperlipidemia    Interstitial lung disease (HCC)    PSVT (paroxysmal supraventricular tachycardia) (HCC)    Type 2 diabetes mellitus (HCC)        Objective:    W/c bound elderly bf nad    Wt Readings from Last 3 Encounters:  07/29/20 134 lb 9.6 oz (61.1 kg)  05/19/20 131 lb (59.4 kg)  03/30/20 139 lb 1.8 oz (63.1 kg)     Vital signs reviewed - Note on arrival 07/29/2020  02 sats  95% on 4lpm            HEENT : pt wearing mask not removed for exam due to covid - 19 concerns.   NECK :  without JVD/Nodes/TM/ nl carotid upstrokes bilaterally   LUNGS: no acc muscle use,  Min barrel  contour chest wall with bilateral coarse  insp crackles  and  without cough on insp or exp maneuvers and min  Hyperresonant  to  percussion bilaterally     CV:  RRR  no s3 or murmur or increase in P2, and 2+ pitting both LE's  ABD:  soft and nontender with pos end  insp Hoover's  in the supine position. No bruits or organomegaly appreciated, bowel sounds  nl  MS:   Nl gait/  ext warm without deformities, calf tenderness, cyanosis  - min  clubbing No obvious joint restrictions   SKIN: warm and dry without lesions    NEURO:  alert, approp, nl sensorium with  no motor or cerebellar deficits apparent.                      Assessment

## 2020-07-29 NOTE — Patient Instructions (Addendum)
The key is to stop smoking completely before smoking completely stops you!  Make sure you check your oxygen saturations at highest level of activity to be sure it stays over 90% and adjust  02 flow upward to maintain this level if needed but remember to turn it back to previous settings when you stop (to conserve your supply).   Stop symbicort and spiriva   Start Stiolto 2 pffs each am   Work on inhaler technique:  relax and gently blow all the way out then take a nice smooth deep breath back in, triggering the inhaler at same time you start breathing in.  Hold for up to 5 seconds if you can.   Rinse and gargle with water when done      Only use your albuterol (ventolin) as a rescue medication to be used if you can't catch your breath by resting or doing a relaxed purse lip breathing pattern.  - The less you use it, the better it will work when you need it. - Ok to use up to 2 puffs  every 4 hours if you must but call for immediate appointment if use goes up over your usual need - Don't leave home without it !!  (think of it like the spare tire for your car)  `   Please schedule a follow up office visit in 4 weeks, sooner if needed - bring all inhalers and nebulizer solutions with you

## 2020-07-30 ENCOUNTER — Encounter: Payer: Self-pay | Admitting: Internal Medicine

## 2020-07-30 NOTE — Assessment & Plan Note (Signed)
Active smoker - HRCT 3/272019 mild diffuse bronchial wall thickening with mild centrilobular and paraseptal emphysema; imaging findings suggestive of underlying COPD. - PFT's  07/27/20   Tds with f/v loop only mildly concave  - 07/29/2020  After extensive coaching inhaler device,  effectiveness =    75% from a baseline of nearly 0 >  Try stiolto 2 pffs each am x 4 week samples  Unable to stage copd due to technically difficult pfts but she does have mildly concave f/v c/w copd and Pt is Group B in terms of symptom/risk and laba/lama therefore appropriate rx at this point >>>  Try stiolto and f/u in 4 weeks

## 2020-07-30 NOTE — Assessment & Plan Note (Signed)
HC03  04/19/20  = 33  - rx as of 07/29/2020  4 lpm concentrator and titrate with activity to keep > 90%   Advised: Make sure you check your oxygen saturations at highest level of activity to be sure it stays over 90% and adjust  02 flow upward to maintain this level if needed but remember to turn it back to previous settings when you stop (to conserve your supply).

## 2020-07-30 NOTE — Assessment & Plan Note (Signed)
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 Very unusual appearance of the lungs which is compatible with interstitial lung disease. 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 Accordingly, some of the interstitial lung changes could represent a manifestation of asbestosis. In particular, with the appearance in the lower lungs is favored to reflect nonspecific interstitial pneumonia (NSIP). However, the unusual appearance in the upper lungs which includes honeycombing could suggest a concurrent process such as chronic hypersensitivity pneumonitis.  - PFTs 07/27/20  TDS but probable severe restriction,min obstruction  Will do HSP/collagen vasc profiles  on return to complete the w/u but the overall picture is most c/w asbestosis with only rx for now = adequate 02 rx and strongly advised stop all smoking   Also referred to [facility] to see if she can be compensated for her disease.      Each maintenance medication was reviewed in detail including emphasizing most importantly the difference between maintenance and prns and under what circumstances the prns are to be triggered using an action plan format where appropriate.  Total time for H and P, chart review, counseling, teaching device and generating customized AVS unique to this office visit / charting = 32 min

## 2020-08-26 ENCOUNTER — Other Ambulatory Visit: Payer: Self-pay

## 2020-08-26 ENCOUNTER — Ambulatory Visit (INDEPENDENT_AMBULATORY_CARE_PROVIDER_SITE_OTHER): Payer: Medicare Other | Admitting: Internal Medicine

## 2020-08-26 ENCOUNTER — Encounter: Payer: Self-pay | Admitting: Internal Medicine

## 2020-08-26 DIAGNOSIS — F17210 Nicotine dependence, cigarettes, uncomplicated: Secondary | ICD-10-CM | POA: Diagnosis not present

## 2020-08-26 DIAGNOSIS — J9611 Chronic respiratory failure with hypoxia: Secondary | ICD-10-CM

## 2020-08-26 DIAGNOSIS — J9612 Chronic respiratory failure with hypercapnia: Secondary | ICD-10-CM

## 2020-08-26 DIAGNOSIS — J61 Pneumoconiosis due to asbestos and other mineral fibers: Secondary | ICD-10-CM | POA: Diagnosis not present

## 2020-08-26 DIAGNOSIS — J449 Chronic obstructive pulmonary disease, unspecified: Secondary | ICD-10-CM

## 2020-08-26 MED ORDER — STIOLTO RESPIMAT 2.5-2.5 MCG/ACT IN AERS: 2.0000 | INHALATION_SPRAY | Freq: Every day | RESPIRATORY_TRACT | 11 refills | Status: AC

## 2020-08-26 NOTE — Patient Instructions (Signed)
Goal is to keep 02 the 02 sats over 90% at all times   Plan A = Automatic = Always=    stiolto 2 puffs first thing in am   Plan B = Backup (to supplement plan A, not to replace it) Only use your albuterol inhaler as a rescue medication to be used if you can't catch your breath by resting or doing a relaxed purse lip breathing pattern.  - The less you use it, the better it will work when you need it. - Ok to use the inhaler up to 2 puffs  every 4 hours if you must but call for appointment if use goes up over your usual need - Don't leave home without it !!  (think of it like the spare tire for your car)   Plan C = Crisis (instead of Plan B but only if Plan B stops working) - only use your albuterol nebulizer if you first try Plan B and it fails to help > ok to use the nebulizer up to every 4 hours but if start needing it regularly call for immediate appointment   For cough > mucinex up to 1200 mg every 12 hours as needed   The key is to stop smoking completely before smoking completely stops you!     Please schedule a follow up office visit in 6  monts , call sooner if needed

## 2020-08-26 NOTE — Progress Notes (Signed)
Erica [name], female    DOB: 03/31/1951,   MRN: [id number]   Brief patient profile:  17 yobf still smoking despite 02 dep x 2011 with h/o asbestos exp in the late 1970's ? 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 Follow up with PCP in 1-2 weeks 2. Please obtain BMP/CBC in one week 3. Continue to hold Coreg, Diovan, and Norvasc until seen in clinic and blood pressures are noted to improve 4. May resume torsemide starting 7/7 at 20 mg dose Monday Wednesday Friday as recommended by nephrology 5. Patient will be scheduled for outpatient nephrology visit 6. Patient will be scheduled for outpatient pulmonology visit due to concern for interstitial lung disease 7. Continue on prednisone 40 mg daily as scheduled for the next 5 days 8. Continue home breathing treatments as needed for any shortness of breath or wheezing 9. Remain on aspirin, Plavix, and statin follow-up with cardiologist Dr. Harl Bowie as requested in 2 weeks  Home Health: None  Equipment/Devices: Has home nasal cannula 3 L-chronic  Discharge Condition: Stable  CODE STATUS: Full  Diet recommendation: Heart Healthy/carb modified  Brief/Interim Summary: Per HPI: [name] a75 y.o.female,with history of stroke, HTN, HLD, DMII, COPD, CHF and more presents to the ED with dyspnea. Patient reports onset was one day PTA. Patient reports that it has been progressively worse. The dyspnea is worse on exertion, and better with rest. She reports orthopnea at home and palpitations. She has no chest pain, diaphoresis, or nausea. She has no history of MI. She is a current smoker. She has associated swelling. Patient reports cough that is productive of thick yellow sputum. She has not had any fevers or body aches. No ill contacts.  Patient reports decrease in urine output. Her last void was the AM of 03/27/20. It was non painful, non bloody, and there was not malodorous. Patient reports that she has been constipated. She has tried to take linzess, without relief.  Acute combined hypercapnic and hypoxemic respiratory failure-multifactorial-resolved -Appears to be related to some diastolic CHF exacerbation as well as COPD, but predominantly more COPD exacerbation  Acute diastolic CHF exacerbation-resolved -Back to baseline level of oxygen requirement -2D echocardiogramon 03/28/20 with severe RV dysfunction and moderate pulmonary hypertension noted with LVEF 60-65%. Hypokinesis noted in left ventricle as well. May require invasive testing given these findings.  -We will follow-up with cardiology in outpatient setting -Cardiology recommends follow-up with pulmonology due to suspicion of interstitial lung disease with no recommendations for invasive cardiac testing at this time in light of CKD stage IV and high risk of contrast nephropathy -May require right heart catheterization in the near future to assist with management of pulmonary hypertension  Acute COPD exacerbation-improved -Wears chronic 3-4 L nasal cannula oxygen at home -Continue on azithromycin for 1 more day as prescribed to complete 5-day course of treatment -Continue on prednisone 40 mg daily for next 5 days -Continue home breathing treatments as needed -We will schedule with pulmonology in [place] for further follow-up, also with concern for interstitial lung disease  History of hypertension -Currently with some lower blood pressure readings after diuresis -Stable for discharge and will hold home Coreg, Diovan, Norvasc for now -Follow-up with PCP and reassess blood pressure readings and resume medications as appropriate  Chronic tobacco use -Nicotine patch declined on discharge -  Counseled on cessation  Troponin elevation -No chest pain or  signs of ACS noted -Monitor on telemetry -Likely demand ischemia -Significant findings as noted above on 2D echocardiogram with cardiology recommendations as noted to follow-up with pulmonology and consider right heart catheterization in the near future  Stage IV CKD  -Appreciate nephrology assistancewith diuresis -Nephrology will schedule for outpatient follow-up visit -Continue torsemide 20 mg on Monday Wednesday and Friday to resume on 7/7  Discharge Diagnoses:  Active Problems:   Acute respiratory failure with hypoxia [facility])      History of Present Illness  05/19/2020  Pulmonary/ 1st office eval/Lucio Litsey post hosp "aecopd"  With known ILD / 02 dep  Chief Complaint  Patient presents with  . Pulmonary Consult    Referred by Dr Heath Lark. Pt c/o DOE that started back in 2001- worse over the past year. She states gets winded walking approx 25 ft. She has been using o2 "for years"   Dyspnea:  Across the room even on 4lpm does not check sats  Cough: none  Sleep: able to lie on side bed is flat 2 pillows  SABA use: duoneb first thing in am and "good for the whole day" 02 conc 4lpm  rec The key is to stop smoking completely before smoking completely stops you! The goal is to keep saturations above 90% at all time  [facility] is the asbestos specialist in [employer]  Please schedule a follow up office visit in 6 weeks, call sooner if needed with pfts on return  Add:  Bring all inhalers/ devices to next visit    07/29/2020  f/u ov/[place] office/Charley Lafrance re: PF c/w asbestosis vs NSIP, copd still smoking/ 0 2 dep  Chief Complaint  Patient presents with  . Follow-up    shortness of breath  Dyspnea:  Across the  Room on 4lpm not checking sats Cough: none  Sleeping: flat pillows on side 2 pillows  SABA use: none 02: 4lpm 24/7  Worse leg swelling since ran out of demadex rec The key is to stop smoking completely before smoking completely stops you! Make sure you check your  oxygen saturations at highest level of activity to be sure it stays over 90% and adjust  02 flow upward to   Stop symbicort and spiriva  Start Stiolto 2 pffs each am  Work on inhaler technique:   Only use your albuterol (ventolin) as a rescue medication  Please schedule a follow up office visit in 4 weeks, sooner if needed - bring all inhalers and nebulizer solutions with you     08/26/2020  f/u ov/ office/Chrishawna Farina re: asbestosis/ chronic resp failure complicated by Cor pulmonale  /still smoking   Chief Complaint  Patient presents with  . Follow-up    Breathing has been worse the past wk and coughing more with thick, white sputum.  Dyspnea:  Room to room 4lpm when walking but does not check sats  Cough: rattle with white mucus esp in am  Sleeping: L side down/ bed is flat/ 2 pillows SABA use: nebulizer twice daily  02: 4lpm "most of the time"   No obvious day to day or daytime variability or assoc excess/ purulent sputum or mucus plugs or hemoptysis or cp or chest tightness, subjective wheeze or overt sinus or hb symptoms.   Sleeping  without nocturnal  or early am exacerbation  of respiratory  c/o's or need for noct saba. Also denies any obvious fluctuation of symptoms with weather or environmental changes or  other aggravating or alleviating factors except as outlined above   No unusual exposure hx or h/o childhood pna/ asthma or knowledge of premature birth.  Current Allergies, Complete Past Medical History, Past Surgical History, Family History, and Social History were reviewed in [employer] record.  ROS  The following are not active complaints unless bolded Hoarseness, sore throat, dysphagia, dental problems, itching, sneezing,  nasal congestion or discharge of excess mucus or purulent secretions, ear ache,   fever, chills, sweats, unintended wt loss or wt gain, classically pleuritic or exertional cp,  orthopnea pnd or arm/hand swelling  or leg swelling,  presyncope, palpitations, abdominal pain, anorexia, nausea, vomiting, diarrhea  or change in bowel habits or change in bladder habits, change in stools or change in urine, dysuria, hematuria,  rash, arthralgias, visual complaints, headache, numbness, weakness or ataxia or problems with walking or coordination,  change in mood or  memory.        Current Meds  Medication Sig  . albuterol (VENTOLIN HFA) 108 (90 Base) MCG/ACT inhaler Inhale 1 puff into the lungs every 6 (six) hours as needed for shortness of breath.  . Amino Acids-Protein Hydrolys (FEEDING SUPPLEMENT, PRO-STAT SUGAR FREE 64,) LIQD Take 30 mLs by mouth 2 (two) times daily.  Marland Kitchen amLODipine (NORVASC) 10 MG tablet Take 1 tablet by mouth.  . famotidine (PEPCID) 20 MG tablet Take 20 mg by mouth daily.   . ferrous sulfate 325 (65 FE) MG EC tablet Take 1 tablet (325 mg total) by mouth 2 (two) times daily.  Marland Kitchen guaiFENesin (MUCINEX) 600 MG 12 hr tablet Take 600 mg by mouth 2 (two) times daily as needed.  Marland Kitchen ipratropium-albuterol (DUONEB) 0.5-2.5 (3) MG/3ML SOLN Take 3 mLs by nebulization every 6 (six) hours as needed (for shortness of breath).  . Linaclotide (LINZESS) 145 MCG CAPS capsule Take 145 mcg by mouth daily as needed (for constipation).   Marland Kitchen omega-3 acid ethyl esters (LOVAZA) 1 g capsule Take 1 g by mouth daily.   . pantoprazole (PROTONIX) 40 MG tablet Take 1 tablet (40 mg total) by mouth daily.  . Tiotropium Bromide-Olodaterol (STIOLTO RESPIMAT) 2.5-2.5 MCG/ACT AERS Inhale 2 puffs into the lungs daily.  Marland Kitchen torsemide (DEMADEX) 20 MG tablet Take 40 mg by mouth every Monday, Wednesday, and Friday.              Past Medical History:  Diagnosis Date  . Arthritis   . Chronic back pain   . CKD (chronic kidney disease), stage IV ([place])   . COPD (chronic obstructive pulmonary disease) (H[facility])    2L home O2  . Diastolic heart failure ([place])   . Essential hypertension   . History of GI bleed    Suspected AVMs  . History of stroke   .  Hyperlipidemia   . Interstitial lung disease ([place])   . PSVT (paroxysmal supraventricular tachycardia) ([place])   . Type 2 diabetes mellitus (HCC)        Objective:       w/c bound elderly bf nad     08/26/2020      Reports  136   07/29/20 134 lb 9.6 oz (61.1 kg)  05/19/20 131 lb (59.4 kg)  03/30/20 139 lb 1.8 oz (63.1 kg)     Vital signs reviewed  08/26/2020  - Note at rest 02 sats  89% on 4lpm        HEENT : pt wearing mask not removed for exam due to covid - 19 concerns.  NECK :  without JVD/Nodes/TM/ nl carotid upstrokes bilaterally   LUNGS: no acc muscle use,  Min barrel  contour chest wall with bilateral  coarse insp crackles  and  without cough on insp or exp maneuvers and min  Hyperresonant  to  percussion bilaterally     CV:  RRR  no s3 or murmur or increase in P2, and 1+ pitting bothLE's  ABD:  soft and nontender with pos end  insp Hoover's  in the supine position. No bruits or organomegaly appreciated, bowel sounds nl  MS:     ext warm without deformities, calf tenderness, cyanosis.  Min   clubbing No obvious joint restrictions   SKIN: warm and dry without lesions    NEURO:  alert, approp, nl sensorium with  no motor or cerebellar deficits apparent.               Assessment

## 2020-08-27 ENCOUNTER — Encounter: Payer: Self-pay | Admitting: Internal Medicine

## 2020-08-27 NOTE — Assessment & Plan Note (Signed)
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 Very unusual appearance of the lungs which is compatible with interstitial lung disease. 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 Accordingly, some of the interstitial lung changes could represent a manifestation of asbestosis. In particular, with the appearance in the lower lungs is favored to reflect nonspecific interstitial pneumonia (NSIP). However, the unusual appearance in the upper lungs which includes honeycombing could suggest a concurrent process such as chronic hypersensitivity pneumonitis.  - PFTs 07/27/20  TDS but probable severe restriction,min obstruction  She has no risk factors for HSP (most common is bird exposure in this setting) but if w/u needed for medicolegal reasons can certainly send the labs off.  In meantime really needs to focus on taking  02 consistently as well as meds and working on eliminating all smoking (see separate a/p)    >>> f/u in 6 m, call sooner if needed

## 2020-08-27 NOTE — Assessment & Plan Note (Addendum)
2D echocardiogramon 03/28/20 with severe RV dysfunction and moderate pulmonary hypertension noted with LVEF 60-65%. Hypokinesis noted in left ventricle as well - HC03  04/19/20  = 33  - rx as of 07/29/2020  4 lpm concentrator and titrate with activity to keep > 90%  02 sats around 90% all that's needed here, advised on titration up with activities.  rx for cor pulmonale is treat the underlying problem to the extent it can be corrected with diuretics per PCP

## 2020-08-27 NOTE — Assessment & Plan Note (Addendum)
Counseled re importance of smoking cessation but did not meet time criteria for separate billing   °

## 2020-08-27 NOTE — Assessment & Plan Note (Signed)
Active smoker - HRCT 3/272019 mild diffuse bronchial wall thickening with mild centrilobular and paraseptal emphysema; imaging findings suggestive of underlying COPD. - PFT's  07/27/20   Tds with f/v loop only mildly concave  - 07/29/2020     Try stiolto 2 pffs each am x 4 week samples - 08/26/2020  After extensive coaching inhaler device,  effectiveness =    90% > continue stiolto   She has very little copd but  Group B in terms of symptom/risk and laba/lama therefore appropriate rx at this point >>>  Continue stiolto and approp saba:  I spent extra time with pt today reviewing appropriate use of albuterol for prn use on exertion with the following points: 1) saba is for relief of sob that does not improve by walking a slower pace or resting but rather if the pt does not improve after trying this first. 2) If the pt is convinced, as many are, that saba helps recover from activity faster then it's easy to tell if this is the case by re-challenging : ie stop, take the inhaler, then p 5 minutes try the exact same activity (intensity of workload) that just caused the symptoms and see if they are substantially diminished or not after saba 3) if there is an activity that reproducibly causes the symptoms, try the saba 15 min before the activity on alternate days   If in fact the saba really does help, then fine to continue to use it prn but advised may need to look closer at the maintenance regimen being used to achieve better control of airways disease with exertion.           Each maintenance medication was reviewed in detail including emphasizing most importantly the difference between maintenance and prns and under what circumstances the prns are to be triggered using an action plan format where appropriate.  Total time for H and P, chart review, counseling, teaching device and generating customized AVS unique to this office visit / charting = 30 min

## 2020-08-28 IMAGING — MG MM DIGITAL DIAGNOSTIC UNILAT*R* W/ TOMO W/ CAD
4 series · 4 of 12 positions shown · non-contrast
Comparison: January 05, 2020

CLINICAL DATA: Possible mass right breast identified on recent
screening mammogram.

EXAM:
DIGITAL DIAGNOSTIC RIGHT MAMMOGRAM WITH CAD AND TOMO
ULTRASOUND RIGHT BREAST

[R CC synth-2D]
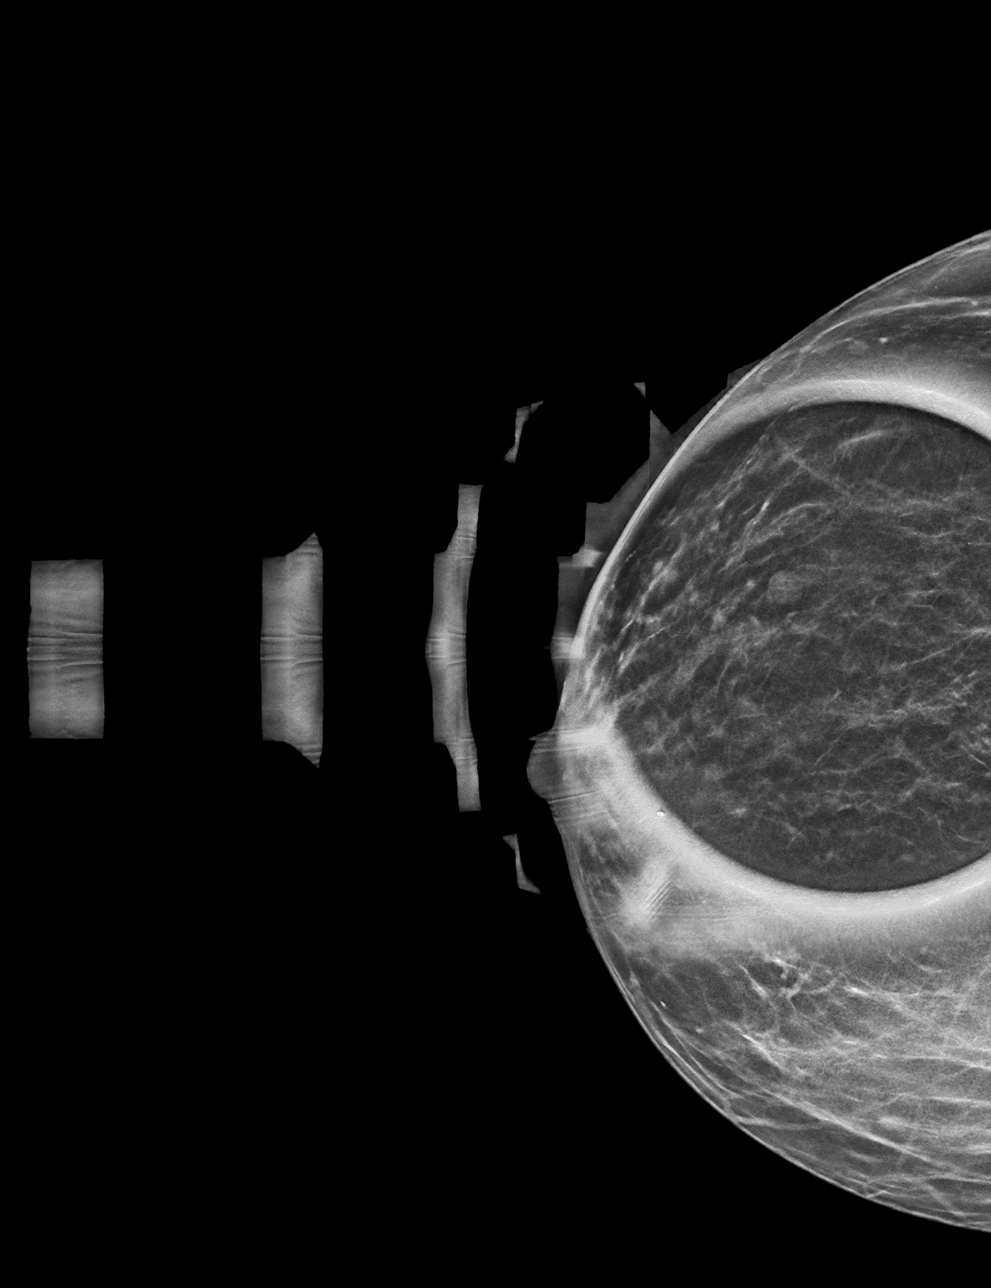

[R MLO synth-2D]
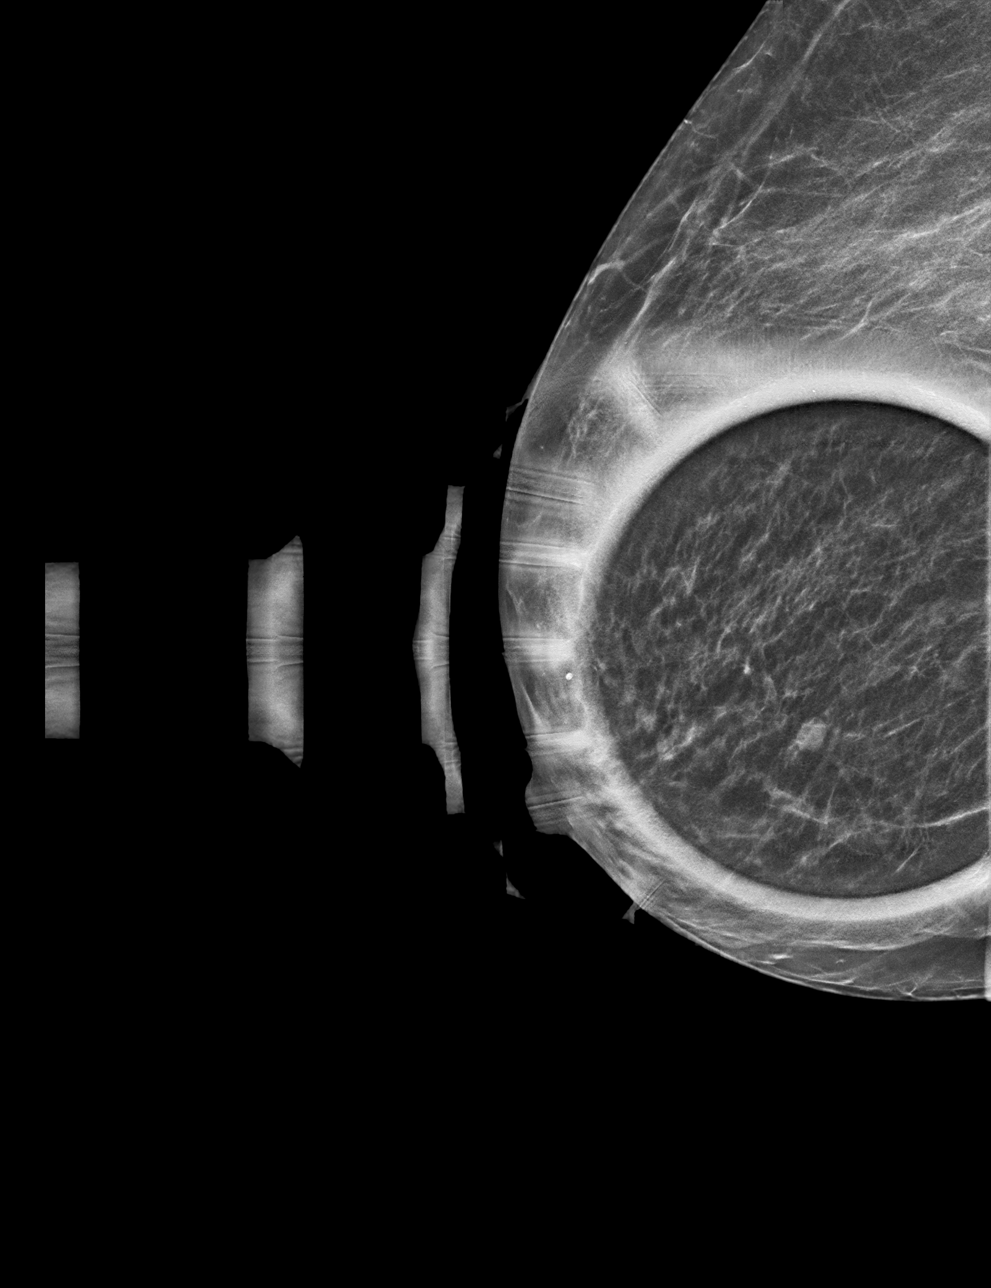

[R MLO tomo · tomo slice 25/50.0]
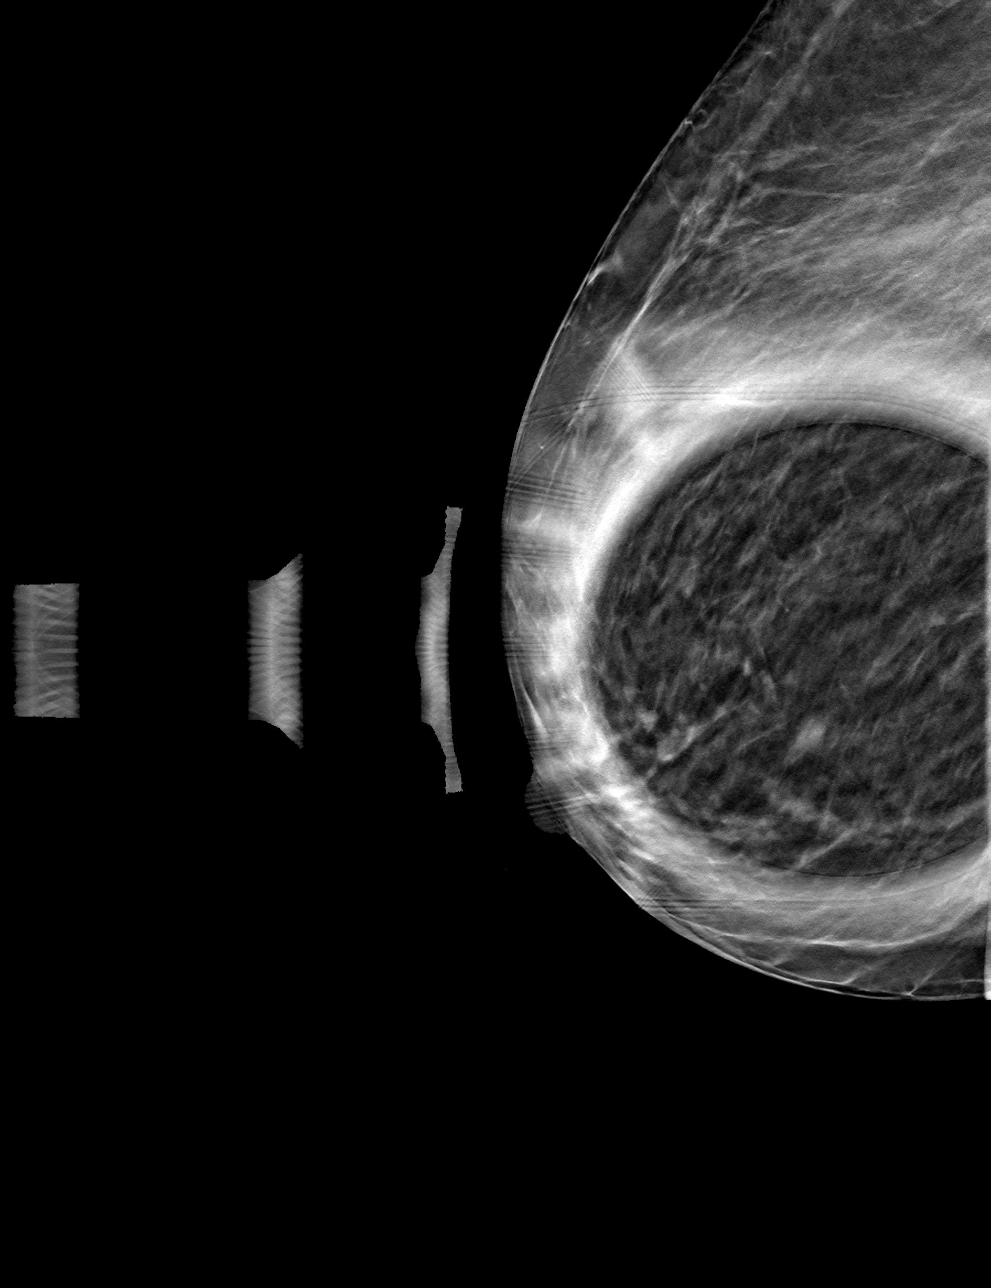

[R CC tomo · tomo slice 21/41.0]
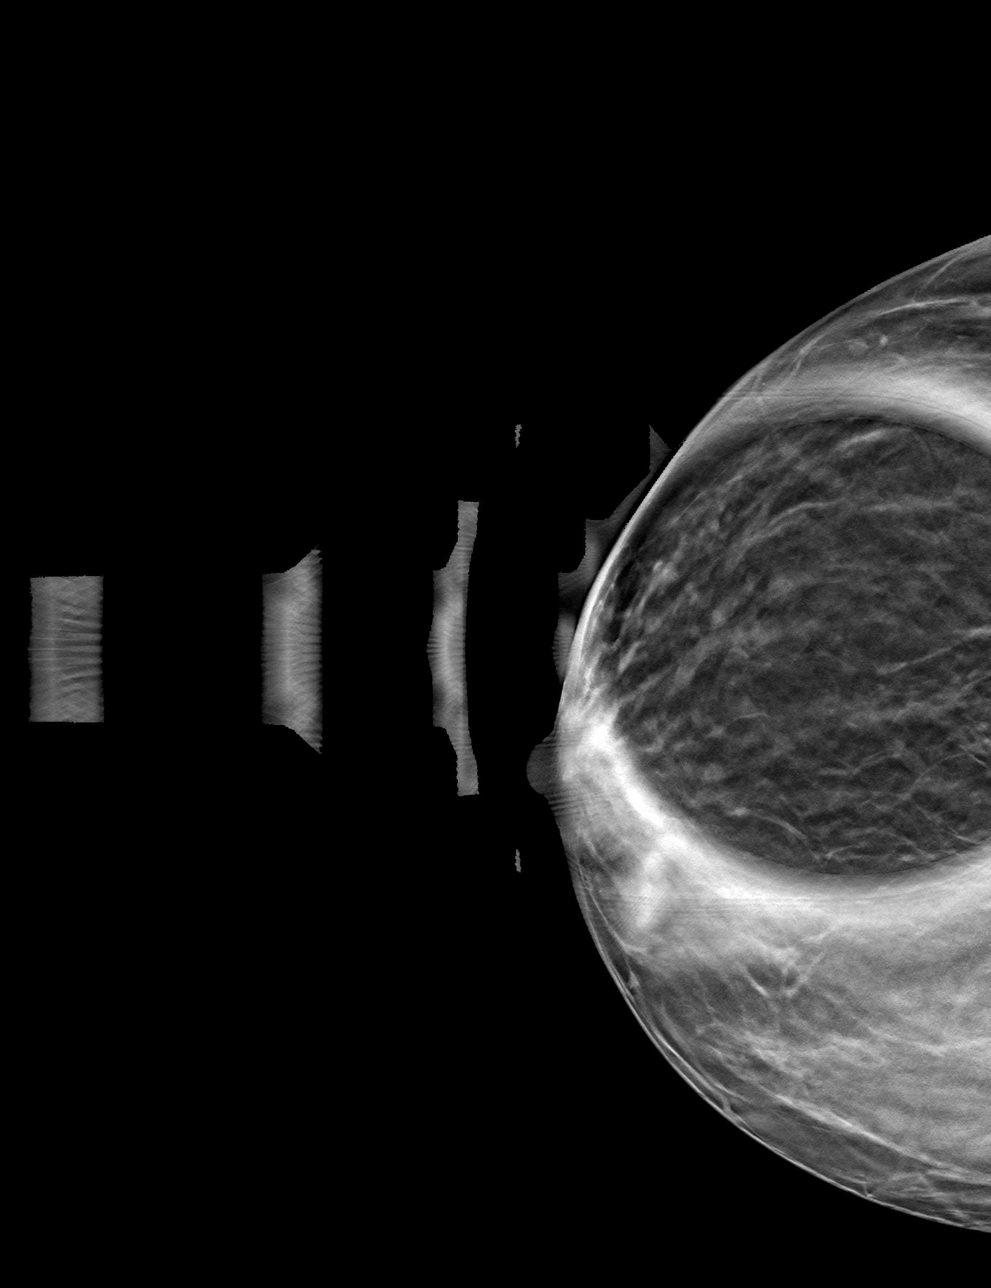

[4 of 12 positions shown; findings below may reference images not displayed]

ACR Breast Density Category b: There are scattered areas of
fibroglandular density.
FINDINGS: Circumscribed 0.6 cm mass persists in the lower outer quadrant of
the right breast, middle third.

Mammographic images were processed with CAD.

Targeted ultrasound is performed, showing an oval circumscribed cyst
with minimal internal debris and a thin internal septation in the
right breast 8 o'clock position 1 cm from the nipple. Mass measures
0.6 x 0.3 x 0.6 cm.
IMPRESSION: 0.6 cm benign cyst in the 8 o'clock position the right breast. No
evidence of malignancy.

RECOMMENDATION:
Screening mammogram in one year.(Code:TR-D-EX9)

I have discussed the findings and recommendations with the patient.
If applicable, a reminder letter will be sent to the patient
regarding the next appointment.

BI-RADS CATEGORY  2: Benign.

## 2020-10-28 ENCOUNTER — Emergency Department (HOSPITAL_COMMUNITY): Payer: Medicare Other

## 2020-10-28 ENCOUNTER — Inpatient Hospital Stay (HOSPITAL_COMMUNITY)
Admission: EM | Admit: 2020-10-28 | Discharge: 2020-11-23 | DRG: 177 | Disposition: E | Payer: Medicare Other | Attending: Internal Medicine | Admitting: Internal Medicine

## 2020-10-28 ENCOUNTER — Other Ambulatory Visit: Payer: Self-pay

## 2020-10-28 ENCOUNTER — Encounter (HOSPITAL_COMMUNITY): Payer: Self-pay

## 2020-10-28 DIAGNOSIS — E875 Hyperkalemia: Secondary | ICD-10-CM | POA: Diagnosis present

## 2020-10-28 DIAGNOSIS — E785 Hyperlipidemia, unspecified: Secondary | ICD-10-CM | POA: Diagnosis present

## 2020-10-28 DIAGNOSIS — D631 Anemia in chronic kidney disease: Secondary | ICD-10-CM | POA: Diagnosis present

## 2020-10-28 DIAGNOSIS — D6959 Other secondary thrombocytopenia: Secondary | ICD-10-CM | POA: Diagnosis present

## 2020-10-28 DIAGNOSIS — I2729 Other secondary pulmonary hypertension: Secondary | ICD-10-CM | POA: Diagnosis present

## 2020-10-28 DIAGNOSIS — J441 Chronic obstructive pulmonary disease with (acute) exacerbation: Secondary | ICD-10-CM | POA: Diagnosis present

## 2020-10-28 DIAGNOSIS — N170 Acute kidney failure with tubular necrosis: Secondary | ICD-10-CM | POA: Diagnosis not present

## 2020-10-28 DIAGNOSIS — F17210 Nicotine dependence, cigarettes, uncomplicated: Secondary | ICD-10-CM | POA: Diagnosis present

## 2020-10-28 DIAGNOSIS — J159 Unspecified bacterial pneumonia: Secondary | ICD-10-CM | POA: Diagnosis not present

## 2020-10-28 DIAGNOSIS — R7989 Other specified abnormal findings of blood chemistry: Secondary | ICD-10-CM | POA: Diagnosis not present

## 2020-10-28 DIAGNOSIS — I130 Hypertensive heart and chronic kidney disease with heart failure and stage 1 through stage 4 chronic kidney disease, or unspecified chronic kidney disease: Secondary | ICD-10-CM | POA: Diagnosis present

## 2020-10-28 DIAGNOSIS — Z8673 Personal history of transient ischemic attack (TIA), and cerebral infarction without residual deficits: Secondary | ICD-10-CM

## 2020-10-28 DIAGNOSIS — I361 Nonrheumatic tricuspid (valve) insufficiency: Secondary | ICD-10-CM | POA: Diagnosis not present

## 2020-10-28 DIAGNOSIS — J9621 Acute and chronic respiratory failure with hypoxia: Secondary | ICD-10-CM | POA: Diagnosis present

## 2020-10-28 DIAGNOSIS — I5082 Biventricular heart failure: Secondary | ICD-10-CM | POA: Diagnosis present

## 2020-10-28 DIAGNOSIS — R17 Unspecified jaundice: Secondary | ICD-10-CM

## 2020-10-28 DIAGNOSIS — E8770 Fluid overload, unspecified: Secondary | ICD-10-CM | POA: Diagnosis present

## 2020-10-28 DIAGNOSIS — Z515 Encounter for palliative care: Secondary | ICD-10-CM | POA: Diagnosis not present

## 2020-10-28 DIAGNOSIS — J1282 Pneumonia due to coronavirus disease 2019: Secondary | ICD-10-CM | POA: Diagnosis present

## 2020-10-28 DIAGNOSIS — R54 Age-related physical debility: Secondary | ICD-10-CM | POA: Diagnosis present

## 2020-10-28 DIAGNOSIS — I5033 Acute on chronic diastolic (congestive) heart failure: Secondary | ICD-10-CM | POA: Diagnosis present

## 2020-10-28 DIAGNOSIS — I5031 Acute diastolic (congestive) heart failure: Secondary | ICD-10-CM | POA: Diagnosis present

## 2020-10-28 DIAGNOSIS — R0602 Shortness of breath: Secondary | ICD-10-CM | POA: Diagnosis not present

## 2020-10-28 DIAGNOSIS — Z823 Family history of stroke: Secondary | ICD-10-CM

## 2020-10-28 DIAGNOSIS — D696 Thrombocytopenia, unspecified: Secondary | ICD-10-CM | POA: Diagnosis not present

## 2020-10-28 DIAGNOSIS — K219 Gastro-esophageal reflux disease without esophagitis: Secondary | ICD-10-CM | POA: Diagnosis present

## 2020-10-28 DIAGNOSIS — J9620 Acute and chronic respiratory failure, unspecified whether with hypoxia or hypercapnia: Secondary | ICD-10-CM | POA: Diagnosis present

## 2020-10-28 DIAGNOSIS — N179 Acute kidney failure, unspecified: Secondary | ICD-10-CM | POA: Diagnosis present

## 2020-10-28 DIAGNOSIS — E872 Acidosis: Secondary | ICD-10-CM | POA: Diagnosis present

## 2020-10-28 DIAGNOSIS — J440 Chronic obstructive pulmonary disease with acute lower respiratory infection: Secondary | ICD-10-CM | POA: Diagnosis present

## 2020-10-28 DIAGNOSIS — I340 Nonrheumatic mitral (valve) insufficiency: Secondary | ICD-10-CM | POA: Diagnosis not present

## 2020-10-28 DIAGNOSIS — U071 COVID-19: Principal | ICD-10-CM | POA: Diagnosis present

## 2020-10-28 DIAGNOSIS — I10 Essential (primary) hypertension: Secondary | ICD-10-CM | POA: Diagnosis not present

## 2020-10-28 DIAGNOSIS — I5030 Unspecified diastolic (congestive) heart failure: Secondary | ICD-10-CM | POA: Diagnosis present

## 2020-10-28 DIAGNOSIS — N184 Chronic kidney disease, stage 4 (severe): Secondary | ICD-10-CM | POA: Diagnosis present

## 2020-10-28 DIAGNOSIS — E1122 Type 2 diabetes mellitus with diabetic chronic kidney disease: Secondary | ICD-10-CM | POA: Diagnosis present

## 2020-10-28 DIAGNOSIS — Z7189 Other specified counseling: Secondary | ICD-10-CM | POA: Diagnosis not present

## 2020-10-28 DIAGNOSIS — E119 Type 2 diabetes mellitus without complications: Secondary | ICD-10-CM

## 2020-10-28 DIAGNOSIS — J849 Interstitial pulmonary disease, unspecified: Secondary | ICD-10-CM | POA: Diagnosis present

## 2020-10-28 DIAGNOSIS — Z79899 Other long term (current) drug therapy: Secondary | ICD-10-CM

## 2020-10-28 DIAGNOSIS — K7460 Unspecified cirrhosis of liver: Secondary | ICD-10-CM | POA: Diagnosis present

## 2020-10-28 DIAGNOSIS — I959 Hypotension, unspecified: Secondary | ICD-10-CM | POA: Diagnosis present

## 2020-10-28 DIAGNOSIS — Z66 Do not resuscitate: Secondary | ICD-10-CM | POA: Diagnosis present

## 2020-10-28 DIAGNOSIS — D509 Iron deficiency anemia, unspecified: Secondary | ICD-10-CM | POA: Diagnosis present

## 2020-10-28 DIAGNOSIS — J449 Chronic obstructive pulmonary disease, unspecified: Secondary | ICD-10-CM | POA: Diagnosis not present

## 2020-10-28 DIAGNOSIS — I2781 Cor pulmonale (chronic): Secondary | ICD-10-CM | POA: Diagnosis present

## 2020-10-28 DIAGNOSIS — Z9981 Dependence on supplemental oxygen: Secondary | ICD-10-CM

## 2020-10-28 DIAGNOSIS — E1165 Type 2 diabetes mellitus with hyperglycemia: Secondary | ICD-10-CM | POA: Diagnosis present

## 2020-10-28 DIAGNOSIS — N261 Atrophy of kidney (terminal): Secondary | ICD-10-CM | POA: Diagnosis present

## 2020-10-28 LAB — CBC WITH DIFFERENTIAL/PLATELET
Abs Immature Granulocytes: 0.01 10*3/uL (ref 0.00–0.07)
Basophils Absolute: 0 10*3/uL (ref 0.0–0.1)
Basophils Relative: 0 %
Eosinophils Absolute: 0 10*3/uL (ref 0.0–0.5)
Eosinophils Relative: 0 %
HCT: 37.3 % (ref 36.0–46.0)
Hemoglobin: 11.8 g/dL — ABNORMAL LOW (ref 12.0–15.0)
Immature Granulocytes: 0 %
Lymphocytes Relative: 13 %
Lymphs Abs: 0.3 10*3/uL — ABNORMAL LOW (ref 0.7–4.0)
MCH: 28.4 pg (ref 26.0–34.0)
MCHC: 31.6 g/dL (ref 30.0–36.0)
MCV: 89.9 fL (ref 80.0–100.0)
Monocytes Absolute: 0.1 10*3/uL (ref 0.1–1.0)
Monocytes Relative: 4 %
Neutro Abs: 2 10*3/uL (ref 1.7–7.7)
Neutrophils Relative %: 83 %
Platelets: 117 10*3/uL — ABNORMAL LOW (ref 150–400)
RBC: 4.15 MIL/uL (ref 3.87–5.11)
RDW: 14.6 % (ref 11.5–15.5)
WBC: 2.5 10*3/uL — ABNORMAL LOW (ref 4.0–10.5)
nRBC: 0 % (ref 0.0–0.2)

## 2020-10-28 LAB — I-STAT CHEM 8, ED
BUN: 82 mg/dL — ABNORMAL HIGH (ref 8–23)
Calcium, Ion: 1.06 mmol/L — ABNORMAL LOW (ref 1.15–1.40)
Chloride: 93 mmol/L — ABNORMAL LOW (ref 98–111)
Creatinine, Ser: 3.3 mg/dL — ABNORMAL HIGH (ref 0.44–1.00)
Glucose, Bld: 129 mg/dL — ABNORMAL HIGH (ref 70–99)
HCT: 37 % (ref 36.0–46.0)
Hemoglobin: 12.6 g/dL (ref 12.0–15.0)
Potassium: 3.8 mmol/L (ref 3.5–5.1)
Sodium: 137 mmol/L (ref 135–145)
TCO2: 34 mmol/L — ABNORMAL HIGH (ref 22–32)

## 2020-10-28 LAB — FERRITIN: Ferritin: 110 ng/mL (ref 11–307)

## 2020-10-28 LAB — BLOOD GAS, ARTERIAL
Acid-Base Excess: 7.9 mmol/L — ABNORMAL HIGH (ref 0.0–2.0)
Bicarbonate: 31.1 mmol/L — ABNORMAL HIGH (ref 20.0–28.0)
FIO2: 24
O2 Saturation: 88 %
Patient temperature: 36.9
pCO2 arterial: 46.6 mmHg (ref 32.0–48.0)
pH, Arterial: 7.453 — ABNORMAL HIGH (ref 7.350–7.450)
pO2, Arterial: 57.2 mmHg — ABNORMAL LOW (ref 83.0–108.0)

## 2020-10-28 LAB — COMPREHENSIVE METABOLIC PANEL
ALT: 20 U/L (ref 0–44)
AST: 33 U/L (ref 15–41)
Albumin: 3.7 g/dL (ref 3.5–5.0)
Alkaline Phosphatase: 94 U/L (ref 38–126)
Anion gap: 15 (ref 5–15)
BUN: 84 mg/dL — ABNORMAL HIGH (ref 8–23)
CO2: 31 mmol/L (ref 22–32)
Calcium: 8.6 mg/dL — ABNORMAL LOW (ref 8.9–10.3)
Chloride: 93 mmol/L — ABNORMAL LOW (ref 98–111)
Creatinine, Ser: 3.28 mg/dL — ABNORMAL HIGH (ref 0.44–1.00)
GFR, Estimated: 14 mL/min — ABNORMAL LOW (ref >60)
Glucose, Bld: 151 mg/dL — ABNORMAL HIGH (ref 70–99)
Potassium: 3.7 mmol/L (ref 3.5–5.1)
Sodium: 139 mmol/L (ref 135–145)
Total Bilirubin: 1.2 mg/dL (ref 0.3–1.2)
Total Protein: 6.9 g/dL (ref 6.5–8.1)

## 2020-10-28 LAB — BRAIN NATRIURETIC PEPTIDE: B Natriuretic Peptide: 2370 pg/mL — ABNORMAL HIGH (ref 0.0–100.0)

## 2020-10-28 LAB — TRIGLYCERIDES: Triglycerides: 102 mg/dL (ref <150)

## 2020-10-28 LAB — SARS CORONAVIRUS 2 BY RT PCR (HOSPITAL ORDER, PERFORMED IN ~~LOC~~ HOSPITAL LAB): SARS Coronavirus 2: POSITIVE — AB

## 2020-10-28 LAB — D-DIMER, QUANTITATIVE: D-Dimer, Quant: 1.04 ug/mL-FEU — ABNORMAL HIGH (ref 0.00–0.50)

## 2020-10-28 LAB — PROTIME-INR
INR: 1.1 (ref 0.8–1.2)
Prothrombin Time: 13.4 seconds (ref 11.4–15.2)

## 2020-10-28 LAB — LACTIC ACID, PLASMA: Lactic Acid, Venous: 3.3 mmol/L (ref 0.5–1.9)

## 2020-10-28 LAB — APTT: aPTT: 30 seconds (ref 24–36)

## 2020-10-28 LAB — FIBRINOGEN: Fibrinogen: 304 mg/dL (ref 210–475)

## 2020-10-28 LAB — LIPASE, BLOOD: Lipase: 26 U/L (ref 11–51)

## 2020-10-28 LAB — LACTATE DEHYDROGENASE: LDH: 173 U/L (ref 98–192)

## 2020-10-28 LAB — C-REACTIVE PROTEIN: CRP: 3.4 mg/dL — ABNORMAL HIGH (ref <1.0)

## 2020-10-28 LAB — PROCALCITONIN: Procalcitonin: 0.17 ng/mL

## 2020-10-28 MED ORDER — SODIUM CHLORIDE 0.9 % IV SOLN
1.0000 g | INTRAVENOUS | Status: DC
  Administered 2020-10-29 – 2020-11-03 (×6): 1 g via INTRAVENOUS
  Filled 2020-10-28: qty 10
  Filled 2020-10-28: qty 0.06
  Filled 2020-10-28: qty 10
  Filled 2020-10-28: qty 1
  Filled 2020-10-28: qty 0.06
  Filled 2020-10-28: qty 10

## 2020-10-28 MED ORDER — ONDANSETRON HCL 4 MG/2ML IJ SOLN: 4.0000 mg | Freq: Once | INTRAMUSCULAR | Status: DC

## 2020-10-28 MED ORDER — VANCOMYCIN HCL 1250 MG/250ML IV SOLN: 1250.0000 mg | Freq: Once | INTRAVENOUS | Status: DC

## 2020-10-28 MED ORDER — HEPARIN SODIUM (PORCINE) 5000 UNIT/ML IJ SOLN
5000.0000 [IU] | Freq: Three times a day (TID) | INTRAMUSCULAR | Status: DC
  Administered 2020-10-28 – 2020-11-08 (×33): 5000 [IU] via SUBCUTANEOUS
  Filled 2020-10-28 (×30): qty 1

## 2020-10-28 MED ORDER — TECHNETIUM TO 99M ALBUMIN AGGREGATED
4.0000 | Freq: Once | INTRAVENOUS | Status: AC | PRN
  Administered 2020-10-28: 4.2 via INTRAVENOUS

## 2020-10-28 MED ORDER — SODIUM CHLORIDE 0.9 % IV SOLN
2.0000 g | Freq: Once | INTRAVENOUS | Status: AC
  Administered 2020-10-28: 2 g via INTRAVENOUS
  Filled 2020-10-28: qty 2

## 2020-10-28 MED ORDER — VANCOMYCIN HCL IN DEXTROSE 1-5 GM/200ML-% IV SOLN: 1000.0000 mg | Freq: Once | INTRAVENOUS | Status: DC

## 2020-10-28 MED ORDER — SODIUM CHLORIDE 0.9 % IV SOLN
100.0000 mg | Freq: Once | INTRAVENOUS | Status: AC
  Administered 2020-10-28: 100 mg via INTRAVENOUS
  Filled 2020-10-28: qty 20

## 2020-10-28 MED ORDER — VANCOMYCIN HCL 500 MG/100ML IV SOLN: 500.0000 mg | INTRAVENOUS | Status: DC

## 2020-10-28 MED ORDER — FUROSEMIDE 10 MG/ML IJ SOLN
20.0000 mg | Freq: Once | INTRAMUSCULAR | Status: AC
  Administered 2020-10-28: 20 mg via INTRAVENOUS
  Filled 2020-10-28: qty 2

## 2020-10-28 MED ORDER — SODIUM CHLORIDE 0.9 % IV SOLN: 200.0000 mg | Freq: Once | INTRAVENOUS | Status: DC

## 2020-10-28 MED ORDER — METHYLPREDNISOLONE SODIUM SUCC 125 MG IJ SOLR
80.0000 mg | Freq: Three times a day (TID) | INTRAMUSCULAR | Status: DC
  Administered 2020-10-28 – 2020-10-31 (×8): 80 mg via INTRAVENOUS
  Filled 2020-10-28 (×8): qty 2

## 2020-10-28 MED ORDER — FENTANYL CITRATE (PF) 100 MCG/2ML IJ SOLN: 50.0000 ug | Freq: Once | INTRAMUSCULAR | Status: DC

## 2020-10-28 MED ORDER — LACTATED RINGERS IV SOLN: INTRAVENOUS | Status: DC

## 2020-10-28 MED ORDER — LINACLOTIDE 145 MCG PO CAPS
145.0000 ug | ORAL_CAPSULE | Freq: Every day | ORAL | Status: DC | PRN
  Administered 2020-10-30: 145 ug via ORAL
  Filled 2020-10-28 (×3): qty 1

## 2020-10-28 MED ORDER — SODIUM CHLORIDE 0.9 % IV SOLN: 100.0000 mg | Freq: Every day | INTRAVENOUS | Status: DC

## 2020-10-28 MED ORDER — SODIUM CHLORIDE 0.9 % IV SOLN
100.0000 mg | Freq: Every day | INTRAVENOUS | Status: AC
  Administered 2020-10-29 – 2020-11-01 (×4): 100 mg via INTRAVENOUS
  Filled 2020-10-28 (×5): qty 20

## 2020-10-28 MED ORDER — SODIUM CHLORIDE 0.9 % IV SOLN: 2.0000 g | INTRAVENOUS | Status: DC

## 2020-10-28 MED ORDER — SODIUM CHLORIDE 0.9 % IV SOLN
500.0000 mg | INTRAVENOUS | Status: DC
  Administered 2020-10-28 – 2020-10-29 (×2): 500 mg via INTRAVENOUS
  Filled 2020-10-28 (×2): qty 500

## 2020-10-28 MED ORDER — PANTOPRAZOLE SODIUM 40 MG PO TBEC
40.0000 mg | DELAYED_RELEASE_TABLET | Freq: Every day | ORAL | Status: DC
  Administered 2020-10-28 – 2020-11-09 (×13): 40 mg via ORAL
  Filled 2020-10-28 (×13): qty 1

## 2020-10-28 MED ORDER — METRONIDAZOLE IN NACL 5-0.79 MG/ML-% IV SOLN
500.0000 mg | Freq: Once | INTRAVENOUS | Status: AC
  Administered 2020-10-28: 500 mg via INTRAVENOUS
  Filled 2020-10-28: qty 100

## 2020-10-28 MED ORDER — IPRATROPIUM-ALBUTEROL 20-100 MCG/ACT IN AERS
1.0000 | INHALATION_SPRAY | Freq: Four times a day (QID) | RESPIRATORY_TRACT | Status: DC
  Administered 2020-10-28 – 2020-11-10 (×47): 1 via RESPIRATORY_TRACT
  Filled 2020-10-28 (×2): qty 4

## 2020-10-28 NOTE — Progress Notes (Addendum)
Pharmacy Antibiotic Note  Erica [name] is a 70 y.o. female admitted on 10/29/2020 with SOB.  Pt rec'd cefepime, metronidazole and vancomycin X 1 in ED for infection of unknown source and is continuing on cefepime and vancomycin. Pt is COVID positive. Pharmacy has been consulted for remdesivir dosing.  AST/ALT 33/20  Plan: Remdesivir 200 mg IV X 1, followed by 100 mg IV daily X 4 days Monitor WBC, temp, clinical improvement, cultures, AST/ALT   Height: '5\' 4"'$  (162.6 cm) Weight: 54.9 kg (121 lb) IBW/kg (Calculated) : 54.7  Temp (24hrs), Avg:98.4 F (36.9 C), Min:98.4 F (36.9 C), Max:98.4 F (36.9 C)  Recent Labs  Lab 10/31/2020 1500 11/05/2020 1504 11/12/2020 1644  WBC 2.5*  --   --   CREATININE  --  3.30* 3.28*  LATICACIDVEN 3.3*  --   --     Estimated Creatinine Clearance: 13.6 mL/min (A) (by C-G formula based on SCr of 3.28 mg/dL (H)).    No Known Allergies  Microbiology results: 2/3 BCx X 2: pending 2/3 COVID: positive  Thank you for allowing pharmacy to be a part of this patient's care.  Gillermina Hu, PharmD, BCPS, [facility] Clinical Pharmacist 11/15/2020 6:14 PM

## 2020-10-28 NOTE — Progress Notes (Signed)
Pharmacy Antibiotic Note  Erica [name] is a 70 y.o. female admitted on 11/05/2020 with unknown source of infection.  Pharmacy has been consulted for Vancomycin and Cefepime dosing.  Plan: Vancomycin 1250 mg IV x 1 dose. Vancomycin 500 mg IV every 48 hours. Expected AUC 448. Cefepime 2000 mg IV every 24 hours. Monitor labs, c/s, and vanco level as indicated.  Height: '5\' 4"'$  (162.6 cm) Weight: 54.9 kg (121 lb) IBW/kg (Calculated) : 54.7  Temp (24hrs), Avg:98.4 F (36.9 C), Min:98.4 F (36.9 C), Max:98.4 F (36.9 C)  Recent Labs  Lab 10/31/2020 1500 11/20/2020 1504  WBC 2.5*  --   CREATININE  --  3.30*  LATICACIDVEN 3.3*  --     Estimated Creatinine Clearance: 13.5 mL/min (A) (by C-G formula based on SCr of 3.3 mg/dL (H)).    No Known Allergies  Antimicrobials this admission: Vanco 2/3 >>  Cefepime 2/3 >>  Flagyl 2/3   Microbiology results: 2/3 BCx: pending 2/3 UCx: pending    Thank you for allowing pharmacy to be a part of this patient's care.  Margot Ables, PharmD Clinical Pharmacist 11/02/2020 4:10 PM

## 2020-10-28 NOTE — H&P (Signed)
TRH H&P   Patient Demographics:    Erica [name], is a 70 y.o. female  MRN: [id number]   DOB - [birth year]  Admit Date - 11/08/2020  Outpatient Primary MD for the patient is [facility]  Referring MD/NP/PA: PA Harris  Patient coming from: Home  Chief Complaint  Patient presents with  . Shortness of Breath      HPI:    Erica [name]  is a 70 y.o. female, with a past medical history of COPD and chronic hypoxic respiratory failure on 4 L via nasal cannula at baseline, history of interstitial lung disease, type 2 diabetes, chronic kidney disease stage IV, chronic diastolic heart failure . -Patient presents to ED due to complaints of shortness of breath, patient reported this morning she stood up from chair, where she became dizzy, lightheaded, and short of breath, but patient report worsening hypoxia for last 2 to 3 days, as well she does report some cough, and increased work of breathing, she denies any productive phlegm, fever or chills, she denies any weight gain, lower extremity edema, hemoptysis, as well she had some complaints of abdominal pain, currently resolved, patient reports she is vaccinated against Covid but not boosted. -In ED patient was hypoxic, she is requiring 15 L high flow nasal cannula, chest x-ray significant for vascular congestion, her BNP significantly elevated at 2370, creatinine elevated at 3.3, she tested positive for COVID-19, she was started on Solu-Medrol and Remdesivir, broad-spectrum antibiotic given elevated lactic acid at 3.3, and [facility] hospitalist consulted to admit.   Review of systems:    In addition to the HPI above,  No Fever-chills,she reports generalized weakness, and fatigue . No Headache, No changes with Vision or hearing, No problems swallowing food or Liquids, No Chest pain, she reports cough and SOB No Abdominal  pain, No Nausea or Vommitting, Bowel movements are regular, No Blood in stool or Urine, No dysuria, No new skin rashes or bruises, No new joints pains-aches,  No new weakness, tingling, numbness in any extremity, No recent weight gain or loss, No polyuria, polydypsia or polyphagia, No significant Mental Stressors.  A full 10 point Review of Systems was done, except as stated above, all other Review of Systems were negative.   With Past History of the following :    Past Medical History:  Diagnosis Date  . Arthritis   . Chronic back pain   . CKD (chronic kidney disease), stage IV ([place])   . COPD (chronic obstructive pulmonary disease) (HCC)    2L home O2  . Diastolic heart failure ([place])   . Essential hypertension   . History of GI bleed    Suspected AVMs  . History of stroke   . Hyperlipidemia   . Interstitial lung disease ([place])   . PSVT (paroxysmal supraventricular tachycardia) ([place])   . Type 2 diabetes mellitus ([place])  Past Surgical History:  Procedure Laterality Date  . ABDOMINAL HYSTERECTOMY    . COLONOSCOPY  2009   Dr. Gala Romney: cluster of diminutive rectal polyps and 2 diminutive polyps in rectosigmoid junction, hyperplastic  . COLONOSCOPY WITH PROPOFOL N/A 10/18/2017   Procedure: COLONOSCOPY WITH PROPOFOL;  Surgeon: Daneil Dolin, MD;  [facility];  Service: Endoscopy;  Laterality: N/A;  9:30am  . ESOPHAGOGASTRODUODENOSCOPY N/A 01/25/2018   Procedure: ESOPHAGOGASTRODUODENOSCOPY (EGD);  Surgeon: Danie Binder, MD;  Location: [facility];  Service: Endoscopy;  Laterality: N/A;  . ESOPHAGOGASTRODUODENOSCOPY (EGD) WITH PROPOFOL N/A 08/29/2017   Dr. Gala Romney with propofol: small hiatal hernia. 4cm fundal diverticulum.   Marland Kitchen GIVENS CAPSULE STUDY N/A 01/25/2018   Procedure: GIVENS CAPSULE STUDY;  Surgeon: Danie Binder, MD;  Location: [facility];  Service: Endoscopy;  Laterality: N/A;  . HAND SURGERY     In an accident at work, finger amputated       Social History:     Social History   Tobacco Use  . Smoking status: Current Some Day Smoker    Packs/day: 0.25    Years: 52.00    Pack years: 13.00    Types: Cigarettes  . Smokeless tobacco: Never Used  Substance Use Topics  . Alcohol use: No     Likely will request to have readmissions over   Family History :     Family History  Problem Relation Age of Onset  . CVA Mother 5  . Cancer Neg Hx   . Colon cancer Neg Hx   . Breast cancer Neg Hx   Management   Home Medications:   Prior to Admission medications   Medication Sig Start Date End Date Taking? Authorizing Provider  albuterol (VENTOLIN HFA) 108 (90 Base) MCG/ACT inhaler Inhale 1 puff into the lungs every 6 (six) hours as needed for shortness of breath. 03/25/20   [provider]  Amino Acids-Protein Hydrolys (FEEDING SUPPLEMENT, PRO-STAT SUGAR FREE 64,) LIQD Take 30 mLs by mouth 2 (two) times daily. 03/30/20   Manuella Ghazi, Pratik D, DO  amLODipine (NORVASC) 10 MG tablet Take 1 tablet by mouth.    [provider]  famotidine (PEPCID) 20 MG tablet Take 20 mg by mouth daily.  03/29/18   [provider]  ferrous sulfate 325 (65 FE) MG EC tablet Take 1 tablet (325 mg total) by mouth 2 (two) times daily. 01/25/18 08/26/20  Isaac Bliss, Rayford Halsted, MD  guaiFENesin (MUCINEX) 600 MG 12 hr tablet Take 600 mg by mouth 2 (two) times daily as needed.    [provider]  ipratropium-albuterol (DUONEB) 0.5-2.5 (3) MG/3ML SOLN Take 3 mLs by nebulization every 6 (six) hours as needed (for shortness of breath).    [provider]  Linaclotide Rolan Lipa) 145 MCG CAPS capsule Take 145 mcg by mouth daily as needed (for constipation).     [provider]  omega-3 acid ethyl esters (LOVAZA) 1 g capsule Take 1 g by mouth daily.  12/07/16   [provider]  pantoprazole (PROTONIX) 40 MG tablet Take 1 tablet (40 mg total) by mouth daily. 01/25/18   Isaac Bliss, Rayford Halsted, MD  Tiotropium  Bromide-Olodaterol (STIOLTO RESPIMAT) 2.5-2.5 MCG/ACT AERS Inhale 2 puffs into the lungs daily. 08/26/20   Tanda Rockers, MD  torsemide (DEMADEX) 20 MG tablet Take 40 mg by mouth every Monday, Wednesday, and Friday.    [provider]     Allergies:    No Known Allergies  Physical Exam:   Vitals  Blood pressure 107/65, pulse 92, temperature 98.4 F (36.9 C), temperature source Oral, resp. rate 19, height '5\' 4"'$  (1.626 m), weight 54.9 kg, SpO2 100 %.   1. General frail elderly female, laying in bed, in no apparent distress, mildly tachypneic but no use of accessory muscles.  2. Normal affect and insight, Not Suicidal or Homicidal, Awake Alert, Oriented X 3.  3. No F.N deficits, ALL C.Nerves Intact, Strength 5/5 all 4 extremities, Sensation intact all 4 extremities, Plantars down going.  4. Ears and Eyes appear Normal, Conjunctivae clear, PERRLA. Moist Oral Mucosa.  5. Supple Neck, No JVD, No cervical lymphadenopathy appriciated, No Carotid Bruits.  6. Symmetrical Chest wall movement, she had crackles at the bases, diffuse wheezing.  7. RRR, No Gallops, Rubs or Murmurs, No Parasternal Heave.  8. Positive Bowel Sounds, Abdomen Soft, No tenderness, No organomegaly appriciated,No rebound -guarding or rigidity.  9.  No Cyanosis, Normal Skin Turgor, No Skin Rash or Bruise.  10. Good muscle tone,  joints appear normal , no effusions, Normal ROM.  11. No Palpable Lymph Nodes in Neck or Axillae     Data Review:    CBC Recent Labs  Lab 11/13/2020 1500 11/16/2020 1504  WBC 2.5*  --   HGB 11.8* 12.6  HCT 37.3 37.0  PLT 117*  --   MCV 89.9  --   MCH 28.4  --   MCHC 31.6  --   RDW 14.6  --   LYMPHSABS 0.3*  --   MONOABS 0.1  --   EOSABS 0.0  --   BASOSABS 0.0  --    ------------------------------------------------------------------------------------------------------------------  Chemistries  Recent Labs  Lab 11/08/2020 1504 11/21/2020 1644  NA 137 139  K 3.8  3.7  CL 93* 93*  CO2  --  31  GLUCOSE 129* 151*  BUN 82* 84*  CREATININE 3.30* 3.28*  CALCIUM  --  8.6*  AST  --  33  ALT  --  20  ALKPHOS  --  94  BILITOT  --  1.2   ------------------------------------------------------------------------------------------------------------------ estimated creatinine clearance is 13.6 mL/min (A) (by C-G formula based on SCr of 3.28 mg/dL (H)). ------------------------------------------------------------------------------------------------------------------ No results for input(s): TSH, T4TOTAL, T3FREE, THYROIDAB in the last 72 hours.  Invalid input(s): FREET3  Coagulation profile Recent Labs  Lab 11/21/2020 1644  INR 1.1   ------------------------------------------------------------------------------------------------------------------- Recent Labs    11/22/2020 1643  DDIMER 1.04*   -------------------------------------------------------------------------------------------------------------------  Cardiac Enzymes No results for input(s): CKMB, TROPONINI, MYOGLOBIN in the last 168 hours.  Invalid input(s): CK ------------------------------------------------------------------------------------------------------------------    Component Value Date/Time   BNP 2,370.0 (H) 10/26/2020 1500     ---------------------------------------------------------------------------------------------------------------  Urinalysis    Component Value Date/Time   COLORURINE YELLOW 03/28/2020 0500   APPEARANCEUR CLEAR 03/28/2020 0500   LABSPEC 1.012 03/28/2020 0500   PHURINE 5.0 03/28/2020 0500   GLUCOSEU NEGATIVE 03/28/2020 0500   HGBUR NEGATIVE 03/28/2020 0500   BILIRUBINUR NEGATIVE 03/28/2020 0500   KETONESUR NEGATIVE 03/28/2020 0500   PROTEINUR NEGATIVE 03/28/2020 0500   UROBILINOGEN 0.2 03/14/2013 1847   NITRITE NEGATIVE 03/28/2020 0500   LEUKOCYTESUR NEGATIVE 03/28/2020 0500     ----------------------------------------------------------------------------------------------------------------   Imaging Results:    CT ABDOMEN PELVIS WO CONTRAST  Result Date: 11/18/2020 CLINICAL DATA:  70 year old female with abdominal pain. EXAM: CT ABDOMEN AND PELVIS WITHOUT CONTRAST TECHNIQUE: Multidetector CT imaging of the abdomen and pelvis was performed following the standard protocol without IV contrast. COMPARISON:  CT abdomen pelvis dated  08/27/2017. FINDINGS: Evaluation of this exam is limited in the absence of intravenous contrast. Lower chest: Diffuse bilateral interstitial coarsening, progressed since the prior CT. There is a 1.7 x 2.5 cm right lung base subpleural nodule which may represent complex pleural effusion or an area of rounded atelectasis. A mass is not excluded. Dedicated chest CT is recommended for better evaluation on a nonemergent/outpatient basis. Additional 11 x 20 mm nodular density at the right lung base (6/2) likely an area of scarring. There is cardiomegaly. Coronary vascular calcification. No intra-abdominal free air.  Small ascites. Hepatobiliary: The liver is unremarkable. No intrahepatic biliary dilatation. The gallbladder is distended. No calcified gallstone. Pancreas: Unremarkable. No pancreatic ductal dilatation or surrounding inflammatory changes. Spleen: Normal in size without focal abnormality. Adrenals/Urinary Tract: The adrenal glands unremarkable. Atrophic right kidney. Vascular calcification versus punctate nonobstructing left renal upper pole calculus. No hydronephrosis. The left ureter and urinary bladder appear unremarkable. Stomach/Bowel: There is sigmoid diverticulosis without active inflammatory changes. There is no bowel obstruction or active inflammation. The appendix is not visualized with certainty. No inflammatory changes identified in the right lower quadrant. Vascular/Lymphatic: Advanced aortoiliac atherosclerotic disease. The IVC is  unremarkable. No portal venous gas. There is no adenopathy. Reproductive: Hysterectomy.  No adnexal masses. Other: Midline vertical anterior pelvic wall incisional scar. No fluid collection. Musculoskeletal: Osteopenia with degenerative changes of the spine. No acute osseous pathology. IMPRESSION: 1. No acute intra-abdominal or pelvic pathology. 2. Sigmoid diverticulosis. No bowel obstruction. 3. Atrophic right kidney. 4. Small ascites. 5. Right lung base subpleural nodule. Dedicated chest CT is recommended for better evaluation on a nonemergent/outpatient basis. 6. Aortic Atherosclerosis (ICD10-I70.0). Electronically Signed   By: Anner Crete M.D.   On: 11/12/2020 17:04   NM Pulmonary Perfusion  Result Date: 11/17/2020 CLINICAL DATA:  Respiratory failure. Acute on chronic respiratory failure. EXAM: NUCLEAR MEDICINE PERFUSION LUNG SCAN TECHNIQUE: Perfusion images were obtained in multiple projections after intravenous injection of radiopharmaceutical. Ventilation scans intentionally deferred if perfusion scan and chest x-ray adequate for interpretation during COVID 19 epidemic. RADIOPHARMACEUTICALS:  4.2 mCi Tc-6mMAA IV COMPARISON:  Chest radiograph earlier today. FINDINGS: No wedge-shaped perfusion defects to suggest pulmonary embolus. Minimal defect in the periphery of the right mid lung corresponds to small amount of fluid in the fissure on radiograph. Cardiomegaly. IMPRESSION: No scintigraphic evidence of pulmonary embolus. Electronically Signed   By: MKeith RakeM.D.   On: 11/04/2020 18:00   DG Chest Port 1 View  Result Date: 11/02/2020 CLINICAL DATA:  70year old female with shortness of breath. EXAM: PORTABLE CHEST 1 VIEW COMPARISON:  Chest radiograph dated 03/28/2020. FINDINGS: There is cardiomegaly with vascular congestion. There is diffuse chronic interstitial coarsening in keeping with interstitial lung disease. There are small bilateral pleural effusions. Calcified right pleural plaque.  Slight increased density at the right lung base may represent atelectasis. Developing infiltrate is not excluded. There is no pneumothorax. Atherosclerotic calcification of the aorta. No acute osseous pathology. IMPRESSION: 1. Cardiomegaly with vascular congestion and small bilateral pleural effusions. 2. Background of interstitial lung disease. 3. Right lung base atelectasis versus developing infiltrate. Electronically Signed   By: AAnner CreteM.D.   On: 11/07/2020 15:42    My personal review of EKG: Rhythm NSR, Rate  91 /min, QTc 491   Assessment & Plan:    Active Problems:   COPD exacerbation ([facility]   Hypertension   Diabetes mellitus type 2, controlled ([place]   Acute on chronic respiratory failure ([facility]CC)   (HFpEF) heart failure  with preserved ejection fraction (HCC)   COPD with acute exacerbation (HCC)   Hyperkalemia   Acute diastolic (congestive) heart failure (HCC)   Anemia due to stage 4 chronic kidney disease (HCC)   Cigarette smoker   Acute on chronic hypoxic respiratory failure -This appears to be multifactorial, in the setting of COVID-19 of pneumonia, volume overload and COPD exacerbation. -D-dimer is around 1, VQ scan with no evidence of PE -She is on 4 L nasal cannula at baseline, currently on 10 to 12 L high flow nasal cannula  COVID-19 of pneumonia -She is vaccinated, but not boosted. -Continue with IV Solu-Medrol. -Continue with IV remdesivir. -Patient presents with some abdominal pain, as well she has history of diverticulitis, so I will hold on Actemra and baricitinib. -Continue to trend inflammatory markers. -Lactic acid elevated at 3.3, procalcitonin within normal range at 0.17, will change vancomycin and cefepime to Rocephin and azithromycin -She was instructed to use incentive spirometry and flutter valve.  COPD exacerbation -Continue with IV Solu-Medrol. -Continue with scheduled Combivent  Acute on chronic diastolic CHF -BNP significantly elevated,  evidence of volume overload on imaging and physical exam, will need IV diuresis, will give 20 mg of IV Lasix given soft blood pressure, will increase as blood pressure allows  Hypertension -Her blood pressure is soft, will hold home medications  Tobacco abuse -She was counseled   DVT Prophylaxis Heparin  AM Labs Ordered, also please review Full Orders  Family Communication: Admission, patients condition and plan of care including tests being ordered have been discussed with the patient, (I have tried to call son, unable to leave a voicemail) who indicate understanding and agree with the plan and Code Status.  Code Status Full  Likely DC to  home  Condition GUARDED   Consults called: none    Admission status: inpatient    Time spent in minutes : 60 minutes   Phillips Climes M.D on 10/26/2020 at [place]  [phone]

## 2020-10-28 NOTE — ED Notes (Signed)
Date and time results received: 11/20/2020 1653 (use smartphrase ".now" to insert current time)  Test: covid Critical Value: positive  Name of Provider Notified: Kenton Kingfisher, [place]  Orders Received? Or Actions Taken?: no orders received

## 2020-10-28 NOTE — ED Provider Notes (Signed)
[facility] Provider Note   CSN: [id number] Arrival date & time: 11/02/2020  1358     History Chief Complaint  Patient presents with  . Shortness of Breath    Erica [name] is a 70 y.o. female with a past medical history of COPD and chronic hypoxic respiratory failure on 4 L via nasal cannula at baseline, history of interstitial lung disease, type 2 diabetes, chronic kidney disease stage IV, chronic diastolic heart failure who presents the emergency department with chief complaint of shortness of breath.  History is gathered by review of EMR and patient at bedside.  The patient states that this morning she was eating breakfast, when she stood up to get out of her chair she suddenly became extremely dizzy and lightheaded and extremely winded and short of breath.  Someone in her family called 911 and when EMS arrived they found her in respiratory distress with oxygen saturations in the 60s.  She was given Solu-Medrol and albuterol prior to arrival with improvement in her symptoms and is currently on 10 L of oxygen via nasal cannula without distress.  Patient states that she has had both COPD and CHF exacerbations in the past.  None of them have ever come on suddenly such as this.  She denies any fluid volume gain.  She has not had any recent URI symptoms.  The patient states that she has had approximately a 15 pound unintended weight loss over the past month.  She complains of right upper quadrant abdominal pain for the past few days but has had preceding symptoms of early satiety, loss of appetite and 1 week of constipation.  She continues to pass gas.  The pain in her right upper quadrant does not radiate.  She states that nothing seems to make it worse or better.  She has had some nausea without vomiting.  She denies previous abdominal surgeries HPI     Past Medical History:  Diagnosis Date  . Arthritis   . Chronic back pain   . CKD (chronic kidney disease), stage IV  ([place])   . COPD (chronic obstructive pulmonary disease) (HCC)    2L home O2  . Diastolic heart failure ([place])   . Essential hypertension   . History of GI bleed    Suspected AVMs  . History of stroke   . Hyperlipidemia   . Interstitial lung disease ([place])   . PSVT (paroxysmal supraventricular tachycardia) ([place])   . Type 2 diabetes mellitus [facility])     Patient Active Problem List   Diagnosis Date Noted  . Cigarette smoker 05/20/2020  . Pulmonary asbestosis ([place]) 05/19/2020  . COPD  GOLD ? / active smoker 05/19/2020  . Acute respiratory failure with hypoxia ([place]) 03/27/2020  . Anemia in stage 4 chronic kidney disease ([place]) 01/07/2020  . Abnormal mammogram 01/07/2020  . Anemia due to stage 4 chronic kidney disease ([place]) 12/31/2019  . Health care maintenance 12/31/2019  . Weight loss 12/31/2019  . Acute hyperkalemia 05/21/2019  . Acute hypoxemic respiratory failure ([place]) 09/03/2018  . Acute diastolic (congestive) heart failure ([place]) 03/09/2018  . AVM (arteriovenous malformation) of duodenum, acquired   . GIB (gastrointestinal bleeding) 01/23/2018  . CKD (chronic kidney disease) 01/23/2018  . Hyperkalemia 12/21/2017  . Hyperlipidemia 12/18/2017  . Hypomagnesemia 12/17/2017  . Acute renal failure superimposed on stage 3 chronic kidney disease ([place]) 12/07/2017  . Acute on chronic respiratory failure with hypoxia ([place]) 12/07/2017  . COPD with acute exacerbation ([place]) 12/07/2017  .  Acute diastolic CHF (congestive heart failure) ([place]) 09/19/2017  . Iron deficiency anemia due to chronic blood loss   . Acute diverticulitis 08/29/2017  . Heme positive stool   . Symptomatic anemia   . GI bleed 08/27/2017  . Elevated troponin 08/27/2017  . (HFpEF) heart failure with preserved ejection fraction ([place]) 08/27/2017  . Acute dyspnea   . Acute on chronic respiratory failure ([place]) 02/11/2017  . Acute on chronic diastolic CHF (congestive heart failure) ([place]) 02/11/2017  . Benign essential HTN  11/20/2015  . GERD (gastroesophageal reflux disease) 11/20/2015  . Tobacco abuse 11/20/2015  . Type 2 diabetes mellitus with hyperglycemia ([place]) 11/20/2015  . COPD exacerbation ([place]) 09/05/2015  . Chronic pain syndrome 09/05/2015  . Hypertension 09/05/2015  . Diabetes mellitus type 2, controlled ([place]) 09/05/2015  . Chronic respiratory failure with hypoxia and hypercapnia  complicated by cor pulmonale 09/05/2015  . OLECRANON BURSITIS, LEFT 08/04/2010    Past Surgical History:  Procedure Laterality Date  . ABDOMINAL HYSTERECTOMY    . COLONOSCOPY  2009   Dr. Gala Romney: cluster of diminutive rectal polyps and 2 diminutive polyps in rectosigmoid junction, hyperplastic  . COLONOSCOPY WITH PROPOFOL N/A 10/18/2017   Procedure: COLONOSCOPY WITH PROPOFOL;  Surgeon: Daneil Dolin, MD;  [facility];  Service: Endoscopy;  Laterality: N/A;  9:30am  . ESOPHAGOGASTRODUODENOSCOPY N/A 01/25/2018   Procedure: ESOPHAGOGASTRODUODENOSCOPY (EGD);  Surgeon: Danie Binder, MD;  Location: [facility];  Service: Endoscopy;  Laterality: N/A;  . ESOPHAGOGASTRODUODENOSCOPY (EGD) WITH PROPOFOL N/A 08/29/2017   Dr. Gala Romney with propofol: small hiatal hernia. 4cm fundal diverticulum.   Marland Kitchen GIVENS CAPSULE STUDY N/A 01/25/2018   Procedure: GIVENS CAPSULE STUDY;  Surgeon: Danie Binder, MD;  Location: [facility];  Service: Endoscopy;  Laterality: N/A;  . HAND SURGERY     In an accident at work, finger amputated     OB History    Gravida  0   Para  0   Term  0   Preterm  0   AB  0   Living        SAB  0   IAB  0   Ectopic  0   Multiple      Live Births              Family History  Problem Relation Age of Onset  . CVA Mother 84  . Cancer Neg Hx   . Colon cancer Neg Hx   . Breast cancer Neg Hx     Social History   Tobacco Use  . Smoking status: Current Some Day Smoker    Packs/day: 0.25    Years: 52.00    Pack years: 13.00    Types: Cigarettes  . Smokeless tobacco: Never  Used  Vaping Use  . Vaping Use: Never used  Substance Use Topics  . Alcohol use: No  . Drug use: No    Home Medications Prior to Admission medications   Medication Sig Start Date End Date Taking? Authorizing Provider  albuterol (VENTOLIN HFA) 108 (90 Base) MCG/ACT inhaler Inhale 1 puff into the lungs every 6 (six) hours as needed for shortness of breath. 03/25/20   [provider]  Amino Acids-Protein Hydrolys (FEEDING SUPPLEMENT, PRO-STAT SUGAR FREE 64,) LIQD Take 30 mLs by mouth 2 (two) times daily. 03/30/20   Manuella Ghazi, Pratik D, DO  amLODipine (NORVASC) 10 MG tablet Take 1 tablet by mouth.    [provider]  famotidine (PEPCID) 20 MG tablet  Take 20 mg by mouth daily.  03/29/18   [provider]  ferrous sulfate 325 (65 FE) MG EC tablet Take 1 tablet (325 mg total) by mouth 2 (two) times daily. 01/25/18 08/26/20  Isaac Bliss, Rayford Halsted, MD  guaiFENesin (MUCINEX) 600 MG 12 hr tablet Take 600 mg by mouth 2 (two) times daily as needed.    [provider]  ipratropium-albuterol (DUONEB) 0.5-2.5 (3) MG/3ML SOLN Take 3 mLs by nebulization every 6 (six) hours as needed (for shortness of breath).    [provider]  Linaclotide Rolan Lipa) 145 MCG CAPS capsule Take 145 mcg by mouth daily as needed (for constipation).     [provider]  omega-3 acid ethyl esters (LOVAZA) 1 g capsule Take 1 g by mouth daily.  12/07/16   [provider]  pantoprazole (PROTONIX) 40 MG tablet Take 1 tablet (40 mg total) by mouth daily. 01/25/18   Isaac Bliss, Rayford Halsted, MD  Tiotropium Bromide-Olodaterol (STIOLTO RESPIMAT) 2.5-2.5 MCG/ACT AERS Inhale 2 puffs into the lungs daily. 08/26/20   Tanda Rockers, MD  torsemide (DEMADEX) 20 MG tablet Take 40 mg by mouth every Monday, Wednesday, and Friday.    [provider]    Allergies    Patient has no known allergies.  Review of Systems   Review of Systems Ten systems reviewed and are negative for acute  change, except as noted in the HPI.   Physical Exam Updated Vital Signs BP (!) 106/55   Pulse 96   Temp 98.4 F (36.9 C) (Oral)   Resp (!) 22   Ht '5\' 4"'$  (1.626 m)   Wt 54.9 kg   SpO2 (!) 88%   BMI 20.77 kg/m   Physical Exam Vitals and nursing note reviewed.  Constitutional:      General: She is not in acute distress.    Appearance: She is well-developed and well-nourished. She is not diaphoretic.     Comments: Patient lying in the right lateral decubitus position  HENT:     Head: Normocephalic and atraumatic.  Eyes:     General: No scleral icterus.    Conjunctiva/sclera: Conjunctivae normal.  Cardiovascular:     Rate and Rhythm: Normal rate and regular rhythm.     Heart sounds: Normal heart sounds. No murmur heard. No friction rub. No gallop.   Pulmonary:     Effort: Pulmonary effort is normal. No respiratory distress.     Breath sounds: Wheezing and rhonchi present.  Abdominal:     General: Bowel sounds are normal. There is no distension.     Palpations: Abdomen is soft. There is no mass.     Tenderness: There is no abdominal tenderness. There is no guarding.  Musculoskeletal:     Cervical back: Normal range of motion.     Right lower leg: No edema.     Left lower leg: No edema.  Skin:    General: Skin is warm and dry.  Neurological:     Mental Status: She is alert and oriented to person, place, and time.  Psychiatric:        Behavior: Behavior normal.     ED Results / Procedures / Treatments   Labs (all labs ordered are listed, but only abnormal results are displayed) Labs Reviewed  SARS CORONAVIRUS 2 BY RT PCR ([facility])  BRAIN NATRIURETIC PEPTIDE  CBC WITH DIFFERENTIAL/PLATELET  BLOOD GAS, ARTERIAL  LIPASE, BLOOD  LACTIC ACID, PLASMA  I-STAT CHEM  8, ED    EKG EKG Interpretation  Date/Time:  Thursday October 28 2020 14:10:40 EST Ventricular Rate:  91 PR Interval:    QRS Duration: 107 QT  Interval:  399 QTC Calculation: 491 R Axis:   101 Text Interpretation: Sinus rhythm Right atrial enlargement Probable RVH w/ secondary repol abnormality Borderline prolonged QT interval Poor data quality in current ECG precludes serial comparison Confirmed by Aletta Edouard [phone]) on 10/31/2020 2:12:40 PM   Radiology No results found.  Procedures .Critical Care Performed by: Margarita Mail, PA-C Authorized by: Margarita Mail, PA-C   Critical care provider statement:    Critical care time (minutes):  45   Critical care time was exclusive of:  Separately billable procedures and treating other patients   Critical care was necessary to treat or prevent imminent or life-threatening deterioration of the following conditions:  Respiratory failure   Critical care was time spent personally by me on the following activities:  Discussions with consultants, evaluation of patient's response to treatment, examination of patient, ordering and performing treatments and interventions, ordering and review of laboratory studies, ordering and review of radiographic studies, pulse oximetry, re-evaluation of patient's condition, obtaining history from patient or surrogate and review of old charts     Medications Ordered in ED Medications - No data to display  ED Course  I have reviewed the triage vital signs and the nursing notes.  Pertinent labs & imaging results that were available during my care of the patient were reviewed by me and considered in my medical decision making (see chart for details).  Clinical Course as of 10/29/2020 1611  Thu Oct 28, 3861  6330 70 year old female here with increased shortness of breath and new oxygen requirement along with not eating for the last 2 days upper abdominal pain.  Getting labs and imaging.  Anticipate will need admission. [MB]    Clinical Course User Index [MB] Hayden Rasmussen, MD   MDM Rules/Calculators/A&P                          CC:sob/  abdominal pain VS: BP 120/60 (BP Location: Right Arm)   Pulse 87   Temp 97.8 F (36.6 C) (Oral)   Resp (!) 21   Ht '5\' 4"'$  (1.626 m)   Wt 55.1 kg   SpO2 93%   BMI 20.85 kg/m   [id number] is gathered by patient and emr. Previous records obtained and reviewed. DDX:The patient's complaint of sob and abdominal pain involves an extensive number of diagnostic and treatment options, and is a complaint that carries with it a high risk of complications, morbidity, and potential mortality. Given the large differential diagnosis, medical decision making is of high complexity. The emergent differential diagnosis for shortness of breath includes, but is not limited to, Pulmonary edema, bronchoconstriction, Pneumonia, Pulmonary embolism, Pneumotherax/ Hemothorax, Dysrythmia, ACS. The differential diagnosis for generalized abdominal pain includes, but is not limited to AAA, gastroenteritis, appendicitis, Bowel obstruction, Bowel perforation. Gastroparesis, DKA, Hernia, Inflammatory bowel disease, mesenteric ischemia, pancreatitis, peritonitis SBP, volvulus.  Labs: I ordered reviewed and interpreted labs which included a CBC which shows pancytopenia, CMP shows stable chronic renal insufficiency and mild hyperglycemia, BNP is significantly elevated, elevated lactic acid likely due to dehydration.  Covid test is positive Imaging: I ordered and reviewed images which included +1 view chest x-ray and CT abdomen pelvis without contrast as well as a pulmonary perfusion scan. I independently visualized and interpreted all imaging. Significant findings include  bilateral infiltrates versus effusions and cardiomegaly, no acute abnormalities on the CT  abdomen and pelvis.  VQ scan is pending EKG: Rhythm at a rate of 91 Consults: Dr. Waldron Labs for admission with tried regional hospitalists MDM: Patient here with acute on chronic respiratory failure.  This does not appear to be ACS.  Consideration given to pulmonary embolus as  she is Covid positive with sudden onset respiratory distress.  She is not appear to have a pneumothorax.  She could have had some flash pulmonary edema.  Patient does not appear to be in shock so I doubt cardiogenic shock as the cause.  The patient will be admitted for Covid respiratory failure with pending perfusion scan to rule out PE as the cause.  No respiratory distress at time of admission Patient disposition:The patient appears reasonably stabilized for admission considering the current resources, flow, and capabilities available in the ED at this time, and I doubt any other [facility] requiring further screening and/or treatment in the ED prior to admission.    Erica [name] was evaluated in Emergency Department on 10/29/2020 for the symptoms described in the history of present illness. She was evaluated in the context of the global COVID-19 pandemic, which necessitated consideration that the patient might be at risk for infection with the SARS-CoV-2 virus that causes COVID-19. Institutional protocols and algorithms that pertain to the evaluation of patients at risk for COVID-19 are in a state of rapid change based on information released by regulatory bodies including the CDC and federal and state organizations. These policies and algorithms were followed during the patient's care in the ED.   Final Clinical Impression(s) / ED Diagnoses Final diagnoses:  Acute on chronic respiratory failure with hypoxia ([place])  COVID-19 virus infection    Rx / DC Orders ED Discharge Orders    None       Margarita Mail, PA-C 10/29/20 1037    Hayden Rasmussen, MD 10/30/20 1143

## 2020-10-28 NOTE — ED Notes (Signed)
Date and time results received: 10/29/2020 3:45 PM  (use smartphrase ".now" to insert current time)  Test: Lactic Critical Value: 3.3  Name of Provider Notified: Dr. Melina Copa  Orders Received? Or Actions Taken?: N/a

## 2020-10-28 NOTE — ED Triage Notes (Signed)
Pt presents to ED via [facility] for increased SOB started this am. Pt on 4L of O2 chronically. Pt O2 sat upon EMS arrival 64% on 4L, given 2 duonebs, and solumedrol 125 mg.

## 2020-10-28 NOTE — ED Notes (Signed)
Patient transported to CT 

## 2020-10-29 DIAGNOSIS — N184 Chronic kidney disease, stage 4 (severe): Secondary | ICD-10-CM

## 2020-10-29 DIAGNOSIS — I5031 Acute diastolic (congestive) heart failure: Secondary | ICD-10-CM | POA: Diagnosis not present

## 2020-10-29 DIAGNOSIS — D631 Anemia in chronic kidney disease: Secondary | ICD-10-CM

## 2020-10-29 DIAGNOSIS — E1122 Type 2 diabetes mellitus with diabetic chronic kidney disease: Secondary | ICD-10-CM

## 2020-10-29 DIAGNOSIS — I5030 Unspecified diastolic (congestive) heart failure: Secondary | ICD-10-CM | POA: Diagnosis not present

## 2020-10-29 DIAGNOSIS — J1282 Pneumonia due to coronavirus disease 2019: Secondary | ICD-10-CM | POA: Diagnosis present

## 2020-10-29 DIAGNOSIS — F17210 Nicotine dependence, cigarettes, uncomplicated: Secondary | ICD-10-CM

## 2020-10-29 DIAGNOSIS — J9621 Acute and chronic respiratory failure with hypoxia: Secondary | ICD-10-CM | POA: Diagnosis not present

## 2020-10-29 DIAGNOSIS — I10 Essential (primary) hypertension: Secondary | ICD-10-CM

## 2020-10-29 DIAGNOSIS — U071 COVID-19: Secondary | ICD-10-CM | POA: Diagnosis present

## 2020-10-29 LAB — CBC WITH DIFFERENTIAL/PLATELET
Abs Immature Granulocytes: 0 10*3/uL (ref 0.00–0.07)
Basophils Absolute: 0 10*3/uL (ref 0.0–0.1)
Basophils Relative: 0 %
Eosinophils Absolute: 0 10*3/uL (ref 0.0–0.5)
Eosinophils Relative: 0 %
HCT: 34.5 % — ABNORMAL LOW (ref 36.0–46.0)
Hemoglobin: 11.3 g/dL — ABNORMAL LOW (ref 12.0–15.0)
Immature Granulocytes: 0 %
Lymphocytes Relative: 21 %
Lymphs Abs: 0.2 10*3/uL — ABNORMAL LOW (ref 0.7–4.0)
MCH: 28.6 pg (ref 26.0–34.0)
MCHC: 32.8 g/dL (ref 30.0–36.0)
MCV: 87.3 fL (ref 80.0–100.0)
Monocytes Absolute: 0.1 10*3/uL (ref 0.1–1.0)
Monocytes Relative: 6 %
Neutro Abs: 0.8 10*3/uL — ABNORMAL LOW (ref 1.7–7.7)
Neutrophils Relative %: 73 %
Platelets: 109 10*3/uL — ABNORMAL LOW (ref 150–400)
RBC: 3.95 MIL/uL (ref 3.87–5.11)
RDW: 14.6 % (ref 11.5–15.5)
WBC: 1.1 10*3/uL — CL (ref 4.0–10.5)
nRBC: 0 % (ref 0.0–0.2)

## 2020-10-29 LAB — HEPATIC FUNCTION PANEL
ALT: 21 U/L (ref 0–44)
AST: 35 U/L (ref 15–41)
Albumin: 3.3 g/dL — ABNORMAL LOW (ref 3.5–5.0)
Alkaline Phosphatase: 109 U/L (ref 38–126)
Bilirubin, Direct: 0.3 mg/dL — ABNORMAL HIGH (ref 0.0–0.2)
Indirect Bilirubin: 0.6 mg/dL (ref 0.3–0.9)
Total Bilirubin: 0.9 mg/dL (ref 0.3–1.2)
Total Protein: 6.3 g/dL — ABNORMAL LOW (ref 6.5–8.1)

## 2020-10-29 LAB — GLUCOSE, CAPILLARY
Glucose-Capillary: 183 mg/dL — ABNORMAL HIGH (ref 70–99)
Glucose-Capillary: 128 mg/dL — ABNORMAL HIGH (ref 70–99)
Glucose-Capillary: 138 mg/dL — ABNORMAL HIGH (ref 70–99)
Glucose-Capillary: 131 mg/dL — ABNORMAL HIGH (ref 70–99)
Glucose-Capillary: 131 mg/dL — ABNORMAL HIGH (ref 70–99)

## 2020-10-29 LAB — COMPREHENSIVE METABOLIC PANEL
ALT: 22 U/L (ref 0–44)
AST: 35 U/L (ref 15–41)
Albumin: 3.3 g/dL — ABNORMAL LOW (ref 3.5–5.0)
Alkaline Phosphatase: 110 U/L (ref 38–126)
Anion gap: 17 — ABNORMAL HIGH (ref 5–15)
BUN: 81 mg/dL — ABNORMAL HIGH (ref 8–23)
CO2: 28 mmol/L (ref 22–32)
Calcium: 8.1 mg/dL — ABNORMAL LOW (ref 8.9–10.3)
Chloride: 96 mmol/L — ABNORMAL LOW (ref 98–111)
Creatinine, Ser: 2.99 mg/dL — ABNORMAL HIGH (ref 0.44–1.00)
GFR, Estimated: 16 mL/min — ABNORMAL LOW (ref >60)
Glucose, Bld: 180 mg/dL — ABNORMAL HIGH (ref 70–99)
Potassium: 4.1 mmol/L (ref 3.5–5.1)
Sodium: 141 mmol/L (ref 135–145)
Total Bilirubin: 0.9 mg/dL (ref 0.3–1.2)
Total Protein: 6.3 g/dL — ABNORMAL LOW (ref 6.5–8.1)

## 2020-10-29 LAB — C-REACTIVE PROTEIN: CRP: 3.4 mg/dL — ABNORMAL HIGH (ref <1.0)

## 2020-10-29 LAB — HEMOGLOBIN A1C
Hgb A1c MFr Bld: 5.3 % (ref 4.8–5.6)
Mean Plasma Glucose: 105.41 mg/dL

## 2020-10-29 LAB — PROCALCITONIN: Procalcitonin: 0.18 ng/mL

## 2020-10-29 LAB — D-DIMER, QUANTITATIVE: D-Dimer, Quant: 1.16 ug/mL-FEU — ABNORMAL HIGH (ref 0.00–0.50)

## 2020-10-29 LAB — MRSA PCR SCREENING: MRSA by PCR: NEGATIVE

## 2020-10-29 MED ORDER — FERROUS SULFATE 325 (65 FE) MG PO TABS
325.0000 mg | ORAL_TABLET | Freq: Three times a day (TID) | ORAL | Status: DC
  Administered 2020-10-29 – 2020-11-09 (×33): 325 mg via ORAL
  Filled 2020-10-29 (×32): qty 1

## 2020-10-29 MED ORDER — FUROSEMIDE 10 MG/ML IJ SOLN
20.0000 mg | Freq: Two times a day (BID) | INTRAMUSCULAR | Status: DC
  Administered 2020-10-29 (×2): 20 mg via INTRAVENOUS
  Filled 2020-10-29 (×2): qty 2

## 2020-10-29 MED ORDER — INSULIN ASPART 100 UNIT/ML ~~LOC~~ SOLN
0.0000 [IU] | Freq: Three times a day (TID) | SUBCUTANEOUS | Status: DC
  Administered 2020-10-29 (×3): 1 [IU] via SUBCUTANEOUS
  Administered 2020-10-30: 2 [IU] via SUBCUTANEOUS
  Administered 2020-10-30 – 2020-10-31 (×2): 3 [IU] via SUBCUTANEOUS
  Administered 2020-10-31 – 2020-11-01 (×4): 1 [IU] via SUBCUTANEOUS
  Administered 2020-11-02: 2 [IU] via SUBCUTANEOUS
  Administered 2020-11-02 (×2): 1 [IU] via SUBCUTANEOUS
  Administered 2020-11-03 (×2): 2 [IU] via SUBCUTANEOUS
  Administered 2020-11-03: 1 [IU] via SUBCUTANEOUS
  Administered 2020-11-05: 2 [IU] via SUBCUTANEOUS
  Administered 2020-11-05 – 2020-11-08 (×7): 1 [IU] via SUBCUTANEOUS

## 2020-10-29 MED ORDER — CHLORHEXIDINE GLUCONATE CLOTH 2 % EX PADS
6.0000 | MEDICATED_PAD | Freq: Every day | CUTANEOUS | Status: DC
  Administered 2020-10-29 – 2020-11-09 (×8): 6 via TOPICAL

## 2020-10-29 MED ORDER — ORAL CARE MOUTH RINSE
15.0000 mL | Freq: Two times a day (BID) | OROMUCOSAL | Status: DC
  Administered 2020-10-29 – 2020-11-09 (×24): 15 mL via OROMUCOSAL

## 2020-10-29 NOTE — Progress Notes (Signed)
PROGRESS NOTE    Erica [name]  [id number] DOB: 21-Oct-1950 DOA: 10/29/2020 PCP: [facility]    Brief Narrative:  Erica [name] is a 70 year old female with past medical history significant for COPD/chronic hypoxic respiratory failure on 2 L nasal cannula at baseline, interstitial lung disease, type 2 diabetes mellitus, chronic kidney disease stage IV, chronic diastolic congestive heart failure who presented to the ED with progressive shortness of breath. Patient also with associated dizziness, cough, increased work of breathing. Patient denies any productive cough/phlegm, no fever/chills, no recent weight gain or lower extremity edema. Patient reports vaccinated against Cova-19, but has not yet received a booster dose.  In the ED, temperature 98.4, HR 93, RR 12, BP 114/61, SPO2 77% on 4 L nasal cannula. Patient was placed on high flow nasal cannula 15 L/min with SPO2 88%. ABG with pH 7.45/PaCO2 46.6/PaO2 57.2. Sodium 137, potassium 3.8, chloride 93, glucose 129, BUN 82, creatinine 3.30. BNP 2370. WBC 2.5, hemoglobin 11.8, platelets 117. SARS-CoV-2 positive. Chest x-ray with cardiomegaly, vascular congestion and small bilateral pleural effusions with right lung base atelectasis versus developing infiltrate. Hospitalist service consulted for further evaluation and management of acute on chronic hypoxic respite failure secondary to Covid-19 viral pneumonia, COPD exacerbation, diastolic congestive heart failure exacerbation and community-acquired pneumonia; in addition to acute on chronic renal failure.   Assessment & Plan:   Active Problems:   COPD exacerbation ([place])   Hypertension   Diabetes mellitus type 2, controlled ([place])   Acute on chronic respiratory failure (HCC)   (HFpEF) heart failure with preserved ejection fraction (HCC)   COPD with acute exacerbation (HCC)   Hyperkalemia   Acute diastolic (congestive) heart failure (HCC)   Anemia due to stage 4  chronic kidney disease (HCC)   Cigarette smoker   Acute on chronic hypoxic respiratory failure secondary to acute Covid-19 viral pneumonia during the ongoing Covid 19 Pandemic - POA Patient presenting to the ED with progressive shortness of breath. SARS-CoV-2 positive. Elevated CRP 3.4 with D-dimer 1.04. Patient is vaccinated against Cova-19 but has not received booster dose. --COVID test: + 11/18/2020 --CRP 3.4 --ddimer 1.04 --Remdesivir, plan 5-day course (Day #2/5) --Continue Solumedrol '80mg'$  IV q8h --We will avoid baricitinib/Actemra given superimposed bacterial pneumonia --prone for 2-3hrs every 12hrs if able --Continue supplemental oxygen, titrate to maintain SPO2 greater than 88%, on 15LHFNC --Continue supportive care with albuterol MDI prn, vitamin C, zinc, Tylenol, antitussives (benzonatate/ Mucinex/Tussionex) --Follow CBC, CMP, D-dimer, ferritin, and CRP daily --Continue airborne/contact isolation precautions for 3 weeks from the day of diagnosis  The treatment plan and use of medications and known side effects were discussed with patient/family. Some of the medications used are based on case reports/anecdotal data.  All other medications being used in the management of COVID-19 based on limited study data.  Complete risks and long-term side effects are unknown, however in the best clinical judgment they seem to be of some benefit.  Patient wanted to proceed with treatment options provided.  Community acquired pneumonia, POA Chest x-ray with right lung base infiltrates. Nuclear medicine VQ scan negative for pulmonary embolism. --Azithromycin 500 mg IV every 24 hours --Ceftriaxone 1 g IV every 24 hours --Continue supplemental oxygen as above  Acute on chronic respiratory failure, POA COPD exacerbation Interstitial lung disease --Continue azithromycin as above --Continue Solu-Medrol as above --Combivent 1 puff inh q6h --Continue submental oxygen, maintain SPO2 greater than 88%,  on 15 L high flow nasal cannula (baseline 4 L [place])  Acute renal failure on CKD stage IV Creatinine baseline 2.3 - 2.45. Creatinine elevated at 3.28 on admission. Etiology likely secondary to volume overload with elevated BNP and vascular congestion noted on chest x-ray. --Hold home lisinopril --Furosemide 20 mg IV every 12 hours --Avoid nephrotoxins, renally dose all medications --Strict I's and O's and daily weights  Type 2 diabetes mellitus Hemoglobin A1c 5.3, well-controlled. Diet controlled at home.  Essential hypertension On amlodipine 10 mg p.o. daily, lisinopril 2.5 mg p.o. daily at home.  GERD: Continue PPI  Iron deficiency anemia: Continue ferrous sulfate '325mg'$  PO TID  DVT prophylaxis: Heparin   Code Status: Full Code Family Communication: Updated patient extensively at bedside  Disposition Plan:  Level of care: Stepdown Status is: Inpatient  Remains inpatient appropriate because:Ongoing diagnostic testing needed not appropriate for outpatient work up, Unsafe d/c plan, IV treatments appropriate due to intensity of illness or inability to take PO and Inpatient level of care appropriate due to severity of illness   Dispo: The patient is from: Home              Anticipated d/c is to: Home              Anticipated d/c date is: > 3 days              Patient currently is not medically stable to d/c.   Difficult to place patient No    Consultants:   None  Procedures:   Nuclear medicine VQ scan  Antimicrobials:   Azithromycin 2/3>>  Ceftriaxone 2/3>>  Cefepime 2/3 - 2/3  Vancomycin 2/3 - 2/3  Metronidazole 2/3 - 2/3   Subjective: Patient seen and examined bedside, resting comfortably. Sleeping but easily arousable. States breathing improved this morning since presentation yesterday, although continues on 15 L high flow nasal cannula which is far above her normal baseline of 4 L nasal cannula. No other questions or concerns at this time. Denies headache, no  fever/chills/night sweats, no nausea/vomiting/diarrhea, no chest pain, no palpitations, no abdominal pain, no weakness, no fatigue, no paresthesias. No acute events overnight per nursing staff.  Objective: Vitals:   10/29/20 0000 10/29/20 0200 10/29/20 0400 10/29/20 0800  BP: (!) 88/62 (!) 112/58 (!) 132/58 120/60  Pulse: (!) 103 87 87   Resp: (!) '23 19 17 '$ (!) 21  Temp:  98 F (36.7 C) 98.1 F (36.7 C) 97.8 F (36.6 C)  TempSrc:  Oral Oral Oral  SpO2: 99% 99% 100% 93%  Weight:  55.1 kg    Height:        Intake/Output Summary (Last 24 hours) at 10/29/2020 1033 Last data filed at 10/29/2020 0400 Gross per 24 hour  Intake 970.56 ml  Output --  Net 970.56 ml   Filed Weights   11/06/2020 1402 10/29/20 0200  Weight: 54.9 kg 55.1 kg    Examination:  General exam: Appears calm and comfortable, chronically ill in appearance Respiratory system: Decreased breath sounds bilateral bases with mild crackles, normal respiratory effort, no accessory muscle use, on 15 L high flow nasal cannula (baseline 4LNC) Cardiovascular system: S1 & S2 heard, RRR. No JVD, murmurs, rubs, gallops or clicks. No pedal edema. Gastrointestinal system: Abdomen is nondistended, soft and nontender. No organomegaly or masses felt. Normal bowel sounds heard. Central nervous system: Alert and oriented. No focal neurological deficits. Extremities: Symmetric 5 x 5 power. Skin: No rashes, lesions or ulcers Psychiatry: Judgement and insight appear poor. Mood & affect appropriate.     Data Reviewed:  I have personally reviewed following labs and imaging studies  CBC: Recent Labs  Lab 11/16/2020 1500 11/16/2020 1504 10/29/20 0251  WBC 2.5*  --  1.1*  NEUTROABS 2.0  --  0.8*  HGB 11.8* 12.6 11.3*  HCT 37.3 37.0 34.5*  MCV 89.9  --  87.3  PLT 117*  --  0000000*   Basic Metabolic Panel: Recent Labs  Lab 10/31/2020 1504 11/16/2020 1644  NA 137 139  K 3.8 3.7  CL 93* 93*  CO2  --  31  GLUCOSE 129* 151*  BUN 82* 84*   CREATININE 3.30* 3.28*  CALCIUM  --  8.6*   GFR: Estimated Creatinine Clearance: 13.6 mL/min (A) (by C-G formula based on SCr of 3.28 mg/dL (H)). Liver Function Tests: Recent Labs  Lab 11/11/2020 1644  AST 33  ALT 20  ALKPHOS 94  BILITOT 1.2  PROT 6.9  ALBUMIN 3.7   Recent Labs  Lab 11/19/2020 1500  LIPASE 26   No results for input(s): AMMONIA in the last 168 hours. Coagulation Profile: Recent Labs  Lab 11/19/2020 1644  INR 1.1   Cardiac Enzymes: No results for input(s): CKTOTAL, CKMB, CKMBINDEX, TROPONINI in the last 168 hours. BNP (last 3 results) No results for input(s): PROBNP in the last 8760 hours. HbA1C: Recent Labs    10/29/20 0251  HGBA1C 5.3   CBG: Recent Labs  Lab 10/29/20 0130 10/29/20 0801  GLUCAP 183* 128*   Lipid Profile: Recent Labs    11/21/2020 1819  TRIG 102   Thyroid Function Tests: No results for input(s): TSH, T4TOTAL, FREET4, T3FREE, THYROIDAB in the last 72 hours. Anemia Panel: Recent Labs    11/22/2020 1819  FERRITIN 110   Sepsis Labs: Recent Labs  Lab 10/26/2020 1500 11/16/2020 1819  PROCALCITON  --  0.17  LATICACIDVEN 3.3*  --     Recent Results (from the past 240 hour(s))  SARS Coronavirus 2 by RT PCR (hospital order, performed in [facility] hospital lab) Nasopharyngeal Nasopharyngeal Swab     Status: Abnormal   Collection Time: 11/05/2020  2:24 PM   Specimen: Nasopharyngeal Swab  Result Value Ref Range Status   SARS Coronavirus 2 POSITIVE (A) NEGATIVE Final    Comment: RESULT CALLED TO, READ BACK BY AND VERIFIED WITH: BELTON,C ON 10/26/2020 AT 1650 BY LOY,C (NOTE) SARS-CoV-2 target nucleic acids are DETECTED  SARS-CoV-2 RNA is generally detectable in upper respiratory specimens  during the acute phase of infection.  Positive results are indicative  of the presence of the identified virus, but do not rule out bacterial infection or co-infection with other pathogens not detected by the test.  Clinical correlation with  patient history and  other diagnostic information is necessary to determine patient infection status.  The expected result is negative.  Fact Sheet for Patients:   StrictlyIdeas.no   Fact Sheet for Healthcare Providers:   BankingDealers.co.za    This test is not yet approved or cleared by the [place] FDA and  has been authorized for detection and/or diagnosis of SARS-CoV-2 by FDA under an Emergency Use Authorization (EUA).  This EUA will remain in effect (meaning this t est can be used) for the duration of  the COVID-19 declaration under Section 564(b)(1) of the Act, 21 U.S.C. section 360-bbb-3(b)(1), unless the authorization is terminated or revoked sooner.  Performed at [facility], [address] [place], [place] [address]   MRSA PCR Screening     Status: None   Collection Time: 10/29/20 12:53 AM  Specimen: Nasopharyngeal  Result Value Ref Range Status   MRSA by PCR NEGATIVE NEGATIVE Final    Comment:        The GeneXpert MRSA Assay (FDA approved for NASAL specimens only), is one component of a comprehensive MRSA colonization surveillance program. It is not intended to diagnose MRSA infection nor to guide or monitor treatment for MRSA infections. Performed at [facility], [place] [address] [place],  [address]          Radiology Studies: CT ABDOMEN PELVIS WO CONTRAST  Result Date: 10/30/2020 CLINICAL DATA:  70 year old female with abdominal pain. EXAM: CT ABDOMEN AND PELVIS WITHOUT CONTRAST TECHNIQUE: Multidetector CT imaging of the abdomen and pelvis was performed following the standard protocol without IV contrast. COMPARISON:  CT abdomen pelvis dated 08/27/2017. FINDINGS: Evaluation of this exam is limited in the absence of intravenous contrast. Lower chest: Diffuse bilateral interstitial coarsening, progressed since the prior CT. There is a 1.7 x 2.5 cm right lung base subpleural nodule  which may represent complex pleural effusion or an area of rounded atelectasis. A mass is not excluded. Dedicated chest CT is recommended for better evaluation on a nonemergent/outpatient basis. Additional 11 x 20 mm nodular density at the right lung base (6/2) likely an area of scarring. There is cardiomegaly. Coronary vascular calcification. No intra-abdominal free air.  Small ascites. Hepatobiliary: The liver is unremarkable. No intrahepatic biliary dilatation. The gallbladder is distended. No calcified gallstone. Pancreas: Unremarkable. No pancreatic ductal dilatation or surrounding inflammatory changes. Spleen: Normal in size without focal abnormality. Adrenals/Urinary Tract: The adrenal glands unremarkable. Atrophic right kidney. Vascular calcification versus punctate nonobstructing left renal upper pole calculus. No hydronephrosis. The left ureter and urinary bladder appear unremarkable. Stomach/Bowel: There is sigmoid diverticulosis without active inflammatory changes. There is no bowel obstruction or active inflammation. The appendix is not visualized with certainty. No inflammatory changes identified in the right lower quadrant. Vascular/Lymphatic: Advanced aortoiliac atherosclerotic disease. The IVC is unremarkable. No portal venous gas. There is no adenopathy. Reproductive: Hysterectomy.  No adnexal masses. Other: Midline vertical anterior pelvic wall incisional scar. No fluid collection. Musculoskeletal: Osteopenia with degenerative changes of the spine. No acute osseous pathology. IMPRESSION: 1. No acute intra-abdominal or pelvic pathology. 2. Sigmoid diverticulosis. No bowel obstruction. 3. Atrophic right kidney. 4. Small ascites. 5. Right lung base subpleural nodule. Dedicated chest CT is recommended for better evaluation on a nonemergent/outpatient basis. 6. Aortic Atherosclerosis (ICD10-I70.0). Electronically Signed   By: Anner Crete M.D.   On: 11/15/2020 17:04   NM Pulmonary  Perfusion  Result Date: 11/04/2020 CLINICAL DATA:  Respiratory failure. Acute on chronic respiratory failure. EXAM: NUCLEAR MEDICINE PERFUSION LUNG SCAN TECHNIQUE: Perfusion images were obtained in multiple projections after intravenous injection of radiopharmaceutical. Ventilation scans intentionally deferred if perfusion scan and chest x-ray adequate for interpretation during COVID 19 epidemic. RADIOPHARMACEUTICALS:  4.2 mCi Tc-14mMAA IV COMPARISON:  Chest radiograph earlier today. FINDINGS: No wedge-shaped perfusion defects to suggest pulmonary embolus. Minimal defect in the periphery of the right mid lung corresponds to small amount of fluid in the fissure on radiograph. Cardiomegaly. IMPRESSION: No scintigraphic evidence of pulmonary embolus. Electronically Signed   By: MKeith RakeM.D.   On: 11/21/2020 18:00   DG Chest Port 1 View  Result Date: 10/26/2020 CLINICAL DATA:  70year old female with shortness of breath. EXAM: PORTABLE CHEST 1 VIEW COMPARISON:  Chest radiograph dated 03/28/2020. FINDINGS: There is cardiomegaly with vascular congestion. There is diffuse chronic interstitial coarsening in keeping with  interstitial lung disease. There are small bilateral pleural effusions. Calcified right pleural plaque. Slight increased density at the right lung base may represent atelectasis. Developing infiltrate is not excluded. There is no pneumothorax. Atherosclerotic calcification of the aorta. No acute osseous pathology. IMPRESSION: 1. Cardiomegaly with vascular congestion and small bilateral pleural effusions. 2. Background of interstitial lung disease. 3. Right lung base atelectasis versus developing infiltrate. Electronically Signed   By: Anner Crete M.D.   On: 11/07/2020 15:42        Scheduled Meds: . Chlorhexidine Gluconate Cloth  6 each Topical Daily  . fentaNYL (SUBLIMAZE) injection  50 mcg Intravenous Once  . furosemide  20 mg Intravenous Q12H  . heparin injection  (subcutaneous)  5,000 Units Subcutaneous Q8H  . insulin aspart  0-9 Units Subcutaneous TID WC  . Ipratropium-Albuterol  1 puff Inhalation Q6H  . mouth rinse  15 mL Mouth Rinse BID  . methylPREDNISolone (SOLU-MEDROL) injection  80 mg Intravenous Q8H  . ondansetron (ZOFRAN) IV  4 mg Intravenous Once  . pantoprazole  40 mg Oral Daily   Continuous Infusions: . azithromycin Stopped (10/30/2020 2359)  . cefTRIAXone (ROCEPHIN)  IV    . lactated ringers 150 mL/hr at 10/29/20 0957  . remdesivir 100 mg in NS 100 mL 100 mg (10/29/20 0950)     LOS: 1 day    Time spent: 46 minutes spent on chart review, discussion with nursing staff, consultants, updating family and interview/physical exam; more than 50% of that time was spent in counseling and/or coordination of care.    Toshiye Kever J [place] DO [facility] Available via Epic secure chat 7am-7pm After these hours, please refer to coverage provider listed on [email or web address] 10/29/2020, 10:33 AM

## 2020-10-29 NOTE — Plan of Care (Signed)
  Problem: Education: Goal: Knowledge of General Education information will improve Description: Including pain rating scale, medication(s)/side effects and non-pharmacologic comfort measures Outcome: Progressing   Problem: Health Behavior/Discharge Planning: Goal: Ability to manage health-related needs will improve Outcome: Progressing   Problem: Clinical Measurements: Goal: Ability to maintain clinical measurements within normal limits will improve Outcome: Progressing Goal: Will remain free from infection Outcome: Progressing Goal: Diagnostic test results will improve Outcome: Progressing Goal: Respiratory complications will improve Outcome: Progressing Goal: Cardiovascular complication will be avoided Outcome: Progressing   Problem: Activity: Goal: Risk for activity intolerance will decrease Outcome: Progressing   Problem: Nutrition: Goal: Adequate nutrition will be maintained Outcome: Progressing   Problem: Coping: Goal: Level of anxiety will decrease Outcome: Progressing   Problem: Elimination: Goal: Will not experience complications related to bowel motility Outcome: Progressing Goal: Will not experience complications related to urinary retention Outcome: Progressing   Problem: Pain Managment: Goal: General experience of comfort will improve Outcome: Progressing   Problem: Safety: Goal: Ability to remain free from injury will improve Outcome: Progressing   Problem: Skin Integrity: Goal: Risk for impaired skin integrity will decrease Outcome: Progressing   Problem: Education: Goal: Knowledge of risk factors and measures for prevention of condition will improve Outcome: Progressing   Problem: Coping: Goal: Psychosocial and spiritual needs will be supported Outcome: Progressing   Problem: Respiratory: Goal: Will maintain a patent airway Outcome: Progressing Goal: Complications related to the disease process, condition or treatment will be avoided or  minimized Outcome: Progressing   Problem: Education: Goal: Knowledge of disease or condition will improve Outcome: Progressing Goal: Knowledge of the prescribed therapeutic regimen will improve Outcome: Progressing Goal: Individualized Educational Video(s) Outcome: Progressing   Problem: Activity: Goal: Ability to tolerate increased activity will improve Outcome: Progressing Goal: Will verbalize the importance of balancing activity with adequate rest periods Outcome: Progressing   Problem: Respiratory: Goal: Ability to maintain a clear airway will improve Outcome: Progressing Goal: Levels of oxygenation will improve Outcome: Progressing Goal: Ability to maintain adequate ventilation will improve Outcome: Progressing

## 2020-10-29 NOTE — Progress Notes (Signed)
CRITICAL VALUE ALERT  Critical Value:  WBC 1.1  Date & Time Notied:  10/29/2020 04:10 AM   Provider Notified: Lorra Hals   Orders Received/Actions taken: No new orders at current time

## 2020-10-30 DIAGNOSIS — J9621 Acute and chronic respiratory failure with hypoxia: Secondary | ICD-10-CM | POA: Diagnosis not present

## 2020-10-30 DIAGNOSIS — I5031 Acute diastolic (congestive) heart failure: Secondary | ICD-10-CM | POA: Diagnosis not present

## 2020-10-30 DIAGNOSIS — N170 Acute kidney failure with tubular necrosis: Secondary | ICD-10-CM | POA: Diagnosis not present

## 2020-10-30 DIAGNOSIS — J1282 Pneumonia due to coronavirus disease 2019: Secondary | ICD-10-CM

## 2020-10-30 DIAGNOSIS — E875 Hyperkalemia: Secondary | ICD-10-CM

## 2020-10-30 DIAGNOSIS — I5030 Unspecified diastolic (congestive) heart failure: Secondary | ICD-10-CM | POA: Diagnosis not present

## 2020-10-30 DIAGNOSIS — U071 COVID-19: Principal | ICD-10-CM

## 2020-10-30 LAB — COMPREHENSIVE METABOLIC PANEL
ALT: 20 U/L (ref 0–44)
AST: 26 U/L (ref 15–41)
Albumin: 3.3 g/dL — ABNORMAL LOW (ref 3.5–5.0)
Alkaline Phosphatase: 93 U/L (ref 38–126)
Anion gap: 13 (ref 5–15)
BUN: 84 mg/dL — ABNORMAL HIGH (ref 8–23)
CO2: 30 mmol/L (ref 22–32)
Calcium: 7.8 mg/dL — ABNORMAL LOW (ref 8.9–10.3)
Chloride: 95 mmol/L — ABNORMAL LOW (ref 98–111)
Creatinine, Ser: 2.7 mg/dL — ABNORMAL HIGH (ref 0.44–1.00)
GFR, Estimated: 18 mL/min — ABNORMAL LOW (ref >60)
Glucose, Bld: 130 mg/dL — ABNORMAL HIGH (ref 70–99)
Potassium: 4.5 mmol/L (ref 3.5–5.1)
Sodium: 138 mmol/L (ref 135–145)
Total Bilirubin: 0.9 mg/dL (ref 0.3–1.2)
Total Protein: 6.2 g/dL — ABNORMAL LOW (ref 6.5–8.1)

## 2020-10-30 LAB — GLUCOSE, CAPILLARY
Glucose-Capillary: 113 mg/dL — ABNORMAL HIGH (ref 70–99)
Glucose-Capillary: 177 mg/dL — ABNORMAL HIGH (ref 70–99)
Glucose-Capillary: 201 mg/dL — ABNORMAL HIGH (ref 70–99)
Glucose-Capillary: 77 mg/dL (ref 70–99)

## 2020-10-30 LAB — CBC WITH DIFFERENTIAL/PLATELET
Abs Immature Granulocytes: 0.01 10*3/uL (ref 0.00–0.07)
Basophils Absolute: 0 10*3/uL (ref 0.0–0.1)
Basophils Relative: 0 %
Eosinophils Absolute: 0 10*3/uL (ref 0.0–0.5)
Eosinophils Relative: 0 %
HCT: 34.7 % — ABNORMAL LOW (ref 36.0–46.0)
Hemoglobin: 11.4 g/dL — ABNORMAL LOW (ref 12.0–15.0)
Immature Granulocytes: 0 %
Lymphocytes Relative: 9 %
Lymphs Abs: 0.4 10*3/uL — ABNORMAL LOW (ref 0.7–4.0)
MCH: 28.7 pg (ref 26.0–34.0)
MCHC: 32.9 g/dL (ref 30.0–36.0)
MCV: 87.4 fL (ref 80.0–100.0)
Monocytes Absolute: 0.3 10*3/uL (ref 0.1–1.0)
Monocytes Relative: 6 %
Neutro Abs: 3.3 10*3/uL (ref 1.7–7.7)
Neutrophils Relative %: 85 %
Platelets: 133 10*3/uL — ABNORMAL LOW (ref 150–400)
RBC: 3.97 MIL/uL (ref 3.87–5.11)
RDW: 14.5 % (ref 11.5–15.5)
WBC: 4 10*3/uL (ref 4.0–10.5)
nRBC: 0 % (ref 0.0–0.2)

## 2020-10-30 LAB — PROCALCITONIN: Procalcitonin: 0.17 ng/mL

## 2020-10-30 LAB — D-DIMER, QUANTITATIVE: D-Dimer, Quant: 1.23 ug/mL-FEU — ABNORMAL HIGH (ref 0.00–0.50)

## 2020-10-30 LAB — C-REACTIVE PROTEIN: CRP: 2.1 mg/dL — ABNORMAL HIGH (ref <1.0)

## 2020-10-30 MED ORDER — GUAIFENESIN-DM 100-10 MG/5ML PO SYRP
5.0000 mL | ORAL_SOLUTION | ORAL | Status: DC | PRN
  Administered 2020-10-30: 5 mL via ORAL
  Filled 2020-10-30 (×2): qty 10

## 2020-10-30 MED ORDER — FUROSEMIDE 10 MG/ML IJ SOLN
40.0000 mg | Freq: Two times a day (BID) | INTRAMUSCULAR | Status: DC
  Administered 2020-10-30 – 2020-11-01 (×6): 40 mg via INTRAVENOUS
  Filled 2020-10-30 (×6): qty 4

## 2020-10-30 MED ORDER — AZITHROMYCIN 250 MG PO TABS
500.0000 mg | ORAL_TABLET | Freq: Every day | ORAL | Status: DC
  Administered 2020-10-30 – 2020-11-01 (×3): 500 mg via ORAL
  Filled 2020-10-30 (×3): qty 2

## 2020-10-30 MED ORDER — OXYCODONE HCL 5 MG PO TABS
5.0000 mg | ORAL_TABLET | Freq: Four times a day (QID) | ORAL | Status: DC | PRN
  Administered 2020-10-30 – 2020-10-31 (×2): 5 mg via ORAL
  Filled 2020-10-30 (×3): qty 1

## 2020-10-30 MED ORDER — ACETAMINOPHEN 325 MG PO TABS
650.0000 mg | ORAL_TABLET | Freq: Four times a day (QID) | ORAL | Status: DC | PRN
  Administered 2020-10-31: 650 mg via ORAL
  Filled 2020-10-30 (×2): qty 2

## 2020-10-30 NOTE — Progress Notes (Signed)
PHARMACIST - PHYSICIAN COMMUNICATION  CONCERNING: Antibiotic IV to Oral Route Change Policy  RECOMMENDATION: This patient is receiving azithromycin by the intravenous route.  Based on criteria approved by the Pharmacy and Therapeutics Committee, the antibiotic(s) is/are being converted to the equivalent oral dose form(s).   DESCRIPTION: These criteria include:  Patient being treated for a respiratory tract infection, urinary tract infection, cellulitis or clostridium difficile associated diarrhea if on metronidazole  The patient is not neutropenic and does not exhibit a GI malabsorption state  The patient is eating (either orally or via tube) and/or has been taking other orally administered medications for a least 24 hours  The patient is improving clinically and has a Tmax < 100.5  If you have questions about this conversion, please contact the Pharmacy Department  []  ( [phone] )  [place] []  ( [phone]  [facility] []  ( [phone]  [facility] []  ( [phone]  [facility] [x]  ( [phone] )  [facility]  

## 2020-10-30 NOTE — Plan of Care (Signed)
Patient experienced 3 episodes of desaturation below 70 (63, 69, 68) but was able to recover by turning the HF up to 15L. It required 2-3 minutes for her to stabilize at 94 before she could be returned to 8L. Patient had a medium bowel movement and experienced chest pain from gas that was relieved after some tummy massaging and a huge burp. Patient is awaiting transfer to lower level of care.    Problem: Clinical Measurements: Goal: Respiratory complications will improve Outcome: Progressing   Problem: Activity: Goal: Risk for activity intolerance will decrease Outcome: Progressing   Problem: Nutrition: Goal: Adequate nutrition will be maintained Outcome: Progressing   Problem: Coping: Goal: Level of anxiety will decrease Outcome: Progressing

## 2020-10-30 NOTE — Progress Notes (Signed)
PROGRESS NOTE    Erica [name]  [id number] DOB: [birth year] DOA: 11/14/2020 PCP: [facility]    Brief Narrative:  Erica [name] is a 70 year old female with past medical history significant for COPD/chronic hypoxic respiratory failure on 2 L nasal cannula at baseline, interstitial lung disease, type 2 diabetes mellitus, chronic kidney disease stage IV, chronic diastolic congestive heart failure who presented to the ED with progressive shortness of breath. Patient also with associated dizziness, cough, increased work of breathing. Patient denies any productive cough/phlegm, no fever/chills, no recent weight gain or lower extremity edema. Patient reports vaccinated against Cova-19, but has not yet received a booster dose.  In the ED, temperature 98.4, HR 93, RR 12, BP 114/61, SPO2 77% on 4 L nasal cannula. Patient was placed on high flow nasal cannula 15 L/min with SPO2 88%. ABG with pH 7.45/PaCO2 46.6/PaO2 57.2. Sodium 137, potassium 3.8, chloride 93, glucose 129, BUN 82, creatinine 3.30. BNP 2370. WBC 2.5, hemoglobin 11.8, platelets 117. SARS-CoV-2 positive. Chest x-ray with cardiomegaly, vascular congestion and small bilateral pleural effusions with right lung base atelectasis versus developing infiltrate. Hospitalist service consulted for further evaluation and management of acute on chronic hypoxic respite failure secondary to Covid-19 viral pneumonia, COPD exacerbation, diastolic congestive heart failure exacerbation and community-acquired pneumonia; in addition to acute on chronic renal failure.   Assessment & Plan:   Principal Problem:   Acute on chronic respiratory failure (HCC) Active Problems:   COPD exacerbation (HCC)   Hypertension   Diabetes mellitus type 2, controlled ([place])   (HFpEF) heart failure with preserved ejection fraction (HCC)   Acute renal failure superimposed on stage 4 chronic kidney disease (HCC)   COPD with acute exacerbation  (HCC)   Hyperkalemia   Acute diastolic (congestive) heart failure (HCC)   Anemia due to stage 4 chronic kidney disease (HCC)   Cigarette smoker   Pneumonia due to COVID-19 virus   Acute on chronic hypoxic respiratory failure secondary to acute Covid-19 viral pneumonia during the ongoing Covid 19 Pandemic - POA Patient presenting to the ED with progressive shortness of breath. SARS-CoV-2 positive. Elevated CRP 3.4 with D-dimer 1.04. Patient is vaccinated against Cova-19 but has not received booster dose. --COVID test: + 11/09/2020 --CRP 3.4>2.1 --ddimer 1.04>1.23 --Remdesivir, plan 5-day course (Day #3/5) --Continue Solumedrol '80mg'$  IV q8h --Avoiding baricitinib/Actemra given superimposed bacterial pneumonia --prone for 2-3hrs every 12hrs if able --Continue supplemental oxygen, titrate to maintain SPO2 greater than 88%, on 8LHFNC (baseline 4LNC) --Continue supportive care with albuterol MDI prn, vitamin C, zinc, Tylenol, antitussives (benzonatate/ Mucinex/Tussionex) --Follow CBC, CMP, D-dimer, ferritin, and CRP daily --Continue airborne/contact isolation precautions   The treatment plan and use of medications and known side effects were discussed with patient/family. Some of the medications used are based on case reports/anecdotal data.  All other medications being used in the management of COVID-19 based on limited study data.  Complete risks and long-term side effects are unknown, however in the best clinical judgment they seem to be of some benefit.  Patient wanted to proceed with treatment options provided.  Community acquired pneumonia, POA Chest x-ray with right lung base infiltrates. Nuclear medicine VQ scan negative for pulmonary embolism. --Azithromycin 500 mg IV every 24 hours --Ceftriaxone 1 g IV every 24 hours --Continue supplemental oxygen as above  Acute on chronic respiratory failure, POA COPD exacerbation Interstitial lung disease --Continue azithromycin as  above --Continue Solu-Medrol as above --Combivent 1 puff inh q6h --Continue submental oxygen, maintain SPO2  greater than 88%, on 8 L high flow nasal cannula (baseline 4 L [place])  Acute renal failure on CKD stage IV Creatinine baseline 2.3 - 2.45. Creatinine elevated at 3.28 on admission. Etiology likely secondary to volume overload with elevated BNP and vascular congestion noted on chest x-ray. --Cr 3.28>2.70 --Hold home lisinopril --Furosemide 20 mg IV every 12 hours --Avoid nephrotoxins, renally dose all medications --Strict I's and O's and daily weights  Type 2 diabetes mellitus Hemoglobin A1c 5.3, well-controlled. Diet controlled at home.  Essential hypertension On amlodipine 10 mg p.o. daily, lisinopril 2.5 mg p.o. daily at home. --holding antihypertensives for borderline hypotension  GERD: Continue PPI  Iron deficiency anemia: Continue ferrous sulfate '325mg'$  PO TID  DVT prophylaxis: Heparin   Code Status: Full Code Family Communication: Updated patient extensively at bedside  Disposition Plan:  Level of care: Stepdown, stable to transfer to progressive care Status is: Inpatient  Remains inpatient appropriate because:Ongoing diagnostic testing needed not appropriate for outpatient work up, Unsafe d/c plan, IV treatments appropriate due to intensity of illness or inability to take PO and Inpatient level of care appropriate due to severity of illness   Dispo: The patient is from: Home              Anticipated d/c is to: Home              Anticipated d/c date is: > 3 days              Patient currently is not medically stable to d/c.   Difficult to place patient No    Consultants:   None  Procedures:   Nuclear medicine VQ scan  Antimicrobials:   Azithromycin 2/3>>  Ceftriaxone 2/3>>  Cefepime 2/3 - 2/3  Vancomycin 2/3 - 2/3  Metronidazole 2/3 - 2/3   Subjective: Patient seen and examined bedside, resting comfortably.  No specific complaints, has been  titrated down from 15 L high flow nasal cannula at 8 L high flow nasal cannula this morning.  No other questions or concerns at this time. Denies headache, no fever/chills/night sweats, no nausea/vomiting/diarrhea, no chest pain, no palpitations, no abdominal pain, no weakness, no fatigue, no paresthesias. No acute events overnight per nursing staff.  Objective: Vitals:   10/30/20 0300 10/30/20 0400 10/30/20 0500 10/30/20 0800  BP:  107/67  105/71  Pulse:  91 91 92  Resp: (!) '23 17 16 19  '$ Temp:  98.2 F (36.8 C)  98.1 F (36.7 C)  TempSrc:  Oral  Oral  SpO2:  100% 100% 94%  Weight:      Height:        Intake/Output Summary (Last 24 hours) at 10/30/2020 0943 Last data filed at 10/30/2020 0400 Gross per 24 hour  Intake 446.93 ml  Output 600 ml  Net -153.07 ml   Filed Weights   11/08/2020 1402 10/29/20 0200  Weight: 54.9 kg 55.1 kg    Examination:  General exam: Appears calm and comfortable, chronically ill in appearance Respiratory system: Decreased breath sounds bilateral bases with mild crackles, normal respiratory effort, no accessory muscle use, on 8 L high flow nasal cannula (baseline 4LNC) Cardiovascular system: S1 & S2 heard, RRR. No JVD, murmurs, rubs, gallops or clicks. No pedal edema. Gastrointestinal system: Abdomen is nondistended, soft and nontender. No organomegaly or masses felt. Normal bowel sounds heard. Central nervous system: Alert and oriented. No focal neurological deficits. Extremities: Symmetric 5 x 5 power. Skin: No rashes, lesions or ulcers Psychiatry: Judgement and insight  appear poor. Mood & affect appropriate.     Data Reviewed: I have personally reviewed following labs and imaging studies  CBC: Recent Labs  Lab 11/04/2020 1500 11/08/2020 1504 10/29/20 0251 10/30/20 0249  WBC 2.5*  --  1.1* 4.0  NEUTROABS 2.0  --  0.8* 3.3  HGB 11.8* 12.6 11.3* 11.4*  HCT 37.3 37.0 34.5* 34.7*  MCV 89.9  --  87.3 87.4  PLT 117*  --  109* [id number]*   Basic Metabolic  Panel: Recent Labs  Lab 11/03/2020 1504 11/03/2020 1644 10/29/20 0251 10/30/20 0249  NA 137 139 141 138  K 3.8 3.7 4.1 4.5  CL 93* 93* 96* 95*  CO2  --  '31 28 30  '$ GLUCOSE 129* 151* 180* 130*  BUN 82* 84* 81* 84*  CREATININE 3.30* 3.28* 2.99* 2.70*  CALCIUM  --  8.6* 8.1* 7.8*   GFR: Estimated Creatinine Clearance: 16.5 mL/min (A) (by C-G formula based on SCr of 2.7 mg/dL (H)). Liver Function Tests: Recent Labs  Lab 10/31/2020 1644 10/29/20 0251 10/30/20 0249  AST 33 35  35 26  ALT '20 21  22 20  '$ ALKPHOS 94 109  110 93  BILITOT 1.2 0.9  0.9 0.9  PROT 6.9 6.3*  6.3* 6.2*  ALBUMIN 3.7 3.3*  3.3* 3.3*   Recent Labs  Lab 11/18/2020 1500  LIPASE 26   No results for input(s): AMMONIA in the last 168 hours. Coagulation Profile: Recent Labs  Lab 11/06/2020 1644  INR 1.1   Cardiac Enzymes: No results for input(s): CKTOTAL, CKMB, CKMBINDEX, TROPONINI in the last 168 hours. BNP (last 3 results) No results for input(s): PROBNP in the last 8760 hours. HbA1C: Recent Labs    10/29/20 0251  HGBA1C 5.3   CBG: Recent Labs  Lab 10/29/20 0801 10/29/20 1114 10/29/20 1634 10/29/20 2137 10/30/20 0737  GLUCAP 128* 138* 131* 131* 113*   Lipid Profile: Recent Labs    11/03/2020 1819  TRIG 102   Thyroid Function Tests: No results for input(s): TSH, T4TOTAL, FREET4, T3FREE, THYROIDAB in the last 72 hours. Anemia Panel: Recent Labs    10/26/2020 1819  FERRITIN 110   Sepsis Labs: Recent Labs  Lab 10/30/2020 1500 11/11/2020 1819 10/29/20 0251 10/30/20 0249  PROCALCITON  --  0.17 0.18 0.17  LATICACIDVEN 3.3*  --   --   --     Recent Results (from the past 240 hour(s))  SARS Coronavirus 2 by RT PCR (hospital order, performed in [place] hospital lab) Nasopharyngeal Nasopharyngeal Swab     Status: Abnormal   Collection Time: 11/06/2020  2:24 PM   Specimen: Nasopharyngeal Swab  Result Value Ref Range Status   SARS Coronavirus 2 POSITIVE (A) NEGATIVE Final    Comment:  RESULT CALLED TO, READ BACK BY AND VERIFIED WITH: BELTON,C ON 10/30/2020 AT 1650 BY LOY,C (NOTE) SARS-CoV-2 target nucleic acids are DETECTED  SARS-CoV-2 RNA is generally detectable in upper respiratory specimens  during the acute phase of infection.  Positive results are indicative  of the presence of the identified virus, but do not rule out bacterial infection or co-infection with other pathogens not detected by the test.  Clinical correlation with patient history and  other diagnostic information is necessary to determine patient infection status.  The expected result is negative.  Fact Sheet for Patients:   StrictlyIdeas.no   Fact Sheet for Healthcare Providers:   BankingDealers.co.za    This test is not yet approved or cleared by the [place] FDA and  has been authorized for detection and/or diagnosis of SARS-CoV-2 by FDA under an Emergency Use Authorization (EUA).  This EUA will remain in effect (meaning this t est can be used) for the duration of  the COVID-19 declaration under Section 564(b)(1) of the Act, 21 U.S.C. section 360-bbb-3(b)(1), unless the authorization is terminated or revoked sooner.  Performed at [facility], [address], [place], [place] [address]   Blood Culture (routine x 2)     Status: None (Preliminary result)   Collection Time: 11/22/2020  4:44 PM   Specimen: BLOOD LEFT ARM  Result Value Ref Range Status   Specimen Description BLOOD LEFT ARM  Final   Special Requests   Final    BOTTLES DRAWN AEROBIC AND ANAEROBIC Blood Culture adequate volume   Culture   Final    NO GROWTH 2 DAYS Performed at [facility], [address], [place], [place] [address]    Report Status PENDING  Incomplete  Blood Culture (routine x 2)     Status: None (Preliminary result)   Collection Time: 11/07/2020  4:44 PM   Specimen: BLOOD RIGHT FOREARM  Result Value Ref Range Status   Specimen Description BLOOD RIGHT FOREARM   Final   Special Requests   Final    BOTTLES DRAWN AEROBIC AND ANAEROBIC Blood Culture adequate volume   Culture   Final    NO GROWTH 2 DAYS Performed at [facility], [address], [place], [place] [address]    Report Status PENDING  Incomplete  MRSA PCR Screening     Status: None   Collection Time: 10/29/20 12:53 AM   Specimen: Nasopharyngeal  Result Value Ref Range Status   MRSA by PCR NEGATIVE NEGATIVE Final    Comment:        The GeneXpert MRSA Assay (FDA approved for NASAL specimens only), is one component of a comprehensive MRSA colonization surveillance program. It is not intended to diagnose MRSA infection nor to guide or monitor treatment for MRSA infections. Performed at [facility], [place] [address], [place],  [address]          Radiology Studies: CT ABDOMEN PELVIS WO CONTRAST  Result Date: 11/04/2020 CLINICAL DATA:  70 year old female with abdominal pain. EXAM: CT ABDOMEN AND PELVIS WITHOUT CONTRAST TECHNIQUE: Multidetector CT imaging of the abdomen and pelvis was performed following the standard protocol without IV contrast. COMPARISON:  CT abdomen pelvis dated 08/27/2017. FINDINGS: Evaluation of this exam is limited in the absence of intravenous contrast. Lower chest: Diffuse bilateral interstitial coarsening, progressed since the prior CT. There is a 1.7 x 2.5 cm right lung base subpleural nodule which may represent complex pleural effusion or an area of rounded atelectasis. A mass is not excluded. Dedicated chest CT is recommended for better evaluation on a nonemergent/outpatient basis. Additional 11 x 20 mm nodular density at the right lung base (6/2) likely an area of scarring. There is cardiomegaly. Coronary vascular calcification. No intra-abdominal free air.  Small ascites. Hepatobiliary: The liver is unremarkable. No intrahepatic biliary dilatation. The gallbladder is distended. No calcified gallstone. Pancreas: Unremarkable. No  pancreatic ductal dilatation or surrounding inflammatory changes. Spleen: Normal in size without focal abnormality. Adrenals/Urinary Tract: The adrenal glands unremarkable. Atrophic right kidney. Vascular calcification versus punctate nonobstructing left renal upper pole calculus. No hydronephrosis. The left ureter and urinary bladder appear unremarkable. Stomach/Bowel: There is sigmoid diverticulosis without active inflammatory changes. There is no bowel obstruction or active inflammation. The appendix is not visualized with certainty. No inflammatory  changes identified in the right lower quadrant. Vascular/Lymphatic: Advanced aortoiliac atherosclerotic disease. The IVC is unremarkable. No portal venous gas. There is no adenopathy. Reproductive: Hysterectomy.  No adnexal masses. Other: Midline vertical anterior pelvic wall incisional scar. No fluid collection. Musculoskeletal: Osteopenia with degenerative changes of the spine. No acute osseous pathology. IMPRESSION: 1. No acute intra-abdominal or pelvic pathology. 2. Sigmoid diverticulosis. No bowel obstruction. 3. Atrophic right kidney. 4. Small ascites. 5. Right lung base subpleural nodule. Dedicated chest CT is recommended for better evaluation on a nonemergent/outpatient basis. 6. Aortic Atherosclerosis (ICD10-I70.0). Electronically Signed   By: Anner Crete M.D.   On: 11/07/2020 17:04   NM Pulmonary Perfusion  Result Date: 11/01/2020 CLINICAL DATA:  Respiratory failure. Acute on chronic respiratory failure. EXAM: NUCLEAR MEDICINE PERFUSION LUNG SCAN TECHNIQUE: Perfusion images were obtained in multiple projections after intravenous injection of radiopharmaceutical. Ventilation scans intentionally deferred if perfusion scan and chest x-ray adequate for interpretation during COVID 19 epidemic. RADIOPHARMACEUTICALS:  4.2 mCi Tc-74mMAA IV COMPARISON:  Chest radiograph earlier today. FINDINGS: No wedge-shaped perfusion defects to suggest pulmonary embolus.  Minimal defect in the periphery of the right mid lung corresponds to small amount of fluid in the fissure on radiograph. Cardiomegaly. IMPRESSION: No scintigraphic evidence of pulmonary embolus. Electronically Signed   By: MKeith RakeM.D.   On: 11/05/2020 18:00   DG Chest Port 1 View  Result Date: 10/27/2020 CLINICAL DATA:  70year old female with shortness of breath. EXAM: PORTABLE CHEST 1 VIEW COMPARISON:  Chest radiograph dated 03/28/2020. FINDINGS: There is cardiomegaly with vascular congestion. There is diffuse chronic interstitial coarsening in keeping with interstitial lung disease. There are small bilateral pleural effusions. Calcified right pleural plaque. Slight increased density at the right lung base may represent atelectasis. Developing infiltrate is not excluded. There is no pneumothorax. Atherosclerotic calcification of the aorta. No acute osseous pathology. IMPRESSION: 1. Cardiomegaly with vascular congestion and small bilateral pleural effusions. 2. Background of interstitial lung disease. 3. Right lung base atelectasis versus developing infiltrate. Electronically Signed   By: AAnner CreteM.D.   On: 11/01/2020 15:42        Scheduled Meds: . Chlorhexidine Gluconate Cloth  6 each Topical Daily  . fentaNYL (SUBLIMAZE) injection  50 mcg Intravenous Once  . ferrous sulfate  325 mg Oral TID  . furosemide  40 mg Intravenous Q12H  . heparin injection (subcutaneous)  5,000 Units Subcutaneous Q8H  . insulin aspart  0-9 Units Subcutaneous TID WC  . Ipratropium-Albuterol  1 puff Inhalation Q6H  . mouth rinse  15 mL Mouth Rinse BID  . methylPREDNISolone (SOLU-MEDROL) injection  80 mg Intravenous Q8H  . ondansetron (ZOFRAN) IV  4 mg Intravenous Once  . pantoprazole  40 mg Oral Daily   Continuous Infusions: . azithromycin Stopped (10/29/20 2054)  . cefTRIAXone (ROCEPHIN)  IV 1 g (10/30/20 0904)  . remdesivir 100 mg in NS 100 mL Stopped (10/29/20 1042)     LOS: 2 days     Time spent: 39 minutes spent on chart review, discussion with nursing staff, consultants, updating family and interview/physical exam; more than 50% of that time was spent in counseling and/or coordination of care.    Labrittany Wechter J [place]rit[place] DO [facility] Available via Epic secure chat 7am-7pm After these hours, please refer to coverage provider listed on [email or web address] 10/30/2020, 9:43 AM

## 2020-10-31 DIAGNOSIS — J9621 Acute and chronic respiratory failure with hypoxia: Secondary | ICD-10-CM | POA: Diagnosis not present

## 2020-10-31 DIAGNOSIS — N170 Acute kidney failure with tubular necrosis: Secondary | ICD-10-CM | POA: Diagnosis not present

## 2020-10-31 DIAGNOSIS — I5030 Unspecified diastolic (congestive) heart failure: Secondary | ICD-10-CM | POA: Diagnosis not present

## 2020-10-31 DIAGNOSIS — I5031 Acute diastolic (congestive) heart failure: Secondary | ICD-10-CM | POA: Diagnosis not present

## 2020-10-31 LAB — GLUCOSE, CAPILLARY
Glucose-Capillary: 127 mg/dL — ABNORMAL HIGH (ref 70–99)
Glucose-Capillary: 131 mg/dL — ABNORMAL HIGH (ref 70–99)
Glucose-Capillary: 132 mg/dL — ABNORMAL HIGH (ref 70–99)
Glucose-Capillary: 140 mg/dL — ABNORMAL HIGH (ref 70–99)
Glucose-Capillary: 197 mg/dL — ABNORMAL HIGH (ref 70–99)
Glucose-Capillary: 97 mg/dL (ref 70–99)

## 2020-10-31 LAB — CBC WITH DIFFERENTIAL/PLATELET
Abs Immature Granulocytes: 0.02 10*3/uL (ref 0.00–0.07)
Basophils Absolute: 0 10*3/uL (ref 0.0–0.1)
Basophils Relative: 0 %
Eosinophils Absolute: 0 10*3/uL (ref 0.0–0.5)
Eosinophils Relative: 0 %
HCT: 36.6 % (ref 36.0–46.0)
Hemoglobin: 12 g/dL (ref 12.0–15.0)
Immature Granulocytes: 0 %
Lymphocytes Relative: 4 %
Lymphs Abs: 0.3 10*3/uL — ABNORMAL LOW (ref 0.7–4.0)
MCH: 28.7 pg (ref 26.0–34.0)
MCHC: 32.8 g/dL (ref 30.0–36.0)
MCV: 87.6 fL (ref 80.0–100.0)
Monocytes Absolute: 0.2 10*3/uL (ref 0.1–1.0)
Monocytes Relative: 3 %
Neutro Abs: 7.4 10*3/uL (ref 1.7–7.7)
Neutrophils Relative %: 93 %
Platelets: 152 10*3/uL (ref 150–400)
RBC: 4.18 MIL/uL (ref 3.87–5.11)
RDW: 14.8 % (ref 11.5–15.5)
WBC: 7.9 10*3/uL (ref 4.0–10.5)
nRBC: 0 % (ref 0.0–0.2)

## 2020-10-31 LAB — COMPREHENSIVE METABOLIC PANEL
ALT: 25 U/L (ref 0–44)
AST: 29 U/L (ref 15–41)
Albumin: 3.4 g/dL — ABNORMAL LOW (ref 3.5–5.0)
Alkaline Phosphatase: 100 U/L (ref 38–126)
Anion gap: 15 (ref 5–15)
BUN: 88 mg/dL — ABNORMAL HIGH (ref 8–23)
CO2: 30 mmol/L (ref 22–32)
Calcium: 7.7 mg/dL — ABNORMAL LOW (ref 8.9–10.3)
Chloride: 96 mmol/L — ABNORMAL LOW (ref 98–111)
Creatinine, Ser: 2.39 mg/dL — ABNORMAL HIGH (ref 0.44–1.00)
GFR, Estimated: 21 mL/min — ABNORMAL LOW (ref >60)
Glucose, Bld: 121 mg/dL — ABNORMAL HIGH (ref 70–99)
Potassium: 4.1 mmol/L (ref 3.5–5.1)
Sodium: 141 mmol/L (ref 135–145)
Total Bilirubin: 0.9 mg/dL (ref 0.3–1.2)
Total Protein: 6.2 g/dL — ABNORMAL LOW (ref 6.5–8.1)

## 2020-10-31 LAB — D-DIMER, QUANTITATIVE: D-Dimer, Quant: 1.12 ug/mL-FEU — ABNORMAL HIGH (ref 0.00–0.50)

## 2020-10-31 LAB — C-REACTIVE PROTEIN: CRP: 1.4 mg/dL — ABNORMAL HIGH (ref <1.0)

## 2020-10-31 MED ORDER — PROCHLORPERAZINE EDISYLATE 10 MG/2ML IJ SOLN
10.0000 mg | Freq: Once | INTRAMUSCULAR | Status: AC
  Administered 2020-10-31: 10 mg via INTRAVENOUS
  Filled 2020-10-31: qty 2

## 2020-10-31 MED ORDER — ONDANSETRON HCL 4 MG/2ML IJ SOLN: 4.0000 mg | Freq: Once | INTRAMUSCULAR | Status: DC

## 2020-10-31 MED ORDER — METHYLPREDNISOLONE SODIUM SUCC 125 MG IJ SOLR
80.0000 mg | Freq: Two times a day (BID) | INTRAMUSCULAR | Status: DC
  Administered 2020-10-31 – 2020-11-02 (×4): 80 mg via INTRAVENOUS
  Filled 2020-10-31 (×4): qty 2

## 2020-10-31 NOTE — Progress Notes (Signed)
PROGRESS NOTE    Erica [name]  [id number] DOB: 1951/09/20 DOA: 11/15/2020 PCP: [facility]    Brief Narrative:  Erica [name] is a 70 year old female with past medical history significant for COPD/chronic hypoxic respiratory failure on 2 L nasal cannula at baseline, interstitial lung disease, type 2 diabetes mellitus, chronic kidney disease stage IV, chronic diastolic congestive heart failure who presented to the ED with progressive shortness of breath. Patient also with associated dizziness, cough, increased work of breathing. Patient denies any productive cough/phlegm, no fever/chills, no recent weight gain or lower extremity edema. Patient reports vaccinated against Cova-19, but has not yet received a booster dose.  In the ED, temperature 98.4, HR 93, RR 12, BP 114/61, SPO2 77% on 4 L nasal cannula. Patient was placed on high flow nasal cannula 15 L/min with SPO2 88%. ABG with pH 7.45/PaCO2 46.6/PaO2 57.2. Sodium 137, potassium 3.8, chloride 93, glucose 129, BUN 82, creatinine 3.30. BNP 2370. WBC 2.5, hemoglobin 11.8, platelets 117. SARS-CoV-2 positive. Chest x-ray with cardiomegaly, vascular congestion and small bilateral pleural effusions with right lung base atelectasis versus developing infiltrate. Hospitalist service consulted for further evaluation and management of acute on chronic hypoxic respite failure secondary to Covid-19 viral pneumonia, COPD exacerbation, diastolic congestive heart failure exacerbation and community-acquired pneumonia; in addition to acute on chronic renal failure.   Assessment & Plan:   Principal Problem:   Acute on chronic respiratory failure (HCC) Active Problems:   COPD exacerbation (HCC)   Hypertension   Diabetes mellitus type 2, controlled ([place])   (HFpEF) heart failure with preserved ejection fraction (HCC)   Acute renal failure superimposed on stage 4 chronic kidney disease (HCC)   COPD with acute exacerbation  (HCC)   Hyperkalemia   Acute diastolic (congestive) heart failure (HCC)   Anemia due to stage 4 chronic kidney disease (HCC)   Cigarette smoker   Pneumonia due to COVID-19 virus   Acute on chronic hypoxic respiratory failure secondary to acute Covid-19 viral pneumonia during the ongoing Covid 19 Pandemic - POA Patient presenting to the ED with progressive shortness of breath. SARS-CoV-2 positive. Elevated CRP 3.4 with D-dimer 1.04. Patient is vaccinated against Cova-19 but has not received booster dose. --COVID test: + 11/06/2020 --CRP 3.4>2.1>1.4 --ddimer 1.04>1.23>1.12 --Remdesivir, plan 5-day course (Day #4/5) --Solumedrol '80mg'$  IV q12h --Avoiding baricitinib/Actemra given superimposed bacterial pneumonia --prone for 2-3hrs every 12hrs if able --Continue supplemental oxygen, titrate to maintain SPO2 greater than 88%, on 8LHFNC (baseline 4LNC) --Continue supportive care with albuterol MDI prn, vitamin C, zinc, Tylenol, antitussives (benzonatate/ Mucinex/Tussionex) --Follow CBC, CMP, D-dimer, and CRP daily --Continue airborne/contact isolation precautions   The treatment plan and use of medications and known side effects were discussed with patient/family. Some of the medications used are based on case reports/anecdotal data.  All other medications being used in the management of COVID-19 based on limited study data.  Complete risks and long-term side effects are unknown, however in the best clinical judgment they seem to be of some benefit.  Patient wanted to proceed with treatment options provided.  Community acquired pneumonia, POA Chest x-ray with right lung base infiltrates. Nuclear medicine VQ scan negative for pulmonary embolism. --Azithromycin 500 mg PO q24h, plan 5 day course --Ceftriaxone 1 g IV every 24 hours --Continue supplemental oxygen as above  Acute on chronic respiratory failure, POA COPD exacerbation Interstitial lung disease --Continue azithromycin as  above --Continue Solu-Medrol as above --Combivent 1 puff inh q6h --Continue submental oxygen, maintain SPO2  greater than 88%, on 8 L high flow nasal cannula (baseline 4 L )  Acute renal failure on CKD stage IV Creatinine baseline 2.3 - 2.45. Creatinine elevated at 3.28 on admission. Etiology likely secondary to volume overload with elevated BNP and vascular congestion noted on chest x-ray. --Cr 3.28>2.70>2.39 --Hold home lisinopril --Furosemide 40 mg IV every 12 hours --Avoid nephrotoxins, renally dose all medications --Strict I's and O's and daily weights  Type 2 diabetes mellitus Hemoglobin A1c 5.3, well-controlled. Diet controlled at home.  Essential hypertension On amlodipine 10 mg p.o. daily, lisinopril 2.5 mg p.o. daily at home. --holding antihypertensives for borderline hypotension  GERD: Continue PPI  Iron deficiency anemia: Continue ferrous sulfate '325mg'$  PO TID  DVT prophylaxis: Heparin   Code Status: Full Code Family Communication: Updated patient extensively at bedside  Disposition Plan:  Level of care: Progressive, stable to transfer to progressive care Status is: Inpatient  Remains inpatient appropriate because:Ongoing diagnostic testing needed not appropriate for outpatient work up, Unsafe d/c plan, IV treatments appropriate due to intensity of illness or inability to take PO and Inpatient level of care appropriate due to severity of illness   Dispo: The patient is from: Home              Anticipated d/c is to: Home              Anticipated d/c date is: > 3 days              Patient currently is not medically stable to d/c.   Difficult to place patient No    Consultants:   None  Procedures:   Nuclear medicine VQ scan  Antimicrobials:   Azithromycin 2/3>>  Ceftriaxone 2/3>>  Cefepime 2/3 - 2/3  Vancomycin 2/3 - 2/3  Metronidazole 2/3 - 2/3   Subjective: Patient seen and examined bedside, resting comfortably.  No specific complaints,  continues on 8 L high flow nasal cannula this morning.  States overall breathing is much improved.  No other questions or concerns at this time. Denies headache, no fever/chills/night sweats, no nausea/vomiting/diarrhea, no chest pain, no palpitations, no abdominal pain, no weakness, no fatigue, no paresthesias. No acute events overnight per nursing staff.  Objective: Vitals:   10/31/20 0325 10/31/20 0400 10/31/20 0652 10/31/20 0800  BP:  97/65 107/71 (!) 99/58  Pulse:  88 89   Resp:  '14 15 14  '$ Temp: 97.8 F (36.6 C)   97.7 F (36.5 C)  TempSrc: Oral   Oral  SpO2:  98% 100%   Weight:      Height:        Intake/Output Summary (Last 24 hours) at 10/31/2020 1025 Last data filed at 10/30/2020 1900 Gross per 24 hour  Intake 199.99 ml  Output 901 ml  Net -701.01 ml   Filed Weights   10/31/2020 1402 10/29/20 0200  Weight: 54.9 kg 55.1 kg    Examination:  General exam: Appears calm and comfortable, chronically ill in appearance Respiratory system: Decreased breath sounds bilateral bases with mild crackles, normal respiratory effort, no accessory muscle use, on 8 L high flow nasal cannula (baseline 4LNC) Cardiovascular system: S1 & S2 heard, RRR. No JVD, murmurs, rubs, gallops or clicks. No pedal edema. Gastrointestinal system: Abdomen is nondistended, soft and nontender. No organomegaly or masses felt. Normal bowel sounds heard. Central nervous system: Alert and oriented. No focal neurological deficits. Extremities: Symmetric 5 x 5 power. Skin: No rashes, lesions or ulcers Psychiatry: Judgement and insight appear poor. Mood &  affect appropriate.     Data Reviewed: I have personally reviewed following labs and imaging studies  CBC: Recent Labs  Lab 11/11/2020 1500 11/22/2020 1504 10/29/20 0251 10/30/20 0249 10/31/20 0236  WBC 2.5*  --  1.1* 4.0 7.9  NEUTROABS 2.0  --  0.8* 3.3 7.4  HGB 11.8* 12.6 11.3* 11.4* 12.0  HCT 37.3 37.0 34.5* 34.7* 36.6  MCV 89.9  --  87.3 87.4 87.6  PLT  117*  --  109* 133* 0000000   Basic Metabolic Panel: Recent Labs  Lab 11/12/2020 1504 11/18/2020 1644 10/29/20 0251 10/30/20 0249 10/31/20 0236  NA 137 139 141 138 141  K 3.8 3.7 4.1 4.5 4.1  CL 93* 93* 96* 95* 96*  CO2  --  '31 28 30 30  '$ GLUCOSE 129* 151* 180* 130* 121*  BUN 82* 84* 81* 84* 88*  CREATININE 3.30* 3.28* 2.99* 2.70* 2.39*  CALCIUM  --  8.6* 8.1* 7.8* 7.7*   GFR: Estimated Creatinine Clearance: 18.6 mL/min (A) (by C-G formula based on SCr of 2.39 mg/dL (H)). Liver Function Tests: Recent Labs  Lab 10/27/2020 1644 10/29/20 0251 10/30/20 0249 10/31/20 0236  AST 33 35  35 26 29  ALT '20 21  22 20 25  '$ ALKPHOS 94 109  110 93 100  BILITOT 1.2 0.9  0.9 0.9 0.9  PROT 6.9 6.3*  6.3* 6.2* 6.2*  ALBUMIN 3.7 3.3*  3.3* 3.3* 3.4*   Recent Labs  Lab 11/07/2020 1500  LIPASE 26   No results for input(s): AMMONIA in the last 168 hours. Coagulation Profile: Recent Labs  Lab 11/19/2020 1644  INR 1.1   Cardiac Enzymes: No results for input(s): CKTOTAL, CKMB, CKMBINDEX, TROPONINI in the last 168 hours. BNP (last 3 results) No results for input(s): PROBNP in the last 8760 hours. HbA1C: Recent Labs    10/29/20 0251  HGBA1C 5.3   CBG: Recent Labs  Lab 10/30/20 1123 10/30/20 1621 10/30/20 2134 10/31/20 0010 10/31/20 0709  GLUCAP 177* 201* 77 97 127*   Lipid Profile: Recent Labs    11/07/2020 1819  TRIG 102   Thyroid Function Tests: No results for input(s): TSH, T4TOTAL, FREET4, T3FREE, THYROIDAB in the last 72 hours. Anemia Panel: Recent Labs    11/15/2020 1819  FERRITIN 110   Sepsis Labs: Recent Labs  Lab 11/05/2020 1500 11/19/2020 1819 10/29/20 0251 10/30/20 0249  PROCALCITON  --  0.17 0.18 0.17  LATICACIDVEN 3.3*  --   --   --     Recent Results (from the past 240 hour(s))  SARS Coronavirus 2 by RT PCR (hospital order, performed in [place] hospital lab) Nasopharyngeal Nasopharyngeal Swab     Status: Abnormal   Collection Time: 10/31/2020  2:24 PM    Specimen: Nasopharyngeal Swab  Result Value Ref Range Status   SARS Coronavirus 2 POSITIVE (A) NEGATIVE Final    Comment: RESULT CALLED TO, READ BACK BY AND VERIFIED WITH: BELTON,C ON 10/26/2020 AT 1650 BY LOY,C (NOTE) SARS-CoV-2 target nucleic acids are DETECTED  SARS-CoV-2 RNA is generally detectable in upper respiratory specimens  during the acute phase of infection.  Positive results are indicative  of the presence of the identified virus, but do not rule out bacterial infection or co-infection with other pathogens not detected by the test.  Clinical correlation with patient history and  other diagnostic information is necessary to determine patient infection status.  The expected result is negative.  Fact Sheet for Patients:   StrictlyIdeas.no   Fact Sheet for  Healthcare Providers:   BankingDealers.co.za    This test is not yet approved or cleared by the [place] and  has been authorized for detection and/or diagnosis of SARS-CoV-2 by FDA under an Emergency Use Authorization (EUA).  This EUA will remain in effect (meaning this t est can be used) for the duration of  the COVID-19 declaration under Section 564(b)(1) of the Act, 21 U.S.C. section 360-bbb-3(b)(1), unless the authorization is terminated or revoked sooner.  Performed at [facility], [address] [place], [place] [address]   Blood Culture (routine x 2)     Status: None (Preliminary result)   Collection Time: 11/21/2020  4:44 PM   Specimen: BLOOD LEFT ARM  Result Value Ref Range Status   Specimen Description BLOOD LEFT ARM  Final   Special Requests   Final    BOTTLES DRAWN AEROBIC AND ANAEROBIC Blood Culture adequate volume   Culture   Final    NO GROWTH 2 DAYS Performed at [facility], [address], [place], [place] [address]    Report Status PENDING  Incomplete  Blood Culture (routine x 2)     Status: None (Preliminary result)   Collection Time:  11/02/2020  4:44 PM   Specimen: BLOOD RIGHT FOREARM  Result Value Ref Range Status   Specimen Description BLOOD RIGHT FOREARM  Final   Special Requests   Final    BOTTLES DRAWN AEROBIC AND ANAEROBIC Blood Culture adequate volume   Culture   Final    NO GROWTH 2 DAYS Performed at [facility], [address], [place], [place] [address]    Report Status PENDING  Incomplete  MRSA PCR Screening     Status: None   Collection Time: 10/29/20 12:53 AM   Specimen: Nasopharyngeal  Result Value Ref Range Status   MRSA by PCR NEGATIVE NEGATIVE Final    Comment:        The GeneXpert MRSA Assay (FDA approved for NASAL specimens only), is one component of a comprehensive MRSA colonization surveillance program. It is not intended to diagnose MRSA infection nor to guide or monitor treatment for MRSA infections. Performed at [facility], [place] [address], [place],  [address]          Radiology Studies: No results found.      Scheduled Meds: . azithromycin  500 mg Oral QHS  . Chlorhexidine Gluconate Cloth  6 each Topical Daily  . ferrous sulfate  325 mg Oral TID  . furosemide  40 mg Intravenous Q12H  . heparin injection (subcutaneous)  5,000 Units Subcutaneous Q8H  . insulin aspart  0-9 Units Subcutaneous TID WC  . Ipratropium-Albuterol  1 puff Inhalation Q6H  . mouth rinse  15 mL Mouth Rinse BID  . methylPREDNISolone (SOLU-MEDROL) injection  80 mg Intravenous Q8H  . pantoprazole  40 mg Oral Daily   Continuous Infusions: . cefTRIAXone (ROCEPHIN)  IV Stopped (10/30/20 0955)  . remdesivir 100 mg in NS 100 mL 100 mg (10/31/20 0957)     LOS: 3 days    Time spent: 39 minutes spent on chart review, discussion with nursing staff, consultants, updating family and interview/physical exam; more than 50% of that time was spent in counseling and/or coordination of care.    Aziel Morgan J [place] DO [facility] Available via Epic secure chat 7am-7pm After  these hours, please refer to coverage provider listed on [email or web address] 10/31/2020, 10:25 AM

## 2020-10-31 NOTE — Progress Notes (Signed)
Patient CBG=77 at 21:34, asymptomatic, pt said she eat small portion of her dinner only. Encouraged to have a crackers and drink juice. Retook CBG=97.

## 2020-11-01 DIAGNOSIS — N170 Acute kidney failure with tubular necrosis: Secondary | ICD-10-CM | POA: Diagnosis not present

## 2020-11-01 DIAGNOSIS — J9621 Acute and chronic respiratory failure with hypoxia: Secondary | ICD-10-CM | POA: Diagnosis not present

## 2020-11-01 DIAGNOSIS — I5030 Unspecified diastolic (congestive) heart failure: Secondary | ICD-10-CM | POA: Diagnosis not present

## 2020-11-01 DIAGNOSIS — I5031 Acute diastolic (congestive) heart failure: Secondary | ICD-10-CM | POA: Diagnosis not present

## 2020-11-01 LAB — COMPREHENSIVE METABOLIC PANEL
ALT: 28 U/L (ref 0–44)
AST: 27 U/L (ref 15–41)
Albumin: 3.4 g/dL — ABNORMAL LOW (ref 3.5–5.0)
Alkaline Phosphatase: 92 U/L (ref 38–126)
Anion gap: 18 — ABNORMAL HIGH (ref 5–15)
BUN: 99 mg/dL — ABNORMAL HIGH (ref 8–23)
CO2: 29 mmol/L (ref 22–32)
Calcium: 7.9 mg/dL — ABNORMAL LOW (ref 8.9–10.3)
Chloride: 93 mmol/L — ABNORMAL LOW (ref 98–111)
Creatinine, Ser: 2.89 mg/dL — ABNORMAL HIGH (ref 0.44–1.00)
GFR, Estimated: 17 mL/min — ABNORMAL LOW (ref >60)
Glucose, Bld: 142 mg/dL — ABNORMAL HIGH (ref 70–99)
Potassium: 4.5 mmol/L (ref 3.5–5.1)
Sodium: 140 mmol/L (ref 135–145)
Total Bilirubin: 1.4 mg/dL — ABNORMAL HIGH (ref 0.3–1.2)
Total Protein: 6 g/dL — ABNORMAL LOW (ref 6.5–8.1)

## 2020-11-01 LAB — CBC WITH DIFFERENTIAL/PLATELET
Abs Immature Granulocytes: 0.06 10*3/uL (ref 0.00–0.07)
Basophils Absolute: 0 10*3/uL (ref 0.0–0.1)
Basophils Relative: 0 %
Eosinophils Absolute: 0 10*3/uL (ref 0.0–0.5)
Eosinophils Relative: 0 %
HCT: 37.3 % (ref 36.0–46.0)
Hemoglobin: 12 g/dL (ref 12.0–15.0)
Immature Granulocytes: 1 %
Lymphocytes Relative: 4 %
Lymphs Abs: 0.3 10*3/uL — ABNORMAL LOW (ref 0.7–4.0)
MCH: 28.1 pg (ref 26.0–34.0)
MCHC: 32.2 g/dL (ref 30.0–36.0)
MCV: 87.4 fL (ref 80.0–100.0)
Monocytes Absolute: 0.3 10*3/uL (ref 0.1–1.0)
Monocytes Relative: 4 %
Neutro Abs: 7 10*3/uL (ref 1.7–7.7)
Neutrophils Relative %: 91 %
Platelets: 137 10*3/uL — ABNORMAL LOW (ref 150–400)
RBC: 4.27 MIL/uL (ref 3.87–5.11)
RDW: 14.7 % (ref 11.5–15.5)
WBC: 7.7 10*3/uL (ref 4.0–10.5)
nRBC: 0.3 % — ABNORMAL HIGH (ref 0.0–0.2)

## 2020-11-01 LAB — C-REACTIVE PROTEIN: CRP: 1.1 mg/dL — ABNORMAL HIGH (ref <1.0)

## 2020-11-01 LAB — GLUCOSE, CAPILLARY
Glucose-Capillary: 130 mg/dL — ABNORMAL HIGH (ref 70–99)
Glucose-Capillary: 126 mg/dL — ABNORMAL HIGH (ref 70–99)
Glucose-Capillary: 87 mg/dL (ref 70–99)

## 2020-11-01 LAB — D-DIMER, QUANTITATIVE: D-Dimer, Quant: 0.65 ug/mL-FEU — ABNORMAL HIGH (ref 0.00–0.50)

## 2020-11-01 NOTE — Progress Notes (Signed)
Occupational Therapy Evaluation  Patient lives in mobile home, son lives with her. At baseline patient is I with self care and functional transfers. Currently patient is close to baseline with mobility, min G for safety and primarily limitation is cardiopulmonary status impacting pt endurance. Pt desat to 87% on 15L HFNC with transfer to recliner, min cues for PLB and ~30 second seated recover to return to low 90s. Patient will benefit from skilled OT services to improve activity tolerance and cardiopulmonary status  o reduce oxygenation needs in order for patient to return home at discharge.    11/01/20 1400  OT Visit Information  Last OT Received On 11/01/20  Assistance Needed +1  History of Present Illness 70 year old female with past medical history significant for COPD/chronic hypoxic respiratory failure on 2 L nasal cannula at baseline, interstitial lung disease, type 2 diabetes mellitus, chronic kidney disease stage IV, chronic diastolic congestive heart failure who presented to the ED with progressive shortness of breath. Covid-19 + with PNA, CXR demonstrates cardiomegaly, vascular congestion and small bilateral pleural effusions with right lung base atelectasis versus developing infiltrate.  Precautions  Precautions Fall  Restrictions  Weight Bearing Restrictions No  Home Living  Family/patient expects to be discharged to: Private residence  [place];Other (Comment) (son)  Available Help at Discharge Family  Type of [place] to enter  Entrance Stairs-Number of Steps 4-5  Entrance Stairs-Rails Right;Left;Can reach both  Home Layout One level  Bathroom Shower/Tub Tub/shower unit  [place] - single point;Walker - 4 wheels;Wheelchair - manual  Prior Function  Level of Independence Independent  Comments pt reports complete independence with mobility and ADLs, still drives  Communication   Communication No difficulties  Pain Assessment  Pain Assessment No/denies pain  Cognition  Arousal/Alertness Awake/alert  Behavior During Therapy WFL for tasks assessed/performed  Overall Cognitive Status Within Functional Limits for tasks assessed  Upper Extremity Assessment  Upper Extremity Assessment Overall WFL for tasks assessed  Lower Extremity Assessment  Lower Extremity Assessment Defer to PT evaluation  Cervical / Trunk Assessment  Cervical / Trunk Assessment Normal  ADL  Overall ADL's  Needs assistance/impaired  Eating/Feeding Independent;Sitting  Grooming Set up;Sitting  Upper Body Bathing Set up;Sitting  Lower Body Bathing Min guard;Sitting/lateral leans;Sit to/from stand  Upper Body Dressing  Set up;Sitting  Lower Body Dressing Min guard;Sitting/lateral leans;Sit to/from stand  Lower Body Dressing Details (indicate cue type and reason) for safety in standing  Toilet Transfer Min guard;Ambulation  Toilet Transfer Details (indicate cue type and reason) patient ambulate few feet from w/c to recliner after being transferred to ICU. pt on 15L HFNC and desat to 87% with transfer requiring min cues for PLB and ~30 seconds to recover to low 90s.  Toileting- Music therapist guard;Sit to/from stand  Functional mobility during ADLs Min guard  General ADL Comments patient appears close to baseline with self care, primarily limitation of cardiopulmonary status limiting pt endurance. min cues for PLB with activity  Bed Mobility  General bed mobility comments OOB  Transfers  Overall transfer level Needs assistance  Equipment used None  Transfers Sit to/from Stand  Sit to Stand Min guard  General transfer comment min G for safety, able to power up to standing from w/c without physical assistance  Balance  Overall balance assessment Needs assistance  Sitting-balance support Feet supported  Sitting balance-Leahy Scale Good  Standing balance support No upper  extremity  supported  Standing balance-Leahy Scale Fair  OT - End of Session  Equipment Utilized During Treatment Oxygen  Activity Tolerance Patient tolerated treatment well  Patient left in chair;with call bell/phone within reach  Nurse Communication Mobility status  OT Assessment  OT Recommendation/Assessment Patient needs continued OT Services  OT Visit Diagnosis Other (comment) (COVID PNA)  OT Problem List Cardiopulmonary status limiting activity;Decreased activity tolerance  OT Plan  OT Frequency (ACUTE ONLY) Min 2X/week  OT Treatment/Interventions (ACUTE ONLY) Self-care/ADL training;Therapeutic exercise;Energy conservation;DME and/or AE instruction;Therapeutic activities;Patient/family education;Balance training  AM-PAC OT "6 Clicks" Daily Activity Outcome Measure (Version 2)  Help from another person eating meals? 4  Help from another person taking care of personal grooming? 3  Help from another person toileting, which includes using toliet, bedpan, or urinal? 3  Help from another person bathing (including washing, rinsing, drying)? 3  Help from another person to put on and taking off regular upper body clothing? 3  Help from another person to put on and taking off regular lower body clothing? 3  6 Click Score 19  OT Recommendation  Follow Up Recommendations Home health OT;Supervision/Assistance - 24 hour (at least initially)  OT Equipment Tub/shower seat  Individuals Consulted  Consulted and Agree with Results and Recommendations Patient  Acute Rehab OT Goals  Patient Stated Goal get better and go home  OT Goal Formulation With patient  Time For Goal Achievement 11/15/20  Potential to Achieve Goals Good  OT Time Calculation  OT Start Time (ACUTE ONLY) 1135  OT Stop Time (ACUTE ONLY) 1147  OT Time Calculation (min) 12 min  OT General Charges  $OT Visit 1 Visit  OT Evaluation  $OT Eval Low Complexity 1 Low  Written Expression  Dominant Hand Right   Delbert Phenix  OT OT pager: [phone]

## 2020-11-01 NOTE — Evaluation (Signed)
Physical Therapy Evaluation Patient Details Name: Erica [name] MRN: [id number] DOB: 22-Apr-1951 Today's Date: 11/01/2020   History of Present Illness  70 year old female with past medical history significant for COPD/chronic hypoxic respiratory failure on 2 L nasal cannula at baseline, interstitial lung disease, type 2 diabetes mellitus, chronic kidney disease stage IV, chronic diastolic congestive heart failure who presented to the ED with progressive shortness of breath. Covid-19 + with PNA, CXR demonstrates cardiomegaly, vascular congestion and small bilateral pleural effusions with right lung base atelectasis versus developing infiltrate.  Clinical Impression   Pt presents with generalized weakness, dyspnea on exertion with RRmax 30s, O2 desaturation with mobility, decreased knowledge and application of proper breathing technique, mild difficulty performing mobility tasks, and decreased activity tolerance. Pt to benefit from acute PT to address deficits. Pt ambulated short room distance to reach recliner this day, requiring min assist overall for mobility. SPO2 82% on 12LO2, increased O2 to 15LO2 and encouraged pursed lip breathing technique for recovery of O2 to 90%. HRmax 120 bpm. PT expects pt to progress well, pending respiratory status. PT recommending HHPT and supervision from pt's son. PT to progress mobility as tolerated, and will continue to follow acutely.      Follow Up Recommendations Home health PT;Supervision for mobility/OOB    Equipment Recommendations  None recommended by PT    Recommendations for Other Services       Precautions / Restrictions Precautions Precautions: Fall Restrictions Weight Bearing Restrictions: No      Mobility  Bed Mobility Overal bed mobility: Needs Assistance Bed Mobility: Supine to Sit     Supine to sit: Min assist;HOB elevated     General bed mobility comments: Min assist for completion of trunk elevation off bed, verbal cuing for  sequencing task and scooting to EOB.    Transfers Overall transfer level: Needs assistance Equipment used: 1 person hand held assist Transfers: Sit to/from Stand Sit to Stand: Min assist         General transfer comment: Min assist for initial rise and steady  Ambulation/Gait Ambulation/Gait assistance: Min assist Gait Distance (Feet): 3 Feet Assistive device: 1 person hand held assist Gait Pattern/deviations: Step-to pattern;Decreased step length - right;Decreased step length - left;Trunk flexed Gait velocity: decr   General Gait Details: Light min assist to steady, pt utilizing PT hand and bedrails to steady self and ambulate to recliner. Verbal cuing for sequencing task to reach chair. Further mobility not attempted as SPO2 82% on 12LO2, increased O2 to 15LO2 and encouraged pursed lip breathing technique for recovery of O2 to 90%.  Stairs            Wheelchair Mobility    Modified Rankin (Stroke Patients Only)       Balance Overall balance assessment: Needs assistance Sitting-balance support: No upper extremity supported;Feet unsupported Sitting balance-Leahy Scale: Good     Standing balance support: No upper extremity supported Standing balance-Leahy Scale: Fair Standing balance comment: able to stand statically without UE support                             Pertinent Vitals/Pain Pain Assessment: No/denies pain    Home Living Family/patient expects to be discharged to:: Private residence Living Arrangements: Children (son) Available Help at Discharge: Family Type of Home: Mobile home Home Access: Stairs to enter Entrance Stairs-Rails: Right;Left;Can reach both Entrance Stairs-Number of Steps: 4-5 Home Layout: One level Home Equipment: Cane - single point;Walker - 4  wheels;Wheelchair - manual      Prior Function Level of Independence: Independent         Comments: pt reports complete independence with mobility and ADLs, still drives      Hand Dominance   Dominant Hand: Right    Extremity/Trunk Assessment   Upper Extremity Assessment Upper Extremity Assessment: Defer to OT evaluation    Lower Extremity Assessment Lower Extremity Assessment: Generalized weakness    Cervical / Trunk Assessment Cervical / Trunk Assessment: Normal  Communication   Communication: No difficulties  Cognition Arousal/Alertness: Awake/alert Behavior During Therapy: WFL for tasks assessed/performed Overall Cognitive Status: Within Functional Limits for tasks assessed                                        General Comments General comments (skin integrity, edema, etc.): 12-15LO2 during sesssion, SpO6mn 82% on 12LO2 which recovered to 90% on 15LO2 and with breathing technique    Exercises General Exercises - Lower Extremity Long Arc Quad: AROM;Both;5 reps;Seated   Assessment/Plan    PT Assessment Patient needs continued PT services  PT Problem List Decreased strength;Decreased mobility;Decreased activity tolerance;Decreased balance;Decreased knowledge of use of DME;Cardiopulmonary status limiting activity;Decreased knowledge of precautions;Decreased safety awareness       PT Treatment Interventions DME instruction;Therapeutic activities;Gait training;Therapeutic exercise;Patient/family education;Balance training;Stair training;Functional mobility training;Neuromuscular re-education    PT Goals (Current goals can be found in the Care Plan section)  Acute Rehab PT Goals Patient Stated Goal: get better and go home PT Goal Formulation: With patient Time For Goal Achievement: 11/15/20 Potential to Achieve Goals: Good    Frequency Min 3X/week   Barriers to discharge        Co-evaluation               AM-PAC PT "6 Clicks" Mobility  Outcome Measure Help needed turning from your back to your side while in a flat bed without using bedrails?: A Little Help needed moving from lying on your back to  sitting on the side of a flat bed without using bedrails?: A Little Help needed moving to and from a bed to a chair (including a wheelchair)?: A Little Help needed standing up from a chair using your arms (e.g., wheelchair or bedside chair)?: A Little Help needed to walk in hospital room?: A Little Help needed climbing 3-5 steps with a railing? : A Little 6 Click Score: 18    End of Session Equipment Utilized During Treatment: Oxygen Activity Tolerance: Patient tolerated treatment well;Patient limited by fatigue Patient left: in chair;with call bell/phone within reach;with chair alarm set;with nursing/sitter in room Nurse Communication: Mobility status;Other (comment) (pt requesting something to help unclog her nose (saline or nose spray)) PT Visit Diagnosis: Muscle weakness (generalized) (M62.81);Unsteadiness on feet (R26.81)    Time: [id number] Time Calculation (min) (ACUTE ONLY): 33 min   Charges:   PT Evaluation $PT Eval Low Complexity: 1 Low PT Treatments $Therapeutic Activity: 8-22 mins       Oria Klimas S, PT Acute Rehabilitation Services Pager [phone] Office [phone] [place] 11/01/2020, 11:08 AM

## 2020-11-01 NOTE — Progress Notes (Signed)
PROGRESS NOTE    Erica [name]  [id number] DOB: 1951/01/12 DOA: 10/27/2020 PCP: [facility]    Brief Narrative:  Erica [name] is a 70 year old female with past medical history significant for COPD/chronic hypoxic respiratory failure on 2 L nasal cannula at baseline, interstitial lung disease, type 2 diabetes mellitus, chronic kidney disease stage IV, chronic diastolic congestive heart failure who presented to the ED with progressive shortness of breath. Patient also with associated dizziness, cough, increased work of breathing. Patient denies any productive cough/phlegm, no fever/chills, no recent weight gain or lower extremity edema. Patient reports vaccinated against Cova-19, but has not yet received a booster dose.  In the ED, temperature 98.4, HR 93, RR 12, BP 114/61, SPO2 77% on 4 L nasal cannula. Patient was placed on high flow nasal cannula 15 L/min with SPO2 88%. ABG with pH 7.45/PaCO2 46.6/PaO2 57.2. Sodium 137, potassium 3.8, chloride 93, glucose 129, BUN 82, creatinine 3.30. BNP 2370. WBC 2.5, hemoglobin 11.8, platelets 117. SARS-CoV-2 positive. Chest x-ray with cardiomegaly, vascular congestion and small bilateral pleural effusions with right lung base atelectasis versus developing infiltrate. Hospitalist service consulted for further evaluation and management of acute on chronic hypoxic respite failure secondary to Covid-19 viral pneumonia, COPD exacerbation, diastolic congestive heart failure exacerbation and community-acquired pneumonia; in addition to acute on chronic renal failure.   Assessment & Plan:   Principal Problem:   Acute on chronic respiratory failure (HCC) Active Problems:   COPD exacerbation (HCC)   Hypertension   Diabetes mellitus type 2, controlled ([place])   (HFpEF) heart failure with preserved ejection fraction (HCC)   Acute renal failure superimposed on stage 4 chronic kidney disease (HCC)   COPD with acute exacerbation  (HCC)   Hyperkalemia   Acute diastolic (congestive) heart failure (HCC)   Anemia due to stage 4 chronic kidney disease (HCC)   Cigarette smoker   Pneumonia due to COVID-19 virus   Acute on chronic hypoxic respiratory failure secondary to acute Covid-19 viral pneumonia during the ongoing Covid 19 Pandemic - POA Patient presenting to the ED with progressive shortness of breath. SARS-CoV-2 positive. Elevated CRP 3.4 with D-dimer 1.04. Patient is vaccinated against Covid-19 but has not received booster dose. --COVID test: + 11/16/2020 --CRP 3.4>2.1>1.4>1.1 --ddimer 1.04>1.23>1.12>0.65 --Remdesivir, plan 5-day course (Day #5/5) --Solumedrol '80mg'$  IV q12h --Avoiding baricitinib/Actemra given superimposed bacterial pneumonia --prone for 2-3hrs every 12hrs if able --Continue supplemental oxygen, titrate to maintain SPO2 greater than 88%, on 12LHFNC (baseline 4LNC) --Continue supportive care with albuterol MDI prn, vitamin C, zinc, Tylenol, antitussives (benzonatate/ Mucinex/Tussionex) --Follow CBC, CMP, D-dimer, and CRP daily --Continue airborne/contact isolation precautions   The treatment plan and use of medications and known side effects were discussed with patient/family. Some of the medications used are based on case reports/anecdotal data.  All other medications being used in the management of COVID-19 based on limited study data.  Complete risks and long-term side effects are unknown, however in the best clinical judgment they seem to be of some benefit.  Patient wanted to proceed with treatment options provided.  Community acquired pneumonia, POA Chest x-ray with right lung base infiltrates. Nuclear medicine VQ scan negative for pulmonary embolism. --Azithromycin 500 mg PO q24h, plan 5 day course --Ceftriaxone 1 g IV every 24 hours --Continue supplemental oxygen as above  Acute on chronic respiratory failure, POA COPD exacerbation Interstitial lung disease --Continue azithromycin as  above --Continue Solu-Medrol as above --Combivent 1 puff inh q6h --Continue submental oxygen, maintain SPO2  greater than 88%, on 12 L high flow nasal cannula (baseline 4 L [place])  Acute renal failure on CKD stage IV Creatinine baseline 2.3 - 2.45. Creatinine elevated at 3.28 on admission. Etiology likely secondary to volume overload with elevated BNP and vascular congestion noted on chest x-ray. --Cr 3.28>2.70>2.39>2.89 --Hold home lisinopril --Furosemide 40 mg IV every 12 hours --Avoid nephrotoxins, renally dose all medications --Strict I's and O's and daily weights  Lactic acidosis, POA Elevated lactic acidosis on admission, likely secondary to Covid-19 viral pneumonia complicated by community-acquired pneumonia as above. --repeat lactic acid in the am.  Type 2 diabetes mellitus Hemoglobin A1c 5.3, well-controlled. Diet controlled at home.  Essential hypertension On amlodipine 10 mg p.o. daily, lisinopril 2.5 mg p.o. daily at home. --holding antihypertensives for borderline hypotension  GERD: Continue PPI  Iron deficiency anemia: Continue ferrous sulfate '325mg'$  PO TID  DVT prophylaxis: Heparin   Code Status: Full Code Family Communication: Updated patient extensively at bedside  Disposition Plan:  Level of care: Progressive, stable to transfer to progressive care Status is: Inpatient  Remains inpatient appropriate because:Ongoing diagnostic testing needed not appropriate for outpatient work up, Unsafe d/c plan, IV treatments appropriate due to intensity of illness or inability to take PO and Inpatient level of care appropriate due to severity of illness   Dispo: The patient is from: Home              Anticipated d/c is to: Home              Anticipated d/c date is: > 3 days              Patient currently is not medically stable to d/c.   Difficult to place patient No    Consultants:   None  Procedures:   Nuclear medicine VQ scan  Antimicrobials:   Azithromycin  2/3>>  Ceftriaxone 2/3>>  Cefepime 2/3 - 2/3  Vancomycin 2/3 - 2/3  Metronidazole 2/3 - 2/3   Subjective: Patient seen and examined bedside, resting comfortably.  No specific complaints, oxygen turned up overnight from 8 L to 12 L high flow nasal cannula.  Overall she states her breathing is improved. No other questions or concerns at this time. Denies headache, no fever/chills/night sweats, no nausea/vomiting/diarrhea, no chest pain, no palpitations, no abdominal pain, no weakness, no fatigue, no paresthesias. No acute events overnight per nursing staff.  Pending transfer to progressive care bed.  Objective: Vitals:   11/01/20 0636 11/01/20 0748 11/01/20 0800 11/01/20 0840  BP: (!) 101/46  (!) 106/58   Pulse: 100  (!) 105 97  Resp: 12  20 (!) 22  Temp:  97.7 F (36.5 C)    TempSrc:  Oral    SpO2: 97%  (!) 88% 98%  Weight:      Height:        Intake/Output Summary (Last 24 hours) at 11/01/2020 1037 Last data filed at 11/01/2020 1020 Gross per 24 hour  Intake 915.63 ml  Output 1000 ml  Net -84.37 ml   Filed Weights   11/15/2020 1402 10/29/20 0200  Weight: 54.9 kg 55.1 kg    Examination:  General exam: Appears calm and comfortable, chronically ill in appearance Respiratory system: Decreased breath sounds bilateral bases with mild crackles, normal respiratory effort, no accessory muscle use, on 12 L high flow nasal cannula (baseline 4LNC) Cardiovascular system: S1 & S2 heard, RRR. No JVD, murmurs, rubs, gallops or clicks. No pedal edema. Gastrointestinal system: Abdomen is nondistended, soft and  nontender. No organomegaly or masses felt. Normal bowel sounds heard. Central nervous system: Alert and oriented. No focal neurological deficits. Extremities: Symmetric 5 x 5 power. Skin: No rashes, lesions or ulcers Psychiatry: Judgement and insight appear poor. Mood & affect appropriate.     Data Reviewed: I have personally reviewed following labs and imaging  studies  CBC: Recent Labs  Lab 11/18/2020 1500 11/11/2020 1504 10/29/20 0251 10/30/20 0249 10/31/20 0236 11/01/20 0229  WBC 2.5*  --  1.1* 4.0 7.9 7.7  NEUTROABS 2.0  --  0.8* 3.3 7.4 7.0  HGB 11.8* 12.6 11.3* 11.4* 12.0 12.0  HCT 37.3 37.0 34.5* 34.7* 36.6 37.3  MCV 89.9  --  87.3 87.4 87.6 87.4  PLT 117*  --  109* 133* 152 0000000*   Basic Metabolic Panel: Recent Labs  Lab 11/14/2020 1644 10/29/20 0251 10/30/20 0249 10/31/20 0236 11/01/20 0229  NA 139 141 138 141 140  K 3.7 4.1 4.5 4.1 4.5  CL 93* 96* 95* 96* 93*  CO2 '31 28 30 30 29  '$ GLUCOSE 151* 180* 130* 121* 142*  BUN 84* 81* 84* 88* 99*  CREATININE 3.28* 2.99* 2.70* 2.39* 2.89*  CALCIUM 8.6* 8.1* 7.8* 7.7* 7.9*   GFR: Estimated Creatinine Clearance: 15.4 mL/min (A) (by C-G formula based on SCr of 2.89 mg/dL (H)). Liver Function Tests: Recent Labs  Lab 11/06/2020 1644 10/29/20 0251 10/30/20 0249 10/31/20 0236 11/01/20 0229  AST 33 35  35 '26 29 27  '$ ALT '20 21  22 20 25 28  '$ ALKPHOS 94 109  110 93 100 92  BILITOT 1.2 0.9  0.9 0.9 0.9 1.4*  PROT 6.9 6.3*  6.3* 6.2* 6.2* 6.0*  ALBUMIN 3.7 3.3*  3.3* 3.3* 3.4* 3.4*   Recent Labs  Lab 11/18/2020 1500  LIPASE 26   No results for input(s): AMMONIA in the last 168 hours. Coagulation Profile: Recent Labs  Lab 11/08/2020 1644  INR 1.1   Cardiac Enzymes: No results for input(s): CKTOTAL, CKMB, CKMBINDEX, TROPONINI in the last 168 hours. BNP (last 3 results) No results for input(s): PROBNP in the last 8760 hours. HbA1C: No results for input(s): HGBA1C in the last 72 hours. CBG: Recent Labs  Lab 10/31/20 1202 10/31/20 1609 10/31/20 1947 10/31/20 2105 11/01/20 0720  GLUCAP 197* 132* 140* 131* 130*   Lipid Profile: No results for input(s): CHOL, HDL, LDLCALC, TRIG, CHOLHDL, LDLDIRECT in the last 72 hours. Thyroid Function Tests: No results for input(s): TSH, T4TOTAL, FREET4, T3FREE, THYROIDAB in the last 72 hours. Anemia Panel: No results for input(s):  VITAMINB12, FOLATE, FERRITIN, TIBC, IRON, RETICCTPCT in the last 72 hours. Sepsis Labs: Recent Labs  Lab 11/03/2020 1500 11/21/2020 1819 10/29/20 0251 10/30/20 0249  PROCALCITON  --  0.17 0.18 0.17  LATICACIDVEN 3.3*  --   --   --     Recent Results (from the past 240 hour(s))  SARS Coronavirus 2 by RT PCR (hospital order, performed in [place] hospital lab) Nasopharyngeal Nasopharyngeal Swab     Status: Abnormal   Collection Time: 10/27/2020  2:24 PM   Specimen: Nasopharyngeal Swab  Result Value Ref Range Status   SARS Coronavirus 2 POSITIVE (A) NEGATIVE Final    Comment: RESULT CALLED TO, READ BACK BY AND VERIFIED WITH: BELTON,C ON 11/07/2020 AT 1650 BY LOY,C (NOTE) SARS-CoV-2 target nucleic acids are DETECTED  SARS-CoV-2 RNA is generally detectable in upper respiratory specimens  during the acute phase of infection.  Positive results are indicative  of the presence of the  identified virus, but do not rule out bacterial infection or co-infection with other pathogens not detected by the test.  Clinical correlation with patient history and  other diagnostic information is necessary to determine patient infection status.  The expected result is negative.  Fact Sheet for Patients:   StrictlyIdeas.no   Fact Sheet for Healthcare Providers:   BankingDealers.co.za    This test is not yet approved or cleared by the [place] FDA and  has been authorized for detection and/or diagnosis of SARS-CoV-2 by FDA under an Emergency Use Authorization (EUA).  This EUA will remain in effect (meaning this t est can be used) for the duration of  the COVID-19 declaration under Section 564(b)(1) of the Act, 21 U.S.C. section 360-bbb-3(b)(1), unless the authorization is terminated or revoked sooner.  Performed at [facility], [address] [place], [place] [address]   Blood Culture (routine x 2)     Status: None (Preliminary result)   Collection  Time: 11/13/2020  4:44 PM   Specimen: BLOOD LEFT ARM  Result Value Ref Range Status   Specimen Description BLOOD LEFT ARM  Final   Special Requests   Final    BOTTLES DRAWN AEROBIC AND ANAEROBIC Blood Culture adequate volume   Culture   Final    NO GROWTH 4 DAYS Performed at [facility], [address] [place], [place] [address]    Report Status PENDING  Incomplete  Blood Culture (routine x 2)     Status: None (Preliminary result)   Collection Time: 11/22/2020  4:44 PM   Specimen: BLOOD RIGHT FOREARM  Result Value Ref Range Status   Specimen Description BLOOD RIGHT FOREARM  Final   Special Requests   Final    BOTTLES DRAWN AEROBIC AND ANAEROBIC Blood Culture adequate volume   Culture   Final    NO GROWTH 4 DAYS Performed at [facility], [address] [place], [place] [address]    Report Status PENDING  Incomplete  MRSA PCR Screening     Status: None   Collection Time: 10/29/20 12:53 AM   Specimen: Nasopharyngeal  Result Value Ref Range Status   MRSA by PCR NEGATIVE NEGATIVE Final    Comment:        The GeneXpert MRSA Assay (FDA approved for NASAL specimens only), is one component of a comprehensive MRSA colonization surveillance program. It is not intended to diagnose MRSA infection nor to guide or monitor treatment for MRSA infections. Performed at [facility], [place] [address], [place], [place] [address]          Radiology Studies: No results found.      Scheduled Meds: . azithromycin  500 mg Oral QHS  . Chlorhexidine Gluconate Cloth  6 each Topical Daily  . ferrous sulfate  325 mg Oral TID  . furosemide  40 mg Intravenous Q12H  . heparin injection (subcutaneous)  5,000 Units Subcutaneous Q8H  . insulin aspart  0-9 Units Subcutaneous TID WC  . Ipratropium-Albuterol  1 puff Inhalation Q6H  . mouth rinse  15 mL Mouth Rinse BID  . methylPREDNISolone (SOLU-MEDROL) injection  80 mg Intravenous Q12H  . pantoprazole  40 mg Oral Daily    Continuous Infusions: . cefTRIAXone (ROCEPHIN)  IV Stopped (11/01/20 1019)  . remdesivir 100 mg in NS 100 mL Stopped (10/31/20 1035)     LOS: 4 days    Time spent: 38 minutes spent on chart review, discussion with nursing staff, consultants, updating family and interview/physical exam; more than  50% of that time was spent in counseling and/or coordination of care.    Tomio Kirk J [place] DO [facility] Available via Epic secure chat 7am-7pm After these hours, please refer to coverage provider listed on [email or web address] 11/01/2020, 10:37 AM

## 2020-11-02 DIAGNOSIS — I5030 Unspecified diastolic (congestive) heart failure: Secondary | ICD-10-CM | POA: Diagnosis not present

## 2020-11-02 DIAGNOSIS — I5031 Acute diastolic (congestive) heart failure: Secondary | ICD-10-CM | POA: Diagnosis not present

## 2020-11-02 DIAGNOSIS — J9621 Acute and chronic respiratory failure with hypoxia: Secondary | ICD-10-CM | POA: Diagnosis not present

## 2020-11-02 DIAGNOSIS — N170 Acute kidney failure with tubular necrosis: Secondary | ICD-10-CM | POA: Diagnosis not present

## 2020-11-02 LAB — CBC WITH DIFFERENTIAL/PLATELET
Abs Immature Granulocytes: 0.03 10*3/uL (ref 0.00–0.07)
Basophils Absolute: 0 10*3/uL (ref 0.0–0.1)
Basophils Relative: 0 %
Eosinophils Absolute: 0 10*3/uL (ref 0.0–0.5)
Eosinophils Relative: 0 %
HCT: 36.8 % (ref 36.0–46.0)
Hemoglobin: 12.2 g/dL (ref 12.0–15.0)
Immature Granulocytes: 1 %
Lymphocytes Relative: 5 %
Lymphs Abs: 0.3 10*3/uL — ABNORMAL LOW (ref 0.7–4.0)
MCH: 28.2 pg (ref 26.0–34.0)
MCHC: 33.2 g/dL (ref 30.0–36.0)
MCV: 85.2 fL (ref 80.0–100.0)
Monocytes Absolute: 0.2 10*3/uL (ref 0.1–1.0)
Monocytes Relative: 3 %
Neutro Abs: 5.8 10*3/uL (ref 1.7–7.7)
Neutrophils Relative %: 91 %
Platelets: 126 10*3/uL — ABNORMAL LOW (ref 150–400)
RBC: 4.32 MIL/uL (ref 3.87–5.11)
RDW: 14.6 % (ref 11.5–15.5)
WBC: 6.4 10*3/uL (ref 4.0–10.5)
nRBC: 0 % (ref 0.0–0.2)

## 2020-11-02 LAB — CULTURE, BLOOD (ROUTINE X 2)
Culture: NO GROWTH
Culture: NO GROWTH
Special Requests: ADEQUATE
Special Requests: ADEQUATE

## 2020-11-02 LAB — COMPREHENSIVE METABOLIC PANEL
ALT: 33 U/L (ref 0–44)
AST: 31 U/L (ref 15–41)
Albumin: 3.4 g/dL — ABNORMAL LOW (ref 3.5–5.0)
Alkaline Phosphatase: 90 U/L (ref 38–126)
Anion gap: 17 — ABNORMAL HIGH (ref 5–15)
BUN: 111 mg/dL — ABNORMAL HIGH (ref 8–23)
CO2: 28 mmol/L (ref 22–32)
Calcium: 7.9 mg/dL — ABNORMAL LOW (ref 8.9–10.3)
Chloride: 95 mmol/L — ABNORMAL LOW (ref 98–111)
Creatinine, Ser: 3.06 mg/dL — ABNORMAL HIGH (ref 0.44–1.00)
GFR, Estimated: 16 mL/min — ABNORMAL LOW (ref >60)
Glucose, Bld: 110 mg/dL — ABNORMAL HIGH (ref 70–99)
Potassium: 4.4 mmol/L (ref 3.5–5.1)
Sodium: 140 mmol/L (ref 135–145)
Total Bilirubin: 1.2 mg/dL (ref 0.3–1.2)
Total Protein: 6.2 g/dL — ABNORMAL LOW (ref 6.5–8.1)

## 2020-11-02 LAB — GLUCOSE, CAPILLARY
Glucose-Capillary: 126 mg/dL — ABNORMAL HIGH (ref 70–99)
Glucose-Capillary: 196 mg/dL — ABNORMAL HIGH (ref 70–99)
Glucose-Capillary: 147 mg/dL — ABNORMAL HIGH (ref 70–99)
Glucose-Capillary: 138 mg/dL — ABNORMAL HIGH (ref 70–99)
Glucose-Capillary: 172 mg/dL — ABNORMAL HIGH (ref 70–99)

## 2020-11-02 LAB — D-DIMER, QUANTITATIVE: D-Dimer, Quant: 0.43 ug/mL-FEU (ref 0.00–0.50)

## 2020-11-02 LAB — LACTIC ACID, PLASMA: Lactic Acid, Venous: 1.2 mmol/L (ref 0.5–1.9)

## 2020-11-02 LAB — C-REACTIVE PROTEIN: CRP: 0.9 mg/dL (ref <1.0)

## 2020-11-02 MED ORDER — FUROSEMIDE 40 MG PO TABS
40.0000 mg | ORAL_TABLET | Freq: Two times a day (BID) | ORAL | Status: DC
  Administered 2020-11-02 (×2): 40 mg via ORAL
  Filled 2020-11-02 (×2): qty 1

## 2020-11-02 MED ORDER — METHYLPREDNISOLONE SODIUM SUCC 40 MG IJ SOLR
40.0000 mg | Freq: Two times a day (BID) | INTRAMUSCULAR | Status: DC
  Administered 2020-11-02 – 2020-11-05 (×6): 40 mg via INTRAVENOUS
  Filled 2020-11-02 (×6): qty 1

## 2020-11-02 NOTE — Care Management Important Message (Signed)
Important Message  Patient Details IM Letter placed in Patient's door Caddy. Name: Erica [name] MRN: [id number] Date of Birth: [birth year]   Medicare Important Message Given:  Yes     Kerin Salen 11/02/2020, 10:03 AM

## 2020-11-02 NOTE — Progress Notes (Signed)
PROGRESS NOTE    Erica [name]  [id number] DOB: Feb 05, 1951 DOA: 11/20/2020 PCP: [facility]    Brief Narrative:  Erica [name] is a 70 year old female with past medical history significant for COPD/chronic hypoxic respiratory failure on 2 L nasal cannula at baseline, interstitial lung disease, type 2 diabetes mellitus, chronic kidney disease stage IV, chronic diastolic congestive heart failure who presented to the ED with progressive shortness of breath. Patient also with associated dizziness, cough, increased work of breathing. Patient denies any productive cough/phlegm, no fever/chills, no recent weight gain or lower extremity edema. Patient reports vaccinated against Cova-19, but has not yet received a booster dose.  In the ED, temperature 98.4, HR 93, RR 12, BP 114/61, SPO2 77% on 4 L nasal cannula. Patient was placed on high flow nasal cannula 15 L/min with SPO2 88%. ABG with pH 7.45/PaCO2 46.6/PaO2 57.2. Sodium 137, potassium 3.8, chloride 93, glucose 129, BUN 82, creatinine 3.30. BNP 2370. WBC 2.5, hemoglobin 11.8, platelets 117. SARS-CoV-2 positive. Chest x-ray with cardiomegaly, vascular congestion and small bilateral pleural effusions with right lung base atelectasis versus developing infiltrate. Hospitalist service consulted for further evaluation and management of acute on chronic hypoxic respite failure secondary to Covid-19 viral pneumonia, COPD exacerbation, diastolic congestive heart failure exacerbation and community-acquired pneumonia; in addition to acute on chronic renal failure.   Assessment & Plan:   Principal Problem:   Acute on chronic respiratory failure (HCC) Active Problems:   COPD exacerbation (HCC)   Hypertension   Diabetes mellitus type 2, controlled ([place])   (HFpEF) heart failure with preserved ejection fraction (HCC)   Acute renal failure superimposed on stage 4 chronic kidney disease (HCC)   COPD with acute exacerbation  (HCC)   Hyperkalemia   Acute diastolic (congestive) heart failure (HCC)   Anemia due to stage 4 chronic kidney disease (HCC)   Cigarette smoker   Pneumonia due to COVID-19 virus   Acute on chronic hypoxic respiratory failure secondary to acute Covid-19 viral pneumonia during the ongoing Covid 19 Pandemic - POA Patient presenting to the ED with progressive shortness of breath. SARS-CoV-2 positive. Elevated CRP 3.4 with D-dimer 1.04. Patient is vaccinated against Covid-19 but has not received booster dose. --COVID test: + 11/09/2020 --CRP 3.4>2.1>1.4>1.1>0.9 --ddimer 1.04>1.23>1.12>0.65>0.43 --Completed 5-day course of remdesivir on 11/01/2020 --Solumedrol '40mg'$  IV q12h --Avoiding baricitinib/Actemra given superimposed bacterial pneumonia --prone for 2-3hrs every 12hrs if able --Continue supplemental oxygen, titrate to maintain SPO2 greater than 88%, on 9LHFNC (baseline 4LNC) --Continue supportive care with albuterol MDI prn, vitamin C, zinc, Tylenol, antitussives (benzonatate/ Mucinex/Tussionex) --Follow CBC, CMP, D-dimer, and CRP daily --Continue airborne/contact isolation precautions   The treatment plan and use of medications and known side effects were discussed with patient/family. Some of the medications used are based on case reports/anecdotal data.  All other medications being used in the management of COVID-19 based on limited study data.  Complete risks and long-term side effects are unknown, however in the best clinical judgment they seem to be of some benefit.  Patient wanted to proceed with treatment options provided.  Community acquired pneumonia, POA Chest x-ray with right lung base infiltrates. Nuclear medicine VQ scan negative for pulmonary embolism. --Completed 5-day course of azithromycin --Ceftriaxone 1 g IV every 24 hours --Continue supplemental oxygen as above  Acute on chronic respiratory failure, POA COPD exacerbation Interstitial lung disease --Continue  azithromycin as above --Continue Solu-Medrol as above --Combivent 1 puff inh q6h --Continue submental oxygen, maintain SPO2 greater than 88%,  on 9L high flow nasal cannula (baseline 4 L [place])  Acute renal failure on CKD stage IV Creatinine baseline 2.3 - 2.45. Creatinine elevated at 3.28 on admission. Etiology likely secondary to volume overload with elevated BNP and vascular congestion noted on chest x-ray. --Cr 3.28>2.70>2.39>2.89>3.06 --Hold home lisinopril --Transition IV furosemide to 40 mg p.o. twice daily today --Avoid nephrotoxins, renally dose all medications --Strict I's and O's and daily weights  Lactic acidosis, POA Elevated lactic acidosis on admission, likely secondary to Covid-19 viral pneumonia complicated by community-acquired pneumonia as above.  Lactic acid now down to 1.2.  Type 2 diabetes mellitus Hemoglobin A1c 5.3, well-controlled. Diet controlled at home.  Essential hypertension On amlodipine 10 mg p.o. daily, lisinopril 2.5 mg p.o. daily at home. --holding antihypertensives for borderline hypotension  GERD: Continue PPI  Iron deficiency anemia: Continue ferrous sulfate '325mg'$  PO TID  DVT prophylaxis: Heparin   Code Status: Full Code Family Communication: Updated patient extensively at bedside  Disposition Plan:  Level of care: Progressive, stable to transfer to progressive care Status is: Inpatient  Remains inpatient appropriate because:Ongoing diagnostic testing needed not appropriate for outpatient work up, Unsafe d/c plan, IV treatments appropriate due to intensity of illness or inability to take PO and Inpatient level of care appropriate due to severity of illness   Dispo: The patient is from: Home              Anticipated d/c is to: Home              Anticipated d/c date is: 3 days              Patient currently is not medically stable to d/c.   Difficult to place patient No    Consultants:   None  Procedures:   Nuclear medicine VQ  scan  Antimicrobials:   Azithromycin 2/3 - 2/5  Ceftriaxone 2/3>>  Cefepime 2/3 - 2/3  Vancomycin 2/3 - 2/3  Metronidazole 2/3 - 2/3   Subjective: Patient seen and examined bedside, resting comfortably.  Patient states " I want to sleep, and everyone keeps waking me up".  High flow nasal cannula now down to 9 L/min.  States breathing is proved.  No other questions or concerns at this time. Denies headache, no fever/chills/night sweats, no nausea/vomiting/diarrhea, no chest pain, no palpitations, no abdominal pain, no weakness, no fatigue, no paresthesias. No acute events overnight per nursing staff.  Pending transfer to progressive care bed.  Objective: Vitals:   11/01/20 1700 11/01/20 2100 11/02/20 0527 11/02/20 0640  BP:  118/84 116/67 112/61  Pulse:  100 (!) 108 (!) 107  Resp: '20  20 20  '$ Temp:  (!) 97.5 F (36.4 C) 97.7 F (36.5 C) 98.2 F (36.8 C)  TempSrc:  Oral Oral Oral  SpO2: 96% 97% 95% (!) 89%  Weight:      Height:        Intake/Output Summary (Last 24 hours) at 11/02/2020 1059 Last data filed at 11/02/2020 [id number] Gross per 24 hour  Intake 1010 ml  Output 600 ml  Net 410 ml   Filed Weights   11/22/2020 1402 10/29/20 0200  Weight: 54.9 kg 55.1 kg    Examination:  General exam: Appears calm and comfortable, chronically ill in appearance Respiratory system: Decreased breath sounds bilateral bases with mild crackles, normal respiratory effort, no accessory muscle use, on 9L high flow nasal cannula (baseline 4LNC) Cardiovascular system: S1 & S2 heard, RRR. No JVD, murmurs, rubs, gallops or clicks. No  pedal edema. Gastrointestinal system: Abdomen is nondistended, soft and nontender. No organomegaly or masses felt. Normal bowel sounds heard. Central nervous system: Alert and oriented. No focal neurological deficits. Extremities: Symmetric 5 x 5 power. Skin: No rashes, lesions or ulcers Psychiatry: Judgement and insight appear poor. Mood & affect appropriate.      Data Reviewed: I have personally reviewed following labs and imaging studies  CBC: Recent Labs  Lab 10/29/20 0251 10/30/20 0249 10/31/20 0236 11/01/20 0229 11/02/20 0431  WBC 1.1* 4.0 7.9 7.7 6.4  NEUTROABS 0.8* 3.3 7.4 7.0 5.8  HGB 11.3* 11.4* 12.0 12.0 12.2  HCT 34.5* 34.7* 36.6 37.3 36.8  MCV 87.3 87.4 87.6 87.4 85.2  PLT 109* 133* 152 137* [id number]*   Basic Metabolic Panel: Recent Labs  Lab 10/29/20 0251 10/30/20 0249 10/31/20 0236 11/01/20 0229 11/02/20 0431  NA 141 138 141 140 140  K 4.1 4.5 4.1 4.5 4.4  CL 96* 95* 96* 93* 95*  CO2 '28 30 30 29 28  '$ GLUCOSE 180* 130* 121* 142* 110*  BUN 81* 84* 88* 99* 111*  CREATININE 2.99* 2.70* 2.39* 2.89* 3.06*  CALCIUM 8.1* 7.8* 7.7* 7.9* 7.9*   GFR: Estimated Creatinine Clearance: 14.6 mL/min (A) (by C-G formula based on SCr of 3.06 mg/dL (H)). Liver Function Tests: Recent Labs  Lab 10/29/20 0251 10/30/20 0249 10/31/20 0236 11/01/20 0229 11/02/20 0431  AST 35  35 '26 29 27 31  '$ ALT '21  22 20 25 28 '$ 33  ALKPHOS 109  110 93 100 92 90  BILITOT 0.9  0.9 0.9 0.9 1.4* 1.2  PROT 6.3*  6.3* 6.2* 6.2* 6.0* 6.2*  ALBUMIN 3.3*  3.3* 3.3* 3.4* 3.4* 3.4*   Recent Labs  Lab 11/18/2020 1500  LIPASE 26   No results for input(s): AMMONIA in the last 168 hours. Coagulation Profile: Recent Labs  Lab 11/16/2020 1644  INR 1.1   Cardiac Enzymes: No results for input(s): CKTOTAL, CKMB, CKMBINDEX, TROPONINI in the last 168 hours. BNP (last 3 results) No results for input(s): PROBNP in the last 8760 hours. HbA1C: No results for input(s): HGBA1C in the last 72 hours. CBG: Recent Labs  Lab 11/01/20 0720 11/01/20 1212 11/01/20 1733 11/01/20 2120 11/02/20 0803  GLUCAP 130* 126* 126* 87 196*   Lipid Profile: No results for input(s): CHOL, HDL, LDLCALC, TRIG, CHOLHDL, LDLDIRECT in the last 72 hours. Thyroid Function Tests: No results for input(s): TSH, T4TOTAL, FREET4, T3FREE, THYROIDAB in the last 72 hours. Anemia  Panel: No results for input(s): VITAMINB12, FOLATE, FERRITIN, TIBC, IRON, RETICCTPCT in the last 72 hours. Sepsis Labs: Recent Labs  Lab 11/15/2020 1500 11/01/2020 1819 10/29/20 0251 10/30/20 0249 11/02/20 0431  PROCALCITON  --  0.17 0.18 0.17  --   LATICACIDVEN 3.3*  --   --   --  1.2    Recent Results (from the past 240 hour(s))  SARS Coronavirus 2 by RT PCR (hospital order, performed in [facility] hospital lab) Nasopharyngeal Nasopharyngeal Swab     Status: Abnormal   Collection Time: 11/16/2020  2:24 PM   Specimen: Nasopharyngeal Swab  Result Value Ref Range Status   SARS Coronavirus 2 POSITIVE (A) NEGATIVE Final    Comment: RESULT CALLED TO, READ BACK BY AND VERIFIED WITH: BELTON,C ON 11/14/2020 AT 1650 BY LOY,C (NOTE) SARS-CoV-2 target nucleic acids are DETECTED  SARS-CoV-2 RNA is generally detectable in upper respiratory specimens  during the acute phase of infection.  Positive results are indicative  of the presence of the identified  virus, but do not rule out bacterial infection or co-infection with other pathogens not detected by the test.  Clinical correlation with patient history and  other diagnostic information is necessary to determine patient infection status.  The expected result is negative.  Fact Sheet for Patients:   StrictlyIdeas.no   Fact Sheet for Healthcare Providers:   BankingDealers.co.za    This test is not yet approved or cleared by the [place] FDA and  has been authorized for detection and/or diagnosis of SARS-CoV-2 by FDA under an Emergency Use Authorization (EUA).  This EUA will remain in effect (meaning this t est can be used) for the duration of  the COVID-19 declaration under Section 564(b)(1) of the Act, 21 U.S.C. section 360-bbb-3(b)(1), unless the authorization is terminated or revoked sooner.  Performed at [facility], [address], [place], [place] [address]   Blood Culture (routine x  2)     Status: None   Collection Time: 11/12/2020  4:44 PM   Specimen: BLOOD LEFT ARM  Result Value Ref Range Status   Specimen Description BLOOD LEFT ARM  Final   Special Requests   Final    BOTTLES DRAWN AEROBIC AND ANAEROBIC Blood Culture adequate volume   Culture   Final    NO GROWTH 5 DAYS Performed at [facility], [address] [place], [place] [address]    Report Status 11/02/2020 FINAL  Final  Blood Culture (routine x 2)     Status: None   Collection Time: 10/27/2020  4:44 PM   Specimen: BLOOD RIGHT FOREARM  Result Value Ref Range Status   Specimen Description BLOOD RIGHT FOREARM  Final   Special Requests   Final    BOTTLES DRAWN AEROBIC AND ANAEROBIC Blood Culture adequate volume   Culture   Final    NO GROWTH 5 DAYS Performed at [facility], [address] [place], [place] [address]    Report Status 11/02/2020 FINAL  Final  MRSA PCR Screening     Status: None   Collection Time: 10/29/20 12:53 AM   Specimen: Nasopharyngeal  Result Value Ref Range Status   MRSA by PCR NEGATIVE NEGATIVE Final    Comment:        The GeneXpert MRSA Assay (FDA approved for NASAL specimens only), is one component of a comprehensive MRSA colonization surveillance program. It is not intended to diagnose MRSA infection nor to guide or monitor treatment for MRSA infections. Performed at [facility], [place] [address] [place],  [address]          Radiology Studies: No results found.      Scheduled Meds: . azithromycin  500 mg Oral QHS  . Chlorhexidine Gluconate Cloth  6 each Topical Daily  . ferrous sulfate  325 mg Oral TID  . furosemide  40 mg Oral BID  . heparin injection (subcutaneous)  5,000 Units Subcutaneous Q8H  . insulin aspart  0-9 Units Subcutaneous TID WC  . Ipratropium-Albuterol  1 puff Inhalation Q6H  . mouth rinse  15 mL Mouth Rinse BID  . methylPREDNISolone (SOLU-MEDROL) injection  80 mg Intravenous Q12H  . pantoprazole  40 mg Oral  Daily   Continuous Infusions: . cefTRIAXone (ROCEPHIN)  IV Stopped (11/01/20 1019)     LOS: 5 days    Time spent: 36 minutes spent on chart review, discussion with nursing staff, consultants, updating family and interview/physical exam; more than 50% of that time was spent in counseling and/or coordination of care.  Marenda Accardi J [place] DO [facility] Available via Epic secure chat 7am-7pm After these hours, please refer to coverage provider listed on [email or web address] 11/02/2020, 10:59 AM

## 2020-11-03 ENCOUNTER — Inpatient Hospital Stay (HOSPITAL_COMMUNITY): Payer: Medicare Other

## 2020-11-03 DIAGNOSIS — R0602 Shortness of breath: Secondary | ICD-10-CM

## 2020-11-03 DIAGNOSIS — I361 Nonrheumatic tricuspid (valve) insufficiency: Secondary | ICD-10-CM | POA: Diagnosis not present

## 2020-11-03 DIAGNOSIS — I340 Nonrheumatic mitral (valve) insufficiency: Secondary | ICD-10-CM

## 2020-11-03 DIAGNOSIS — J9621 Acute and chronic respiratory failure with hypoxia: Secondary | ICD-10-CM | POA: Diagnosis not present

## 2020-11-03 LAB — CBC
HCT: 37.7 % (ref 36.0–46.0)
Hemoglobin: 12.4 g/dL (ref 12.0–15.0)
MCH: 28.3 pg (ref 26.0–34.0)
MCHC: 32.9 g/dL (ref 30.0–36.0)
MCV: 86.1 fL (ref 80.0–100.0)
Platelets: 134 10*3/uL — ABNORMAL LOW (ref 150–400)
RBC: 4.38 MIL/uL (ref 3.87–5.11)
RDW: 14.7 % (ref 11.5–15.5)
WBC: 6.5 10*3/uL (ref 4.0–10.5)
nRBC: 0.3 % — ABNORMAL HIGH (ref 0.0–0.2)

## 2020-11-03 LAB — COMPREHENSIVE METABOLIC PANEL
ALT: 32 U/L (ref 0–44)
AST: 22 U/L (ref 15–41)
Albumin: 3.5 g/dL (ref 3.5–5.0)
Alkaline Phosphatase: 84 U/L (ref 38–126)
Anion gap: 19 — ABNORMAL HIGH (ref 5–15)
BUN: 114 mg/dL — ABNORMAL HIGH (ref 8–23)
CO2: 28 mmol/L (ref 22–32)
Calcium: 7.9 mg/dL — ABNORMAL LOW (ref 8.9–10.3)
Chloride: 96 mmol/L — ABNORMAL LOW (ref 98–111)
Creatinine, Ser: 3.02 mg/dL — ABNORMAL HIGH (ref 0.44–1.00)
GFR, Estimated: 16 mL/min — ABNORMAL LOW (ref >60)
Glucose, Bld: 116 mg/dL — ABNORMAL HIGH (ref 70–99)
Potassium: 4.5 mmol/L (ref 3.5–5.1)
Sodium: 143 mmol/L (ref 135–145)
Total Bilirubin: 1.1 mg/dL (ref 0.3–1.2)
Total Protein: 6.1 g/dL — ABNORMAL LOW (ref 6.5–8.1)

## 2020-11-03 LAB — MAGNESIUM: Magnesium: 2.1 mg/dL (ref 1.7–2.4)

## 2020-11-03 LAB — ECHOCARDIOGRAM LIMITED
Area-P 1/2: 4.31 cm2
Height: 64 in
MV M vel: 4.49 m/s
MV Peak grad: 80.6 mmHg
S' Lateral: 3.4 cm
Single Plane A4C EF: 49.8 %
Weight: 1948.8 oz

## 2020-11-03 LAB — BRAIN NATRIURETIC PEPTIDE: B Natriuretic Peptide: >4500 pg/mL — ABNORMAL HIGH (ref 0.0–100.0)

## 2020-11-03 LAB — C-REACTIVE PROTEIN: CRP: 0.7 mg/dL (ref <1.0)

## 2020-11-03 LAB — D-DIMER, QUANTITATIVE: D-Dimer, Quant: 0.88 ug/mL-FEU — ABNORMAL HIGH (ref 0.00–0.50)

## 2020-11-03 LAB — GLUCOSE, CAPILLARY
Glucose-Capillary: 123 mg/dL — ABNORMAL HIGH (ref 70–99)
Glucose-Capillary: 155 mg/dL — ABNORMAL HIGH (ref 70–99)
Glucose-Capillary: 114 mg/dL — ABNORMAL HIGH (ref 70–99)
Glucose-Capillary: 104 mg/dL — ABNORMAL HIGH (ref 70–99)

## 2020-11-03 LAB — PROCALCITONIN: Procalcitonin: 0.15 ng/mL

## 2020-11-03 MED ORDER — FUROSEMIDE 10 MG/ML IJ SOLN
40.0000 mg | Freq: Two times a day (BID) | INTRAMUSCULAR | Status: DC
  Administered 2020-11-03 – 2020-11-04 (×2): 40 mg via INTRAVENOUS
  Filled 2020-11-03 (×2): qty 4

## 2020-11-03 MED ORDER — FUROSEMIDE 10 MG/ML IJ SOLN
40.0000 mg | Freq: Once | INTRAMUSCULAR | Status: AC
  Administered 2020-11-03: 40 mg via INTRAVENOUS
  Filled 2020-11-03: qty 4

## 2020-11-03 NOTE — Progress Notes (Addendum)
PROGRESS NOTE    Erica [name]  [id number] DOB: 1950/10/16 DOA: 11/09/2020 PCP: [facility]   Chief Complaint  Patient presents with  . Shortness of Breath    Brief Narrative:  Erica [name] is a 70 year old female with past medical history significant for COPD/chronic hypoxic respiratory failure on 2 L nasal cannula at baseline, interstitial lung disease, type 2 diabetes mellitus, chronic kidney disease stage IV, chronic diastolic congestive heart failure who presented to the ED with progressive shortness of breath. Patient also with associated dizziness, cough, increased work of breathing. Patient denies any productive cough/phlegm, no fever/chills, no recent weight gain or lower extremity edema. Patient reports vaccinated against Covid-19, but has not yet received a booster dose.  She was admitted for acute on chronic hypoxic respiratory failure in the setting of COVID 19 pneumonia, COPD exacerbation, heart failure, in addition to superimposed bacterial pneumonia.   Assessment & Plan:   Principal Problem:   Acute on chronic respiratory failure (HCC) Active Problems:   COPD exacerbation (HCC)   Hypertension   Diabetes mellitus type 2, controlled ([place])   (HFpEF) heart failure with preserved ejection fraction (HCC)   Acute renal failure superimposed on stage 4 chronic kidney disease (HCC)   COPD with acute exacerbation (HCC)   Hyperkalemia   Acute diastolic (congestive) heart failure (HCC)   Anemia due to stage 4 chronic kidney disease (HCC)   Cigarette smoker   Pneumonia due to COVID-19 virus  Acute on Chronic Hypoxic Respiratory Failure 2/2 COVID 19 Infection (baseline 3-4 L [place]) Patient is vaccinated against Covid-19 but has not received booster dose. --COVID test: + 11/18/2020 --requiring 10 L HFNC (baseline 3-4 L) --VQ scan without evidence of PE --CXR 2/9 with mild infiltrate at R lung base, decrease in pulm interstitial edema - difficult  to exclude small lateral pneumothorax -> following CT chest  --Completed 5-day course of remdesivir on 11/01/2020 --Solumedrol '40mg'$  IV q12h --actemra/baricitinib avoided due to ?hx diverticulitis and concern for bacterial pneumonia - procalcitonin not particularly impressive and hx diverticulitis questionable based on my discussion with her.  No hx TB/hepatitis.  Due to earlier concerns and normal CRP, I think can hold off for now. --Continue supportive care with albuterol MDI prn, vitamin C, zinc, Tylenol, antitussives (benzonatate/ Mucinex/Tussionex) --Follow CBC, CMP, D-dimer, and CRP daily --Continue airborne/contact isolation precautions  --Prone as able, IS, flutter, OOB, therapy  COVID-19 Labs  Recent Labs    11/01/20 0229 11/02/20 0431 11/03/20 0431  DDIMER 0.65* 0.43 0.88*  CRP 1.1* 0.9 0.7    Lab Results  Component Value Date   SARSCOV2NAA POSITIVE (A) 11/11/2020   [place] NEGATIVE 03/27/2020   [place] NEGATIVE 02/18/2020   [place] NEGATIVE 05/21/2019   HFpEF with Exacerbation  Pulmonary Hypertension Dependent edema on exam Takes 40 mg torsemide daily  Restart IV lasix today Follow CT chest Elevated BNP, unclear significance in CKD below, but appears overloaded  Echo pending  Acute renal failure on CKD stage IV Creatinine baseline 2.3 - 2.45.  Creatinine peaked at 3.3 Fluctuating since then Cr 3.02 today, follow with IV lasix restarted today --Hold home lisinopril --Strict I's and O's and daily weights --consider renal c/s depending on response with diuresis   Community acquired pneumonia, POA Chest x-ray with right lung base infiltrates (as above) --Completed azithro/ceftriaxone --Continue supplemental oxygen as above  Acute on chronic respiratory failure, POA COPD exacerbation Interstitial lung disease --s/p azithromycin --Continue Solu-Medrol as above --Combivent 1 puff inh q6h  Lactic acidosis, POA Resolved  Type 2 diabetes  mellitus Hemoglobin A1c 5.3, well-controlled. Diet controlled at home.  Essential hypertension On amlodipine 10 mg p.o. daily, lisinopril 2.5 mg p.o. daily at home. --holding antihypertensives for borderline hypotension  GERD: Continue PPI  Iron deficiency anemia: Continue ferrous sulfate '325mg'$  PO TID  DVT prophylaxis: heparin  Code Status: full  Family Communication: none at bedside - called sister, no answer Disposition:   Status is: Inpatient  Remains inpatient appropriate because:Inpatient level of care appropriate due to severity of illness   Dispo:  Patient From: Home  Planned Disposition: Home  Expected discharge date: 11/04/2020  Medically stable for discharge: No       Consultants:   none  Procedures: Echo pending  Antimicrobials: Anti-infectives (From admission, onward)   Start     Dose/Rate Route Frequency Ordered Stop   10/30/20 2200  azithromycin (ZITHROMAX) tablet 500 mg  Status:  Discontinued        500 mg Oral Daily at bedtime 10/30/20 1103 11/02/20 1105   10/30/20 1700  vancomycin (VANCOREADY) IVPB 500 mg/100 mL  Status:  Discontinued        500 mg 100 mL/hr over 60 Minutes Intravenous Every 48 hours 11/03/2020 1608 10/26/2020 2003   10/29/20 1600  ceFEPIme (MAXIPIME) 2 g in sodium chloride 0.9 % 100 mL IVPB  Status:  Discontinued        2 g 200 mL/hr over 30 Minutes Intravenous Every 24 hours 11/13/2020 1606 11/03/2020 2003   10/29/20 1000  remdesivir 100 mg in sodium chloride 0.9 % 100 mL IVPB  Status:  Discontinued       "Followed by" Linked Group Details   100 mg 200 mL/hr over 30 Minutes Intravenous Daily 11/04/2020 2036 11/09/2020 2048   10/29/20 1000  remdesivir 100 mg in sodium chloride 0.9 % 100 mL IVPB        100 mg 200 mL/hr over 30 Minutes Intravenous Daily 11/19/2020 1819 11/01/20 1109   10/29/20 1000  cefTRIAXone (ROCEPHIN) 1 g in sodium chloride 0.9 % 100 mL IVPB  Status:  Discontinued        1 g 200 mL/hr over 30 Minutes Intravenous Every 24  hours 11/11/2020 2003 11/03/20 1437   11/04/2020 2030  azithromycin (ZITHROMAX) 500 mg in sodium chloride 0.9 % 250 mL IVPB  Status:  Discontinued        500 mg 250 mL/hr over 60 Minutes Intravenous Every 24 hours 11/06/2020 2003 10/30/20 1103   11/02/2020 1848  remdesivir 100 mg in sodium chloride 0.9 % 100 mL IVPB       "Followed by" Linked Group Details   100 mg 200 mL/hr over 30 Minutes Intravenous  Once 10/29/2020 1819 11/18/2020 2217   11/04/2020 1830  remdesivir 200 mg in sodium chloride 0.9% 250 mL IVPB  Status:  Discontinued       "Followed by" Linked Group Details   200 mg 580 mL/hr over 30 Minutes Intravenous Once 10/29/2020 2036 11/08/2020 2048   11/20/2020 1830  remdesivir 100 mg in sodium chloride 0.9 % 100 mL IVPB       "Followed by" Linked Group Details   100 mg 200 mL/hr over 30 Minutes Intravenous  Once 11/03/2020 1819 11/09/2020 2128   11/14/2020 1630  vancomycin (VANCOREADY) IVPB 1250 mg/250 mL  Status:  Discontinued        1,250 mg 166.7 mL/hr over 90 Minutes Intravenous  Once 11/17/2020 1605 11/13/2020 2003   10/29/2020 1600  ceFEPIme (MAXIPIME)  2 g in sodium chloride 0.9 % 100 mL IVPB        2 g 200 mL/hr over 30 Minutes Intravenous  Once 11/09/2020 1557 11/09/2020 1750   11/02/2020 1600  metroNIDAZOLE (FLAGYL) IVPB 500 mg        500 mg 100 mL/hr over 60 Minutes Intravenous  Once 10/27/2020 1557 11/13/2020 2022   11/16/2020 1600  vancomycin (VANCOCIN) IVPB 1000 mg/200 mL premix  Status:  Discontinued        1,000 mg 200 mL/hr over 60 Minutes Intravenous  Once 11/20/2020 1557 11/18/2020 1605         Subjective: No complaints today Says breathing is ok  Objective: Vitals:   11/02/20 0640 11/02/20 1536 11/02/20 2053 11/03/20 0703  BP: 112/61 112/64 108/65 108/76  Pulse: (!) 107 (!) 110 (!) 109 91  Resp: '20 15 20 19  '$ Temp: 98.2 F (36.8 C) 97.8 F (36.6 C) 97.7 F (36.5 C) 98 F (36.7 C)  TempSrc: Oral Oral Oral Oral  SpO2: (!) 89% 90% (!) 89% 96%  Weight:      Height:        Intake/Output  Summary (Last 24 hours) at 11/03/2020 0852 Last data filed at 11/03/2020 0703 Gross per 24 hour  Intake 650 ml  Output 1150 ml  Net -500 ml   Filed Weights   11/14/2020 1402 10/29/20 0200  Weight: 54.9 kg 55.1 kg    Examination:  General exam: Appears calm and comfortable  Respiratory system: bibasilar crackles Cardiovascular system: S1 & S2 heard, RRR. Gastrointestinal system: Abdomen is nondistended, soft and nontender. Central nervous system: Alert and oriented. No focal neurological deficits. Extremities: minimal LE edema, but notable sacral/dependent edema on exam Skin: No rashes, lesions or ulcers Psychiatry: Judgement and insight appear normal. Mood & affect appropriate.     Data Reviewed: I have personally reviewed following labs and imaging studies  CBC: Recent Labs  Lab 10/29/20 0251 10/30/20 0249 10/31/20 0236 11/01/20 0229 11/02/20 0431 11/03/20 0431  WBC 1.1* 4.0 7.9 7.7 6.4 6.5  NEUTROABS 0.8* 3.3 7.4 7.0 5.8  --   HGB 11.3* 11.4* 12.0 12.0 12.2 12.4  HCT 34.5* 34.7* 36.6 37.3 36.8 37.7  MCV 87.3 87.4 87.6 87.4 85.2 86.1  PLT 109* 133* 152 137* 126* 134*    Basic Metabolic Panel: Recent Labs  Lab 10/30/20 0249 10/31/20 0236 11/01/20 0229 11/02/20 0431 11/03/20 0431  NA 138 141 140 140 143  K 4.5 4.1 4.5 4.4 4.5  CL 95* 96* 93* 95* 96*  CO2 '30 30 29 28 28  '$ GLUCOSE 130* 121* 142* 110* 116*  BUN 84* 88* 99* 111* 114*  CREATININE 2.70* 2.39* 2.89* 3.06* 3.02*  CALCIUM 7.8* 7.7* 7.9* 7.9* 7.9*  MG  --   --   --   --  2.1    GFR: Estimated Creatinine Clearance: 14.8 mL/min (A) (by C-G formula based on SCr of 3.02 mg/dL (H)).  Liver Function Tests: Recent Labs  Lab 10/30/20 0249 10/31/20 0236 11/01/20 0229 11/02/20 0431 11/03/20 0431  AST '26 29 27 31 22  '$ ALT '20 25 28 '$ 33 32  ALKPHOS 93 100 92 90 84  BILITOT 0.9 0.9 1.4* 1.2 1.1  PROT 6.2* 6.2* 6.0* 6.2* 6.1*  ALBUMIN 3.3* 3.4* 3.4* 3.4* 3.5    CBG: Recent Labs  Lab 11/01/20 2120  11/02/20 0803 11/02/20 1126 11/02/20 1707 11/02/20 2132  GLUCAP 87 196* 147* 138* 172*     Recent Results (from the past 240  hour(s))  SARS Coronavirus 2 by RT PCR (hospital order, performed in [facility] hospital lab) Nasopharyngeal Nasopharyngeal Swab     Status: Abnormal   Collection Time: 11/14/2020  2:24 PM   Specimen: Nasopharyngeal Swab  Result Value Ref Range Status   SARS Coronavirus 2 POSITIVE (A) NEGATIVE Final    Comment: RESULT CALLED TO, READ BACK BY AND VERIFIED WITH: BELTON,C ON 11/21/2020 AT 1650 BY LOY,C (NOTE) SARS-CoV-2 target nucleic acids are DETECTED  SARS-CoV-2 RNA is generally detectable in upper respiratory specimens  during the acute phase of infection.  Positive results are indicative  of the presence of the identified virus, but do not rule out bacterial infection or co-infection with other pathogens not detected by the test.  Clinical correlation with patient history and  other diagnostic information is necessary to determine patient infection status.  The expected result is negative.  Fact Sheet for Patients:   StrictlyIdeas.no   Fact Sheet for Healthcare Providers:   BankingDealers.co.za    This test is not yet approved or cleared by the [place] FDA and  has been authorized for detection and/or diagnosis of SARS-CoV-2 by FDA under an Emergency Use Authorization (EUA).  This EUA will remain in effect (meaning this t est can be used) for the duration of  the COVID-19 declaration under Section 564(b)(1) of the Act, 21 U.S.C. section 360-bbb-3(b)(1), unless the authorization is terminated or revoked sooner.  Performed at [facility], [address], [place], [place] [address]   Blood Culture (routine x 2)     Status: None   Collection Time: 11/01/2020  4:44 PM   Specimen: BLOOD LEFT ARM  Result Value Ref Range Status   Specimen Description BLOOD LEFT ARM  Final   Special Requests   Final     BOTTLES DRAWN AEROBIC AND ANAEROBIC Blood Culture adequate volume   Culture   Final    NO GROWTH 5 DAYS Performed at [facility], [address], [place], [place] [address]    Report Status 11/02/2020 FINAL  Final  Blood Culture (routine x 2)     Status: None   Collection Time: 11/13/2020  4:44 PM   Specimen: BLOOD RIGHT FOREARM  Result Value Ref Range Status   Specimen Description BLOOD RIGHT FOREARM  Final   Special Requests   Final    BOTTLES DRAWN AEROBIC AND ANAEROBIC Blood Culture adequate volume   Culture   Final    NO GROWTH 5 DAYS Performed at [facility], [address], [place], [place] [address]    Report Status 11/02/2020 FINAL  Final  MRSA PCR Screening     Status: None   Collection Time: 10/29/20 12:53 AM   Specimen: Nasopharyngeal  Result Value Ref Range Status   MRSA by PCR NEGATIVE NEGATIVE Final    Comment:        The GeneXpert MRSA Assay (FDA approved for NASAL specimens only), is one component of a comprehensive MRSA colonization surveillance program. It is not intended to diagnose MRSA infection nor to guide or monitor treatment for MRSA infections. Performed at [facility], [place] [address] [place], [place] [address]          Radiology Studies: DG CHEST PORT 1 VIEW  Result Date: 11/03/2020 CLINICAL DATA:  Shortness of breath and respiratory failure secondary to COVID-19 pneumonia. EXAM: PORTABLE CHEST 1 VIEW COMPARISON:  11/11/2020 FINDINGS: Stable cardiac enlargement. There remains some density in the right chest which appears primarily related to the pleura  with component of calcified pleural plaque suspected. It is difficult to exclude a component of a small lateral pneumothorax. Small bilateral pleural effusions are suspected. Probable mild infiltrate at the right lung base. Suggestion of pulmonary interstitial edema on the prior study has decreased. IMPRESSION: 1. Stable cardiac enlargement. Possible small bilateral pleural  effusions. 2. Difficult to exclude a small lateral pneumothorax on the right. Consider evaluation with CT of the chest to determine if there is a pneumothorax present. 3. Possible mild infiltrate at the right lung base. 4. Decrease in pulmonary interstitial edema. Electronically Signed   By: Aletta Edouard M.D.   On: 11/03/2020 08:25        Scheduled Meds: . Chlorhexidine Gluconate Cloth  6 each Topical Daily  . ferrous sulfate  325 mg Oral TID  . furosemide  40 mg Oral BID  . heparin injection (subcutaneous)  5,000 Units Subcutaneous Q8H  . insulin aspart  0-9 Units Subcutaneous TID WC  . Ipratropium-Albuterol  1 puff Inhalation Q6H  . mouth rinse  15 mL Mouth Rinse BID  . methylPREDNISolone (SOLU-MEDROL) injection  40 mg Intravenous Q12H  . pantoprazole  40 mg Oral Daily   Continuous Infusions: . cefTRIAXone (ROCEPHIN)  IV 1 g (11/02/20 1238)     LOS: 6 days    Time spent: over 30 min    Fayrene Helper, MD [facility]   To contact the attending provider between 7A-7P or the covering provider during after hours 7P-7A, please log into the web site [email or web address] and access using universal [place] password for that web site. If you do not have the password, please call the hospital operator.  11/03/2020, 8:52 AM

## 2020-11-03 NOTE — Progress Notes (Signed)
Physical Therapy Treatment Patient Details Name: Erica [name] MRN: [id number] DOB: 09-12-1951 Today's Date: 11/03/2020    History of Present Illness 70 year old female with past medical history significant for COPD/chronic hypoxic respiratory failure on 2 L nasal cannula at baseline, interstitial lung disease, type 2 diabetes mellitus, chronic kidney disease stage IV, chronic diastolic congestive heart failure who presented to the ED with progressive shortness of breath. Covid-19 + with PNA, CXR demonstrates cardiomegaly, vascular congestion and small bilateral pleural effusions with right lung base atelectasis versus developing infiltrate.    PT Comments    Pt stood x 2 trials with RW and took a few pivotal steps to the recliner. SaO2 93% on 9L HFNC at lowest. Reviewed pursed lip breathing and incentive spirometer technique, pt had difficulty with both despite frequent verbal cues. Activity tolerance limited by fatigue.    Follow Up Recommendations  Home health PT;Supervision for mobility/OOB     Equipment Recommendations  None recommended by PT    Recommendations for Other Services       Precautions / Restrictions Precautions Precautions: Fall Precaution Comments: monitor O2 Restrictions Weight Bearing Restrictions: No    Mobility  Bed Mobility Overal bed mobility: Needs Assistance Bed Mobility: Supine to Sit     Supine to sit: Supervision     General bed mobility comments: pt attempted to sit at edge of bed while bedrail was up, VCs for safety  Transfers Overall transfer level: Needs assistance Equipment used: Rolling walker (2 wheeled) Transfers: Sit to/from [facility] Sit to Stand: Min guard Stand pivot transfers: Min guard       General transfer comment: VCs hand placement, sit to stand x 2 trials, SaO2 93% on 9L HFNC O2, VCs for pursed lip breathing, uncontrolled descent to recliner  Ambulation/Gait Ambulation/Gait assistance: Min  guard;Min assist Gait Distance (Feet): 3 Feet Assistive device: Rolling walker (2 wheeled) Gait Pattern/deviations: Step-to pattern;Decreased step length - right;Decreased step length - left;Trunk flexed Gait velocity: decr   General Gait Details: light min A to steady, SaO2 93% on 9L HFNC O2. Pt stated she was too fatigued to attempt futher ambulation.   Stairs             Wheelchair Mobility    Modified Rankin (Stroke Patients Only)       Balance Overall balance assessment: Needs assistance Sitting-balance support: No upper extremity supported;Feet unsupported Sitting balance-Leahy Scale: Good     Standing balance support: No upper extremity supported Standing balance-Leahy Scale: Fair Standing balance comment: able to stand statically without UE support                            Cognition Arousal/Alertness: Awake/alert Behavior During Therapy: WFL for tasks assessed/performed Overall Cognitive Status: Within Functional Limits for tasks assessed                                        Exercises General Exercises - Lower Extremity Long Arc Quad: AROM;Both;Seated;10 reps Hip Flexion/Marching: AROM;Both;10 reps;Seated    General Comments        Pertinent Vitals/Pain Pain Assessment: No/denies pain    Home Living                      Prior Function            PT Goals (current goals  can now be found in the care plan section) Acute Rehab PT Goals Patient Stated Goal: get better and go home PT Goal Formulation: With patient Time For Goal Achievement: 11/15/20 Potential to Achieve Goals: Good Progress towards PT goals: Progressing toward goals    Frequency    Min 3X/week      PT Plan Current plan remains appropriate    Co-evaluation              AM-PAC PT "6 Clicks" Mobility   Outcome Measure  Help needed turning from your back to your side while in a flat bed without using bedrails?: None Help  needed moving from lying on your back to sitting on the side of a flat bed without using bedrails?: A Little Help needed moving to and from a bed to a chair (including a wheelchair)?: A Little Help needed standing up from a chair using your arms (e.g., wheelchair or bedside chair)?: A Little Help needed to walk in hospital room?: A Little Help needed climbing 3-5 steps with a railing? : A Little 6 Click Score: 19    End of Session Equipment Utilized During Treatment: Oxygen Activity Tolerance: Patient tolerated treatment well;Patient limited by fatigue Patient left: in chair;with call bell/phone within reach;with chair alarm set;with nursing/sitter in room Nurse Communication: Mobility status PT Visit Diagnosis: Muscle weakness (generalized) (M62.81);Unsteadiness on feet (R26.81)     Time: [id number] PT Time Calculation (min) (ACUTE ONLY): 21 min  Charges:  $Therapeutic Activity: 8-22 mins                     Blondell Reveal Kistler PT 11/03/2020  Acute Rehabilitation Services Pager [phone] Office [phone]

## 2020-11-03 NOTE — Progress Notes (Signed)
  Echocardiogram 2D Echocardiogram has been performed.  Michiel Cowboy 11/03/2020, 2:36 PM

## 2020-11-04 DIAGNOSIS — J9621 Acute and chronic respiratory failure with hypoxia: Secondary | ICD-10-CM | POA: Diagnosis not present

## 2020-11-04 LAB — COMPREHENSIVE METABOLIC PANEL
ALT: 30 U/L (ref 0–44)
AST: 24 U/L (ref 15–41)
Albumin: 3.3 g/dL — ABNORMAL LOW (ref 3.5–5.0)
Alkaline Phosphatase: 83 U/L (ref 38–126)
Anion gap: 23 — ABNORMAL HIGH (ref 5–15)
BUN: 122 mg/dL — ABNORMAL HIGH (ref 8–23)
CO2: 25 mmol/L (ref 22–32)
Calcium: 7.8 mg/dL — ABNORMAL LOW (ref 8.9–10.3)
Chloride: 98 mmol/L (ref 98–111)
Creatinine, Ser: 3.18 mg/dL — ABNORMAL HIGH (ref 0.44–1.00)
GFR, Estimated: 15 mL/min — ABNORMAL LOW (ref >60)
Glucose, Bld: 86 mg/dL (ref 70–99)
Potassium: 4.7 mmol/L (ref 3.5–5.1)
Sodium: 146 mmol/L — ABNORMAL HIGH (ref 135–145)
Total Bilirubin: 1.3 mg/dL — ABNORMAL HIGH (ref 0.3–1.2)
Total Protein: 5.8 g/dL — ABNORMAL LOW (ref 6.5–8.1)

## 2020-11-04 LAB — GLUCOSE, CAPILLARY
Glucose-Capillary: 98 mg/dL (ref 70–99)
Glucose-Capillary: 116 mg/dL — ABNORMAL HIGH (ref 70–99)
Glucose-Capillary: 137 mg/dL — ABNORMAL HIGH (ref 70–99)
Glucose-Capillary: 145 mg/dL — ABNORMAL HIGH (ref 70–99)
Glucose-Capillary: 153 mg/dL — ABNORMAL HIGH (ref 70–99)

## 2020-11-04 LAB — CBC WITH DIFFERENTIAL/PLATELET
Abs Immature Granulocytes: 0.04 10*3/uL (ref 0.00–0.07)
Basophils Absolute: 0 10*3/uL (ref 0.0–0.1)
Basophils Relative: 0 %
Eosinophils Absolute: 0 10*3/uL (ref 0.0–0.5)
Eosinophils Relative: 0 %
HCT: 37.9 % (ref 36.0–46.0)
Hemoglobin: 12.3 g/dL (ref 12.0–15.0)
Immature Granulocytes: 1 %
Lymphocytes Relative: 3 %
Lymphs Abs: 0.2 10*3/uL — ABNORMAL LOW (ref 0.7–4.0)
MCH: 28.1 pg (ref 26.0–34.0)
MCHC: 32.5 g/dL (ref 30.0–36.0)
MCV: 86.7 fL (ref 80.0–100.0)
Monocytes Absolute: 0.3 10*3/uL (ref 0.1–1.0)
Monocytes Relative: 6 %
Neutro Abs: 5 10*3/uL (ref 1.7–7.7)
Neutrophils Relative %: 90 %
Platelets: 113 10*3/uL — ABNORMAL LOW (ref 150–400)
RBC: 4.37 MIL/uL (ref 3.87–5.11)
RDW: 14.9 % (ref 11.5–15.5)
WBC: 5.6 10*3/uL (ref 4.0–10.5)
nRBC: 0 % (ref 0.0–0.2)

## 2020-11-04 LAB — BRAIN NATRIURETIC PEPTIDE: B Natriuretic Peptide: 3869.3 pg/mL — ABNORMAL HIGH (ref 0.0–100.0)

## 2020-11-04 LAB — MAGNESIUM: Magnesium: 2.3 mg/dL (ref 1.7–2.4)

## 2020-11-04 LAB — PROCALCITONIN: Procalcitonin: 0.23 ng/mL

## 2020-11-04 LAB — PHOSPHORUS: Phosphorus: 6.3 mg/dL — ABNORMAL HIGH (ref 2.5–4.6)

## 2020-11-04 LAB — C-REACTIVE PROTEIN: CRP: 0.9 mg/dL (ref <1.0)

## 2020-11-04 LAB — FERRITIN: Ferritin: 168 ng/mL (ref 11–307)

## 2020-11-04 LAB — D-DIMER, QUANTITATIVE: D-Dimer, Quant: 0.92 ug/mL-FEU — ABNORMAL HIGH (ref 0.00–0.50)

## 2020-11-04 NOTE — Progress Notes (Signed)
Occupational Therapy Treatment Patient Details Name: Erica [name] MRN: [id number] DOB: [birth year] Today's Date: 11/04/2020    History of present illness 70 year old female with past medical history significant for COPD/chronic hypoxic respiratory failure on 2 L nasal cannula at baseline, interstitial lung disease, type 2 diabetes mellitus, chronic kidney disease stage IV, chronic diastolic congestive heart failure who presented to the ED with progressive shortness of breath. Covid-19 + with PNA, CXR demonstrates cardiomegaly, vascular congestion and small bilateral pleural effusions with right lung base atelectasis versus developing infiltrate.   OT comments  Patient found supine in bed on 5 L [place] this afternoon. Patient reports not being out of bed today. Patient supervision to transfer to side of bed but needed significant amount of time to recover, 02 sats [facility] but patient fatigued, before standing with hand hold and transferring to recliner. At edge of bed and In recliner patient requiring significant amount of time bent over lap with elbows on knees recovering from minimal activity. Patient's o2 dropped to 86% and took several minutes to recover. Therapist encouraged patient to perform UE/LE movements while in chair to promote activity and maintain ROM and strength. Patient verbalized understanding.   Follow Up Recommendations  Home health OT;Supervision/Assistance - 24 hour    Equipment Recommendations  Tub/shower seat    Recommendations for Other Services      Precautions / Restrictions Precautions Precautions: Fall Precaution Comments: monitor O2 sats, COP on 4 L at baseline       Mobility Bed Mobility Overal bed mobility: Needs Assistance Bed Mobility: Supine to Sit     Supine to sit: Supervision     General bed mobility comments: Supervision to transfer to side of bed and increased time needed.  Transfers Overall transfer level: Needs assistance Equipment used:  None Transfers: Sit to/from [facility] Sit to Stand: Min assist Stand pivot transfers: Min assist       General transfer comment: Hand hold to stand and transfer to recliner. Patient on 5 L [place]. Dropped to 86% and took increased time to recover.    Balance Overall balance assessment: Mild deficits observed, not formally tested                                         ADL either performed or assessed with clinical judgement   ADL                                               Vision       Perception     Praxis      Cognition Arousal/Alertness: Awake/alert Behavior During Therapy: WFL for tasks assessed/performed Overall Cognitive Status: Within Functional Limits for tasks assessed                                          Exercises     Shoulder Instructions       General Comments      Pertinent Vitals/ Pain       Pain Assessment: No/denies pain  Home Living  Prior Functioning/Environment              Frequency  Min 2X/week        Progress Toward Goals  OT Goals(current goals can now be found in the care plan section)  Progress towards OT goals: Progressing toward goals  Acute Rehab OT Goals Patient Stated Goal: get better and go home OT Goal Formulation: With patient Time For Goal Achievement: 11/15/20 Potential to Achieve Goals: Good  Plan Discharge plan remains appropriate    Co-evaluation                 AM-PAC OT "6 Clicks" Daily Activity     Outcome Measure   Help from another person eating meals?: None Help from another person taking care of personal grooming?: A Little Help from another person toileting, which includes using toliet, bedpan, or urinal?: A Little Help from another person bathing (including washing, rinsing, drying)?: A Little Help from another person to put on and taking off regular  upper body clothing?: A Little Help from another person to put on and taking off regular lower body clothing?: A Little 6 Click Score: 19    End of Session Equipment Utilized During Treatment: Oxygen  OT Visit Diagnosis: Other (comment) (COVID pna)   Activity Tolerance Patient limited by fatigue   Patient Left in chair;with call bell/phone within reach;with chair alarm set   Nurse Communication Mobility status        Time: [id number] OT Time Calculation (min): 16 min  Charges: OT General Charges $OT Visit: 1 Visit OT Treatments $Therapeutic Activity: 8-22 mins  Joenathan Sakuma, OTR/L [place]  Office [phone] Pager: [place] 11/04/2020, 4:13 PM

## 2020-11-04 NOTE — Progress Notes (Addendum)
PROGRESS NOTE    Erica [name]  [id number] DOB: 08-12-1951 DOA: 11/09/2020 PCP: [facility]   Chief Complaint  Patient presents with  . Shortness of Breath    Brief Narrative:  AHRI STEGENGA is Erica [name] 70 year old female with past medical history significant for COPD/chronic hypoxic respiratory failure on 2 L nasal cannula at baseline, interstitial lung disease, type 2 diabetes mellitus, chronic kidney disease stage IV, chronic diastolic congestive heart failure who presented to the ED with progressive shortness of breath. Patient also with associated dizziness, cough, increased work of breathing. Patient denies any productive cough/phlegm, no fever/chills, no recent weight gain or lower extremity edema. Patient reports vaccinated against Covid-19, but has not yet received Kloi Brodman booster dose.  She was admitted for acute on chronic hypoxic respiratory failure in the setting of COVID 19 pneumonia, COPD exacerbation, heart failure, in addition to superimposed bacterial pneumonia.   Assessment & Plan:   Principal Problem:   Acute on chronic respiratory failure (HCC) Active Problems:   COPD exacerbation (HCC)   Hypertension   Diabetes mellitus type 2, controlled ([place])   (HFpEF) heart failure with preserved ejection fraction (HCC)   Acute renal failure superimposed on stage 4 chronic kidney disease (HCC)   COPD with acute exacerbation (HCC)   Hyperkalemia   Acute diastolic (congestive) heart failure (HCC)   Anemia due to stage 4 chronic kidney disease (HCC)   Cigarette smoker   Pneumonia due to COVID-19 virus  Acute on Chronic Hypoxic Respiratory Failure 2/2 COVID 19 Infection (baseline 3-4 L [place]) Patient is vaccinated against Covid-19 but has not received booster dose. --COVID test: + 11/08/2020 --requiring ~6 L HFNC this AM, improved (baseline 3-4 L) --VQ scan without evidence of PE --CXR 2/9 with mild infiltrate at R lung base, decrease in pulm interstitial  edema - difficult to exclude small lateral pneumothorax -> CT chest 2/9 without evidence of pneumothorax.  Calcified pleural plaques on R and pleural thickening.  Area of spiculation at the lung base in the R lower lobe (increased in appearance from 2019, but relatively stable from recent CT abdomen/pelvis).  Consider PET CT after discharge.  Chronic interstitial thickening and fibrosis similar to prior exam.  Mild ascites. --Completed 5-day course of remdesivir on 11/01/2020 --Solumedrol '40mg'$  IV q12h --actemra/baricitinib avoided due to ?hx diverticulitis and concern for bacterial pneumonia  --Continue supportive care with albuterol MDI prn, vitamin C, zinc, Tylenol, antitussives (benzonatate/ Mucinex/Tussionex) --Follow CBC, CMP, D-dimer, and CRP daily --Continue airborne/contact isolation precautions  --Prone as able, IS, flutter, OOB, therapy  COVID-19 Labs  Recent Labs    11/02/20 0431 11/03/20 0431 11/04/20 0425  DDIMER 0.43 0.88* 0.92*  FERRITIN  --   --  168  CRP 0.9 0.7 0.9    Lab Results  Component Value Date   SARSCOV2NAA POSITIVE (Adiana Smelcer) 11/03/2020   [place] NEGATIVE 03/27/2020   [place] NEGATIVE 02/18/2020   [place] NEGATIVE 05/21/2019   HFpEF with Exacerbation  Pulmonary Hypertension Continued dependent edema on exam, improved oxygenation Takes 40 mg torsemide daily at home Hold IV diuretics in setting of worsening creatinine Elevated BNP, unclear significance in CKD below, but appears overloaded - improving Echo pending  Acute renal failure on CKD stage IV  Hypernatremia Creatinine baseline 2.3 - 2.45.  Creatinine peaked at 3.3 Worsening to 3.18 in setting of IV diuresis today with mild hypernatremia.  Still some dependent edema on exam, but improving from respiratory status.   Hold IV diuretics today, follow -  consider resumption of home PO dose in next day or so. --Hold home lisinopril --Strict I's and O's and daily weights --consider renal c/s  depending on response with diuresis   Community acquired pneumonia, POA Chest x-ray with right lung base infiltrates (as above) --Completed azithro/ceftriaxone --Continue supplemental oxygen as above  Area of spiculation at the lung base in the right lower lobe adjacent to the diaphragm which measures at least 12 x 10 mm  Needs outpatient follow up  Acute on chronic respiratory failure, POA COPD exacerbation Interstitial lung disease --s/p azithromycin --Continue Solu-Medrol as above --Combivent 1 puff inh q6h  Lactic acidosis, POA Resolved  Type 2 diabetes mellitus Hemoglobin A1c 5.3, well-controlled. Diet controlled at home.  Essential hypertension On amlodipine 10 mg p.o. daily, lisinopril 2.5 mg p.o. daily at home. --holding antihypertensives for borderline hypotension  GERD: Continue PPI  Iron deficiency anemia: Continue ferrous sulfate '325mg'$  PO TID  DVT prophylaxis: heparin  Code Status: full  Family Communication: none at bedside - called sister, no answer 2/10 Disposition:   Status is: Inpatient  Remains inpatient appropriate because:Inpatient level of care appropriate due to severity of illness   Dispo:  Patient From: Home  Planned Disposition: Home  Expected discharge date: 11/05/2020  Medically stable for discharge: No       Consultants:   none  Procedures: Echo pending  Antimicrobials: Anti-infectives (From admission, onward)   Start     Dose/Rate Route Frequency Ordered Stop   10/30/20 2200  azithromycin (ZITHROMAX) tablet 500 mg  Status:  Discontinued        500 mg Oral Daily at bedtime 10/30/20 1103 11/02/20 1105   10/30/20 1700  vancomycin (VANCOREADY) IVPB 500 mg/100 mL  Status:  Discontinued        500 mg 100 mL/hr over 60 Minutes Intravenous Every 48 hours 11/21/2020 1608 11/06/2020 2003   10/29/20 1600  ceFEPIme (MAXIPIME) 2 g in sodium chloride 0.9 % 100 mL IVPB  Status:  Discontinued        2 g 200 mL/hr over 30 Minutes  Intravenous Every 24 hours 11/20/2020 1606 11/18/2020 2003   10/29/20 1000  remdesivir 100 mg in sodium chloride 0.9 % 100 mL IVPB  Status:  Discontinued       "Followed by" Linked Group Details   100 mg 200 mL/hr over 30 Minutes Intravenous Daily 11/01/2020 2036 11/17/2020 2048   10/29/20 1000  remdesivir 100 mg in sodium chloride 0.9 % 100 mL IVPB        100 mg 200 mL/hr over 30 Minutes Intravenous Daily 10/30/2020 1819 11/01/20 1109   10/29/20 1000  cefTRIAXone (ROCEPHIN) 1 g in sodium chloride 0.9 % 100 mL IVPB  Status:  Discontinued        1 g 200 mL/hr over 30 Minutes Intravenous Every 24 hours 10/30/2020 2003 11/03/20 1437   11/06/2020 2030  azithromycin (ZITHROMAX) 500 mg in sodium chloride 0.9 % 250 mL IVPB  Status:  Discontinued        500 mg 250 mL/hr over 60 Minutes Intravenous Every 24 hours 11/07/2020 2003 10/30/20 1103   11/14/2020 1848  remdesivir 100 mg in sodium chloride 0.9 % 100 mL IVPB       "Followed by" Linked Group Details   100 mg 200 mL/hr over 30 Minutes Intravenous  Once 11/16/2020 1819 11/12/2020 2217   11/09/2020 1830  remdesivir 200 mg in sodium chloride 0.9% 250 mL IVPB  Status:  Discontinued       "  Followed by" Linked Group Details   200 mg 580 mL/hr over 30 Minutes Intravenous Once 11/15/2020 2036 10/29/2020 2048   11/11/2020 1830  remdesivir 100 mg in sodium chloride 0.9 % 100 mL IVPB       "Followed by" Linked Group Details   100 mg 200 mL/hr over 30 Minutes Intravenous  Once 11/15/2020 1819 11/13/2020 2128   11/08/2020 1630  vancomycin (VANCOREADY) IVPB 1250 mg/250 mL  Status:  Discontinued        1,250 mg 166.7 mL/hr over 90 Minutes Intravenous  Once 11/19/2020 1605 11/22/2020 2003   11/21/2020 1600  ceFEPIme (MAXIPIME) 2 g in sodium chloride 0.9 % 100 mL IVPB        2 g 200 mL/hr over 30 Minutes Intravenous  Once 11/04/2020 1557 11/18/2020 1750   10/31/2020 1600  metroNIDAZOLE (FLAGYL) IVPB 500 mg        500 mg 100 mL/hr over 60 Minutes Intravenous  Once 11/21/2020 1557 11/21/2020 2022    11/21/2020 1600  vancomycin (VANCOCIN) IVPB 1000 mg/200 mL premix  Status:  Discontinued        1,000 mg 200 mL/hr over 60 Minutes Intravenous  Once 11/04/2020 1557 11/01/2020 1605         Subjective: No complaints today  Objective: Vitals:   11/03/20 1000 11/03/20 1231 11/03/20 2135 11/04/20 0503  BP:  106/74 121/77 109/67  Pulse:  92 94 92  Resp:  (!) '24 18 17  '$ Temp:   97.7 F (36.5 C) (!) 97.5 F (36.4 C)  TempSrc:   Oral Oral  SpO2:  95% 93% 91%  Weight: 55.2 kg   55 kg  Height:        Intake/Output Summary (Last 24 hours) at 11/04/2020 0907 Last data filed at 11/04/2020 0506 Gross per 24 hour  Intake 100 ml  Output 1300 ml  Net -1200 ml   Filed Weights   10/29/20 0200 11/03/20 1000 11/04/20 0503  Weight: 55.1 kg 55.2 kg 55 kg    Examination:  General: No acute distress. Cardiovascular: Heart sounds show Gunnison Chahal regular rate, and rhythm. Lungs: CTAB, no appreciated adventitious lung sounds with isolation scope Abdomen: Soft, nontender, nondistended  Neurological: Alert and oriented 3. Moves all extremities 4 . Cranial nerves II through XII grossly intact. Skin: Warm and dry. No rashes or lesions. Extremities: sacral edema  Data Reviewed: I have personally reviewed following labs and imaging studies  CBC: Recent Labs  Lab 10/30/20 0249 10/31/20 0236 11/01/20 0229 11/02/20 0431 11/03/20 0431 11/04/20 0425  WBC 4.0 7.9 7.7 6.4 6.5 5.6  NEUTROABS 3.3 7.4 7.0 5.8  --  5.0  HGB 11.4* 12.0 12.0 12.2 12.4 12.3  HCT 34.7* 36.6 37.3 36.8 37.7 37.9  MCV 87.4 87.6 87.4 85.2 86.1 86.7  PLT 133* 152 137* 126* 134* 113*    Basic Metabolic Panel: Recent Labs  Lab 10/31/20 0236 11/01/20 0229 11/02/20 0431 11/03/20 0431 11/04/20 0425  NA 141 140 140 143 146*  K 4.1 4.5 4.4 4.5 4.7  CL 96* 93* 95* 96* 98  CO2 '30 29 28 28 25  '$ GLUCOSE 121* 142* 110* 116* 86  BUN 88* 99* 111* 114* 122*  CREATININE 2.39* 2.89* 3.06* 3.02* 3.18*  CALCIUM 7.7* 7.9* 7.9* 7.9* 7.8*  MG   --   --   --  2.1 2.3  PHOS  --   --   --   --  6.3*    GFR: Estimated Creatinine Clearance: 14 mL/min (Trinidad Ingle) (by C-G  formula based on SCr of 3.18 mg/dL (H)).  Liver Function Tests: Recent Labs  Lab 10/31/20 0236 11/01/20 0229 11/02/20 0431 11/03/20 0431 11/04/20 0425  AST '29 27 31 22 24  '$ ALT 25 28 33 32 30  ALKPHOS 100 92 90 84 83  BILITOT 0.9 1.4* 1.2 1.1 1.3*  PROT 6.2* 6.0* 6.2* 6.1* 5.8*  ALBUMIN 3.4* 3.4* 3.4* 3.5 3.3*    CBG: Recent Labs  Lab 11/03/20 0907 11/03/20 1225 11/03/20 1632 11/03/20 2118 11/04/20 0803  GLUCAP 123* 155* 114* 104* 98     Recent Results (from the past 240 hour(s))  SARS Coronavirus 2 by RT PCR (hospital order, performed in [facility] hospital lab) Nasopharyngeal Nasopharyngeal Swab     Status: Abnormal   Collection Time: 10/31/2020  2:24 PM   Specimen: Nasopharyngeal Swab  Result Value Ref Range Status   SARS Coronavirus 2 POSITIVE (Melissa Tomaselli) NEGATIVE Final    Comment: RESULT CALLED TO, READ BACK BY AND VERIFIED WITH: BELTON,C ON 11/12/2020 AT 1650 BY LOY,C (NOTE) SARS-CoV-2 target nucleic acids are DETECTED  SARS-CoV-2 RNA is generally detectable in upper respiratory specimens  during the acute phase of infection.  Positive results are indicative  of the presence of the identified virus, but do not rule out bacterial infection or co-infection with other pathogens not detected by the test.  Clinical correlation with patient history and  other diagnostic information is necessary to determine patient infection status.  The expected result is negative.  Fact Sheet for Patients:   StrictlyIdeas.no   Fact Sheet for Healthcare Providers:   BankingDealers.co.za    This test is not yet approved or cleared by the [place] FDA and  has been authorized for detection and/or diagnosis of SARS-CoV-2 by FDA under an Emergency Use Authorization (EUA).  This EUA will remain in effect (meaning this t est  can be used) for the duration of  the COVID-19 declaration under Section 564(b)(1) of the Act, 21 U.S.C. section 360-bbb-3(b)(1), unless the authorization is terminated or revoked sooner.  Performed at [facility], [address], [place], [place] [address]   Blood Culture (routine x 2)     Status: None   Collection Time: 11/22/2020  4:44 PM   Specimen: BLOOD LEFT ARM  Result Value Ref Range Status   Specimen Description BLOOD LEFT ARM  Final   Special Requests   Final    BOTTLES DRAWN AEROBIC AND ANAEROBIC Blood Culture adequate volume   Culture   Final    NO GROWTH 5 DAYS Performed at [facility], [address], [place], [place] [address]    Report Status 11/02/2020 FINAL  Final  Blood Culture (routine x 2)     Status: None   Collection Time: 11/03/2020  4:44 PM   Specimen: BLOOD RIGHT FOREARM  Result Value Ref Range Status   Specimen Description BLOOD RIGHT FOREARM  Final   Special Requests   Final    BOTTLES DRAWN AEROBIC AND ANAEROBIC Blood Culture adequate volume   Culture   Final    NO GROWTH 5 DAYS Performed at [facility], [address] [place],  [address]    Report Status 11/02/2020 FINAL  Final  MRSA PCR Screening     Status: None   Collection Time: 10/29/20 12:53 AM   Specimen: Nasopharyngeal  Result Value Ref Range Status   MRSA by PCR NEGATIVE NEGATIVE Final    Comment:        The GeneXpert MRSA Assay (FDA approved for  NASAL specimens only), is one component of Marianna Cid comprehensive MRSA colonization surveillance program. It is not intended to diagnose MRSA infection nor to guide or monitor treatment for MRSA infections. Performed at [facility], [place] [address] [place] [address], [place], [place] [address]          Radiology Studies: CT CHEST WO CONTRAST  Result Date: 11/03/2020 CLINICAL DATA:  Possible right pneumothorax on recent plain film EXAM: CT CHEST WITHOUT CONTRAST TECHNIQUE: Multidetector CT imaging of the chest was performed following  the standard protocol without IV contrast. COMPARISON:  Film from earlier in the same day. FINDINGS: Cardiovascular: Somewhat limited due to lack of IV contrast. Aortic atherosclerotic changes are noted. Coronary calcifications are seen. Heart is enlarged in size. The pulmonary artery as visualized shows no acute abnormality. Mediastinum/Nodes: Thoracic inlet is within normal limits. Scattered small lymph nodes are again seen and stable likely of Jaan Fischel reactive nature. Some of these are calcified in nature likely related to prior granulomatous disease. The esophagus as visualized is within normal limits. Lungs/Pleura: Filling defect is noted at the level of the carina likely related to mucous. Somewhat spiculated soft tissue nodule is noted in the right lower lobe best seen on image number 107 of series 5 similar to that seen on prior CT of the abdomen and pelvis. It is larger than on prior CT from 2019. The lungs again demonstrate diffuse emphysematous change with fibrotic changes and honeycombing similar to that seen on prior CT of the chest. Scattered calcifications are noted likely related to prior granulomatous disease. Scarring is noted in the bases bilaterally. Significant improved aeration when compared with the most recent CT of the abdomen and pelvis is seen. Calcified pleural plaques and pleural thickening are seen on the right which accounts for the density seen on recent plain film. No pneumothorax is noted. Upper Abdomen: Visualized upper abdomen demonstrates mild ascites. Right kidney is shrunken no other focal abnormality is seen. Musculoskeletal: Degenerative changes of the thoracic spine are noted. No acute rib abnormality is seen. IMPRESSION: No evidence of pneumothorax. Calcified pleural plaques on the right and pleural thickening account for the density seen on recent chest x-ray. Area of spiculation at the lung base in the right lower lobe adjacent to the diaphragm which measures at least 12 x 10  mm and is increased in appearance when compared with the prior exam of 2019 but relatively stable from the most recent CT of the abdomen and pelvis. PET-CT may be helpful for further evaluation to assess for activity when the patient's condition improves. Chronic interstitial thickening and fibrosis similar to that seen on prior exam. No acute infiltrate is noted. Improved aeration in the bases is seen when compared with the prior exam. Mild ascites Aortic Atherosclerosis (ICD10-I70.0). Electronically Signed   By: Inez Catalina M.D.   On: 11/03/2020 15:35   DG CHEST PORT 1 VIEW  Result Date: 11/03/2020 CLINICAL DATA:  Shortness of breath and respiratory failure secondary to COVID-19 pneumonia. EXAM: PORTABLE CHEST 1 VIEW COMPARISON:  11/07/2020 FINDINGS: Stable cardiac enlargement. There remains some density in the right chest which appears primarily related to the pleura with component of calcified pleural plaque suspected. It is difficult to exclude Johnney Scarlata component of Aalaiyah Yassin small lateral pneumothorax. Small bilateral pleural effusions are suspected. Probable mild infiltrate at the right lung base. Suggestion of pulmonary interstitial edema on the prior study has decreased. IMPRESSION: 1. Stable cardiac enlargement. Possible small bilateral pleural effusions. 2. Difficult to exclude Briya Lookabaugh small lateral  pneumothorax on the right. Consider evaluation with CT of the chest to determine if there is Jaydyn Bozzo pneumothorax present. 3. Possible mild infiltrate at the right lung base. 4. Decrease in pulmonary interstitial edema. Electronically Signed   By: Aletta Edouard M.D.   On: 11/03/2020 08:25   ECHOCARDIOGRAM LIMITED  Result Date: 11/03/2020    ECHOCARDIOGRAM LIMITED REPORT   Patient Name:   ASSA KURCZEWSKI Date of Exam: 11/03/2020 Medical Rec #:  [id number]        Height:       64.0 in Accession #:    [id number]       Weight:       121.5 lb Date of Birth:  1951/08/07        BSA:          1.583 m Patient Age:    64 years         BP:            108/76 mmHg Patient Gender: F                HR:           107 bpm. Exam Location:  Inpatient Procedure: Limited Echo, Cardiac Doppler and Color Doppler Indications:    Elevated brain natriuretic peptide (BNP) level [id number]  History:        Patient has prior history of Echocardiogram examinations, most                 recent 03/28/2020. COPD; Risk Factors:Hypertension, Diabetes,                 Dyslipidemia and Current Smoker.  Sonographer:    Vickie Epley RDCS Referring Phys: [phone] Serenidy Waltz CALDWELL POWELL JR  Sonographer Comments: Covid positive. IMPRESSIONS  1. ? ascites noted on subcostoal images.  2. Left ventricular ejection fraction, by estimation, is 60 to 65%. The left ventricle has normal function. The left ventricle has no regional wall motion abnormalities. Left ventricular diastolic parameters were normal.  3. Marked cor pulmonale with flattened septum in both systole and diastole. Right ventricular systolic function is severely reduced. The right ventricular size is severely enlarged.  4. Right atrial size was severely dilated.  5. The mitral valve is normal in structure. Mild mitral valve regurgitation. No evidence of mitral stenosis.  6. Tricuspid valve regurgitation is severe.  7. The aortic valve is normal in structure. Aortic valve regurgitation is not visualized. No aortic stenosis is present.  8. The inferior vena cava is dilated in size with <50% respiratory variability, suggesting right atrial pressure of 15 mmHg. FINDINGS  Left Ventricle: Left ventricular ejection fraction, by estimation, is 60 to 65%. The left ventricle has normal function. The left ventricle has no regional wall motion abnormalities. The left ventricular internal cavity size was normal in size. There is  no left ventricular hypertrophy. Left ventricular diastolic parameters were normal. Right Ventricle: Marked cor pulmonale with flattened septum in both systole and diastole. The right ventricular size is severely enlarged.  Right vetricular wall thickness was not assessed. Right ventricular systolic function is severely reduced. Left Atrium: Left atrial size was normal in size. Right Atrium: Right atrial size was severely dilated. Pericardium: There is no evidence of pericardial effusion. Mitral Valve: The mitral valve is normal in structure. Mild mitral valve regurgitation. No evidence of mitral valve stenosis. Tricuspid Valve: The tricuspid valve is normal in structure. Tricuspid valve regurgitation is severe. No evidence of tricuspid stenosis. Aortic Valve: The aortic  valve is normal in structure. Aortic valve regurgitation is not visualized. No aortic stenosis is present. Pulmonic Valve: The pulmonic valve was normal in structure. Pulmonic valve regurgitation is mild. No evidence of pulmonic stenosis. Aorta: The aortic root is normal in size and structure. Venous: The inferior vena cava is dilated in size with less than 50% respiratory variability, suggesting right atrial pressure of 15 mmHg. IAS/Shunts: No atrial level shunt detected by color flow Doppler. Additional Comments: ? ascites noted on subcostoal images. LEFT VENTRICLE PLAX 2D LVIDd:         3.90 cm     Diastology LVIDs:         3.40 cm     LV e' medial:    4.80 cm/s LV PW:         0.60 cm     LV E/e' medial:  12.6 LV IVS:        0.60 cm     LV e' lateral:   7.20 cm/s LVOT diam:     1.80 cm     LV E/e' lateral: 8.4 LV SV:         23 LV SV Index:   15 LVOT Area:     2.54 cm  LV Volumes (MOD) LV vol d, MOD A4C: 42.8 ml LV vol s, MOD A4C: 21.5 ml LV SV MOD A4C:     42.8 ml RIGHT VENTRICLE RV S prime:     5.62 cm/s TAPSE (M-mode): 0.7 cm LEFT ATRIUM         Index      RIGHT ATRIUM           Index LA diam:    2.80 cm 1.77 cm/m RA Area:     25.80 cm                                RA Volume:   87.60 ml  55.35 ml/m  AORTIC VALVE LVOT Vmax:   70.50 cm/s LVOT Vmean:  40.200 cm/s LVOT VTI:    0.091 m MITRAL VALVE               TRICUSPID VALVE MV Area (PHT): 4.31 cm    TR Peak  grad:   51.0 mmHg MV Decel Time: 176 msec    TR Vmax:        357.00 cm/s MR Peak grad: 80.6 mmHg MR Vmax:      449.00 cm/s  SHUNTS MV E velocity: 60.40 cm/s  Systemic VTI:  0.09 m MV [place] velocity: 65.60 cm/s  Systemic Diam: 1.80 cm MV E/Lysa Livengood ratio:  0.92 [place] Rouge MD Electronically signed by Jenkins Rouge MD Signature Date/Time: 11/03/2020/3:30:32 PM    Final         Scheduled Meds: . Chlorhexidine Gluconate Cloth  6 each Topical Daily  . ferrous sulfate  325 mg Oral TID  . heparin injection (subcutaneous)  5,000 Units Subcutaneous Q8H  . insulin aspart  0-9 Units Subcutaneous TID WC  . Ipratropium-Albuterol  1 puff Inhalation Q6H  . mouth rinse  15 mL Mouth Rinse BID  . methylPREDNISolone (SOLU-MEDROL) injection  40 mg Intravenous Q12H  . pantoprazole  40 mg Oral Daily   Continuous Infusions:    LOS: 7 days    Time spent: over 30 min    Fayrene Helper, MD [facility]   To contact the attending provider between 7A-7P or the covering provider during after hours  7P-7A, please log into the web site [email or web address] and access using universal [place] password for that web site. If you do not have the password, please call the hospital operator.  11/04/2020, 9:07 AM

## 2020-11-04 NOTE — Plan of Care (Signed)
  Problem: Education: Goal: Knowledge of General Education information will improve Description: Including pain rating scale, medication(s)/side effects and non-pharmacologic comfort measures Outcome: Progressing   Problem: Health Behavior/Discharge Planning: Goal: Ability to manage health-related needs will improve Outcome: Progressing   Problem: Clinical Measurements: Goal: Will remain free from infection Outcome: Progressing Goal: Respiratory complications will improve Outcome: Progressing   Problem: Nutrition: Goal: Adequate nutrition will be maintained Outcome: Progressing

## 2020-11-05 ENCOUNTER — Inpatient Hospital Stay (HOSPITAL_COMMUNITY): Payer: Medicare Other

## 2020-11-05 ENCOUNTER — Encounter (HOSPITAL_COMMUNITY): Payer: Self-pay | Admitting: Internal Medicine

## 2020-11-05 DIAGNOSIS — J9621 Acute and chronic respiratory failure with hypoxia: Secondary | ICD-10-CM | POA: Diagnosis not present

## 2020-11-05 LAB — CBC WITH DIFFERENTIAL/PLATELET
Abs Immature Granulocytes: 0.06 10*3/uL (ref 0.00–0.07)
Basophils Absolute: 0 10*3/uL (ref 0.0–0.1)
Basophils Relative: 0 %
Eosinophils Absolute: 0 10*3/uL (ref 0.0–0.5)
Eosinophils Relative: 0 %
HCT: 38.3 % (ref 36.0–46.0)
Hemoglobin: 12.7 g/dL (ref 12.0–15.0)
Immature Granulocytes: 1 %
Lymphocytes Relative: 3 %
Lymphs Abs: 0.2 10*3/uL — ABNORMAL LOW (ref 0.7–4.0)
MCH: 28.1 pg (ref 26.0–34.0)
MCHC: 33.2 g/dL (ref 30.0–36.0)
MCV: 84.7 fL (ref 80.0–100.0)
Monocytes Absolute: 0.6 10*3/uL (ref 0.1–1.0)
Monocytes Relative: 9 %
Neutro Abs: 5.9 10*3/uL (ref 1.7–7.7)
Neutrophils Relative %: 87 %
Platelets: 119 10*3/uL — ABNORMAL LOW (ref 150–400)
RBC: 4.52 MIL/uL (ref 3.87–5.11)
RDW: 15.2 % (ref 11.5–15.5)
WBC: 6.7 10*3/uL (ref 4.0–10.5)
nRBC: 0.3 % — ABNORMAL HIGH (ref 0.0–0.2)

## 2020-11-05 LAB — PROCALCITONIN: Procalcitonin: 0.46 ng/mL

## 2020-11-05 LAB — COMPREHENSIVE METABOLIC PANEL
ALT: 32 U/L (ref 0–44)
AST: 20 U/L (ref 15–41)
Albumin: 3.4 g/dL — ABNORMAL LOW (ref 3.5–5.0)
Alkaline Phosphatase: 82 U/L (ref 38–126)
Anion gap: 18 — ABNORMAL HIGH (ref 5–15)
BUN: 126 mg/dL — ABNORMAL HIGH (ref 8–23)
CO2: 29 mmol/L (ref 22–32)
Calcium: 7.8 mg/dL — ABNORMAL LOW (ref 8.9–10.3)
Chloride: 94 mmol/L — ABNORMAL LOW (ref 98–111)
Creatinine, Ser: 3.42 mg/dL — ABNORMAL HIGH (ref 0.44–1.00)
GFR, Estimated: 14 mL/min — ABNORMAL LOW (ref >60)
Glucose, Bld: 162 mg/dL — ABNORMAL HIGH (ref 70–99)
Potassium: 5 mmol/L (ref 3.5–5.1)
Sodium: 141 mmol/L (ref 135–145)
Total Bilirubin: 1.8 mg/dL — ABNORMAL HIGH (ref 0.3–1.2)
Total Protein: 6.2 g/dL — ABNORMAL LOW (ref 6.5–8.1)

## 2020-11-05 LAB — GLUCOSE, CAPILLARY
Glucose-Capillary: 149 mg/dL — ABNORMAL HIGH (ref 70–99)
Glucose-Capillary: 182 mg/dL — ABNORMAL HIGH (ref 70–99)
Glucose-Capillary: 148 mg/dL — ABNORMAL HIGH (ref 70–99)

## 2020-11-05 LAB — PHOSPHORUS: Phosphorus: 7.3 mg/dL — ABNORMAL HIGH (ref 2.5–4.6)

## 2020-11-05 LAB — MAGNESIUM: Magnesium: 2 mg/dL (ref 1.7–2.4)

## 2020-11-05 MED ORDER — PREDNISONE 20 MG PO TABS: 20.0000 mg | ORAL_TABLET | Freq: Every day | ORAL | Status: DC

## 2020-11-05 MED ORDER — PREDNISONE 10 MG PO TABS: 10.0000 mg | ORAL_TABLET | Freq: Every day | ORAL | Status: DC

## 2020-11-05 MED ORDER — SODIUM CHLORIDE 0.9 % IV SOLN: INTRAVENOUS | Status: DC

## 2020-11-05 MED ORDER — PREDNISONE 20 MG PO TABS
40.0000 mg | ORAL_TABLET | Freq: Every day | ORAL | Status: AC
  Administered 2020-11-06 – 2020-11-07 (×2): 40 mg via ORAL
  Filled 2020-11-05 (×2): qty 2

## 2020-11-05 MED ORDER — ONDANSETRON HCL 4 MG/2ML IJ SOLN
4.0000 mg | Freq: Three times a day (TID) | INTRAMUSCULAR | Status: DC | PRN
  Administered 2020-11-05: 4 mg via INTRAVENOUS
  Filled 2020-11-05: qty 2

## 2020-11-05 MED ORDER — PREDNISONE 20 MG PO TABS
30.0000 mg | ORAL_TABLET | Freq: Every day | ORAL | Status: AC
  Administered 2020-11-08 – 2020-11-09 (×2): 30 mg via ORAL
  Filled 2020-11-05 (×2): qty 1

## 2020-11-05 MED ORDER — SODIUM CHLORIDE 0.9 % IV BOLUS
500.0000 mL | Freq: Once | INTRAVENOUS | Status: AC
  Administered 2020-11-05: 500 mL via INTRAVENOUS

## 2020-11-05 NOTE — Progress Notes (Addendum)
PROGRESS NOTE    Erica [name]  [id number] DOB: Jun 17, 1951 DOA: 11/03/2020 PCP: [facility]   Chief Complaint  Patient presents with  . Shortness of Breath    Brief Narrative:  Erica [name] is Erica [name] 70 year old female with past medical history significant for COPD/chronic hypoxic respiratory failure on 4 L nasal cannula at baseline, interstitial lung disease, type 2 diabetes mellitus, chronic kidney disease stage IV, chronic diastolic congestive heart failure who presented to the ED with progressive shortness of breath. Patient also with associated dizziness, cough, increased work of breathing. Patient denies any productive cough/phlegm, no fever/chills, no recent weight gain or lower extremity edema. Patient reports vaccinated against Covid-19, but has not yet received Erica [name] booster dose.  She was admitted for acute on chronic hypoxic respiratory failure in the setting of COVID 19 pneumonia, COPD exacerbation, heart failure, in addition to superimposed bacterial pneumonia.   Assessment & Plan:   Principal Problem:   Acute on chronic respiratory failure (HCC) Active Problems:   COPD exacerbation (HCC)   Hypertension   Diabetes mellitus type 2, controlled ([place])   (HFpEF) heart failure with preserved ejection fraction (HCC)   Acute renal failure superimposed on stage 4 chronic kidney disease (HCC)   COPD with acute exacerbation (HCC)   Hyperkalemia   Acute diastolic (congestive) heart failure (HCC)   Anemia due to stage 4 chronic kidney disease (HCC)   Cigarette smoker   Pneumonia due to COVID-19 virus  Acute on Chronic Hypoxic Respiratory Failure 2/2 COVID 19 Infection (baseline 3-4 L [place]) Patient is vaccinated against Covid-19 but has not received booster dose. --COVID test: + 11/14/2020 --requiring ~5 L HFNC this AM, improved (baseline 3-4 L per her report, some discrepancies in chart, but she reports 4 L) --VQ scan without evidence of PE --CXR 2/9  with mild infiltrate at R lung base, decrease in pulm interstitial edema - difficult to exclude small lateral pneumothorax -> CT chest 2/9 without evidence of pneumothorax.  Calcified pleural plaques on R and pleural thickening.  Area of spiculation at the lung base in the R lower lobe (increased in appearance from 2019, but relatively stable from recent CT abdomen/pelvis).  Consider PET CT after discharge.  Chronic interstitial thickening and fibrosis similar to prior exam.  Mild ascites. --Completed 5-day course of remdesivir on 11/01/2020 --taper steroids --actemra/baricitinib avoided due to ?hx diverticulitis and concern for bacterial pneumonia  --Continue supportive care with albuterol MDI prn, vitamin C, zinc, Tylenol, antitussives (benzonatate/ Mucinex/Tussionex) --Follow CBC, CMP, D-dimer, and CRP daily --Continue airborne/contact isolation precautions  --Prone as able, IS, flutter, OOB, therapy  COVID-19 Labs  Recent Labs    11/03/20 0431 11/04/20 0425  DDIMER 0.88* 0.92*  FERRITIN  --  168  CRP 0.7 0.9    Lab Results  Component Value Date   SARSCOV2NAA POSITIVE (Cleophus [place]) 11/07/2020   [place] NEGATIVE 03/27/2020   [place] NEGATIVE 02/18/2020   [place] NEGATIVE 05/21/2019   HFpEF with Exacerbation  Pulmonary Hypertension Continued dependent edema on exam, improved oxygenation Takes 40 mg torsemide daily at home Holding diuretics with AKI Elevated BNP, unclear significance in CKD below, but appears overloaded - improving Echo pending  Acute renal failure on CKD stage IV  Hypernatremia Creatinine baseline 2.3 - 2.45.  Creatinine peaked at 3.3 Worsening to 3.42 in setting of IV diuresis   Continue to hold IV diuretics today, follow - consider resumption of home PO dose in next day or so. Renal US --Hold home  lisinopril --Strict I's and O's and daily weights --consider renal c/s depending on response with diuresis   Community acquired pneumonia, POA Chest x-ray  with right lung base infiltrates (as above) --Completed azithro/ceftriaxone --Continue supplemental oxygen as above  Area of spiculation at the lung base in the right lower lobe adjacent to the diaphragm which measures at least 12 x 10 mm  Needs outpatient follow up  Acute on chronic respiratory failure, POA COPD exacerbation Interstitial lung disease --s/p azithromycin --Continue Solu-Medrol as above --Combivent 1 puff inh q6h  Lactic acidosis, POA Resolved  Type 2 diabetes mellitus Hemoglobin A1c 5.3, well-controlled. Diet controlled at home. SSI  Essential hypertension On amlodipine 10 mg p.o. daily, lisinopril 2.5 mg p.o. daily at home. --holding antihypertensives for borderline hypotension  GERD: Continue PPI  Iron deficiency anemia: Continue ferrous sulfate '325mg'$  PO TID  Thrombocytopenia Possibly 2/2 covid, fluctuating, follow  DVT prophylaxis: heparin  Code Status: full  Family Communication: none at bedside - called sister, no answer 2/11 Disposition:   Status is: Inpatient  Remains inpatient appropriate because:Inpatient level of care appropriate due to severity of illness   Dispo:  Patient From: Home  Planned Disposition: Home  Expected discharge date: 11/06/2020  Medically stable for discharge: No       Consultants:   none  Procedures: Echo pending  Antimicrobials: Anti-infectives (From admission, onward)   Start     Dose/Rate Route Frequency Ordered Stop   10/30/20 2200  azithromycin (ZITHROMAX) tablet 500 mg  Status:  Discontinued        500 mg Oral Daily at bedtime 10/30/20 1103 11/02/20 1105   10/30/20 1700  vancomycin (VANCOREADY) IVPB 500 mg/100 mL  Status:  Discontinued        500 mg 100 mL/hr over 60 Minutes Intravenous Every 48 hours 11/14/2020 1608 11/14/2020 2003   10/29/20 1600  ceFEPIme (MAXIPIME) 2 g in sodium chloride 0.9 % 100 mL IVPB  Status:  Discontinued        2 g 200 mL/hr over 30 Minutes Intravenous Every 24 hours  11/07/2020 1606 11/16/2020 2003   10/29/20 1000  remdesivir 100 mg in sodium chloride 0.9 % 100 mL IVPB  Status:  Discontinued       "Followed by" Linked Group Details   100 mg 200 mL/hr over 30 Minutes Intravenous Daily 11/19/2020 2036 11/08/2020 2048   10/29/20 1000  remdesivir 100 mg in sodium chloride 0.9 % 100 mL IVPB        100 mg 200 mL/hr over 30 Minutes Intravenous Daily 11/01/2020 1819 11/01/20 1109   10/29/20 1000  cefTRIAXone (ROCEPHIN) 1 g in sodium chloride 0.9 % 100 mL IVPB  Status:  Discontinued        1 g 200 mL/hr over 30 Minutes Intravenous Every 24 hours 11/20/2020 2003 11/03/20 1437   11/13/2020 2030  azithromycin (ZITHROMAX) 500 mg in sodium chloride 0.9 % 250 mL IVPB  Status:  Discontinued        500 mg 250 mL/hr over 60 Minutes Intravenous Every 24 hours 11/07/2020 2003 10/30/20 1103   11/03/2020 1848  remdesivir 100 mg in sodium chloride 0.9 % 100 mL IVPB       "Followed by" Linked Group Details   100 mg 200 mL/hr over 30 Minutes Intravenous  Once 11/12/2020 1819 10/27/2020 2217   11/15/2020 1830  remdesivir 200 mg in sodium chloride 0.9% 250 mL IVPB  Status:  Discontinued       "Followed by" Linked Group Details  200 mg 580 mL/hr over 30 Minutes Intravenous Once 11/06/2020 2036 10/26/2020 2048   11/05/2020 1830  remdesivir 100 mg in sodium chloride 0.9 % 100 mL IVPB       "Followed by" Linked Group Details   100 mg 200 mL/hr over 30 Minutes Intravenous  Once 11/07/2020 1819 11/12/2020 2128   10/31/2020 1630  vancomycin (VANCOREADY) IVPB 1250 mg/250 mL  Status:  Discontinued        1,250 mg 166.7 mL/hr over 90 Minutes Intravenous  Once 10/29/2020 1605 11/08/2020 2003   11/04/2020 1600  ceFEPIme (MAXIPIME) 2 g in sodium chloride 0.9 % 100 mL IVPB        2 g 200 mL/hr over 30 Minutes Intravenous  Once 11/13/2020 1557 11/11/2020 1750   11/22/2020 1600  metroNIDAZOLE (FLAGYL) IVPB 500 mg        500 mg 100 mL/hr over 60 Minutes Intravenous  Once 11/07/2020 1557 11/17/2020 2022   10/29/2020 1600  vancomycin  (VANCOCIN) IVPB 1000 mg/200 mL premix  Status:  Discontinued        1,000 mg 200 mL/hr over 60 Minutes Intravenous  Once 11/13/2020 1557 11/11/2020 1605         Subjective: Denies complaints  Objective: Vitals:   11/04/20 0503 11/04/20 1411 11/04/20 2222 11/05/20 0538  BP: 109/67 109/71 102/63 101/78  Pulse: 92 95 96 95  Resp: '17 16  18  '$ Temp: (!) 97.5 F (36.4 C) (!) 97.4 F (36.3 C) 98 F (36.7 C) (!) 97.5 F (36.4 C)  TempSrc: Oral Oral Oral Oral  SpO2: 91% 92% 92% 93%  Weight: 55 kg   57.4 kg  Height:        Intake/Output Summary (Last 24 hours) at 11/05/2020 0858 Last data filed at 11/04/2020 2223 Gross per 24 hour  Intake --  Output 300 ml  Net -300 ml   Filed Weights   11/03/20 1000 11/04/20 0503 11/05/20 0538  Weight: 55.2 kg 55 kg 57.4 kg    Examination:  General: No acute distress. Cardiovascular: Heart sounds show Sameria Morss regular rate, and rhythm Lungs: Clear to auscultation bilaterally Abdomen: Soft, nontender, nondistended Neurological: Alert and oriented 3. Moves all extremities 4. Cranial nerves II through XII grossly intact. Skin: Warm and dry. No rashes or lesions. Extremities: no LEE, dependent edema  Data Reviewed: I have personally reviewed following labs and imaging studies  CBC: Recent Labs  Lab 10/31/20 0236 11/01/20 0229 11/02/20 0431 11/03/20 0431 11/04/20 0425 11/05/20 0425  WBC 7.9 7.7 6.4 6.5 5.6 6.7  NEUTROABS 7.4 7.0 5.8  --  5.0 5.9  HGB 12.0 12.0 12.2 12.4 12.3 12.7  HCT 36.6 37.3 36.8 37.7 37.9 38.3  MCV 87.6 87.4 85.2 86.1 86.7 84.7  PLT 152 137* 126* 134* 113* 119*    Basic Metabolic Panel: Recent Labs  Lab 11/01/20 0229 11/02/20 0431 11/03/20 0431 11/04/20 0425 11/05/20 0425  NA 140 140 143 146* 141  K 4.5 4.4 4.5 4.7 5.0  CL 93* 95* 96* 98 94*  CO2 '29 28 28 25 29  '$ GLUCOSE 142* 110* 116* 86 162*  BUN 99* 111* 114* 122* 126*  CREATININE 2.89* 3.06* 3.02* 3.18* 3.42*  CALCIUM 7.9* 7.9* 7.9* 7.8* 7.8*  MG  --    --  2.1 2.3 2.0  PHOS  --   --   --  6.3* 7.3*    GFR: Estimated Creatinine Clearance: 13 mL/min (Kayslee Furey) (by C-G formula based on SCr of 3.42 mg/dL (H)).  Liver Function  Tests: Recent Labs  Lab 11/01/20 0229 11/02/20 0431 11/03/20 0431 11/04/20 0425 11/05/20 0425  AST '27 31 22 24 20  '$ ALT 28 33 32 30 32  ALKPHOS 92 90 84 83 82  BILITOT 1.4* 1.2 1.1 1.3* 1.8*  PROT 6.0* 6.2* 6.1* 5.8* 6.2*  ALBUMIN 3.4* 3.4* 3.5 3.3* 3.4*    CBG: Recent Labs  Lab 11/04/20 1152 11/04/20 1631 11/04/20 2101 11/04/20 2216 11/05/20 0750  GLUCAP 116* 137* 145* 153* 149*     Recent Results (from the past 240 hour(s))  SARS Coronavirus 2 by RT PCR (hospital order, performed in [facility] hospital lab) Nasopharyngeal Nasopharyngeal Swab     Status: Abnormal   Collection Time: 10/27/2020  2:24 PM   Specimen: Nasopharyngeal Swab  Result Value Ref Range Status   SARS Coronavirus 2 POSITIVE ([place]) NEGATIVE Final    Comment: RESULT CALLED TO, READ BACK BY AND VERIFIED WITH: BELTON,C ON 11/06/2020 AT 1650 BY LOY,C (NOTE) SARS-CoV-2 target nucleic acids are DETECTED  SARS-CoV-2 RNA is generally detectable in upper respiratory specimens  during the acute phase of infection.  Positive results are indicative  of the presence of the identified virus, but do not rule out bacterial infection or co-infection with other pathogens not detected by the test.  Clinical correlation with patient history and  other diagnostic information is necessary to determine patient infection status.  The expected result is negative.  Fact Sheet for Patients:   StrictlyIdeas.no   Fact Sheet for Healthcare Providers:   BankingDealers.co.za    This test is not yet approved or cleared by the [place] FDA and  has been authorized for detection and/or diagnosis of SARS-CoV-2 by FDA under an Emergency Use Authorization (EUA).  This EUA will remain in effect (meaning this t est can  be used) for the duration of  the COVID-19 declaration under Section 564(b)(1) of the Act, 21 U.S.C. section 360-bbb-3(b)(1), unless the authorization is terminated or revoked sooner.  Performed at [facility], [address], [place], [place] [address]   Blood Culture (routine x 2)     Status: None   Collection Time: 11/08/2020  4:44 PM   Specimen: BLOOD LEFT ARM  Result Value Ref Range Status   Specimen Description BLOOD LEFT ARM  Final   Special Requests   Final    BOTTLES DRAWN AEROBIC AND ANAEROBIC Blood Culture adequate volume   Culture   Final    NO GROWTH 5 DAYS Performed at [facility], [address] [place], [place] [address]    Report Status 11/02/2020 FINAL  Final  Blood Culture (routine x 2)     Status: None   Collection Time: 11/08/2020  4:44 PM   Specimen: BLOOD RIGHT FOREARM  Result Value Ref Range Status   Specimen Description BLOOD RIGHT FOREARM  Final   Special Requests   Final    BOTTLES DRAWN AEROBIC AND ANAEROBIC Blood Culture adequate volume   Culture   Final    NO GROWTH 5 DAYS Performed at [facility], [address] [place], [place] [address]    Report Status 11/02/2020 FINAL  Final  MRSA PCR Screening     Status: None   Collection Time: 10/29/20 12:53 AM   Specimen: Nasopharyngeal  Result Value Ref Range Status   MRSA by PCR NEGATIVE NEGATIVE Final    Comment:        The GeneXpert MRSA Assay (FDA approved for NASAL specimens only), is one component of [place] comprehensive MRSA colonization  surveillance program. It is not intended to diagnose MRSA infection nor to guide or monitor treatment for MRSA infections. Performed at [facility], [place] [address] [place], [place] [address]          Radiology Studies: CT CHEST WO CONTRAST  Result Date: 11/03/2020 CLINICAL DATA:  Possible right pneumothorax on recent plain film EXAM: CT CHEST WITHOUT CONTRAST TECHNIQUE: Multidetector CT imaging of the chest was performed following the  standard protocol without IV contrast. COMPARISON:  Film from earlier in the same day. FINDINGS: Cardiovascular: Somewhat limited due to lack of IV contrast. Aortic atherosclerotic changes are noted. Coronary calcifications are seen. Heart is enlarged in size. The pulmonary artery as visualized shows no acute abnormality. Mediastinum/Nodes: Thoracic inlet is within normal limits. Scattered small lymph nodes are again seen and stable likely of Adiyah Lame reactive nature. Some of these are calcified in nature likely related to prior granulomatous disease. The esophagus as visualized is within normal limits. Lungs/Pleura: Filling defect is noted at the level of the carina likely related to mucous. Somewhat spiculated soft tissue nodule is noted in the right lower lobe best seen on image number 107 of series 5 similar to that seen on prior CT of the abdomen and pelvis. It is larger than on prior CT from 2019. The lungs again demonstrate diffuse emphysematous change with fibrotic changes and honeycombing similar to that seen on prior CT of the chest. Scattered calcifications are noted likely related to prior granulomatous disease. Scarring is noted in the bases bilaterally. Significant improved aeration when compared with the most recent CT of the abdomen and pelvis is seen. Calcified pleural plaques and pleural thickening are seen on the right which accounts for the density seen on recent plain film. No pneumothorax is noted. Upper Abdomen: Visualized upper abdomen demonstrates mild ascites. Right kidney is shrunken no other focal abnormality is seen. Musculoskeletal: Degenerative changes of the thoracic spine are noted. No acute rib abnormality is seen. IMPRESSION: No evidence of pneumothorax. Calcified pleural plaques on the right and pleural thickening account for the density seen on recent chest x-ray. Area of spiculation at the lung base in the right lower lobe adjacent to the diaphragm which measures at least 12 x 10 mm  and is increased in appearance when compared with the prior exam of 2019 but relatively stable from the most recent CT of the abdomen and pelvis. PET-CT may be helpful for further evaluation to assess for activity when the patient's condition improves. Chronic interstitial thickening and fibrosis similar to that seen on prior exam. No acute infiltrate is noted. Improved aeration in the bases is seen when compared with the prior exam. Mild ascites Aortic Atherosclerosis (ICD10-I70.0). Electronically Signed   By: Inez Catalina M.D.   On: 11/03/2020 15:35   ECHOCARDIOGRAM LIMITED  Result Date: 11/03/2020    ECHOCARDIOGRAM LIMITED REPORT   Patient Name:   METZI DHANANI Date of Exam: 11/03/2020 Medical Rec #:  [id number]        Height:       64.0 in Accession #:    [id number]       Weight:       121.5 lb Date of Birth:  May 26, 1951        BSA:          1.583 m Patient Age:    60 years         BP:           108/76 mmHg Patient Gender:  F                HR:           107 bpm. Exam Location:  Inpatient Procedure: Limited Echo, Cardiac Doppler and Color Doppler Indications:    Elevated brain natriuretic peptide (BNP) level [id number]  History:        Patient has prior history of Echocardiogram examinations, most                 recent 03/28/2020. COPD; Risk Factors:Hypertension, Diabetes,                 Dyslipidemia and Current Smoker.  Sonographer:    Vickie Epley RDCS Referring Phys: [phone] Lizania Bouchard CALDWELL POWELL JR  Sonographer Comments: Covid positive. IMPRESSIONS  1. ? ascites noted on subcostoal images.  2. Left ventricular ejection fraction, by estimation, is 60 to 65%. The left ventricle has normal function. The left ventricle has no regional wall motion abnormalities. Left ventricular diastolic parameters were normal.  3. Marked cor pulmonale with flattened septum in both systole and diastole. Right ventricular systolic function is severely reduced. The right ventricular size is severely enlarged.  4. Right atrial size was  severely dilated.  5. The mitral valve is normal in structure. Mild mitral valve regurgitation. No evidence of mitral stenosis.  6. Tricuspid valve regurgitation is severe.  7. The aortic valve is normal in structure. Aortic valve regurgitation is not visualized. No aortic stenosis is present.  8. The inferior vena cava is dilated in size with <50% respiratory variability, suggesting right atrial pressure of 15 mmHg. FINDINGS  Left Ventricle: Left ventricular ejection fraction, by estimation, is 60 to 65%. The left ventricle has normal function. The left ventricle has no regional wall motion abnormalities. The left ventricular internal cavity size was normal in size. There is  no left ventricular hypertrophy. Left ventricular diastolic parameters were normal. Right Ventricle: Marked cor pulmonale with flattened septum in both systole and diastole. The right ventricular size is severely enlarged. Right vetricular wall thickness was not assessed. Right ventricular systolic function is severely reduced. Left Atrium: Left atrial size was normal in size. Right Atrium: Right atrial size was severely dilated. Pericardium: There is no evidence of pericardial effusion. Mitral Valve: The mitral valve is normal in structure. Mild mitral valve regurgitation. No evidence of mitral valve stenosis. Tricuspid Valve: The tricuspid valve is normal in structure. Tricuspid valve regurgitation is severe. No evidence of tricuspid stenosis. Aortic Valve: The aortic valve is normal in structure. Aortic valve regurgitation is not visualized. No aortic stenosis is present. Pulmonic Valve: The pulmonic valve was normal in structure. Pulmonic valve regurgitation is mild. No evidence of pulmonic stenosis. Aorta: The aortic root is normal in size and structure. Venous: The inferior vena cava is dilated in size with less than 50% respiratory variability, suggesting right atrial pressure of 15 mmHg. IAS/Shunts: No atrial level shunt detected by  color flow Doppler. Additional Comments: ? ascites noted on subcostoal images. LEFT VENTRICLE PLAX 2D LVIDd:         3.90 cm     Diastology LVIDs:         3.40 cm     LV e' medial:    4.80 cm/s LV PW:         0.60 cm     LV E/e' medial:  12.6 LV IVS:        0.60 cm     LV e' lateral:   7.20 cm/s  LVOT diam:     1.80 cm     LV E/e' lateral: 8.4 LV SV:         23 LV SV Index:   15 LVOT Area:     2.54 cm  LV Volumes (MOD) LV vol d, MOD A4C: 42.8 ml LV vol s, MOD A4C: 21.5 ml LV SV MOD A4C:     42.8 ml RIGHT VENTRICLE RV S prime:     5.62 cm/s TAPSE (M-mode): 0.7 cm LEFT ATRIUM         Index      RIGHT ATRIUM           Index LA diam:    2.80 cm 1.77 cm/m RA Area:     25.80 cm                                RA Volume:   87.60 ml  55.35 ml/m  AORTIC VALVE LVOT Vmax:   70.50 cm/s LVOT Vmean:  40.200 cm/s LVOT VTI:    0.091 m MITRAL VALVE               TRICUSPID VALVE MV Area (PHT): 4.31 cm    TR Peak grad:   51.0 mmHg MV Decel Time: 176 msec    TR Vmax:        357.00 cm/s MR Peak grad: 80.6 mmHg MR Vmax:      449.00 cm/s  SHUNTS MV E velocity: 60.40 cm/s  Systemic VTI:  0.09 m MV Burgandy Hackworth velocity: 65.60 cm/s  Systemic Diam: 1.80 cm MV E/Analysa Nutting ratio:  0.92 Jenkins Rouge MD Electronically signed by Jenkins Rouge MD Signature Date/Time: 11/03/2020/3:30:32 PM    Final         Scheduled Meds: . Chlorhexidine Gluconate Cloth  6 each Topical Daily  . ferrous sulfate  325 mg Oral TID  . heparin injection (subcutaneous)  5,000 Units Subcutaneous Q8H  . insulin aspart  0-9 Units Subcutaneous TID WC  . Ipratropium-Albuterol  1 puff Inhalation Q6H  . mouth rinse  15 mL Mouth Rinse BID  . methylPREDNISolone (SOLU-MEDROL) injection  40 mg Intravenous Q12H  . pantoprazole  40 mg Oral Daily   Continuous Infusions:    LOS: 8 days    Time spent: over 30 min    Fayrene Helper, MD [facility]   To contact the attending provider between 7A-7P or the covering provider during after hours 7P-7A, please log into the  web site [email or web address] and access using universal [place] password for that web site. If you do not have the password, please call the hospital operator.  11/05/2020, 8:58 AM

## 2020-11-05 NOTE — Care Management Important Message (Signed)
Medicare IM printed for Eric Anterhaus to give to the patient.  

## 2020-11-05 NOTE — Consult Note (Signed)
Renal Service Consult Note [facility]  Erica [name] 11/05/2020 Sol Blazing, MD Requesting Physician: Dr. Florene Glen, C.   Reason for Consult: Renal failure HPI: The patient is a 70 y.o. year-old w/ hx of GIB, HTN, COPD, pulm HTN, COPD, interstitial lung disease, chron diast CHF, CKD IV, DJD presented on 10/31/2020 w/ ^'d SOB. EMS found sats' 64% on usual 4L home O2. Given duoneb and IV solumedrol. CXR shows IS changes chronic , pt was COVID+. Pt felt to have multifact a/c resp failure w/ covid pna, vol overload and copd exacerbation. Rx'd w/ remdesivir, IV steroids. Low dose IV lasix given due to soft BP's. Creat up today to 3.4. Asked to see for renal failure.   Pt rec'd IV or po lasix 40 bid the last 5-6 days, now on hold due to creat bump. Wt's show 55kg on admit and 57kg today. BP's low 100- 110's mostly since admit.  Total I/O 3.7 L in and 5.5 L out =  Negative 1.8 L net.   Pt seen in room, says legs are not usually swollen at home.  Denies any c/o's at this time. On nasal O2.    ROS  denies CP  no joint pain   no HA  no blurry vision  no rash  no diarrhea  no nausea/ vomiting  no dysuria  no difficulty voiding  no change in urine color    Past Medical History  Past Medical History:  Diagnosis Date  . Arthritis   . Chronic back pain   . CKD (chronic kidney disease), stage IV ([place])   . COPD (chronic obstructive pulmonary disease) ([facility])    2L home O2  . Diastolic heart failure ([place])   . Essential hypertension   . History of GI bleed    Suspected AVMs  . History of stroke   . Hyperlipidemia   . Interstitial lung disease ([place])   . PSVT (paroxysmal supraventricular tachycardia) ([place])   . Type 2 diabetes mellitus [facility])    Past Surgical History  Past Surgical History:  Procedure Laterality Date  . ABDOMINAL HYSTERECTOMY    . COLONOSCOPY  2009   Dr. Gala Romney: cluster of diminutive rectal polyps and 2 diminutive polyps in rectosigmoid junction,  hyperplastic  . COLONOSCOPY WITH PROPOFOL N/A 10/18/2017   Procedure: COLONOSCOPY WITH PROPOFOL;  Surgeon: Daneil Dolin, MD;  Location: [facility];  Service: Endoscopy;  Laterality: N/A;  9:30am  . ESOPHAGOGASTRODUODENOSCOPY N/A 01/25/2018   Procedure: ESOPHAGOGASTRODUODENOSCOPY (EGD);  Surgeon: Danie Binder, MD;  Location: [facility];  Service: Endoscopy;  Laterality: N/A;  . ESOPHAGOGASTRODUODENOSCOPY (EGD) WITH PROPOFOL N/A 08/29/2017   Dr. Gala Romney with propofol: small hiatal hernia. 4cm fundal diverticulum.   Marland Kitchen GIVENS CAPSULE STUDY N/A 01/25/2018   Procedure: GIVENS CAPSULE STUDY;  Surgeon: Danie Binder, MD;  Location: [facility];  Service: Endoscopy;  Laterality: N/A;  . HAND SURGERY     In an accident at work, finger amputated   Family History  Family History  Problem Relation Age of Onset  . CVA Mother 72  . Cancer Neg Hx   . Colon cancer Neg Hx   . Breast cancer Neg Hx    Social History  reports that she has been smoking cigarettes. She has a 13.00 pack-year smoking history. She has never used smokeless tobacco. She reports that she does not drink alcohol and does not use drugs. Allergies No Known Allergies Home medications Prior to Admission medications  Medication Sig Start Date End Date Taking? Authorizing Provider  albuterol (VENTOLIN HFA) 108 (90 Base) MCG/ACT inhaler Inhale 1 puff into the lungs every 6 (six) hours as needed for shortness of breath. 03/25/20  Yes [provider]  cholecalciferol (VITAMIN D) 25 MCG (1000 UNIT) tablet Take 1,000 Units by mouth daily. 10/08/20  Yes [provider]  famotidine (PEPCID) 20 MG tablet Take 10 mg by mouth daily. 03/29/18  Yes [provider]  FEROSUL 325 (65 Fe) MG tablet Take 325 mg by mouth 3 (three) times daily. 10/08/20  Yes [provider]  magnesium oxide (MAG-OX) 400 (241.3 Mg) MG tablet Take 400 mg by mouth daily. 10/08/20  Yes [provider]  torsemide (DEMADEX) 20 MG  tablet Take 40 mg by mouth daily.   Yes [provider]  ferrous sulfate 325 (65 FE) MG EC tablet Take 1 tablet (325 mg total) by mouth 2 (two) times daily. Patient not taking: Reported on 10/29/2020 01/25/18 08/26/20  Isaac Bliss, Rayford Halsted, MD  Tiotropium Bromide-Olodaterol (STIOLTO RESPIMAT) 2.5-2.5 MCG/ACT AERS Inhale 2 puffs into the lungs daily. Patient not taking: Reported on 10/29/2020 08/26/20   Christinia Gully B, MD     Vitals:   11/04/20 1411 11/04/20 2222 11/05/20 0538 11/05/20 1317  BP: 109/71 102/63 101/78 (!) 94/57  Pulse: 95 96 95 97  Resp: '16  18 18  ' Temp: (!) 97.4 F (36.3 C) 98 F (36.7 C) (!) 97.5 F (36.4 C) (!) 97.5 F (36.4 C)  TempSrc: Oral Oral Oral Oral  SpO2: 92% 92% 93% 94%  Weight:   57.4 kg   Height:       Exam Gen alert, no distress, nasal O2 No rash, cyanosis or gangrene Sclera anicteric, throat clear No jvd or bruits Chest poor air movement bilat, no rales or wheezing RRR no MRG Abd soft ntnd no mass or ascites +bs GU  defer MS no joint effusions or deformity Ext no LE or UE edema, no wounds or ulcers Neuro is alert, Ox 3 , nf    Home meds:  - demedex 40 qd  - pepcid 10  - stiolto respimat  - prn's/ vitamins/ supplements         Date   Creat  eGFR   2008   0.81   2009- 2018  1.10- 1.70   2019   1.60- 2.40 AKI 4.4, AKI 2.8   2020   2.40- 3.32 15- 22 ml/min    2021   2.23- 2.51 22- 25 ml/min   Oct 28, 2020  3.28  22   Feb 6   2.39   Feb 9   3.02  16   Feb 10  3.18  15   Feb 11   3.42  14      CT chest 11/03/20 - IMPRESSION: The lungs again demonstrate diffuse emphysematous change with fibrotic changes and honeycombing similar to that seen on prior CT of the chest. Scattered calcifications are noted likely related to prior granulomatous disease. Calcified pleural plaques and pleural thickening are seen on the right which accounts for the density seen on recent plain film    CT chest 3.27/19 - IMPRESSION: Calcified pleural  plaques are noted in the thorax bilaterally, indicative of asbestos related pleural disease. Diffuse bronchial wall thickening with mild centrilobular and paraseptal emphysema. High-resolution images demonstrate patchy multifocal areas of mild ground-glass attenuation, extensive septal thickening, thickening of the peribronchovascular interstitium, scattered areas of mild cylindrical bronchiectasis and peripheral bronchiolectasis,  as well as areas of honeycombing. These findings have no well-defined craniocaudal gradient, and the greatest degree of honeycombing is in the upper lungs.     ECHO 11/03/20 - IMPRESSION:  1. Left ventricular ejection fraction, by estimation, is 60 to 65%. The  left ventricle has normal function. The left ventricle has no regional  wall motion abnormalities. Left ventricular diastolic parameters were  normal.  2. Marked cor pulmonale with flattened septum in both systole and  diastole. Right ventricular systolic function is severely reduced. The  right ventricular size is severely enlarged.     Renal US - R kidney 5.7 cm, atrophic and diffuse ^echo similar to prior CT. L kidney 9.5 com, no hydro or ^echo.     UA - not done   Assessment/ Plan: 1. AKI on CKD IV - b/l creat 2.2- 2.5 from 2021, eGFR 22-25 ml/min. Creat here 3.2 on admit, improved to 2.39, now back up to 3.42 today. Renal US one atrophic kidney, no hydro on the other kidney. UA not done.  Pt looks dry. I think the issue may be that the CXR's are reflecting interstitial lung disease and not vol overload/ pulm edema. She has sig honeycombing on prior / current CT"s in the mid to high lung zones which is atypical and would contribute to chronic IS changes on CXR.  AKI due to vol depletion. Recommend IV saline and f/u labs in the morning. Will follow.  2. ILD - IS fibrosis on CT scans back to 2019, on home O2  3. Cor pulmonale - due to bad lung disease/ COPD/ ILD 4. BP - bp's soft, not on any BP lowering meds at home.   Should improve a bit w/ IVF"s.  5. COVID infeciton - per pmd      Kelly Splinter  MD 11/05/2020, 4:12 PM  Recent Labs  Lab 11/04/20 0425 11/05/20 0425  WBC 5.6 6.7  HGB 12.3 12.7   Recent Labs  Lab 11/04/20 0425 11/05/20 0425  K 4.7 5.0  BUN 122* 126*  CREATININE 3.18* 3.42*  CALCIUM 7.8* 7.8*  PHOS 6.3* 7.3*

## 2020-11-06 ENCOUNTER — Inpatient Hospital Stay (HOSPITAL_COMMUNITY): Payer: Medicare Other

## 2020-11-06 DIAGNOSIS — J9621 Acute and chronic respiratory failure with hypoxia: Secondary | ICD-10-CM | POA: Diagnosis not present

## 2020-11-06 LAB — HEPATITIS PANEL, ACUTE
HCV Ab: NONREACTIVE
Hep A IgM: NONREACTIVE
Hep B C IgM: NONREACTIVE
Hepatitis B Surface Ag: NONREACTIVE

## 2020-11-06 LAB — COMPREHENSIVE METABOLIC PANEL
ALT: 111 U/L — ABNORMAL HIGH (ref 0–44)
AST: 135 U/L — ABNORMAL HIGH (ref 15–41)
Albumin: 3.3 g/dL — ABNORMAL LOW (ref 3.5–5.0)
Alkaline Phosphatase: 80 U/L (ref 38–126)
Anion gap: 21 — ABNORMAL HIGH (ref 5–15)
BUN: 151 mg/dL — ABNORMAL HIGH (ref 8–23)
CO2: 24 mmol/L (ref 22–32)
Calcium: 7.3 mg/dL — ABNORMAL LOW (ref 8.9–10.3)
Chloride: 95 mmol/L — ABNORMAL LOW (ref 98–111)
Creatinine, Ser: 3.72 mg/dL — ABNORMAL HIGH (ref 0.44–1.00)
GFR, Estimated: 12 mL/min — ABNORMAL LOW (ref >60)
Glucose, Bld: 174 mg/dL — ABNORMAL HIGH (ref 70–99)
Potassium: 5 mmol/L (ref 3.5–5.1)
Sodium: 140 mmol/L (ref 135–145)
Total Bilirubin: 1.8 mg/dL — ABNORMAL HIGH (ref 0.3–1.2)
Total Protein: 5.9 g/dL — ABNORMAL LOW (ref 6.5–8.1)

## 2020-11-06 LAB — GLUCOSE, CAPILLARY
Glucose-Capillary: 148 mg/dL — ABNORMAL HIGH (ref 70–99)
Glucose-Capillary: 140 mg/dL — ABNORMAL HIGH (ref 70–99)
Glucose-Capillary: 143 mg/dL — ABNORMAL HIGH (ref 70–99)
Glucose-Capillary: 94 mg/dL (ref 70–99)

## 2020-11-06 LAB — CBC WITH DIFFERENTIAL/PLATELET
Abs Immature Granulocytes: 0.06 10*3/uL (ref 0.00–0.07)
Basophils Absolute: 0 10*3/uL (ref 0.0–0.1)
Basophils Relative: 0 %
Eosinophils Absolute: 0 10*3/uL (ref 0.0–0.5)
Eosinophils Relative: 0 %
HCT: 39.5 % (ref 36.0–46.0)
Hemoglobin: 13.1 g/dL (ref 12.0–15.0)
Immature Granulocytes: 1 %
Lymphocytes Relative: 3 %
Lymphs Abs: 0.2 10*3/uL — ABNORMAL LOW (ref 0.7–4.0)
MCH: 28.2 pg (ref 26.0–34.0)
MCHC: 33.2 g/dL (ref 30.0–36.0)
MCV: 84.9 fL (ref 80.0–100.0)
Monocytes Absolute: 0.6 10*3/uL (ref 0.1–1.0)
Monocytes Relative: 9 %
Neutro Abs: 5.6 10*3/uL (ref 1.7–7.7)
Neutrophils Relative %: 87 %
Platelets: 105 10*3/uL — ABNORMAL LOW (ref 150–400)
RBC: 4.65 MIL/uL (ref 3.87–5.11)
RDW: 15.5 % (ref 11.5–15.5)
WBC: 6.5 10*3/uL (ref 4.0–10.5)
nRBC: 0.5 % — ABNORMAL HIGH (ref 0.0–0.2)

## 2020-11-06 LAB — PHOSPHORUS: Phosphorus: 6.2 mg/dL — ABNORMAL HIGH (ref 2.5–4.6)

## 2020-11-06 LAB — D-DIMER, QUANTITATIVE: D-Dimer, Quant: 0.88 ug/mL-FEU — ABNORMAL HIGH (ref 0.00–0.50)

## 2020-11-06 LAB — MAGNESIUM: Magnesium: 2.2 mg/dL (ref 1.7–2.4)

## 2020-11-06 LAB — FERRITIN: Ferritin: 243 ng/mL (ref 11–307)

## 2020-11-06 LAB — C-REACTIVE PROTEIN: CRP: 1.5 mg/dL — ABNORMAL HIGH (ref <1.0)

## 2020-11-06 MED ORDER — MIDODRINE HCL 5 MG PO TABS
10.0000 mg | ORAL_TABLET | Freq: Three times a day (TID) | ORAL | Status: DC
  Administered 2020-11-06 – 2020-11-09 (×10): 10 mg via ORAL
  Filled 2020-11-06 (×10): qty 2

## 2020-11-06 MED ORDER — SODIUM CHLORIDE 0.9 % IV BOLUS
500.0000 mL | Freq: Once | INTRAVENOUS | Status: AC
  Administered 2020-11-06: 500 mL via INTRAVENOUS

## 2020-11-06 NOTE — Plan of Care (Signed)
  Problem: Education: Goal: Knowledge of General Education information will improve Description: Including pain rating scale, medication(s)/side effects and non-pharmacologic comfort measures Outcome: Progressing   Problem: Health Behavior/Discharge Planning: Goal: Ability to manage health-related needs will improve Outcome: Progressing   Problem: Clinical Measurements: Goal: Ability to maintain clinical measurements within normal limits will improve Outcome: Progressing Goal: Will remain free from infection Outcome: Progressing Goal: Diagnostic test results will improve Outcome: Progressing Goal: Respiratory complications will improve Outcome: Progressing Goal: Cardiovascular complication will be avoided Outcome: Progressing   Problem: Activity: Goal: Risk for activity intolerance will decrease Outcome: Progressing   Problem: Nutrition: Goal: Adequate nutrition will be maintained Outcome: Progressing   Problem: Coping: Goal: Level of anxiety will decrease Outcome: Progressing   Problem: Elimination: Goal: Will not experience complications related to bowel motility Outcome: Progressing Goal: Will not experience complications related to urinary retention Outcome: Progressing   Problem: Pain Managment: Goal: General experience of comfort will improve Outcome: Progressing   Problem: Safety: Goal: Ability to remain free from injury will improve Outcome: Progressing   Problem: Skin Integrity: Goal: Risk for impaired skin integrity will decrease Outcome: Progressing   Problem: Education: Goal: Knowledge of risk factors and measures for prevention of condition will improve Outcome: Progressing   Problem: Coping: Goal: Psychosocial and spiritual needs will be supported Outcome: Progressing   Problem: Respiratory: Goal: Will maintain a patent airway Outcome: Progressing Goal: Complications related to the disease process, condition or treatment will be avoided or  minimized Outcome: Progressing   Problem: Education: Goal: Knowledge of disease or condition will improve Outcome: Progressing Goal: Knowledge of the prescribed therapeutic regimen will improve Outcome: Progressing Goal: Individualized Educational Video(s) Outcome: Progressing   Problem: Activity: Goal: Ability to tolerate increased activity will improve Outcome: Progressing Goal: Will verbalize the importance of balancing activity with adequate rest periods Outcome: Progressing   Problem: Respiratory: Goal: Ability to maintain a clear airway will improve Outcome: Progressing Goal: Levels of oxygenation will improve Outcome: Progressing Goal: Ability to maintain adequate ventilation will improve Outcome: Progressing

## 2020-11-06 NOTE — Progress Notes (Signed)
Physical Therapy Treatment Patient Details Name: Erica [name] MRN: [id number] DOB: Sep 24, 1951 Today's Date: 11/06/2020    History of Present Illness Pt is 70 year old female with past medical history significant for COPD/chronic hypoxic respiratory failure on 2 L nasal cannula at baseline, interstitial lung disease, type 2 diabetes mellitus, chronic kidney disease stage IV, chronic diastolic congestive heart failure who presented to the ED with progressive shortness of breath. Covid-19 + with PNA, CXR demonstrates cardiomegaly, vascular congestion and small bilateral pleural effusions with right lung base atelectasis versus developing infiltrate.    PT Comments    Pt reports just getting back in bed and "doesn't feel like doing anything."  She was agreeable to exercises in bed with encouragement/education.  Pt presents with significant weakness with exercises requiring cues to prevent compensation and rest breaks to maintain O2 sats.   Hopeful to progress OOB activity next PT visit.    Follow Up Recommendations  Home health PT;Supervision for mobility/OOB     Equipment Recommendations  None recommended by PT    Recommendations for Other Services       Precautions / Restrictions Precautions Precautions: Fall Precaution Comments: monitor O2 sats, COPD on 4 L at baseline    Mobility  Bed Mobility               General bed mobility comments: Pt reports she just got back in bed, declined mobility    Transfers                    Ambulation/Gait                 Stairs             Wheelchair Mobility    Modified Rankin (Stroke Patients Only)       Balance                                            Cognition Arousal/Alertness: Awake/alert Behavior During Therapy: Flat affect Overall Cognitive Status: Within Functional Limits for tasks assessed                                 General Comments: Reports  feeling bad and not feeling like doing anything today - agreed to bed exercises with encouragement/education      Exercises General Exercises - Lower Extremity Quad Sets: AROM;Both;10 reps;Supine Heel Slides: AROM;Both;10 reps;Supine (with cues to limit hip ER; mod resistance on eccentric phase) Straight Leg Raises: AAROM;Both;5 reps;Supine Other Exercises Other Exercises: Hip abd/add in hook lying clam shells: mod resistance abd and pillow squeeze add 10 reps 3 sec hold Other Exercises: IS x 10 to 500 mL with cues for correct use Other Exercises: transition to long sitting x 2 from [place] 45 degrees    General Comments General comments (skin integrity, edema, etc.): Pt on 7 L O2 HFNC with sats 91% rest dropping to 81% with exercises requiring rest breaks after every 2 exercises and cues for pursed lip breathing.  Did recover O2 sats in 1-1.5 mins.      Pertinent Vitals/Pain Pain Assessment: No/denies pain    Home Living                      Prior Function  PT Goals (current goals can now be found in the care plan section) Acute Rehab PT Goals Patient Stated Goal: get better and go home PT Goal Formulation: With patient Time For Goal Achievement: 11/15/20 Potential to Achieve Goals: Good Progress towards PT goals: Not progressing toward goals - comment (pt not feeling well today-declined OOB activity)    Frequency    Min 3X/week      PT Plan Current plan remains appropriate    Co-evaluation              AM-PAC PT "6 Clicks" Mobility   Outcome Measure  Help needed turning from your back to your side while in a flat bed without using bedrails?: None Help needed moving from lying on your back to sitting on the side of a flat bed without using bedrails?: A Little Help needed moving to and from a bed to a chair (including a wheelchair)?: A Little Help needed standing up from a chair using your arms (e.g., wheelchair or bedside chair)?: A  Little Help needed to walk in hospital room?: A Little Help needed climbing 3-5 steps with a railing? : A Lot 6 Click Score: 18    End of Session Equipment Utilized During Treatment: Oxygen Activity Tolerance: Patient limited by fatigue Patient left: in bed;with call bell/phone within reach;with bed alarm set   PT Visit Diagnosis: Muscle weakness (generalized) (M62.81);Unsteadiness on feet (R26.81)     Time: [id number] PT Time Calculation (min) (ACUTE ONLY): 18 min  Charges:  $Therapeutic Exercise: 8-22 mins                     Abran Richard, PT Acute Rehab Services Pager [phone] [facility] [phone]     Erica [name] 11/06/2020, 3:12 PM

## 2020-11-06 NOTE — Progress Notes (Addendum)
PROGRESS NOTE    Erica [name]  [id number] DOB: 1951-06-09 DOA: 11/01/2020 PCP: [facility]   Chief Complaint  Patient presents with  . Shortness of Breath    Brief Narrative:  Erica [name] is Erica [name] 70 year old female with past medical history significant for COPD/chronic hypoxic respiratory failure on 4 L nasal cannula at baseline, interstitial lung disease, type 2 diabetes mellitus, chronic kidney disease stage IV, chronic diastolic congestive heart failure who presented to the ED with progressive shortness of breath. Patient also with associated dizziness, cough, increased work of breathing. Patient denies any productive cough/phlegm, no fever/chills, no recent weight gain or lower extremity edema. Patient reports vaccinated against Covid-19, but has not yet received Erica [name] booster dose.  She was admitted for acute on chronic hypoxic respiratory failure in the setting of COVID 19 pneumonia, COPD exacerbation, heart failure, in addition to superimposed bacterial pneumonia.   Assessment & Plan:   Principal Problem:   Acute on chronic respiratory failure (HCC) Active Problems:   COPD exacerbation (HCC)   Hypertension   Diabetes mellitus type 2, controlled ([place])   (HFpEF) heart failure with preserved ejection fraction (HCC)   Acute renal failure superimposed on stage 4 chronic kidney disease (HCC)   COPD with acute exacerbation (HCC)   Hyperkalemia   Acute diastolic (congestive) heart failure (HCC)   Anemia due to stage 4 chronic kidney disease (HCC)   Cigarette smoker   Pneumonia due to COVID-19 virus  Acute on Chronic Hypoxic Respiratory Failure 2/2 COVID 19 Infection (baseline 3-4 L [place]) Patient is vaccinated against Covid-19 but has not received booster dose. --COVID test: + 11/07/2020 --requiring ~6 L HFNC this AM - wean as tolerated (baseline 3-4 L per her report, some discrepancies in chart, but she reports 4 L) --VQ scan without evidence of  PE --CXR 2/9 with mild infiltrate at R lung base, decrease in pulm interstitial edema - difficult to exclude small lateral pneumothorax -> CT chest 2/9 without evidence of pneumothorax.  Calcified pleural plaques on R and pleural thickening.  Area of spiculation at the lung base in the R lower lobe (increased in appearance from 2019, but relatively stable from recent CT abdomen/pelvis).  Consider PET CT after discharge.  Chronic interstitial thickening and fibrosis similar to prior exam.  Mild ascites. --Completed 5-day course of remdesivir on 11/01/2020 --taper steroids --actemra/baricitinib avoided due to ?hx diverticulitis and concern for bacterial pneumonia  --Continue supportive care with albuterol MDI prn, vitamin C, zinc, Tylenol, antitussives (benzonatate/ Mucinex/Tussionex) --Follow CBC, CMP, D-dimer, and CRP daily --Continue airborne/contact isolation precautions  --Prone as able, IS, flutter, OOB, therapy  COVID-19 Labs  Recent Labs    11/04/20 0425 11/06/20 0431  DDIMER 0.92* 0.88*  FERRITIN 168 243  CRP 0.9 1.5*    Lab Results  Component Value Date   SARSCOV2NAA POSITIVE (Shakela Donati) 11/14/2020   [place] NEGATIVE 03/27/2020   [place] NEGATIVE 02/18/2020   [place] NEGATIVE 05/21/2019   HFpEF with Exacerbation  Pulmonary Hypertension Continued dependent edema on exam, improved oxygenation Takes 40 mg torsemide daily at home Holding diuretics with AKI Elevated BNP, unclear significance in CKD below, but appears overloaded - improving Echo pending  Acute renal failure on CKD stage IV  Hypernatremia Creatinine baseline 2.3 - 2.45.  Creatinine peaked at 3.3 Worsening today in setting of being overdiuresed  Continue IVF  Renal US - R kidney with increased echogenicity and atrophy, no lesion or hydro to L kidney, partially distended bladder --Hold  home lisinopril --Strict I's and O's and daily weights --renal c/s, appreciate recs  Community acquired pneumonia,  POA Chest x-ray with right lung base infiltrates (as above) --Completed azithro/ceftriaxone --Continue supplemental oxygen as above  Area of spiculation at the lung base in the right lower lobe adjacent to the diaphragm which measures at least 12 x 10 mm  Needs outpatient follow up  Acute on chronic respiratory failure, POA COPD exacerbation Interstitial lung disease --s/p azithromycin --Continue Solu-Medrol as above --Combivent 1 puff inh q6h  Lactic acidosis, POA Resolved  Type 2 diabetes mellitus Hemoglobin A1c 5.3, well-controlled. Diet controlled at home. SSI  Essential hypertension On amlodipine 10 mg p.o. daily, lisinopril 2.5 mg p.o. daily at home. --BP soft, follow with IVF, holding meds  GERD: Continue PPI  Iron deficiency anemia: Continue ferrous sulfate '325mg'$  PO TID  Thrombocytopenia Possibly 2/2 covid, fluctuating, follow  DVT prophylaxis: heparin  Code Status: full  Family Communication: none at bedside - called sister, 2/12 Disposition:   Status is: Inpatient  Remains inpatient appropriate because:Inpatient level of care appropriate due to severity of illness   Dispo:  Patient From: Home  Planned Disposition: Home with Health Care Svc  Expected discharge date: 11/07/2020  Medically stable for discharge: No       Consultants:   none  Procedures: Echo pending  Antimicrobials: Anti-infectives (From admission, onward)   Start     Dose/Rate Route Frequency Ordered Stop   10/30/20 2200  azithromycin (ZITHROMAX) tablet 500 mg  Status:  Discontinued        500 mg Oral Daily at bedtime 10/30/20 1103 11/02/20 1105   10/30/20 1700  vancomycin (VANCOREADY) IVPB 500 mg/100 mL  Status:  Discontinued        500 mg 100 mL/hr over 60 Minutes Intravenous Every 48 hours 11/17/2020 1608 11/14/2020 2003   10/29/20 1600  ceFEPIme (MAXIPIME) 2 g in sodium chloride 0.9 % 100 mL IVPB  Status:  Discontinued        2 g 200 mL/hr over 30 Minutes Intravenous  Every 24 hours 10/31/2020 1606 11/06/2020 2003   10/29/20 1000  remdesivir 100 mg in sodium chloride 0.9 % 100 mL IVPB  Status:  Discontinued       "Followed by" Linked Group Details   100 mg 200 mL/hr over 30 Minutes Intravenous Daily 11/13/2020 2036 11/16/2020 2048   10/29/20 1000  remdesivir 100 mg in sodium chloride 0.9 % 100 mL IVPB        100 mg 200 mL/hr over 30 Minutes Intravenous Daily 11/02/2020 1819 11/01/20 1109   10/29/20 1000  cefTRIAXone (ROCEPHIN) 1 g in sodium chloride 0.9 % 100 mL IVPB  Status:  Discontinued        1 g 200 mL/hr over 30 Minutes Intravenous Every 24 hours 10/31/2020 2003 11/03/20 1437   11/06/2020 2030  azithromycin (ZITHROMAX) 500 mg in sodium chloride 0.9 % 250 mL IVPB  Status:  Discontinued        500 mg 250 mL/hr over 60 Minutes Intravenous Every 24 hours 11/16/2020 2003 10/30/20 1103   11/08/2020 1848  remdesivir 100 mg in sodium chloride 0.9 % 100 mL IVPB       "Followed by" Linked Group Details   100 mg 200 mL/hr over 30 Minutes Intravenous  Once 10/31/2020 1819 11/18/2020 2217   11/11/2020 1830  remdesivir 200 mg in sodium chloride 0.9% 250 mL IVPB  Status:  Discontinued       "Followed by" Linked Group Details  200 mg 580 mL/hr over 30 Minutes Intravenous Once 11/17/2020 2036 11/20/2020 2048   10/31/2020 1830  remdesivir 100 mg in sodium chloride 0.9 % 100 mL IVPB       "Followed by" Linked Group Details   100 mg 200 mL/hr over 30 Minutes Intravenous  Once 11/19/2020 1819 11/06/2020 2128   11/16/2020 1630  vancomycin (VANCOREADY) IVPB 1250 mg/250 mL  Status:  Discontinued        1,250 mg 166.7 mL/hr over 90 Minutes Intravenous  Once 10/27/2020 1605 11/21/2020 2003   11/05/2020 1600  ceFEPIme (MAXIPIME) 2 g in sodium chloride 0.9 % 100 mL IVPB        2 g 200 mL/hr over 30 Minutes Intravenous  Once 11/20/2020 1557 11/04/2020 1750   11/11/2020 1600  metroNIDAZOLE (FLAGYL) IVPB 500 mg        500 mg 100 mL/hr over 60 Minutes Intravenous  Once 11/19/2020 1557 10/30/2020 2022   11/01/2020 1600   vancomycin (VANCOCIN) IVPB 1000 mg/200 mL premix  Status:  Discontinued        1,000 mg 200 mL/hr over 60 Minutes Intravenous  Once 11/20/2020 1557 11/09/2020 1605         Subjective: Some nausea this AM No other complaints  Objective: Vitals:   11/05/20 0538 11/05/20 1317 11/05/20 2013 11/06/20 0438  BP: 101/78 (!) 94/57 118/77 (!) 89/60  Pulse: 95 97 91 95  Resp: '18 18 18 16  '$ Temp: (!) 97.5 F (36.4 C) (!) 97.5 F (36.4 C) 97.7 F (36.5 C) 97.6 F (36.4 C)  TempSrc: Oral Oral Oral   SpO2: 93% 94% 100% 99%  Weight: 57.4 kg     Height:        Intake/Output Summary (Last 24 hours) at 11/06/2020 0913 Last data filed at 11/05/2020 1836 Gross per 24 hour  Intake 960 ml  Output -  Net 960 ml   Filed Weights   11/03/20 1000 11/04/20 0503 11/05/20 0538  Weight: 55.2 kg 55 kg 57.4 kg    Examination:  General: No acute distress. Chronically ill appearing. Cardiovascular: Heart sounds show Audrina Marten regular rate, and rhythm.  Lungs: Clear to auscultation bilaterally  Abdomen: Soft, nontender, nondistended Neurological: Alert and oriented 3. Moves all extremities 4. Cranial nerves II through XII grossly intact. Skin: Warm and dry. No rashes or lesions. Extremities: No clubbing or cyanosis. No edema.    Data Reviewed: I have personally reviewed following labs and imaging studies  CBC: Recent Labs  Lab 11/01/20 0229 11/02/20 0431 11/03/20 0431 11/04/20 0425 11/05/20 0425 11/06/20 0431  WBC 7.7 6.4 6.5 5.6 6.7 6.5  NEUTROABS 7.0 5.8  --  5.0 5.9 5.6  HGB 12.0 12.2 12.4 12.3 12.7 13.1  HCT 37.3 36.8 37.7 37.9 38.3 39.5  MCV 87.4 85.2 86.1 86.7 84.7 84.9  PLT 137* 126* 134* 113* 119* 105*    Basic Metabolic Panel: Recent Labs  Lab 11/02/20 0431 11/03/20 0431 11/04/20 0425 11/05/20 0425 11/06/20 0431  NA 140 143 146* 141 140  K 4.4 4.5 4.7 5.0 5.0  CL 95* 96* 98 94* 95*  CO2 '28 28 25 29 24  '$ GLUCOSE 110* 116* 86 162* 174*  BUN 111* 114* 122* 126* 151*   CREATININE 3.06* 3.02* 3.18* 3.42* 3.72*  CALCIUM 7.9* 7.9* 7.8* 7.8* 7.3*  MG  --  2.1 2.3 2.0 2.2  PHOS  --   --  6.3* 7.3* 6.2*    GFR: Estimated Creatinine Clearance: 12 mL/min (Eli Adami) (by C-G formula  based on SCr of 3.72 mg/dL (H)).  Liver Function Tests: Recent Labs  Lab 11/02/20 0431 11/03/20 0431 11/04/20 0425 11/05/20 0425 11/06/20 0431  AST '31 22 24 20 '$ 135*  ALT 33 32 30 32 111*  ALKPHOS 90 84 83 82 80  BILITOT 1.2 1.1 1.3* 1.8* 1.8*  PROT 6.2* 6.1* 5.8* 6.2* 5.9*  ALBUMIN 3.4* 3.5 3.3* 3.4* 3.3*    CBG: Recent Labs  Lab 11/04/20 2216 11/05/20 0750 11/05/20 1212 11/05/20 1728 11/06/20 0801  GLUCAP 153* 149* 182* 148* 148*     Recent Results (from the past 240 hour(s))  SARS Coronavirus 2 by RT PCR (hospital order, performed in [place] hospital lab) Nasopharyngeal Nasopharyngeal Swab     Status: Abnormal   Collection Time: 10/29/2020  2:24 PM   Specimen: Nasopharyngeal Swab  Result Value Ref Range Status   SARS Coronavirus 2 POSITIVE ([place]) NEGATIVE Final    Comment: RESULT CALLED TO, READ BACK BY AND VERIFIED WITH: BELTON,C ON 11/17/2020 AT 1650 BY LOY,C (NOTE) SARS-CoV-2 target nucleic acids are DETECTED  SARS-CoV-2 RNA is generally detectable in upper respiratory specimens  during the acute phase of infection.  Positive results are indicative  of the presence of the identified virus, but do not rule out bacterial infection or co-infection with other pathogens not detected by the test.  Clinical correlation with patient history and  other diagnostic information is necessary to determine patient infection status.  The expected result is negative.  Fact Sheet for Patients:   StrictlyIdeas.no   Fact Sheet for Healthcare Providers:   BankingDealers.co.za    This test is not yet approved or cleared by the [place] FDA and  has been authorized for detection and/or diagnosis of SARS-CoV-2 by FDA under an  Emergency Use Authorization (EUA).  This EUA will remain in effect (meaning this t est can be used) for the duration of  the COVID-19 declaration under Section 564(b)(1) of the Act, 21 U.S.C. section 360-bbb-3(b)(1), unless the authorization is terminated or revoked sooner.  Performed at [facility], [address], [place], [place] [address]   Blood Culture (routine x 2)     Status: None   Collection Time: 10/26/2020  4:44 PM   Specimen: BLOOD LEFT ARM  Result Value Ref Range Status   Specimen Description BLOOD LEFT ARM  Final   Special Requests   Final    BOTTLES DRAWN AEROBIC AND ANAEROBIC Blood Culture adequate volume   Culture   Final    NO GROWTH 5 DAYS Performed at [facility], [address], [place], [place] [address]    Report Status 11/02/2020 FINAL  Final  Blood Culture (routine x 2)     Status: None   Collection Time: 10/30/2020  4:44 PM   Specimen: BLOOD RIGHT FOREARM  Result Value Ref Range Status   Specimen Description BLOOD RIGHT FOREARM  Final   Special Requests   Final    BOTTLES DRAWN AEROBIC AND ANAEROBIC Blood Culture adequate volume   Culture   Final    NO GROWTH 5 DAYS Performed at [facility], [address], [place], [place] [address]    Report Status 11/02/2020 FINAL  Final  MRSA PCR Screening     Status: None   Collection Time: 10/29/20 12:53 AM   Specimen: Nasopharyngeal  Result Value Ref Range Status   MRSA by PCR NEGATIVE NEGATIVE Final    Comment:        The GeneXpert MRSA Assay (FDA approved for NASAL  specimens only), is one component of [facility] comprehensive MRSA colonization surveillance program. It is not intended to diagnose MRSA infection nor to guide or monitor treatment for MRSA infections. Performed at [facility], [place] [address], [place], [place] [address]          Radiology Studies: US RENAL  Result Date: 11/05/2020 CLINICAL DATA:  Acute renal injury EXAM: RENAL / URINARY TRACT ULTRASOUND COMPLETE  COMPARISON:  11/18/2020 FINDINGS: Right Kidney: Renal measurements: 5.7 x 2.1 x 2.0 cm. = volume: 12.8 mL. Diffusely increased in echogenicity and atrophied similar to that seen on prior CT examination. Left Kidney: Renal measurements: 9.5 x 5.1 x 4.7 cm = volume: 118 mL. No mass lesion or hydronephrosis is noted. No definitive calculi are seen. Bladder: Partially distended. Other: Note is made of mild ascites as well as evidence of cholelithiasis. Electronically Signed   By: Inez Catalina M.D.   On: 11/05/2020 10:38        Scheduled Meds: . Chlorhexidine Gluconate Cloth  6 each Topical Daily  . ferrous sulfate  325 mg Oral TID  . heparin injection (subcutaneous)  5,000 Units Subcutaneous Q8H  . insulin aspart  0-9 Units Subcutaneous TID WC  . Ipratropium-Albuterol  1 puff Inhalation Q6H  . mouth rinse  15 mL Mouth Rinse BID  . pantoprazole  40 mg Oral Daily  . predniSONE  40 mg Oral Q breakfast   Followed by  . [START ON 11/08/2020] predniSONE  30 mg Oral Q breakfast   Followed by  . [START ON 2020/11/16] predniSONE  20 mg Oral Q breakfast   Followed by  . [START ON 11/12/2020] predniSONE  10 mg Oral Q breakfast   Continuous Infusions: . sodium chloride 85 mL/hr at 11/06/20 0737  . sodium chloride       LOS: 9 days    Time spent: over 30 min    Fayrene Helper, MD [facility]   To contact the attending provider between 7A-7P or the covering provider during after hours 7P-7A, please log into the web site [email or web address] and access using universal [place] password for that web site. If you do not have the password, please call the hospital operator.  11/06/2020, 9:13 AM

## 2020-11-06 NOTE — Progress Notes (Signed)
[facility] Progress Note  Subjective:  Patient not examined today directly given COVID-19 + status, utilizing data taken from chart +/- discussions w/ providers and staff.    Vitals:   11/06/20 1209 11/06/20 1212 11/06/20 1315 11/06/20 1322  BP:      Pulse:      Resp:      Temp:      TempSrc:      SpO2: (!) 84% 92% 100% 95%  Weight:      Height:        Exam:  Patient not examined today directly given COVID-19 + status, utilizing data taken from chart +/- discussions w/ providers and staff.       Home meds:  - demedex 40 qd  - pepcid 10  - stiolto respimat  - prn's/ vitamins/ supplements         Date                          Creat               eGFR   2008                          0.81   2009- 2018                1.10- 1.70   2019                          1.60- 2.40        AKI 4.4, AKI 2.8   2020                          2.40- 3.32        15- 22 ml/min     2021                          2.23- 2.51        22- 25 ml/min   Oct 28, 2020               3.28                 22   Feb 6                         2.39   Feb 9                         3.02                 16   Feb 10                       3.18                 15   Feb 11                       3.42                 14      CT chest 11/03/20 - IMPRESSION: The lungs again demonstrate diffuse emphysematous change with fibrotic changes and honeycombing similar to that seen on prior CT of the chest. Scattered calcifications are  noted likely related to prior granulomatous disease. Calcified pleural plaques and pleural thickening are seen on the right which accounts for the density seen on recent plain film    CT chest 3.27/19 - IMPRESSION: Calcified pleural plaques are noted in the thorax bilaterally, indicative of asbestos related pleural disease. Diffuse bronchial wall thickening with mild centrilobular and paraseptal emphysema. High-resolution images demonstrate patchy multifocal areas of mild  ground-glass attenuation, extensive septal thickening, thickening of the peribronchovascular interstitium, scattered areas of mild cylindrical bronchiectasis and peripheral bronchiolectasis, as well as areas of honeycombing. These findings have no well-defined craniocaudal gradient, and the greatest degree of honeycombing is in the upper lungs.     ECHO 11/03/20 - IMPRESSION:  1. Left ventricular ejection fraction, by estimation, is 60 to 65%. The  left ventricle has normal function. The left ventricle has no regional  wall motion abnormalities. Left ventricular diastolic parameters were  normal.  2. Marked cor pulmonale with flattened septum in both systole and  diastole. Right ventricular systolic function is severely reduced. The  right ventricular size is severely enlarged.     Renal US - R kidney 5.7 cm, atrophic and diffuse ^echo similar to prior CT. L kidney 9.5 com, no hydro or ^echo.     UA - not done   Assessment/ Plan: 1. AKI on CKD IV - b/l creat 2.2- 2.5 from 2021, eGFR 22-25 ml/min. Creat here 3.2 on admit, improved to 2.39, now back up to 3.42 today. Renal US shows one atrophic kidney, other kidney normal in appearance. UA pending.  Suspect AKI due to vol depletion. BUN/ Cr up today despite IVF"s. BP's soft, have added midodrine. Primary MD ordered a bolus. Cont IVF. No indication for RRT at this time. Would recommend GOC; as pt has other significant comorbidities this would be helpful.  2. ILD - IS fibrosis on CT scans back to 2019, on home O2  3. Cor pulmonale - due to bad lung disease/ COPD/ ILD 4. BP - bp's soft, not on any BP lowering meds at home.   5. COVID infeciton - per pmd   Rob Schertz 11/06/2020, 3:47 PM   Recent Labs  Lab 11/05/20 0425 11/06/20 0431  K 5.0 5.0  BUN 126* 151*  CREATININE 3.42* 3.72*  CALCIUM 7.8* 7.3*  PHOS 7.3* 6.2*  HGB 12.7 13.1   Inpatient medications: . Chlorhexidine Gluconate Cloth  6 each Topical Daily  . ferrous sulfate  325 mg  Oral TID  . heparin injection (subcutaneous)  5,000 Units Subcutaneous Q8H  . insulin aspart  0-9 Units Subcutaneous TID WC  . Ipratropium-Albuterol  1 puff Inhalation Q6H  . mouth rinse  15 mL Mouth Rinse BID  . pantoprazole  40 mg Oral Daily  . predniSONE  40 mg Oral Q breakfast   Followed by  . [START ON 11/08/2020] predniSONE  30 mg Oral Q breakfast   Followed by  . [START ON 10/28/2020] predniSONE  20 mg Oral Q breakfast   Followed by  . [START ON 11/12/2020] predniSONE  10 mg Oral Q breakfast   . sodium chloride 85 mL/hr at 11/06/20 1019   acetaminophen, guaiFENesin-dextromethorphan, linaclotide, ondansetron (ZOFRAN) IV, oxyCODONE       

## 2020-11-07 ENCOUNTER — Inpatient Hospital Stay (HOSPITAL_COMMUNITY): Payer: Medicare Other

## 2020-11-07 DIAGNOSIS — J9621 Acute and chronic respiratory failure with hypoxia: Secondary | ICD-10-CM | POA: Diagnosis not present

## 2020-11-07 LAB — CBC WITH DIFFERENTIAL/PLATELET
Abs Immature Granulocytes: 0.12 10*3/uL — ABNORMAL HIGH (ref 0.00–0.07)
Abs Immature Granulocytes: 0.19 10*3/uL — ABNORMAL HIGH (ref 0.00–0.07)
Basophils Absolute: 0 10*3/uL (ref 0.0–0.1)
Basophils Absolute: 0 10*3/uL (ref 0.0–0.1)
Basophils Relative: 0 %
Basophils Relative: 0 %
Eosinophils Absolute: 0 10*3/uL (ref 0.0–0.5)
Eosinophils Absolute: 0 10*3/uL (ref 0.0–0.5)
Eosinophils Relative: 0 %
Eosinophils Relative: 0 %
HCT: 37.4 % (ref 36.0–46.0)
HCT: 39.4 % (ref 36.0–46.0)
Hemoglobin: 12.4 g/dL (ref 12.0–15.0)
Hemoglobin: 12.9 g/dL (ref 12.0–15.0)
Immature Granulocytes: 2 %
Immature Granulocytes: 2 %
Lymphocytes Relative: 3 %
Lymphocytes Relative: 2 %
Lymphs Abs: 0.2 10*3/uL — ABNORMAL LOW (ref 0.7–4.0)
Lymphs Abs: 0.2 10*3/uL — ABNORMAL LOW (ref 0.7–4.0)
MCH: 28.2 pg (ref 26.0–34.0)
MCH: 28.3 pg (ref 26.0–34.0)
MCHC: 33.2 g/dL (ref 30.0–36.0)
MCHC: 32.7 g/dL (ref 30.0–36.0)
MCV: 85.2 fL (ref 80.0–100.0)
MCV: 86.4 fL (ref 80.0–100.0)
Monocytes Absolute: 0.6 10*3/uL (ref 0.1–1.0)
Monocytes Absolute: 0.8 10*3/uL (ref 0.1–1.0)
Monocytes Relative: 9 %
Monocytes Relative: 9 %
Neutro Abs: 5.5 10*3/uL (ref 1.7–7.7)
Neutro Abs: 8.4 10*3/uL — ABNORMAL HIGH (ref 1.7–7.7)
Neutrophils Relative %: 86 %
Neutrophils Relative %: 87 %
Platelets: 78 10*3/uL — ABNORMAL LOW (ref 150–400)
Platelets: 72 10*3/uL — ABNORMAL LOW (ref 150–400)
RBC: 4.39 MIL/uL (ref 3.87–5.11)
RBC: 4.56 MIL/uL (ref 3.87–5.11)
RDW: 15.3 % (ref 11.5–15.5)
RDW: 15.7 % — ABNORMAL HIGH (ref 11.5–15.5)
WBC: 6.4 10*3/uL (ref 4.0–10.5)
WBC: 9.7 10*3/uL (ref 4.0–10.5)
nRBC: 0.8 % — ABNORMAL HIGH (ref 0.0–0.2)
nRBC: 0.7 % — ABNORMAL HIGH (ref 0.0–0.2)

## 2020-11-07 LAB — URINALYSIS, ROUTINE W REFLEX MICROSCOPIC
Bilirubin Urine: NEGATIVE
Glucose, UA: NEGATIVE mg/dL
Hgb urine dipstick: NEGATIVE
Ketones, ur: 5 mg/dL — AB
Leukocytes,Ua: NEGATIVE
Nitrite: NEGATIVE
Protein, ur: NEGATIVE mg/dL
Specific Gravity, Urine: 1.014 (ref 1.005–1.030)
pH: 5 (ref 5.0–8.0)

## 2020-11-07 LAB — GLUCOSE, CAPILLARY
Glucose-Capillary: 92 mg/dL (ref 70–99)
Glucose-Capillary: 144 mg/dL — ABNORMAL HIGH (ref 70–99)
Glucose-Capillary: 159 mg/dL — ABNORMAL HIGH (ref 70–99)
Glucose-Capillary: 108 mg/dL — ABNORMAL HIGH (ref 70–99)
Glucose-Capillary: 121 mg/dL — ABNORMAL HIGH (ref 70–99)

## 2020-11-07 LAB — COMPREHENSIVE METABOLIC PANEL
ALT: 247 U/L — ABNORMAL HIGH (ref 0–44)
AST: 189 U/L — ABNORMAL HIGH (ref 15–41)
Albumin: 3.1 g/dL — ABNORMAL LOW (ref 3.5–5.0)
Alkaline Phosphatase: 73 U/L (ref 38–126)
Anion gap: 19 — ABNORMAL HIGH (ref 5–15)
BUN: 133 mg/dL — ABNORMAL HIGH (ref 8–23)
CO2: 23 mmol/L (ref 22–32)
Calcium: 7.1 mg/dL — ABNORMAL LOW (ref 8.9–10.3)
Chloride: 102 mmol/L (ref 98–111)
Creatinine, Ser: 3.36 mg/dL — ABNORMAL HIGH (ref 0.44–1.00)
GFR, Estimated: 14 mL/min — ABNORMAL LOW (ref >60)
Glucose, Bld: 93 mg/dL (ref 70–99)
Potassium: 4.6 mmol/L (ref 3.5–5.1)
Sodium: 144 mmol/L (ref 135–145)
Total Bilirubin: 2.2 mg/dL — ABNORMAL HIGH (ref 0.3–1.2)
Total Protein: 5.5 g/dL — ABNORMAL LOW (ref 6.5–8.1)

## 2020-11-07 LAB — SODIUM, URINE, RANDOM: Sodium, Ur: 29 mmol/L

## 2020-11-07 LAB — PHOSPHORUS: Phosphorus: 5.3 mg/dL — ABNORMAL HIGH (ref 2.5–4.6)

## 2020-11-07 LAB — MAGNESIUM: Magnesium: 2.1 mg/dL (ref 1.7–2.4)

## 2020-11-07 LAB — D-DIMER, QUANTITATIVE: D-Dimer, Quant: 1.2 ug/mL-FEU — ABNORMAL HIGH (ref 0.00–0.50)

## 2020-11-07 LAB — FERRITIN: Ferritin: 200 ng/mL (ref 11–307)

## 2020-11-07 LAB — CREATININE, URINE, RANDOM: Creatinine, Urine: 82.13 mg/dL

## 2020-11-07 LAB — C-REACTIVE PROTEIN: CRP: 1.8 mg/dL — ABNORMAL HIGH (ref <1.0)

## 2020-11-07 MED ORDER — SODIUM CHLORIDE 0.45 % IV SOLN: INTRAVENOUS | Status: DC

## 2020-11-07 NOTE — Plan of Care (Signed)
°  Problem: Education: Goal: Knowledge of General Education information will improve Description: Including pain rating scale, medication(s)/side effects and non-pharmacologic comfort measures Outcome: Progressing   Problem: Health Behavior/Discharge Planning: Goal: Ability to manage health-related needs will improve Outcome: Progressing   Problem: Clinical Measurements: Goal: Ability to maintain clinical measurements within normal limits will improve Outcome: Progressing Goal: Will remain free from infection Outcome: Progressing Goal: Diagnostic test results will improve Outcome: Progressing Goal: Respiratory complications will improve Outcome: Progressing Goal: Cardiovascular complication will be avoided Outcome: Progressing   Problem: Activity: Goal: Risk for activity intolerance will decrease Outcome: Progressing   Problem: Nutrition: Goal: Adequate nutrition will be maintained Outcome: Progressing   Problem: Coping: Goal: Level of anxiety will decrease Outcome: Progressing   Problem: Elimination: Goal: Will not experience complications related to bowel motility Outcome: Progressing Goal: Will not experience complications related to urinary retention Outcome: Progressing   Problem: Pain Managment: Goal: General experience of comfort will improve Outcome: Progressing   Problem: Safety: Goal: Ability to remain free from injury will improve Outcome: Progressing   Problem: Skin Integrity: Goal: Risk for impaired skin integrity will decrease Outcome: Progressing   Problem: Education: Goal: Knowledge of risk factors and measures for prevention of condition will improve Outcome: Progressing   Problem: Coping: Goal: Psychosocial and spiritual needs will be supported Outcome: Progressing   Problem: Respiratory: Goal: Will maintain a patent airway Outcome: Progressing Goal: Complications related to the disease process, condition or treatment will be avoided or  minimized Outcome: Progressing   Problem: Education: Goal: Knowledge of disease or condition will improve Outcome: Progressing Goal: Knowledge of the prescribed therapeutic regimen will improve Outcome: Progressing Goal: Individualized Educational Video(s) Outcome: Progressing   Problem: Activity: Goal: Ability to tolerate increased activity will improve Outcome: Progressing Goal: Will verbalize the importance of balancing activity with adequate rest periods Outcome: Progressing   Problem: Respiratory: Goal: Ability to maintain a clear airway will improve Outcome: Progressing Goal: Levels of oxygenation will improve Outcome: Progressing Goal: Ability to maintain adequate ventilation will improve Outcome: Progressing

## 2020-11-07 NOTE — Progress Notes (Signed)
PROGRESS NOTE    Erica [name]  [id number] DOB: June 09, 1951 DOA: 11/05/2020 PCP: [facility]   Chief Complaint  Patient presents with  . Shortness of Breath    Brief Narrative:  Erica [name] is Wyat Infinger 70 year old female with past medical history significant for COPD/chronic hypoxic respiratory failure on 4 L nasal cannula at baseline, interstitial lung disease, type 2 diabetes mellitus, chronic kidney disease stage IV, chronic diastolic congestive heart failure who presented to the ED with progressive shortness of breath. Patient also with associated dizziness, cough, increased work of breathing. Patient denies any productive cough/phlegm, no fever/chills, no recent weight gain or lower extremity edema. Patient reports vaccinated against Covid-19, but has not yet received Micheil Klaus booster dose.  She was admitted for acute on chronic hypoxic respiratory failure in the setting of COVID 19 pneumonia, COPD exacerbation, heart failure, in addition to superimposed bacterial pneumonia.   Assessment & Plan:   Principal Problem:   Acute on chronic respiratory failure (HCC) Active Problems:   COPD exacerbation (HCC)   Hypertension   Diabetes mellitus type 2, controlled ([place])   (HFpEF) heart failure with preserved ejection fraction (HCC)   Acute renal failure superimposed on stage 4 chronic kidney disease (HCC)   COPD with acute exacerbation (HCC)   Hyperkalemia   Acute diastolic (congestive) heart failure (HCC)   Anemia due to stage 4 chronic kidney disease (HCC)   Cigarette smoker   Pneumonia due to COVID-19 virus  Acute on Chronic Hypoxic Respiratory Failure 2/2 COVID 19 Infection (baseline 3-4 L Erica [name]) Patient is vaccinated against Covid-19 but has not received booster dose. --COVID test: + 11/15/2020 --requiring ~6-7 L HFNC this AM - wean as tolerated (baseline 3-4 L per her report, some discrepancies in chart, but she reports 4 L) --VQ scan without evidence of  PE --CXR 2/9 with mild infiltrate at R lung base, decrease in pulm interstitial edema - difficult to exclude small lateral pneumothorax -> CT chest 2/9 without evidence of pneumothorax.  Calcified pleural plaques on R and pleural thickening.  Area of spiculation at the lung base in the R lower lobe (increased in appearance from 2019, but relatively stable from recent CT abdomen/pelvis).  Consider PET CT after discharge.  Chronic interstitial thickening and fibrosis similar to prior exam.  Mild ascites. -- CXR 2/13 without edema or airspace opacity --Completed 5-day course of remdesivir on 11/01/2020 --taper steroids --actemra/baricitinib avoided due to ?hx diverticulitis and concern for bacterial pneumonia  --Continue supportive care with albuterol MDI prn, vitamin C, zinc, Tylenol, antitussives (benzonatate/ Mucinex/Tussionex) --Follow CBC, CMP, D-dimer, and CRP daily --Continue airborne/contact isolation precautions  --Prone as able, IS, flutter, OOB, therapy  COVID-19 Labs  Recent Labs    11/06/20 0431 11/07/20 0408  DDIMER 0.88* 1.20*  FERRITIN 243 200  CRP 1.5* 1.8*    Lab Results  Component Value Date   SARSCOV2NAA POSITIVE (Wilmar Prabhakar) 11/15/2020   [place] NEGATIVE 03/27/2020   [place] NEGATIVE 02/18/2020   [place] NEGATIVE 05/21/2019   HFpEF with Exacerbation  Pulmonary Hypertension Continued dependent edema on exam, improved oxygenation Takes 40 mg torsemide daily at home Holding diuretics with AKI Elevated BNP, unclear significance in CKD below, but appears overloaded - improving Echo pending  Acute renal failure on CKD stage IV  Hypernatremia Creatinine baseline 2.3 - 2.45.  Creatinine peaked at 3.72 (from overdiuresis) Improved today Continue IVF  Renal US - R kidney with increased echogenicity and atrophy, no lesion or hydro to L kidney,  partially distended bladder --Hold home lisinopril --Strict I's and O's and daily weights --renal c/s, appreciate  recs  Elevated LFT's Possibly 2/2 relative hypotension?  Follow. Follow acute hepatitis panel Follow RUQ [place]  Community acquired pneumonia, POA Chest x-ray with right lung base infiltrates (as above) --Completed azithro/ceftriaxone --Continue supplemental oxygen as above  Area of spiculation at the lung base in the right lower lobe adjacent to the diaphragm which measures at least 12 x 10 mm  Needs outpatient follow up  Acute on chronic respiratory failure, POA COPD exacerbation Interstitial lung disease --s/p azithromycin --Continue Solu-Medrol as above --Combivent 1 puff inh q6h  Lactic acidosis, POA Resolved  Type 2 diabetes mellitus Hemoglobin A1c 5.3, well-controlled. Diet controlled at home. SSI  Essential hypertension On amlodipine 10 mg p.o. daily, lisinopril 2.5 mg p.o. daily at home. --midodrine per renal  GERD: Continue PPI  Iron deficiency anemia: Continue ferrous sulfate '325mg'$  PO TID  Thrombocytopenia Noted at presentation.  Possibly 2/2 covid, fluctuating, worsened today.  4T score 3.  Will continue to monitor for now.  Follow smear.  DVT prophylaxis: heparin  Code Status: full  Family Communication: none at bedside - called sister, 2/12 Disposition:   Status is: Inpatient  Remains inpatient appropriate because:Inpatient level of care appropriate due to severity of illness   Dispo:  Patient From: Home  Planned Disposition: Home with Health Care Svc  Expected discharge date: 11/07/2020  Medically stable for discharge: No       Consultants:   none  Procedures: Echo pending  Antimicrobials: Anti-infectives (From admission, onward)   Start     Dose/Rate Route Frequency Ordered Stop   10/30/20 2200  azithromycin (ZITHROMAX) tablet 500 mg  Status:  Discontinued        500 mg Oral Daily at bedtime 10/30/20 1103 11/02/20 1105   10/30/20 1700  vancomycin (VANCOREADY) IVPB 500 mg/100 mL  Status:  Discontinued        500 mg 100 mL/hr over  60 Minutes Intravenous Every 48 hours 11/19/2020 1608 10/29/2020 2003   10/29/20 1600  ceFEPIme (MAXIPIME) 2 g in sodium chloride 0.9 % 100 mL IVPB  Status:  Discontinued        2 g 200 mL/hr over 30 Minutes Intravenous Every 24 hours 11/20/2020 1606 11/02/2020 2003   10/29/20 1000  remdesivir 100 mg in sodium chloride 0.9 % 100 mL IVPB  Status:  Discontinued       "Followed by" Linked Group Details   100 mg 200 mL/hr over 30 Minutes Intravenous Daily 11/16/2020 2036 11/06/2020 2048   10/29/20 1000  remdesivir 100 mg in sodium chloride 0.9 % 100 mL IVPB        100 mg 200 mL/hr over 30 Minutes Intravenous Daily 11/21/2020 1819 11/01/20 1109   10/29/20 1000  cefTRIAXone (ROCEPHIN) 1 g in sodium chloride 0.9 % 100 mL IVPB  Status:  Discontinued        1 g 200 mL/hr over 30 Minutes Intravenous Every 24 hours 11/04/2020 2003 11/03/20 1437   11/01/2020 2030  azithromycin (ZITHROMAX) 500 mg in sodium chloride 0.9 % 250 mL IVPB  Status:  Discontinued        500 mg 250 mL/hr over 60 Minutes Intravenous Every 24 hours 11/06/2020 2003 10/30/20 1103   11/13/2020 1848  remdesivir 100 mg in sodium chloride 0.9 % 100 mL IVPB       "Followed by" Linked Group Details   100 mg 200 mL/hr over 30 Minutes Intravenous  Once 11/14/2020 1819 11/03/2020 2217   11/15/2020 1830  remdesivir 200 mg in sodium chloride 0.9% 250 mL IVPB  Status:  Discontinued       "Followed by" Linked Group Details   200 mg 580 mL/hr over 30 Minutes Intravenous Once 11/08/2020 2036 10/29/2020 2048   10/26/2020 1830  remdesivir 100 mg in sodium chloride 0.9 % 100 mL IVPB       "Followed by" Linked Group Details   100 mg 200 mL/hr over 30 Minutes Intravenous  Once 11/19/2020 1819 11/18/2020 2128   11/08/2020 1630  vancomycin (VANCOREADY) IVPB 1250 mg/250 mL  Status:  Discontinued        1,250 mg 166.7 mL/hr over 90 Minutes Intravenous  Once 11/21/2020 1605 11/20/2020 2003   11/22/2020 1600  ceFEPIme (MAXIPIME) 2 g in sodium chloride 0.9 % 100 mL IVPB        2 g 200 mL/hr over  30 Minutes Intravenous  Once 11/04/2020 1557 11/08/2020 1750   11/07/2020 1600  metroNIDAZOLE (FLAGYL) IVPB 500 mg        500 mg 100 mL/hr over 60 Minutes Intravenous  Once 11/08/2020 1557 11/17/2020 2022   11/02/2020 1600  vancomycin (VANCOCIN) IVPB 1000 mg/200 mL premix  Status:  Discontinued        1,000 mg 200 mL/hr over 60 Minutes Intravenous  Once 11/01/2020 1557 11/11/2020 1605         Subjective: No appetite Asking about when she'll be able to go home  Objective: Vitals:   11/06/20 1322 11/06/20 2101 11/07/20 0500 11/07/20 0546  BP:  117/84  (!) 105/59  Pulse:  89  87  Resp:  20  18  Temp:  97.8 F (36.6 C)  97.7 F (36.5 C)  TempSrc:  Oral  Oral  SpO2: 95% 90%  94%  Weight:   57.4 kg   Height:        Intake/Output Summary (Last 24 hours) at 11/07/2020 1024 Last data filed at 11/07/2020 0600 Gross per 24 hour  Intake 2403.94 ml  Output 875 ml  Net 1528.94 ml   Filed Weights   11/04/20 0503 11/05/20 0538 11/07/20 0500  Weight: 55 kg 57.4 kg 57.4 kg    Examination:  General: No acute distress. Chronically ill appearing. Cardiovascular: Heart sounds show Kadence Mikkelson regular rate, and rhythm. Lungs: bibasilar crackles Abdomen: Soft, nontender, nondistended  Neurological: Alert and oriented 3. Moves all extremities 4. Cranial nerves II through XII grossly intact. Skin: Warm and dry. No rashes or lesions. Extremities: No clubbing or cyanosis. No edema.   Data Reviewed: I have personally reviewed following labs and imaging studies  CBC: Recent Labs  Lab 11/02/20 0431 11/03/20 0431 11/04/20 0425 11/05/20 0425 11/06/20 0431 11/07/20 0408  WBC 6.4 6.5 5.6 6.7 6.5 6.4  NEUTROABS 5.8  --  5.0 5.9 5.6 5.5  HGB 12.2 12.4 12.3 12.7 13.1 12.4  HCT 36.8 37.7 37.9 38.3 39.5 37.4  MCV 85.2 86.1 86.7 84.7 84.9 85.2  PLT 126* 134* 113* 119* 105* 78*    Basic Metabolic Panel: Recent Labs  Lab 11/03/20 0431 11/04/20 0425 11/05/20 0425 11/06/20 0431 11/07/20 0408  NA 143 146*  141 140 144  K 4.5 4.7 5.0 5.0 4.6  CL 96* 98 94* 95* 102  CO2 '28 25 29 24 23  '$ GLUCOSE 116* 86 162* 174* 93  BUN 114* 122* 126* 151* 133*  CREATININE 3.02* 3.18* 3.42* 3.72* 3.36*  CALCIUM 7.9* 7.8* 7.8* 7.3* 7.1*  MG 2.1 2.3  2.0 2.2 2.1  PHOS  --  6.3* 7.3* 6.2* 5.3*    GFR: Estimated Creatinine Clearance: 13.3 mL/min (Atiya Yera) (by C-G formula based on SCr of 3.36 mg/dL (H)).  Liver Function Tests: Recent Labs  Lab 11/03/20 0431 11/04/20 0425 11/05/20 0425 11/06/20 0431 11/07/20 0408  AST '22 24 20 '$ 135* 189*  ALT 32 30 32 111* 247*  ALKPHOS 84 83 82 80 73  BILITOT 1.1 1.3* 1.8* 1.8* 2.2*  PROT 6.1* 5.8* 6.2* 5.9* 5.5*  ALBUMIN 3.5 3.3* 3.4* 3.3* 3.1*    CBG: Recent Labs  Lab 11/06/20 0801 11/06/20 1106 11/06/20 1709 11/06/20 2059 11/07/20 0836  GLUCAP 148* 140* 143* 94 92     Recent Results (from the past 240 hour(s))  SARS Coronavirus 2 by RT PCR (hospital order, performed in [facility] hospital lab) Nasopharyngeal Nasopharyngeal Swab     Status: Abnormal   Collection Time: 11/09/2020  2:24 PM   Specimen: Nasopharyngeal Swab  Result Value Ref Range Status   SARS Coronavirus 2 POSITIVE (Emberlyn Burlison) NEGATIVE Final    Comment: RESULT CALLED TO, READ BACK BY AND VERIFIED WITH: BELTON,C ON 10/27/2020 AT 1650 BY LOY,C (NOTE) SARS-CoV-2 target nucleic acids are DETECTED  SARS-CoV-2 RNA is generally detectable in upper respiratory specimens  during the acute phase of infection.  Positive results are indicative  of the presence of the identified virus, but do not rule out bacterial infection or co-infection with other pathogens not detected by the test.  Clinical correlation with patient history and  other diagnostic information is necessary to determine patient infection status.  The expected result is negative.  Fact Sheet for Patients:   StrictlyIdeas.no   Fact Sheet for Healthcare Providers:   BankingDealers.co.za    This test  is not yet approved or cleared by the [place] FDA and  has been authorized for detection and/or diagnosis of SARS-CoV-2 by FDA under an Emergency Use Authorization (EUA).  This EUA will remain in effect (meaning this t est can be used) for the duration of  the COVID-19 declaration under Section 564(b)(1) of the Act, 21 U.S.C. section 360-bbb-3(b)(1), unless the authorization is terminated or revoked sooner.  Performed at [facility], [address] [place], [place] [address]   Blood Culture (routine x 2)     Status: None   Collection Time: 10/29/2020  4:44 PM   Specimen: BLOOD LEFT ARM  Result Value Ref Range Status   Specimen Description BLOOD LEFT ARM  Final   Special Requests   Final    BOTTLES DRAWN AEROBIC AND ANAEROBIC Blood Culture adequate volume   Culture   Final    NO GROWTH 5 DAYS Performed at [facility], [address], [place], [place] [address]    Report Status 11/02/2020 FINAL  Final  Blood Culture (routine x 2)     Status: None   Collection Time: 11/02/2020  4:44 PM   Specimen: BLOOD RIGHT FOREARM  Result Value Ref Range Status   Specimen Description BLOOD RIGHT FOREARM  Final   Special Requests   Final    BOTTLES DRAWN AEROBIC AND ANAEROBIC Blood Culture adequate volume   Culture   Final    NO GROWTH 5 DAYS Performed at [facility], [address] [place], [place] [address]    Report Status 11/02/2020 FINAL  Final  MRSA PCR Screening     Status: None   Collection Time: 10/29/20 12:53 AM   Specimen: Nasopharyngeal  Result Value Ref Range Status  MRSA by PCR NEGATIVE NEGATIVE Final    Comment:        The GeneXpert MRSA Assay (FDA approved for NASAL specimens only), is one component of Amaria Mundorf comprehensive MRSA colonization surveillance program. It is not intended to diagnose MRSA infection nor to guide or monitor treatment for MRSA infections. Performed at [facility], [place] [address], [place], [place] [address]           Radiology Studies: DG CHEST PORT 1 VIEW  Result Date: 11/07/2020 CLINICAL DATA:  Respiratory failure.  Reported COVID-19 positive EXAM: PORTABLE CHEST 1 VIEW COMPARISON:  Chest radiograph November 06, 2020; chest CT November 03, 2020 FINDINGS: Pleural calcification on the right is again noted. There is scarring in the right base. Scattered areas of fibrosis in the right mid and lower lung regions is stable. There is no appreciable edema or consolidation. Heart remains enlarged. The pulmonary vascularity is normal. No adenopathy. There is aortic atherosclerosis. No evident bone lesions. IMPRESSION: Areas of scarring/fibrosis on the right. Pleural calcification raises question of previous asbestos exposure. No edema or airspace opacity. Stable cardiomegaly. Aortic Atherosclerosis (ICD10-I70.0). Comment: Spiculated lesion right lower lung region seen on recent CT not appreciable by radiography. Electronically Signed   By: Lowella Grip III M.D.   On: 11/07/2020 10:15   DG CHEST PORT 1 VIEW  Result Date: 11/06/2020 CLINICAL DATA:  Shortness of breath.  COVID-19 positive EXAM: PORTABLE CHEST 1 VIEW COMPARISON:  Chest radiograph and chest CT November 03, 2020. Chest radiograph Feb 07, 2020 FINDINGS: There is pleural calcification on the right which may be indicative of previous asbestos exposure. There is chronic blunting of the right costophrenic angle consistent with scarring. There is mild scarring elsewhere in each lung base. There is fibrotic change in the right mid and lower lung regions, stable. There is no appreciable edema or consolidation. Heart is enlarged with pulmonary vascularity normal. No adenopathy. There is aortic atherosclerosis. No bone lesions. IMPRESSION: Pleural calcification on the right. Question previous asbestos exposure. Areas of fibrotic type change, primarily in the right lower lung region. Chronic blunting of the right costophrenic angle. Scarring in the bases. No edema  or airspace opacity. Note that Paiden Cavell somewhat spiculated area in the right lower lung region seen on recent CT is not appreciable by radiography. Stable cardiomegaly. No evident adenopathy. Aortic Atherosclerosis (ICD10-I70.0). Electronically Signed   By: Lowella Grip III M.D.   On: 11/06/2020 14:48        Scheduled Meds: . Chlorhexidine Gluconate Cloth  6 each Topical Daily  . ferrous sulfate  325 mg Oral TID  . heparin injection (subcutaneous)  5,000 Units Subcutaneous Q8H  . insulin aspart  0-9 Units Subcutaneous TID WC  . Ipratropium-Albuterol  1 puff Inhalation Q6H  . mouth rinse  15 mL Mouth Rinse BID  . midodrine  10 mg Oral TID WC  . pantoprazole  40 mg Oral Daily  . [START ON 11/08/2020] predniSONE  30 mg Oral Q breakfast   Followed by  . [START ON 25-Nov-2020] predniSONE  20 mg Oral Q breakfast   Followed by  . [START ON 11/12/2020] predniSONE  10 mg Oral Q breakfast   Continuous Infusions: . sodium chloride 85 mL/hr at 11/07/20 0538     LOS: 10 days    Time spent: over 30 min    Fayrene Helper, MD [facility]   To contact the attending provider between 7A-7P or the covering provider during after hours 7P-7A, please log  into the web site [email or web address] and access using universal [place] password for that web site. If you do not have the password, please call the hospital operator.  11/07/2020, 10:24 AM

## 2020-11-07 NOTE — Progress Notes (Addendum)
[facility] Progress Note  Subjective:  Erica [name] seen in room. No c/o's, feels "better", appetite better. BUN/ creat down today to 133/ 3.36.     Vitals:   11/06/20 2101 11/07/20 0500 11/07/20 0546 11/07/20 1234  BP: 117/84  (!) 105/59 (!) 109/58  Pulse: 89  87 90  Resp: '20  18 18  ' Temp: 97.8 F (36.6 C)  97.7 F (36.5 C) 97.6 F (36.4 C)  TempSrc: Oral  Oral Oral  SpO2: 90%  94% (!) 85%  Weight:  57.4 kg    Height:        Exam:  Gen alert, no distress, nasal O2 No jvd or bruits Chest poor air movement bilat, no rales or wheezing RRR no MRG Abd soft ntnd no mass or ascites +bs Ext no edema Neuro is alert, Ox 3 , nf    Home meds:  - demedex 40 qd  - pepcid 10  - stiolto respimat  - prn's/ vitamins/ supplements     Date                          Creat               eGFR   2008                          0.81   2009- 2018                1.10- 1.70   2019                          1.60- 2.40        AKI 4.4, AKI 2.8   2020                          2.40- 3.32        15- 22 ml/min     2021                          2.23- 2.51        22- 25 ml/min   Oct 28, 2020               3.28                 22   Feb 6                         2.39   Feb 9                         3.02                 16   Feb 10                       3.18                 15   Feb 11                       3.42                 14      ECHO 11/03/20 - IMPRESSION:  1. Left  ventricular ejection fraction, by estimation, is 60 to 65% 2. Marked cor pulmonale with flattened septum in both systole and  diastole. Right ventricular systolic function is severely reduced. The  right ventricular size is severely enlarged.     Renal US - R kidney 5.7 cm, atrophic and diffuse ^echo similar to prior CT. L kidney 9.5 com, no hydro or ^echo.     UA - not done yet   Assessment/ Plan: 1. AKI on CKD IV - b/l creat 2.2- 2.5 from 2021, eGFR 22-25 ml/min. Creat here 2.9 on admit 2/03, peaked at 3.72 yest and down to  3.3 today. BUN down 133 today. No uremic symptoms. Renal US showed one atrophic kidney, other kidney normal in appearance. UA pending.  Suspect AKI due to vol depletion/ overdiuresis, solitary kidney, RHF w/ hypotension/ hypoperfusion. Creat better today w/ IVF's and added midodrine. BP's a bit better. Erica [name] has significant ILD which mimics CHF on CXR. Would avoid any ARB/ ACEi for this patient w/ severe RHF, increases risk for AKI. For now will cont IVF"s, change to 1/2 NS, cont to hold diuretics, cont midodrine.  2. ILD - IS fibrosis on CT scans back to 2019, on home O2  3. Cor pulmonale - due to bad lung disease/ COPD/ ILD 4. Hypotension - as above, started midodrine  5. COVID infeciton - per pmd 6. GOC - d/w patient, seemed to indicate no heroic measures, will ask primary MD to confirm.    Erica [name] 11/07/2020, 1:43 PM   Recent Labs  Lab 11/06/20 0431 11/07/20 0408  K 5.0 4.6  BUN 151* 133*  CREATININE 3.72* 3.36*  CALCIUM 7.3* 7.1*  PHOS 6.2* 5.3*  HGB 13.1 12.4   Inpatient medications: . Chlorhexidine Gluconate Cloth  6 each Topical Daily  . ferrous sulfate  325 mg Oral TID  . heparin injection (subcutaneous)  5,000 Units Subcutaneous Q8H  . insulin aspart  0-9 Units Subcutaneous TID WC  . Ipratropium-Albuterol  1 puff Inhalation Q6H  . mouth rinse  15 mL Mouth Rinse BID  . midodrine  10 mg Oral TID WC  . pantoprazole  40 mg Oral Daily  . [START ON 11/08/2020] predniSONE  30 mg Oral Q breakfast   Followed by  . [START ON 2020-11-13] predniSONE  20 mg Oral Q breakfast   Followed by  . [START ON 11/12/2020] predniSONE  10 mg Oral Q breakfast   . sodium chloride 85 mL/hr at 11/07/20 0538   acetaminophen, guaiFENesin-dextromethorphan, linaclotide, ondansetron (ZOFRAN) IV, oxyCODONE

## 2020-11-08 ENCOUNTER — Inpatient Hospital Stay (HOSPITAL_COMMUNITY): Payer: Medicare Other

## 2020-11-08 DIAGNOSIS — J9621 Acute and chronic respiratory failure with hypoxia: Secondary | ICD-10-CM | POA: Diagnosis not present

## 2020-11-08 LAB — C-REACTIVE PROTEIN: CRP: 1.7 mg/dL — ABNORMAL HIGH (ref <1.0)

## 2020-11-08 LAB — RENAL FUNCTION PANEL
Albumin: 3.1 g/dL — ABNORMAL LOW (ref 3.5–5.0)
Anion gap: 18 — ABNORMAL HIGH (ref 5–15)
BUN: 143 mg/dL — ABNORMAL HIGH (ref 8–23)
CO2: 21 mmol/L — ABNORMAL LOW (ref 22–32)
Calcium: 7.2 mg/dL — ABNORMAL LOW (ref 8.9–10.3)
Chloride: 102 mmol/L (ref 98–111)
Creatinine, Ser: 3.54 mg/dL — ABNORMAL HIGH (ref 0.44–1.00)
GFR, Estimated: 13 mL/min — ABNORMAL LOW (ref >60)
Glucose, Bld: 88 mg/dL (ref 70–99)
Phosphorus: 5.7 mg/dL — ABNORMAL HIGH (ref 2.5–4.6)
Potassium: 5.1 mmol/L (ref 3.5–5.1)
Sodium: 141 mmol/L (ref 135–145)

## 2020-11-08 LAB — CBC WITH DIFFERENTIAL/PLATELET
Abs Immature Granulocytes: 0.17 10*3/uL — ABNORMAL HIGH (ref 0.00–0.07)
Basophils Absolute: 0 10*3/uL (ref 0.0–0.1)
Basophils Relative: 0 %
Eosinophils Absolute: 0 10*3/uL (ref 0.0–0.5)
Eosinophils Relative: 0 %
HCT: 40.7 % (ref 36.0–46.0)
Hemoglobin: 13.3 g/dL (ref 12.0–15.0)
Immature Granulocytes: 2 %
Lymphocytes Relative: 3 %
Lymphs Abs: 0.3 10*3/uL — ABNORMAL LOW (ref 0.7–4.0)
MCH: 28.3 pg (ref 26.0–34.0)
MCHC: 32.7 g/dL (ref 30.0–36.0)
MCV: 86.6 fL (ref 80.0–100.0)
Monocytes Absolute: 1 10*3/uL (ref 0.1–1.0)
Monocytes Relative: 10 %
Neutro Abs: 8.6 10*3/uL — ABNORMAL HIGH (ref 1.7–7.7)
Neutrophils Relative %: 85 %
Platelets: 63 10*3/uL — ABNORMAL LOW (ref 150–400)
RBC: 4.7 MIL/uL (ref 3.87–5.11)
RDW: 15.7 % — ABNORMAL HIGH (ref 11.5–15.5)
WBC: 10 10*3/uL (ref 4.0–10.5)
nRBC: 0.8 % — ABNORMAL HIGH (ref 0.0–0.2)

## 2020-11-08 LAB — GLUCOSE, CAPILLARY
Glucose-Capillary: 106 mg/dL — ABNORMAL HIGH (ref 70–99)
Glucose-Capillary: 97 mg/dL (ref 70–99)
Glucose-Capillary: 125 mg/dL — ABNORMAL HIGH (ref 70–99)
Glucose-Capillary: 111 mg/dL — ABNORMAL HIGH (ref 70–99)

## 2020-11-08 LAB — COMPREHENSIVE METABOLIC PANEL
ALT: 279 U/L — ABNORMAL HIGH (ref 0–44)
AST: 225 U/L — ABNORMAL HIGH (ref 15–41)
Albumin: 3.1 g/dL — ABNORMAL LOW (ref 3.5–5.0)
Alkaline Phosphatase: 86 U/L (ref 38–126)
Anion gap: 19 — ABNORMAL HIGH (ref 5–15)
BUN: 112 mg/dL — ABNORMAL HIGH (ref 8–23)
CO2: 21 mmol/L — ABNORMAL LOW (ref 22–32)
Calcium: 7.2 mg/dL — ABNORMAL LOW (ref 8.9–10.3)
Chloride: 102 mmol/L (ref 98–111)
Creatinine, Ser: 3.49 mg/dL — ABNORMAL HIGH (ref 0.44–1.00)
GFR, Estimated: 13 mL/min — ABNORMAL LOW (ref >60)
Glucose, Bld: 88 mg/dL (ref 70–99)
Potassium: 5.1 mmol/L (ref 3.5–5.1)
Sodium: 142 mmol/L (ref 135–145)
Total Bilirubin: 2.7 mg/dL — ABNORMAL HIGH (ref 0.3–1.2)
Total Protein: 5.6 g/dL — ABNORMAL LOW (ref 6.5–8.1)

## 2020-11-08 LAB — PHOSPHORUS: Phosphorus: 5.6 mg/dL — ABNORMAL HIGH (ref 2.5–4.6)

## 2020-11-08 LAB — MAGNESIUM: Magnesium: 2.2 mg/dL (ref 1.7–2.4)

## 2020-11-08 LAB — D-DIMER, QUANTITATIVE: D-Dimer, Quant: 1.58 ug/mL-FEU — ABNORMAL HIGH (ref 0.00–0.50)

## 2020-11-08 LAB — PATHOLOGIST SMEAR REVIEW

## 2020-11-08 MED ORDER — ALBUMIN HUMAN 25 % IV SOLN
25.0000 g | Freq: Once | INTRAVENOUS | Status: AC
  Administered 2020-11-08: 25 g via INTRAVENOUS
  Filled 2020-11-08: qty 100

## 2020-11-08 NOTE — Progress Notes (Signed)
Patient ID: Erica [name], female   DOB: 02-May-1951, 70 y.o.   MRN: [id number] [facility] Progress Note   Assessment/ Plan:   1. Acute kidney Injury on chronic kidney disease stage IV (baseline creatinine 2.2-2.5): Imaging shows relative right renal atrophy and possibly a solitary functioning left kidney with previous work-up suggestive of intravascular volume contraction from aggressive diuresis in the setting of right heart failure.  She is getting intravenous fluids and midodrine with seeming worsening versus unchanged renal function today-I will give her 25 g of intravenous albumin today.  We had a brief discussion regarding her acute kidney injury and she informs me that she would want to undertake dialysis if indicated (even if chronic). 2.  Interstitial lung disease: With interstitial lung disease dating back to 2019 and chronic hypoxic respiratory failure for which she is on oxygen supplementation via nasal cannula. 3.  Cor pulmonale: Secondary to ILD and consequent right heart failure-continue efforts at volume optimization. 4.  Hypotension: On gentle intravenous fluids as well as midodrine for blood pressure support; will give  Subjective:   Reports to be feeling tired at this time, denies any chest pain and without shortness of breath on oxygen supplementation.   Objective:   BP 98/61 (BP Location: Right Arm)   Pulse 90   Temp 97.6 F (36.4 C) (Oral)   Resp (!) 22   Ht '5\' 4"'$  (1.626 m)   Wt 58.1 kg Comment: bedscale  SpO2 94%   BMI 21.97 kg/m   Intake/Output Summary (Last 24 hours) at 11/08/2020 1417 Last data filed at 11/08/2020 1145 Gross per 24 hour  Intake 1936.75 ml  Output 650 ml  Net 1286.75 ml   Weight change: 0.68 kg  Physical Exam: Gen: Chronically ill-appearing, limited verbal engagement but appropriate CVS: Pulse regular rhythm, normal rate, S1 with loud S2 Resp: Fine rales bilaterally without any wheeze or rhonchi Abd: Soft, flat, nontender,  bowel sounds normal Ext: No lower extremity edema  Imaging: DG CHEST PORT 1 VIEW  Result Date: 11/08/2020 CLINICAL DATA:  Shortness of breath, COVID-19. EXAM: PORTABLE CHEST 1 VIEW COMPARISON:  11/07/2020 and CT chest 11/03/2020. FINDINGS: Patient is rotated. Trachea is midline. Heart is enlarged. Pleuroparenchymal scarring and calcification in the lateral and inferior right hemithorax. Lungs are hyperinflated without dense airspace consolidation or pleural fluid. IMPRESSION: 1. No acute findings. 2. Pleuroparenchymal scarring in the right hemithorax. 3. Basilar nodule seen in the right lower lobe on 11/03/2020 is not well appreciated. Electronically Signed   By: Lorin Picket M.D.   On: 11/08/2020 14:07   DG CHEST PORT 1 VIEW  Result Date: 11/07/2020 CLINICAL DATA:  Respiratory failure.  Reported COVID-19 positive EXAM: PORTABLE CHEST 1 VIEW COMPARISON:  Chest radiograph November 06, 2020; chest CT November 03, 2020 FINDINGS: Pleural calcification on the right is again noted. There is scarring in the right base. Scattered areas of fibrosis in the right mid and lower lung regions is stable. There is no appreciable edema or consolidation. Heart remains enlarged. The pulmonary vascularity is normal. No adenopathy. There is aortic atherosclerosis. No evident bone lesions. IMPRESSION: Areas of scarring/fibrosis on the right. Pleural calcification raises question of previous asbestos exposure. No edema or airspace opacity. Stable cardiomegaly. Aortic Atherosclerosis (ICD10-I70.0). Comment: Spiculated lesion right lower lung region seen on recent CT not appreciable by radiography. Electronically Signed   By: Lowella Grip III M.D.   On: 11/07/2020 10:15   US Abdomen Limited RUQ (LIVER/GB)  Result Date: 11/07/2020 CLINICAL  DATA:  Elevated bilirubin.  COVID-19 infection. EXAM: ULTRASOUND ABDOMEN LIMITED RIGHT UPPER QUADRANT COMPARISON:  CT, 11/11/2020 FINDINGS: Gallbladder: Numerous stones with posterior  acoustic shadowing limiting the assessment of the gallbladder. No wall thickening. No sonographic Burnley's sign. Common bile duct: Diameter: 6 mm Liver: Normal size. Coarsened echotexture. Nodular anterior contour. No masses. Portal vein is patent on color Doppler imaging with normal direction of blood flow towards the liver. Other: Small amount of ascites. IMPRESSION: 1. Numerous gallstones, but no evidence of acute cholecystitis. Common bile duct normal in caliber for age. 2. Sonographic appearance of the liver suggests cirrhosis. No liver masses. 3. Small amount of ascites. Electronically Signed   By: Lajean Manes M.D.   On: 11/07/2020 20:55    Labs: BMET Recent Labs  Lab 11/02/20 0431 11/03/20 0431 11/04/20 0425 11/05/20 0425 11/06/20 0431 11/07/20 0408 11/08/20 0148  NA 140 143 146* 141 140 144 141  142  K 4.4 4.5 4.7 5.0 5.0 4.6 5.1  5.1  CL 95* 96* 98 94* 95* 102 102  102  CO2 '28 28 25 29 24 23 '$ 21*  21*  GLUCOSE 110* 116* 86 162* 174* 93 88  88  BUN 111* 114* 122* 126* 151* 133* 143*  112*  CREATININE 3.06* 3.02* 3.18* 3.42* 3.72* 3.36* 3.54*  3.49*  CALCIUM 7.9* 7.9* 7.8* 7.8* 7.3* 7.1* 7.2*  7.2*  PHOS  --   --  6.3* 7.3* 6.2* 5.3* 5.7*  5.6*   CBC Recent Labs  Lab 11/06/20 0431 11/07/20 0408 11/07/20 1707 11/08/20 0148  WBC 6.5 6.4 9.7 10.0  NEUTROABS 5.6 5.5 8.4* 8.6*  HGB 13.1 12.4 12.9 13.3  HCT 39.5 37.4 39.4 40.7  MCV 84.9 85.2 86.4 86.6  PLT 105* 78* 72* 63*    Medications:    . Chlorhexidine Gluconate Cloth  6 each Topical Daily  . ferrous sulfate  325 mg Oral TID  . heparin injection (subcutaneous)  5,000 Units Subcutaneous Q8H  . insulin aspart  0-9 Units Subcutaneous TID WC  . Ipratropium-Albuterol  1 puff Inhalation Q6H  . mouth rinse  15 mL Mouth Rinse BID  . midodrine  10 mg Oral TID WC  . pantoprazole  40 mg Oral Daily  . predniSONE  30 mg Oral Q breakfast   Followed by  . [START ON Nov 30, 2020] predniSONE  20 mg Oral Q breakfast    Followed by  . [START ON 11/12/2020] predniSONE  10 mg Oral Q breakfast   Elmarie Shiley, MD 11/08/2020, 2:17 PM

## 2020-11-08 NOTE — Progress Notes (Signed)
PROGRESS NOTE    Erica [name]  [id number] DOB: July 24, 1951 DOA: 10/30/2020 PCP: [facility]   Chief Complaint  Patient presents with  . Shortness of Breath    Brief Narrative:  Erica [name] is Erica [name] 70 year old female with past medical history significant for COPD/chronic hypoxic respiratory failure on 4 L nasal cannula at baseline, interstitial lung disease, type 2 diabetes mellitus, chronic kidney disease stage IV, chronic diastolic congestive heart failure who presented to the ED with progressive shortness of breath. Patient also with associated dizziness, cough, increased work of breathing. Patient denies any productive cough/phlegm, no fever/chills, no recent weight gain or lower extremity edema. Patient reports vaccinated against Covid-19, but has not yet received [name].  She was admitted for acute on chronic hypoxic respiratory failure in the setting of COVID 19 pneumonia, COPD exacerbation, heart failure, in addition to superimposed bacterial pneumonia.   Assessment & Plan:   Principal Problem:   Acute on chronic respiratory failure (HCC) Active Problems:   COPD exacerbation (HCC)   Hypertension   Diabetes mellitus type 2, controlled ([place])   (HFpEF) heart failure with preserved ejection fraction (HCC)   Acute renal failure superimposed on stage 4 chronic kidney disease (HCC)   COPD with acute exacerbation (HCC)   Hyperkalemia   Acute diastolic (congestive) heart failure (HCC)   Anemia due to stage 4 chronic kidney disease (HCC)   Cigarette smoker   Pneumonia due to COVID-19 virus  Acute on Chronic Hypoxic Respiratory Failure 2/2 COVID 19 Infection (baseline 3-4 L [place]) Patient is vaccinated against Covid-19 but has not received booster dose. --COVID test: + 11/05/2020 --requiring ~8 L HFNC this AM - increasing - wean as tolerated (baseline 3-4 L per her report, some discrepancies in chart, but she reports 4 L) --VQ scan without  evidence of PE --CXR 2/9 with mild infiltrate at R lung base, decrease in pulm interstitial edema - difficult to exclude small lateral pneumothorax -> CT chest 2/9 without evidence of pneumothorax.  Calcified pleural plaques on R and pleural thickening.  Area of spiculation at the lung base in the R lower lobe (increased in appearance from 2019, but relatively stable from recent CT abdomen/pelvis).  Consider PET CT after discharge.  Chronic interstitial thickening and fibrosis similar to prior exam.  Mild ascites. -- CXR 2/13 without edema or airspace opacity -- CXR 2/12 without acute findings --Completed 5-day course of remdesivir on 11/01/2020 --taper steroids --actemra/baricitinib avoided due to ?hx diverticulitis and concern for bacterial pneumonia  --Continue supportive care with albuterol MDI prn, vitamin C, zinc, Tylenol, antitussives (benzonatate/ Mucinex/Tussionex) --Follow CBC, CMP, D-dimer, and CRP daily --Continue airborne/contact isolation precautions  --Prone as able, IS, flutter, OOB, therapy  COVID-19 Labs  Recent Labs    11/06/20 0431 11/07/20 0408 11/08/20 0148  DDIMER 0.88* 1.20* 1.58*  FERRITIN 243 200  --   CRP 1.5* 1.8* 1.7*    Lab Results  Component Value Date   SARSCOV2NAA POSITIVE ([facility]) 11/09/2020   SARSCOV2NAA NEGATIVE 03/27/2020   [facility] NEGATIVE 02/18/2020   [facility] NEGATIVE 05/21/2019   Acute renal failure on CKD stage IV  Hypernatremia Creatinine baseline 2.3 - 2.45.  Creatinine peaked at 3.72 (from overdiuresis) Relatively stable today Continue IVF  Renal US - R kidney with increased echogenicity and atrophy, no lesion or hydro to L kidney, partially distended bladder --Hold home lisinopril --Strict I's and O's and daily weights --renal c/s, appreciate recs  HFpEF with Exacerbation  Pulmonary  Hypertension Continued dependent edema on exam, improved oxygenation Takes 40 mg torsemide daily at home Holding diuretics with AKI Elevated  BNP, unclear significance in CKD below, but appears overloaded - improving Echo pending  Elevated LFT's  Concern for Cirrhosis Possibly 2/2 relative hypotension?  Follow. Follow acute hepatitis panel (negative) Follow RUQ [place] (gallstones, sonographic appearance of liver suggest cirrhosis, small amt of ascites) Continue to monitor  Community acquired pneumonia, POA Chest x-ray with right lung base infiltrates (as above) --Completed azithro/ceftriaxone --Continue supplemental oxygen as above  Area of spiculation at the lung base in the right lower lobe adjacent to the diaphragm which measures at least 12 x 10 mm  Needs outpatient follow up  Acute on chronic respiratory failure, POA COPD exacerbation Interstitial lung disease --s/p azithromycin --Continue Solu-Medrol as above --Combivent 1 puff inh q6h  Lactic acidosis, POA Resolved  Type 2 diabetes mellitus Hemoglobin A1c 5.3, well-controlled. Diet controlled at home. SSI  Essential hypertension On amlodipine 10 mg p.o. daily, lisinopril 2.5 mg p.o. daily at home. --midodrine per renal  GERD: Continue PPI  Iron deficiency anemia: Continue ferrous sulfate '325mg'$  PO TID  Thrombocytopenia Noted at presentation.  Possibly 2/2 covid, fluctuating, worsening today - also with likely cirrhosis.  4T score 3.  Hold heparin.  Will continue to monitor for now.  Follow smear (anisocytosis, neutrophilia, thrombocytopenia).  DVT prophylaxis: heparin  Code Status: full - confirms desire for full code today Family Communication: none at bedside - called sister, 2/12 Disposition:   Status is: Inpatient  Remains inpatient appropriate because:Inpatient level of care appropriate due to severity of illness   Dispo:  Patient From: Home  Planned Disposition: Home with Health Care Svc  Expected discharge date: 11/09/2020  Medically stable for discharge: No       Consultants:   none  Procedures: Echo  pending  Antimicrobials: Anti-infectives (From admission, onward)   Start     Dose/Rate Route Frequency Ordered Stop   10/30/20 2200  azithromycin (ZITHROMAX) tablet 500 mg  Status:  Discontinued        500 mg Oral Daily at bedtime 10/30/20 1103 11/02/20 1105   10/30/20 1700  vancomycin (VANCOREADY) IVPB 500 mg/100 mL  Status:  Discontinued        500 mg 100 mL/hr over 60 Minutes Intravenous Every 48 hours 11/08/2020 1608 10/30/2020 2003   10/29/20 1600  ceFEPIme (MAXIPIME) 2 g in sodium chloride 0.9 % 100 mL IVPB  Status:  Discontinued        2 g 200 mL/hr over 30 Minutes Intravenous Every 24 hours 11/05/2020 1606 10/30/2020 2003   10/29/20 1000  remdesivir 100 mg in sodium chloride 0.9 % 100 mL IVPB  Status:  Discontinued       "Followed by" Linked Group Details   100 mg 200 mL/hr over 30 Minutes Intravenous Daily 11/05/2020 2036 10/31/2020 2048   10/29/20 1000  remdesivir 100 mg in sodium chloride 0.9 % 100 mL IVPB        100 mg 200 mL/hr over 30 Minutes Intravenous Daily 11/09/2020 1819 11/01/20 1109   10/29/20 1000  cefTRIAXone (ROCEPHIN) 1 g in sodium chloride 0.9 % 100 mL IVPB  Status:  Discontinued        1 g 200 mL/hr over 30 Minutes Intravenous Every 24 hours 10/27/2020 2003 11/03/20 1437   11/18/2020 2030  azithromycin (ZITHROMAX) 500 mg in sodium chloride 0.9 % 250 mL IVPB  Status:  Discontinued        500  mg 250 mL/hr over 60 Minutes Intravenous Every 24 hours 11/06/2020 2003 10/30/20 1103   11/11/2020 1848  remdesivir 100 mg in sodium chloride 0.9 % 100 mL IVPB       "Followed by" Linked Group Details   100 mg 200 mL/hr over 30 Minutes Intravenous  Once 11/20/2020 1819 11/08/2020 2217   11/19/2020 1830  remdesivir 200 mg in sodium chloride 0.9% 250 mL IVPB  Status:  Discontinued       "Followed by" Linked Group Details   200 mg 580 mL/hr over 30 Minutes Intravenous Once 11/22/2020 2036 11/02/2020 2048   11/16/2020 1830  remdesivir 100 mg in sodium chloride 0.9 % 100 mL IVPB       "Followed by" Linked  Group Details   100 mg 200 mL/hr over 30 Minutes Intravenous  Once 11/22/2020 1819 10/29/2020 2128   11/22/2020 1630  vancomycin (VANCOREADY) IVPB 1250 mg/250 mL  Status:  Discontinued        1,250 mg 166.7 mL/hr over 90 Minutes Intravenous  Once 10/30/2020 1605 11/16/2020 2003   11/12/2020 1600  ceFEPIme (MAXIPIME) 2 g in sodium chloride 0.9 % 100 mL IVPB        2 g 200 mL/hr over 30 Minutes Intravenous  Once 11/14/2020 1557 11/17/2020 1750   10/27/2020 1600  metroNIDAZOLE (FLAGYL) IVPB 500 mg        500 mg 100 mL/hr over 60 Minutes Intravenous  Once 11/05/2020 1557 11/06/2020 2022   11/02/2020 1600  vancomycin (VANCOCIN) IVPB 1000 mg/200 mL premix  Status:  Discontinued        1,000 mg 200 mL/hr over 60 Minutes Intravenous  Once 11/21/2020 1557 11/16/2020 1605         Subjective: No new complaints today  Objective: Vitals:   11/08/20 0500 11/08/20 1149 11/08/20 1300 11/08/20 1537  BP:  98/61 110/65 132/67  Pulse:  90 88 85  Resp:  (!) '22 17 20  '$ Temp:  (!) 97.5 F (36.4 C) (!) 97.4 F (36.3 C)   TempSrc:  Axillary    SpO2:  94% 90% 100%  Weight: 58.1 kg     Height:        Intake/Output Summary (Last 24 hours) at 11/08/2020 1659 Last data filed at 11/08/2020 1145 Gross per 24 hour  Intake 1228.78 ml  Output 450 ml  Net 778.78 ml   Filed Weights   11/05/20 0538 11/07/20 0500 11/08/20 0500  Weight: 57.4 kg 57.4 kg 58.1 kg    Examination:  General: No acute distress. Cardiovascular: Heart sounds show Clarrisa Kaylor regular rate, and rhythm.  Lungs: bibasilar crackles, faint Abdomen: Soft, nontender, nondistended  Neurological: Alert and oriented 3. Moves all extremities 4 . Cranial nerves II through XII grossly intact. Skin: Warm and dry. No rashes or lesions. Extremities: No clubbing or cyanosis. No edema. .    Data Reviewed: I have personally reviewed following labs and imaging studies  CBC: Recent Labs  Lab 11/05/20 0425 11/06/20 0431 11/07/20 0408 11/07/20 1707 11/08/20 0148  WBC 6.7  6.5 6.4 9.7 10.0  NEUTROABS 5.9 5.6 5.5 8.4* 8.6*  HGB 12.7 13.1 12.4 12.9 13.3  HCT 38.3 39.5 37.4 39.4 40.7  MCV 84.7 84.9 85.2 86.4 86.6  PLT 119* 105* 78* 72* 63*    Basic Metabolic Panel: Recent Labs  Lab 11/04/20 0425 11/05/20 0425 11/06/20 0431 11/07/20 0408 11/08/20 0148  NA 146* 141 140 144 141  142  K 4.7 5.0 5.0 4.6 5.1  5.1  CL 98  94* 95* 102 102  102  CO2 '25 29 24 23 '$ 21*  21*  GLUCOSE 86 162* 174* 93 88  88  BUN 122* 126* 151* 133* 143*  112*  CREATININE 3.18* 3.42* 3.72* 3.36* 3.54*  3.49*  CALCIUM 7.8* 7.8* 7.3* 7.1* 7.2*  7.2*  MG 2.3 2.0 2.2 2.1 2.2  PHOS 6.3* 7.3* 6.2* 5.3* 5.7*  5.6*    GFR: Estimated Creatinine Clearance: 12.6 mL/min (Miette Molenda) (by C-G formula based on SCr of 3.54 mg/dL (H)).  Liver Function Tests: Recent Labs  Lab 11/04/20 0425 11/05/20 0425 11/06/20 0431 11/07/20 0408 11/08/20 0148  AST 24 20 135* 189* 225*  ALT 30 32 111* 247* 279*  ALKPHOS 83 82 80 73 86  BILITOT 1.3* 1.8* 1.8* 2.2* 2.7*  PROT 5.8* 6.2* 5.9* 5.5* 5.6*  ALBUMIN 3.3* 3.4* 3.3* 3.1* 3.1*  3.1*    CBG: Recent Labs  Lab 11/07/20 1302 11/07/20 1821 11/07/20 2014 11/08/20 0728 11/08/20 1144  GLUCAP 144* 108* 121* 106* 97     No results found for this or any previous visit (from the past 240 hour(s)).       Radiology Studies: DG CHEST PORT 1 VIEW  Result Date: 11/08/2020 CLINICAL DATA:  Shortness of breath, COVID-19. EXAM: PORTABLE CHEST 1 VIEW COMPARISON:  11/07/2020 and CT chest 11/03/2020. FINDINGS: Patient is rotated. Trachea is midline. Heart is enlarged. Pleuroparenchymal scarring and calcification in the lateral and inferior right hemithorax. Lungs are hyperinflated without dense airspace consolidation or pleural fluid. IMPRESSION: 1. No acute findings. 2. Pleuroparenchymal scarring in the right hemithorax. 3. Basilar nodule seen in the right lower lobe on 11/03/2020 is not well appreciated. Electronically Signed   By: Lorin Picket M.D.    On: 11/08/2020 14:07   DG CHEST PORT 1 VIEW  Result Date: 11/07/2020 CLINICAL DATA:  Respiratory failure.  Reported COVID-19 positive EXAM: PORTABLE CHEST 1 VIEW COMPARISON:  Chest radiograph November 06, 2020; chest CT November 03, 2020 FINDINGS: Pleural calcification on the right is again noted. There is scarring in the right base. Scattered areas of fibrosis in the right mid and lower lung regions is stable. There is no appreciable edema or consolidation. Heart remains enlarged. The pulmonary vascularity is normal. No adenopathy. There is aortic atherosclerosis. No evident bone lesions. IMPRESSION: Areas of scarring/fibrosis on the right. Pleural calcification raises question of previous asbestos exposure. No edema or airspace opacity. Stable cardiomegaly. Aortic Atherosclerosis (ICD10-I70.0). Comment: Spiculated lesion right lower lung region seen on recent CT not appreciable by radiography. Electronically Signed   By: Lowella Grip III M.D.   On: 11/07/2020 10:15   US Abdomen Limited RUQ (LIVER/GB)  Result Date: 11/07/2020 CLINICAL DATA:  Elevated bilirubin.  COVID-19 infection. EXAM: ULTRASOUND ABDOMEN LIMITED RIGHT UPPER QUADRANT COMPARISON:  CT, 11/18/2020 FINDINGS: Gallbladder: Numerous stones with posterior acoustic shadowing limiting the assessment of the gallbladder. No wall thickening. No sonographic Yearsley's sign. Common bile duct: Diameter: 6 mm Liver: Normal size. Coarsened echotexture. Nodular anterior contour. No masses. Portal vein is patent on color Doppler imaging with normal direction of blood flow towards the liver. Other: Small amount of ascites. IMPRESSION: 1. Numerous gallstones, but no evidence of acute cholecystitis. Common bile duct normal in caliber for age. 2. Sonographic appearance of the liver suggests cirrhosis. No liver masses. 3. Small amount of ascites. Electronically Signed   By: Lajean Manes M.D.   On: 11/07/2020 20:55        Scheduled Meds: . Chlorhexidine  Gluconate Cloth  6 each Topical Daily  . ferrous sulfate  325 mg Oral TID  . heparin injection (subcutaneous)  5,000 Units Subcutaneous Q8H  . insulin aspart  0-9 Units Subcutaneous TID WC  . Ipratropium-Albuterol  1 puff Inhalation Q6H  . mouth rinse  15 mL Mouth Rinse BID  . midodrine  10 mg Oral TID WC  . pantoprazole  40 mg Oral Daily  . predniSONE  30 mg Oral Q breakfast   Followed by  . [START ON 2020/12/03] predniSONE  20 mg Oral Q breakfast   Followed by  . [START ON 11/12/2020] predniSONE  10 mg Oral Q breakfast   Continuous Infusions: . sodium chloride 75 mL/hr at 11/08/20 1547     LOS: 11 days    Time spent: over 30 min    Fayrene Helper, MD [facility]   To contact the attending provider between 7A-7P or the covering provider during after hours 7P-7A, please log into the web site [email or web address] and access using universal [place] password for that web site. If you do not have the password, please call the hospital operator.  11/08/2020, 4:59 PM

## 2020-11-08 NOTE — Progress Notes (Deleted)
Patient alert/oriented, denies any pain, in bed resting, C/O being tired and weak. Dr. Florene Glen made aware.   11/08/20 1149  Assess: MEWS Score  Temp (!) 97.5 F (36.4 C)  BP 98/61  Pulse Rate 90  Resp (!) 22  Level of Consciousness Alert  SpO2 94 %  Assess: MEWS Score  MEWS Temp 0  MEWS Systolic 1  MEWS Pulse 0  MEWS RR 1  MEWS LOC 0  MEWS Score 2  MEWS Score Color Yellow  Assess: if the MEWS score is Yellow or Red  Were vital signs taken at a resting state? Yes  Focused Assessment Change from prior assessment (see assessment flowsheet)  Early Detection of Sepsis Score *See Row Information* Low  MEWS guidelines implemented *See Row Information* Yes  Treat  MEWS Interventions Administered scheduled meds/treatments  Pain Scale 0-10  Pain Score 0  Notify: Charge Nurse/RN  Name of Charge Nurse/RN Notified Chapman Moss, RN  Date Charge Nurse/RN Notified 11/08/20

## 2020-11-08 NOTE — Care Management Important Message (Signed)
Important Message  Patient Details IM Letter placed in Patient's door [place]. Name: Erica [name] MRN: [id number] Date of Birth: [birth year]   Medicare Important Message Given:  Yes     Kerin Salen 11/08/2020, 12:10 PM

## 2020-11-08 NOTE — Plan of Care (Signed)
  Problem: Education: Goal: Knowledge of General Education information will improve Description: Including pain rating scale, medication(s)/side effects and non-pharmacologic comfort measures Outcome: Progressing   Problem: Health Behavior/Discharge Planning: Goal: Ability to manage health-related needs will improve Outcome: Progressing   Problem: Clinical Measurements: Goal: Ability to maintain clinical measurements within normal limits will improve Outcome: Progressing Goal: Will remain free from infection Outcome: Progressing Goal: Diagnostic test results will improve Outcome: Progressing Goal: Respiratory complications will improve Outcome: Progressing Goal: Cardiovascular complication will be avoided Outcome: Progressing   Problem: Activity: Goal: Risk for activity intolerance will decrease Outcome: Progressing   Problem: Nutrition: Goal: Adequate nutrition will be maintained Outcome: Progressing   Problem: Coping: Goal: Level of anxiety will decrease Outcome: Progressing   Problem: Elimination: Goal: Will not experience complications related to bowel motility Outcome: Progressing Goal: Will not experience complications related to urinary retention Outcome: Progressing   Problem: Pain Managment: Goal: General experience of comfort will improve Outcome: Progressing   Problem: Safety: Goal: Ability to remain free from injury will improve Outcome: Progressing   Problem: Skin Integrity: Goal: Risk for impaired skin integrity will decrease Outcome: Progressing   Problem: Education: Goal: Knowledge of risk factors and measures for prevention of condition will improve Outcome: Progressing   Problem: Coping: Goal: Psychosocial and spiritual needs will be supported Outcome: Progressing   Problem: Respiratory: Goal: Will maintain a patent airway Outcome: Progressing Goal: Complications related to the disease process, condition or treatment will be avoided or  minimized Outcome: Progressing   Problem: Education: Goal: Knowledge of disease or condition will improve Outcome: Progressing Goal: Knowledge of the prescribed therapeutic regimen will improve Outcome: Progressing Goal: Individualized Educational Video(s) Outcome: Progressing   Problem: Activity: Goal: Ability to tolerate increased activity will improve Outcome: Progressing Goal: Will verbalize the importance of balancing activity with adequate rest periods Outcome: Progressing   Problem: Respiratory: Goal: Ability to maintain a clear airway will improve Outcome: Progressing Goal: Levels of oxygenation will improve Outcome: Progressing Goal: Ability to maintain adequate ventilation will improve Outcome: Progressing

## 2020-11-08 NOTE — Progress Notes (Signed)
PT Cancellation Note  Patient Details Name: Erica [name] MRN: [id number] DOB: 03-18-1951   Cancelled Treatment:     Pt not feeling well today.  Was on 4 to 6 lts now 8 lts at 90% at rest in fetal position.  Pt has been evaluated with rec for [facility] PT.  Will check back another day.     Nathanial Rancher 11/08/2020, 1:16 PM

## 2020-11-08 NOTE — Progress Notes (Signed)
Patient alert/oriented, denies any pain, in bed resting, C/O being tired and weak. Dr. Florene Glen made aware.   11/08/20 1149  Assess: MEWS Score  Temp (!) 97.5 F (36.4 C)  BP 98/61  Pulse Rate 90  Resp (!) 22  Level of Consciousness Alert  SpO2 94 %  Assess: MEWS Score  MEWS Temp 0  MEWS Systolic 1  MEWS Pulse 0  MEWS RR 1  MEWS LOC 0  MEWS Score 2  MEWS Score Color Yellow  Assess: if the MEWS score is Yellow or Red  Were vital signs taken at a resting state? Yes  Focused Assessment Change from prior assessment (see assessment flowsheet)  Early Detection of Sepsis Score *See Row Information* Low  MEWS guidelines implemented *See Row Information* Yes  Treat  MEWS Interventions Administered scheduled meds/treatments  Pain Scale 0-10  Pain Score 0  Notify: Charge Nurse/RN  Name of Charge Nurse/RN Notified Chapman Moss, RN  Date Charge Nurse/RN Notified 11/08/20

## 2020-11-08 NOTE — Progress Notes (Signed)
Patient C/O more weakness today, unable to get to the [facility], oxygen saturation 87-88 on 7L HE/[place], increased her oxygen to 8L, decreased urine output noted, only  200cc out, bladder scan 77 in the morning -11AM and now 158, Dr. Florene Glen notified, will continue to assess patient.

## 2020-11-09 ENCOUNTER — Inpatient Hospital Stay (HOSPITAL_COMMUNITY): Payer: Medicare Other

## 2020-11-09 ENCOUNTER — Encounter (HOSPITAL_COMMUNITY): Payer: Self-pay | Admitting: Internal Medicine

## 2020-11-09 DIAGNOSIS — U071 COVID-19: Secondary | ICD-10-CM | POA: Diagnosis not present

## 2020-11-09 DIAGNOSIS — R7989 Other specified abnormal findings of blood chemistry: Secondary | ICD-10-CM

## 2020-11-09 DIAGNOSIS — J449 Chronic obstructive pulmonary disease, unspecified: Secondary | ICD-10-CM

## 2020-11-09 DIAGNOSIS — Z7189 Other specified counseling: Secondary | ICD-10-CM

## 2020-11-09 DIAGNOSIS — Z515 Encounter for palliative care: Secondary | ICD-10-CM

## 2020-11-09 DIAGNOSIS — J441 Chronic obstructive pulmonary disease with (acute) exacerbation: Secondary | ICD-10-CM | POA: Diagnosis not present

## 2020-11-09 DIAGNOSIS — D696 Thrombocytopenia, unspecified: Secondary | ICD-10-CM

## 2020-11-09 DIAGNOSIS — I5031 Acute diastolic (congestive) heart failure: Secondary | ICD-10-CM | POA: Diagnosis not present

## 2020-11-09 DIAGNOSIS — J9621 Acute and chronic respiratory failure with hypoxia: Secondary | ICD-10-CM | POA: Diagnosis not present

## 2020-11-09 DIAGNOSIS — Z66 Do not resuscitate: Secondary | ICD-10-CM

## 2020-11-09 LAB — CBC WITH DIFFERENTIAL/PLATELET
Abs Immature Granulocytes: 0.11 10*3/uL — ABNORMAL HIGH (ref 0.00–0.07)
Basophils Absolute: 0 10*3/uL (ref 0.0–0.1)
Basophils Relative: 0 %
Eosinophils Absolute: 0 10*3/uL (ref 0.0–0.5)
Eosinophils Relative: 0 %
HCT: 38.4 % (ref 36.0–46.0)
Hemoglobin: 12.9 g/dL (ref 12.0–15.0)
Immature Granulocytes: 1 %
Lymphocytes Relative: 3 %
Lymphs Abs: 0.2 10*3/uL — ABNORMAL LOW (ref 0.7–4.0)
MCH: 28.5 pg (ref 26.0–34.0)
MCHC: 33.6 g/dL (ref 30.0–36.0)
MCV: 85 fL (ref 80.0–100.0)
Monocytes Absolute: 0.9 10*3/uL (ref 0.1–1.0)
Monocytes Relative: 11 %
Neutro Abs: 7.1 10*3/uL (ref 1.7–7.7)
Neutrophils Relative %: 85 %
Platelets: 38 10*3/uL — ABNORMAL LOW (ref 150–400)
RBC: 4.52 MIL/uL (ref 3.87–5.11)
RDW: 15.6 % — ABNORMAL HIGH (ref 11.5–15.5)
WBC: 8.3 10*3/uL (ref 4.0–10.5)
nRBC: 1.8 % — ABNORMAL HIGH (ref 0.0–0.2)

## 2020-11-09 LAB — HIV ANTIBODY (ROUTINE TESTING W REFLEX): HIV Screen 4th Generation wRfx: NONREACTIVE

## 2020-11-09 LAB — HEPATIC FUNCTION PANEL
ALT: 354 U/L — ABNORMAL HIGH (ref 0–44)
AST: 259 U/L — ABNORMAL HIGH (ref 15–41)
Albumin: 3.8 g/dL (ref 3.5–5.0)
Alkaline Phosphatase: 78 U/L (ref 38–126)
Bilirubin, Direct: 2.2 mg/dL — ABNORMAL HIGH (ref 0.0–0.2)
Indirect Bilirubin: 1.7 mg/dL — ABNORMAL HIGH (ref 0.3–0.9)
Total Bilirubin: 3.9 mg/dL — ABNORMAL HIGH (ref 0.3–1.2)
Total Protein: 6 g/dL — ABNORMAL LOW (ref 6.5–8.1)

## 2020-11-09 LAB — COMPREHENSIVE METABOLIC PANEL
ALT: 321 U/L — ABNORMAL HIGH (ref 0–44)
AST: 247 U/L — ABNORMAL HIGH (ref 15–41)
Albumin: 3.5 g/dL (ref 3.5–5.0)
Alkaline Phosphatase: 63 U/L (ref 38–126)
Anion gap: 15 (ref 5–15)
BUN: 129 mg/dL — ABNORMAL HIGH (ref 8–23)
CO2: 20 mmol/L — ABNORMAL LOW (ref 22–32)
Calcium: 7.6 mg/dL — ABNORMAL LOW (ref 8.9–10.3)
Chloride: 103 mmol/L (ref 98–111)
Creatinine, Ser: 3.36 mg/dL — ABNORMAL HIGH (ref 0.44–1.00)
GFR, Estimated: 14 mL/min — ABNORMAL LOW (ref >60)
Glucose, Bld: 89 mg/dL (ref 70–99)
Potassium: 5.3 mmol/L — ABNORMAL HIGH (ref 3.5–5.1)
Sodium: 138 mmol/L (ref 135–145)
Total Bilirubin: 3 mg/dL — ABNORMAL HIGH (ref 0.3–1.2)
Total Protein: 5.5 g/dL — ABNORMAL LOW (ref 6.5–8.1)

## 2020-11-09 LAB — FOLATE: Folate: 13 ng/mL (ref >5.9)

## 2020-11-09 LAB — GLUCOSE, CAPILLARY
Glucose-Capillary: 82 mg/dL (ref 70–99)
Glucose-Capillary: 93 mg/dL (ref 70–99)
Glucose-Capillary: 114 mg/dL — ABNORMAL HIGH (ref 70–99)
Glucose-Capillary: 123 mg/dL — ABNORMAL HIGH (ref 70–99)

## 2020-11-09 LAB — PHOSPHORUS: Phosphorus: 5.6 mg/dL — ABNORMAL HIGH (ref 2.5–4.6)

## 2020-11-09 LAB — FERRITIN: Ferritin: 223 ng/mL (ref 11–307)

## 2020-11-09 LAB — VITAMIN B12: Vitamin B-12: 4554 pg/mL — ABNORMAL HIGH (ref 180–914)

## 2020-11-09 LAB — TSH: TSH: 3.181 u[IU]/mL (ref 0.350–4.500)

## 2020-11-09 LAB — C-REACTIVE PROTEIN: CRP: 1.6 mg/dL — ABNORMAL HIGH (ref <1.0)

## 2020-11-09 LAB — D-DIMER, QUANTITATIVE: D-Dimer, Quant: 2.01 ug/mL-FEU — ABNORMAL HIGH (ref 0.00–0.50)

## 2020-11-09 LAB — SAVE SMEAR(SSMR), FOR PROVIDER SLIDE REVIEW

## 2020-11-09 LAB — PROTIME-INR
INR: 2.6 — ABNORMAL HIGH (ref 0.8–1.2)
Prothrombin Time: 26.6 seconds — ABNORMAL HIGH (ref 11.4–15.2)

## 2020-11-09 LAB — MAGNESIUM: Magnesium: 2.2 mg/dL (ref 1.7–2.4)

## 2020-11-09 MED ORDER — ALBUMIN HUMAN 25 % IV SOLN
25.0000 g | Freq: Once | INTRAVENOUS | Status: AC
  Administered 2020-11-09: 25 g via INTRAVENOUS
  Filled 2020-11-09: qty 100

## 2020-11-09 MED ORDER — PATIROMER SORBITEX CALCIUM 8.4 G PO PACK
16.8000 g | PACK | Freq: Every day | ORAL | Status: AC
  Administered 2020-11-09: 16.8 g via ORAL
  Filled 2020-11-09 (×2): qty 2

## 2020-11-09 NOTE — Progress Notes (Signed)
Bilateral lower extremity venous duplex has been completed. Preliminary results can be found in CV Proc through chart review.   11/09/20 4:20 PM Erica [name] RVT

## 2020-11-09 NOTE — Plan of Care (Signed)
  Problem: Education: Goal: Knowledge of General Education information will improve Description: Including pain rating scale, medication(s)/side effects and non-pharmacologic comfort measures Outcome: Progressing   Problem: Health Behavior/Discharge Planning: Goal: Ability to manage health-related needs will improve Outcome: Progressing   Problem: Clinical Measurements: Goal: Ability to maintain clinical measurements within normal limits will improve Outcome: Progressing Goal: Will remain free from infection Outcome: Progressing Goal: Diagnostic test results will improve Outcome: Progressing Goal: Respiratory complications will improve Outcome: Progressing Goal: Cardiovascular complication will be avoided Outcome: Progressing   Problem: Activity: Goal: Risk for activity intolerance will decrease Outcome: Progressing   Problem: Nutrition: Goal: Adequate nutrition will be maintained Outcome: Progressing   Problem: Coping: Goal: Level of anxiety will decrease Outcome: Progressing   Problem: Elimination: Goal: Will not experience complications related to bowel motility Outcome: Progressing Goal: Will not experience complications related to urinary retention Outcome: Progressing   Problem: Pain Managment: Goal: General experience of comfort will improve Outcome: Progressing   Problem: Safety: Goal: Ability to remain free from injury will improve Outcome: Progressing   Problem: Skin Integrity: Goal: Risk for impaired skin integrity will decrease Outcome: Progressing   Problem: Education: Goal: Knowledge of risk factors and measures for prevention of condition will improve Outcome: Progressing   Problem: Coping: Goal: Psychosocial and spiritual needs will be supported Outcome: Progressing   Problem: Respiratory: Goal: Will maintain a patent airway Outcome: Progressing Goal: Complications related to the disease process, condition or treatment will be avoided or  minimized Outcome: Progressing   Problem: Education: Goal: Knowledge of disease or condition will improve Outcome: Progressing Goal: Knowledge of the prescribed therapeutic regimen will improve Outcome: Progressing Goal: Individualized Educational Video(s) Outcome: Progressing   Problem: Activity: Goal: Ability to tolerate increased activity will improve Outcome: Progressing Goal: Will verbalize the importance of balancing activity with adequate rest periods Outcome: Progressing   Problem: Respiratory: Goal: Ability to maintain a clear airway will improve Outcome: Progressing Goal: Levels of oxygenation will improve Outcome: Progressing Goal: Ability to maintain adequate ventilation will improve Outcome: Progressing

## 2020-11-09 NOTE — Progress Notes (Signed)
Occupational Therapy Treatment Patient Details Name: Erica [name] MRN: [id number] DOB: [birth year] Today's Date: 11/09/2020    History of present illness Pt is 70 year old female with past medical history significant for COPD/chronic hypoxic respiratory failure on 2 L nasal cannula at baseline, interstitial lung disease, type 2 diabetes mellitus, chronic kidney disease stage IV, chronic diastolic congestive heart failure who presented to the ED with progressive shortness of breath. Covid-19 + with PNA, CXR demonstrates cardiomegaly, vascular congestion and small bilateral pleural effusions with right lung base atelectasis versus developing infiltrate.   OT comments  Patient with functional decline this session compared to previous session with this OT last week. Patient with significant weakness needing max encouragement for OOB activity. Pt needing max A to power up to standing with bed height elevated then mod A to complete pivot to [facility] Pt mod A to stand while nursing staff provided total A for perianal care after incontinent of stool. Attempted use of rolling walker to stand from EOB prior to [facility] transfer however patient having difficulty keeping grip on walker therefore unsafe. Patient verbalizing wanting to go home however educated patient that she is very weak and needing increased assist for self care. D/C recommendations updated to reflect change in status for short term rehab prior to return home unless patient's family can provide current level of assist.   Follow Up Recommendations  SNF;Other (comment) (vs HH with 24/7 if family can provide level of A)    Equipment Recommendations  Tub/shower seat       Precautions / Restrictions Precautions Precautions: Fall Precaution Comments: monitor O2 sats, COPD on 4 L at baseline Restrictions Weight Bearing Restrictions: No       Mobility Bed Mobility Overal bed mobility: Needs Assistance Bed Mobility: Supine to Sit;Sit to Supine      Supine to sit: Mod assist;HOB elevated Sit to supine: Min assist   General bed mobility comments: needs max cues to participate and mod A for LE + trunk management with bed pad to sit upright. Patient self initiates laying down at end of session with min A to safely bring LEs to bed  Transfers Overall transfer level: Needs assistance Equipment used: None Transfers: Sit to/from [facility] Sit to Stand: Max assist Stand pivot transfers: Mod assist       General transfer comment: max A to power up to standing pt significantly weak, mod A once in standing to complete pivot transfer to/from [facility]    Balance Overall balance assessment: Needs assistance Sitting-balance support: Feet supported;Single extremity supported Sitting balance-Leahy Scale: Poor Sitting balance - Comments: leaning heavily on arms or leaning forward   Standing balance support: Bilateral upper extremity supported Standing balance-Leahy Scale: Poor Standing balance comment: needing mod A to stand with OT                           ADL either performed or assessed with clinical judgement   ADL Overall ADL's : Needs assistance/impaired                         Toilet Transfer: Maximal assistance;Moderate assistance;Stand-pivot;Cueing for safety;Cueing for sequencing;BSC Toilet Transfer Details (indicate cue type and reason): pt with significant weakness compared to previous session with this OT. pt needs max cues and max A to power up to standing then mod A to support weight and complete pivot transfer to<>from commode. Toileting- Clothing Manipulation and Hygiene: Total assistance;Sit  to/from stand Toileting - Water quality scientist Details (indicate cue type and reason): with mod A support in standing, RN completed patient's perianal care after bowel movement. tolerates standing with OT support for ~20 seconds.     Functional mobility during ADLs: Maximal assistance;Moderate  assistance;Cueing for safety;Cueing for sequencing General ADL Comments: patient with significant decline in overall strength, activity tolerance and ability to perform self care/functional transfers compared to last session with this OT. pt states she wants to go home but needs max encouragement to participation               Cognition Arousal/Alertness: Awake/alert Behavior During Therapy: Flat affect Overall Cognitive Status: No family/caregiver present to determine baseline cognitive functioning                                 General Comments: needing repetitive cues for participation/follow through              General Comments VSS on 10L O2    Pertinent Vitals/ Pain       Pain Assessment: Faces Faces Pain Scale: No hurt         Frequency  Min 2X/week        Progress Toward Goals  OT Goals(current goals can now be found in the care plan section)  Progress towards OT goals: Not progressing toward goals - comment (increased weakness)  Acute Rehab OT Goals Patient Stated Goal: get better and go home OT Goal Formulation: With patient Time For Goal Achievement: 11/15/20 Potential to Achieve Goals: Good ADL Goals Pt Will Perform Grooming: with modified independence;sitting;standing Pt Will Perform Upper Body Dressing: with modified independence;sitting Pt Will Perform Lower Body Dressing: with modified independence;sit to/from stand;sitting/lateral leans Pt Will Transfer to Toilet: with modified independence;ambulating;bedside commode Pt Will Perform Toileting - Clothing Manipulation and hygiene: with modified independence;sitting/lateral leans;sit to/from stand Additional ADL Goal #1: Patient will verbalize understanding of energy conservation techniques in order to implement at home to maximize safety and independence with daily routine.  Plan Discharge plan needs to be updated       AM-PAC OT "6 Clicks" Daily Activity     Outcome Measure    Help from another person eating meals?: A Little Help from another person taking care of personal grooming?: A Little Help from another person toileting, which includes using toliet, bedpan, or urinal?: A Lot Help from another person bathing (including washing, rinsing, drying)?: A Lot Help from another person to put on and taking off regular upper body clothing?: A Little Help from another person to put on and taking off regular lower body clothing?: A Lot 6 Click Score: 15    End of Session Equipment Utilized During Treatment: Gait belt;Oxygen  OT Visit Diagnosis: Other (comment) (COVID PNA)   Activity Tolerance Patient limited by fatigue   Patient Left in bed;with call bell/phone within reach;with bed alarm set;with nursing/sitter in room   Nurse Communication Mobility status        Time: [id number] OT Time Calculation (min): 42 min  Charges: OT General Charges $OT Visit: 1 Visit OT Treatments $Self Care/Home Management : 38-52 mins  Delbert Phenix OT OT pager: [phone]   Rosemary Holms 11/09/2020, 12:54 PM

## 2020-11-09 NOTE — TOC Progression Note (Signed)
Transition of Care [facility]) - Progression Note    Patient Details  Name: Erica [name] MRN: [id number] Date of Birth: 16-Jul-1951  Transition of Care [facility]) CM/SW Contact  Ross Ludwig, [place] Phone Number: 11/09/2020, 5:30 PM  Clinical Narrative:     Patient will need home health PT.  CSW contacted [facility], and they are able to accept patient once she is ready for discharge.  CSW to continue to follow patient's progress throughout discharge planning.       Expected Discharge Plan and Services    CSW continuing to follow patient's progress throughout discharge planning.                                             Social Determinants of Health (SDOH) Interventions    Readmission Risk Interventions Readmission Risk Prevention Plan 12/26/2018  Transportation Screening Complete  Medication Review Press photographer) Complete  PCP or Specialist appointment within 3-5 days of discharge Complete  HRI or [place] Complete  SW Recovery Care/Counseling Consult Complete  [place] Not Applicable  Some recent data might be hidden

## 2020-11-09 NOTE — Progress Notes (Addendum)
Patient ID: Erica [name], female   DOB: 1951-03-13, 70 y.o.   MRN: [id number] [facility] Progress Note   Assessment/ Plan:   1. Acute kidney Injury on chronic kidney disease stage IV (baseline creatinine 2.2-2.5): Imaging shows relative right renal atrophy and possibly a solitary functioning left kidney with work-up pointing at intravascular volume contraction from aggressive diuresis in the setting of right heart failure.  Creatinine plateaued versus slightly better at this time.  I will discontinue her intravenous fluids at this time and allow oral intake; urine output does not appear very promising and is marginally oliguric (accuracy of collection unclear).  She does not have any indications for dialysis at this time and informed me that she was open to the idea of pursuing this if needed.  Her elevated BUN is largely from ongoing corticosteroids. 2.  Interstitial lung disease: With interstitial lung disease dating back to 2019 and chronic hypoxic respiratory failure for which she is on oxygen supplementation via nasal cannula. 3.  Cor pulmonale: Secondary to ILD and consequent right heart failure-continue efforts at volume optimization. 4.  Hypotension: Blood pressures improving, continue midodrine and discontinue intravenous fluids at this time.  Subjective:   Denies any chest pain, shortness of breath, nausea, vomiting or dysgeusia.  Reports that she is tired of being in the hospital.   Objective:   BP 127/79 (BP Location: Right Arm)   Pulse 88   Temp (!) 97.5 F (36.4 C) (Axillary)   Resp (!) 22   Ht '5\' 4"'$  (1.626 m)   Wt 63.5 kg   SpO2 99%   BMI 24.03 kg/m   Intake/Output Summary (Last 24 hours) at 11/09/2020 1306 Last data filed at 11/09/2020 0600 Gross per 24 hour  Intake 1793.29 ml  Output 200 ml  Net 1593.29 ml   Weight change: 5.443 kg  Physical Exam: Gen: Chronically ill-appearing, flat affect and without notable involuntary movements CVS: Pulse  regular rhythm, normal rate, S1 with loud S2 Resp: Poor inspiratory effort with decreased breath sounds over bases Abd: Soft, flat, nontender, bowel sounds normal Ext: No lower extremity edema  Imaging: DG CHEST PORT 1 VIEW  Result Date: 11/08/2020 CLINICAL DATA:  Shortness of breath, COVID-19. EXAM: PORTABLE CHEST 1 VIEW COMPARISON:  11/07/2020 and CT chest 11/03/2020. FINDINGS: Patient is rotated. Trachea is midline. Heart is enlarged. Pleuroparenchymal scarring and calcification in the lateral and inferior right hemithorax. Lungs are hyperinflated without dense airspace consolidation or pleural fluid. IMPRESSION: 1. No acute findings. 2. Pleuroparenchymal scarring in the right hemithorax. 3. Basilar nodule seen in the right lower lobe on 11/03/2020 is not well appreciated. Electronically Signed   By: Lorin Picket M.D.   On: 11/08/2020 14:07   US Abdomen Limited RUQ (LIVER/GB)  Result Date: 11/07/2020 CLINICAL DATA:  Elevated bilirubin.  COVID-19 infection. EXAM: ULTRASOUND ABDOMEN LIMITED RIGHT UPPER QUADRANT COMPARISON:  CT, 11/19/2020 FINDINGS: Gallbladder: Numerous stones with posterior acoustic shadowing limiting the assessment of the gallbladder. No wall thickening. No sonographic Eakins's sign. Common bile duct: Diameter: 6 mm Liver: Normal size. Coarsened echotexture. Nodular anterior contour. No masses. Portal vein is patent on color Doppler imaging with normal direction of blood flow towards the liver. Other: Small amount of ascites. IMPRESSION: 1. Numerous gallstones, but no evidence of acute cholecystitis. Common bile duct normal in caliber for age. 2. Sonographic appearance of the liver suggests cirrhosis. No liver masses. 3. Small amount of ascites. Electronically Signed   By: Lajean Manes M.D.   On:  11/07/2020 20:55    Labs: BMET Recent Labs  Lab 11/03/20 0431 11/04/20 0425 11/05/20 0425 11/06/20 0431 11/07/20 0408 11/08/20 0148 11/09/20 0352  NA 143 146* 141 140 144  141  142 138  K 4.5 4.7 5.0 5.0 4.6 5.1  5.1 5.3*  CL 96* 98 94* 95* 102 102  102 103  CO2 '28 25 29 24 23 '$ 21*  21* 20*  GLUCOSE 116* 86 162* 174* 93 88  88 89  BUN 114* 122* 126* 151* 133* 143*  112* 129*  CREATININE 3.02* 3.18* 3.42* 3.72* 3.36* 3.54*  3.49* 3.36*  CALCIUM 7.9* 7.8* 7.8* 7.3* 7.1* 7.2*  7.2* 7.6*  PHOS  --  6.3* 7.3* 6.2* 5.3* 5.7*  5.6* 5.6*   CBC Recent Labs  Lab 11/07/20 0408 11/07/20 1707 11/08/20 0148 11/09/20 0352  WBC 6.4 9.7 10.0 8.3  NEUTROABS 5.5 8.4* 8.6* 7.1  HGB 12.4 12.9 13.3 12.9  HCT 37.4 39.4 40.7 38.4  MCV 85.2 86.4 86.6 85.0  PLT 78* 72* 63* 38*    Medications:    . Chlorhexidine Gluconate Cloth  6 each Topical Daily  . ferrous sulfate  325 mg Oral TID  . insulin aspart  0-9 Units Subcutaneous TID WC  . Ipratropium-Albuterol  1 puff Inhalation Q6H  . mouth rinse  15 mL Mouth Rinse BID  . midodrine  10 mg Oral TID WC  . pantoprazole  40 mg Oral Daily  . patiromer  16.8 g Oral Daily  . [START ON 12/04/20] predniSONE  20 mg Oral Q breakfast   Followed by  . [START ON 11/12/2020] predniSONE  10 mg Oral Q breakfast   Elmarie Shiley, MD 11/09/2020, 1:06 PM

## 2020-11-09 NOTE — Plan of Care (Signed)
  Problem: Clinical Measurements: Goal: Ability to maintain clinical measurements within normal limits will improve Outcome: Not Progressing Goal: Will remain free from infection Outcome: Not Progressing Goal: Respiratory complications will improve Outcome: Not Progressing   Problem: Activity: Goal: Risk for activity intolerance will decrease Outcome: Not Progressing   Problem: Nutrition: Goal: Adequate nutrition will be maintained Outcome: Not Progressing   Problem: Coping: Goal: Level of anxiety will decrease Outcome: Progressing   Problem: Elimination: Goal: Will not experience complications related to bowel motility Outcome: Not Progressing Goal: Will not experience complications related to urinary retention Outcome: Progressing   Problem: Pain Managment: Goal: General experience of comfort will improve Outcome: Progressing   Problem: Safety: Goal: Ability to remain free from injury will improve Outcome: Progressing   Problem: Skin Integrity: Goal: Risk for impaired skin integrity will decrease Outcome: Progressing   Problem: Education: Goal: Knowledge of risk factors and measures for prevention of condition will improve Outcome: Progressing   Problem: Respiratory: Goal: Will maintain a patent airway Outcome: Progressing Goal: Complications related to the disease process, condition or treatment will be avoided or minimized Outcome: Not Progressing

## 2020-11-09 NOTE — Consult Note (Signed)
Consultation Note Date: 11/09/2020   Patient Name: Erica [name]  DOB: 1950-11-15  MRN: [id number]  Age / Sex: 70 y.o., female   PCP: [facility] Referring Physician: Elodia Florence., *   REASON FOR CONSULTATION:Establishing goals of care  Palliative Care consult requested for goals of care discussion in this 70 y.o. female with multiple medical problems including  COPD (4 L via nasal cannula at baseline), interstitial lung disease, type 2 diabetes, chronic kidney disease stage IV, and chronic diastolic heart failure. Patient presented to ER from home with complaints of dizziness and shortness of breath. Endorsed cough and increased work of breathing 2-3 days prior. She is vaccinated but no booster. On admission, Creatinine 3.3, BNP 2370, COVID-19 positive. Requiring 15L HFNC. She has received Remdesivir and antibiotics. Followed by Nephrology.   Clinical Assessment and Goals of Care: I have reviewed medical records including lab results, imaging, Epic notes, and MAR, and received report from the bedside RN. I spoke with patient to discuss diagnosis prognosis, GOC, EOL wishes, disposition and options.  I introduced Palliative Medicine as specialized medical care for people living with serious illness. It focuses on providing relief from the symptoms and stress of a serious illness. The goal is to improve quality of life for both the patient and the family. She verbalized understanding and appreciation.   We discussed a brief life review of the patient, along with her functional and nutritional status. [name] reports she is divorced. 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 She has one son who is involved in her care.   Patient reports prior to admission she lived alone. She was able to perform most ADLs independently. Some fatigue and would take breaks. Reports she was still driving. Appetite good. Has been on 3-4L of home oxygen for almost 5 years.    We discussed Her current illness and what it means in the larger context of Her on-going co-morbidities. Natural disease trajectory and expectations at EOL were discussed.  [name] confirms her awareness of her current illness and co-morbidities. She reports prior to [place] she felt her quality of life was pretty decent.   I attempted to elicit values and goals of care important to the patient.    Patient is clear in expressed goals that she is hopeful for some stability/improvement. She is hopeful for SNF placement or home health to allow her an opportunity to continue to improve once medically optimized.   She states in the event she reaches a point that she would require dialysis she would be open to treatment. She does endorse if her quality of life greatly declined with dialysis or she was not tolerating she would then make a decision to discontinue treatment.   Advanced directives, concepts specific to code status, artifical feeding and hydration, and rehospitalization were considered and discussed. Patient states she does not have a documented advanced directive, however in the event she is unable to make decisions for herself she would wish for her sister [name] to be her surrogate decision maker.   We discussed at length patient's current full code status. Education provided on current illness, co-morbidities, and what a code situation could look like. Patient verbalizes understanding. She is requesting DNR/DNI. Education provided on DNR. Patient verbalizes understanding again confirming wishes.   I offered the opportunity for her to complete advanced directive and place wishes in writing however, patient declined. She reports her sister knows  her wishes.   Palliative Care services outpatient were explained and offered. Patient verbalized understanding and awareness of palliative's goals and philosophy of care. She is requesting outpatient Palliative services at discharge.    Questions and concerns were addressed.  Patient was encouraged to call with questions or concerns.  PMT will continue to support holistically as needed.   SOCIAL HISTORY:     reports that she has been smoking cigarettes. She has a 13.00 pack-year smoking history. She has never used smokeless tobacco. She reports that she does not drink alcohol and does not use drugs.  CODE STATUS: DNR  ADVANCE DIRECTIVES: Primary Decision Maker: Paatient    SYMPTOM MANAGEMENT: per attending   Palliative Prophylaxis:   Eye Care, Frequent Pain Assessment and Turn Reposition  PSYCHO-SOCIAL/SPIRITUAL:  Support System: Family Desire for further Chaplaincy support:No  Additional Recommendations (Limitations, Scope, Preferences):  Continue to treat the tretable, DNR/DNI  Education on hospice/palliative    PAST MEDICAL HISTORY: Past Medical History:  Diagnosis Date  . Arthritis   . Chronic back pain   . CKD (chronic kidney disease), stage IV ([place])   . COPD (chronic obstructive pulmonary disease) (HCC)    2L home O2  . Diastolic heart failure ([place])   . Essential hypertension   . History of GI bleed    Suspected AVMs  . History of stroke   . Hyperlipidemia   . Interstitial lung disease ([place])   . PSVT (paroxysmal supraventricular tachycardia) ([place])   . Type 2 diabetes mellitus (HCC)     ALLERGIES:  has No Known Allergies.   MEDICATIONS:  Current Facility-Administered Medications  Medication Dose Route Frequency Provider Last Rate Last Admin  . albumin human 25 % solution 25 g  25 g Intravenous Once Elmarie Shiley, MD      . Chlorhexidine Gluconate Cloth 2 % PADS 6 each  6 each Topical Daily Elgergawy, Silver Huguenin, MD   6 each at 11/09/20 1035  . ferrous sulfate tablet 325 mg  325 mg Oral TID [place] Eric J, DO   325 mg at 11/09/20 1204  . guaiFENesin-dextromethorphan (ROBITUSSIN DM) 100-10 MG/5ML syrup 5 mL  5 mL Oral Q4H PRN [place] Eric J, DO   5 mL at 10/30/20 2135  . insulin aspart  (novoLOG) injection 0-9 Units  0-9 Units Subcutaneous TID WC Hollace Hayward K, NP   1 Units at 11/08/20 1747  . Ipratropium-Albuterol (COMBIVENT) respimat 1 puff  1 puff Inhalation Q6H Elgergawy, Silver Huguenin, MD   1 puff at 11/09/20 0855  . linaclotide (LINZESS) capsule 145 mcg  145 mcg Oral Daily PRN Elgergawy, Silver Huguenin, MD   145 mcg at 10/30/20 1445  . MEDLINE mouth rinse  15 mL Mouth Rinse BID Elgergawy, Silver Huguenin, MD   15 mL at 11/09/20 0900  . midodrine (PROAMATINE) tablet 10 mg  10 mg Oral TID WC Roney Jaffe, MD   10 mg at 11/09/20 1204  . ondansetron (ZOFRAN) injection 4 mg  4 mg Intravenous Q8H PRN Lovey Newcomer T, NP   4 mg at 11/05/20 2138  . oxyCODONE (Oxy IR/ROXICODONE) immediate release tablet 5 mg  5 mg Oral Q6H PRN [place] Eric J, DO   5 mg at 10/31/20 0849  . pantoprazole (PROTONIX) EC tablet 40 mg  40 mg Oral Daily Elgergawy, Silver Huguenin, MD   40 mg at 11/09/20 0855  . patiromer Daryll Drown) packet 16.8 g  16.8 g Oral Daily Elmarie Shiley, MD      . Derrill Memo  ON 2020/11/21] predniSONE (DELTASONE) tablet 20 mg  20 mg Oral Q breakfast Elodia Florence., MD       Followed by  . [START ON 11/12/2020] predniSONE (DELTASONE) tablet 10 mg  10 mg Oral Q breakfast Elodia Florence., MD        VITAL SIGNS: BP 127/79 (BP Location: Right Arm)   Pulse 88   Temp (!) 97.5 F (36.4 C) (Axillary)   Resp (!) 22   Ht '5\' 4"'$  (1.626 m)   Wt 63.5 kg   SpO2 99%   BMI 24.03 kg/m  Filed Weights   11/07/20 0500 11/08/20 0500 11/09/20 0545  Weight: 57.4 kg 58.1 kg 63.5 kg    Estimated body mass index is 24.03 kg/m as calculated from the following:   Height as of this encounter: '5\' 4"'$  (1.626 m).   Weight as of this encounter: 63.5 kg.  LABS: CBC:    Component Value Date/Time   WBC 8.3 11/09/2020 0352   HGB 12.9 11/09/2020 0352   HCT 38.4 11/09/2020 0352   PLT 38 (L) 11/09/2020 0352   Comprehensive Metabolic Panel:    Component Value Date/Time   NA 138 11/09/2020 0352   K 5.3 (H)  11/09/2020 0352   BUN 129 (H) 11/09/2020 0352   CREATININE 3.36 (H) 11/09/2020 0352   ALBUMIN 3.5 11/09/2020 0352     Review of Systems  Neurological: Positive for weakness.   Unless otherwise noted, a complete review of systems is negative.  The above conversation was completed via telephone due to the visitor restrictions during the COVID-19 pandemic. Thorough chart review and discussion with necessary members of the care team was completed as part of assessment. All issues were discussed and addressed but no physical exam was performed.   Prognosis: Guarded  Discharge Planning:  [place] for rehab with Palliative care service follow-up  Recommendations: . DNR/DNI-as requested and confirmed by patient . Continue with current plan of care. Would be open to dialysis if required.  . Patient remains hopeful for some improvement/stability. Interested in SNF rehab or home health for opportunity to improve.  . Reports her sister [name] would be medical decision maker if unable to speak for herself.  . Request Outpatient Palliative support at discharge [facility] referral placed) . PMT will continue to support and follow as needed. Please call team line with urgent needs.   Palliative Performance Scale: PPS 30%              Patient  expressed understanding and was in agreement with this plan.   Thank you for allowing the Palliative Medicine Team to assist in the care of this patient.  Time In: 1330 Time Out: 1415 Time Total: 45 min  Visit consisted of counseling and education dealing with the complex and emotionally intense issues of symptom management and palliative care in the setting of serious and potentially life-threatening illness.Greater than 50%  of this time was spent counseling and coordinating care related to the above assessment and plan.  Signed by:  Alda Lea, AGPCNP-BC Palliative Medicine Team  Phone: [phone] Pager:  [phone] Amion: Bjorn Pippin

## 2020-11-09 NOTE — Consult Note (Addendum)
[place]  [phone] [phone]) [phone]    [place]  Referring MD:  Dr. Alben Deeds   Reason for Referral: Thrombocytopenia  HPI: Ms. Uruchima is a 70 year old female with a past medical history significant for COPD and chronic hypoxic respiratory failure on 4 L of O2 via nasal cannula at baseline, interstitial lung disease, type 2 diabetes mellitus, CKD stage IV, diastolic heart failure.  The patient presented to the emergency room with complaints of shortness of breath and lightheadedness.  She was hypoxic in the emergency room requiring 15 L of O2 and BNP was significantly elevated.  Creatinine was elevated at 3.3 and she tested positive for COVID-19.  She has been started on remdesivir as well as broad-spectrum antibiotics due to elevated lactic acid level on admission.  On admission, the patient's platelet count was 117,000.  During this hospitalization, the platelet count has slowly drifted down and is down to 38,000 today.  Last available platelet count available to me prior to this admission was performed on 03/30/2020 and was normal at 172,000.  The patient's WBC and hemoglobin remain normal.  CMET from today has been reviewed as well and creatinine is 3.36, AST 247, ALT 321, and T bili 3.0.  Liver ultrasound was performed 11/07/2020 and showed numerous gallstones but no acute cholecystitis and findings suggestive of cirrhosis.  The patient has received antibiotics including azithromycin from 2/5 through 2/8 and ceftriaxone 2/4 through 2/9.  She received remdesivir from 2/4 through 2/7.  Additionally, she has been receiving subcu heparin every 8 hours which was started on 2/3 and discontinued on 2/14.  Also of note, the patient had a CT of the chest without contrast which showed an area of spiculation in the lung base in the right lower lobe adjacent to the diaphragm which measures 12 x 10 mm and has increased in appearance when  compared to the prior scan 2019.  Radiology recommending outpatient PET scan for further evaluation.  Continues to have shortness of breath and hypoxia.  Currently on O2 at 9 L/min.  No bleeding reported.  Hematology was asked to the patient make recommendations regarding her thrombocytopenia.  Past Medical History:  Diagnosis Date  . Arthritis   . Chronic back pain   . CKD (chronic kidney disease), stage IV ([place])   . COPD (chronic obstructive pulmonary disease) ([facility])    2L home O2  . Diastolic heart failure ([place])   . Essential hypertension   . History of GI bleed    Suspected AVMs  . History of stroke   . Hyperlipidemia   . Interstitial lung disease ([place])   . PSVT (paroxysmal supraventricular tachycardia) ([place])   . Type 2 diabetes mellitus ([place])   :    Past Surgical History:  Procedure Laterality Date  . ABDOMINAL HYSTERECTOMY    . COLONOSCOPY  2009   Dr. Gala Romney: cluster of diminutive rectal polyps and 2 diminutive polyps in rectosigmoid junction, hyperplastic  . COLONOSCOPY WITH PROPOFOL N/A 10/18/2017   Procedure: COLONOSCOPY WITH PROPOFOL;  Surgeon: Daneil Dolin, MD;  [facility];  Service: Endoscopy;  Laterality: N/A;  9:30am  . ESOPHAGOGASTRODUODENOSCOPY N/A 01/25/2018   Procedure: ESOPHAGOGASTRODUODENOSCOPY (EGD);  Surgeon: Danie Binder, MD;  Location: [facility];  Service: Endoscopy;  Laterality: N/A;  . ESOPHAGOGASTRODUODENOSCOPY (EGD) WITH PROPOFOL N/A 08/29/2017   Dr. Gala Romney with propofol: small hiatal hernia. 4cm fundal diverticulum.   Marland Kitchen GIVENS CAPSULE STUDY N/A 01/25/2018   Procedure:  GIVENS CAPSULE STUDY;  Surgeon: Danie Binder, MD;  Location: [facility];  Service: Endoscopy;  Laterality: N/A;  . HAND SURGERY     In an accident at work, finger amputated  :   CURRENT MEDS: Current Facility-Administered Medications  Medication Dose Route Frequency Provider Last Rate Last Admin  . 0.45 % sodium chloride infusion   Intravenous Continuous  Roney Jaffe, MD 75 mL/hr at 11/09/20 0439 New Bag at 11/09/20 0439  . Chlorhexidine Gluconate Cloth 2 % PADS 6 each  6 each Topical Daily Elgergawy, Silver Huguenin, MD   6 each at 11/06/20 0941  . ferrous sulfate tablet 325 mg  325 mg Oral TID [place] Eric J, DO   325 mg at 11/09/20 [id number]  . guaiFENesin-dextromethorphan (ROBITUSSIN DM) 100-10 MG/5ML syrup 5 mL  5 mL Oral Q4H PRN [place] Eric J, DO   5 mL at 10/30/20 2135  . insulin aspart (novoLOG) injection 0-9 Units  0-9 Units Subcutaneous TID WC Hollace Hayward K, NP   1 Units at 11/08/20 1747  . Ipratropium-Albuterol (COMBIVENT) respimat 1 puff  1 puff Inhalation Q6H Elgergawy, Silver Huguenin, MD   1 puff at 11/09/20 0855  . linaclotide (LINZESS) capsule 145 mcg  145 mcg Oral Daily PRN Elgergawy, Silver Huguenin, MD   145 mcg at 10/30/20 1445  . MEDLINE mouth rinse  15 mL Mouth Rinse BID Elgergawy, Silver Huguenin, MD   15 mL at 11/09/20 0900  . midodrine (PROAMATINE) tablet 10 mg  10 mg Oral TID WC Roney Jaffe, MD   10 mg at 11/09/20 0854  . ondansetron (ZOFRAN) injection 4 mg  4 mg Intravenous Q8H PRN Lovey Newcomer T, NP   4 mg at 11/05/20 2138  . oxyCODONE (Oxy IR/ROXICODONE) immediate release tablet 5 mg  5 mg Oral Q6H PRN [place] Eric J, DO   5 mg at 10/31/20 0849  . pantoprazole (PROTONIX) EC tablet 40 mg  40 mg Oral Daily Elgergawy, Silver Huguenin, MD   40 mg at 11/09/20 0855  . [START ON 11-20-2020] predniSONE (DELTASONE) tablet 20 mg  20 mg Oral Q breakfast Elodia Florence., MD       Followed by  . [START ON 11/12/2020] predniSONE (DELTASONE) tablet 10 mg  10 mg Oral Q breakfast Elodia Florence., MD          No Known Allergies:  Family History  Problem Relation Age of Onset  . CVA Mother 29  . Cancer Neg Hx   . Colon cancer Neg Hx   . Breast cancer Neg Hx   :  Social History   Socioeconomic History  . Marital status: Divorced    Spouse name: Not on file  . Number of children: Not on file  . Years of education: Not on file  .  Highest education level: Not on file  Occupational History  . Occupation: retired  Tobacco Use  . Smoking status: Current Some Day Smoker    Packs/day: 0.25    Years: 52.00    Pack years: 13.00    Types: Cigarettes  . Smokeless tobacco: Never Used  Vaping Use  . Vaping Use: Never used  Substance and Sexual Activity  . Alcohol use: No  . Drug use: No  . Sexual activity: Not Currently  Other Topics Concern  . Not on file  Social History Narrative   ** Merged History Encounter **       Social Determinants of Health   Financial Resource Strain:  Not on file  Food Insecurity: Not on file  Transportation Needs: Not on file  Physical Activity: Not on file  Stress: Not on file  Social Connections: Not on file  Intimate Partner Violence: Not on file  :  REVIEW OF SYSTEMS:  A comprehensive 14 point review of systems was negative except as noted in the HPI.    Exam: Patient Vitals for the past 24 hrs:  BP Temp Temp src Pulse Resp SpO2 Weight  11/09/20 0545 110/72 97.8 F (36.6 C) Oral 84 (!) 21 96 % 63.5 kg  11/08/20 2042 119/69 97.7 F (36.5 C) Oral 82 18 95 % --  11/08/20 1706 119/70 (!) 97.5 F (36.4 C) Rectal 83 20 97 % --  11/08/20 1537 132/67 -- -- 85 20 100 % --  11/08/20 1300 110/65 (!) 97.4 F (36.3 C) -- 88 17 90 % --  11/08/20 1149 98/61 (!) 97.5 F (36.4 C) Axillary 90 (!) 22 94 % --   Exam per primary team.  Not fully examined due to COVID-19 positive status.  LABS:  Lab Results  Component Value Date   WBC 8.3 11/09/2020   HGB 12.9 11/09/2020   HCT 38.4 11/09/2020   PLT 38 (L) 11/09/2020   GLUCOSE 89 11/09/2020   TRIG 102 11/02/2020   ALT 321 (H) 11/09/2020   AST 247 (H) 11/09/2020   NA 138 11/09/2020   K 5.3 (H) 11/09/2020   CL 103 11/09/2020   CREATININE 3.36 (H) 11/09/2020   BUN 129 (H) 11/09/2020   CO2 20 (L) 11/09/2020   INR 2.6 (H) 11/09/2020   HGBA1C 5.3 10/29/2020    CT ABDOMEN PELVIS WO CONTRAST  Result Date: 11/08/2020 CLINICAL  DATA:  70 year old female with abdominal pain. EXAM: CT ABDOMEN AND PELVIS WITHOUT CONTRAST TECHNIQUE: Multidetector CT imaging of the abdomen and pelvis was performed following the standard protocol without IV contrast. COMPARISON:  CT abdomen pelvis dated 08/27/2017. FINDINGS: Evaluation of this exam is limited in the absence of intravenous contrast. Lower chest: Diffuse bilateral interstitial coarsening, progressed since the prior CT. There is a 1.7 x 2.5 cm right lung base subpleural nodule which may represent complex pleural effusion or an area of rounded atelectasis. A mass is not excluded. Dedicated chest CT is recommended for better evaluation on a nonemergent/outpatient basis. Additional 11 x 20 mm nodular density at the right lung base (6/2) likely an area of scarring. There is cardiomegaly. Coronary vascular calcification. No intra-abdominal free air.  Small ascites. Hepatobiliary: The liver is unremarkable. No intrahepatic biliary dilatation. The gallbladder is distended. No calcified gallstone. Pancreas: Unremarkable. No pancreatic ductal dilatation or surrounding inflammatory changes. Spleen: Normal in size without focal abnormality. Adrenals/Urinary Tract: The adrenal glands unremarkable. Atrophic right kidney. Vascular calcification versus punctate nonobstructing left renal upper pole calculus. No hydronephrosis. The left ureter and urinary bladder appear unremarkable. Stomach/Bowel: There is sigmoid diverticulosis without active inflammatory changes. There is no bowel obstruction or active inflammation. The appendix is not visualized with certainty. No inflammatory changes identified in the right lower quadrant. Vascular/Lymphatic: Advanced aortoiliac atherosclerotic disease. The IVC is unremarkable. No portal venous gas. There is no adenopathy. Reproductive: Hysterectomy.  No adnexal masses. Other: Midline vertical anterior pelvic wall incisional scar. No fluid collection. Musculoskeletal:  Osteopenia with degenerative changes of the spine. No acute osseous pathology. IMPRESSION: 1. No acute intra-abdominal or pelvic pathology. 2. Sigmoid diverticulosis. No bowel obstruction. 3. Atrophic right kidney. 4. Small ascites. 5. Right lung base subpleural nodule. Dedicated chest  CT is recommended for better evaluation on a nonemergent/outpatient basis. 6. Aortic Atherosclerosis (ICD10-I70.0). Electronically Signed   By: Anner Crete M.D.   On: 11/07/2020 17:04   CT CHEST WO CONTRAST  Result Date: 11/03/2020 CLINICAL DATA:  Possible right pneumothorax on recent plain film EXAM: CT CHEST WITHOUT CONTRAST TECHNIQUE: Multidetector CT imaging of the chest was performed following the standard protocol without IV contrast. COMPARISON:  Film from earlier in the same day. FINDINGS: Cardiovascular: Somewhat limited due to lack of IV contrast. Aortic atherosclerotic changes are noted. Coronary calcifications are seen. Heart is enlarged in size. The pulmonary artery as visualized shows no acute abnormality. Mediastinum/Nodes: Thoracic inlet is within normal limits. Scattered small lymph nodes are again seen and stable likely of a reactive nature. Some of these are calcified in nature likely related to prior granulomatous disease. The esophagus as visualized is within normal limits. Lungs/Pleura: Filling defect is noted at the level of the carina likely related to mucous. Somewhat spiculated soft tissue nodule is noted in the right lower lobe best seen on image number 107 of series 5 similar to that seen on prior CT of the abdomen and pelvis. It is larger than on prior CT from 2019. The lungs again demonstrate diffuse emphysematous change with fibrotic changes and honeycombing similar to that seen on prior CT of the chest. Scattered calcifications are noted likely related to prior granulomatous disease. Scarring is noted in the bases bilaterally. Significant improved aeration when compared with the most recent CT  of the abdomen and pelvis is seen. Calcified pleural plaques and pleural thickening are seen on the right which accounts for the density seen on recent plain film. No pneumothorax is noted. Upper Abdomen: Visualized upper abdomen demonstrates mild ascites. Right kidney is shrunken no other focal abnormality is seen. Musculoskeletal: Degenerative changes of the thoracic spine are noted. No acute rib abnormality is seen. IMPRESSION: No evidence of pneumothorax. Calcified pleural plaques on the right and pleural thickening account for the density seen on recent chest x-ray. Area of spiculation at the lung base in the right lower lobe adjacent to the diaphragm which measures at least 12 x 10 mm and is increased in appearance when compared with the prior exam of 2019 but relatively stable from the most recent CT of the abdomen and pelvis. PET-CT may be helpful for further evaluation to assess for activity when the patient's condition improves. Chronic interstitial thickening and fibrosis similar to that seen on prior exam. No acute infiltrate is noted. Improved aeration in the bases is seen when compared with the prior exam. Mild ascites Aortic Atherosclerosis (ICD10-I70.0). Electronically Signed   By: Inez Catalina M.D.   On: 11/03/2020 15:35   NM Pulmonary Perfusion  Result Date: 11/21/2020 CLINICAL DATA:  Respiratory failure. Acute on chronic respiratory failure. EXAM: NUCLEAR MEDICINE PERFUSION LUNG SCAN TECHNIQUE: Perfusion images were obtained in multiple projections after intravenous injection of radiopharmaceutical. Ventilation scans intentionally deferred if perfusion scan and chest x-ray adequate for interpretation during COVID 19 epidemic. RADIOPHARMACEUTICALS:  4.2 mCi Tc-77mMAA IV COMPARISON:  Chest radiograph earlier today. FINDINGS: No wedge-shaped perfusion defects to suggest pulmonary embolus. Minimal defect in the periphery of the right mid lung corresponds to small amount of fluid in the fissure on  radiograph. Cardiomegaly. IMPRESSION: No scintigraphic evidence of pulmonary embolus. Electronically Signed   By: MKeith RakeM.D.   On: 11/21/2020 18:00   [place]  Result Date: 11/05/2020 CLINICAL DATA:  Acute renal injury EXAM: RENAL /  URINARY TRACT ULTRASOUND COMPLETE COMPARISON:  11/06/2020 FINDINGS: Right Kidney: Renal measurements: 5.7 x 2.1 x 2.0 cm. = volume: 12.8 mL. Diffusely increased in echogenicity and atrophied similar to that seen on prior CT examination. Left Kidney: Renal measurements: 9.5 x 5.1 x 4.7 cm = volume: 118 mL. No mass lesion or hydronephrosis is noted. No definitive calculi are seen. Bladder: Partially distended. Other: Note is made of mild ascites as well as evidence of cholelithiasis. Electronically Signed   By: Inez Catalina M.D.   On: 11/05/2020 10:38   DG CHEST PORT 1 VIEW  Result Date: 11/08/2020 CLINICAL DATA:  Shortness of breath, COVID-19. EXAM: PORTABLE CHEST 1 VIEW COMPARISON:  11/07/2020 and CT chest 11/03/2020. FINDINGS: Patient is rotated. Trachea is midline. Heart is enlarged. Pleuroparenchymal scarring and calcification in the lateral and inferior right hemithorax. Lungs are hyperinflated without dense airspace consolidation or pleural fluid. IMPRESSION: 1. No acute findings. 2. Pleuroparenchymal scarring in the right hemithorax. 3. Basilar nodule seen in the right lower lobe on 11/03/2020 is not well appreciated. Electronically Signed   By: Lorin Picket M.D.   On: 11/08/2020 14:07   DG CHEST PORT 1 VIEW  Result Date: 11/07/2020 CLINICAL DATA:  Respiratory failure.  Reported COVID-19 positive EXAM: PORTABLE CHEST 1 VIEW COMPARISON:  Chest radiograph November 06, 2020; chest CT November 03, 2020 FINDINGS: Pleural calcification on the right is again noted. There is scarring in the right base. Scattered areas of fibrosis in the right mid and lower lung regions is stable. There is no appreciable edema or consolidation. Heart remains enlarged. The pulmonary  vascularity is normal. No adenopathy. There is aortic atherosclerosis. No evident bone lesions. IMPRESSION: Areas of scarring/fibrosis on the right. Pleural calcification raises question of previous asbestos exposure. No edema or airspace opacity. Stable cardiomegaly. Aortic Atherosclerosis (ICD10-I70.0). Comment: Spiculated lesion right lower lung region seen on recent CT not appreciable by radiography. Electronically Signed   By: Lowella Grip III M.D.   On: 11/07/2020 10:15   DG CHEST PORT 1 VIEW  Result Date: 11/06/2020 CLINICAL DATA:  Shortness of breath.  COVID-19 positive EXAM: PORTABLE CHEST 1 VIEW COMPARISON:  Chest radiograph and chest CT November 03, 2020. Chest radiograph Feb 07, 2020 FINDINGS: There is pleural calcification on the right which may be indicative of previous asbestos exposure. There is chronic blunting of the right costophrenic angle consistent with scarring. There is mild scarring elsewhere in each lung base. There is fibrotic change in the right mid and lower lung regions, stable. There is no appreciable edema or consolidation. Heart is enlarged with pulmonary vascularity normal. No adenopathy. There is aortic atherosclerosis. No bone lesions. IMPRESSION: Pleural calcification on the right. Question previous asbestos exposure. Areas of fibrotic type change, primarily in the right lower lung region. Chronic blunting of the right costophrenic angle. Scarring in the bases. No edema or airspace opacity. Note that a somewhat spiculated area in the right lower lung region seen on recent CT is not appreciable by radiography. Stable cardiomegaly. No evident adenopathy. Aortic Atherosclerosis (ICD10-I70.0). Electronically Signed   By: Lowella Grip III M.D.   On: 11/06/2020 14:48   DG CHEST PORT 1 VIEW  Result Date: 11/03/2020 CLINICAL DATA:  Shortness of breath and respiratory failure secondary to COVID-19 pneumonia. EXAM: PORTABLE CHEST 1 VIEW COMPARISON:  11/09/2020 FINDINGS:  Stable cardiac enlargement. There remains some density in the right chest which appears primarily related to the pleura with component of calcified pleural plaque suspected. It is difficult to  exclude a component of a small lateral pneumothorax. Small bilateral pleural effusions are suspected. Probable mild infiltrate at the right lung base. Suggestion of pulmonary interstitial edema on the prior study has decreased. IMPRESSION: 1. Stable cardiac enlargement. Possible small bilateral pleural effusions. 2. Difficult to exclude a small lateral pneumothorax on the right. Consider evaluation with CT of the chest to determine if there is a pneumothorax present. 3. Possible mild infiltrate at the right lung base. 4. Decrease in pulmonary interstitial edema. Electronically Signed   By: Aletta Edouard M.D.   On: 11/03/2020 08:25   DG Chest Port 1 View  Result Date: 11/03/2020 CLINICAL DATA:  70 year old female with shortness of breath. EXAM: PORTABLE CHEST 1 VIEW COMPARISON:  Chest radiograph dated 03/28/2020. FINDINGS: There is cardiomegaly with vascular congestion. There is diffuse chronic interstitial coarsening in keeping with interstitial lung disease. There are small bilateral pleural effusions. Calcified right pleural plaque. Slight increased density at the right lung base may represent atelectasis. Developing infiltrate is not excluded. There is no pneumothorax. Atherosclerotic calcification of the aorta. No acute osseous pathology. IMPRESSION: 1. Cardiomegaly with vascular congestion and small bilateral pleural effusions. 2. Background of interstitial lung disease. 3. Right lung base atelectasis versus developing infiltrate. Electronically Signed   By: Anner Crete M.D.   On: 11/18/2020 15:42   ECHOCARDIOGRAM LIMITED  Result Date: 11/03/2020    ECHOCARDIOGRAM LIMITED REPORT   Patient Name:   KORINA MACIAS Date of Exam: 11/03/2020 Medical Rec #:  [id number]        Height:       64.0 in Accession #:     [id number]       Weight:       121.5 lb Date of Birth:  07/06/1951        BSA:          1.583 m Patient Age:    22 years         BP:           108/76 mmHg Patient Gender: F                HR:           107 bpm. Exam Location:  Inpatient Procedure: Limited Echo, Cardiac Doppler and Color Doppler Indications:    Elevated brain natriuretic peptide (BNP) level [id number]  History:        Patient has prior history of Echocardiogram examinations, most                 recent 03/28/2020. COPD; Risk Factors:Hypertension, Diabetes,                 Dyslipidemia and Current Smoker.  Sonographer:    Vickie Epley RDCS Referring Phys: [phone] A CALDWELL POWELL JR  Sonographer Comments: Covid positive. IMPRESSIONS  1. ? ascites noted on subcostoal images.  2. Left ventricular ejection fraction, by estimation, is 60 to 65%. The left ventricle has normal function. The left ventricle has no regional wall motion abnormalities. Left ventricular diastolic parameters were normal.  3. Marked cor pulmonale with flattened septum in both systole and diastole. Right ventricular systolic function is severely reduced. The right ventricular size is severely enlarged.  4. Right atrial size was severely dilated.  5. The mitral valve is normal in structure. Mild mitral valve regurgitation. No evidence of mitral stenosis.  6. Tricuspid valve regurgitation is severe.  7. The aortic valve is normal in structure. Aortic valve regurgitation is not visualized. No aortic  stenosis is present.  8. The inferior vena cava is dilated in size with <50% respiratory variability, suggesting right atrial pressure of 15 mmHg. FINDINGS  Left Ventricle: Left ventricular ejection fraction, by estimation, is 60 to 65%. The left ventricle has normal function. The left ventricle has no regional wall motion abnormalities. The left ventricular internal cavity size was normal in size. There is  no left ventricular hypertrophy. Left ventricular diastolic parameters were normal.  Right Ventricle: Marked cor pulmonale with flattened septum in both systole and diastole. The right ventricular size is severely enlarged. Right vetricular wall thickness was not assessed. Right ventricular systolic function is severely reduced. Left Atrium: Left atrial size was normal in size. Right Atrium: Right atrial size was severely dilated. Pericardium: There is no evidence of pericardial effusion. Mitral Valve: The mitral valve is normal in structure. Mild mitral valve regurgitation. No evidence of mitral valve stenosis. Tricuspid Valve: The tricuspid valve is normal in structure. Tricuspid valve regurgitation is severe. No evidence of tricuspid stenosis. Aortic Valve: The aortic valve is normal in structure. Aortic valve regurgitation is not visualized. No aortic stenosis is present. Pulmonic Valve: The pulmonic valve was normal in structure. Pulmonic valve regurgitation is mild. No evidence of pulmonic stenosis. Aorta: The aortic root is normal in size and structure. Venous: The inferior vena cava is dilated in size with less than 50% respiratory variability, suggesting right atrial pressure of 15 mmHg. IAS/Shunts: No atrial level shunt detected by color flow Doppler. Additional Comments: ? ascites noted on subcostoal images. LEFT VENTRICLE PLAX 2D LVIDd:         3.90 cm     Diastology LVIDs:         3.40 cm     LV e' medial:    4.80 cm/s LV PW:         0.60 cm     LV E/e' medial:  12.6 LV IVS:        0.60 cm     LV e' lateral:   7.20 cm/s LVOT diam:     1.80 cm     LV E/e' lateral: 8.4 LV SV:         23 LV SV Index:   15 LVOT Area:     2.54 cm  LV Volumes (MOD) LV vol d, MOD A4C: 42.8 ml LV vol s, MOD A4C: 21.5 ml LV SV MOD A4C:     42.8 ml RIGHT VENTRICLE RV S prime:     5.62 cm/s TAPSE (M-mode): 0.7 cm LEFT ATRIUM         Index      RIGHT ATRIUM           Index LA diam:    2.80 cm 1.77 cm/m RA Area:     25.80 cm                                RA Volume:   87.60 ml  55.35 ml/m  AORTIC VALVE LVOT  Vmax:   70.50 cm/s LVOT Vmean:  40.200 cm/s LVOT VTI:    0.091 m MITRAL VALVE               TRICUSPID VALVE MV Area (PHT): 4.31 cm    TR Peak grad:   51.0 mmHg MV Decel Time: 176 msec    TR Vmax:        357.00 cm/s MR Peak grad: 80.6 mmHg MR Vmax:  449.00 cm/s  SHUNTS MV E velocity: 60.40 cm/s  Systemic VTI:  0.09 m MV A velocity: 65.60 cm/s  Systemic Diam: 1.80 cm MV E/A ratio:  0.92 Jenkins Rouge MD Electronically signed by Jenkins Rouge MD Signature Date/Time: 11/03/2020/3:30:32 PM    Final    US Abdomen Limited RUQ (LIVER/GB)  Result Date: 11/07/2020 CLINICAL DATA:  Elevated bilirubin.  COVID-19 infection. EXAM: ULTRASOUND ABDOMEN LIMITED RIGHT UPPER QUADRANT COMPARISON:  CT, 11/06/2020 FINDINGS: Gallbladder: Numerous stones with posterior acoustic shadowing limiting the assessment of the gallbladder. No wall thickening. No sonographic Mian's sign. Common bile duct: Diameter: 6 mm Liver: Normal size. Coarsened echotexture. Nodular anterior contour. No masses. Portal vein is patent on color Doppler imaging with normal direction of blood flow towards the liver. Other: Small amount of ascites. IMPRESSION: 1. Numerous gallstones, but no evidence of acute cholecystitis. Common bile duct normal in caliber for age. 2. Sonographic appearance of the liver suggests cirrhosis. No liver masses. 3. Small amount of ascites. Electronically Signed   By: Lajean Manes M.D.   On: 11/07/2020 20:55     ASSESSMENT AND PLAN:  This is a 70 year old female with  1) thrombocytopenia - ?  Medication side effect versus HIT (4T score=4, intermediate probability) versus bone marrow suppression from acute infection versus due to cirrhosis  2) right lung lesion  3) acute on chronic hypoxic respiratory failure secondary to COVID-19 infection, COPD, interstitial lung disease  4) acute on chronic CKD  5) HFpEF with exacerbation, pulmonary hypertension  6) elevated LFTs and concern for cirrhosis noted on ultrasound  7)  type 2 diabetes mellitus  8) hypertension  PLAN: -The patient had a normal platelet count in July 2021 and on admission had a mildly decreased platelet count.  This admission, platelet count has slowly dropped and is down to 38,000 today. -Recommend platelet transfusion for platelet count less than 10,000 or active bleeding. -4T HIT score is indeterminate and will send off HIT antibody and serotonin release assay.  Continue to hold heparin. -Peripheral blood smear has been ordered for review. -Fractionated bilirubin has been ordered to differentiate if elevated T bili is due to hemolysis versus underlying liver disease. -Right lung lesion will need further work-up as an outpatient.  Thank you for this referral.  Mikey Bussing, DNP, AGPCNP-BC, AOCNP  ADDENDUM  .Patient was Personally and independently interviewed, examined and relevant elements of the history of present illness were reviewed in details and an assessment and plan was created. All elements of the patient's history of present illness , assessment and plan were discussed in details with Mikey Bussing, DNP, AGPCNP-BC, AOCNP. The above documentation reflects our combined findings assessment and plan.  Worsening thrombocytopenia in the inpatient setting in the context of Covid infection, baseline liver cirrhosis with worsened liver functions (possibly from ischemia due to hypotension or sepsis or medication effect), multiple antibiotics used and sepsis.  Cannot rule out HIT syndrome. Plan -Platelet count in citrate to rule out pseudothrombocytopenia. -Immature platelet fraction -Minimize patient's causing thrombocytopenia -Currently off heparin.  HIT antibodies and serotonin release assay. -DIC panel -Transfuse platelets as needed of bleeding or platelet count less than 10k.   Sullivan Lone MD MS

## 2020-11-09 NOTE — Progress Notes (Signed)
PROGRESS NOTE    Erica [name]  [id number] DOB: 29-May-1951 DOA: 11/18/2020 PCP: [facility]   Chief Complaint  Patient presents with  . Shortness of Breath    Brief Narrative:  Erica [name] is Erica [name] 70 year old female with past medical history significant for COPD/chronic hypoxic respiratory failure on 4 L nasal cannula at baseline, interstitial lung disease, type 2 diabetes mellitus, chronic kidney disease stage IV, chronic diastolic congestive heart failure who presented to the ED with progressive shortness of breath. Patient also with associated dizziness, cough, increased work of breathing. Patient denies any productive cough/phlegm, no fever/chills, no recent weight gain or lower extremity edema. Patient reports vaccinated against Covid-19, but has not yet received Khaled Herda booster dose.  She was admitted for acute on chronic hypoxic respiratory failure in the setting of COVID 19 pneumonia, COPD exacerbation, heart failure, in addition to superimposed bacterial pneumonia.  She continues to have high oxygen requirements (baseline on 4 L, was initially improved, worsening over past several days).  Hospitalization has been complicated by AKI and now progressive liver injury.   Assessment & Plan:   Principal Problem:   Acute on chronic respiratory failure (HCC) Active Problems:   COPD exacerbation (HCC)   Hypertension   Diabetes mellitus type 2, controlled ([place])   (HFpEF) heart failure with preserved ejection fraction (HCC)   Acute renal failure superimposed on stage 4 chronic kidney disease (HCC)   COPD with acute exacerbation (HCC)   Hyperkalemia   Acute diastolic (congestive) heart failure (HCC)   Anemia due to stage 4 chronic kidney disease (HCC)   Cigarette smoker   Pneumonia due to COVID-19 virus  Acute on Chronic Hypoxic Respiratory Failure 2/2 COVID 19 Infection (baseline 3-4 L [place]) Patient is vaccinated against Covid-19 but has not received  booster dose. --COVID test: + 11/08/2020 --requiring ~9 L HFNC this AM - increasing - wean as tolerated (baseline 3-4 L per her report, some discrepancies in chart, but she reports 4 L) --VQ scan without evidence of PE on 2/3 --CXR 2/9 with mild infiltrate at R lung base, decrease in pulm interstitial edema - difficult to exclude small lateral pneumothorax -> CT chest 2/9 without evidence of pneumothorax.  Calcified pleural plaques on R and pleural thickening.  Area of spiculation at the lung base in the R lower lobe (increased in appearance from 2019, but relatively stable from recent CT abdomen/pelvis).  Consider PET CT after discharge.  Chronic interstitial thickening and fibrosis similar to prior exam.  Mild ascites. -- CXR 2/14 without acute findings -- CXR 2/13 without edema or airspace opacity -- CXR 2/12 without acute findings -- will follow repeat CT scan without contrast given worsening O2 requirement (in setting of AKI) --Completed 5-day course of remdesivir on 11/01/2020 --taper steroids --actemra/baricitinib avoided due to ?hx diverticulitis and concern for bacterial pneumonia  --Continue supportive care with albuterol MDI prn, vitamin C, zinc, Tylenol, antitussives (benzonatate/ Mucinex/Tussionex) --Follow CBC, CMP, D-dimer, and CRP daily --Continue airborne/contact isolation precautions  --Prone as able, IS, flutter, OOB, therapy  COVID-19 Labs  Recent Labs    11/07/20 0408 11/08/20 0148 11/09/20 0352  DDIMER 1.20* 1.58* 2.01*  FERRITIN 200  --  223  CRP 1.8* 1.7* 1.6*    Lab Results  Component Value Date   SARSCOV2NAA POSITIVE (Jaylynn Siefert) 11/07/2020   SARSCOV2NAA NEGATIVE 03/27/2020   [place] NEGATIVE 02/18/2020   [place] NEGATIVE 05/21/2019   Elevated D Dimer Gradually rising over past several days negative V/Q  scan on 2/3 Follow LE [place] (negative for DVT on 2/15) could consider repeat VQ scan if continuing to rise  Acute renal failure on CKD stage IV   Hypernatremia Creatinine baseline 2.3 - 2.45.  Creatinine peaked at 3.72 (from overdiuresis) Slightly improved today Hold IVF per renal Renal US - R kidney with increased echogenicity and atrophy, no lesion or hydro to L kidney, partially distended bladder --Hold home lisinopril --Strict I's and O's and daily weights --renal c/s, appreciate recs  Elevated LFT's  Concern for Cirrhosis Possibly 2/2 relative hypotension vs covid vs right sided heart failure vs medications?  Follow. AST/ALT worsening, bili rising, INR 2.6, thrombocytopenia - progressive liver failure Follow acute hepatitis panel (negative) Follow RUQ [place] (gallstones, sonographic appearance of liver suggest cirrhosis, small amt of ascites) Continue to monitor Will consult gastroenterology, appreciate their assistance (will see 2/16(  HFpEF with Exacerbation  Pulmonary Hypertension Continued dependent edema on exam, improved oxygenation Takes 40 mg torsemide daily at home Holding diuretics with AKI as noted above Echo with EF 60-65%, cor pulmonale with flattened septum in systole and diastole, RVSF severely reduced, RV size severely enlarged (see report) --> seems similar to previous echo from 03/2020 with D shaped septum suggestive of pulm hypertension, severe RV dysfunction  Community acquired pneumonia, POA Chest x-ray with right lung base infiltrates (as above) --Completed azithro/ceftriaxone --Continue supplemental oxygen as above  Area of spiculation at the lung base in the right lower lobe adjacent to the diaphragm which measures at least 12 x 10 mm  Needs outpatient follow up  Acute on chronic respiratory failure, POA COPD exacerbation Interstitial lung disease --s/p azithromycin --Continue Solu-Medrol as above --Combivent 1 puff inh q6h  Lactic acidosis, POA Resolved  Type 2 diabetes mellitus Hemoglobin A1c 5.3, well-controlled. Diet controlled at home. SSI  Essential hypertension On amlodipine  10 mg p.o. daily, lisinopril 2.5 mg p.o. daily at home. --midodrine per renal  GERD: Continue PPI  Iron deficiency anemia: Continue ferrous sulfate '325mg'$  PO TID  Thrombocytopenia Heme c/s, appreciate recs Follow HIT ab and SRA  smear (anisocytosis, neutrophilia, thrombocytopenia)  Goals of care Palliative care c/s, appreciate recs  DVT prophylaxis: heparin  Code Status: DNR per pallaitive Family Communication: none at bedside - called sister, 2/15, no answer Disposition:   Status is: Inpatient  Remains inpatient appropriate because:Inpatient level of care appropriate due to severity of illness   Dispo:  Patient From: Home  Planned Disposition: Home with Health Care Svc  Expected discharge date: 11/12/2020  Medically stable for discharge: No       Consultants:   none  Procedures: Echo pending  Antimicrobials: Anti-infectives (From admission, onward)   Start     Dose/Rate Route Frequency Ordered Stop   10/30/20 2200  azithromycin (ZITHROMAX) tablet 500 mg  Status:  Discontinued        500 mg Oral Daily at bedtime 10/30/20 1103 11/02/20 1105   10/30/20 1700  vancomycin (VANCOREADY) IVPB 500 mg/100 mL  Status:  Discontinued        500 mg 100 mL/hr over 60 Minutes Intravenous Every 48 hours 11/07/2020 1608 11/19/2020 2003   10/29/20 1600  ceFEPIme (MAXIPIME) 2 g in sodium chloride 0.9 % 100 mL IVPB  Status:  Discontinued        2 g 200 mL/hr over 30 Minutes Intravenous Every 24 hours 11/21/2020 1606 11/17/2020 2003   10/29/20 1000  remdesivir 100 mg in sodium chloride 0.9 % 100 mL IVPB  Status:  Discontinued       "  Followed by" Linked Group Details   100 mg 200 mL/hr over 30 Minutes Intravenous Daily 10/26/2020 2036 11/14/2020 2048   10/29/20 1000  remdesivir 100 mg in sodium chloride 0.9 % 100 mL IVPB        100 mg 200 mL/hr over 30 Minutes Intravenous Daily 11/18/2020 1819 11/01/20 1109   10/29/20 1000  cefTRIAXone (ROCEPHIN) 1 g in sodium chloride 0.9 % 100 mL IVPB  Status:   Discontinued        1 g 200 mL/hr over 30 Minutes Intravenous Every 24 hours 11/08/2020 2003 11/03/20 1437   11/15/2020 2030  azithromycin (ZITHROMAX) 500 mg in sodium chloride 0.9 % 250 mL IVPB  Status:  Discontinued        500 mg 250 mL/hr over 60 Minutes Intravenous Every 24 hours 11/15/2020 2003 10/30/20 1103   11/21/2020 1848  remdesivir 100 mg in sodium chloride 0.9 % 100 mL IVPB       "Followed by" Linked Group Details   100 mg 200 mL/hr over 30 Minutes Intravenous  Once 11/05/2020 1819 11/07/2020 2217   10/27/2020 1830  remdesivir 200 mg in sodium chloride 0.9% 250 mL IVPB  Status:  Discontinued       "Followed by" Linked Group Details   200 mg 580 mL/hr over 30 Minutes Intravenous Once 11/20/2020 2036 11/14/2020 2048   11/06/2020 1830  remdesivir 100 mg in sodium chloride 0.9 % 100 mL IVPB       "Followed by" Linked Group Details   100 mg 200 mL/hr over 30 Minutes Intravenous  Once 11/12/2020 1819 10/27/2020 2128   11/20/2020 1630  vancomycin (VANCOREADY) IVPB 1250 mg/250 mL  Status:  Discontinued        1,250 mg 166.7 mL/hr over 90 Minutes Intravenous  Once 11/15/2020 1605 11/16/2020 2003   11/21/2020 1600  ceFEPIme (MAXIPIME) 2 g in sodium chloride 0.9 % 100 mL IVPB        2 g 200 mL/hr over 30 Minutes Intravenous  Once 10/31/2020 1557 11/19/2020 1750   11/20/2020 1600  metroNIDAZOLE (FLAGYL) IVPB 500 mg        500 mg 100 mL/hr over 60 Minutes Intravenous  Once 11/01/2020 1557 10/27/2020 2022   11/01/2020 1600  vancomycin (VANCOCIN) IVPB 1000 mg/200 mL premix  Status:  Discontinued        1,000 mg 200 mL/hr over 60 Minutes Intravenous  Once 11/16/2020 1557 11/14/2020 1605         Subjective: No new complaints  Objective: Vitals:   11/08/20 1706 11/08/20 2042 11/09/20 0545 11/09/20 1120  BP: 119/70 119/69 110/72 127/79  Pulse: 83 82 84 88  Resp: 20 18 (!) 21 (!) 22  Temp: (!) 97.5 F (36.4 C) 97.7 F (36.5 C) 97.8 F (36.6 C) (!) 97.5 F (36.4 C)  TempSrc: Rectal Oral Oral Axillary  SpO2: 97% 95% 96% 99%   Weight:   63.5 kg   Height:        Intake/Output Summary (Last 24 hours) at 11/09/2020 1544 Last data filed at 11/09/2020 1522 Gross per 24 hour  Intake 2679.61 ml  Output 200 ml  Net 2479.61 ml   Filed Weights   11/07/20 0500 11/08/20 0500 11/09/20 0545  Weight: 57.4 kg 58.1 kg 63.5 kg    Examination:  General: No acute distress. Cardiovascular: Heart sounds show [place] Harb regular rate, and rhythm Lungs: Clear to auscultation bilaterally  Abdomen: Soft, nontender, nondistended  Neurological: Alert and oriented 3. Moves all extremities 4 . Cranial  nerves II through XII grossly intact. Skin: Warm and dry. No rashes or lesions. Extremities: No clubbing or cyanosis. No edema  Data Reviewed: I have personally reviewed following labs and imaging studies  CBC: Recent Labs  Lab 11/06/20 0431 11/07/20 0408 11/07/20 1707 11/08/20 0148 11/09/20 0352  WBC 6.5 6.4 9.7 10.0 8.3  NEUTROABS 5.6 5.5 8.4* 8.6* 7.1  HGB 13.1 12.4 12.9 13.3 12.9  HCT 39.5 37.4 39.4 40.7 38.4  MCV 84.9 85.2 86.4 86.6 85.0  PLT 105* 78* 72* 63* 38*    Basic Metabolic Panel: Recent Labs  Lab 11/05/20 0425 11/06/20 0431 11/07/20 0408 11/08/20 0148 11/09/20 0352  NA 141 140 144 141  142 138  K 5.0 5.0 4.6 5.1  5.1 5.3*  CL 94* 95* 102 102  102 103  CO2 '29 24 23 '$ 21*  21* 20*  GLUCOSE 162* 174* 93 88  88 89  BUN 126* 151* 133* 143*  112* 129*  CREATININE 3.42* 3.72* 3.36* 3.54*  3.49* 3.36*  CALCIUM 7.8* 7.3* 7.1* 7.2*  7.2* 7.6*  MG 2.0 2.2 2.1 2.2 2.2  PHOS 7.3* 6.2* 5.3* 5.7*  5.6* 5.6*    GFR: Estimated Creatinine Clearance: 13.3 mL/min (Tesslyn Baumert) (by C-G formula based on SCr of 3.36 mg/dL (H)).  Liver Function Tests: Recent Labs  Lab 11/06/20 0431 11/07/20 0408 11/08/20 0148 11/09/20 0352 11/09/20 1318  AST 135* 189* 225* 247* 259*  ALT 111* 247* 279* 321* 354*  ALKPHOS 80 73 86 63 78  BILITOT 1.8* 2.2* 2.7* 3.0* 3.9*  PROT 5.9* 5.5* 5.6* 5.5* 6.0*  ALBUMIN 3.3* 3.1* 3.1*  3.1*  3.5 3.8    CBG: Recent Labs  Lab 11/08/20 1144 11/08/20 1703 11/08/20 2045 11/09/20 0754 11/09/20 1124  GLUCAP 97 125* 111* 82 93     No results found for this or any previous visit (from the past 240 hour(s)).       Radiology Studies: DG CHEST PORT 1 VIEW  Result Date: 11/08/2020 CLINICAL DATA:  Shortness of breath, COVID-19. EXAM: PORTABLE CHEST 1 VIEW COMPARISON:  11/07/2020 and CT chest 11/03/2020. FINDINGS: Patient is rotated. Trachea is midline. Heart is enlarged. Pleuroparenchymal scarring and calcification in the lateral and inferior right hemithorax. Lungs are hyperinflated without dense airspace consolidation or pleural fluid. IMPRESSION: 1. No acute findings. 2. Pleuroparenchymal scarring in the right hemithorax. 3. Basilar nodule seen in the right lower lobe on 11/03/2020 is not well appreciated. Electronically Signed   By: Lorin Picket M.D.   On: 11/08/2020 14:07   US Abdomen Limited RUQ (LIVER/GB)  Result Date: 11/07/2020 CLINICAL DATA:  Elevated bilirubin.  COVID-19 infection. EXAM: ULTRASOUND ABDOMEN LIMITED RIGHT UPPER QUADRANT COMPARISON:  CT, 11/13/2020 FINDINGS: Gallbladder: Numerous stones with posterior acoustic shadowing limiting the assessment of the gallbladder. No wall thickening. No sonographic Opheim's sign. Common bile duct: Diameter: 6 mm Liver: Normal size. Coarsened echotexture. Nodular anterior contour. No masses. Portal vein is patent on color Doppler imaging with normal direction of blood flow towards the liver. Other: Small amount of ascites. IMPRESSION: 1. Numerous gallstones, but no evidence of acute cholecystitis. Common bile duct normal in caliber for age. 2. Sonographic appearance of the liver suggests cirrhosis. No liver masses. 3. Small amount of ascites. Electronically Signed   By: Lajean Manes M.D.   On: 11/07/2020 20:55        Scheduled Meds: . Chlorhexidine Gluconate Cloth  6 each Topical Daily  . ferrous sulfate  325 mg Oral TID   .  insulin aspart  0-9 Units Subcutaneous TID WC  . Ipratropium-Albuterol  1 puff Inhalation Q6H  . mouth rinse  15 mL Mouth Rinse BID  . midodrine  10 mg Oral TID WC  . pantoprazole  40 mg Oral Daily  . patiromer  16.8 g Oral Daily  . [START ON 11-30-2020] predniSONE  20 mg Oral Q breakfast   Followed by  . [START ON 11/12/2020] predniSONE  10 mg Oral Q breakfast   Continuous Infusions:    LOS: 12 days    Time spent: over 30 min    Fayrene Helper, MD [facility]   To contact the attending provider between 7A-7P or the covering provider during after hours 7P-7A, please log into the web site [email or web address] and access using universal [place] password for that web site. If you do not have the password, please call the hospital operator.  11/09/2020, 3:44 PM

## 2020-11-10 DIAGNOSIS — U071 COVID-19: Secondary | ICD-10-CM | POA: Diagnosis not present

## 2020-11-10 DIAGNOSIS — J9621 Acute and chronic respiratory failure with hypoxia: Secondary | ICD-10-CM | POA: Diagnosis not present

## 2020-11-10 DIAGNOSIS — J1282 Pneumonia due to coronavirus disease 2019: Secondary | ICD-10-CM | POA: Diagnosis not present

## 2020-11-10 LAB — COMPREHENSIVE METABOLIC PANEL
ALT: 294 U/L — ABNORMAL HIGH (ref 0–44)
AST: 158 U/L — ABNORMAL HIGH (ref 15–41)
Albumin: 4 g/dL (ref 3.5–5.0)
Alkaline Phosphatase: 68 U/L (ref 38–126)
Anion gap: 21 — ABNORMAL HIGH (ref 5–15)
BUN: 105 mg/dL — ABNORMAL HIGH (ref 8–23)
CO2: 16 mmol/L — ABNORMAL LOW (ref 22–32)
Calcium: 8.5 mg/dL — ABNORMAL LOW (ref 8.9–10.3)
Chloride: 104 mmol/L (ref 98–111)
Creatinine, Ser: 3.26 mg/dL — ABNORMAL HIGH (ref 0.44–1.00)
GFR, Estimated: 15 mL/min — ABNORMAL LOW (ref >60)
Glucose, Bld: 111 mg/dL — ABNORMAL HIGH (ref 70–99)
Potassium: 5.6 mmol/L — ABNORMAL HIGH (ref 3.5–5.1)
Sodium: 141 mmol/L (ref 135–145)
Total Bilirubin: 4.1 mg/dL — ABNORMAL HIGH (ref 0.3–1.2)
Total Protein: 6 g/dL — ABNORMAL LOW (ref 6.5–8.1)

## 2020-11-10 LAB — CBC WITH DIFFERENTIAL/PLATELET
Abs Immature Granulocytes: 0.17 10*3/uL — ABNORMAL HIGH (ref 0.00–0.07)
Basophils Absolute: 0 10*3/uL (ref 0.0–0.1)
Basophils Relative: 0 %
Eosinophils Absolute: 0 10*3/uL (ref 0.0–0.5)
Eosinophils Relative: 0 %
HCT: 40 % (ref 36.0–46.0)
Hemoglobin: 13.5 g/dL (ref 12.0–15.0)
Immature Granulocytes: 2 %
Lymphocytes Relative: 2 %
Lymphs Abs: 0.2 10*3/uL — ABNORMAL LOW (ref 0.7–4.0)
MCH: 28.8 pg (ref 26.0–34.0)
MCHC: 33.8 g/dL (ref 30.0–36.0)
MCV: 85.5 fL (ref 80.0–100.0)
Monocytes Absolute: 1.3 10*3/uL — ABNORMAL HIGH (ref 0.1–1.0)
Monocytes Relative: 12 %
Neutro Abs: 9.8 10*3/uL — ABNORMAL HIGH (ref 1.7–7.7)
Neutrophils Relative %: 84 %
Platelets: 42 10*3/uL — ABNORMAL LOW (ref 150–400)
RBC: 4.68 MIL/uL (ref 3.87–5.11)
RDW: 16.8 % — ABNORMAL HIGH (ref 11.5–15.5)
WBC: 11.5 10*3/uL — ABNORMAL HIGH (ref 4.0–10.5)
nRBC: 1.3 % — ABNORMAL HIGH (ref 0.0–0.2)

## 2020-11-10 LAB — GLUCOSE, CAPILLARY: Glucose-Capillary: 108 mg/dL — ABNORMAL HIGH (ref 70–99)

## 2020-11-10 LAB — HEPARIN INDUCED PLATELET AB (HIT ANTIBODY): Heparin Induced Plt Ab: 0.254 OD (ref 0.000–0.400)

## 2020-11-10 LAB — AMMONIA: Ammonia: 27 umol/L (ref 9–35)

## 2020-11-10 LAB — IMMATURE PLATELET FRACTION: Immature Platelet Fraction: 16.3 % — ABNORMAL HIGH (ref 1.2–8.6)

## 2020-11-10 LAB — PROTIME-INR
INR: 2.6 — ABNORMAL HIGH (ref 0.8–1.2)
Prothrombin Time: 26.9 seconds — ABNORMAL HIGH (ref 11.4–15.2)

## 2020-11-10 LAB — MAGNESIUM: Magnesium: 2.2 mg/dL (ref 1.7–2.4)

## 2020-11-10 LAB — PLATELET BY CITRATE

## 2020-11-10 LAB — PHOSPHORUS: Phosphorus: 6 mg/dL — ABNORMAL HIGH (ref 2.5–4.6)

## 2020-11-10 MED ORDER — BISACODYL 5 MG PO TBEC
10.0000 mg | DELAYED_RELEASE_TABLET | Freq: Once | ORAL | Status: AC
  Administered 2020-11-10: 10 mg via ORAL
  Filled 2020-11-10: qty 2

## 2020-11-10 MED ORDER — POLYETHYLENE GLYCOL 3350 17 G PO PACK: 17.0000 g | PACK | Freq: Every day | ORAL | Status: DC

## 2020-11-11 LAB — SEROTONIN RELEASE ASSAY (SRA)
SRA .2 IU/mL UFH Ser-aCnc: 2 % (ref 0–20)
SRA 100IU/mL UFH Ser-aCnc: 3 % (ref 0–20)

## 2020-11-23 NOTE — Progress Notes (Addendum)
Signy.Helper - RN at bedside for shift assessment. Pt alert and conversing but falls back asleep once assessment is completed. This RN told pt I would be back to give her meds between 0930 and 10 am since she wanted to go back to sleep. Pt verbalized understanding and went back to sleep.  [phone]- CNA received call to check on pt and arrives at bedside and states patient appeared "ok".  0912-CNA comes to find this RN and states the "patient is messing with her oxygen and not breathing right."   0913- RN at bedside, pt agonal breathing, still on her 10L via [place]. RN attempted vitals with no success. Pt no longer breathing and is asystole. Pt is a DNR, however, this RN called rapid response RN Clarene Critchley and paged Dr Sabino Gasser to bedside.  [place], [place], RN auscultated heart sounds for 1 full minute with no heart sounds noted.  [place], [place], RN confirm TOD as [phone].   [phone]- Dr. Sabino Gasser arrived to bedside. Dr. Sabino Gasser to contact family.

## 2020-11-23 NOTE — Progress Notes (Signed)
[facility] and spoke to Murlean Caller, reference # [id number] who reports patient is not a candidate for donation.

## 2020-11-23 NOTE — Progress Notes (Signed)
Pt's son, Erica [name], called to speak with RN and Dr. Sabino Gasser. All questions addressed. Erica [name] provided name of funeral home and requests it to be [facility] in [place], [place]

## 2020-11-23 NOTE — Discharge Summary (Signed)
Death Summary  Erica [name] [id number] DOB: 1951-06-16 DOA: November 11, 2020  PCP: [facility] date: November 11, 2020 Date of Death: 11/24/20 Time of Death: 0923 Notification: [facility] notified of death of 11-24-20   History of present illness:  Erica [name] is a 70 yo female with PMH COPD, ILD, chronic hypoxic respiratory failure (on 4L O2), CKD4, DMII, chronic dCHF who presented to the hospital with worsening shortness of breath, dizziness, cough. She was found to be in acute on chronic hypoxic respiratory failure.  She was positive for COVID-19 and found to have evidence of pneumonia on CXR. She slowly developed progressive multiorgan failure with progressive renal and respiratory failure. Goals of care were discussed with family and she was transitioned to DNR/DNI with ongoing medical treatment.   Despite ongoing treatment, she continued to decline and passed naturally on 11/24/2020 at 09 23.    Final Diagnoses:  Acute on chronic hypoxic respiratory failure Pneumonia due to COVID-19 virus Interstitial lung disease Acute on chronic renal failure, stage IV   The results of significant diagnostics from this hospitalization (including imaging, microbiology, ancillary and laboratory) are listed below for reference.    Significant Diagnostic Studies: CT ABDOMEN PELVIS WO CONTRAST  Result Date: 11-Nov-2020 CLINICAL DATA:  70 year old female with abdominal pain. EXAM: CT ABDOMEN AND PELVIS WITHOUT CONTRAST TECHNIQUE: Multidetector CT imaging of the abdomen and pelvis was performed following the standard protocol without IV contrast. COMPARISON:  CT abdomen pelvis dated 08/27/2017. FINDINGS: Evaluation of this exam is limited in the absence of intravenous contrast. Lower chest: Diffuse bilateral interstitial coarsening, progressed since the prior CT. There is a 1.7 x 2.5 cm right lung base subpleural nodule which may represent complex  pleural effusion or an area of rounded atelectasis. A mass is not excluded. Dedicated chest CT is recommended for better evaluation on a nonemergent/outpatient basis. Additional 11 x 20 mm nodular density at the right lung base (6/2) likely an area of scarring. There is cardiomegaly. Coronary vascular calcification. No intra-abdominal free air.  Small ascites. Hepatobiliary: The liver is unremarkable. No intrahepatic biliary dilatation. The gallbladder is distended. No calcified gallstone. Pancreas: Unremarkable. No pancreatic ductal dilatation or surrounding inflammatory changes. Spleen: Normal in size without focal abnormality. Adrenals/Urinary Tract: The adrenal glands unremarkable. Atrophic right kidney. Vascular calcification versus punctate nonobstructing left renal upper pole calculus. No hydronephrosis. The left ureter and urinary bladder appear unremarkable. Stomach/Bowel: There is sigmoid diverticulosis without active inflammatory changes. There is no bowel obstruction or active inflammation. The appendix is not visualized with certainty. No inflammatory changes identified in the right lower quadrant. Vascular/Lymphatic: Advanced aortoiliac atherosclerotic disease. The IVC is unremarkable. No portal venous gas. There is no adenopathy. Reproductive: Hysterectomy.  No adnexal masses. Other: Midline vertical anterior pelvic wall incisional scar. No fluid collection. Musculoskeletal: Osteopenia with degenerative changes of the spine. No acute osseous pathology. IMPRESSION: 1. No acute intra-abdominal or pelvic pathology. 2. Sigmoid diverticulosis. No bowel obstruction. 3. Atrophic right kidney. 4. Small ascites. 5. Right lung base subpleural nodule. Dedicated chest CT is recommended for better evaluation on a nonemergent/outpatient basis. 6. Aortic Atherosclerosis (ICD10-I70.0). Electronically Signed   By: Anner Crete M.D.   On: 11/11/2020 17:04   CT CHEST WO CONTRAST  Result Date: 11/03/2020 CLINICAL  DATA:  Possible right pneumothorax on recent plain film EXAM: CT CHEST WITHOUT CONTRAST TECHNIQUE: Multidetector CT imaging of the chest was performed following the standard protocol without IV contrast. COMPARISON:  Film  from earlier in the same day. FINDINGS: Cardiovascular: Somewhat limited due to lack of IV contrast. Aortic atherosclerotic changes are noted. Coronary calcifications are seen. Heart is enlarged in size. The pulmonary artery as visualized shows no acute abnormality. Mediastinum/Nodes: Thoracic inlet is within normal limits. Scattered small lymph nodes are again seen and stable likely of a reactive nature. Some of these are calcified in nature likely related to prior granulomatous disease. The esophagus as visualized is within normal limits. Lungs/Pleura: Filling defect is noted at the level of the carina likely related to mucous. Somewhat spiculated soft tissue nodule is noted in the right lower lobe best seen on image number 107 of series 5 similar to that seen on prior CT of the abdomen and pelvis. It is larger than on prior CT from 2019. The lungs again demonstrate diffuse emphysematous change with fibrotic changes and honeycombing similar to that seen on prior CT of the chest. Scattered calcifications are noted likely related to prior granulomatous disease. Scarring is noted in the bases bilaterally. Significant improved aeration when compared with the most recent CT of the abdomen and pelvis is seen. Calcified pleural plaques and pleural thickening are seen on the right which accounts for the density seen on recent plain film. No pneumothorax is noted. Upper Abdomen: Visualized upper abdomen demonstrates mild ascites. Right kidney is shrunken no other focal abnormality is seen. Musculoskeletal: Degenerative changes of the thoracic spine are noted. No acute rib abnormality is seen. IMPRESSION: No evidence of pneumothorax. Calcified pleural plaques on the right and pleural thickening account for  the density seen on recent chest x-ray. Area of spiculation at the lung base in the right lower lobe adjacent to the diaphragm which measures at least 12 x 10 mm and is increased in appearance when compared with the prior exam of 2019 but relatively stable from the most recent CT of the abdomen and pelvis. PET-CT may be helpful for further evaluation to assess for activity when the patient's condition improves. Chronic interstitial thickening and fibrosis similar to that seen on prior exam. No acute infiltrate is noted. Improved aeration in the bases is seen when compared with the prior exam. Mild ascites Aortic Atherosclerosis (ICD10-I70.0). Electronically Signed   By: Inez Catalina M.D.   On: 11/03/2020 15:35   NM Pulmonary Perfusion  Result Date: 10/29/2020 CLINICAL DATA:  Respiratory failure. Acute on chronic respiratory failure. EXAM: NUCLEAR MEDICINE PERFUSION LUNG SCAN TECHNIQUE: Perfusion images were obtained in multiple projections after intravenous injection of radiopharmaceutical. Ventilation scans intentionally deferred if perfusion scan and chest x-ray adequate for interpretation during COVID 19 epidemic. RADIOPHARMACEUTICALS:  4.2 mCi Tc-4mMAA IV COMPARISON:  Chest radiograph earlier today. FINDINGS: No wedge-shaped perfusion defects to suggest pulmonary embolus. Minimal defect in the periphery of the right mid lung corresponds to small amount of fluid in the fissure on radiograph. Cardiomegaly. IMPRESSION: No scintigraphic evidence of pulmonary embolus. Electronically Signed   By: MKeith RakeM.D.   On: 11/16/2020 18:00   [place]  Result Date: 11/05/2020 CLINICAL DATA:  Acute renal injury EXAM: RENAL / URINARY TRACT ULTRASOUND COMPLETE COMPARISON:  11/17/2020 FINDINGS: Right Kidney: Renal measurements: 5.7 x 2.1 x 2.0 cm. = volume: 12.8 mL. Diffusely increased in echogenicity and atrophied similar to that seen on prior CT examination. Left Kidney: Renal measurements: 9.5 x 5.1 x 4.7 cm =  volume: 118 mL. No mass lesion or hydronephrosis is noted. No definitive calculi are seen. Bladder: Partially distended. Other: Note is made of mild  ascites as well as evidence of cholelithiasis. Electronically Signed   By: Inez Catalina M.D.   On: 11/05/2020 10:38   DG CHEST PORT 1 VIEW  Result Date: 11/08/2020 CLINICAL DATA:  Shortness of breath, COVID-19. EXAM: PORTABLE CHEST 1 VIEW COMPARISON:  11/07/2020 and CT chest 11/03/2020. FINDINGS: Patient is rotated. Trachea is midline. Heart is enlarged. Pleuroparenchymal scarring and calcification in the lateral and inferior right hemithorax. Lungs are hyperinflated without dense airspace consolidation or pleural fluid. IMPRESSION: 1. No acute findings. 2. Pleuroparenchymal scarring in the right hemithorax. 3. Basilar nodule seen in the right lower lobe on 11/03/2020 is not well appreciated. Electronically Signed   By: Lorin Picket M.D.   On: 11/08/2020 14:07   DG CHEST PORT 1 VIEW  Result Date: 11/07/2020 CLINICAL DATA:  Respiratory failure.  Reported COVID-19 positive EXAM: PORTABLE CHEST 1 VIEW COMPARISON:  Chest radiograph November 06, 2020; chest CT November 03, 2020 FINDINGS: Pleural calcification on the right is again noted. There is scarring in the right base. Scattered areas of fibrosis in the right mid and lower lung regions is stable. There is no appreciable edema or consolidation. Heart remains enlarged. The pulmonary vascularity is normal. No adenopathy. There is aortic atherosclerosis. No evident bone lesions. IMPRESSION: Areas of scarring/fibrosis on the right. Pleural calcification raises question of previous asbestos exposure. No edema or airspace opacity. Stable cardiomegaly. Aortic Atherosclerosis (ICD10-I70.0). Comment: Spiculated lesion right lower lung region seen on recent CT not appreciable by radiography. Electronically Signed   By: Lowella Grip III M.D.   On: 11/07/2020 10:15   DG CHEST PORT 1 VIEW  Result Date:  11/06/2020 CLINICAL DATA:  Shortness of breath.  COVID-19 positive EXAM: PORTABLE CHEST 1 VIEW COMPARISON:  Chest radiograph and chest CT November 03, 2020. Chest radiograph Feb 07, 2020 FINDINGS: There is pleural calcification on the right which may be indicative of previous asbestos exposure. There is chronic blunting of the right costophrenic angle consistent with scarring. There is mild scarring elsewhere in each lung base. There is fibrotic change in the right mid and lower lung regions, stable. There is no appreciable edema or consolidation. Heart is enlarged with pulmonary vascularity normal. No adenopathy. There is aortic atherosclerosis. No bone lesions. IMPRESSION: Pleural calcification on the right. Question previous asbestos exposure. Areas of fibrotic type change, primarily in the right lower lung region. Chronic blunting of the right costophrenic angle. Scarring in the bases. No edema or airspace opacity. Note that a somewhat spiculated area in the right lower lung region seen on recent CT is not appreciable by radiography. Stable cardiomegaly. No evident adenopathy. Aortic Atherosclerosis (ICD10-I70.0). Electronically Signed   By: Lowella Grip III M.D.   On: 11/06/2020 14:48   DG CHEST PORT 1 VIEW  Result Date: 11/03/2020 CLINICAL DATA:  Shortness of breath and respiratory failure secondary to COVID-19 pneumonia. EXAM: PORTABLE CHEST 1 VIEW COMPARISON:  10/30/2020 FINDINGS: Stable cardiac enlargement. There remains some density in the right chest which appears primarily related to the pleura with component of calcified pleural plaque suspected. It is difficult to exclude a component of a small lateral pneumothorax. Small bilateral pleural effusions are suspected. Probable mild infiltrate at the right lung base. Suggestion of pulmonary interstitial edema on the prior study has decreased. IMPRESSION: 1. Stable cardiac enlargement. Possible small bilateral pleural effusions. 2. Difficult to  exclude a small lateral pneumothorax on the right. Consider evaluation with CT of the chest to determine if there is a pneumothorax present. 3. Possible  mild infiltrate at the right lung base. 4. Decrease in pulmonary interstitial edema. Electronically Signed   By: Aletta Edouard M.D.   On: 11/03/2020 08:25   DG Chest Port 1 View  Result Date: 10/29/2020 CLINICAL DATA:  70 year old female with shortness of breath. EXAM: PORTABLE CHEST 1 VIEW COMPARISON:  Chest radiograph dated 03/28/2020. FINDINGS: There is cardiomegaly with vascular congestion. There is diffuse chronic interstitial coarsening in keeping with interstitial lung disease. There are small bilateral pleural effusions. Calcified right pleural plaque. Slight increased density at the right lung base may represent atelectasis. Developing infiltrate is not excluded. There is no pneumothorax. Atherosclerotic calcification of the aorta. No acute osseous pathology. IMPRESSION: 1. Cardiomegaly with vascular congestion and small bilateral pleural effusions. 2. Background of interstitial lung disease. 3. Right lung base atelectasis versus developing infiltrate. Electronically Signed   By: Anner Crete M.D.   On: 11/02/2020 15:42   VAS [place] LOWER EXTREMITY VENOUS (DVT)  Result Date: 12/03/2020  Lower Venous DVT Study Indications: Elevated Ddimer.  Risk Factors: COVID 19 positive. Comparison Study: No prior studies. Performing Technologist: Oliver Hum RVT  Examination Guidelines: A complete evaluation includes B-mode imaging, spectral Doppler, color Doppler, and power Doppler as needed of all accessible portions of each vessel. Bilateral testing is considered an integral part of a complete examination. Limited examinations for reoccurring indications may be performed as noted. The reflux portion of the exam is performed with the patient in reverse Trendelenburg.  +---------+---------------+---------+-----------+----------+--------------+ RIGHT     CompressibilityPhasicitySpontaneityPropertiesThrombus Aging +---------+---------------+---------+-----------+----------+--------------+ CFV      Full           Yes      Yes                                 +---------+---------------+---------+-----------+----------+--------------+ SFJ      Full                                                        +---------+---------------+---------+-----------+----------+--------------+ FV Prox  Full                                                        +---------+---------------+---------+-----------+----------+--------------+ FV Mid   Full                                                        +---------+---------------+---------+-----------+----------+--------------+ FV DistalFull                                                        +---------+---------------+---------+-----------+----------+--------------+ PFV      Full                                                        +---------+---------------+---------+-----------+----------+--------------+  POP      Full           Yes      Yes                                 +---------+---------------+---------+-----------+----------+--------------+ PTV      Full                                                        +---------+---------------+---------+-----------+----------+--------------+ PERO     Full                                                        +---------+---------------+---------+-----------+----------+--------------+   +---------+---------------+---------+-----------+----------+--------------+ LEFT     CompressibilityPhasicitySpontaneityPropertiesThrombus Aging +---------+---------------+---------+-----------+----------+--------------+ CFV      Full           Yes      Yes                                 +---------+---------------+---------+-----------+----------+--------------+ SFJ      Full                                                         +---------+---------------+---------+-----------+----------+--------------+ FV Prox  Full                                                        +---------+---------------+---------+-----------+----------+--------------+ FV Mid   Full                                                        +---------+---------------+---------+-----------+----------+--------------+ FV DistalFull                                                        +---------+---------------+---------+-----------+----------+--------------+ PFV      Full                                                        +---------+---------------+---------+-----------+----------+--------------+ POP      Full           Yes      Yes                                 +---------+---------------+---------+-----------+----------+--------------+  PTV      Full                                                        +---------+---------------+---------+-----------+----------+--------------+ PERO     Full                                                        +---------+---------------+---------+-----------+----------+--------------+     Summary: RIGHT: - There is no evidence of deep vein thrombosis in the lower extremity.  - No cystic structure found in the popliteal fossa.  LEFT: - There is no evidence of deep vein thrombosis in the lower extremity.  - No cystic structure found in the popliteal fossa.  *See table(s) above for measurements and observations. Electronically signed by Harold Barban MD on 11/26/20 at 9:51:33 PM.    Final    ECHOCARDIOGRAM LIMITED  Result Date: 11/03/2020    ECHOCARDIOGRAM LIMITED REPORT   Patient Name:   Erica [name] Date of Exam: 11/03/2020 Medical Rec #:  [id number]        Height:       64.0 in Accession #:    [id number]       Weight:       121.5 lb Date of Birth:  Apr 24, 1951        BSA:          1.583 m Patient Age:    63 years         BP:           108/76 mmHg Patient  Gender: F                HR:           107 bpm. Exam Location:  Inpatient Procedure: Limited Echo, Cardiac Doppler and Color Doppler Indications:    Elevated brain natriuretic peptide (BNP) level [id number]  History:        Patient has prior history of Echocardiogram examinations, most                 recent 03/28/2020. COPD; Risk Factors:Hypertension, Diabetes,                 Dyslipidemia and Current Smoker.  Sonographer:    Vickie Epley RDCS Referring Phys: [phone] A CALDWELL POWELL JR  Sonographer Comments: Covid positive. IMPRESSIONS  1. ? ascites noted on subcostoal images.  2. Left ventricular ejection fraction, by estimation, is 60 to 65%. The left ventricle has normal function. The left ventricle has no regional wall motion abnormalities. Left ventricular diastolic parameters were normal.  3. Marked cor pulmonale with flattened septum in both systole and diastole. Right ventricular systolic function is severely reduced. The right ventricular size is severely enlarged.  4. Right atrial size was severely dilated.  5. The mitral valve is normal in structure. Mild mitral valve regurgitation. No evidence of mitral stenosis.  6. Tricuspid valve regurgitation is severe.  7. The aortic valve is normal in structure. Aortic valve regurgitation is not visualized. No aortic stenosis is present.  8. The inferior vena cava is dilated in size with <50% respiratory variability, suggesting right atrial pressure of 15  mmHg. FINDINGS  Left Ventricle: Left ventricular ejection fraction, by estimation, is 60 to 65%. The left ventricle has normal function. The left ventricle has no regional wall motion abnormalities. The left ventricular internal cavity size was normal in size. There is  no left ventricular hypertrophy. Left ventricular diastolic parameters were normal. Right Ventricle: Marked cor pulmonale with flattened septum in both systole and diastole. The right ventricular size is severely enlarged. Right vetricular wall  thickness was not assessed. Right ventricular systolic function is severely reduced. Left Atrium: Left atrial size was normal in size. Right Atrium: Right atrial size was severely dilated. Pericardium: There is no evidence of pericardial effusion. Mitral Valve: The mitral valve is normal in structure. Mild mitral valve regurgitation. No evidence of mitral valve stenosis. Tricuspid Valve: The tricuspid valve is normal in structure. Tricuspid valve regurgitation is severe. No evidence of tricuspid stenosis. Aortic Valve: The aortic valve is normal in structure. Aortic valve regurgitation is not visualized. No aortic stenosis is present. Pulmonic Valve: The pulmonic valve was normal in structure. Pulmonic valve regurgitation is mild. No evidence of pulmonic stenosis. Aorta: The aortic root is normal in size and structure. Venous: The inferior vena cava is dilated in size with less than 50% respiratory variability, suggesting right atrial pressure of 15 mmHg. IAS/Shunts: No atrial level shunt detected by color flow Doppler. Additional Comments: ? ascites noted on subcostoal images. LEFT VENTRICLE PLAX 2D LVIDd:         3.90 cm     Diastology LVIDs:         3.40 cm     LV e' medial:    4.80 cm/s LV PW:         0.60 cm     LV E/e' medial:  12.6 LV IVS:        0.60 cm     LV e' lateral:   7.20 cm/s LVOT diam:     1.80 cm     LV E/e' lateral: 8.4 LV SV:         23 LV SV Index:   15 LVOT Area:     2.54 cm  LV Volumes (MOD) LV vol d, MOD A4C: 42.8 ml LV vol s, MOD A4C: 21.5 ml LV SV MOD A4C:     42.8 ml RIGHT VENTRICLE RV S prime:     5.62 cm/s TAPSE (M-mode): 0.7 cm LEFT ATRIUM         Index      RIGHT ATRIUM           Index LA diam:    2.80 cm 1.77 cm/m RA Area:     25.80 cm                                RA Volume:   87.60 ml  55.35 ml/m  AORTIC VALVE LVOT Vmax:   70.50 cm/s LVOT Vmean:  40.200 cm/s LVOT VTI:    0.091 m MITRAL VALVE               TRICUSPID VALVE MV Area (PHT): 4.31 cm    TR Peak grad:   51.0 mmHg MV  Decel Time: 176 msec    TR Vmax:        357.00 cm/s MR Peak grad: 80.6 mmHg MR Vmax:      449.00 cm/s  SHUNTS MV E velocity: 60.40 cm/s  Systemic VTI:  0.09 m MV A velocity:  65.60 cm/s  Systemic Diam: 1.80 cm MV E/A ratio:  0.92 Jenkins Rouge MD Electronically signed by Jenkins Rouge MD Signature Date/Time: 11/03/2020/3:30:32 PM    Final    US Abdomen Limited RUQ (LIVER/GB)  Result Date: 11/07/2020 CLINICAL DATA:  Elevated bilirubin.  COVID-19 infection. EXAM: ULTRASOUND ABDOMEN LIMITED RIGHT UPPER QUADRANT COMPARISON:  CT, 11/14/2020 FINDINGS: Gallbladder: Numerous stones with posterior acoustic shadowing limiting the assessment of the gallbladder. No wall thickening. No sonographic Strothers's sign. Common bile duct: Diameter: 6 mm Liver: Normal size. Coarsened echotexture. Nodular anterior contour. No masses. Portal vein is patent on color Doppler imaging with normal direction of blood flow towards the liver. Other: Small amount of ascites. IMPRESSION: 1. Numerous gallstones, but no evidence of acute cholecystitis. Common bile duct normal in caliber for age. 2. Sonographic appearance of the liver suggests cirrhosis. No liver masses. 3. Small amount of ascites. Electronically Signed   By: Lajean Manes M.D.   On: 11/07/2020 20:55    Microbiology: No results found for this or any previous visit (from the past 240 hour(s)).   Labs: Basic Metabolic Panel: Recent Labs  Lab 11/06/20 0431 11/07/20 0408 11/08/20 0148 11/09/20 0352 Nov 18, 2020 0425  NA 140 144 141  142 138 141  K 5.0 4.6 5.1  5.1 5.3* 5.6*  CL 95* 102 102  102 103 104  CO2 24 23 21*  21* 20* 16*  GLUCOSE 174* 93 88  88 89 111*  BUN 151* 133* 143*  112* 129* 105*  CREATININE 3.72* 3.36* 3.54*  3.49* 3.36* 3.26*  CALCIUM 7.3* 7.1* 7.2*  7.2* 7.6* 8.5*  MG 2.2 2.1 2.2 2.2 2.2  PHOS 6.2* 5.3* 5.7*  5.6* 5.6* 6.0*   Liver Function Tests: Recent Labs  Lab 11/07/20 0408 11/08/20 0148 11/09/20 0352 11/09/20 1318 November 18, 2020 0425   AST 189* 225* 247* 259* 158*  ALT 247* 279* 321* 354* 294*  ALKPHOS 73 86 63 78 68  BILITOT 2.2* 2.7* 3.0* 3.9* 4.1*  PROT 5.5* 5.6* 5.5* 6.0* 6.0*  ALBUMIN 3.1* 3.1*  3.1* 3.5 3.8 4.0   No results for input(s): LIPASE, AMYLASE in the last 168 hours. Recent Labs  Lab Nov 18, 2020 0425  AMMONIA 27   CBC: Recent Labs  Lab 11/07/20 0408 11/07/20 1707 11/08/20 0148 11/09/20 0352 11-18-2020 0425  WBC 6.4 9.7 10.0 8.3 11.5*  NEUTROABS 5.5 8.4* 8.6* 7.1 9.8*  HGB 12.4 12.9 13.3 12.9 13.5  HCT 37.4 39.4 40.7 38.4 40.0  MCV 85.2 86.4 86.6 85.0 85.5  PLT 78* 72* 63* 38* 42*   Cardiac Enzymes: No results for input(s): CKTOTAL, CKMB, CKMBINDEX, TROPONINI in the last 168 hours. D-Dimer Recent Labs    11/09/20 0352  DDIMER 2.01*   BNP: Invalid input(s): POCBNP CBG: Recent Labs  Lab 11/09/20 0754 11/09/20 1124 11/09/20 1630 11/09/20 2225 2020-11-18 0753  GLUCAP 82 93 114* 123* 108*   Anemia work up Recent Labs    11/09/20 0352 11/09/20 0748  VITAMINB12  --  4,554*  FOLATE  --  13.0  FERRITIN 223  --    Urinalysis    Component Value Date/Time   COLORURINE YELLOW 11/05/2020 1504   APPEARANCEUR HAZY (A) 11/05/2020 1504   LABSPEC 1.014 11/05/2020 1504   PHURINE 5.0 11/05/2020 1504   GLUCOSEU NEGATIVE 11/05/2020 1504   HGBUR NEGATIVE 11/05/2020 1504   BILIRUBINUR NEGATIVE 11/05/2020 1504   KETONESUR 5 (A) 11/05/2020 1504   PROTEINUR NEGATIVE 11/05/2020 1504   UROBILINOGEN 0.2 03/14/2013 1847   NITRITE  NEGATIVE 11/05/2020 1504   LEUKOCYTESUR NEGATIVE 11/05/2020 1504   Sepsis Labs Invalid input(s): PROCALCITONIN,  WBC,  LACTICIDVEN     SIGNED:  Dwyane Dee, MD  [facility] 11/11/2020, 4:47 PM

## 2020-11-23 DEATH — deceased
